# Patient Record
Sex: Female | Born: 1990 | Race: White | Hispanic: No | Marital: Married | Smoking: Never smoker
Health system: Southern US, Community
[De-identification: ages and names within clinical notes are randomized; demographics above are authoritative.]

## PROBLEM LIST (undated history)

## (undated) DIAGNOSIS — J45909 Unspecified asthma, uncomplicated: Secondary | ICD-10-CM

## (undated) DIAGNOSIS — Z9221 Personal history of antineoplastic chemotherapy: Secondary | ICD-10-CM

## (undated) DIAGNOSIS — F419 Anxiety disorder, unspecified: Secondary | ICD-10-CM

## (undated) DIAGNOSIS — I83813 Varicose veins of bilateral lower extremities with pain: Secondary | ICD-10-CM

## (undated) DIAGNOSIS — Z809 Family history of malignant neoplasm, unspecified: Secondary | ICD-10-CM

## (undated) DIAGNOSIS — C801 Malignant (primary) neoplasm, unspecified: Secondary | ICD-10-CM

## (undated) DIAGNOSIS — C50919 Malignant neoplasm of unspecified site of unspecified female breast: Secondary | ICD-10-CM

## (undated) DIAGNOSIS — F32A Depression, unspecified: Secondary | ICD-10-CM

## (undated) HISTORY — DX: Family history of malignant neoplasm, unspecified: Z80.9

## (undated) HISTORY — DX: Varicose veins of bilateral lower extremities with pain: I83.813

## (undated) HISTORY — DX: Malignant neoplasm of unspecified site of unspecified female breast: C50.919

---

## 2014-08-06 ENCOUNTER — Other Ambulatory Visit: Payer: Self-pay | Admitting: Family Medicine

## 2014-08-06 DIAGNOSIS — F411 Generalized anxiety disorder: Secondary | ICD-10-CM | POA: Insufficient documentation

## 2014-08-06 DIAGNOSIS — I8390 Asymptomatic varicose veins of unspecified lower extremity: Secondary | ICD-10-CM | POA: Insufficient documentation

## 2014-10-16 LAB — MONO SLIDE WITH REFLEX TO EBVP: Mono (Heterophile): NEGATIVE

## 2015-09-06 ENCOUNTER — Ambulatory Visit
Admission: AD | Admit: 2015-09-06 | Discharge: 2015-09-06 | Disposition: A | Discharge status: Home / Self Care | Payer: Self-pay | Attending: Family Medicine | Admitting: Family Medicine

## 2015-09-06 ENCOUNTER — Encounter: Payer: Self-pay | Admitting: Nurse Practitioner

## 2015-09-06 DIAGNOSIS — R197 Diarrhea, unspecified: Secondary | ICD-10-CM

## 2015-09-06 DIAGNOSIS — A084 Viral intestinal infection, unspecified: Secondary | ICD-10-CM

## 2015-09-06 DIAGNOSIS — R109 Unspecified abdominal pain: Secondary | ICD-10-CM

## 2015-09-06 LAB — HM HIV SCREENING OFFERED

## 2015-09-06 LAB — POCT URINE PREGNANCY: Lot #: 161321

## 2015-09-06 LAB — POCT URINALYSIS DIPSTICK
Glucose,UA POCT: NORMAL
Leuk Esterase,UA POCT: 1 — AB
Lot #: 20526703
Nitrite,UA POCT: NEGATIVE
PH,UA POCT: 5 (ref 5–8)
Specific gravity,UA POCT: 1.015 (ref 1.002–1.03)

## 2015-09-06 MED ORDER — DIPHENOXYLATE-ATROPINE 2.5-0.025 MG PO TABS *I*
1.0000 | ORAL_TABLET | Freq: Four times a day (QID) | ORAL | 0 refills | Status: DC | PRN
Start: 2015-09-06 — End: 2015-09-06

## 2015-09-06 MED ORDER — DIPHENOXYLATE-ATROPINE 2.5-0.025 MG PO TABS *I*
1.0000 | ORAL_TABLET | Freq: Four times a day (QID) | ORAL | 0 refills | Status: AC | PRN
Start: 2015-09-06 — End: 2015-09-09

## 2015-09-06 NOTE — UC Provider Note (Signed)
History     Chief Complaint   Patient presents with    Diarrhea     Pt. c/o diarrhea for the past 2 days whenever she eats/drinks anything     Patient is a 24 y.o. female presenting with GI illness.   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 Primary symptoms do not include fever, fatigue, nausea, vomiting or dysuria. The illness began 2 days ago. The onset was sudden. The problem has not changed since onset.  The abdominal pain began 2 days ago. The abdominal pain is generalized. The abdominal pain does not radiate. The abdominal pain is relieved by nothing.   The diarrhea began 2 days ago. The diarrhea is watery. The diarrhea occurs 5 to 10 times per day.   The illness does not include chills, anorexia, dysphagia, bloating, back pain or itching. Associated medical issues do not include GERD.       History reviewed. No pertinent past medical history.         History reviewed. No pertinent past surgical history.    History reviewed. No pertinent family history.      Social History    reports that she has never smoked. She does not have any smokeless tobacco history on file. Her alcohol, drug, and sexual activity histories are not on file.    Living Situation     Questions Responses    Patient lives with     Homeless     Caregiver for other family member     External Services     Employment     Domestic Violence Risk           Review of Systems   Review of Systems   Constitutional: Negative for activity change, appetite change, chills, fatigue and fever.   Gastrointestinal: Positive for abdominal pain and diarrhea. Negative for abdominal distention, anorexia, bloating, dysphagia, nausea and vomiting.   Genitourinary: Negative for difficulty urinating, dysuria, flank pain and vaginal discharge.   Musculoskeletal: Negative for back pain.   Skin:  Negative for itching.   Neurological: Negative for dizziness and headaches.   Psychiatric/Behavioral: Negative for agitation, behavioral problems and confusion.       Physical Exam     ED Triage Vitals   BP Heart Rate Heart Rate (via Pulse Ox) Resp Temp Temp src SpO2 O2 Device O2 Flow Rate   09/06/15 1030 09/06/15 1030 -- 09/06/15 1030 09/06/15 1030 09/06/15 1030 09/06/15 1030 09/06/15 1030 --   124/87 80  18 36.6 C (97.8 F) TEMPORAL 99 % None (Room air)       Weight           --                          Physical Exam   Constitutional: She appears well-developed and well-nourished.   Pulmonary/Chest: Effort normal and breath sounds normal.   Abdominal: Soft. Bowel sounds are normal. She exhibits no distension. There is generalized tenderness. There is no rigidity, no rebound, no guarding, no CVA tenderness, no tenderness at McBurney's point and negative Murphy's sign.   Psychiatric: She has a normal mood and affect. Her behavior is normal. Judgment and thought content normal.   Vitals reviewed.       Medical  Decision Making        Initial Evaluation:  ED First Provider Contact     Date/Time Event User Comments    09/06/15 1048 ED Provider First Contact [place]O, Tajae Rybicki Initial Face to Face Provider Contact          Patient seen by me on arrival date of 09/06/2015 at at time of arrival  10:19 AM.  Initial face to face evaluation time noted above may be discrepant due to patient acuity and delay in documentation.    Assessment:  24 y.o.female comes [facility] the [facility] with diarrhea    Differential Diagnosis includes   Appendicitis  Diverticulitis  Diverticulosis  AVM  Mesenteric ischemia  Gastroenteritis  GERD  Pseudomembranous colitis    Plan:   Orders Placed This Encounter    Bacterial urine culture    POCT urinalysis dipstick    POCT urine pregnancy    HM HIV SCREENING OFFERED    diphenoxylate-atropine (LOMOTIL) 2.5-0.025 MG per tablet   Pregnancy - Urine Pregnancy test ordered to guide diagnostics  and/or medication therapy in patient of child bearing age  Urine sent for culture secondary to one plus leuk.  Possible asymptomatic bacteriuria.    Final Diagnosis    ICD-10-CM ICD-9-CM   1. Abdominal cramping R10.9 789.00   2. Diarrhea, unspecified type R19.7 787.91   3. Viral gastroenteritis A08.4 008.8       Encourage fluids, encourage rest, good hand hygiene.    Use over the counter medications as discussed.    Please start the new medications as below:    Current Discharge Medication List      New Medications    Details Last Dose Given Next Dose Due Script Given?   diphenoxylate-atropine (LOMOTIL) 1 tablet Dose: 1 tablet  Take 1 tablet by mouth 4 times daily as needed for Diarrhea    Quantity 12 tablet, Refill 0  Start date: 09/06/2015, End date: 09/09/2015                   Please follow up with your physician as below:    Follow-up Information     Follow up with Jacqulyn LinerFoote, Michael M, MD.    Why:  As needed    Contact information:    [address]  [address]  [place] [id number]  [phone]          If short of breath, chest pains or any other concerns please report to the emergency room.    In the event of an Emergency dial 911.      Final Diagnosis  Final diagnoses:   [R10.9] Abdominal cramping (Primary)   [R19.7] Diarrhea, unspecified type   [A08.4] Viral gastroenteritis           Lissa MoralesMATTHEW Mane Consolo, MD       Lissa Moralesapuano, Peg Fifer, MD  09/06/15 1118

## 2015-09-07 LAB — AEROBIC CULTURE: Aerobic Culture: 0

## 2015-10-03 ENCOUNTER — Other Ambulatory Visit: Payer: Self-pay | Admitting: Emergency Medicine

## 2015-10-03 LAB — URINALYSIS WITH REFLEX TO MICROSCOPIC
Bilirubin,Ur: NEGATIVE
Blood,UA: NEGATIVE
Glucose, Ur: NEGATIVE mg/dL^mg/dL
Ketones, UA: NEGATIVE mg/dL^mg/dL
Leuk Esterase,UA: NEGATIVE
Nitrite,UA: NEGATIVE
Protein,UA: NEGATIVE mg/dL^mg/dL
Specific Gravity,UA: 1.003 — ABNORMAL LOW (ref 1.005–1.030)
Urobilinogen,UA: 0.2 mg/dL^mg/dL
pH: 6.5 (ref 5.0–8.0)

## 2015-10-03 LAB — COMPREHENSIVE METABOLIC PANEL
ALT: 29 U/L^U/L (ref 12–78)
AST: 22 U/L^U/L (ref 11–37)
Albumin: 3.9 g/dL^g/dL (ref 3.4–5.0)
Alk Phos: 56 U/L^U/L (ref 45–117)
Anion Gap: 6 meq/L^meq/L (ref 3–11)
Bilirubin,Total: 0.5 mg/dL^mg/dL (ref 0.2–1.0)
CO2: 28 meq/L^meq/L (ref 21–32)
Calcium: 8.6 mg/dL^mg/dL (ref 8.5–10.1)
Chloride: 109 meq/L^meq/L — ABNORMAL HIGH (ref 98–107)
Creatinine: 0.8 mg/dL^mg/dL (ref 0.6–1.3)
GFR,Black: 106 mL/min/{1.73_m2}
GFR,Caucasian: 87 mL/min/{1.73_m2}
Glucose: 76 mg/dL^mg/dL (ref 70–100)
Lab: 14 mg/dL^mg/dL (ref 7–18)
Potassium: 3.8 meq/L^meq/L (ref 3.5–5.1)
Sodium: 143 meq/L^meq/L (ref 136–145)
Total Protein: 7 g/dL^g/dL (ref 6.4–8.2)

## 2015-10-03 LAB — CBC AND DIFFERENTIAL
Baso # K/uL: 0.1 10*3/uL (ref 0.0–0.1)
Basophil %: 0.7 %^% (ref 0.1–1.2)
Eos # K/uL: 0.1 10*3/uL (ref 0.0–0.4)
Eosinophil %: 1.1 %^% (ref 0.7–5.8)
Hematocrit: 38.5 %^% (ref 34.1–44.9)
Hemoglobin: 13.5 g/dL^g/dL (ref 11.2–15.7)
Immature Granulocytes Absolute: 0.03 10*3/uL (ref 0.0–0.2)
Immature Granulocytes: 0.3 %^% (ref 0.0–2.0)
Lymph # K/uL: 2.6 10*3/uL (ref 1.2–3.7)
Lymphocyte %: 29.6 %^% (ref 19.3–51.7)
MCH: 31.6 pg^pg (ref 25.6–32.2)
MCHC: 35.1 g/dL^g/dL (ref 32.2–35.5)
MCV: 90.2 fL^fL (ref 79.4–94.8)
Mono # K/uL: 0.6 10*3/uL (ref 0.2–0.9)
Monocyte %: 6.3 %^% (ref 4.7–12.5)
Neut # K/uL: 5.5 10*3/uL (ref 1.6–6.0)
Nucl RBC # K/uL: 0 /100 WBC^/100 WBC (ref 0.0–0.2)
Platelets: 178 10*3/uL — ABNORMAL LOW (ref 182–369)
RBC Distribution Width-SD: 40.1 fL^fL (ref 36.4–46.3)
RBC: 4.27 10*6/uL (ref 3.93–5.22)
RDW: 12.3 %^% (ref 11.7–14.4)
Seg Neut %: 62 %^% (ref 34.0–71.1)
WBC: 8.8 10*3/uL (ref 4.0–10.0)

## 2015-10-03 LAB — INR FFT: INR: 1.04 (ref 0.86–1.16)

## 2015-10-03 LAB — LIPASE: Lipase: 111 U/L^U/L (ref 73–393)

## 2015-10-03 LAB — TYPE AND SCREEN
ABO RH Blood Type: O POS
Antibody Screen: NEGATIVE

## 2015-10-03 LAB — APTT: aPTT: 32.4 seconds^seconds (ref 26.8–34.4)

## 2015-10-03 LAB — ALCOHOL SCREEN: Ethanol: 141 mg/dL^mg/dL — ABNORMAL HIGH (ref 0–3)

## 2015-10-03 LAB — PREGNANCY TEST, SERUM: Preg,Serum: NEGATIVE

## 2016-07-07 ENCOUNTER — Other Ambulatory Visit: Payer: Self-pay | Admitting: Family Medicine

## 2016-07-07 LAB — COMPREHENSIVE METABOLIC PANEL
ALT: 15 U/L (ref 0–35)
AST: 20 U/L (ref 0–35)
Albumin: 3.9 g/dL (ref 3.5–5.2)
Alk Phos: 43 U/L (ref 35–105)
Anion Gap: 18 ^^L — ABNORMAL HIGH (ref 7–16)
Bilirubin,Total: 0.2 mg/dL (ref 0.0–1.2)
CO2: 16 mmol/L — ABNORMAL LOW (ref 20–28)
Calcium: 8.7 mg/dL — ABNORMAL LOW (ref 8.8–10.2)
Chloride: 106 mmol/L (ref 96–108)
Creatinine: 0.64 mg/dL (ref 0.51–0.95)
GFR,Black: 136 mL/min/{1.73_m2}
GFR,Caucasian: 112 mL/min/{1.73_m2}
Glucose: 121 mg/dL — ABNORMAL HIGH (ref 60–99)
Lab: 11 mg/dL (ref 6–20)
Potassium: 4.2 mmol/L (ref 3.4–4.7)
Sodium: 140 mmol/L (ref 133–145)
Total Protein: 6.7 g/dL (ref 6.3–7.7)

## 2016-07-07 LAB — CBC AND DIFFERENTIAL
Baso # K/uL: 0.1 10*3/uL (ref 0.0–0.1)
Basophil %: 0.8 % (ref 0.1–1.2)
Eos # K/uL: 0.1 10*3/uL (ref 0.0–0.4)
Eosinophil %: 1.9 % (ref 0.7–5.8)
Hematocrit: 37.7 % (ref 34.1–44.9)
Hemoglobin: 13.1 g/dL (ref 11.2–15.7)
Immature Granulocytes Absolute: 0.01 10*3/uL (ref 0.0–0.2)
Immature Granulocytes: 0.2 % (ref 0.0–2.0)
Lymph # K/uL: 1.4 10*3/uL (ref 1.2–3.7)
Lymphocyte %: 22.4 % (ref 19.3–51.7)
MCH: 31.5 pg (ref 25.6–32.2)
MCHC: 34.7 g/dL (ref 32.2–35.5)
MCV: 90.6 fL (ref 79.4–94.8)
Mono # K/uL: 0.4 10*3/uL (ref 0.2–0.9)
Monocyte %: 5.7 % (ref 4.7–12.5)
Neut # K/uL: 4.4 10*3/uL (ref 1.6–6.0)
Nucl RBC # K/uL: 0 /100 WBC (ref 0.0–0.2)
Platelets: 197 10*3/uL (ref 182–369)
RBC Distribution Width-SD: 43.8 fL (ref 36.4–46.3)
RBC: 4.16 10*6/uL (ref 3.93–5.22)
RDW: 13.1 % (ref 11.7–14.4)
Seg Neut %: 69 % (ref 34.0–71.1)
WBC: 6.3 10*3/uL (ref 4.0–10.0)

## 2016-07-07 LAB — SEDIMENTATION RATE, AUTOMATED: Sedimentation Rate: 2 mm/hr (ref 0–20)

## 2016-07-07 LAB — TSH: TSH: 1.28 u[IU]/mL (ref 0.27–4.20)

## 2016-07-07 LAB — T4, FREE: Free T4: 1.01 ng/dL (ref 0.90–1.70)

## 2016-07-09 LAB — ANTINUCLEAR ANTIBODY SCREEN: ANA Screen: NEGATIVE ^^L

## 2016-07-10 LAB — VITAMIN D
25-OH VIT D2: <4 ng/mL
25-OH VIT D3: 39 ng/mL
25-OH Vit Total: 39 ng/mL (ref 30–60)

## 2016-08-31 ENCOUNTER — Ambulatory Visit
Admission: AD | Admit: 2016-08-31 | Discharge: 2016-08-31 | Disposition: A | Discharge status: Home / Self Care | Payer: Self-pay

## 2016-08-31 DIAGNOSIS — K529 Noninfective gastroenteritis and colitis, unspecified: Secondary | ICD-10-CM

## 2016-08-31 MED ORDER — ONDANSETRON HCL 4 MG PO TABS *I*
4.0000 mg | ORAL_TABLET | Freq: Three times a day (TID) | ORAL | 0 refills | Status: DC | PRN
Start: 2016-08-31 — End: 2016-12-15

## 2016-08-31 NOTE — UC Provider Note (Signed)
History     Chief Complaint   Patient presents with    Emesis     Vomiting, sweats, nausea last night. States nosymptoms toda. States "I am just looking for a doctors note for work".     Patient is a 25 y.o. female presenting with vomiting.   History provided by:  Patient  Language interpreter used: No    Emesis   Severity:  Mild  Duration:  12 hours  Timing:  Intermittent  Quality:  Stomach contents  Able to tolerate:  Liquids  Progression:  Improving  Chronicity:  New  Recent urination:  Normal  Relieved by:  None tried  Worsened by:  Nothing  Associated symptoms: headaches    Associated symptoms: no abdominal pain, no arthralgias, no chills, no cough, no diarrhea, no fever, no myalgias, no sore throat and no URI    Risk factors: suspect food intake (patient ate a mushroom stew yesterday that she suspects made her sick)    LMP was normal.     History reviewed. No pertinent past medical history.         History reviewed. No pertinent surgical history.    History reviewed. No pertinent family history.      Social History    reports that she has never smoked. She has never used smokeless tobacco. Her alcohol, drug, and sexual activity histories are not on file.    Living Situation     Questions Responses    Patient lives with     Homeless     Caregiver for other family member     External Services     Employment Employed    Domestic Violence Risk           Review of Systems   Review of Systems   Constitutional: Negative for chills, diaphoresis, fatigue and fever.   HENT: Negative for sore throat.    Respiratory: Negative for cough.    Gastrointestinal: Positive for nausea and vomiting. Negative for abdominal pain and diarrhea.   Genitourinary: Negative for decreased urine volume, difficulty urinating and menstrual problem.   Musculoskeletal: Negative for arthralgias and myalgias.   Neurological: Positive for headaches. Negative for dizziness and light-headedness.   All other systems reviewed and are  negative.      Physical Exam     ED Triage Vitals   BP Heart Rate Heart Rate (via Pulse Ox) Resp Temp Temp src SpO2 (Retired) O2 Device O2 Flow Rate   08/31/16 1150 08/31/16 1150 -- 08/31/16 1150 08/31/16 1150 -- 08/31/16 1150 -- --   109/59 67  18 36.6 C (97.9 F)  99 %        Weight           --                               Physical Exam   Constitutional: She is oriented to person, place, and time. She appears well-developed and well-nourished. No distress.   HENT:   Head: Normocephalic and atraumatic.   Mouth/Throat: Oropharynx is clear and moist.   Eyes: Conjunctivae and EOM are normal. Pupils are equal, round, and reactive to light.   Neck: Normal range of motion. Neck supple. No JVD present.   Cardiovascular: Normal rate, regular rhythm, normal heart sounds and intact distal pulses.  Exam reveals no gallop and no friction rub.    No murmur heard.  Pulmonary/Chest: Effort normal and  breath sounds normal. No stridor. No respiratory distress.   Abdominal: Soft. Bowel sounds are normal. She exhibits no distension and no mass. There is no tenderness. There is no rebound, no guarding and no CVA tenderness.   Lymphadenopathy:     She has no cervical adenopathy.   Neurological: She is alert and oriented to person, place, and time.   Skin: Skin is warm and dry. No pallor.   Psychiatric: She has a normal mood and affect. Her behavior is normal. Judgment and thought content normal.   Nursing note and vitals reviewed.       Medical Decision Making        Initial Evaluation:  ED First Provider Contact     Date/Time Event User Comments    08/31/16 1153 ED First Provider Contact Alysia Penna M Initial Face to Face Provider Contact          Patient was seen on: 08/31/2016        Assessment:  25 y.o.female comes to the Urgent Care Center with nausea and vomiting x 12 hours after eating a mushroom stew for dinner.     Differential Diagnosis includes gastroenteritis, pregnancy, IBD/IBS, dehydration                Plan:   Orders  Placed This Encounter    ondansetron (ZOFRAN) 4 MG tablet       No results found for this or any previous visit (from the past 24 hour(s)).       Final Diagnosis    ICD-10-CM ICD-9-CM   1. Gastroenteritis K52.9 558.9       Encourage fluids, encourage rest, good hand hygiene.    Use over the counter medications as discussed.     Discharge Instructions    References/Attachments: Go to References/Attachments    COLITIS (ENGLISH) View    FOOD CHOICES TO HELP RELIEVE DIARRHEA, ADULT (ENGLISH) View    CLEAR LIQUID DIET (ENGLISH) View    Patient's Language: English   Instructions      Recommend otc Tylenol and/or Motrin as needed for pain/fever relief. Use as directed on package labeling.     Recommend OTC Pepto bismol.         Work excuse x today.    Please start the new medications as below:    Current Discharge Medication List      New Medications    Details Last Dose Given Next Dose Due Script Given?   ondansetron (ZOFRAN) 4 mg Dose: 4 mg  Take 4 mg by mouth 3 times daily as needed for Nausea    Quantity 15 tablet, Refill 0  Start date: 08/31/2016       Comments: Emergency Encounter                   Please follow up with your physician as below:    Follow-up Information     Follow up with Jacqulyn Liner, MD In 1 day.    Specialty:  Family Medicine    Why:  If symptoms worsen, If symptoms do not improve    Contact information:    [address]  [address]  [place] [address]  [phone]          If short of breath, chest pains, worsening symptoms, or any other concerns please report to the emergency room.    In the event of an Emergency dial 911.     Final Diagnosis  Final diagnoses:   [B14.9]  Gastroenteritis (Primary)           Concha PyoLeah M Delorese Sellin, [place]    Supervising physician Dorna BloomSuzanne Brendze, MD was immediately available     Concha PyoRuiz, Keegen Heffern M, [place]  08/31/16 1214

## 2016-08-31 NOTE — Discharge Instructions (Signed)
Recommend otc Tylenol and/or Motrin as needed for pain/fever relief. Use as directed on package labeling.     Recommend OTC Pepto bismol.

## 2016-09-06 ENCOUNTER — Telehealth: Payer: Self-pay

## 2016-09-06 NOTE — Telephone Encounter (Signed)
[facility]     Visit location: [facility]  Date of service: 08/31/2016    Condition of patient: N/A    Patient seeking follow-up with PCP or specialist: N/A    Prescriptions filled: n/a    Did ED/UC staff take the time to listen to patient concerns: n/a    Did ED/UC staff keep the patient informed: n/a    Other questions/concerns voiced by patient: N/A    MyChart-offer to assist patient with instructions: n/a    Rocky Morelachel M Cylie Dor on 09/06/2016 at 12:05 PM      PAST 5 DAY CALL BACK PERIOD

## 2016-10-05 HISTORY — PX: APPENDECTOMY: SHX54

## 2016-10-29 ENCOUNTER — Observation Stay
Admission: EM | Admit: 2016-10-29 | Disposition: A | Discharge status: Home / Self Care | Payer: Self-pay | Source: Ambulatory Visit | Attending: Surgery | Admitting: Surgery

## 2016-10-29 ENCOUNTER — Encounter
Admission: EM | Disposition: A | Discharge status: Home / Self Care | Payer: Self-pay | Source: Ambulatory Visit | Attending: Surgery

## 2016-10-29 ENCOUNTER — Encounter: Payer: Self-pay | Admitting: Anesthesiology

## 2016-10-29 ENCOUNTER — Other Ambulatory Visit: Payer: Self-pay | Admitting: Emergency Medicine

## 2016-10-29 ENCOUNTER — Encounter: Payer: Self-pay | Admitting: Emergency Medicine

## 2016-10-29 LAB — CBC AND DIFFERENTIAL
Baso # K/uL: 0.1 10*3/uL (ref 0.0–0.1)
Basophil %: 0.3 %
Eos # K/uL: 0.1 10*3/uL (ref 0.0–0.4)
Eosinophil %: 0.6 %
Hematocrit: 41 % (ref 34–45)
Hemoglobin: 14 g/dL (ref 11.2–15.7)
IMM Granulocytes #: 0.1 10*3/uL (ref 0.0–0.1)
IMM Granulocytes: 0.3 %
Lymph # K/uL: 1.2 10*3/uL (ref 1.2–3.7)
Lymphocyte %: 7.4 %
MCH: 32 pg/cell (ref 26–32)
MCHC: 34 g/dL (ref 32–36)
MCV: 92 fL (ref 79–95)
Mono # K/uL: 1 10*3/uL — ABNORMAL HIGH (ref 0.2–0.9)
Monocyte %: 6.3 %
Neut # K/uL: 13.2 10*3/uL — ABNORMAL HIGH (ref 1.6–6.1)
Nucl RBC # K/uL: 0 10*3/uL (ref 0.0–0.0)
Nucl RBC %: 0 /100 WBC (ref 0.0–0.2)
Platelets: 204 10*3/uL (ref 160–370)
RBC: 4.5 MIL/uL (ref 3.9–5.2)
RDW: 12.8 % (ref 11.7–14.4)
Seg Neut %: 85.1 %
WBC: 15.5 10*3/uL — ABNORMAL HIGH (ref 4.0–10.0)

## 2016-10-29 LAB — RUQ PANEL (ED ONLY)
ALT: 27 U/L (ref 0–35)
Albumin: 4.8 g/dL (ref 3.5–5.2)
Alk Phos: 44 U/L (ref 35–105)
Amylase: 49 U/L (ref 28–100)
Bilirubin,Direct: <0.2 mg/dL (ref 0.0–0.3)
Bilirubin,Total: 0.9 mg/dL (ref 0.0–1.2)
Lipase: 20 U/L (ref 13–60)
Total Protein: 7.6 g/dL (ref 6.3–7.7)

## 2016-10-29 LAB — URINALYSIS REFLEX TO CULTURE
Blood,UA: NEGATIVE
Ketones, UA: NEGATIVE
Leuk Esterase,UA: NEGATIVE
Nitrite,UA: NEGATIVE
Protein,UA: NEGATIVE mg/dL
Specific Gravity,UA: 1.013 (ref 1.002–1.030)
pH,UA: 7 (ref 5.0–8.0)

## 2016-10-29 LAB — BASIC METABOLIC PANEL
Anion Gap: 16 (ref 7–16)
CO2: 22 mmol/L (ref 20–28)
Calcium: 9.6 mg/dL (ref 8.8–10.2)
Chloride: 100 mmol/L (ref 96–108)
Creatinine: 0.58 mg/dL (ref 0.51–0.95)
GFR,Black: 146 *
GFR,Caucasian: 127 *
Glucose: 83 mg/dL (ref 60–99)
Lab: 9 mg/dL (ref 6–20)
Sodium: 138 mmol/L (ref 133–145)

## 2016-10-29 LAB — POCT URINE PREGNANCY

## 2016-10-29 LAB — POCT URINALYSIS DIPSTICK
Bilirubin,Ur: NEGATIVE
Blood,UA POCT: NEGATIVE
Glucose,UA POCT: NORMAL mg/dL
Ketones,UA POCT: NEGATIVE mg/dL
Leuk Esterase,UA POCT: NEGATIVE
Lot #: 21650803
Nitrite,UA POCT: NEGATIVE
PH,UA POCT: 8 (ref 5–8)
Protein,UA POCT: NEGATIVE mg/dL
Specific gravity,UA POCT: 1.005 (ref 1.002–1.030)
Urobilinogen,UA: NORMAL mg/dL

## 2016-10-29 LAB — HOLD GREEN WITH GEL

## 2016-10-29 LAB — POCT GLUCOSE: Glucose POCT: 86 mg/dL (ref 60–99)

## 2016-10-29 LAB — HEMOLYZED TEST

## 2016-10-29 SURGERY — APPENDECTOMY, LAPAROSCOPIC
Anesthesia: General | Site: Abdomen | Wound class: Clean Contaminated

## 2016-10-29 MED ORDER — FENTANYL CITRATE 50 MCG/ML IJ SOLN *WRAPPED*
INTRAMUSCULAR | Status: AC
Start: 2016-10-29 — End: 2016-10-29
  Filled 2016-10-29: qty 2

## 2016-10-29 MED ORDER — SODIUM CHLORIDE 0.9 % IV BOLUS *I*
500.0000 mL | Freq: Once | Status: DC
Start: 2016-10-29 — End: 2016-10-29

## 2016-10-29 MED ORDER — HYDROMORPHONE HCL 2 MG/ML IJ SOLN *WRAPPED*
0.5000 mg | INTRAMUSCULAR | Status: DC | PRN
Start: 2016-10-29 — End: 2016-10-30

## 2016-10-29 MED ORDER — SUGAMMADEX SODIUM 100 MG/1ML IV SOLN *WRAPPED*
INTRAVENOUS | Status: AC
Start: 2016-10-29 — End: 2016-10-29
  Filled 2016-10-29: qty 2

## 2016-10-29 MED ORDER — HYDROMORPHONE HCL 2 MG/ML IJ SOLN *WRAPPED*
0.5000 mg | INTRAMUSCULAR | Status: DC | PRN
Start: 2016-10-29 — End: 2016-10-30
  Administered 2016-10-29: 0.5 mg via INTRAVENOUS
  Filled 2016-10-29: qty 1

## 2016-10-29 MED ORDER — SODIUM CHLORIDE 0.9 % IV BOLUS *I*
1000.0000 mL | Freq: Once | Status: AC
Start: 2016-10-29 — End: 2016-10-29
  Administered 2016-10-29: 1000 mL via INTRAVENOUS

## 2016-10-29 MED ORDER — AZITHROMYCIN 250 MG PO TABS *I*
1000.0000 mg | ORAL_TABLET | Freq: Once | ORAL | Status: AC
Start: 2016-10-29 — End: 2016-10-29
  Administered 2016-10-29: 1000 mg via ORAL
  Filled 2016-10-29: qty 4

## 2016-10-29 MED ORDER — FENTANYL CITRATE 50 MCG/ML IJ SOLN *WRAPPED*
INTRAMUSCULAR | Status: DC | PRN
Start: 2016-10-29 — End: 2016-10-29
  Administered 2016-10-29: 23:00:00 25 ug via INTRAVENOUS
  Administered 2016-10-29: 22:00:00 100 ug via INTRAVENOUS
  Administered 2016-10-29: 50 ug via INTRAVENOUS
  Administered 2016-10-29: 25 ug via INTRAVENOUS

## 2016-10-29 MED ORDER — CEFTRIAXONE IN LIDOCAINE 1% (350MG/ML) 250 MG *I*
250.0000 mg | Freq: Once | INTRAMUSCULAR | Status: AC
Start: 2016-10-29 — End: 2016-10-29
  Administered 2016-10-29: 250 mg via INTRAMUSCULAR
  Filled 2016-10-29: qty 0.71

## 2016-10-29 MED ORDER — SODIUM CHLORIDE 0.9 % IV SOLN WRAPPED *I*
2000.0000 mg | INTRAVENOUS | Status: AC
Start: 2016-10-29 — End: 2016-10-29
  Administered 2016-10-29: 2000 mg via INTRAVENOUS
  Filled 2016-10-29: qty 2

## 2016-10-29 MED ORDER — LACTATED RINGERS IV SOLN *I*
INTRAVENOUS | Status: DC | PRN
Start: 2016-10-29 — End: 2016-10-29

## 2016-10-29 MED ORDER — ONDANSETRON HCL 2 MG/ML IV SOLN *I*
4.0000 mg | Freq: Four times a day (QID) | INTRAMUSCULAR | Status: DC | PRN
Start: 2016-10-29 — End: 2016-10-30

## 2016-10-29 MED ORDER — FENTANYL CITRATE 50 MCG/ML IJ SOLN *WRAPPED*
100.0000 ug | Freq: Once | INTRAMUSCULAR | Status: AC
Start: 2016-10-29 — End: 2016-10-29
  Administered 2016-10-29: 15:00:00 100 ug via INTRAVENOUS
  Filled 2016-10-29: qty 2

## 2016-10-29 MED ORDER — CEFOXITIN SODIUM 1 GM IV SOLR *I*
2000.0000 mg | Freq: Once | INTRAVENOUS | Status: AC
Start: 2016-10-29 — End: 2016-10-29
  Administered 2016-10-29: 2000 mg via INTRAVENOUS
  Filled 2016-10-29: qty 2

## 2016-10-29 MED ORDER — DEXAMETHASONE SODIUM PHOSPHATE 4 MG/ML INJ SOLN *WRAPPED*
INTRAMUSCULAR | Status: DC | PRN
Start: 2016-10-29 — End: 2016-10-29
  Administered 2016-10-29: 8 mg via INTRAVENOUS

## 2016-10-29 MED ORDER — KETOROLAC TROMETHAMINE 30 MG/ML IJ SOLN *I*
30.0000 mg | Freq: Once | INTRAMUSCULAR | Status: AC
Start: 2016-10-29 — End: 2016-10-29
  Administered 2016-10-29: 30 mg via INTRAVENOUS
  Filled 2016-10-29: qty 1

## 2016-10-29 MED ORDER — SUCCINYLCHOLINE CHLORIDE 20 MG/ML IV/IJ SOLN *WRAPPED*
Status: DC | PRN
Start: 2016-10-29 — End: 2016-10-29
  Administered 2016-10-29: 100 mg via INTRAVENOUS

## 2016-10-29 MED ORDER — ROCURONIUM BROMIDE 10 MG/ML IV SOLN *WRAPPED*
Status: DC | PRN
Start: 2016-10-29 — End: 2016-10-29
  Administered 2016-10-29: 50 mg via INTRAVENOUS

## 2016-10-29 MED ORDER — MIDAZOLAM HCL 1 MG/ML IJ SOLN *I* WRAPPED
INTRAMUSCULAR | Status: AC
Start: 2016-10-29 — End: 2016-10-29
  Filled 2016-10-29: qty 2

## 2016-10-29 MED ORDER — BUPIVACAINE HCL 0.5 % IJ SOLUTION *WRAPPED*
INTRAMUSCULAR | Status: DC | PRN
Start: 2016-10-29 — End: 2016-10-29
  Administered 2016-10-29: 20 mL via SUBCUTANEOUS

## 2016-10-29 MED ORDER — PLASMA-LYTE IV SOLN *WRAPPED*
Status: DC | PRN
Start: 2016-10-29 — End: 2016-10-29

## 2016-10-29 MED ORDER — SODIUM CHLORIDE 0.9 % IV SOLN WRAPPED *I*
100.0000 mL/h | Status: DC
Start: 2016-10-29 — End: 2016-10-29
  Administered 2016-10-29: 100 mL/h via INTRAVENOUS

## 2016-10-29 MED ORDER — CITALOPRAM HYDROBROMIDE 40 MG PO TABS *I*
40.0000 mg | ORAL_TABLET | Freq: Every day | ORAL | Status: DC
Start: 2016-10-29 — End: 2016-10-30
  Administered 2016-10-30: 40 mg via ORAL
  Filled 2016-10-29 (×2): qty 1

## 2016-10-29 MED ORDER — ONDANSETRON HCL 2 MG/ML IV SOLN *I*
4.0000 mg | Freq: Once | INTRAMUSCULAR | Status: AC
Start: 2016-10-29 — End: 2016-10-29
  Administered 2016-10-29: 4 mg via INTRAVENOUS
  Filled 2016-10-29: qty 2

## 2016-10-29 MED ORDER — PROPOFOL 10 MG/ML IV EMUL (INTERMITTENT DOSING) WRAPPED *I*
INTRAVENOUS | Status: DC | PRN
Start: 2016-10-29 — End: 2016-10-29
  Administered 2016-10-29: 150 mg via INTRAVENOUS

## 2016-10-29 MED ORDER — KETOROLAC TROMETHAMINE 30 MG/ML IJ SOLN *I*
30.0000 mg | Freq: Once | INTRAMUSCULAR | Status: DC
Start: 2016-10-29 — End: 2016-10-29

## 2016-10-29 MED ORDER — STERILE WATER FOR IRRIGATION IR SOLN *I*
900.0000 mL | Freq: Once | Status: AC
Start: 2016-10-29 — End: 2016-10-29
  Administered 2016-10-29: 900 mL via ORAL

## 2016-10-29 MED ORDER — INFLUENZA VAC SPLIT QUAD (FLULAVAL) 0.5 ML IM SUSY PF(>=6 MONTHS) *I*
0.5000 mL | PREFILLED_SYRINGE | Freq: Once | INTRAMUSCULAR | Status: AC
Start: 2016-10-29 — End: 2016-10-29
  Administered 2016-10-29: 0.5 mL via INTRAMUSCULAR
  Filled 2016-10-29: qty 0.5

## 2016-10-29 MED ORDER — MIDAZOLAM HCL 1 MG/ML IJ SOLN *I* WRAPPED
INTRAMUSCULAR | Status: DC | PRN
Start: 2016-10-29 — End: 2016-10-29
  Administered 2016-10-29: 2 mg via INTRAVENOUS

## 2016-10-29 MED ORDER — ONDANSETRON HCL 2 MG/ML IV SOLN *I*
4.0000 mg | Freq: Once | INTRAMUSCULAR | Status: DC
Start: 2016-10-29 — End: 2016-10-29

## 2016-10-29 MED ORDER — ONDANSETRON HCL 2 MG/ML IV SOLN *I*
INTRAMUSCULAR | Status: DC | PRN
Start: 2016-10-29 — End: 2016-10-29
  Administered 2016-10-29: 4 mg via INTRAMUSCULAR

## 2016-10-29 MED ORDER — PIPERACILLIN-TAZOBACTAM IN D5W 4.5 GM/100ML IV SOLN *I*
4.5000 g | Freq: Once | INTRAVENOUS | Status: DC
Start: 2016-10-29 — End: 2016-10-29

## 2016-10-29 MED ORDER — SODIUM CHLORIDE 0.9 % IV SOLN WRAPPED *I*
100.0000 mL/h | Status: AC
Start: 2016-10-30 — End: 2016-10-30
  Administered 2016-10-30: 100 mL/h via INTRAVENOUS

## 2016-10-29 MED ORDER — IOHEXOL 350 MG/ML (OMNIPAQUE) IV SOLN *I*
114.0000 mL | Freq: Once | INTRAVENOUS | Status: AC
Start: 2016-10-29 — End: 2016-10-29
  Administered 2016-10-29: 114 mL via INTRAVENOUS

## 2016-10-29 MED ORDER — LIDOCAINE HCL 2 % IJ SOLN *I*
INTRAMUSCULAR | Status: DC | PRN
Start: 2016-10-29 — End: 2016-10-29
  Administered 2016-10-29: 100 mg via INTRAVENOUS

## 2016-10-29 SURGICAL SUPPLY — 36 items
ADHESIVE SKIN CLOSURE 0.7ML DERMABOND ADVANCED (Dressing) ×2 IMPLANT
BAG SPEC RETRV 224ML W4XL6IN DIA10MM ENDOPCH RETRV (Supply) IMPLANT
BAG ZIPLOCK BIOHAZ W/POUCH 6X9 USE 246168 (Supply) ×2 IMPLANT
CONTAINER SPEC LEAKPRF 3 OZ (Supply) ×2 IMPLANT
COVER OR LIGHT DISP CAMERA (Supply) IMPLANT
ENDO CLOSE USSC-TROCAR SITE CL (Supply) IMPLANT
GLOVE BIOGEL PI MICRO SZ 6.5 (Glove) ×2 IMPLANT
GLOVE SURG GAMMEX DERMAPRENE ULTRA SZ6.5 PF LF (Glove) ×2 IMPLANT
GOWN PREVENTION PLUS XL (Gown) ×2 IMPLANT
GRASPER BABCOCK ENDOPATH 5MM (Supply) ×2 IMPLANT
HANDPIECE S/I 5MM X 32CM CANNULA DISP (Supply) ×2 IMPLANT
INSTR LIGASURE MARYLAND NANOCOATED 37CM (Supply) ×2 IMPLANT
KIT LAPAROSCOPIC TO OPEN (Supply) IMPLANT
NEEDLE PNEUMOPERITONEUM 120 (Needle) ×2 IMPLANT
PACK CUSTOM GENERAL LAPAROSCOPY (Pack) ×2 IMPLANT
POUCH ENDO 10MM (Supply)
RELOAD ENDO REG 45MM WHITE (Supply) ×2 IMPLANT
RELOAD STPL H1.5-3.5XL45MM REG TISS BLU 6 ROW FOR B FRM SGL STROKE FIRING DEV TECH (Supply) IMPLANT
SCISSORS ENDO DISP (Supply)
SCISSORS ES TIP L19.3MM DISP ENDOCUT (Supply) IMPLANT
SLEEVE COMP KNEE HI MED (Supply) ×2 IMPLANT
SLEEVE XCEL ENDOPATH 5 X 100MM (Other) ×2 IMPLANT
SOL SOD CHL IRRIG .9PCT 1000ML BAG (Solution) ×4 IMPLANT
SOL SOD CHL IRRIG 500ML BTL (Solution) ×2 IMPLANT
SPIKE MINI DISPENSING PIN (BBRAUN DP1000) (Supply) ×2 IMPLANT
STAPLE RELOAD 45BL (Supply)
STAPLER ARTICULATING 45MM (Supply) ×2 IMPLANT
SUTR MONCRYL 4-0 PS-2 UND 27IN (Suture) ×4 IMPLANT
SUTR VICRYL 0 TIE CT 18 IN UNDY (Suture) IMPLANT
SUTR VICRYL 0 UR-6 CT 27 IN (Suture) IMPLANT
SUTR VICRYL ANTIB 0 UR-6 27 VIOLET (Suture) ×2 IMPLANT
SYRINGE LUERLOCK 30ML INDIVIDUAL WRAP (Supply) ×2 IMPLANT
SYRINGE LUERLOCK CNTL 10CC (Supply) ×2 IMPLANT
TIP TROCAR XCEL BLUNT12MM (Other) IMPLANT
TROCAR XCEL ENDOPATH BLADELESS 12X100MM (Other) ×2 IMPLANT
TROCAR XCEL ENDOPATH BLADELESS 5X100MM (Other) ×2 IMPLANT

## 2016-10-29 NOTE — ED Provider Notes (Signed)
History     Chief Complaint   Patient presents with    Abdominal Pain     HPI Comments: Patient is a 26 year old female with right lower quadrant pain that has been going on since about noon while cleaning at home.  She states the pain came on suddenly and has been persistent maybe a little improved but currently a 7/10 crampy type pain without radiation.  She has vomited about 5 times and now has dry heaves.  She does have a history of ovarian cysts but does not recall similar pain.  No known fevers.  No diarrhea but softer stool yesterday.  She is taking birth control and does not get periods because of this but is sexually active.  No vaginal discharge.  She does follow gyn but has not seen them in about a year. She has no history of STD and has never been pregnant.  No burning with urination or pain with urination.    History reviewed. No pertinent past medical history.     History reviewed. No pertinent surgical history.  History reviewed. No pertinent family history.    Social History    reports that she has never smoked. She has never used smokeless tobacco. Her alcohol, drug, and sexual activity histories are not on file.    Living Situation     Questions Responses    Patient lives with     Homeless     Caregiver for other family member     External Services     Employment Employed    Domestic Violence Risk           Problem List   There is no problem list on file for this patient.      Review of Systems   Review of Systems   Constitutional: Negative for fever.   HENT: Negative for congestion.    Eyes: Negative for visual disturbance.   Respiratory: Negative for cough.    Cardiovascular: Negative for chest pain.   Gastrointestinal: Positive for abdominal pain, nausea and vomiting. Negative for diarrhea.   Genitourinary: Negative for difficulty urinating, dysuria and vaginal discharge.   Musculoskeletal: Negative for back pain.   Skin: Negative for rash.   Neurological: Negative for headaches.    Psychiatric/Behavioral: Negative for confusion.       Physical Exam     ED Triage Vitals   BP Heart Rate Heart Rate (via Pulse Ox) Resp Temp Temp src SpO2 (Retired) O2 Device O2 Flow Rate   10/29/16 0522 10/29/16 0522 -- 10/29/16 0522 10/29/16 0522 10/29/16 0522 10/29/16 0522 -- --   121/62 86  15 37 C (98.6 F) TEMPORAL 98 %        Weight           10/29/16 0522           77.1 kg (170 lb)                    Physical Exam   Constitutional: She is oriented to person, place, and time. She appears well-developed and well-nourished. No distress.   HENT:   Head: Normocephalic and atraumatic.   Right Ear: External ear normal.   Left Ear: External ear normal.   Eyes: Conjunctivae and EOM are normal. Pupils are equal, round, and reactive to light.   Neck: Normal range of motion. Neck supple.   Cardiovascular: Normal rate, regular rhythm, normal heart sounds and intact distal pulses.    No murmur  heard.  Pulmonary/Chest: Effort normal and breath sounds normal. No respiratory distress.   Abdominal: Soft. She exhibits no distension. There is tenderness. There is guarding.   Focal guarding in right lower quadrant.  Some discomfort in left lower quadrant with no guarding   Musculoskeletal: Normal range of motion. She exhibits no tenderness.   Neurological: She is alert and oriented to person, place, and time.   Skin: Skin is warm. She is not diaphoretic. No erythema.   Nursing note and vitals reviewed.      Medical Decision Making        Initial Evaluation:  ED First Provider Contact     Date/Time Event User Comments    10/29/16 [phone] ED First Provider Contact Margretta Sidle, Aleksandra Raben Initial Face to Face Provider Contact          Patient seen by me on arrival date of 10/29/2016 at 0723    Assessment:  25 y.o.female comes to the ED with right lower quadrant pain    Differential Diagnosis includes ovarian cyst, appendicitis, enteritis, pid, uti, less likely toa, ovarian torsion, renal stone.                      Plan:     Orders Placed  This Encounter   Procedures    US transvaginal    US abdomen limited single quad or f/u specify    CBC and differential    Basic metabolic panel    RUQ panel (ED only)    POCT urine pregnancy    POCT urinalysis dipstick     Patient sitting comfortably on the side of the bed.  Does have discomfort to examination focally with guarding.  Afebrile here.    Elevated white blood count.  Improved pain after treatment but on exam continues to have focal RLQ pain with guarding.  CT ordered.  Gyn exam reassuring.    +appendicitis on CT.  Surgery contacted and patient admitted for further care.      Desiree Hane, MD           Desiree Hane, MD  11/09/16 [phone]

## 2016-10-29 NOTE — ED Notes (Signed)
Report given to [id number]

## 2016-10-29 NOTE — Plan of Care (Signed)
Pt arrived to unit at 1630 via stretcher, ambulated to bed independently.  VSS, c/o 5/10 pain in abdomen, declines pain medication. [facility], benign.  Oriented to unit and call bell, will continue to monitor.     Pain/Comfort     Patient's pain or discomfort is manageable Progressing towards goal          Cognitive function     Cognitive function will be maintained or return to baseline Maintaining        Mobility     Patient's functional status is maintained or improved Maintaining        Psychosocial     Demonstrates ability to cope with illness Maintaining        Safety     Patient will remain free of falls Maintaining

## 2016-10-29 NOTE — OR Nursing (Signed)
PT greeted and interviewed in Pre-An. Accompanied by parents. PT confirmed name and date of birth. Verified with ID band and chart. PT confirms procedure as consented. Denies latex allergies. Medication allergies reviewed. Denies pre op implants or range of motion and skin integrity issues.  OR routine discussed with patient and family. Questions answered at time of interview.

## 2016-10-29 NOTE — Op Note (Signed)
Operative Note (Surgical Log ID: [id number])       Date of Surgery: 10/29/2016       Surgeons: Moishe Spice) and Role:     * Geryl Rankins, MD - Primary     * He, Ulyess Mort, MD     * Johny Drilling, MD       Pre-op Diagnosis: Pre-Op Diagnosis Codes:     * Appendicitis, unspecified appendicitis type [K37]       Post-op Diagnosis: Post-Op Diagnosis Codes:     * Appendicitis, unspecified appendicitis type [K37]       Procedure(s) Performed: Laparoscopic appendectomy - CPT: 44970       Additional CPT Codes:        Anesthesia Type: general       Fluid Totals:  see anesthesia record       Estimated Blood Loss: Blood Loss: 10 mL       Specimens to Pathology:    ID Type Source Tests Collected by Time Destination   A : appendix TISSUE Appendix SURGICAL PATHOLOGY Lamont Snowball, RN 10/29/2016 2231           Temporary Implants:        Packing:                 Patient Condition: good       Indications: Acute appendicitis on CT       Findings (Including unexpected complications): Inflamed appendicitis, non-perforated     Description of Procedure:   April Craig is a 26 y.o. female who presented with a history of right-sided abdominal pain. A CT scan revealed findings consistent with acute appendicitis.      The patient was seen again in the Pre-Anesthesia Care Unit. The risks, benefits, complications, treatment options, and expected outcomes were discussed with the patient and/or family. The possibilities of reaction to medication, pulmonary aspiration, perforation of viscus, bleeding, recurrent infection, finding a normal appendix, the need for additional procedures, failure to diagnose a condition, and creating a complication requiring transfusion or operation were discussed. There was concurrence with the proposed plan and informed consent was obtained. The patient was taken to the Operating Room, identified as [name] and the procedure verified as a Laparoscopic, possible Open Appendectomy. A Time Out was held and the  above information confirmed.    The patient was placed in the supine position with the right arm comfortably tucked, and general anesthesia was induced following placement of Venodyne boots.  A Foley catheter was placed at this time.  The abdomen was prepped and draped in a sterile fashion. A supraumbilical incision was made and dissection was performed using a Kelly clamp down to the abdominal fascia.      The peritoneal cavity was accessed using the Veress needle technique, and verified in position within the peritoneal cavity using a saline-filled syringe. Pneumoperitoneum was then established to steady pressure of 15 mmHg. A 5 mm trocar was placed into the peritoneal cavity using trans-trocar visualization. Additional 5 and 12 mm trocars were then placed in the left-lower and right-upper quadrants of the abdomen under direct laparoscopic vision. A careful evaluation of the entire abdomen was carried out. The patient was placed in Trendelenburg and left lateral decubitus position. The small intestines were retracted in the cephalad and left lateral direction away from the pelvis and right lower quadrant. The appendix was noted to be mildly inflamed and laying in the retrocecal position.    At this point, the appendix was retracted  superiorly and anteriorly via a laparoscopic Babcock clamp placed in the right-upper quadrant port. The retroperitoneal attachments over the body of the appendix was taken down with the ligasure device.  A window was made in the mesoappendix at the base of the appendix. The appendix was divided at its base using an endo-GIA stapler. The mesoappendix was divided with the Ligasure device. There was no evidence of bleeding, leakage, or complication after division of the appendix. The appendix was removed via the left lower quadrant port in an EndoCatch device. The staple line was examined and noted to be intact and there was no active bleeding.    The trocars were then removed under  laparoscopic vision with no active bleeding.  The abdomen was exsufflated and the left lower quadrant port site was then closed using #0-Vicryl sutures at the level of the fascia. Following placement of 0.5% Marcaine local anesthesia, the trocar site skin wounds were closed using #4-0 Monocryl in a subcuticular fashion.  The skin was dressed with DermaBond.    Instrument, sponge, and needle counts were correct at the conclusion of the case.  The patient tolerated the procedure well and there were no complications.  The patient was then brought to the PACU - hemodynamically stable.        Signed:  Geryl RankinsYANJIE Atheena Spano, MD  on 10/29/2016 at 11:08 PM

## 2016-10-29 NOTE — Anesthesia Preprocedure Evaluation (Addendum)
Anesthesia Pre-operative History and Physical for April DageSarah Odonohue    ______________________________________________________________________________________    Summary:  26 y.o. female with Appendicitis, unspecified appendicitis type (K37) presenting for Procedure(s):  APPENDECTOMY LAPARASCOPIC POSSIBLE OPEN by Surgeon(s):  Geryl RankinsQi, Yanjie, MD  Sindy MessingSabbota, Aaron L, MD  He, Ulyess MortYuncen Atticus, MD  Johny Drillingurdach, Maria, MD scheduled for 90 minutes.    By Rhetta MuraKara F Hedman, CRNA at 10:27 PM on 10/29/2016    <URMCANSURGSITE>  Anesthesia Evaluation Information Source: patient, records     ANESTHESIA  Pertinent(-):  history of anesthetic complications, Family Hx of Anesthetic Complications    GENERAL  Pertinent (-):  obesity, history of anesthetic complications, Family Hx of Anesthetic Complications    HEENT  Pertinent (-):   Corrective Eyewear, glaucoma PULMONARY    + Asthma            mild intermittent, exercise induced  Pertinent(-): smoking, COPD    CARDIOVASCULAR  Pertinent(-):  hypertension, hypotension    GI/HEPATIC/RENAL  Last PO Intake: >8hr before procedure and >2hr before procedure (clears)    + Nausea    + Vomiting            acute  Pertinent(-):  GERD, hiatal hernia NEURO/PSYCH  Pertinent(-):  headaches, dizziness/motion sickness    ENDO/OTHER  Pertinent(-):  diabetes mellitus, thyroid disease, menstruating    HEMALOGIC  Pertinent(-):  bruises/bleeds easily       Physical Exam    Airway            Mouth opening: normal            Mallampati: I            TM distance (fb): >3 FB            TM distance (cm): 5            Neck ROM: full  Dental   Normal Exam   Cardiovascular  Normal Exam           Rhythm: regular    Neurologic    Normal Exam    General Survey    Normal Exam   Pulmonary   Normal Exam    No cough, rhonchi    Mental Status   Normal Exam    + Anxious    Not confused    Operative Site    Normal Exam     ________________________________________________________________________  Plan  ASA Score  2  Anesthetic Plan  general    Induction (routine IV); General Anesthesia/Sedation Maintenance Plan (inhaled agents);  Airway Manipulation (direct laryngoscopy); Airway (cuffed ETT); Line ( use current access); Monitoring (standard ASA); Positioning (supine); PONV Plan (dexamethasone and ondansetron); Pain (per surgical team); PostOp (PACU)    Informed Consent     Risks:          Risks discussed were commensurate with the plan listed above with the following specific points: N/V, aspiration and sore throat , damage to:(eyes, nerves, teeth), awareness, unexpected serious injury, allergic Rx    Anesthetic Consent:      Anesthetic plan (and risks as noted above) were discussed with patient    Plan also discussed with team members including:  surgeon and CRNA    Attending Attestation:  As the primary attending anesthesiologist, I attest that the patient or proxy understands and accepts the risks and benefits of the anesthesia plan. I also attest that I have personally performed a pre-anesthetic examination and evaluation, and prescribed the anesthetic plan for this particular location within 48 hours  prior to the anesthetic as documented. Wendi Snipes, MD 11:30 PM

## 2016-10-29 NOTE — Anesthesia Case Conclusion (Signed)
CASE CONCLUSION  Emergence  Actions:  Suctioned, extubated and soft bite block  Criteria Used for Airway Removal:  Adequate Tv & RR, acceptable O2 saturation and following commands  Assessment:  Routine  Transport  Directly to: PACU  Airway:  Nasal cannula  Oxygen Delivery:  2 lpm  Position:  Supine  Patient Condition on Handoff  Level of Consciousness:  Alert/talking/calm  Patient Condition:  Stable  Handoff Report to:  RN

## 2016-10-29 NOTE — ED Notes (Signed)
Pt to US.

## 2016-10-29 NOTE — Addendum Note (Signed)
Addendum  created 10/29/16 2332 by Wendi Snipesabak Muhanad Torosyan, MD    Anesthesia Event edited, Anesthesia Review and Sign - Ready for Procedure, Anesthesia Review and Sign - Signed, Procedure Event Log accessed

## 2016-10-29 NOTE — INTERIM OP NOTE (Signed)
Interim Op Note (Surgical Log ID: [id number])       Date of Surgery: 10/29/2016       Surgeons: Surgeon(s) and Role:     * Geryl RankinsQi, Yanjie, MD - Primary     * Pio Eatherly, Ulyess MortYuncen Atticus, MD     * Johny Drillingurdach, Maria, MD       Pre-op Diagnosis: Pre-Op Diagnosis Codes:     * Appendicitis, unspecified appendicitis type [K37]       Post-op Diagnosis: Post-Op Diagnosis Codes:     * Appendicitis, unspecified appendicitis type [K37]       Procedure(s) Performed: Procedures:    * APPENDECTOMY LAPARASCOPIC POSSIBLE OPEN       Additional CPT Codes:        Anesthesia Type: Anesthesia type not filed in the log.        Fluid Totals: I/O this shift:  01/25 1500 - 01/25 2259  In: 1105 (14.3 mL/kg) [I.V.:1105]  Out: 110 (1.4 mL/kg) [Urine:100; Blood:10]  Net: 995  Weight: 77.1 kg        Estimated Blood Loss: Blood Loss: 10 mL       Specimens to Pathology:    ID Type Source Tests Collected by Time Destination   A : appendix TISSUE Appendix SURGICAL PATHOLOGY Lamont SnowballCarle, Erika A, RN 10/29/2016 2231           Temporary Implants: None       Packing:  None               Patient Condition: good       Findings (Including unexpected complications): Acute appendicitis, lap appendectomy     Signed:  Ulyess MortYuncen Atticus Saul Fabiano, MD  on 10/29/2016 at 10:53 PM

## 2016-10-29 NOTE — ED Triage Notes (Signed)
RLQ abd pain with N/V x 14 hours, pain worsens with certain movement, denies any significant PMHx.       Triage Note   Angela BurkeMelissa Deserae Jennings, RN

## 2016-10-29 NOTE — Anesthesia Procedure Notes (Signed)
---------------------------------------------------------------------------------------------------------------------------------------    AIRWAY   GENERAL INFORMATION AND STAFF    Patient location during procedure: OR       Date of Procedure: 10/29/2016 9:39 PM  CONDITION PRIOR TO MANIPULATION     Current Airway/Neck Condition:  Normal        For more airway physical exam details, see Anesthesia PreOp Evaluation  AIRWAY METHOD     Patient Position:  Sniffing    Preoxygenated: yes      Induction: IV  Mask Difficulty Assessment:  0 - not attempted      Technique Used for Successful ETT Placement:  Direct laryngoscopy    Blade Type:  Miller    Cormack-Lehane Classification:  Grade I - full view of glottis    Placement Verified by: capnometry, auscultation and equal breath sounds      Number of Attempts at Approach:  1  FINAL AIRWAY DETAILS    Final Airway Type:  Endotracheal airway    Final Endotracheal Airway:  ETT      Cuffed: cuffed    Insertion Site:  Oral    ETT Size (mm):  7.0    Distance inserted from Lips (cm):  22  ADDITIONAL COMMENTS   Atraumatic, dentition remains as it was. Smooth induction.     ----------------------------------------------------------------------------------------------------------------------------------------

## 2016-10-29 NOTE — Consults (Addendum)
Trauma Surgery/Acute Care Surgery  Consult H&P Note    Patient: April Craig  LOS: 0 days  Attending: Maura Crandall        CC: appendicitis    HPI: April Craig is a 26 y.o. female with h/o ovarian cysts who presents to the ED with RLQ pain since noon yesterday starting suddenly, 7/10, crampy with no radiation, worse with movement. She endorses nausea and vomiting x 5. No fevers. Pain is different then prior pain due to ovarian cysts. No GU symptoms. Found to have WBCs of 15.5. [place] did not visualize appendix and showed normal flow to ovaries. CT abdomen read as thickened midportion of appendix with periappendiceal edema consistent with appendicitis. Given ketorolac 30 mg for pain, zofran 4 mg for nausea, cefoxitin 2 g, NSB, and ceftriaxone and azithromycin empirically for STI, testing pending.    Past Medical History:   History reviewed. No pertinent past medical history.    Past Surgical History:   History reviewed. No pertinent surgical history.    Medications:  Current Facility-Administered Medications   Medication    piperacillin-tazobactam (ZOSYN) IVPB 4.5 g     Current Outpatient Prescriptions   Medication Sig    citalopram (CELEXA) 40 MG tablet Take 40 mg by mouth daily    norethindrone-ethinyl estradiol (JUNEL FE 1/20) 1-20 MG-MCG per tablet Take 1 tablet by mouth daily    clonazePAM (KLONOPIN) 0.5 MG disintegrating tablet Take 0.5 mg by mouth 2 times daily as needed for Anxiety    ondansetron (ZOFRAN) 4 MG tablet Take 1 tablet (4 mg total) by mouth 3 times daily as needed for Nausea       Allergies:   Allergies   Allergen Reactions    Amoxicillin Hives    Sulfa Antibiotics Itching       Family History:   family history is not on file.    Social History:   Social History   Substance Use Topics    Smoking status: Never Smoker    Smokeless tobacco: Never Used    Alcohol use Not on file        Review of Systems: As above, otherwise 12 point review of systems negative    Physical  Examination:  Vitals:  Temp:  [36.7 C (98.1 F)-37 C (98.6 F)] 36.7 C (98.1 F)  Heart Rate:  [65-86] 65  Resp:  [15-16] 16  BP: (119-121)/(62) 119/62  Height: 172.7 cm ('5\' 8"' ) Weight: 77.1 kg (170 lb)  General appearance: alert and no distress, uncomfortable with movement   Head: normocephalic, atraumatic, face symmetric  Lungs: nonlabored respirations, clear to auscultation bilaterally  Heart: RRR, no murmur  Abdomen: soft, nondistended, RLQ tenderness, no rebound tenderness, positive obturator sign, negative Rovsing and psoas sign.   Extremities: atraumatic, wwp, no c/c/e  Neuro: alert, oriented, no focal deficits  Vascular: palpable pulses throughout    Lab Results:  Laboratory values:   Recent Labs      10/29/16   0753   WBC  15.5*   Hemoglobin  14.0   Hematocrit  41   Platelets  204     No components found with this basename: APTT, PT Recent Labs      10/29/16   0753   Sodium  138   Potassium  CANCELED   Chloride  100   CO2  22   UN  9   Creatinine  0.58   Glucose  83   Calcium  9.6    Recent Labs  10/29/16   0753   AST  CANCELED   ALT  27   Alk Phos  44   Bilirubin,Total  0.9   Bilirubin,Direct  <0.2   Total Protein  7.6   Amylase  49   Lipase  20   Albumin  4.8      Imaging:   Ct Abdomen And Pelvis With Contrast    Result Date: 10/29/2016  IMPRESSION:  Thickening of the midportion of the appendix with periappendiceal infiltrative changes/edema, consistent with acute appendicitis.  END OF IMPRESSION    [facility] submits this DICOM format image data and final report to the [facility], an independent secure electronic health information exchange, on a reciprocally searchable basis (with patient authorization) for a minimum of 12 months after exam date.    US Abdomen Limited Single Quad Or F/u Specify    Result Date: 10/29/2016  IMPRESSION:  Appendix is not visualized in the right lower quadrant. No fluid in the right iliac fossa.  Appendicitis cannot be excluded based on this examination.  END OF  IMPRESSION.  [facility] submits this DICOM format image data and final report to the [facility], an independent secure electronic health information exchange, on a reciprocally searchable basis (with patient authorization) for a minimum of 12 months after exam date.      US Transvaginal    Result Date: 10/29/2016  IMPRESSION:  1. Arterial flow is visualized to both ovaries. No focal ovarian lesions are appreciated.  2. Trace pelvic free fluid.  END OF IMPRESSION.   I have personally reviewed the image(s) and the resident's interpretation and agree with or edited the findings.  [facility] submits this DICOM format image data and final report to the [facility], an independent secure electronic health information exchange, on a reciprocally searchable basis (with patient authorization) for a minimum of 12 months after exam date.      US Pelvic Complete    Result Date: 10/29/2016  IMPRESSION:  1. Arterial flow is visualized to both ovaries. No focal ovarian lesions are appreciated.  2. Trace pelvic free fluid.  END OF IMPRESSION.   I have personally reviewed the image(s) and the resident's interpretation and agree with or edited the findings.  [facility] submits this DICOM format image data and final report to the [facility], an independent secure electronic health information exchange, on a reciprocally searchable basis (with patient authorization) for a minimum of 12 months after exam date.         ASSESSMENT  April Craig is a 26 y.o. female with h/o ovarian cysts, who presents to the ED with RLQ pain x 1 day, nausea, vomiting, leukocytosis as well as physical exam and CT findings consistent with acute appendicitis who appears uncomfortable but not-septic.    PLAN   NPO   Pain control   Antiemetics   On antibiotics - cefoxitin    Plan for laproscopic appendectomy and admit to general surgery.   Please call [facility] resident on call with any questions or concerns.    Author: Rolla Etienne, MD as  of: 10/29/2016  at: 1:53 PM        R4 Addendum:    History as above.  Pt is a 26 y/o female with h/o ovarian cysts who presents with RLQ abdominal pain that began yesterday.  She also complains of nausea and vomiting.  Denies fever and chills.  Labs were significant for leukocytosis (WBC = 15.5) with shift.  CT abdomen/pelvis demonstrated  a dilated appendix with surrounding infiltrative changes.  On physical exam, she is afebrile and hemodynamically normal with tenderness to palpation localized to the RLQ but no signs of peritonitis.  Clinical exam and imaging are consistent with acute appendicitis.    - Admit to Acute Care Surgery (Dr. Maura Crandall)  - To OR for appendectomy  - Surgical consent in chart  - NPO, IVF  - IV antibiotics  - Pain control  - Heparin SC on-call to Brent T. Willeen Niece, MD     10/29/2016     6:11 PM  General Surgery Resident, PGY-4

## 2016-10-29 NOTE — Anesthesia Postprocedure Evaluation (Signed)
Anesthesia Post-Op Note    Patient: April Craig    Procedure(s) Performed:  Procedure Summary     Date Anesthesia Start Anesthesia Stop Room / Location    10/29/16 2127 [id number]       Procedure Diagnosis Surgeon Attending Anesthesia    APPENDECTOMY LAPARASCOPIC POSSIBLE OPEN (N/A Abdomen) Appendicitis, unspecified appendicitis type  (Appendicitis, unspecified appendicitis type [K37]) Geryl RankinsQi, Yanjie, MD Kemper Durieabak Shatavia Santor, Adrian BlackwaterZana, MD        Recovery Vitals  BP: 109/55 (10/29/2016 11:15 PM)  Heart Rate: 88 (10/29/2016 11:15 PM)  Heart Rate (via Pulse Ox): 86 (10/29/2016 11:15 PM)  Resp: 24 (10/29/2016 11:15 PM)  Temp: 36.4 C (97.5 F) (10/29/2016 10:56 PM)  SpO2: 97 % (10/29/2016 11:15 PM)   0-10 Scale: 0 (10/29/2016 11:00 PM)  Anesthesia type:  Anesthesia type not filed in the log.  Complications Noted During Procedure or in PACU:  None   Comment:    Patient Location:  PACU  Level of Consciousness:    Recovered to baseline  Patient Participation:     Able to participate  Temperature Status:    Normothermic  Oxygen Saturation:    Within patient's normal range  Cardiac Status:   Within patient's normal range  Fluid Status:    Stable  Airway Patency:     Yes  Pulmonary Status:    Baseline  Pain Management:    Adequate analgesia  Nausea and Vomiting:  None    Post Op Assessment:    Tolerated procedure well   Attending Attestation:  All indicated post anesthesia care provided     -

## 2016-10-30 DIAGNOSIS — K37 Unspecified appendicitis: Secondary | ICD-10-CM | POA: Diagnosis present

## 2016-10-30 LAB — CHLAMYDIA PLASMID DNA AMPLIFICATION: Chlamydia Plasmid DNA Amplification: 0

## 2016-10-30 LAB — VAGINITIS SCREEN: DNA PROBE: Vaginitis Screen:DNA Probe: 0

## 2016-10-30 LAB — N. GONORRHOEAE DNA AMPLIFICATION: N. gonorrhoeae DNA Amplification: 0

## 2016-10-30 MED ORDER — OXYCODONE HCL 5 MG PO TABS *I*
5.0000 mg | ORAL_TABLET | ORAL | Status: DC | PRN
Start: 2016-10-30 — End: 2016-10-30
  Administered 2016-10-30 (×2): 5 mg via ORAL
  Filled 2016-10-30 (×2): qty 1

## 2016-10-30 MED ORDER — OXYCODONE HCL 5 MG PO TABS *I*
ORAL_TABLET | ORAL | Status: AC
Start: 2016-10-30 — End: 2016-10-30
  Administered 2016-10-30: 5 mg via ORAL
  Filled 2016-10-30: qty 1

## 2016-10-30 MED ORDER — IBUPROFEN 400 MG PO TABS *I*
400.0000 mg | ORAL_TABLET | Freq: Four times a day (QID) | ORAL | Status: DC | PRN
Start: 2016-10-30 — End: 2016-10-30

## 2016-10-30 MED ORDER — OXYCODONE HCL 5 MG PO TABS *I*
5.0000 mg | ORAL_TABLET | ORAL | 0 refills | Status: AC | PRN
Start: 2016-10-30 — End: ?

## 2016-10-30 MED ORDER — ACETAMINOPHEN 500 MG PO TABS *I*
ORAL_TABLET | ORAL | Status: AC
Start: 2016-10-30 — End: 2016-10-30
  Administered 2016-10-30: 1000 mg via ORAL
  Filled 2016-10-30: qty 2

## 2016-10-30 MED ORDER — HYDROMORPHONE HCL 2 MG/ML IJ SOLN *WRAPPED*
0.5000 mg | INTRAMUSCULAR | Status: DC | PRN
Start: 2016-10-30 — End: 2016-10-30

## 2016-10-30 MED ORDER — ACETAMINOPHEN 500 MG PO TABS *I*
1000.0000 mg | ORAL_TABLET | Freq: Three times a day (TID) | ORAL | Status: DC
Start: 2016-10-30 — End: 2016-10-30
  Administered 2016-10-30: 1000 mg via ORAL
  Filled 2016-10-30: qty 2

## 2016-10-30 NOTE — Progress Notes (Signed)
Trauma & Acute Care Surgery  Progress Note     April Craig is a 26 y.o. female who is  LOS: 1 day  for acute appendicitis s/p lap appy on 1/25.     Events / Subjective     NAE. Pain controlled. Tolerating clears and crackers. OOB and ambulating. + flatus, - BM, voiding spontaneously. Denies f/c, cp, sob, n/v.     Objective     Vital Signs:  Temp:  [36 C (96.8 F)-37.5 C (99.5 F)] 37.5 C (99.5 F)  Heart Rate:  [65-101] 81  Resp:  [14-24] 18  BP: (102-130)/(48-69) 121/58    I/Os:  I/O last 3 completed shifts:  01/24 2300 - 01/25 2259  In: 2005 (26 mL/kg) [I.V.:1105 (0.6 mL/kg/hr); Other:900]  Out: 110 (1.4 mL/kg) [Urine:100 (0.1 mL/kg/hr); Blood:10]  Net: 1895  Weight: 77.1 kg     GEN: Resting comfortably in bed, NAD  HEENT: NC/AT  CV: RRR  PULM: Unlabored respirations, on RA  ABD: soft, ND, appropriately ttp, incisions c/d/i with Dermabond  EXT: WWP  NEURO: alert, motor/sensation grossly intact, no focal deficits       Labs  BMP/Electrolytes CBC   Recent Labs      10/29/16   0753   Sodium  138   Potassium  CANCELED   Chloride  100   CO2  22   UN  9   Creatinine  0.58   Glucose  83   Calcium  9.6   Albumin  4.8    Recent Labs      10/29/16   0753   WBC  15.5*   Hemoglobin  14.0   Hematocrit  41   Platelets  204   MCV  92   RDW  12.8   Seg Neut %  85.1   Lymphocyte %  7.4   Monocyte %  6.3   Eosinophil %  0.6   Basophil %  0.3      Liver Function Cardiac Studies   Recent Labs      10/29/16   0753   AST  CANCELED   ALT  27   Bilirubin,Total  0.9   Bilirubin,Direct  <0.2     No components found with this basename: ALKPHOS No results for input(s): CKTS, TROPU, TROP, MCKMB, CKMB in the last 168 hours.    No components found with this basename: RICKMBS    Coagulation Glucose   No results for input(s): PTI, INR in the last 8760 hours.    No results for input(s): PTT in the last 8760 hours.       Lab results: 10/29/16  2040 10/29/16  0753 07/07/16  1035   Glucose  --  83 121*   Glucose POCT 86  --   --        No  results found for: HA1C     Imaging  Ct Abdomen And Pelvis With Contrast    Result Date: 10/29/2016  IMPRESSION:  Thickening of the midportion of the appendix with periappendiceal infiltrative changes/edema, consistent with acute appendicitis.  END OF IMPRESSION    [facility] submits this DICOM format image data and final report to the [facility], an independent secure electronic health information exchange, on a reciprocally searchable basis (with patient authorization) for a minimum of 12 months after exam date.    [place] Abdomen Limited Single Quad Or F/u Specify    Result Date: 10/29/2016  IMPRESSION:  Appendix is not visualized in the right lower  quadrant. No fluid in the right iliac fossa.  Appendicitis cannot be excluded based on this examination.  END OF IMPRESSION.  [facility] submits this DICOM format image data and final report to the [facility], an independent secure electronic health information exchange, on a reciprocally searchable basis (with patient authorization) for a minimum of 12 months after exam date.      US Transvaginal    Result Date: 10/29/2016  IMPRESSION:  1. Arterial flow is visualized to both ovaries. No focal ovarian lesions are appreciated.  2. Trace pelvic free fluid.  END OF IMPRESSION.   I have personally reviewed the image(s) and the resident's interpretation and agree with or edited the findings.  [facility] submits this DICOM format image data and final report to the [facility], an independent secure electronic health information exchange, on a reciprocally searchable basis (with patient authorization) for a minimum of 12 months after exam date.      US Pelvic Complete    Result Date: 10/29/2016  IMPRESSION:  1. Arterial flow is visualized to both ovaries. No focal ovarian lesions are appreciated.  2. Trace pelvic free fluid.  END OF IMPRESSION.   I have personally reviewed the image(s) and the resident's interpretation and agree with or edited the findings.  [facility]  submits this DICOM format image data and final report to the [facility], an independent secure electronic health information exchange, on a reciprocally searchable basis (with patient authorization) for a minimum of 12 months after exam date.           Assessment / Plan    April Craig is a 26 y.o. female with h/o ovarian cysts presenting with acute appendicitis now s/p laparoscopic appendectomy on 1/25.      No antibiotics post op    FEN: IVF for 6 hrs post op, replete lytes PRN, Diet clear liquids- advance diet as tolerated   Analgesia prn, antiemetics prn   Activity: as tolerated   DVT ppx: IPCs   Dispo: discharge today         Author: Johny Drilling, MD  on: 10/30/2016  at: 3:31 AM

## 2016-10-30 NOTE — Plan of Care (Signed)
Pain/Comfort     Patient's pain or discomfort is manageable Progressing towards goal        Post-Operative Bowel Elimination     Elimination pattern is normal or improving Progressing towards goal          Bowel Elimination     Elimination patterns are normal or improving Maintaining        Cognitive function     Cognitive function will be maintained or return to baseline Maintaining        Fluid and Electrolyte Imbalance     Fluid and Electrolyte imbalance Maintaining        Mobility     Patient's functional status is maintained or improved Maintaining        Post-Operative Bladder Elimination     Patient is able to empty bladder or return to baseline Maintaining        Post-Operative Complications     Prevent post-operative complications Maintaining     Patient will remain free from symptoms of infection-post op Maintaining        Post-Operative Hemodynamic Stability     Maintain Hemodynamic Stability Maintaining        Psychosocial     Demonstrates ability to cope with illness Maintaining        Safety     Patient will remain free of falls Maintaining

## 2016-10-30 NOTE — Discharge Instructions (Signed)
[facility] Surgery Discharge Instructions    Date: 10/30/2016  Procedure: Laparoscopic Appendectomy  Surgeon: Geryl RankinsQi, Yanjie, MD    You have received sedative medication and/or general anesthesia which may make you drowsy for as long as 24 hours:  A) DO NOT drive or operate any machinery for 24 hours  B) DO NOT drink alcoholic beverages for 24 hours  C) DO NOT make major decisions, sign contracts, etc. for 24 hours    DIET - You may have gas pains and/or discomfort while your digestion returns to normal.  Eat light meals until bowel habits return to your baseline function.  Then return to eating normally.     PAIN MANAGEMENT - Per medication list below. Norco/Percocet contains tylenol (acetaminophen). Please do not take tylenol or tylenol containing products while on these medications to avoid tylenol overdose. Many over the counter medications contain acetaminophen. Do not exceed 4000 mg of tylenol (acetaminophen) per day.     CARE OF THE INCISION - After surgery, you will usually have 3 small abdominal incisions, each covered with a dressing or bandaid.  The dressing can be removed after 48 hours. Under the dressing you will find several thin paper tapes (steri-strips) or topical tissue adhesive covering the incision.  The steri-strips or skin adhesive should be left in place.  The stitches are beneath the skin and will dissolve. You may shower after the dressing is removed.  Do not scrub the incisions.  Just let the water run over them and then gently pat the areas dry.  Do not submerge the incisions for 1 week (no baths, hot tubs, or swimming pools.)    COMFORT MEASURES - You may feel tired and have some abdominal discomfort and bloating for a few days to a few weeks. Moving and/or walking will help relieve this.  You may have bruising, swelling and soreness.  Use an ice pack or bag of frozen peas on the incision 20 minutes every hour for the first 3 days. Take ibuprofen (Motrin, Advil) 2 tablets 3  times a day to relieve any pain that you have.  A narcotic pain medication prescribed by your doctor may be used in addition to ibuprofen as needed.    FOLLOW-UP  OFFICE VISIT - You need to be seen in the office approximately two to three weeks after your surgery.  You will be given this appointment when the date of your surgery has been set and it will be documented in the surgical paperwork mailed to you.    ACTIVITY/RETURN TO WORK - Limit your activities after surgery to what is comfortable for you.  Dont do any heavy lifting or exercise for 6 weeks.  Refrain from any activity that causes pain. Wear loose, comfortable clothing.  You may return to work when you feel ready (usually 1 -2 weeks) as long as your job does not involve heavy lifting or strenuous activity.  You may return to work before being seen in the office postoperatively.     DRIVING - You may drive when you are not using any prescription pain medication and when you are able to react normally.    WHEN TO CALL THE OFFICE - Do not hesitate to call the office if you develop a fever (temperature greater that 101), shaking chills, nausea or vomiting, diarrhea, dizziness, bleeding or drainage from your incision, redness around your incision, persistent or increased pain, or with any other problem that concerns you.    CONSTIPATION - All pain medication  has the potential to cause constipation.  Increasing your fiber (fruits, vegetables, bran, etc) and fluid intake will help to avoid this problem.  If more than 24 hours have passed without having a bowel movement, you may use Milk of Magnesia as directed.  If problem persist, please contact the office.    Call Dr. Geryl Rankins, MD  at [phone] with questions.     Author: Satira Sark, MD as of: 10/30/2016  at: 11:39 AM

## 2016-10-30 NOTE — Plan of Care (Signed)
Bowel Elimination     Elimination patterns are normal or improving Maintaining        Cognitive function     Cognitive function will be maintained or return to baseline Maintaining        Fluid and Electrolyte Imbalance     Fluid and Electrolyte imbalance Maintaining        Mobility     Patient's functional status is maintained or improved Maintaining        Pain/Comfort     Patient's pain or discomfort is manageable Maintaining        Post-Operative Bladder Elimination     Patient is able to empty bladder or return to baseline Maintaining        Post-Operative Bowel Elimination     Elimination pattern is normal or improving Maintaining        Post-Operative Complications     Prevent post-operative complications Maintaining     Patient will remain free from symptoms of infection-post op Maintaining        Post-Operative Hemodynamic Stability     Maintain Hemodynamic Stability Maintaining        Psychosocial     Demonstrates ability to cope with illness Maintaining        Safety     Patient will remain free of falls Maintaining

## 2016-10-30 NOTE — Discharge Summary (Signed)
Name: April Craig MRN: [id number] DOB: [birth year]     Admit Date: 10/29/2016   Date of Discharge: 10/30/2016    Patient was accepted for discharge to   Home or Self Care [1]           Discharge Attending Physician: Geryl RankinsQI, YANJIE      Hospitalization Summary    CONCISE NARRATIVE:   April Craig is a 26 y.o. female who underwent laparoscopic appendectomy on 10/29/2016 for acute appendicitis. Patient tolerated the surgery well. Patient's pain was controlled with IV and then oral medications. Diet was advanced and patient tolerated a regular diet without nausea or vomiting. Upon discharge, patient was tolerating diet, voiding spontaneously, having flatus, and ambulating. At time of discharge, the abdomen was soft and non distended and the incisions were clean, dry, and intact. Patient will resume home medications. Vital signs are stable. Safe for discharge and given instructions for follow-up.             OR PROCEDURE: 10/29/16: laparoscopic appendectomy  OTHER IMAGING RESULTS:   Ct Abdomen And Pelvis With Contrast    Result Date: 10/29/2016  IMPRESSION:  Thickening of the midportion of the appendix with periappendiceal infiltrative changes/edema, consistent with acute appendicitis.  END OF IMPRESSION    [facility] submits this DICOM format image data and final report to the [facility], an independent secure electronic health information exchange, on a reciprocally searchable basis (with patient authorization) for a minimum of 12 months after exam date.    [place] Abdomen Limited Single Quad Or F/u Specify    Result Date: 10/29/2016  IMPRESSION:  Appendix is not visualized in the right lower quadrant. No fluid in the right iliac fossa.  Appendicitis cannot be excluded based on this examination.  END OF IMPRESSION.  [facility] submits this DICOM format image data and final report to the [facility], an independent secure electronic health information exchange, on a reciprocally searchable basis (with patient authorization) for  a minimum of 12 months after exam date.      [place] Transvaginal    Result Date: 10/29/2016  IMPRESSION:  1. Arterial flow is visualized to both ovaries. No focal ovarian lesions are appreciated.  2. Trace pelvic free fluid.  END OF IMPRESSION.   I have personally reviewed the image(s) and the resident's interpretation and agree with or edited the findings.  [facility] submits this DICOM format image data and final report to the [facility], an independent secure electronic health information exchange, on a reciprocally searchable basis (with patient authorization) for a minimum of 12 months after exam date.      [place] Pelvic Complete    Result Date: 10/29/2016  IMPRESSION:  1. Arterial flow is visualized to both ovaries. No focal ovarian lesions are appreciated.  2. Trace pelvic free fluid.  END OF IMPRESSION.   I have personally reviewed the image(s) and the resident's interpretation and agree with or edited the findings.  [facility] submits this DICOM format image data and final report to the [facility], an independent secure electronic health information exchange, on a reciprocally searchable basis (with patient authorization) for a minimum of 12 months after exam date.          SIGNIFICANT MED CHANGES: Yes  Oxycodone 5mg  Q4prn pain      Signed: Satira SarkMartha Ellen Antonieta Slaven, MD  On: 10/30/2016  at: 11:44 AM

## 2016-10-30 NOTE — Progress Notes (Signed)
Utilization Management    Level of Care Inpatient as of the date 10/29/2016    Kery Batzel D Sagajllo, RN     Pager: [phone]

## 2016-10-30 NOTE — Progress Notes (Signed)
Pt meets discharge criteria from PACU.  Pt reports pain at a tolerable level and no complaints of nausea.  Patient signed out by  Z. Borovcanin, MD, see sign out note.  VS WDL, surgical sites C/D/I, see doc flow sheets for vs and assessments.  Report called to Bethann BerkshireShannon S, RN and pt transferred via stretcher to 536 on room air.

## 2016-11-03 ENCOUNTER — Encounter: Payer: Self-pay | Admitting: Surgery

## 2016-11-03 ENCOUNTER — Telehealth: Payer: Self-pay | Admitting: Trauma Surgery

## 2016-11-03 ENCOUNTER — Other Ambulatory Visit: Payer: Self-pay | Admitting: Student in an Organized Health Care Education/Training Program

## 2016-11-03 LAB — SURGICAL PATHOLOGY

## 2016-11-03 NOTE — Telephone Encounter (Signed)
Patient requested pain medication refill of her oxycodone 5 mg. She is s/p laparoscopic appendectomy on 10/29/16 with Dr. Algis DownsQi. Patient reports pain is located around her incisions and her "gut." When asked where the pain was located in her gut, she states it was mostly around her LLQ incision. She rates the pain as 8/10. Patient reports having tried tylenol and ibuprofen for pain relief without success. She has not taken the medication regularly and only when she experiences the pain. Denies fever, chills, nausea, vomiting, anorexia, constipation. Patient reports 1 loose BM a day. Recommend taking tylenol and ibuprofen on a scheduled basis and to use ice vs heat pack for any incisional pain as needed. Tylenol 1000 mg Q 8 hours alternated with Ibuprofen 400 mg Q 6 hours should be attempted prior to refilling oxycodone 5 days out from a laparoscopic procedure. Patient understands and will call if pain persists or intensifies. Pathology indicative of acute appendicitis and this was reviewed with the patient.    Velna HatchetJacob Adelena Desantiago, [place] 11/03/2016 3:29 PM

## 2016-11-03 NOTE — Telephone Encounter (Signed)
Patient Name: Haynes DageSarah Dunkel Birth Date: [birth year]   Address[place] [address] [place], [place] [id number] Sex: Female   Rx Written Rx Dispensed Drug Quantity Days Supply Prescriber Name Payment Method Dispenser   10/30/2016 10/30/2016 oxycodone hcl 5 mg tablet  20 4 [facility] Insurance [facility] [phone]#[id number]   09/07/2016 09/11/2016 clonazepam 0.5 mg tablet  30 7 Adrian ProwsFoote, Michael Martin MD Insurance [facility] [phone]#[id number]   07/30/2016 07/31/2016 clonazepam 0.5 mg tablet  30 8 Ronalee RedYax, Karen Ogren MS FNP Insurance [facility] [phone]#[id number]   06/18/2016 06/18/2016 clonazepam 0.5 mg tablet  30 8 Lyda Peroneockwell, Susan V MD [facility] [phone]#[phone]

## 2016-11-06 ENCOUNTER — Other Ambulatory Visit: Payer: Self-pay | Admitting: Family Medicine

## 2016-11-07 LAB — DRUG SCREEN CHEMICAL DEPENDENCY, URINE
Amphetamine,UR: NEGATIVE ^^L
Benzodiazepinen,UR: NEGATIVE ^^L
Cocaine/Metab,UR: NEGATIVE ^^L
Opiates,UR: NEGATIVE ^^L
THC Metabolite,UR: POSITIVE ^^L

## 2016-12-02 ENCOUNTER — Ambulatory Visit: Payer: Self-pay | Admitting: Surgery

## 2016-12-12 ENCOUNTER — Ambulatory Visit
Admission: AD | Admit: 2016-12-12 | Discharge: 2016-12-12 | Disposition: A | Discharge status: Home / Self Care | Payer: No Typology Code available for payment source | Source: Ambulatory Visit | Attending: Obstetrics and Gynecology | Admitting: Obstetrics and Gynecology

## 2016-12-12 DIAGNOSIS — B9789 Other viral agents as the cause of diseases classified elsewhere: Secondary | ICD-10-CM

## 2016-12-12 DIAGNOSIS — J069 Acute upper respiratory infection, unspecified: Secondary | ICD-10-CM

## 2016-12-12 HISTORY — DX: Unspecified asthma, uncomplicated: J45.909

## 2016-12-12 MED ORDER — ALBUTEROL SULFATE HFA 108 (90 BASE) MCG/ACT IN AERS *I*
1.0000 | INHALATION_SPRAY | Freq: Four times a day (QID) | RESPIRATORY_TRACT | 0 refills | Status: AC | PRN
Start: 2016-12-12 — End: 2017-01-11

## 2016-12-12 MED ORDER — ALBUTEROL SULFATE (2.5 MG/3ML) 0.083% IN NEBU *I*
2.5000 mg | INHALATION_SOLUTION | Freq: Once | RESPIRATORY_TRACT | Status: AC
Start: 2016-12-12 — End: 2016-12-12
  Administered 2016-12-12: 2.5 mg via RESPIRATORY_TRACT

## 2016-12-12 MED ORDER — IPRATROPIUM BROMIDE 0.02 % IN SOLN *I*
500.0000 ug | Freq: Once | RESPIRATORY_TRACT | Status: AC
Start: 2016-12-12 — End: 2016-12-12
  Administered 2016-12-12: 500 ug via RESPIRATORY_TRACT

## 2016-12-12 NOTE — Discharge Instructions (Signed)
Today your exam was reassuring with normal vital sings and improved lung exam following nebulizer treatment. No evidence of focal bacterial process and symptoms most likely viral. Treat symptoms with albuterol inhaler (every 4 hours while awake), Vicks' vapor rub, hot tea and honey, cool mist humidifier, and Guaifenesin. If you are having difficulty sleeping try taking Benadryl or Nyquil. If you develop any shortness of breath, increased work of breathing, fevers, or no improvement after 2-3 weeks please follow up with primary care doctor for re-evaluation.     I have given you a letter for Monday off, please rest and feel better!

## 2016-12-12 NOTE — UC Provider Note (Signed)
History     Chief Complaint   Patient presents with    Chest congestion     1 week with cold symptoms and coughing up green mucus. Tried OTC cough and cold medicines    Cough     HPI Comments: 26 year old female here for evaluation of cough and chest congestion. Symptoms started one week ago. Seems to be worse at night, making it difficult to sleep. 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 Boyfriend with similar symptoms. Has been attempting to treat with Alka selzer with only slight improvement of symptoms. Bringing up green mucous. Initially started with sore throat which has since improved.       History provided by:  Patient  Language interpreter used: No        Past Medical History:   Diagnosis Date    Asthma     childhood             Past Surgical History:   Procedure Laterality Date    APPENDECTOMY         No family history on file.      Social History    reports that she has never smoked. She has never used smokeless tobacco. She reports that she drinks alcohol. She reports that she does not use illicit drugs. Her sexual activity history is not on file.    Living Situation     Questions Responses    Patient lives with     Homeless     Caregiver for other family member     External Services     Employment Employed    Domestic Violence Risk           Review of Systems   Review of Systems   Constitutional: Negative for chills, fatigue and fever.   HENT: Positive for sore throat. Negative for congestion, ear pain, sinus pain, sinus pressure, sneezing, trouble swallowing and voice change.    Respiratory: Positive for cough and shortness of breath (with increased activity ). Negative for wheezing.    Cardiovascular: Negative for chest pain and palpitations.   Gastrointestinal: Negative for nausea and vomiting.   Musculoskeletal: Negative for myalgias.   Skin: Negative for pallor.   Neurological: Negative for dizziness, light-headedness and headaches.       Physical  Exam       ED Triage Vitals   BP Heart Rate Heart Rate (via Pulse Ox) Resp Temp Temp src SpO2 (Retired) O2 Device O2 Flow Rate   12/12/16 1921 12/12/16 1921 -- 12/12/16 1921 12/12/16 1921 -- 12/12/16 1921 -- --   127/58 71  16 36.4 C (97.5 F)  97 %        Weight           --                               Physical Exam   Constitutional: She is oriented to person, place, and time. She appears well-developed and well-nourished. No distress.   HENT:   Head: Normocephalic and atraumatic.   Right Ear: External ear and ear canal normal. Tympanic membrane is not injected and not bulging.   Left Ear: External ear and ear canal normal. Tympanic membrane is not injected and not bulging.   Nose: Right sinus exhibits no maxillary sinus tenderness and no frontal sinus tenderness. Left sinus exhibits no maxillary sinus tenderness  and no frontal sinus tenderness.   Mouth/Throat: Uvula is midline. Posterior oropharyngeal erythema (mild ) present. No posterior oropharyngeal edema. Tonsils are 2+ on the right. Tonsils are 2+ on the left. No tonsillar exudate.   Eyes: Conjunctivae are normal. Right eye exhibits no discharge. Left eye exhibits no discharge.   Neck: Normal range of motion.   Cardiovascular: Normal rate and regular rhythm.    No murmur heard.  Pulmonary/Chest: Effort normal. No respiratory distress. She has wheezes. She exhibits no tenderness.   Lymphadenopathy:     She has no cervical adenopathy.   Neurological: She is alert and oriented to person, place, and time.   Skin: Skin is warm. She is not diaphoretic. No pallor.   Nursing note and vitals reviewed.       Medical Decision Making        Initial Evaluation:  ED First Provider Contact     Date/Time Event User Comments    12/12/16 1920 ED First Provider Contact Kerri-Anne Haeberle A Initial Face to Face Provider Contact          Patient was seen on: 12/12/2016        Assessment:  25 y.o.female comes to the Urgent Care Center with Cough and chest congestion      Differential Diagnosis includes Acute viral bronchitis  Community acquired pneumonia  Acute URI NOS  Acute pneumonitis  Pleurisy                  Plan: No evidence of focal bacterial process symptoms most likely viral. Lung sounds clear after Duo Neb.  Supportive care measures reviewed as well as return precautions. Excuse for work provided.     Orders Placed This Encounter    albuterol (PROVENTIL) nebulization 2.5 mg    ipratropium (ATROVENT) 0.02 % nebulizer solution 500 mcg    albuterol HFA 108 (90 BASE) MCG/ACT inhaler       No results found for this or any previous visit (from the past 24 hour(s)).      Final Diagnosis    ICD-10-CM ICD-9-CM   1. Viral URI with cough J06.9 465.9    B97.89      Today your exam was reassuring with normal vital sings and improved lung exam following nebulizer treatment. No evidence of focal bacterial process and symptoms most likely viral. Treat symptoms with albuterol inhaler (every 4 hours while awake), Vicks' vapor rub, hot tea and honey, cool mist humidifier, and Guaifenesin. If you are having difficulty sleeping try taking Benadryl or Nyquil. If you develop any shortness of breath, increased work of breathing, fevers, or no improvement after 2-3 weeks please follow up with primary care doctor for re-evaluation.    Encourage fluids, encourage rest, good hand hygiene.    Use over the counter medications as discussed.    Please start the new medications as below:    Current Discharge Medication List          Please follow up with your physician as below:        Thank you Zennie Ayars for coming to [facility] for your health care concerns.    If your condition changes and/or worsens please follow up with her primary doctor and/or return to the urgent care center.    If short of breath, chest pains or any other concerns please report to the emergency room.    In the event of an Emergency dial 911.      Final Diagnosis  Final  diagnoses:   [J06.9, B97.89] Viral URI with  cough (Primary)           Joice Lofts, NP    Collaborating physician Dorna Bloom, MD was immediately available     Joice Lofts, NP  12/12/16 2016

## 2016-12-15 ENCOUNTER — Ambulatory Visit
Admission: AD | Admit: 2016-12-15 | Discharge: 2016-12-15 | Disposition: A | Discharge status: Home / Self Care | Payer: No Typology Code available for payment source | Source: Ambulatory Visit | Attending: Emergency Medicine | Admitting: Emergency Medicine

## 2016-12-15 DIAGNOSIS — R05 Cough: Secondary | ICD-10-CM

## 2016-12-15 DIAGNOSIS — R112 Nausea with vomiting, unspecified: Secondary | ICD-10-CM | POA: Insufficient documentation

## 2016-12-15 DIAGNOSIS — R0981 Nasal congestion: Secondary | ICD-10-CM | POA: Insufficient documentation

## 2016-12-15 MED ORDER — ONDANSETRON HCL 4 MG PO TABS *I*
4.0000 mg | ORAL_TABLET | Freq: Three times a day (TID) | ORAL | 0 refills | Status: AC | PRN
Start: 2016-12-15 — End: ?

## 2016-12-15 MED ORDER — FLUTICASONE PROPIONATE 50 MCG/ACT NA SUSP *I*
1.0000 | Freq: Two times a day (BID) | NASAL | 0 refills | Status: AC
Start: 2016-12-15 — End: ?

## 2016-12-15 NOTE — ED Triage Notes (Signed)
c/o cough, nausea, vomiting, nasal congestion, loss of appetite, malaise starting 8 days ago.        Triage Note   April DikesScott [name], RN

## 2016-12-15 NOTE — Discharge Instructions (Signed)
For nasal congestion and cough:  May use any over the counter nasal steroid spray (Flonase, Nasonex, etc). Gargle and spit after use.  Hot then cold compress over cheeks and forehead for 5 minutes each, then gargle with warm salt (1/4 teaspoon per 8 ounces) water.  If secretions are thick, please add Guaifenesin (plain Robitussin, plain Mucinex, others) around the clock until improving, to make mucus thin and move more easily.  Dextromethorphan is a cough suppressant that may cause sleepiness.  To reduce the spread of illness:  Wash hands before and after touching your face, after using the bathroom, or after vomiting.  Do not share beverages, utensils or towels.  Clean bathroom and kitchen surfaces, faucets and doorknobs, with 10% bleach solution (1 part bleach to 9 parts water) or commercial antiseptic solution or wipes such as Lysol.    Thank you Haynes DageSarah Shearn for coming to [facility] for your health care concerns.    If your condition changes and/or worsens please follow up with your primary doctor and/or return to the urgent care center.    If short of breath, chest pains or any other concerns please report to the emergency room.    In the event of an Emergency dial 911.

## 2016-12-15 NOTE — UC Provider Note (Signed)
History     Chief Complaint   Patient presents with    Cough     c/o cough, nausea, vomiting, nasal congestion, loss of appetite, malaise starting 8 days ago.      HPI Comments: Began vomiting this morning.  She's been ill with upper respiratory symptoms for about 8 days and had some nausea, but today was the first day she's vomited.  In general she has a sensitive stomach.  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  No new fever, no rash.  The right upper lateral laparoscopy site (from her appendectomy 10/29/16) was painful while she was vomiting.      History provided by:  Patient      Past Medical History:   Diagnosis Date    Asthma     childhood             Past Surgical History:   Procedure Laterality Date    APPENDECTOMY         History reviewed. No pertinent family history.      Social History    reports that she has never smoked. She has never used smokeless tobacco. She reports that she drinks alcohol. She reports that she does not use illicit drugs. Her sexual activity history is not on file.    Living Situation     Questions Responses    Patient lives with     Homeless     Caregiver for other family member     External Services     Employment Employed    Domestic Violence Risk           Review of Systems   Review of Systems   Constitutional: Positive for activity change and fatigue. Negative for fever.   HENT: Positive for congestion and sinus pressure. Negative for ear pain, sore throat, trouble swallowing and voice change.    Eyes: Negative for visual disturbance.   Respiratory: Positive for cough and wheezing (controlled by albuterol).    Cardiovascular: Negative for chest pain.   Gastrointestinal: Positive for abdominal pain, nausea (minimal by this afternoon) and vomiting. Negative for abdominal distention, blood in stool, constipation and diarrhea.   Genitourinary:  Negative for dysuria.   Musculoskeletal: Negative for gait problem.   Skin: Negative for rash.   Neurological: Negative for dizziness and speech difficulty.   Psychiatric/Behavioral: Positive for sleep disturbance (from nasal symptoms, not cough).       Physical Exam     ED Triage Vitals   BP Heart Rate Heart Rate (via Pulse Ox) Resp Temp Temp src SpO2 (Retired) O2 Device O2 Flow Rate   12/15/16 1757 12/15/16 1757 -- 12/15/16 1757 12/15/16 1757 12/15/16 1757 12/15/16 1757 -- --   127/71 72  16 37 C (98.6 F) TEMPORAL 100 %        Weight           --                               Physical Exam   Constitutional: She is oriented to person, place, and time. She appears well-developed. No distress.   HENT:   Head: Normocephalic.   Mouth/Throat: Oropharynx is clear and moist.   Eyes: Conjunctivae and EOM are normal. No scleral icterus.  Neck: Neck supple.   Cardiovascular: Normal rate, regular rhythm, normal heart sounds and intact distal pulses.    Pulmonary/Chest: Effort normal and breath sounds normal.   Abdominal: Soft. Bowel sounds are normal. She exhibits no distension. There is no tenderness.   CVA tenderness: NO. Incisions are well-adhered and healing well, with only mild pinkness at the incision lines remaining. There is no tenderness at or deep to the incisions.   Musculoskeletal: She exhibits no edema or tenderness (no calf tenderness).   Gait normal.   Lymphadenopathy:     She has no cervical adenopathy.   Neurological: She is alert and oriented to person, place, and time. No cranial nerve deficit (face symmetric, speech clear).   Skin: Skin is warm and dry. No rash noted.   Psychiatric: She has a normal mood and affect. Her behavior is normal.   Nursing note and vitals reviewed.       Medical Decision Making        Initial Evaluation:  ED First Provider Contact     Date/Time Event User Comments    12/15/16 1749 ED First Provider Contact Dorna Bloom Initial Face to Face Provider Contact          Patient  was seen on: 27/13/2018        Assessment:  26 y.o.female [facility] with vomiting a week after URI symptoms    Differential Diagnosis includes viral URI, viral gastroenteritis, wound dehiscence, gastritis, pneumonia                Plan: Likely gastroenteritis portion of viral illness that began over a week ago. Antiemetic prescribed if she needs it, with usual cautions. Nasal steroid to help with congestion that still disturbs her. Rest, hydration, advance to light diet when able. Work note renewed. If any danger signs (reviewed) she will proceed to Emergency Department.     Final Diagnosis  Final diagnoses:   [R11.2] Non-intractable vomiting with nausea, unspecified vomiting type (Primary)   [R09.81] Sinus congestion     Orders Placed This Encounter    ondansetron (ZOFRAN) 4 MG tablet    fluticasone (FLONASE) 50 MCG/ACT nasal spray       No results found for this or any previous visit (from the past 24 hour(s)).      Final Diagnosis    ICD-10-CM ICD-9-CM   1. Non-intractable vomiting with nausea, unspecified vomiting type R11.2 787.01   2. Sinus congestion R09.81 478.19       Rest, hydration, and good hand hygiene recommended.  Symptom relief, warning signs and infection control reviewed.    Please use over the counter medications as discussed.    Please start the new medications as below:  Current Discharge Medication List      New Medications    Details Last Dose Given Next Dose Due Script Given?   fluticasone (FLONASE) 1 spray Dose: 1 spray  1 spray by Nasal route every 12 hours    Quantity 16 g, Refill 0  Start date: 12/15/2016               Continued (NOT CHANGED)    Details Last Dose Given Next Dose Due Script Given?   ondansetron (ZOFRAN) 4 mg Dose: 4 mg  Take 4 mg by mouth 3 times daily as needed for Nausea    Quantity 9 tablet, Refill 0  Start date: 12/15/2016       Comments: Emergency Encounter  Please follow up with your physician as below:  Follow-up Information      Follow up with Jacqulyn Liner, MD.    Specialty:  Family Medicine    Why:  As needed    Contact information:    [address]  [address]  [place] [address]  [phone]       For nasal congestion and cough:  May use any over the counter nasal steroid spray (Flonase, Nasonex, etc). Gargle and spit after use.  Hot then cold compress over cheeks and forehead for 5 minutes each, then gargle with warm salt (1/4 teaspoon per 8 ounces) water.  If secretions are thick, please add Guaifenesin (plain Robitussin, plain Mucinex, others) around the clock until improving, to make mucus thin and move more easily.  Dextromethorphan is a cough suppressant that may cause sleepiness.  To reduce the spread of illness:  Wash hands before and after touching your face, after using the bathroom, or after vomiting.  Do not share beverages, utensils or towels.  Clean bathroom and kitchen surfaces, faucets and doorknobs, with 10% bleach solution (1 part bleach to 9 parts water) or commercial antiseptic solution or wipes such as Lysol.     Thank you Demetra Moya for coming to [facility] for your health care concerns.  If your condition changes and/or worsens please follow up with your primary doctor and/or return to the urgent care center.  If short of breath, chest pains or any other concerns please report to the emergency room.    In the event of an Emergency dial 911.        Dorna Bloom, MD       Dorna Bloom, MD  12/16/16 1259

## 2017-05-07 ENCOUNTER — Emergency Department
Admission: EM | Admit: 2017-05-07 | Discharge: 2017-05-08 | Disposition: A | Discharge status: Home / Self Care | Payer: No Typology Code available for payment source | Source: Ambulatory Visit | Attending: Emergency Medicine | Admitting: Emergency Medicine

## 2017-05-07 ENCOUNTER — Emergency Department: Payer: No Typology Code available for payment source

## 2017-05-07 DIAGNOSIS — S032XXA Dislocation of tooth, initial encounter: Secondary | ICD-10-CM | POA: Insufficient documentation

## 2017-05-07 DIAGNOSIS — Y998 Other external cause status: Secondary | ICD-10-CM | POA: Insufficient documentation

## 2017-05-07 DIAGNOSIS — S0181XA Laceration without foreign body of other part of head, initial encounter: Secondary | ICD-10-CM | POA: Insufficient documentation

## 2017-05-07 DIAGNOSIS — Y92091 Bathroom in other non-institutional residence as the place of occurrence of the external cause: Secondary | ICD-10-CM | POA: Insufficient documentation

## 2017-05-07 DIAGNOSIS — S06330A Contusion and laceration of cerebrum, unspecified, without loss of consciousness, initial encounter: Secondary | ICD-10-CM

## 2017-05-07 DIAGNOSIS — W010XXA Fall on same level from slipping, tripping and stumbling without subsequent striking against object, initial encounter: Secondary | ICD-10-CM

## 2017-05-07 DIAGNOSIS — W182XXA Fall in (into) shower or empty bathtub, initial encounter: Secondary | ICD-10-CM | POA: Insufficient documentation

## 2017-05-07 DIAGNOSIS — F10120 Alcohol abuse with intoxication, uncomplicated: Secondary | ICD-10-CM | POA: Insufficient documentation

## 2017-05-07 DIAGNOSIS — R51 Headache: Secondary | ICD-10-CM

## 2017-05-07 DIAGNOSIS — Y93E1 Activity, personal bathing and showering: Secondary | ICD-10-CM | POA: Insufficient documentation

## 2017-05-07 MED ORDER — IBUPROFEN 400 MG PO TABS *I*
600.0000 mg | ORAL_TABLET | Freq: Once | ORAL | Status: AC
Start: 2017-05-07 — End: 2017-05-08
  Administered 2017-05-08: 600 mg via ORAL
  Filled 2017-05-07 (×2): qty 1

## 2017-05-07 MED ORDER — ACETAMINOPHEN 500 MG PO TABS *I*
1000.0000 mg | ORAL_TABLET | Freq: Once | ORAL | Status: AC
Start: 2017-05-07 — End: 2017-05-07
  Administered 2017-05-07: 1000 mg via ORAL
  Filled 2017-05-07: qty 2

## 2017-05-07 MED ORDER — LIDOCAINE HCL 1 % IJ SOLN *I*
10.0000 mL | Freq: Once | INTRAMUSCULAR | Status: AC
Start: 2017-05-07 — End: 2017-05-07

## 2017-05-07 MED ORDER — LIDOCAINE HCL 1 % IJ SOLN *I*
INTRAMUSCULAR | Status: AC
Start: 2017-05-07 — End: 2017-05-07
  Administered 2017-05-07: 10 mL via SUBCUTANEOUS
  Filled 2017-05-07: qty 10

## 2017-05-07 NOTE — ED Provider Notes (Addendum)
History     Chief Complaint   Patient presents with    Fall    Facial Laceration     HPI Comments: April Craig is a 26 y/o female with PMH of depression presents today after fall. Patient was walking to the kitchen to prepare dinner around 830PM today when she slipped and fell. Patient recalls landing her chin on the counter and saw the blood and temporary blacked out. Patient denies hitting the back of her head, but cannot recall if she passed out after the injury or not. Patient denies feeling dizzy or light headed prior to the incident. Patient reports a 8/10 sharp pain over her lower jaw where she has a laceration with protruding fatty tissue, and reports 3 loose teeth: #8,9. Patient has some headache which the patient thinks is secondary to her fall, no nausea, no dizziness, no shortness of breath, no chest pain, no neck pain, and patient denies injury elsewhere.     History provided by:  Patient and significant other      Medical/Surgical/Family History     Past Medical History:   Diagnosis Date    Asthma     childhood         Patient Active Problem List   Diagnosis Code    Appendicitis s/p lap appy 10/29/16 K37            Past Surgical History:   Procedure Laterality Date    APPENDECTOMY       No family history on file.       Social History   Substance Use Topics    Smoking status: Never Smoker    Smokeless tobacco: Never Used    Alcohol use Yes     Living Situation     Questions Responses    Patient lives with     Homeless     Caregiver for other family member     External Services     Employment Employed    Domestic Violence Risk                 Review of Systems   Review of Systems   Constitutional: Negative for chills and fever.   HENT: Positive for dental problem (loose teeth #8,9,10). Negative for trouble swallowing.    Eyes: Negative for visual disturbance.   Respiratory: Negative for chest tightness and shortness of breath.    Cardiovascular: Negative for chest pain.   Gastrointestinal:  Negative for abdominal pain.   Genitourinary: Negative for difficulty urinating and dysuria.   Musculoskeletal: Negative for back pain and neck pain.   Skin: Positive for wound (lower chin wound). Negative for color change.   Neurological: Positive for headaches. Negative for dizziness, tremors, weakness and numbness.   Hematological: Does not bruise/bleed easily.   Psychiatric/Behavioral: Negative for confusion.       Physical Exam     Triage Vitals  Triage Start: Start, (05/07/17 2052)   First Recorded BP: 149/76, Resp: 17, Temp: 36.9 C (98.4 F), Temp src: TEMPORAL Oxygen Therapy SpO2: 100 %, Oximetry Source: Rt Hand, O2 Device: None (Room air), Heart Rate: 91, (05/07/17 2052)  .  First Pain Reported  0-10 Scale: 10, (05/07/17 2052)       Physical Exam   Constitutional: She is oriented to person, place, and time. She appears well-developed and well-nourished.   HENT:   Head: Normocephalic and atraumatic. Head is without raccoon's eyes and without Battle's sign.       Mouth/Throat: Uvula  is midline and oropharynx is clear and moist. No trismus in the jaw. Lacerations (superficial) present.       No hemotympanum b/l   Eyes: EOM are normal. Pupils are equal, round, and reactive to light.   Neck: Normal range of motion. Neck supple.   No cervical spine tenderness   Cardiovascular: Normal rate, regular rhythm and normal heart sounds.    Pulmonary/Chest: Effort normal and breath sounds normal.   Abdominal: Soft. Bowel sounds are normal. She exhibits no distension. There is no tenderness.   Musculoskeletal: Normal range of motion.   No c/t/l spine tenderness   Neurological: She is alert and oriented to person, place, and time. She has normal strength. She displays normal reflexes. No cranial nerve deficit or sensory deficit. Coordination and gait normal.   Skin: Skin is warm and dry.   Psychiatric:   No evidence of intoxication   Nursing note and vitals reviewed.      Medical Decision Making        Initial  Evaluation:  ED First Provider Contact     Date/Time Event User Comments    05/07/17 2244 ED First Provider Contact SHIH, ALBERT Initial Face to Face Provider Contact          Patient seen by me on arrival date of 05/07/2017.    Assessment:  26 y.o.female comes to the ED with facial laceration and loose teeth. Subluxed teeth is concerning, obtain imaging and consult dental. Concern for head injury due to questionable LOC and head impact. There is also concern for mandibular fracture due to the mechanism, obtain CT maxillofacial w/o contrast.      Differential Diagnosis includes:  Laceration to chin  Mandibular fracture  Lip laceration  Subluxation of teeth  Intracranial bleeding  Jaw fracture      Plan: Tylenol PO for pain, CT maxillofacial, consult facial trauma, consult dental, reassess.  She is UTD on tdap vaccine (<5 years).    Threasa BeardsAlbert Shih, MD      Resident Attestation:    Patient seen by me on 05/07/2017.    History:  I reviewed this patient, reviewed the resident's note and agree.    Exam:  I examined this patient, reviewed the resident's note and agree with edits above.    Decision Making:  I discussed with the resident his/her documented decision making and agree with edits above.      Author:  Bluford KaufmannJoseph Analuisa Tudor, DO       Threasa BeardsShih, Albert, MD  Resident  05/08/17 0013       Bluford KaufmannPereira, Daud Cayer, DO  05/08/17 [phone]

## 2017-05-07 NOTE — Provider Consult (Addendum)
Consultation  ________________________________________     Consult Requested by: Luiz Blare, DO  Consult Attending: Chrystie Nose, MD  Consult Question: chin laceration      Chief Complaint: fall     History of Present Illness: April Craig is a 26 y.o. female with a history of depression who sustained an intoxicated fall in the shower, striking her chin on the side of the tub.  Unclear if she loss consciousness.  She had two drinks prior the the accident.  Endorses loose right central incisor.  Denies paresthesia, nausea, vomiting, malocclusion.       Review of Systems: Negative except as noted in HPI.     Past Medical History:   Past Medical History:   Diagnosis Date    Asthma     childhood      Past Surgical History:   Past Surgical History:   Procedure Laterality Date    APPENDECTOMY        Family History: No family history on file.  Social History:   Social History   Substance Use Topics    Smoking status: Never Smoker    Smokeless tobacco: Never Used    Alcohol use Yes      Medications:    lidocaine  10 mL Subcutaneous Once    lidocaine        ibuprofen  600 mg Oral Once     Prior to Admission medications    Medication Sig Start Date End Date Taking? Authorizing Provider   ondansetron (ZOFRAN) 4 MG tablet Take 1 tablet (4 mg total) by mouth 3 times daily as needed for Nausea 12/15/16   Allayne Gitelman, MD   fluticasone [facility]) 50 MCG/ACT nasal spray 1 spray by Nasal route every 12 hours 12/15/16   Allayne Gitelman, MD   oxyCODONE (ROXICODONE) 5 MG immediate release tablet Take 1 tablet (5 mg total) by mouth every 4 hours as needed for Pain   Max daily dose: 30 mg 10/30/16   Luitje, Cherre Robins, MD   citalopram (CELEXA) 40 MG tablet Take 40 mg by mouth daily    [provider]   norethindrone-ethinyl estradiol (JUNEL FE 1/20) 1-20 MG-MCG per tablet Take 1 tablet by mouth daily    [provider]   clonazePAM (KLONOPIN) 0.5 MG disintegrating tablet Take 0.5 mg by mouth  2 times daily as needed for Anxiety    [provider]     Allergies:   Allergies   Allergen Reactions    Amoxicillin Hives    Sulfa Antibiotics Itching        Physical Examination:  Patient Vitals for the past 4 hrs:   BP Temp Temp src Pulse Resp SpO2 Height Weight   05/07/17 2052 149/76 36.9 C (98.4 F) TEMPORAL 91 17 100 % 1.753 m ('5\' 9"' ) 79.4 kg (175 lb)     Body mass index is 25.84 kg/(m^2).     General: sitting in bed, in NAD  Neuro: no focal deficits   CV: RRR  Resp: unlabored on RA   Abd: soft   Ext: WWP    HEENT/Face/CN: The patient is able to cooperate with exam.  General appearance/lacerations: 2.5 cm cresenteric full-thickness laceration of the chin.  Exposed SQ.  No active bleeding.  Loose right central incisor s/p bonding.    Cervical collar: no  Visual acuity: grossly intact  Pupils: equal, round, reactive  EOM: intact without signs of entrapment  CNV: sensation intact in V1-3 bilaterally  CNVII: muscles of facial  expression symmetric  Malocclusion: No  Bony stepoffs: No  Other: hearing intact grossly, palate elevates symmetrically, shoulder shrug/SCM symmetric, tongue midline     Lab Results:  No results for input(s): WBC, HGB, HCT, PLT, PTT, PTI, INR, ESR, CRP in the last 72 hours. No results for input(s): NA, K, CL, CO2, UN, CREAT, GLU, CA, MG, PO4 in the last 72 hours. No results for input(s): ALB, PALB in the last 72 hours.   Imaging: Relevant imaging, where available, was reviewed. Of note, no fractures.       Procedure:    Anesthesia: local anesthesia and/or nerve block, without conscious sedation  EBL: minimal  Complications: none  Description: After performing verbal consent and a time-out, the wound was anesthetized with 3 ml of 1% lidocaine with 1:100,000 epinephrine. Following inspection for foreign debris (and removal, if present), the wound was then irrigated copiously with normal saline. The area was then prepped and draped in the usual sterile fashion. The wound was  conservatively debrided of devitalized tissues.  The chin laceration was reapproximated with 5-0 monocryl deep dermal, then 5-0 fast gut suture.  Following this, the area was cleansed and antibiotic ointment was applied. The patient tolerated the procedure well.     Impression: April Craig is a 26 y.o. female s/p fall with chin laceration s/p repair.    Summary of Facial Fractures/Lacerations:   2.5 cm crescenteric chin laceration s/p repair    Loose right central incisor s/p bonding     Recommendations:   Trauma triage and evaluation per ED/ACS/primary team.   Local wound care to lacerations with gentle cleansing and antibiotic ointment TID.   Please update tetanus vaccination if indicated.   Antibiotic per Dental.     Follow up with Dr. Antonietta Breach in 10 days. Call [phone] for an appointment.    Thank you for the opportunity to participate in this patient's care. Please contact the plastic surgery consult resident with questions or concerns.     Shelby Mattocks, MD   Plastic and Reconstructive Surgery

## 2017-05-07 NOTE — ED Provider Progress Notes (Signed)
ED Provider Progress Note    Shared decision making engaged with patient, patient does not want CT head. Clinically sober, no focal neurological deficits, alert and oriented x4.     CT maxillofacial obtained, facial trauma consulted, dental consulted for teeth subluxation and complex facial wound with suspicion for mandibular fracture. Provided Toradol 30mg  IM for pain.    Patient signed out to night team.    Plan: CT Maxillofacial, contact facial trauma for repair, contact dental for follow up.     Threasa BeardsAlbert Cuinn Westerhold, MD, 05/07/2017, 11:54 PM         Threasa BeardsShih, Kenniel Bergsma, MD  Resident  05/08/17 [phone]

## 2017-05-07 NOTE — ED Notes (Signed)
Pt had fall in shower, hit chin, denies LOC. Front teeth noted to be loose and bleeding. Lac to chin with dressing on it, bleeding controlled.

## 2017-05-07 NOTE — ED Triage Notes (Signed)
Fell in shower after 2 alcoholic drinks, struck chin on side of tub.  Has lac to chin.  Denies LOC, denies headache.  Couple loose teeth.  Speaking complete sentences.  Tearful at triage.  Here with family.       Triage Note   Fredric MareEric Alicianna Litchford, RN

## 2017-05-08 ENCOUNTER — Encounter: Payer: Self-pay | Admitting: Student in an Organized Health Care Education/Training Program

## 2017-05-08 DIAGNOSIS — S0181XA Laceration without foreign body of other part of head, initial encounter: Secondary | ICD-10-CM

## 2017-05-08 DIAGNOSIS — S0083XA Contusion of other part of head, initial encounter: Secondary | ICD-10-CM

## 2017-05-08 MED ORDER — LIDOCAINE-EPINEPHRINE 1 %-1:100000 IJ SOLN *I*
INTRAMUSCULAR | Status: AC
Start: 2017-05-08 — End: 2017-05-08
  Administered 2017-05-08: 1 mL via SUBCUTANEOUS
  Filled 2017-05-08: qty 20

## 2017-05-08 MED ORDER — KETOROLAC TROMETHAMINE 30 MG/ML IJ SOLN *I*
30.0000 mg | Freq: Once | INTRAMUSCULAR | Status: DC
Start: 2017-05-08 — End: 2017-05-08

## 2017-05-08 MED ORDER — LIDOCAINE-EPINEPHRINE 1 %-1:100000 IJ SOLN *I*
1.0000 mL | Freq: Once | INTRAMUSCULAR | Status: AC
Start: 2017-05-08 — End: 2017-05-08

## 2017-05-08 MED ORDER — KETOROLAC TROMETHAMINE 30 MG/ML IJ SOLN *I*
30.0000 mg | Freq: Once | INTRAMUSCULAR | Status: AC
Start: 2017-05-08 — End: 2017-05-08
  Administered 2017-05-08: 30 mg via INTRAMUSCULAR
  Filled 2017-05-08: qty 1

## 2017-05-08 MED ORDER — CLINDAMYCIN HCL 300 MG PO CAPS *I*
300.0000 mg | ORAL_CAPSULE | Freq: Three times a day (TID) | ORAL | 0 refills | Status: AC
Start: 2017-05-08 — End: 2017-05-13

## 2017-05-08 NOTE — ED Notes (Signed)
Pt A&Ox4, independently ambulatory w steady gait, VSS, verbalized understanding of d/c, NAD

## 2017-05-08 NOTE — Code Documentation (Signed)
[facility] Resident Consult Note    Name of pt:  April Craig, April Craig  MRN#:   [id number]  DOB:    Aug 08, 1991    Referred from: Threasa Beards, MD (ext [phone]) ED    Reason for consultation:  Patient secured facial trauma. Patient fell in shower after 2 alcoholic drinks, struck chin on side of tub.  Has laceration to chin, denied LOC, denied headache. Patient has few loose teeth.        Chief Complaint: I fell in the shower after 2 alcoholic drinks, I cant recall if I fainted or not. My top tooth on the right side feels loose and my teeth didnt look like this before.       Dental History: Not assessed     Medical History:  Appendicitis s/p lap appy 10/29/16  Depression               Lab. Tests:     No recent cbc or metablolic panel in eRecord.     Allergies:   Amoxicillin  Sulfa Antibiotics  Clinical Evaluation:  Submandibular lymph nodes: non tender, WNL  Face and neck: symmetric, laceration to chin with dressing on it, bleeding controlled  Lips: hydrated  TMJ: limited opening due to facial trauma  Oropharynx - WNL  Oral mucosa: small laceration of palatal gingiva of #7, bruising in the vestibule of #24-26  Limited evaluation of site #6-11:  All teeth intact except #8 which was displaced inferiorly and lingually ~45mm, moderate mobility, tender to palpation, all other teeth were not mobile    Radiographs:   CT Scan (05/08/2017) in eRecord    No acute fracture of the maxillofacial skeleton. Soft tissue contusion/laceration involving the chin.     Diagnosis: Extrusion #8    Assessment: Patient is a 26 y/o female with PMH of depression presents today after fall. Patient was walking to the kitchen to prepare dinner around 8:30PM today when she slipped and fell. Patient recalls landing her chin on the counter and saw the blood and temporary blacked out. Patient denies hitting the back of her head, but cannot recall if she passed out after the injury or not. Patient denies feeling dizzy or light headed prior to the incident. All teeth  intact except #8 which was displaced inferiorly and lingually ~35mm, moderate mobility, tender to palpation, all other teeth were not mobile.     Treatment: Irrigated site with saline and applied topical benzocaine. Local anesthesia given with 1 carpule of 2% lidocaine with 1:100k epi max buccal and palatal infiltration. Placed a splint using flexible orthodontic wire from #6-11 while repositioning #8 back into socket. Advised ED team of pain medications and clindomycin. Advised patient for soft diet and follow up with urgent care in 2 weeks for splint removal and further evaluation of teeth #6-11.

## 2017-05-08 NOTE — Discharge Instructions (Signed)
Laceration Care Instructions     Wash your wounds daily with gentle soap (e.g. Johnson's baby soap, Cetaphil, Dove, etc). Do not vigorously rub your stitches/staples.   Apply antibiotic ointment to your lacerations 2-3 times per day to keep it moist. This will improve your wound healing.   Follow up with Dr. Silverio Decamphristiano in 10 days to have your wounds evaluated and/or stitches removed. Call [phone] for an appointment.   Avoid sun exposure until further notice. To decrease visible scarring in the future, you will need to apply sunblock daily after your wounds are fully healed.   Smoking can reduce the quality of your wound healing and increase your chances of wound infections, as well as increase your chances of developing chronic health problems or worsen conditions you already have. If you smoke, you should quit. Smoking cessation information is available for your review to help you quit. Medications to help you quit are available. Ask your doctor Marciano Sequin(Foote, Elmer SowMichael M, MD) if you would like to receive these medications.   If you are diabetic, check your blood sugar at least daily. Poor blood sugar control can lead to delayed wound healing. Follow up with Jacqulyn LinerFoote, Michael M, MD to discuss your diabetic care regimen.    Author: Majel HomerBao Huu Harveer Sadler, MD as of: 05/08/2017  at: 2:17 AM

## 2017-05-08 NOTE — ED Notes (Signed)
Pt with dental in [id number]

## 2018-02-15 ENCOUNTER — Other Ambulatory Visit: Payer: Self-pay | Admitting: Obstetrics and Gynecology

## 2019-12-28 ENCOUNTER — Ambulatory Visit: Payer: Self-pay

## 2019-12-28 ENCOUNTER — Other Ambulatory Visit: Payer: Self-pay

## 2019-12-28 ENCOUNTER — Ambulatory Visit: Payer: 59 | Attending: Internal Medicine

## 2019-12-28 DIAGNOSIS — Z23 Encounter for immunization: Secondary | ICD-10-CM

## 2019-12-28 NOTE — Progress Notes (Signed)
   Covid-19 Vaccination Clinic  Name:  Vickie Mathis    MRN: [id number] DOB: 1991-07-24  12/28/2019  Ms. Rozenberg was observed post Covid-19 immunization for 15 minutes without incident. She was provided with Vaccine Information Sheet and instruction to access the V-Safe system.   Ms. Luviano was instructed to call 911 with any severe reactions post vaccine: Marland Kitchen Difficulty breathing  . Swelling of face and throat  . A fast heartbeat  . A bad rash all over body  . Dizziness and weakness   Immunizations Administered    Name Date Dose VIS Date Route   Pfizer COVID-19 Vaccine 12/28/2019  8:40 AM 0.3 mL 09/15/2019 Intramuscular   Manufacturer: [employer], [employer]   Lot: [id number]   [place]: [id number]

## 2020-01-23 ENCOUNTER — Ambulatory Visit: Payer: 59 | Attending: Internal Medicine

## 2020-01-23 DIAGNOSIS — Z23 Encounter for immunization: Secondary | ICD-10-CM

## 2020-01-23 NOTE — Progress Notes (Signed)
   Covid-19 Vaccination Clinic  Name:  Vickie Mathis    MRN: [id number] DOB: 09-Mar-1991  01/23/2020  [name] was observed post Covid-19 immunization for 15 minutes without incident. She was provided with Vaccine Information Sheet and instruction to access the V-Safe system.   [name] was instructed to call 911 with any severe reactions post vaccine: Marland Kitchen Difficulty breathing  . Swelling of face and throat  . A fast heartbeat  . A bad rash all over body  . Dizziness and weakness   Immunizations Administered    Name Date Dose VIS Date Route   Pfizer COVID-19 Vaccine 01/23/2020  4:32 PM 0.3 mL 11/29/2018 Intramuscular   Manufacturer: [place]   Lot: [id number]   [place]: [id number]

## 2020-07-01 DIAGNOSIS — N6320 Unspecified lump in the left breast, unspecified quadrant: Secondary | ICD-10-CM | POA: Insufficient documentation

## 2020-07-01 DIAGNOSIS — F4323 Adjustment disorder with mixed anxiety and depressed mood: Secondary | ICD-10-CM | POA: Insufficient documentation

## 2020-07-04 ENCOUNTER — Other Ambulatory Visit: Payer: Self-pay | Admitting: Family Medicine

## 2020-07-04 DIAGNOSIS — N6320 Unspecified lump in the left breast, unspecified quadrant: Secondary | ICD-10-CM

## 2020-11-05 ENCOUNTER — Other Ambulatory Visit: Payer: Self-pay | Admitting: Family Medicine

## 2020-11-05 DIAGNOSIS — C50919 Malignant neoplasm of unspecified site of unspecified female breast: Secondary | ICD-10-CM

## 2020-11-05 DIAGNOSIS — N6320 Unspecified lump in the left breast, unspecified quadrant: Secondary | ICD-10-CM

## 2020-11-05 HISTORY — DX: Malignant neoplasm of unspecified site of unspecified female breast: C50.919

## 2020-11-07 ENCOUNTER — Other Ambulatory Visit: Payer: 59

## 2020-11-12 ENCOUNTER — Other Ambulatory Visit: Payer: Self-pay

## 2020-11-12 ENCOUNTER — Ambulatory Visit
Admission: RE | Admit: 2020-11-12 | Discharge: 2020-11-12 | Disposition: A | Discharge status: Home / Self Care | Payer: 59 | Source: Ambulatory Visit | Attending: Family Medicine | Admitting: Family Medicine

## 2020-11-12 DIAGNOSIS — N6320 Unspecified lump in the left breast, unspecified quadrant: Secondary | ICD-10-CM

## 2020-11-12 IMAGING — US US BREAST*L* LIMITED INC AXILLA
1 series · 13 of 15 positions shown · non-contrast
Comparison: None.

CLINICAL DATA: 29-year-old female presenting for evaluation of a
palpable lump in the left breast.

EXAM:
DIGITAL DIAGNOSTIC BILATERAL MAMMOGRAM WITH TOMOSYNTHESIS AND CAD;
ULTRASOUND LEFT BREAST LIMITED
TECHNIQUE: Bilateral digital diagnostic mammography and breast tomosynthesis
was performed. The images were evaluated with computer-aided
detection.; Targeted ultrasound examination of the left breast was
performed.

[Series 1: us breast*left* limited inc axilla · 0.06mm/px · 13 of 15 slices shown]
[im 1/15]
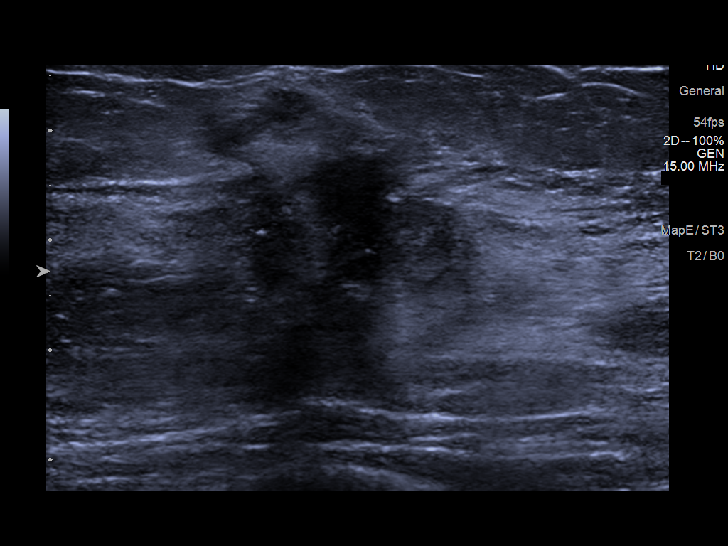
[im 2/15]
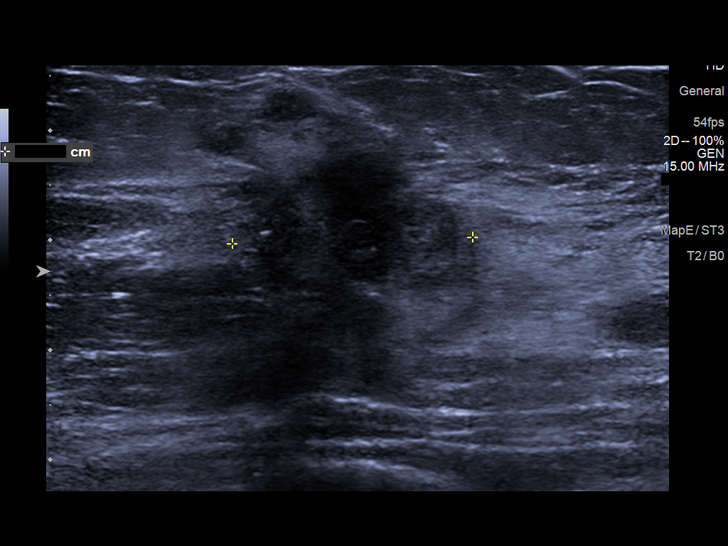
[im 3/15]
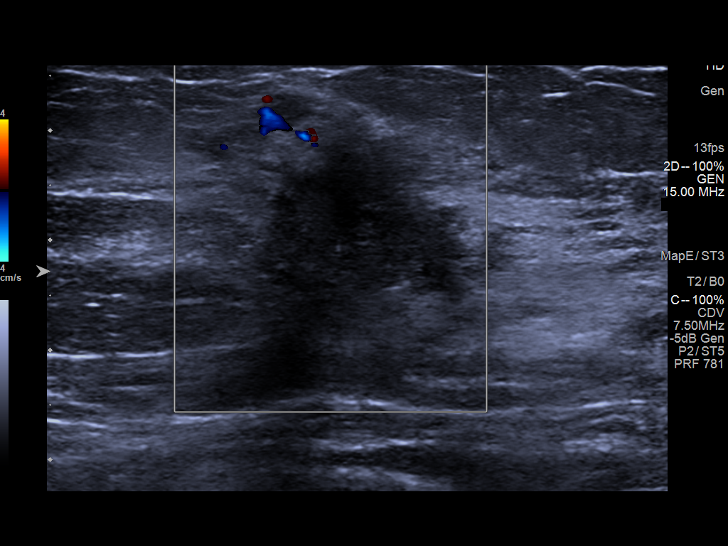
[im 5/15]
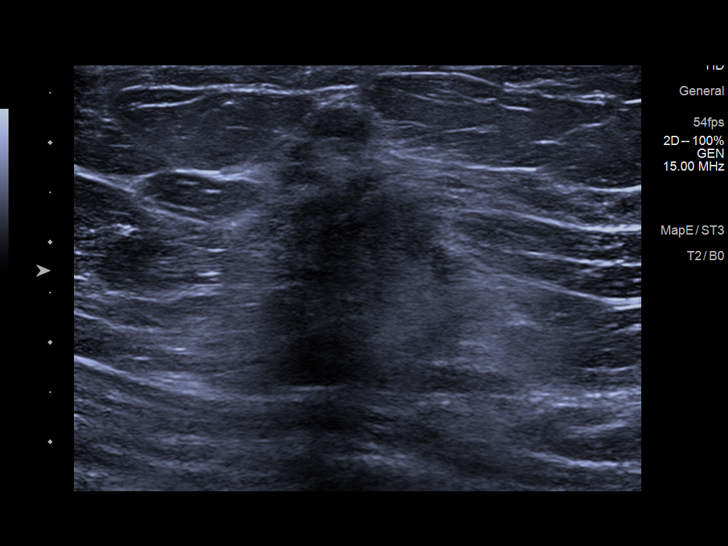
[im 6/15]
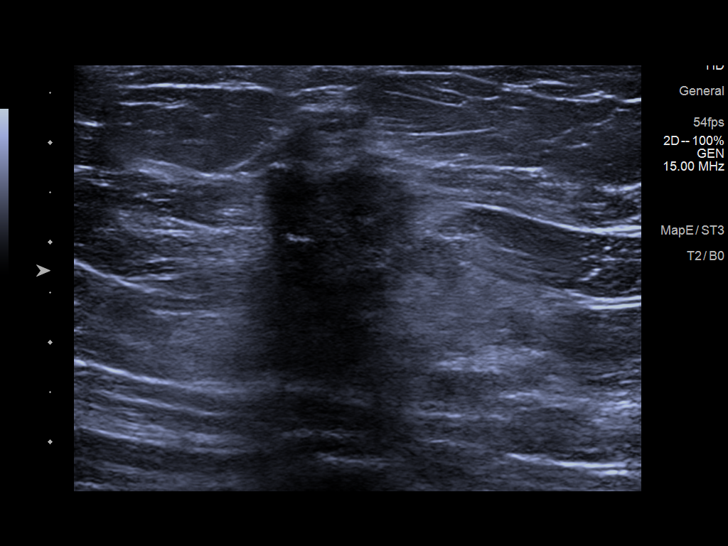
[im 7/15]
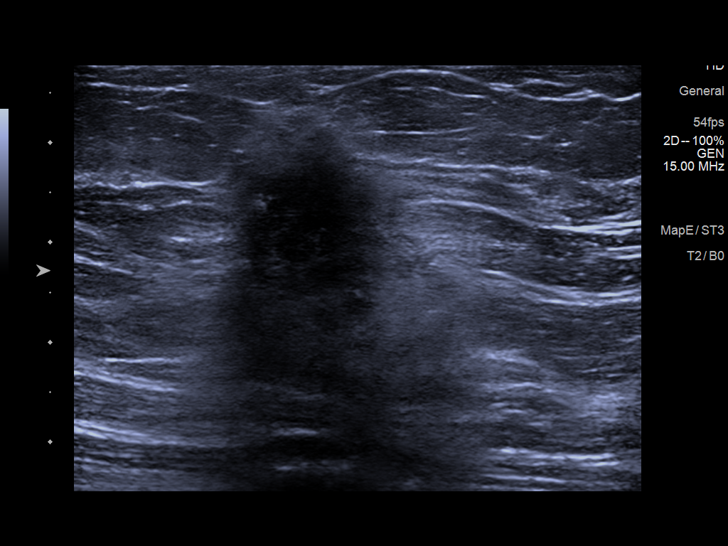
[im 8/15]
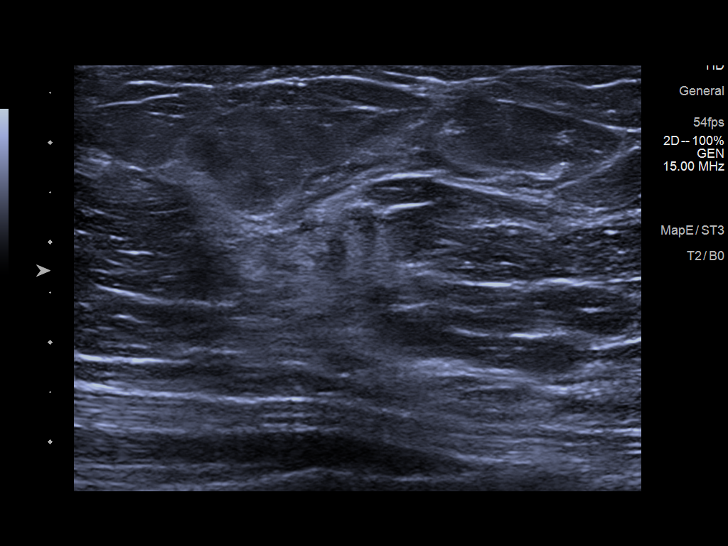
[im 9/15]
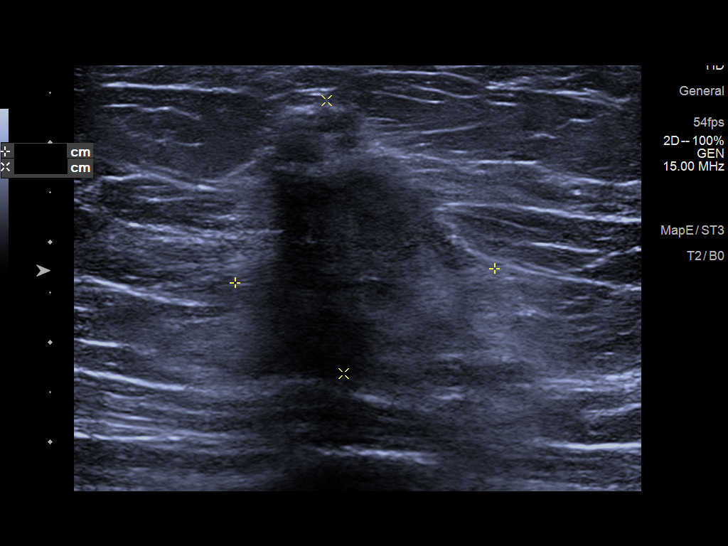
[im 10/15]
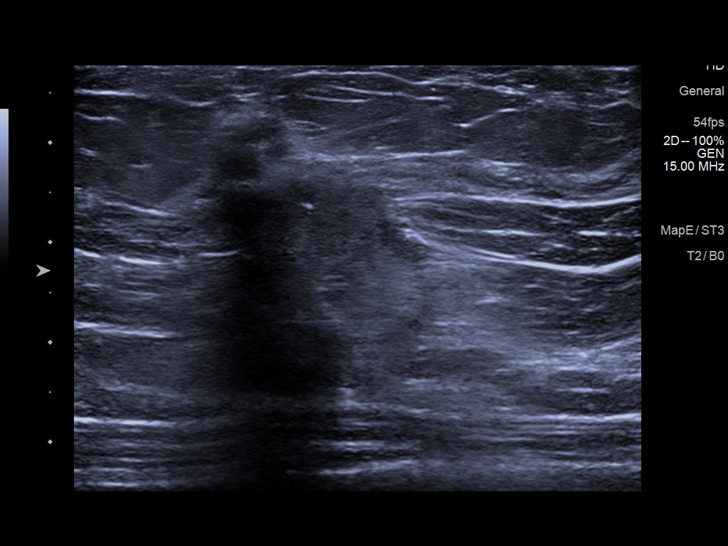
[im 11/15]
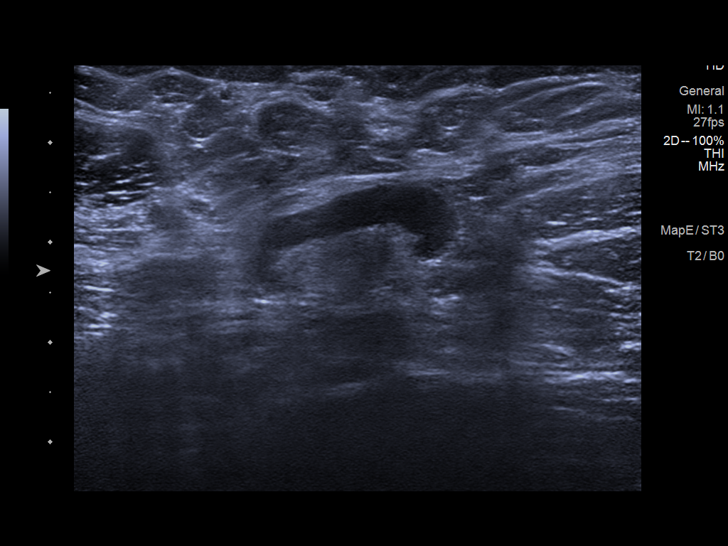
[im 13/15]
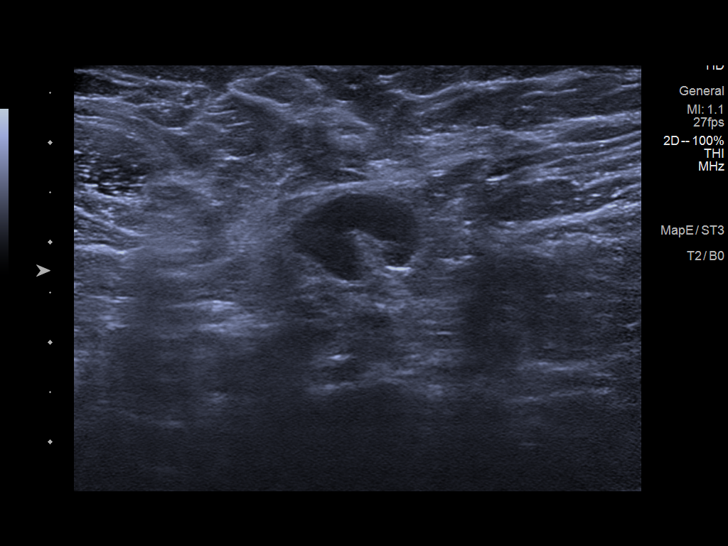
[im 14/15]
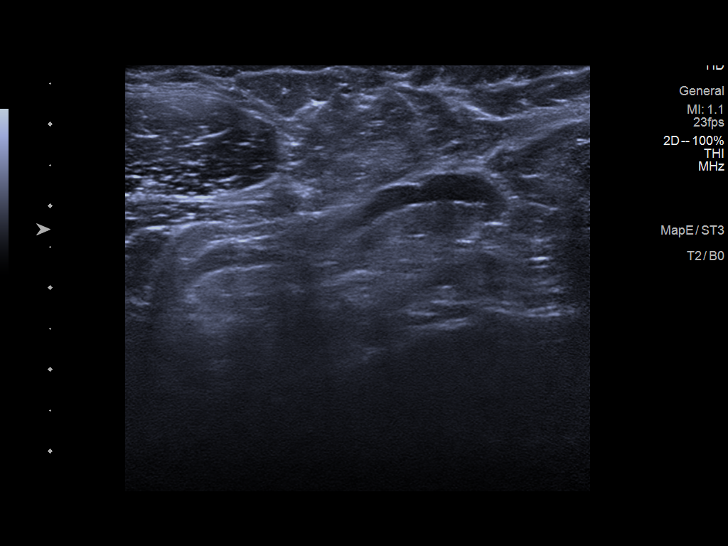
[im 15/15]
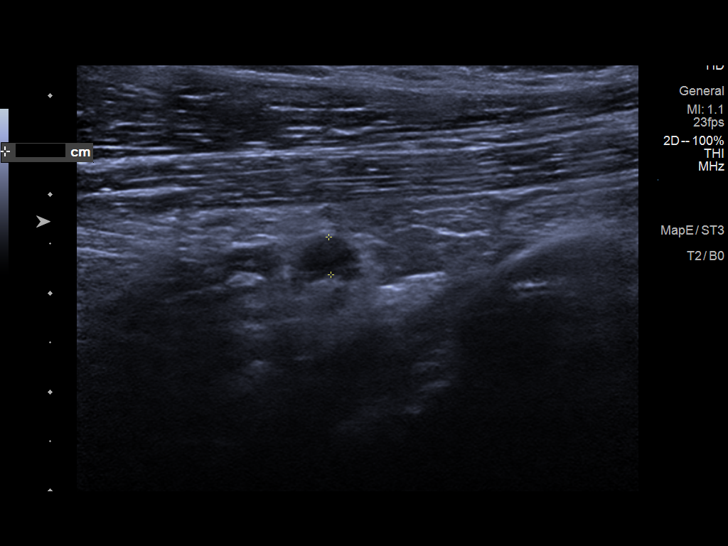

[13 of 15 positions shown; findings below may reference images not displayed]

ACR Breast Density Category c: The breast tissue is heterogeneously
dense, which may obscure small masses.
FINDINGS: Deep to the palpable marker in the superior left breast, there is an
irregular mass with spiculated margins and associated calcifications
at the 12 to 1 o'clock position. Skin thickening is noted in the
lower-inner quadrant of the left breast. A small asymmetry was seen
in the lateral aspect of the left breast on the initial CC view,
which resolves on spot compression tomosynthesis imaging. No
suspicious calcifications, masses or areas of distortion are seen in
the right breast.

Physical exam demonstrates a firm palpable lump in the superior left
breast. No rashes or erythema is noted in the lower-inner quadrant
of the left breast in the region of skin thickening.

Ultrasound targeted to the left breast at 12 o'clock, 6 cm from the
nipple demonstrates an irregular hypoechoic mass with surrounding
echogenicity measuring 2.7 x 2.6 x 2.2 cm. Ultrasound of the left
axilla demonstrates 2 lymph nodes with mildly thickened cortices of
4 mm.
IMPRESSION: 1. There is a highly suspicious 2.7 cm mass in the left breast at 12
o'clock.

2. There are 2 lymph nodes with slightly thickened cortices of 4 mm
in the left axilla.

3. There is skin thickening in the lower-inner quadrant of the left
breast on mammography.

4.  No evidence of malignancy in the right breast.

RECOMMENDATION:
1. Ultrasound-guided biopsy is recommended for the left breast mass
at 12 o'clock.

2. Ultrasound-guided biopsy is recommended for 1 of the mildly
thickened lymph nodes in the left axilla.

3. If the ultrasound-guided biopsies demonstrate malignancy,
consider skin punch biopsy of the region of thickening in the
lower-inner quadrant of the left breast, if this would alter
clinical management.

Our schedulers contact the patient to schedule these appointments at
the patient's earliest convenience.

I have discussed the findings and recommendations with the patient.
If applicable, a reminder letter will be sent to the patient
regarding the next appointment.

BI-RADS CATEGORY  5: Highly suggestive of malignancy.

## 2020-11-12 IMAGING — MG DIGITAL DIAGNOSTIC BILAT W/ TOMO W/ CAD
8 of 14 series · 8 of 40 positions shown · non-contrast
Comparison: None.

CLINICAL DATA: 29-year-old female presenting for evaluation of a
palpable lump in the left breast.

EXAM:
DIGITAL DIAGNOSTIC BILATERAL MAMMOGRAM WITH TOMOSYNTHESIS AND CAD;
ULTRASOUND LEFT BREAST LIMITED
TECHNIQUE: Bilateral digital diagnostic mammography and breast tomosynthesis
was performed. The images were evaluated with computer-aided
detection.; Targeted ultrasound examination of the left breast was
performed.

[L CC synth-2D (1 of 2)]
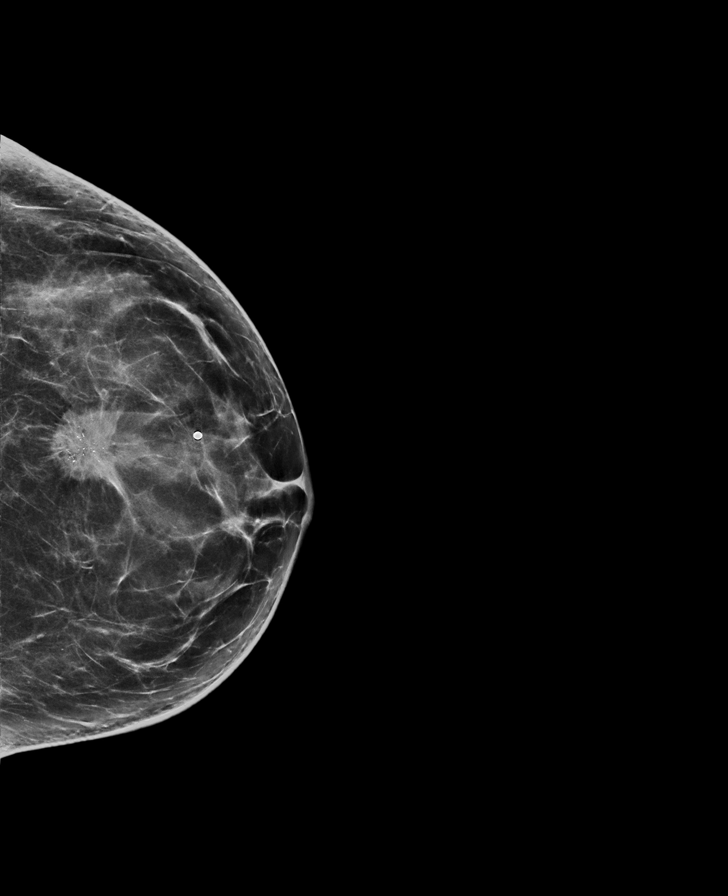

[L ML synth-2D]
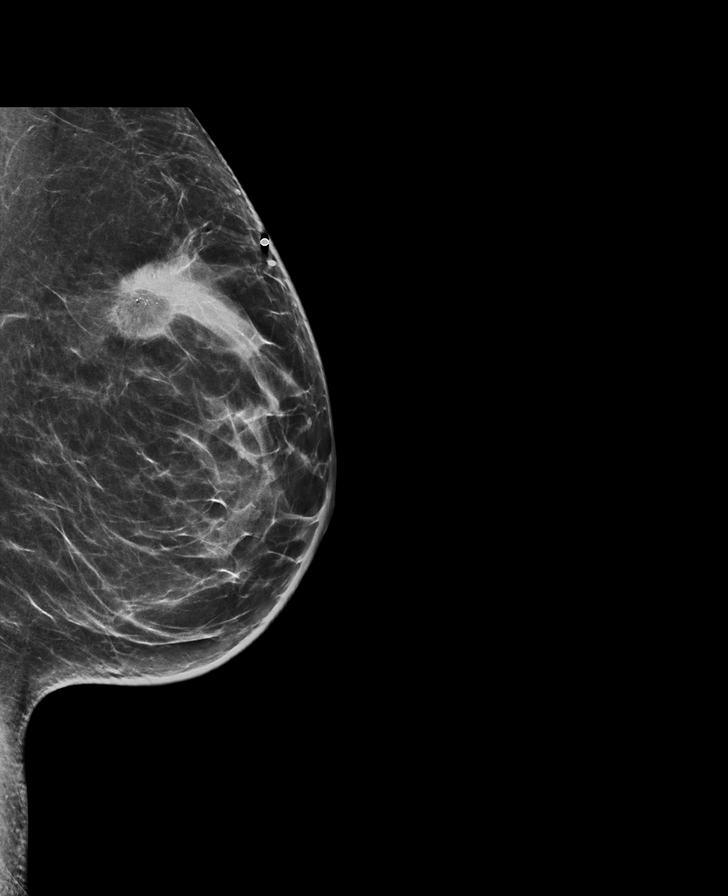

[R CC synth-2D]
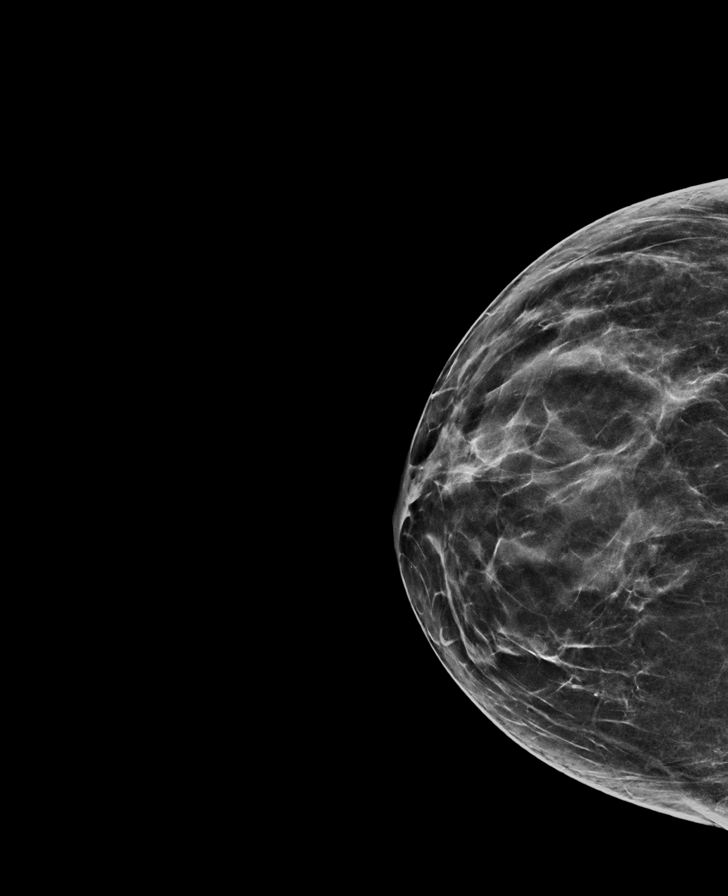

[R MLO synth-2D]
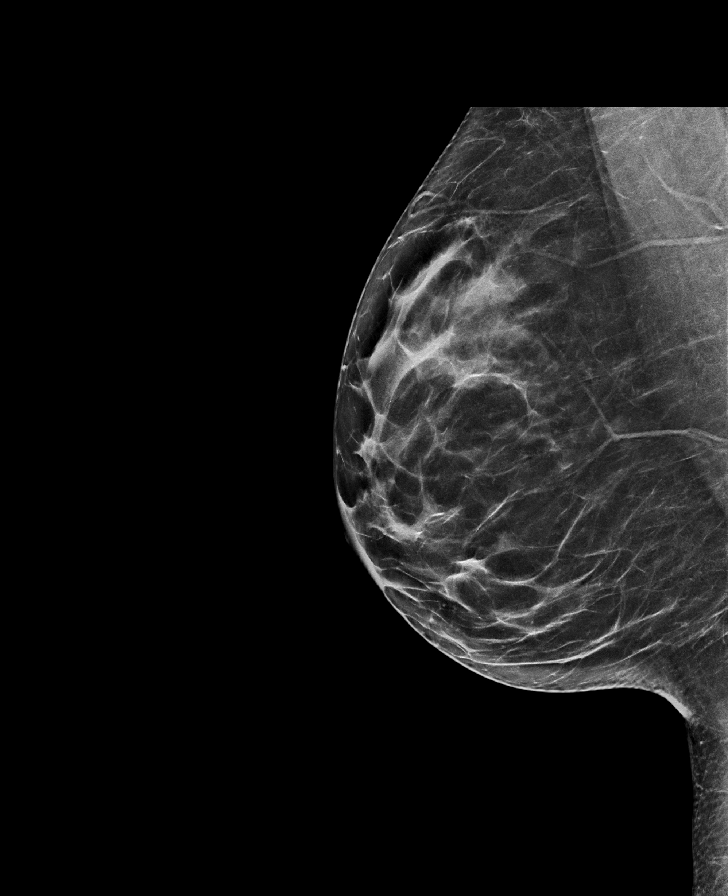

[L CC synth-2D (2 of 2)]
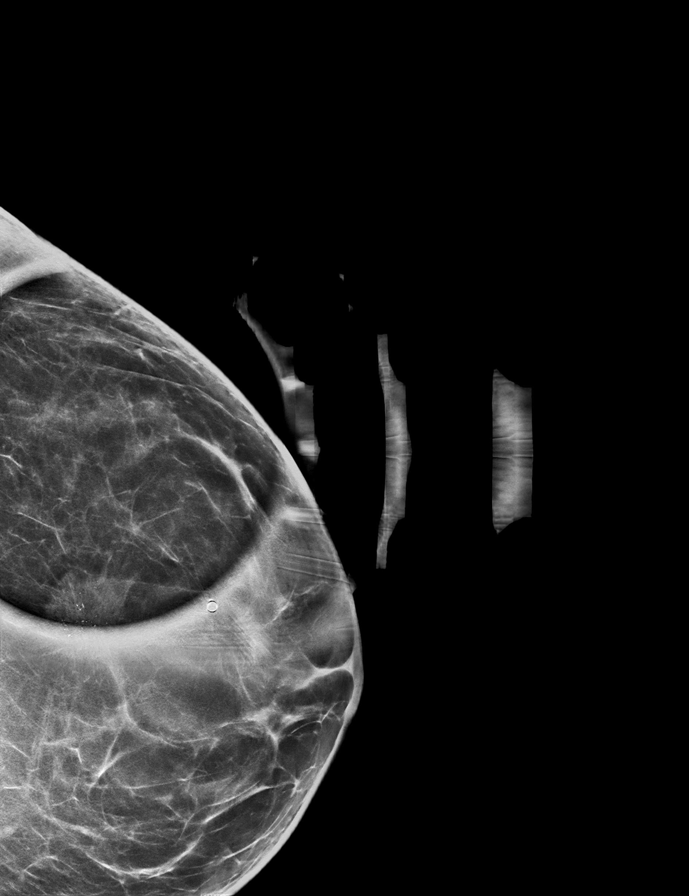

[L TAN synth-2D]
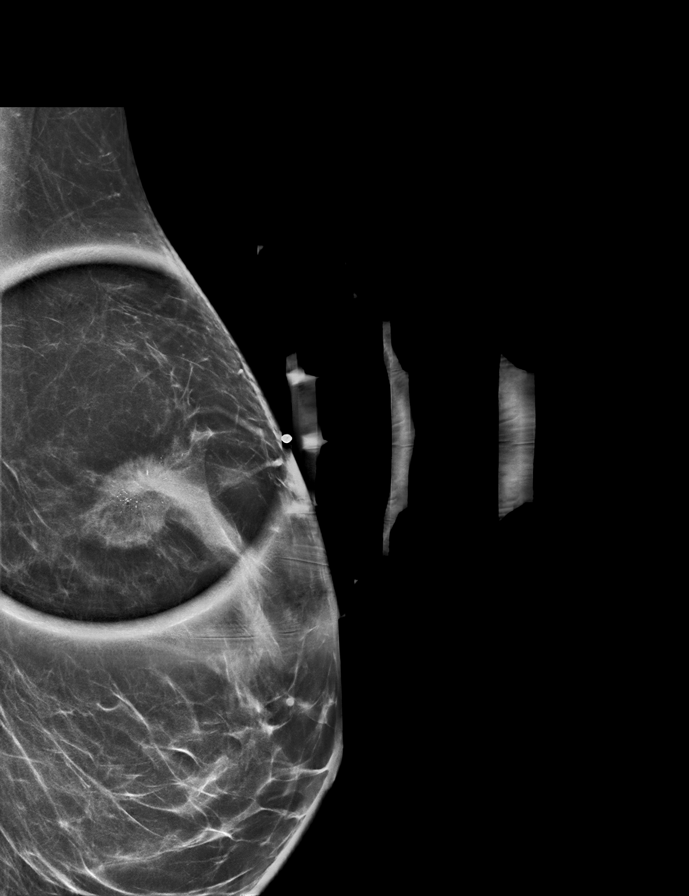

[L MLO synth-2D]
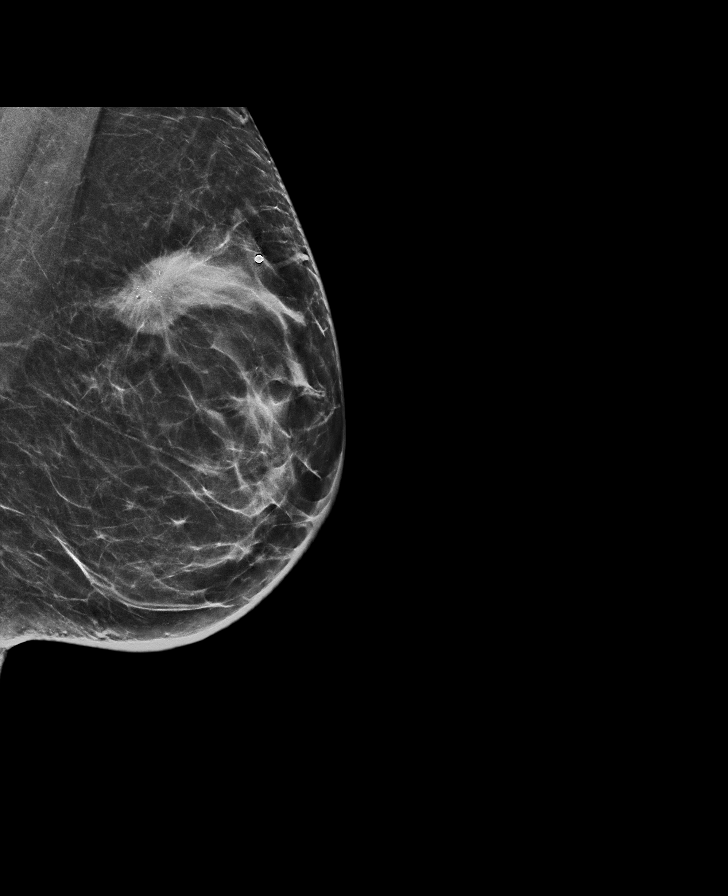

[L MLO tomo · tomo slice 37/72.0]
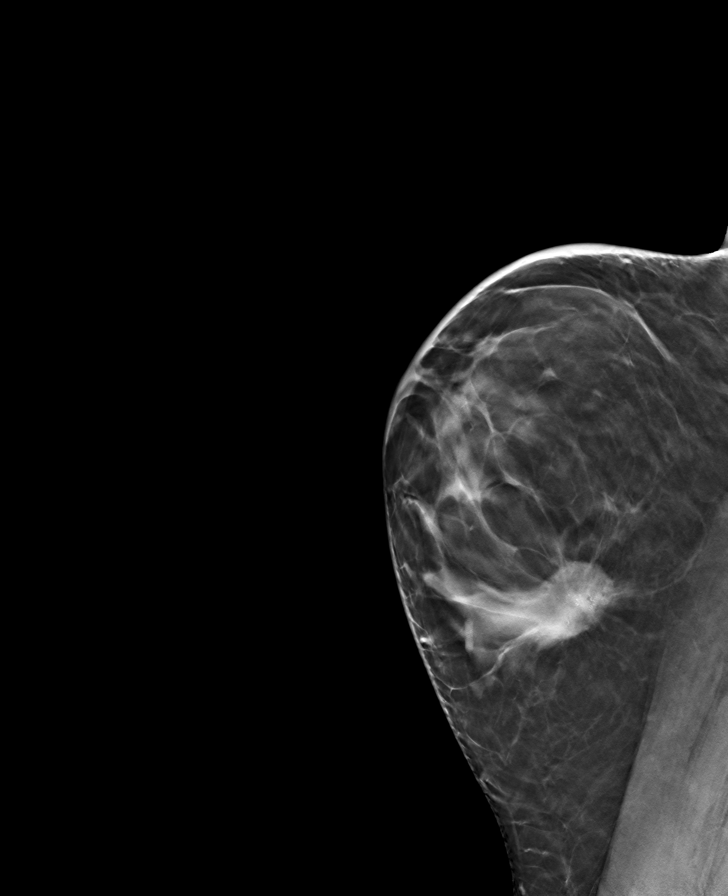

[8 of 40 positions shown; findings below may reference images not displayed]

ACR Breast Density Category c: The breast tissue is heterogeneously
dense, which may obscure small masses.
FINDINGS: Deep to the palpable marker in the superior left breast, there is an
irregular mass with spiculated margins and associated calcifications
at the 12 to 1 o'clock position. Skin thickening is noted in the
lower-inner quadrant of the left breast. A small asymmetry was seen
in the lateral aspect of the left breast on the initial CC view,
which resolves on spot compression tomosynthesis imaging. No
suspicious calcifications, masses or areas of distortion are seen in
the right breast.

Physical exam demonstrates a firm palpable lump in the superior left
breast. No rashes or erythema is noted in the lower-inner quadrant
of the left breast in the region of skin thickening.

Ultrasound targeted to the left breast at 12 o'clock, 6 cm from the
nipple demonstrates an irregular hypoechoic mass with surrounding
echogenicity measuring 2.7 x 2.6 x 2.2 cm. Ultrasound of the left
axilla demonstrates 2 lymph nodes with mildly thickened cortices of
4 mm.
IMPRESSION: 1. There is a highly suspicious 2.7 cm mass in the left breast at 12
o'clock.

2. There are 2 lymph nodes with slightly thickened cortices of 4 mm
in the left axilla.

3. There is skin thickening in the lower-inner quadrant of the left
breast on mammography.

4.  No evidence of malignancy in the right breast.

RECOMMENDATION:
1. Ultrasound-guided biopsy is recommended for the left breast mass
at 12 o'clock.

2. Ultrasound-guided biopsy is recommended for 1 of the mildly
thickened lymph nodes in the left axilla.

3. If the ultrasound-guided biopsies demonstrate malignancy,
consider skin punch biopsy of the region of thickening in the
lower-inner quadrant of the left breast, if this would alter
clinical management.

Our schedulers contact the patient to schedule these appointments at
the patient's earliest convenience.

I have discussed the findings and recommendations with the patient.
If applicable, a reminder letter will be sent to the patient
regarding the next appointment.

BI-RADS CATEGORY  5: Highly suggestive of malignancy.

## 2020-11-14 ENCOUNTER — Other Ambulatory Visit: Payer: Self-pay | Admitting: Family Medicine

## 2020-11-14 DIAGNOSIS — N6320 Unspecified lump in the left breast, unspecified quadrant: Secondary | ICD-10-CM

## 2020-11-14 DIAGNOSIS — R928 Other abnormal and inconclusive findings on diagnostic imaging of breast: Secondary | ICD-10-CM

## 2020-11-26 ENCOUNTER — Ambulatory Visit
Admission: RE | Admit: 2020-11-26 | Discharge: 2020-11-26 | Disposition: A | Discharge status: Home / Self Care | Payer: 59 | Source: Ambulatory Visit | Attending: Family Medicine | Admitting: Family Medicine

## 2020-11-26 ENCOUNTER — Other Ambulatory Visit: Payer: Self-pay

## 2020-11-26 DIAGNOSIS — R928 Other abnormal and inconclusive findings on diagnostic imaging of breast: Secondary | ICD-10-CM | POA: Diagnosis not present

## 2020-11-26 DIAGNOSIS — N6320 Unspecified lump in the left breast, unspecified quadrant: Secondary | ICD-10-CM

## 2020-11-26 HISTORY — PX: BREAST BIOPSY: SHX20

## 2020-11-26 IMAGING — MG MM BREAST LOCALIZATION CLIP
6 series · 6 of 18 positions shown · non-contrast
Comparison: Previous exam(s).

CLINICAL DATA: Post biopsy mammogram of the left breast for clip
placement.

EXAM:
DIAGNOSTIC LEFT MAMMOGRAM POST ULTRASOUND BIOPSY

[L CC synth-2D]
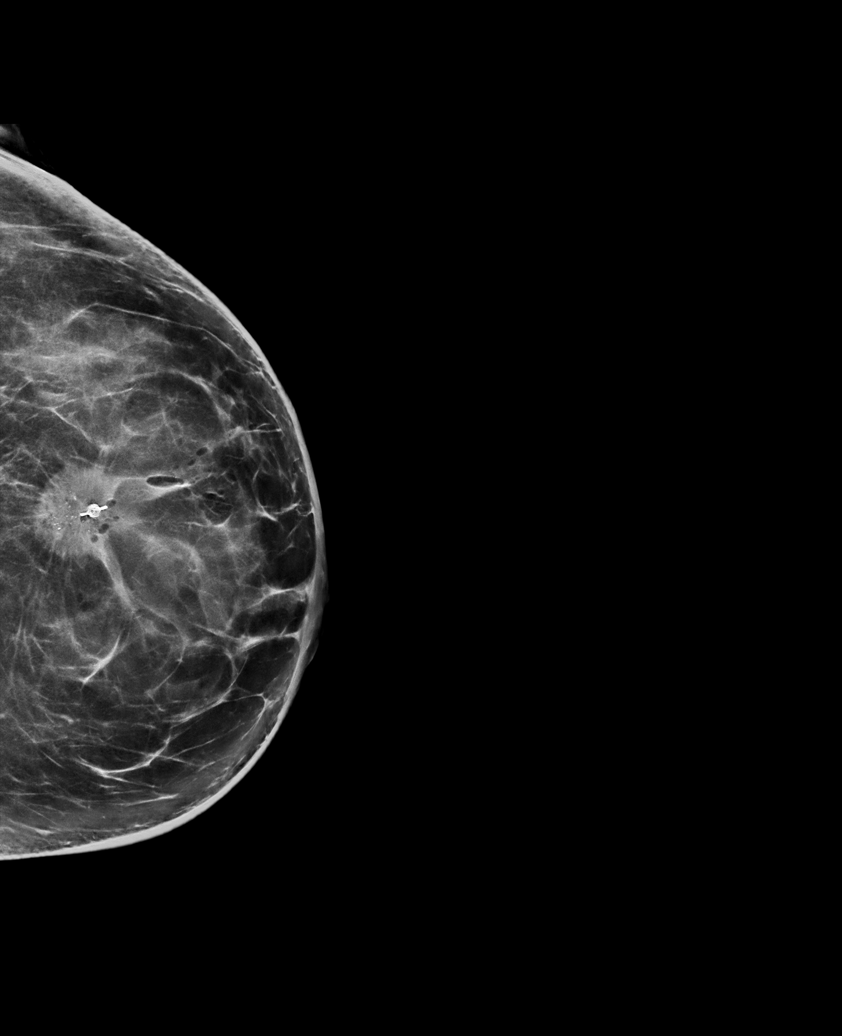

[L ML synth-2D]
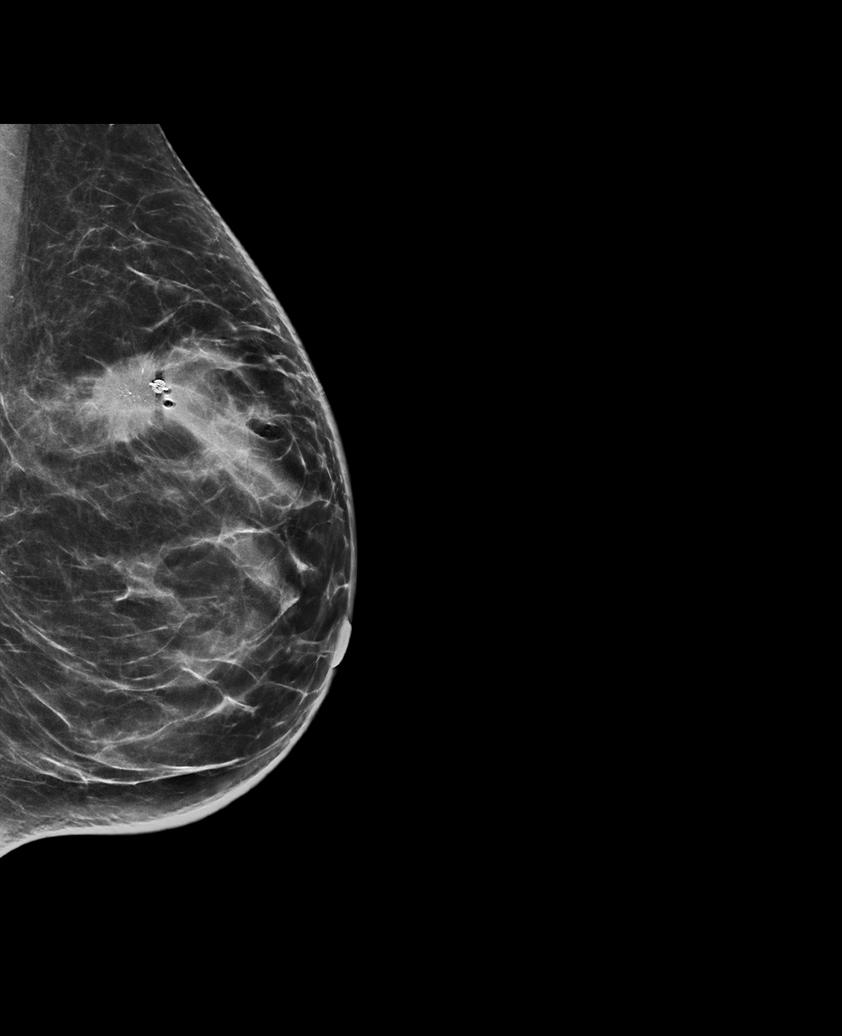

[L MLO synth-2D]
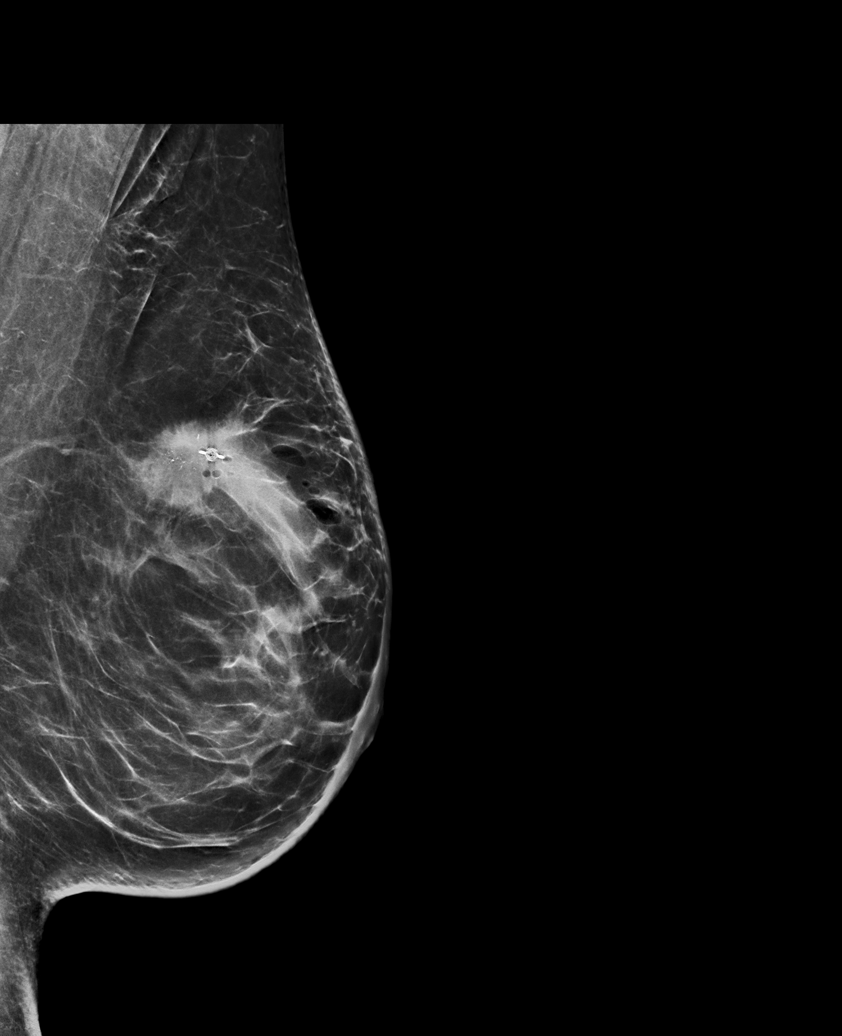

[L CC tomo · tomo slice 45/89.0]
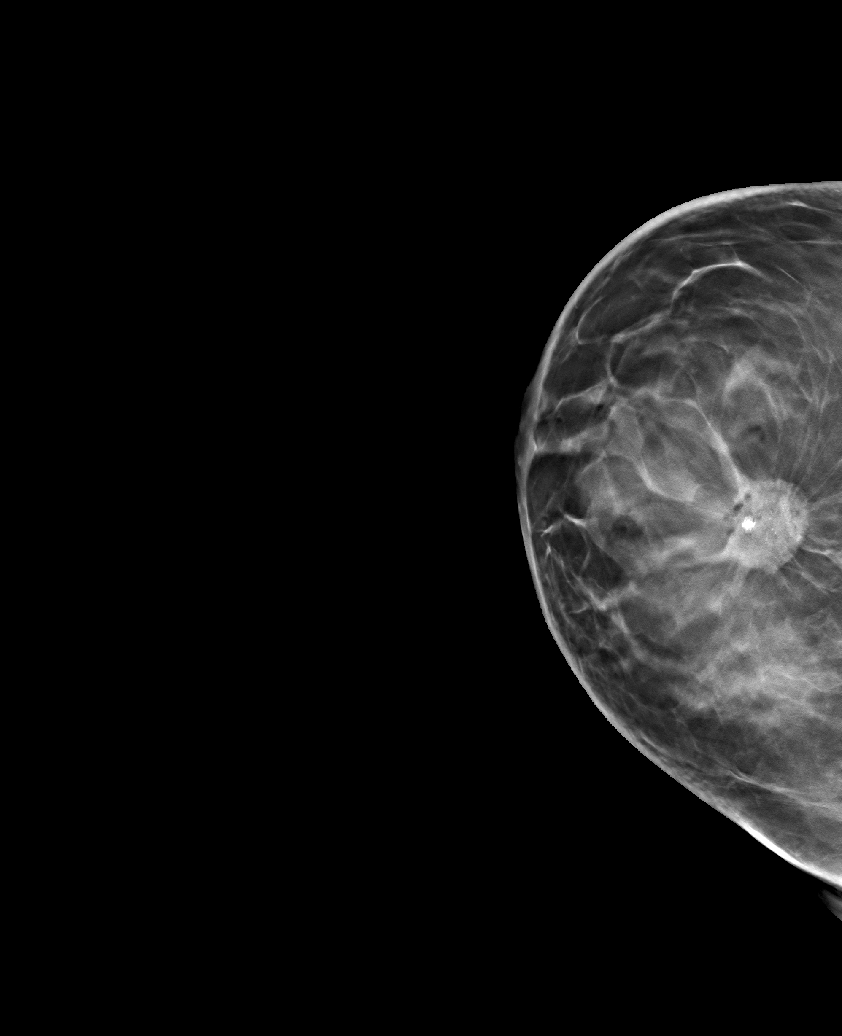

[L ML tomo · tomo slice 39/77.0]
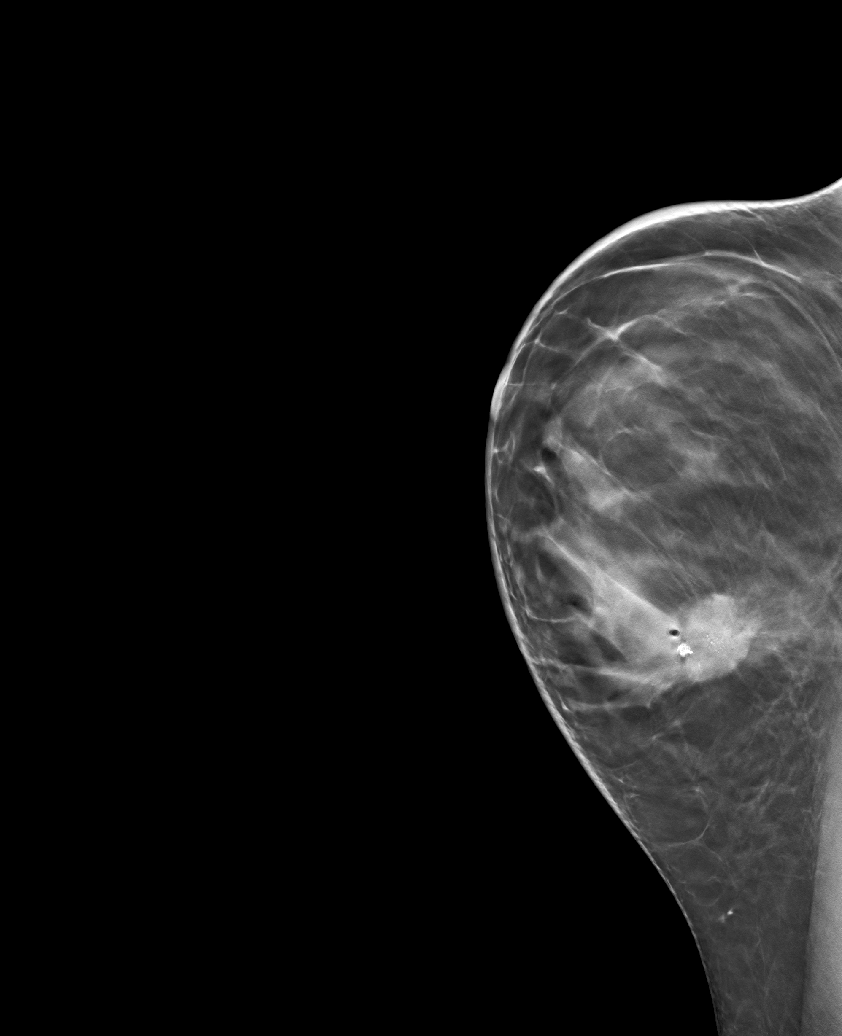

[L MLO tomo · tomo slice 42/83.0]
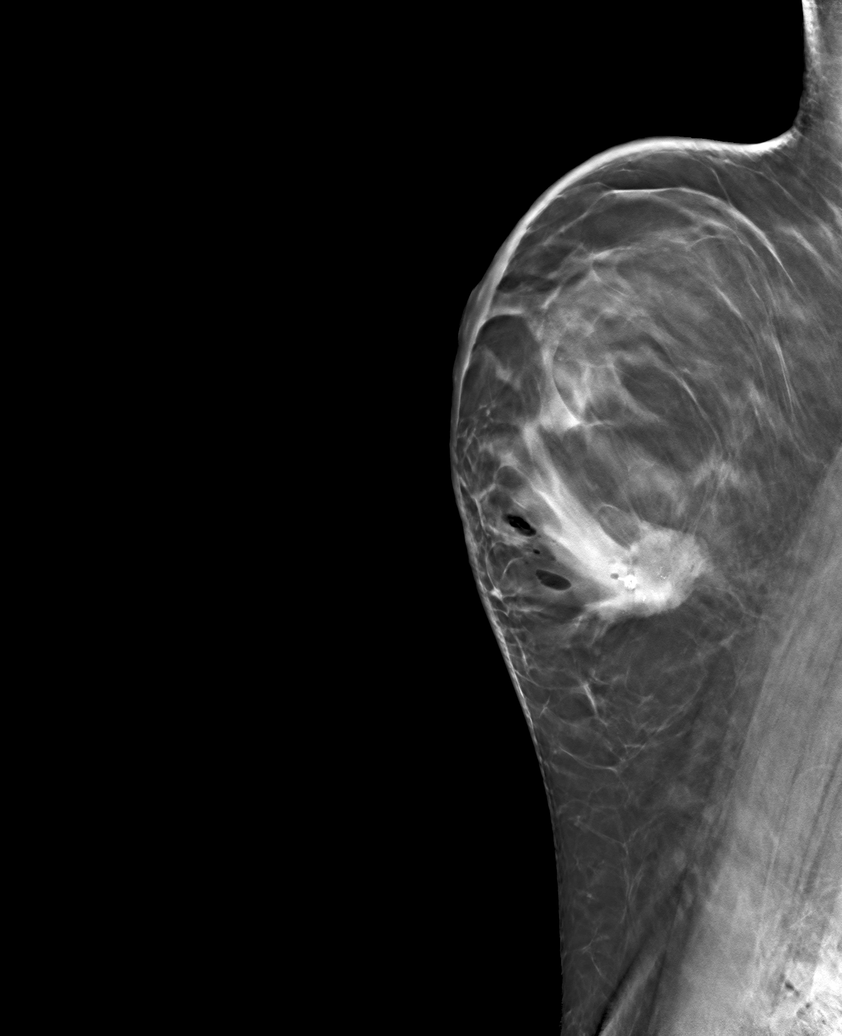

[6 of 18 positions shown; findings below may reference images not displayed]

FINDINGS: Mammographic images were obtained following ultrasound guided biopsy
of a left breast mass and a left axillary lymph node. The biopsy
marking clips are in expected position at the sites of biopsy.
IMPRESSION: 1. Appropriate positioning of the vision shaped biopsy marking clip
at the site of biopsy in the left breast mass at 12 o'clock.

2. Appropriate positioning of the HydroMARK clip at the site of the
lymph node in the left axilla.

Final Assessment: Post Procedure Mammograms for Marker Placement

## 2020-11-26 IMAGING — MG US BREAST BX W LOC DEV 1ST LESION IMG BX SPEC US GUIDE*L*
1 series · 8 of 8 positions shown · non-contrast
Comparison: Previous exam(s).
COMPARISON: Previous exam(s).

Addendum:
CLINICAL DATA: 29-year-old female presenting for ultrasound-guided
biopsy of a left breast mass and left axillary lymph node.

EXAM:
ULTRASOUND GUIDED LEFT BREAST CORE NEEDLE BIOPSY

[Series 1: MG view · 0.07mm/px · 8 of 31 slices shown]
[im 1/31]
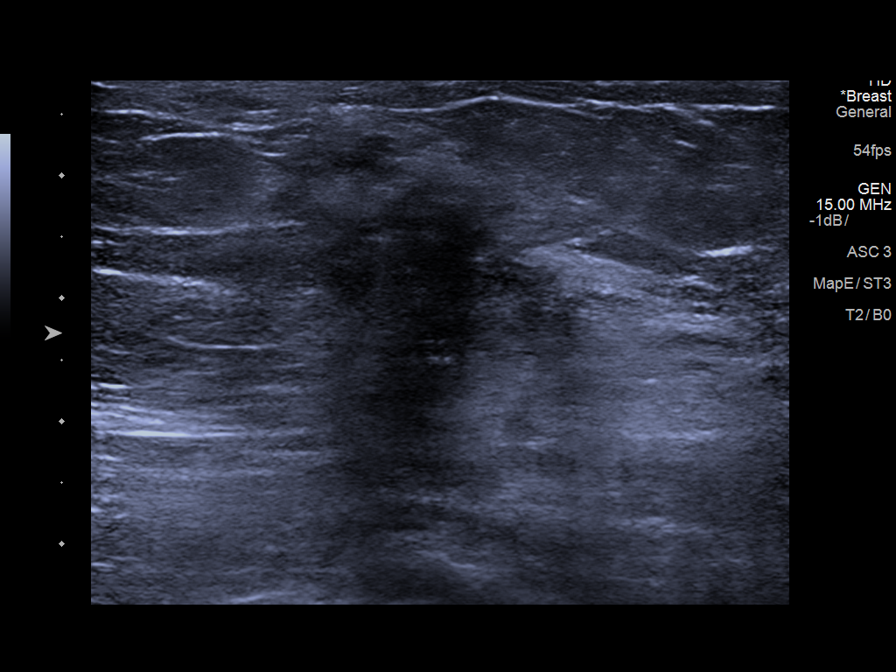
[im 5/31]
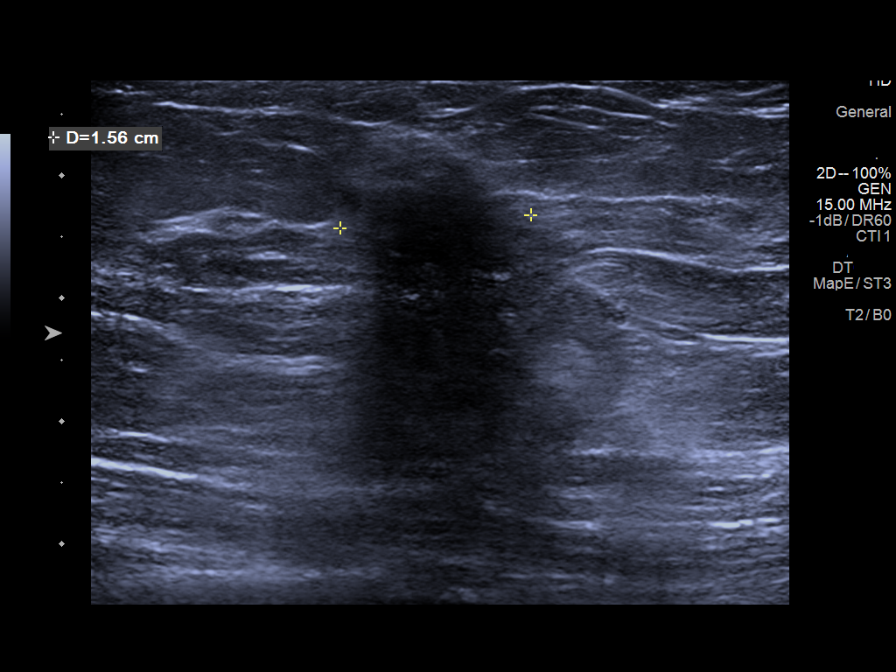
[im 9/31]
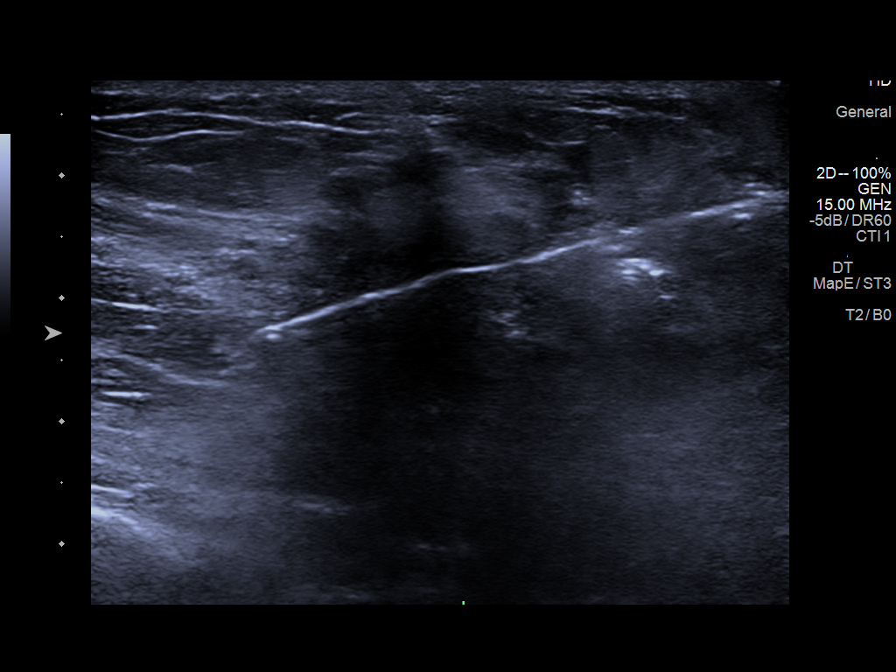
[im 13/31]
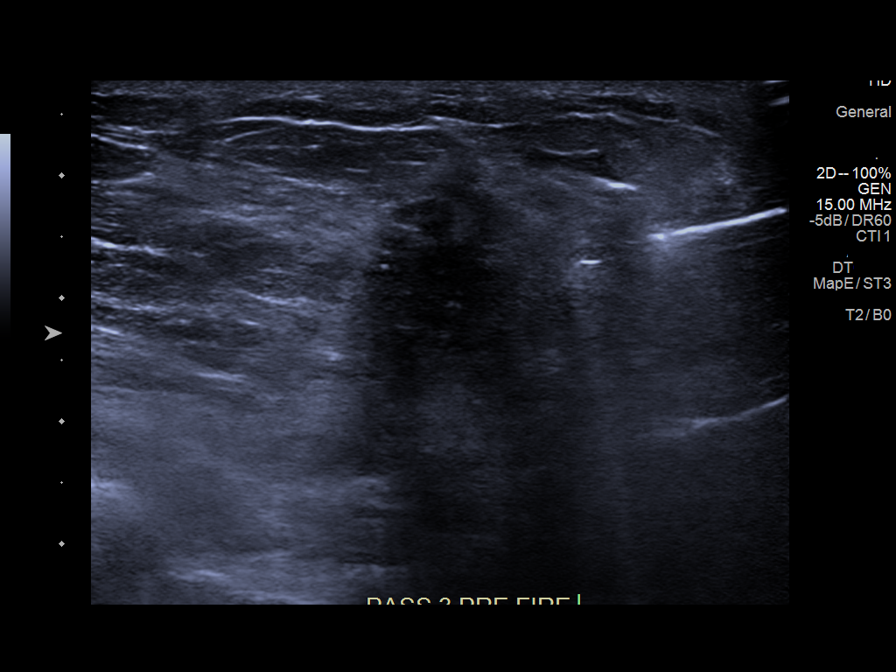
[im 18/31]
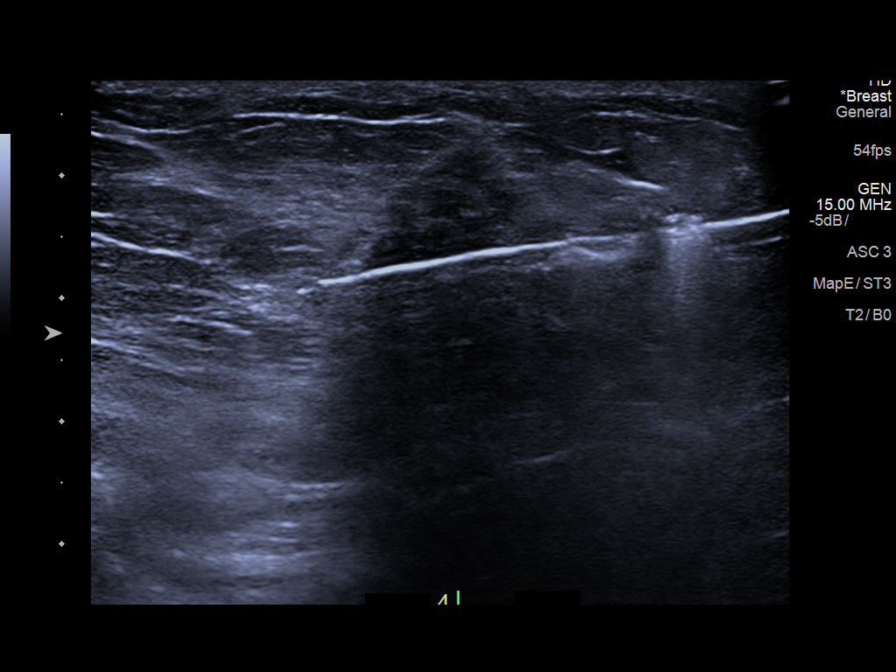
[im 22/31]
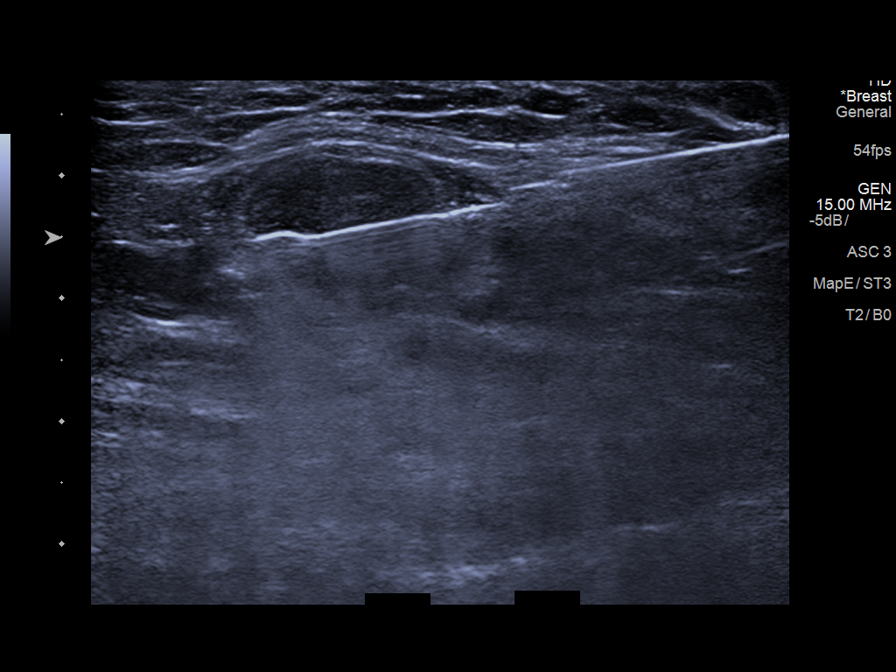
[im 26/31]
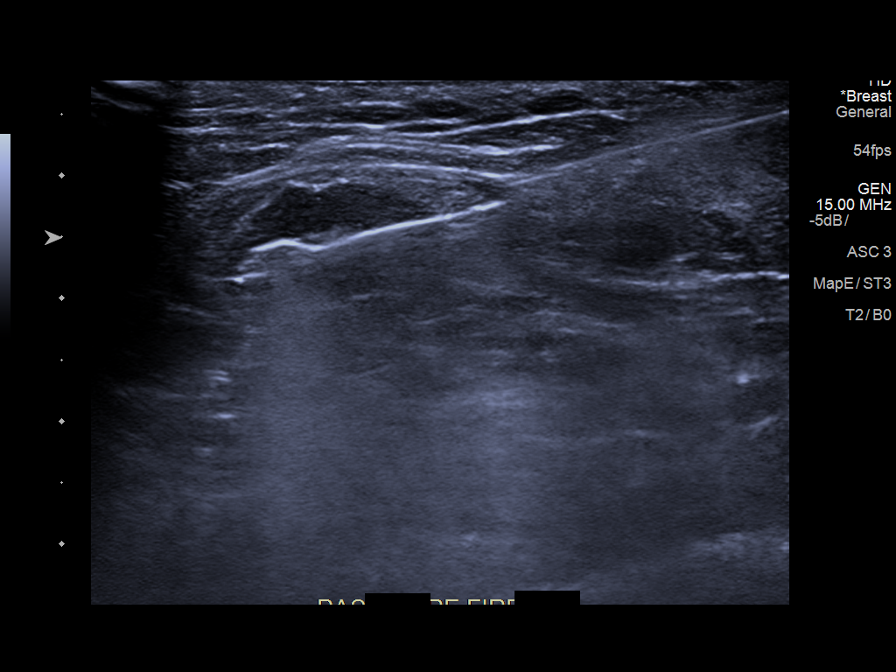
[im 31/31]
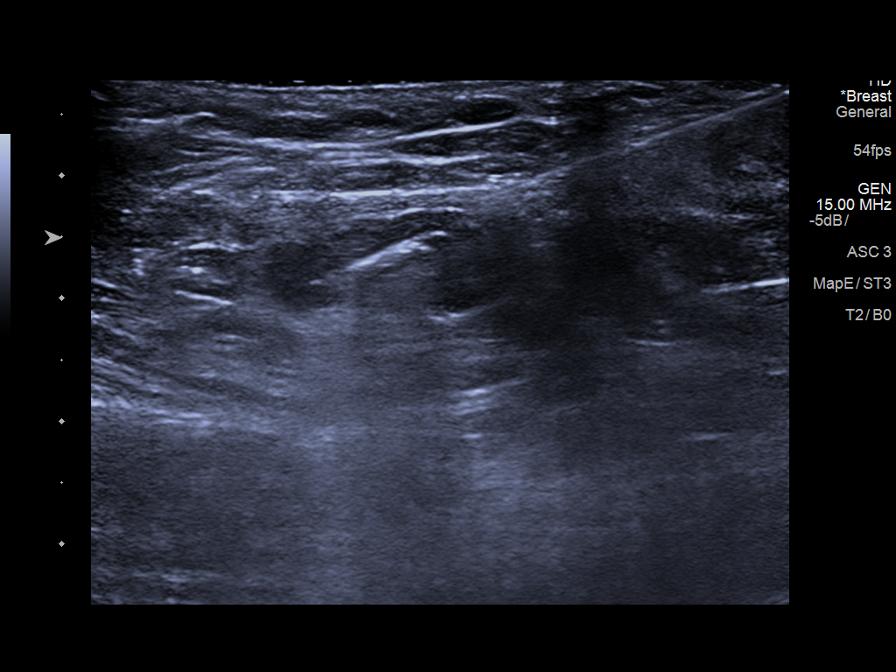

[8 of 8 positions shown; findings below may reference images not displayed]



Lesion quadrant: Upper outer quadrant

Using sterile technique and 1% Lidocaine as local anesthetic, under
direct ultrasound visualization, a 14 gauge IKEBUDU device was
used to perform biopsy of a mass in the left breast at 12 o'clock
using a lateral approach. At the conclusion of the procedure a
vision tissue marker clip was deployed into the biopsy cavity.

--------------------------------------------------

Lesion quadrant: Left axilla

Using sterile technique and 1% Lidocaine as local anesthetic, under
direct ultrasound visualization, a 14 gauge IKEBUDU device was
used to perform biopsy of a left axillary lymph node using an
inferior approach. At the conclusion of the procedure a HydroMARK
tissue marker clip was deployed into the biopsy cavity.

Follow up 2 view mammogram was performed and dictated separately.
IMPRESSION: 1. Ultrasound guided biopsy of a left breast mass at 12 o'clock. No
apparent complications.

2. Ultrasound-guided biopsy of a left axillary lymph node. No
apparent complications.

ADDENDUM:
PATHOLOGY revealed: Site A. BREAST, LEFT AT [DATE], 6 CM FROM THE
NIPPLE; ULTRASOUND-GUIDED CORE NEEDLE BIOPSY: - INVASIVE MAMMARY
CARCINOMA, NO SPECIAL TYPE. Size of invasive carcinoma: At least 13
mm in this sample. Grade 3. Ductal carcinoma in situ: Not
identified. Lymphovascular invasion: Not identified.

Pathology results are CONCORDANT with imaging findings, per Dr.
IKEBUDU.

PATHOLOGY revealed: Site B. LYMPH NODE, LEFT AXILLARY;
ULTRASOUND-GUIDED CORE NEEDLE BIOPSY: - FRAGMENTS OF LYMPH NODE
INVOLVED BY MACRO METASTATIC CARCINOMA, 10 MM IN GREATEST EXTENT,
SUSPICIOUS FOR EXTRACAPSULAR EXTENSION.

Pathology results are CONCORDANT with imaging findings, per Dr.
IKEBUDU.

Pathology results and recommendations below were discussed with
patient by telephone on [DATE]. Patient reported biopsy site
within normal limits with slight tenderness at the site. Post biopsy
care instructions were reviewed, questions were answered and my
direct phone number was provided to patient. Patient was instructed
to call [HOSPITAL] if any concerns or questions arise
related to the biopsy.

Recommendations:

1. Surgical and oncological consultation: Request for surgical and
oncological consultation relayed to IKEBUDU RN and IKEBUDU
IKEBUDU RN at [HOSPITAL] [HOSPITAL] by IKEBUDU RN on
[DATE].

2. Consider doing skin punch biopsy of region of thickening in the
lower-inner quadrant of LEFT breast, if this would alter clinical
management. IKEBUDU RN stated patient has upcoming appointment
with medical oncologist (Dr. IKEBUDU) who will address the need
for skin punch biopsy.

Pathology results reported by IKEBUDU RN on [DATE].



Lesion quadrant: Upper outer quadrant

Using sterile technique and 1% Lidocaine as local anesthetic, under
direct ultrasound visualization, a 14 gauge IKEBUDU device was
used to perform biopsy of a mass in the left breast at 12 o'clock
using a lateral approach. At the conclusion of the procedure a
vision tissue marker clip was deployed into the biopsy cavity.

--------------------------------------------------

Lesion quadrant: Left axilla

Using sterile technique and 1% Lidocaine as local anesthetic, under
direct ultrasound visualization, a 14 gauge IKEBUDU device was
used to perform biopsy of a left axillary lymph node using an
inferior approach. At the conclusion of the procedure a HydroMARK
tissue marker clip was deployed into the biopsy cavity.

Follow up 2 view mammogram was performed and dictated separately.
IMPRESSION: 1. Ultrasound guided biopsy of a left breast mass at 12 o'clock. No
apparent complications.

2. Ultrasound-guided biopsy of a left axillary lymph node. No
apparent complications.

## 2020-11-28 DIAGNOSIS — C50919 Malignant neoplasm of unspecified site of unspecified female breast: Secondary | ICD-10-CM

## 2020-11-28 NOTE — Progress Notes (Signed)
Patient ID: Vickie Mathis, female   DOB: [birth year], 30 y.o.   MRN: [id number] Navigation initiated.  Scheduled Oncology, and surgical consults.   Will accompany to initial Med/Onc appointment with Dr. Janese Banks 12/03/20.

## 2020-12-03 ENCOUNTER — Inpatient Hospital Stay: Payer: 59 | Attending: Oncology | Admitting: Oncology

## 2020-12-03 ENCOUNTER — Encounter: Payer: Self-pay | Admitting: Oncology

## 2020-12-03 ENCOUNTER — Inpatient Hospital Stay: Payer: 59

## 2020-12-03 ENCOUNTER — Other Ambulatory Visit: Payer: Self-pay | Admitting: *Deleted

## 2020-12-03 VITALS — BP 118/65 | HR 74 | Temp 98.6°F | Ht 69.0 in | Wt 192.9 lb

## 2020-12-03 DIAGNOSIS — Z5111 Encounter for antineoplastic chemotherapy: Secondary | ICD-10-CM | POA: Insufficient documentation

## 2020-12-03 DIAGNOSIS — Z7189 Other specified counseling: Secondary | ICD-10-CM

## 2020-12-03 DIAGNOSIS — C50812 Malignant neoplasm of overlapping sites of left female breast: Secondary | ICD-10-CM | POA: Diagnosis not present

## 2020-12-03 DIAGNOSIS — Z5112 Encounter for antineoplastic immunotherapy: Secondary | ICD-10-CM | POA: Diagnosis present

## 2020-12-03 DIAGNOSIS — Z79899 Other long term (current) drug therapy: Secondary | ICD-10-CM | POA: Insufficient documentation

## 2020-12-03 DIAGNOSIS — C773 Secondary and unspecified malignant neoplasm of axilla and upper limb lymph nodes: Secondary | ICD-10-CM | POA: Diagnosis not present

## 2020-12-03 DIAGNOSIS — C50919 Malignant neoplasm of unspecified site of unspecified female breast: Secondary | ICD-10-CM

## 2020-12-03 MED ORDER — CLONAZEPAM 0.5 MG PO TABS: 0.5000 mg | ORAL_TABLET | Freq: Three times a day (TID) | ORAL | 0 refills | Status: DC | PRN

## 2020-12-03 NOTE — Addendum Note (Signed)
Addended by: Sindy Guadeloupe on: 12/03/2020 05:33 PM   Modules accepted: Orders

## 2020-12-03 NOTE — Progress Notes (Addendum)
Hematology/Oncology Consult note [facility] Telephone:([phone] [phone]  Patient Care Team: Juluis Pitch, MD as PCP - General (Family Medicine)   Name of the patient: Vickie Mathis  [id number]  01-23-1991    Reason for referral-new diagnosis of breast cancer   Referring physician-Dr. Edman Circle  Date of visit: 12/03/20   History of presenting illness-patient is a 30 year old female who self palpated a left breast mass about 10 months ago and since then it has been slowly growing.  She sought medical attention recently after thinking it was a possible cyst for all this while.She underwent a diagnostic bilateral mammogram and ultrasound which showed a 2.7 cm mass at the 12 o'clock position 6 cm from the nipple.  Ultrasound of the left axilla demonstrates 2 lymph nodes with mild thickened cortices of 4 mm.  There is skin thickening on the lower inner quadrant of the left breast on mammography.  Both the breast mass and the lymph node were biopsied and was positive for invasive mammary carcinoma grade 3.  Lymph node was also suspicious for extracapsular extension.  ER/PR and HER-2 testing are formally pending but as per my conversation with Dr. Ronnald Ramp who reviewed her virtual slides this appears to be a triple negative breast cancer  Patient is married and here with her husband.  She currently uses birth control pills.  She has never had any prior mammograms or abnormal breast biopsies.  No significant family history of colon cancer, ovarian cancer breast or pancreatic cancer or melanoma's.  She is G0 P0 L0  Patient reports feeling anxious and has some soreness at the site of the left breast mass.  Patient reports pain behind her right knee.  Denies other complaints at this time.  She works for the [employer] PS- 0  Pain scale- 2   Review of systems- Review of Systems  Constitutional: Negative for chills, fever, malaise/fatigue and weight  loss.  HENT: Negative for congestion, ear discharge and nosebleeds.   Eyes: Negative for blurred vision.  Respiratory: Negative for cough, hemoptysis, sputum production, shortness of breath and wheezing.   Cardiovascular: Negative for chest pain, palpitations, orthopnea and claudication.  Gastrointestinal: Negative for abdominal pain, blood in stool, constipation, diarrhea, heartburn, melena, nausea and vomiting.  Genitourinary: Negative for dysuria, flank pain, frequency, hematuria and urgency.  Musculoskeletal: Negative for back pain, joint pain and myalgias.  Skin: Negative for rash.  Neurological: Negative for dizziness, tingling, focal weakness, seizures, weakness and headaches.  Endo/Heme/Allergies: Does not bruise/bleed easily.  Psychiatric/Behavioral: Negative for depression and suicidal ideas. The patient is nervous/anxious. The patient does not have insomnia.     Allergies  Allergen Reactions  . Amoxicillin     Other reaction(s): "too young to remember what they do to me"  . Sulfa Antibiotics     Other reaction(s): "too young to remember what they do to me"    Patient Active Problem List   Diagnosis Date Noted  . Breast mass, left 07/01/2020  . Situational mixed anxiety and depressive disorder 07/01/2020  . Anxiety state 08/06/2014  . Varicose veins of lower extremity 08/06/2014     History reviewed. No pertinent past medical history.   Past Surgical History:  Procedure Laterality Date  . BREAST BIOPSY Left 11/26/2020   vision 12:00 6cmfn path pending  . BREAST BIOPSY Left 11/26/2020   LN t3 marler path pending    Social History   Socioeconomic History  . Marital status: Married  Spouse name: Not on file  . Number of children: Not on file  . Years of education: Not on file  . Highest education level: Not on file  Occupational History  . Not on file  Tobacco Use  . Smoking status: Not on file  . Smokeless tobacco: Not on file  Substance and Sexual  Activity  . Alcohol use: Not on file  . Drug use: Not on file  . Sexual activity: Not on file  Other Topics Concern  . Not on file  Social History Narrative  . Not on file   Social Determinants of Health   Financial Resource Strain: Not on file  Food Insecurity: Not on file  Transportation Needs: Not on file  Physical Activity: Not on file  Stress: Not on file  Social Connections: Not on file  Intimate Partner Violence: Not on file     History reviewed. No pertinent family history.   Current Outpatient Medications:  .  acetaminophen (TYLENOL) 325 MG tablet, , Disp: , Rfl:  .  albuterol (VENTOLIN HFA) 108 (90 Base) MCG/ACT inhaler, Inhale into the lungs., Disp: , Rfl:  .  busPIRone (BUSPAR) 15 MG tablet, Take 15 mg by mouth 2 (two) times daily., Disp: , Rfl:  .  citalopram (CELEXA) 40 MG tablet, Take 40 mg by mouth daily., Disp: , Rfl:  .  clonazePAM (KLONOPIN) 0.5 MG tablet, Take 0.5 mg by mouth 3 (three) times daily as needed., Disp: , Rfl:  .  ibuprofen (ADVIL) 200 MG tablet, , Disp: , Rfl:  .  norethindrone-ethinyl estradiol (LOESTRIN FE) 1-20 MG-MCG tablet, Take 1 tablet by mouth daily., Disp: , Rfl:    Physical exam:  Vitals:   12/03/20 1531  BP: 118/65  Pulse: 74  Temp: 98.6 F (37 C)  TempSrc: Tympanic  SpO2: 100%  Weight: 192 lb 14.4 oz (87.5 kg)   Physical Exam Constitutional:      General: She is not in acute distress. Eyes:     Extraocular Movements: EOM normal.  Cardiovascular:     Rate and Rhythm: Normal rate and regular rhythm.     Heart sounds: Normal heart sounds.  Pulmonary:     Effort: Pulmonary effort is normal.     Breath sounds: Normal breath sounds.  Abdominal:     General: Bowel sounds are normal.     Palpations: Abdomen is soft.  Musculoskeletal:     Comments: Possible Baker's cyst in the popliteal fossa of the right knee  Skin:    General: Skin is warm and dry.  Neurological:     Mental Status: She is alert and oriented to  person, place, and time.   Breast exam: There is an ill-defined roughly 2 cm left breast mass at the 12 o'clock position 6 cm from the nipple.  1 solitary axillary lymph node is also palpable in the left axilla.  No palpable masses in the right breast.  No palpable right axillary adenopathy.   No flowsheet data found. No flowsheet data found.  No images are attached to the encounter.  US BREAST LTD UNI LEFT INC AXILLA  Result Date: 11/12/2020 CLINICAL DATA:  30 year old female presenting for evaluation of a palpable lump in the left breast. EXAM: DIGITAL DIAGNOSTIC BILATERAL MAMMOGRAM WITH TOMOSYNTHESIS AND CAD; ULTRASOUND LEFT BREAST LIMITED TECHNIQUE: Bilateral digital diagnostic mammography and breast tomosynthesis was performed. The images were evaluated with computer-aided detection.; Targeted ultrasound examination of the left breast was performed. COMPARISON:  None. ACR Breast Density Category  c: The breast tissue is heterogeneously dense, which may obscure small masses. FINDINGS: Deep to the palpable marker in the superior left breast, there is an irregular mass with spiculated margins and associated calcifications at the 12 to 1 o'clock position. Skin thickening is noted in the lower-inner quadrant of the left breast. A small asymmetry was seen in the lateral aspect of the left breast on the initial CC view, which resolves on spot compression tomosynthesis imaging. No suspicious calcifications, masses or areas of distortion are seen in the right breast. Physical exam demonstrates a firm palpable lump in the superior left breast. No rashes or erythema is noted in the lower-inner quadrant of the left breast in the region of skin thickening. Ultrasound targeted to the left breast at 12 o'clock, 6 cm from the nipple demonstrates an irregular hypoechoic mass with surrounding echogenicity measuring 2.7 x 2.6 x 2.2 cm. Ultrasound of the left axilla demonstrates 2 lymph nodes with mildly thickened  cortices of 4 mm. IMPRESSION: 1. There is a highly suspicious 2.7 cm mass in the left breast at 12 o'clock. 2. There are 2 lymph nodes with slightly thickened cortices of 4 mm in the left axilla. 3. There is skin thickening in the lower-inner quadrant of the left breast on mammography. 4.  No evidence of malignancy in the right breast. RECOMMENDATION: 1. Ultrasound-guided biopsy is recommended for the left breast mass at 12 o'clock. 2. Ultrasound-guided biopsy is recommended for 1 of the mildly thickened lymph nodes in the left axilla. 3. If the ultrasound-guided biopsies demonstrate malignancy, consider skin punch biopsy of the region of thickening in the lower-inner quadrant of the left breast, if this would alter clinical management. Our schedulers contact the patient to schedule these appointments at the patient's earliest convenience. I have discussed the findings and recommendations with the patient. If applicable, a reminder letter will be sent to the patient regarding the next appointment. BI-RADS CATEGORY  5: Highly suggestive of malignancy. Electronically Signed   By: Ammie Ferrier M.D.   On: 11/12/2020 16:44   MM DIAG BREAST TOMO BILATERAL  Result Date: 11/12/2020 CLINICAL DATA:  30 year old female presenting for evaluation of a palpable lump in the left breast. EXAM: DIGITAL DIAGNOSTIC BILATERAL MAMMOGRAM WITH TOMOSYNTHESIS AND CAD; ULTRASOUND LEFT BREAST LIMITED TECHNIQUE: Bilateral digital diagnostic mammography and breast tomosynthesis was performed. The images were evaluated with computer-aided detection.; Targeted ultrasound examination of the left breast was performed. COMPARISON:  None. ACR Breast Density Category c: The breast tissue is heterogeneously dense, which may obscure small masses. FINDINGS: Deep to the palpable marker in the superior left breast, there is an irregular mass with spiculated margins and associated calcifications at the 12 to 1 o'clock position. Skin thickening is  noted in the lower-inner quadrant of the left breast. A small asymmetry was seen in the lateral aspect of the left breast on the initial CC view, which resolves on spot compression tomosynthesis imaging. No suspicious calcifications, masses or areas of distortion are seen in the right breast. Physical exam demonstrates a firm palpable lump in the superior left breast. No rashes or erythema is noted in the lower-inner quadrant of the left breast in the region of skin thickening. Ultrasound targeted to the left breast at 12 o'clock, 6 cm from the nipple demonstrates an irregular hypoechoic mass with surrounding echogenicity measuring 2.7 x 2.6 x 2.2 cm. Ultrasound of the left axilla demonstrates 2 lymph nodes with mildly thickened cortices of 4 mm. IMPRESSION: 1. There is a highly  suspicious 2.7 cm mass in the left breast at 12 o'clock. 2. There are 2 lymph nodes with slightly thickened cortices of 4 mm in the left axilla. 3. There is skin thickening in the lower-inner quadrant of the left breast on mammography. 4.  No evidence of malignancy in the right breast. RECOMMENDATION: 1. Ultrasound-guided biopsy is recommended for the left breast mass at 12 o'clock. 2. Ultrasound-guided biopsy is recommended for 1 of the mildly thickened lymph nodes in the left axilla. 3. If the ultrasound-guided biopsies demonstrate malignancy, consider skin punch biopsy of the region of thickening in the lower-inner quadrant of the left breast, if this would alter clinical management. Our schedulers contact the patient to schedule these appointments at the patient's earliest convenience. I have discussed the findings and recommendations with the patient. If applicable, a reminder letter will be sent to the patient regarding the next appointment. BI-RADS CATEGORY  5: Highly suggestive of malignancy. Electronically Signed   By: Ammie Ferrier M.D.   On: 11/12/2020 16:44   MM CLIP PLACEMENT LEFT  Result Date: 11/26/2020 CLINICAL DATA:   Post biopsy mammogram of the left breast for clip placement. EXAM: DIAGNOSTIC LEFT MAMMOGRAM POST ULTRASOUND BIOPSY COMPARISON:  Previous exam(s). FINDINGS: Mammographic images were obtained following ultrasound guided biopsy of a left breast mass and a left axillary lymph node. The biopsy marking clips are in expected position at the sites of biopsy. IMPRESSION: 1. Appropriate positioning of the vision shaped biopsy marking clip at the site of biopsy in the left breast mass at 12 o'clock. 2. Appropriate positioning of the HydroMARK clip at the site of the lymph node in the left axilla. Final Assessment: Post Procedure Mammograms for Marker Placement Electronically Signed   By: Ammie Ferrier M.D.   On: 11/26/2020 09:19   [place] LT BREAST BX W LOC DEV 1ST LESION IMG BX SPEC US GUIDE  Addendum Date: 12/02/2020   ADDENDUM REPORT: 11/29/2020 15:14 ADDENDUM: PATHOLOGY revealed: Site A. BREAST, LEFT AT 12:00, 6 CM FROM THE NIPPLE; ULTRASOUND-GUIDED CORE NEEDLE BIOPSY: - INVASIVE MAMMARY CARCINOMA, NO SPECIAL TYPE. Size of invasive carcinoma: At least 13 mm in this sample. Grade 3. Ductal carcinoma in situ: Not identified. Lymphovascular invasion: Not identified. Pathology results are CONCORDANT with imaging findings, per Dr. Ammie Ferrier. PATHOLOGY revealed: Site B. LYMPH NODE, LEFT AXILLARY; ULTRASOUND-GUIDED CORE NEEDLE BIOPSY: - FRAGMENTS OF LYMPH NODE INVOLVED BY MACRO METASTATIC CARCINOMA, 10 MM IN GREATEST EXTENT, SUSPICIOUS FOR EXTRACAPSULAR EXTENSION. Pathology results are CONCORDANT with imaging findings, per Dr. Ammie Ferrier. Pathology results and recommendations below were discussed with patient by telephone on 11/28/2020. Patient reported biopsy site within normal limits with slight tenderness at the site. Post biopsy care instructions were reviewed, questions were answered and my direct phone number was provided to patient. Patient was instructed to call [facility] if any concerns or  questions arise related to the biopsy. Recommendations: 1. Surgical and oncological consultation: Request for surgical and oncological consultation relayed to Al Pimple RN and Tanya Nones RN at [facility] by Electa Sniff RN on 11/27/2020. 2. Consider doing skin punch biopsy of region of thickening in the lower-inner quadrant of LEFT breast, if this would alter clinical management. Al Pimple RN stated patient has upcoming appointment with medical oncologist (Dr. Randa Evens) who will address the need for skin punch biopsy. Pathology results reported by Electa Sniff RN on 11/29/2020. Electronically Signed   By: Ammie Ferrier M.D.   On: 11/29/2020 15:14  Result Date: 12/02/2020 CLINICAL DATA:  30 year old female presenting for ultrasound-guided biopsy of a left breast mass and left axillary lymph node. EXAM: ULTRASOUND GUIDED LEFT BREAST CORE NEEDLE BIOPSY COMPARISON:  Previous exam(s). PROCEDURE: I met with the patient and we discussed the procedure of ultrasound-guided biopsy, including benefits and alternatives. We discussed the high likelihood of a successful procedure. We discussed the risks of the procedure, including infection, bleeding, tissue injury, clip migration, and inadequate sampling. Informed written consent was given. The usual time-out protocol was performed immediately prior to the procedure. Lesion quadrant: Upper outer quadrant Using sterile technique and 1% Lidocaine as local anesthetic, under direct ultrasound visualization, a 14 gauge spring-loaded device was used to perform biopsy of a mass in the left breast at 12 o'clock using a lateral approach. At the conclusion of the procedure a vision tissue marker clip was deployed into the biopsy cavity. -------------------------------------------------- Lesion quadrant: Left axilla Using sterile technique and 1% Lidocaine as local anesthetic, under direct ultrasound visualization, a 14 gauge spring-loaded device was used  to perform biopsy of a left axillary lymph node using an inferior approach. At the conclusion of the procedure a HydroMARK tissue marker clip was deployed into the biopsy cavity. Follow up 2 view mammogram was performed and dictated separately. IMPRESSION: 1. Ultrasound guided biopsy of a left breast mass at 12 o'clock. No apparent complications. 2. Ultrasound-guided biopsy of a left axillary lymph node. No apparent complications. Electronically Signed: By: Ammie Ferrier M.D. On: 11/26/2020 09:13   [place] LT BREAST BX W LOC DEV EA ADD LESION IMG BX SPEC US GUIDE  Addendum Date: 12/02/2020   ADDENDUM REPORT: 11/29/2020 15:14 ADDENDUM: PATHOLOGY revealed: Site A. BREAST, LEFT AT 12:00, 6 CM FROM THE NIPPLE; ULTRASOUND-GUIDED CORE NEEDLE BIOPSY: - INVASIVE MAMMARY CARCINOMA, NO SPECIAL TYPE. Size of invasive carcinoma: At least 13 mm in this sample. Grade 3. Ductal carcinoma in situ: Not identified. Lymphovascular invasion: Not identified. Pathology results are CONCORDANT with imaging findings, per Dr. Ammie Ferrier. PATHOLOGY revealed: Site B. LYMPH NODE, LEFT AXILLARY; ULTRASOUND-GUIDED CORE NEEDLE BIOPSY: - FRAGMENTS OF LYMPH NODE INVOLVED BY MACRO METASTATIC CARCINOMA, 10 MM IN GREATEST EXTENT, SUSPICIOUS FOR EXTRACAPSULAR EXTENSION. Pathology results are CONCORDANT with imaging findings, per Dr. Ammie Ferrier. Pathology results and recommendations below were discussed with patient by telephone on 11/28/2020. Patient reported biopsy site within normal limits with slight tenderness at the site. Post biopsy care instructions were reviewed, questions were answered and my direct phone number was provided to patient. Patient was instructed to call [facility] if any concerns or questions arise related to the biopsy. Recommendations: 1. Surgical and oncological consultation: Request for surgical and oncological consultation relayed to Al Pimple RN and Tanya Nones RN at [facility] by Electa Sniff RN on 11/27/2020. 2. Consider doing skin punch biopsy of region of thickening in the lower-inner quadrant of LEFT breast, if this would alter clinical management. Al Pimple RN stated patient has upcoming appointment with medical oncologist (Dr. Randa Evens) who will address the need for skin punch biopsy. Pathology results reported by Electa Sniff RN on 11/29/2020. Electronically Signed   By: Ammie Ferrier M.D.   On: 11/29/2020 15:14   Result Date: 12/02/2020 CLINICAL DATA:  30 year old female presenting for ultrasound-guided biopsy of a left breast mass and left axillary lymph node. EXAM: ULTRASOUND GUIDED LEFT BREAST CORE NEEDLE BIOPSY COMPARISON:  Previous exam(s). PROCEDURE: I met with the patient and we discussed the procedure of ultrasound-guided biopsy, including  benefits and alternatives. We discussed the high likelihood of a successful procedure. We discussed the risks of the procedure, including infection, bleeding, tissue injury, clip migration, and inadequate sampling. Informed written consent was given. The usual time-out protocol was performed immediately prior to the procedure. Lesion quadrant: Upper outer quadrant Using sterile technique and 1% Lidocaine as local anesthetic, under direct ultrasound visualization, a 14 gauge spring-loaded device was used to perform biopsy of a mass in the left breast at 12 o'clock using a lateral approach. At the conclusion of the procedure a vision tissue marker clip was deployed into the biopsy cavity. -------------------------------------------------- Lesion quadrant: Left axilla Using sterile technique and 1% Lidocaine as local anesthetic, under direct ultrasound visualization, a 14 gauge spring-loaded device was used to perform biopsy of a left axillary lymph node using an inferior approach. At the conclusion of the procedure a HydroMARK tissue marker clip was deployed into the biopsy cavity. Follow up 2 view mammogram was performed and  dictated separately. IMPRESSION: 1. Ultrasound guided biopsy of a left breast mass at 12 o'clock. No apparent complications. 2. Ultrasound-guided biopsy of a left axillary lymph node. No apparent complications. Electronically Signed: By: Ammie Ferrier M.D. On: 11/26/2020 09:13    Assessment and plan- Patient is a 30 y.o. female with newly diagnosed invasive mammary carcinoma of the left breast grade [id number] ER/PR and HER-2 pending but based on virtual slide review appears to be triple negative.  Discussed the findings of ultrasound and mammogram with the patient in detail which showed a 2.7 cm left breast mass with 2 suspicious lymph nodes.  Both the breast mass and the lymph nodes were biopsied and was consistent with grade 3 invasive mammary carcinoma.  ER/PR and HER-2 is currently pending but based on my discussion with Dr. Ronnald Ramp from pathology, he has reviewed the virtual slides and the tumor appears to be triple negative.  We await final slides to be back for official report.  We will proceed with a PET CT scan for complete staging work-up.  If PET is denied by insurance she will need a CT chest abdomen pelvis with contrast and a bone scan  Given that patient is young with lymph node positive disease -if she has no evidence of metastatic disease on PET scan she would benefit from neoadjuvant chemotherapy regardless.  If her tumor is triple negative I would recommend neoadjuvant carbotaxol chemotherapy to be given weekly along with [facility] which will be given IV every 3 weeks followed by [facility] chemotherapy and Keytruda every 3 weeks for 4 cycles per keynote 522 trial.  Discussed risks and benefits of carbotaxol chemotherapy including all but not limited to nausea, vomiting, low blood counts, risk of infections and hospitalizations.  Risk of hair loss and peripheral neuropathy associated with Taxol.  Discussed risks and benefits of Keytruda including all but not limited to autoimmune side effects  including autoimmune colitis thyroiditis pneumonitis and need to monitor kidney and liver functions.  There is no evidence of distant metastatic disease treatment will be given with a curative intent.  Treatment plan will change if there is evidence of metastatic disease on PET scan.  Discussed the possible risk of infertility associated with chemotherapy.  Discussed the need for reproductive counseling.  Patient would like to see the cost involved before she sees [facility] reproductive counseling and we will give her information about it.  Overall she is inclined to proceed with treatment without any further delays and she does not feel that she would  proceed with oocyte harvesting.  I will plan to give her Lupron for ovarian suppression during chemotherapy.  Given the young age of diagnosis of breast cancer, despite a negative family history I would refer her to genetic counseling.  Discussed that in terms of surgery it would depend on what MRI and genetic testing shows.  If she does not have BRCA1 or 2 mutation she may be able to get a lumpectomy but we will need to look at the skin thickening noted on mammogram in more detail with the MRI.  With regards to axillary lymph node management-it would be controversial if she can get sentinel lymph node biopsy with removal of the clip nodes versus axillary lymph node dissection given that she has a palpable left axillary node.  However she will need postmastectomy radiation as well and axillary lymph node dissection plus radiation would significantly increase the risk of lymphedema.  She will need to discuss all this in more detail with Dr. Peyton Najjar whom she has a meeting in 2 days.  He will also decide about getting a port placement soon.  Patient will also benefit from postmastectomy radiation.  If she has ER/PR negative tumor there would be no role for hormone therapy.  Final staging would be determined based on PET CT scan and MRI results.  Anxiety- will  prescribe prn ativan   Total face to face encounter time for this patient visit was 60 min with >50% time spent in counseling and coordination of care    Thank you for this kind referral and the opportunity to participate in the care of this patient   Visit Diagnosis 1. Invasive carcinoma of breast ([place])     Dr. Randa Evens, MD, MPH [facility] [phone] 12/03/2020 5:25 PM

## 2020-12-04 ENCOUNTER — Telehealth: Payer: Self-pay | Admitting: *Deleted

## 2020-12-04 ENCOUNTER — Encounter: Payer: Self-pay | Admitting: *Deleted

## 2020-12-04 ENCOUNTER — Other Ambulatory Visit: Payer: Self-pay | Admitting: Obstetrics and Gynecology

## 2020-12-04 LAB — SURGICAL PATHOLOGY

## 2020-12-04 NOTE — Progress Notes (Signed)
Supported patient and husband at initial Med/Onc.  Dr. Janese Banks thoroughly reviewed plan for treatment based on known pathology.  Patient understands upcoming imaging scheduled appointments.  Given Breast Cancer Treatment Handbook/folder.  Gas voucher given for travel to PET scan in [place].

## 2020-12-04 NOTE — Telephone Encounter (Signed)
Called the patient after speaking to [facility] reproduction medicine specialist. I spoke to [place] and based on her insurance they feel she is in network with them and so it will be based on her out of pocket and copay . No insurance would cost 320.00. Evelena Peat says that it would be much lower than [phone].also they have appt opening in [place] on this Friday 10:45. I called the pt. To let her know the above and she said that she feels like treatment comes first before saving eggs. She is thankful for the call and I can send her the info through my chart.

## 2020-12-05 ENCOUNTER — Ambulatory Visit: Payer: Self-pay | Admitting: General Surgery

## 2020-12-06 ENCOUNTER — Other Ambulatory Visit: Payer: Self-pay | Admitting: Anatomic Pathology & Clinical Pathology

## 2020-12-06 ENCOUNTER — Ambulatory Visit
Admission: RE | Admit: 2020-12-06 | Discharge: 2020-12-06 | Disposition: A | Discharge status: Home / Self Care | Payer: 59 | Source: Ambulatory Visit | Attending: Oncology | Admitting: Oncology

## 2020-12-06 ENCOUNTER — Ambulatory Visit: Payer: Self-pay | Admitting: General Surgery

## 2020-12-06 ENCOUNTER — Other Ambulatory Visit: Payer: Self-pay

## 2020-12-06 DIAGNOSIS — C50919 Malignant neoplasm of unspecified site of unspecified female breast: Secondary | ICD-10-CM | POA: Diagnosis present

## 2020-12-06 IMAGING — MR MR BREAST BILAT WO/W CM
2 of 15 series · 6 of 48 positions shown · IV contrast (8ml Gadavist)
Comparison: Recent mammogram, ultrasound and biopsy examinations.

CLINICAL DATA: Newly diagnosed left breast invasive mammary
carcinoma and metastatic left axillary lymph node.

LABS:  None obtained on site today.
EXAM:
BILATERAL BREAST MRI WITH AND WITHOUT CONTRAST
TECHNIQUE: Multiplanar, multisequence MR images of both breasts were obtained
prior to and following the intravenous administration of 7.5 ml of
Gadavist

[Series 2: T1 · axial · B · 1.5mm · 1.02mm/px · z∈[-100,+90]mm · 5 of 128 slices shown]
[im 1/128]
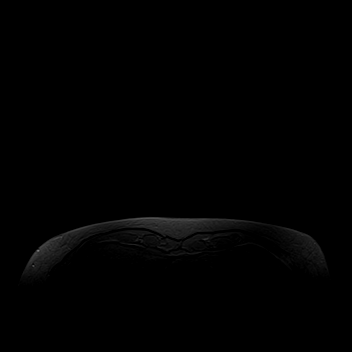
[im 32/128]
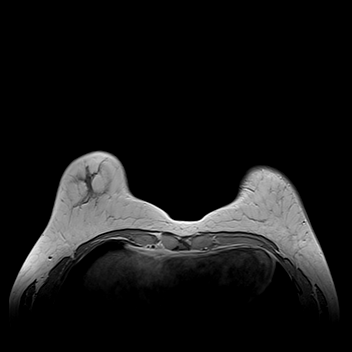
[im 64/128]
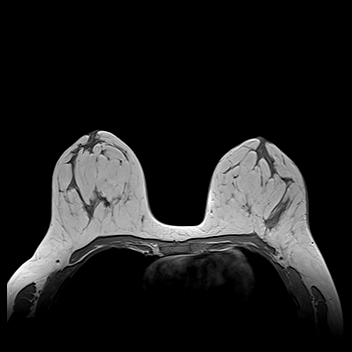
[im 96/128]
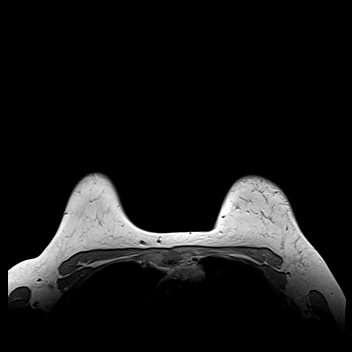
[im 128/128]
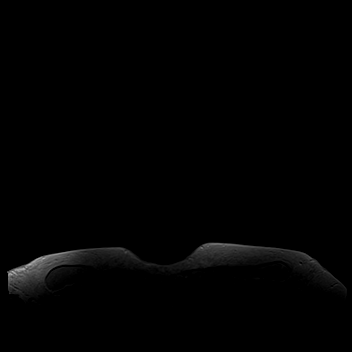

[Series 3: T2 · axial · B · 3.0mm · 1.02mm/px · 1 of 54 slices shown]
[im 1/54]
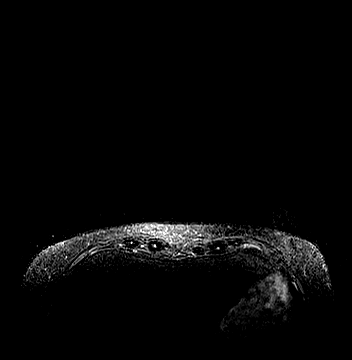

[6 of 48 positions shown; findings below may reference images not displayed]

Three-dimensional MR images were rendered by post-processing of the
original MR data on an independent workstation. The
three-dimensional MR images were interpreted, and findings are
reported in the following complete MRI report for this study. Three
dimensional images were evaluated at the independent interpreting
workstation using the DynaCAD thin client.
FINDINGS: Breast composition: c. Heterogeneous fibroglandular tissue.

Background parenchymal enhancement: Minimal.

Right breast: Clumped and linear non mass enhancement in the
posterior aspect of the upper outer quadrant of the right breast
measuring 4.1 cm in AP diameter on image number 81 series [DATE] cm
in width on image number 83 series 14 and 2.3 cm in cephalo caudal
dimension in the sagittal plane. This has low grade enhancement with
persistent kinetics.

Left breast: Irregular enhancing mass with a central nonenhancing
component in the posterior aspect of the 12 o'clock position of the
left breast, containing a biopsy marker clip artifact. This
corresponds to the recently biopsied invasive mammary carcinoma. On
image number 78 series 10, this measures 2.5 x 2.3 cm in maximum
dimensions. There is a linear component extending 2.3 cm anteriorly
from the medial aspect of this mass. The mass and the anterior
linear extension combined measure 4.3 cm. The mass measures 3.9 cm
in cephalo caudal dimension in the sagittal plane. The mass and
anterior linear extension have a mixture of enhancement kinetics,
including rapid wash-in/washout.

There is also a linear area of clumped, non mass enhancement in the
posterior aspect of the lower outer quadrant of the left breast.
This measures 2.6 x 0.9 cm on image number 49 series 10 and 0.4 cm
in cephalo caudal dimension in the sagittal plane. This also has a
mixture of enhancement kinetics, including rapid wash-in/washout.

Lymph nodes: Post biopsy changes involving a left axillary lymph
node with mild cortical thickening, corresponding to the recently
biopsied metastatic left axillary lymph node. There is a smaller,
similar appearing lymph node more posteriorly, corresponding the 2nd
suspicious lymph node seen at ultrasound.

There are 2 mildly enlarged left internal mammary lymph nodes. The
largest is on image number 35 series 3, measuring 8 x 5 mm.

There are 3 right axillary node with mild cortical thickening.

Ancillary findings:  None.
IMPRESSION: 1. 4.3 x 3.9 x 2.3 cm biopsy-proven invasive mammary carcinoma in
the 12 o'clock position of the left breast, posteriorly. This
includes a 2.3 cm linear component extending anteriorly.
2. 2.6 x 0.9 x 0.4 cm area of linear, clumped, non mass enhancement
in the posterior aspect of the lower outer quadrant of the left
breast. This is suspicious for additional malignancy.
3. Metastatic left axillary and left internal mammary adenopathy and
possible metastatic right axillary adenopathy.
4. 4.1 x 2.3 x 0.9 cm area of clumped and linear non mass
enhancement in the posterior aspect of the upper-outer quadrant of
the right breast suspicious for malignancy.

RECOMMENDATION:
1. MR guided core needle biopsy of the 2.6 cm area of linear,
clumped non mass enhancement in the posterior aspect of the lower
outer quadrant of the left breast.
2. MR guided core needle biopsy of the anterior extent of the 2.3 cm
linear component extending anteriorly from the biopsy proven
invasive mammary carcinoma in the 12 o'clock position of the left
breast.
3. MR guided core needle biopsy of the 4.1 cm area of clumped and
linear non mass enhancement in the posterior upper outer right
breast.
4. Right axillary ultrasound.

BI-RADS CATEGORY  4: Suspicious.

## 2020-12-06 MED ORDER — GADOBUTROL 1 MMOL/ML IV SOLN
7.5000 mL | Freq: Once | INTRAVENOUS | Status: AC | PRN
  Administered 2020-12-06: 7.5 mL via INTRAVENOUS

## 2020-12-06 NOTE — H&P (View-Only) (Signed)
PATIENT PROFILE: Vickie Mathis is a 30 y.o. female who presents to the Clinic for consultation at the request of Dr. Lovie Mathis for evaluation of breast cancer.  PCP:  Provider  HISTORY OF PRESENT ILLNESS: Ms. Vickie Mathis reports she felt a mass on her left breast.  She went to her primary care provider.  Her primary care provider ordered a diagnostic mammogram and ultrasound.  Diagnostic mammogram and ultrasound showed a 2.7 cm mass on the left breast.  Ultrasound also showed abnormal axillary lymph nodes in the left axilla.  Core biopsy was done on the breast mass and the abnormal lymph nodes.  Core biopsy showed invasive mammary carcinoma and lymph node showed metastatic breast cancer.  Patient denies any skin changes or nipple discharge or retraction.  Patient denies any breast pain.  Family history of breast cancer: None Family history of other cancers: Uterine and ovarian from father side Menarche: 1 years old Menopause: N/a Used OCP: No Used estrogen and progesterone therapy: No History of Radiation to the chest: No  PROBLEM LIST: Problem List  Never Reviewed         Noted   Situational mixed anxiety and depressive disorder 07/01/2020   Breast mass, left 07/01/2020      GENERAL REVIEW OF SYSTEMS:   General ROS: negative for - chills, fatigue, fever, weight gain or weight loss Allergy and Immunology ROS: negative for - hives  Hematological and Lymphatic ROS: negative for - bleeding problems or bruising, negative for palpable nodes Endocrine ROS: negative for - heat or cold intolerance, hair changes Respiratory ROS: negative for - cough, shortness of breath or wheezing Cardiovascular ROS: no chest pain or palpitations GI ROS: negative for nausea, vomiting, abdominal pain, diarrhea, constipation Musculoskeletal ROS: negative for - joint swelling or muscle pain Neurological ROS: negative for - confusion, syncope Dermatological ROS: negative for pruritus and rash Psychiatric:  Positive for anxiety, depression, difficulty sleeping and memory loss  MEDICATIONS: Current Outpatient Medications  Medication Sig Dispense Refill  . acetaminophen (TYLENOL) 325 MG tablet as needed    . albuterol 90 mcg/actuation inhaler Inhale 2 inhalations into the lungs every 6 (six) hours as needed for Wheezing 1 Inhaler 0  . busPIRone (BUSPAR) 15 MG tablet Take 1 tablet (15 mg total) by mouth 2 (two) times daily 180 tablet 1  . citalopram (CELEXA) 40 MG tablet Take 1 tablet (40 mg total) by mouth once daily for 60 days 30 tablet 1  . clonazePAM (KLONOPIN) 0.5 MG tablet TAKE 1 TABLET BY MOUTH THREE TIMES DAILY AS NEEDED FOR ANXIETY-AVOID DAILY USE- 15 tablet 0  . ibuprofen (MOTRIN) 200 MG tablet as needed    . JUNEL 1/20, 21, 1-20 mg-mcg tablet Take 1 tablet by mouth once daily    . norethindrone-ethinyl estradiol (JUNEL FE 1/20) 1 mg-20 mcg (21)/75 mg (7) tablet Take 1 tablet by mouth once daily    . buPROPion (WELLBUTRIN XL) 150 MG XL tablet Take 1 tablet (150 mg total) by mouth once daily (Patient not taking: Reported on 12/05/2020  ) 30 tablet 1  . cloNIDine HCL (CATAPRES) 0.1 MG tablet Take 0.1 mg by mouth once daily as needed (Patient not taking: Reported on 12/05/2020  )    . predniSONE (DELTASONE) 10 MG tablet Take 1 tablet (10 mg total) by mouth once daily (Patient not taking: Reported on 12/05/2020  ) 10 tablet 0   No current facility-administered medications for this visit.    ALLERGIES: Amoxil [amoxicillin] and Sulfa (sulfonamide antibiotics)  PAST MEDICAL HISTORY: Past Medical History:  Diagnosis Date  . Absence of varicose veins   . Anxiety   . Breast cancer (CMS-HCC)   . Depression     PAST SURGICAL HISTORY: History reviewed. No pertinent surgical history.   FAMILY HISTORY: Family History  Problem Relation Age of Onset  . Thyroid disease Mother   . Depression Mother   . Obesity Mother   . High blood pressure (Hypertension) Father   . Diabetes type II Father   .  Obesity Father      SOCIAL HISTORY: Social History   Socioeconomic History  . Marital status: Married    Spouse name: Not on file  . Number of children: Not on file  . Years of education: Not on file  . Highest education level: Not on file  Occupational History  . Not on file  Tobacco Use  . Smoking status: Never Smoker  . Smokeless tobacco: Never Used  Vaping Use  . Vaping Use: Never used  Substance and Sexual Activity  . Alcohol use: Yes    Comment: a little bit here and there   . Drug use: Yes    Frequency: 4.0 times per week    Types: Marijuana  . Sexual activity: Not on file  Other Topics Concern  . Not on file  Social History Narrative  . Not on file   Social Determinants of Health   Financial Resource Strain: Not on file  Food Insecurity: Not on file  Transportation Needs: Not on file    PHYSICAL EXAM: Vitals:   12/05/20 0940  BP: 132/71  Pulse: 82   Body mass index is 27.76 kg/m. Weight: 85.3 kg (188 lb)   GENERAL: Alert, active, oriented x3  HEENT: Pupils equal reactive to light. Extraocular movements are intact. Sclera clear. Palpebral conjunctiva normal red color.Pharynx clear.  NECK: Supple with no palpable mass and no adenopathy.  LUNGS: Sound clear with no rales rhonchi or wheezes.  HEART: Regular rhythm S1 and S2 without murmur.  BREAST: right breast normal without mass, skin or nipple changes or axillary nodes.  There is a palpable round mass on the left breast.  Mass is not fixed to any structure.  Mass does not seems to be invading the skin.  No skin changes.  No nipple retraction or discharge.  ABDOMEN: Soft and depressible, nontender with no palpable mass, no hepatomegaly.  EXTREMITIES: Well-developed well-nourished symmetrical with no dependent edema.  NEUROLOGICAL: Awake alert oriented, facial expression symmetrical, moving all extremities.  REVIEW OF DATA: I have reviewed the following data today: Ancillary Orders on  10/17/2020  Component Date Value  . SARS-COV-2, NAA - [facility] 10/17/2020 Not Detected   . Xpress Strep A, PCR 10/17/2020 Not Detected   . SARS-CoV-2, NAA 2 DAY TA* 10/17/2020 Performed      ASSESSMENT: Ms. Vickie Mathis is a 30 y.o. female presenting for consultation for left breast cancer.    Patient was oriented again about the pathology results. Surgical alternatives were discussed with patient including partial vs total mastectomy. Surgical technique and post operative care was discussed with patient. Risk of surgery was discussed with patient including but not limited to: wound infection, seroma, hematoma, brachial plexopathy, mondor's disease (thrombosis of small veins of breast), chronic wound pain, breast lymphedema, altered sensation to the nipple and cosmesis among others.   Due to patient cancer status of being triple negative and being already metastases to the lymph nodes she discussed with medical oncology to start neoadjuvant chemotherapy.  I discussed with the patient the surgical management of insertion of Port-A-Cath.  I discussed with the patient the benefit and the risk of the surgery.  She agreed to proceed.  The patient reports that she is cannot think about the surgical attending for breast cancer.  Since she is going to have neoadjuvant chemotherapy she can have time to ask more question and when she is ready we can discuss when time is appropriate.   Breast cancer metastasized to axillary lymph node, left (CMS-HCC) [C50.912, C77.3]  PLAN: 1. Insertion of port a cath [phone]) 2. Avoid taking aspirin 5 days before surgery 3. Contact us if you have any concern.   Patient verbalized understanding, all questions were answered, and were agreeable with the plan outlined above.    Herbert Pun, MD  Electronically signed by Herbert Pun, MD

## 2020-12-06 NOTE — H&P (Signed)
PATIENT PROFILE: Vickie Mathis is a 30 y.o. female who presents to the Clinic for consultation at the request of Dr. Lovie Mathis for evaluation of breast cancer.  PCP:  Provider  HISTORY OF PRESENT ILLNESS: Vickie Mathis reports she felt a mass on her left breast.  She went to her primary care provider.  Her primary care provider ordered a diagnostic mammogram and ultrasound.  Diagnostic mammogram and ultrasound showed a 2.7 cm mass on the left breast.  Ultrasound also showed abnormal axillary lymph nodes in the left axilla.  Core biopsy was done on the breast mass and the abnormal lymph nodes.  Core biopsy showed invasive mammary carcinoma and lymph node showed metastatic breast cancer.  Patient denies any skin changes or nipple discharge or retraction.  Patient denies any breast pain.  Family history of breast cancer: None Family history of other cancers: Uterine and ovarian from father side Menarche: 55 years old Menopause: N/a Used OCP: No Used estrogen and progesterone therapy: No History of Radiation to the chest: No  PROBLEM LIST: Problem List  Never Reviewed         Noted   Situational mixed anxiety and depressive disorder 07/01/2020   Breast mass, left 07/01/2020      GENERAL REVIEW OF SYSTEMS:   General ROS: negative for - chills, fatigue, fever, weight gain or weight loss Allergy and Immunology ROS: negative for - hives  Hematological and Lymphatic ROS: negative for - bleeding problems or bruising, negative for palpable nodes Endocrine ROS: negative for - heat or cold intolerance, hair changes Respiratory ROS: negative for - cough, shortness of breath or wheezing Cardiovascular ROS: no chest pain or palpitations GI ROS: negative for nausea, vomiting, abdominal pain, diarrhea, constipation Musculoskeletal ROS: negative for - joint swelling or muscle pain Neurological ROS: negative for - confusion, syncope Dermatological ROS: negative for pruritus and rash Psychiatric:  Positive for anxiety, depression, difficulty sleeping and memory loss  MEDICATIONS: Current Outpatient Medications  Medication Sig Dispense Refill  . acetaminophen (TYLENOL) 325 MG tablet as needed    . albuterol 90 mcg/actuation inhaler Inhale 2 inhalations into the lungs every 6 (six) hours as needed for Wheezing 1 Inhaler 0  . busPIRone (BUSPAR) 15 MG tablet Take 1 tablet (15 mg total) by mouth 2 (two) times daily 180 tablet 1  . citalopram (CELEXA) 40 MG tablet Take 1 tablet (40 mg total) by mouth once daily for 60 days 30 tablet 1  . clonazePAM (KLONOPIN) 0.5 MG tablet TAKE 1 TABLET BY MOUTH THREE TIMES DAILY AS NEEDED FOR ANXIETY-AVOID DAILY USE- 15 tablet 0  . ibuprofen (MOTRIN) 200 MG tablet as needed    . JUNEL 1/20, 21, 1-20 mg-mcg tablet Take 1 tablet by mouth once daily    . norethindrone-ethinyl estradiol (JUNEL FE 1/20) 1 mg-20 mcg (21)/75 mg (7) tablet Take 1 tablet by mouth once daily    . buPROPion (WELLBUTRIN XL) 150 MG XL tablet Take 1 tablet (150 mg total) by mouth once daily (Patient not taking: Reported on 12/05/2020  ) 30 tablet 1  . cloNIDine HCL (CATAPRES) 0.1 MG tablet Take 0.1 mg by mouth once daily as needed (Patient not taking: Reported on 12/05/2020  )    . predniSONE (DELTASONE) 10 MG tablet Take 1 tablet (10 mg total) by mouth once daily (Patient not taking: Reported on 12/05/2020  ) 10 tablet 0   No current facility-administered medications for this visit.    ALLERGIES: Amoxil [amoxicillin] and Sulfa (sulfonamide antibiotics)  PAST MEDICAL HISTORY: Past Medical History:  Diagnosis Date  . Absence of varicose veins   . Anxiety   . Breast cancer (CMS-HCC)   . Depression     PAST SURGICAL HISTORY: History reviewed. No pertinent surgical history.   FAMILY HISTORY: Family History  Problem Relation Age of Onset  . Thyroid disease Mother   . Depression Mother   . Obesity Mother   . High blood pressure (Hypertension) Father   . Diabetes type II Father   .  Obesity Father      SOCIAL HISTORY: Social History   Socioeconomic History  . Marital status: Married    Spouse name: Not on file  . Number of children: Not on file  . Years of education: Not on file  . Highest education level: Not on file  Occupational History  . Not on file  Tobacco Use  . Smoking status: Never Smoker  . Smokeless tobacco: Never Used  Vaping Use  . Vaping Use: Never used  Substance and Sexual Activity  . Alcohol use: Yes    Comment: a little bit here and there   . Drug use: Yes    Frequency: 4.0 times per week    Types: Marijuana  . Sexual activity: Not on file  Other Topics Concern  . Not on file  Social History Narrative  . Not on file   Social Determinants of Health   Financial Resource Strain: Not on file  Food Insecurity: Not on file  Transportation Needs: Not on file    PHYSICAL EXAM: Vitals:   12/05/20 0940  BP: 132/71  Pulse: 82   Body mass index is 27.76 kg/m. Weight: 85.3 kg (188 lb)   GENERAL: Alert, active, oriented x3  HEENT: Pupils equal reactive to light. Extraocular movements are intact. Sclera clear. Palpebral conjunctiva normal red color.Pharynx clear.  NECK: Supple with no palpable mass and no adenopathy.  LUNGS: Sound clear with no rales rhonchi or wheezes.  HEART: Regular rhythm S1 and S2 without murmur.  BREAST: right breast normal without mass, skin or nipple changes or axillary nodes.  There is a palpable round mass on the left breast.  Mass is not fixed to any structure.  Mass does not seems to be invading the skin.  No skin changes.  No nipple retraction or discharge.  ABDOMEN: Soft and depressible, nontender with no palpable mass, no hepatomegaly.  EXTREMITIES: Well-developed well-nourished symmetrical with no dependent edema.  NEUROLOGICAL: Awake alert oriented, facial expression symmetrical, moving all extremities.  REVIEW OF DATA: I have reviewed the following data today: Ancillary Orders on  10/17/2020  Component Date Value  . SARS-COV-2, NAA - [facility] 10/17/2020 Not Detected   . Xpress Strep A, PCR 10/17/2020 Not Detected   . SARS-CoV-2, NAA 2 DAY TA* 10/17/2020 Performed      ASSESSMENT: Vickie Mathis is a 30 y.o. female presenting for consultation for left breast cancer.    Patient was oriented again about the pathology results. Surgical alternatives were discussed with patient including partial vs total mastectomy. Surgical technique and post operative care was discussed with patient. Risk of surgery was discussed with patient including but not limited to: wound infection, seroma, hematoma, brachial plexopathy, mondor's disease (thrombosis of small veins of breast), chronic wound pain, breast lymphedema, altered sensation to the nipple and cosmesis among others.   Due to patient cancer status of being triple negative and being already metastases to the lymph nodes she discussed with medical oncology to start neoadjuvant chemotherapy.  I discussed with the patient the surgical management of insertion of Port-A-Cath.  I discussed with the patient the benefit and the risk of the surgery.  She agreed to proceed.  The patient reports that she is cannot think about the surgical attending for breast cancer.  Since she is going to have neoadjuvant chemotherapy she can have time to ask more question and when she is ready we can discuss when time is appropriate.   Breast cancer metastasized to axillary lymph node, left (CMS-HCC) [C50.912, C77.3]  PLAN: 1. Insertion of port a cath [phone]) 2. Avoid taking aspirin 5 days before surgery 3. Contact us if you have any concern.   Patient verbalized understanding, all questions were answered, and were agreeable with the plan outlined above.    Herbert Pun, MD  Electronically signed by Herbert Pun, MD

## 2020-12-09 ENCOUNTER — Other Ambulatory Visit: Payer: Self-pay | Admitting: Internal Medicine

## 2020-12-09 ENCOUNTER — Other Ambulatory Visit: Payer: Self-pay | Admitting: Nurse Practitioner

## 2020-12-09 DIAGNOSIS — C50919 Malignant neoplasm of unspecified site of unspecified female breast: Secondary | ICD-10-CM

## 2020-12-09 NOTE — Progress Notes (Signed)
START ON PATHWAY REGIMEN - Breast     Cycles 1 through 4: A cycle is every 21 days:     Pembrolizumab      Paclitaxel      Carboplatin      Filgrastim-xxxx    Cycles 5 through 8: A cycle is every 21 days:     Pembrolizumab      Doxorubicin      Cyclophosphamide      Pegfilgrastim-xxxx   **Always confirm dose/schedule in your pharmacy ordering system**  Patient Characteristics: Preoperative or Nonsurgical Candidate (Clinical Staging), Neoadjuvant Therapy followed by Surgery, Invasive Disease, Chemotherapy, HER2 Negative/Unknown/Equivocal, ER Negative/Unknown, Platinum Therapy Indicated, High-Risk Disease Present Therapeutic Status: Preoperative or Nonsurgical Candidate (Clinical Staging) AJCC M Category: cM0 AJCC Grade: G3 Breast Surgical Plan: Neoadjuvant Therapy followed by Surgery ER Status: Negative (-) AJCC 8 Stage Grouping: IIIB HER2 Status: Negative (-) AJCC T Category: cT2 AJCC N Category: cN1 PR Status: Negative (-) Type of Therapy: Platinum Therapy Indicated Intent of Therapy: Curative Intent, Discussed with Patient

## 2020-12-09 NOTE — Progress Notes (Signed)
PET denied by insurance. Orders placed for ct and bone scan. If concerning, would reconsider PET.

## 2020-12-10 ENCOUNTER — Telehealth: Payer: Self-pay | Admitting: *Deleted

## 2020-12-10 ENCOUNTER — Other Ambulatory Visit: Payer: Self-pay | Admitting: Internal Medicine

## 2020-12-10 NOTE — Progress Notes (Signed)
ON PATHWAY REGIMEN - Breast  No Change  Continue With Treatment as Ordered.  Original Decision Date/Time: 12/09/2020 20:52     Cycles 1 through 4: A cycle is every 21 days:     Pembrolizumab      Paclitaxel      Carboplatin      Filgrastim-xxxx    Cycles 5 through 8: A cycle is every 21 days:     Pembrolizumab      Doxorubicin      Cyclophosphamide      Pegfilgrastim-xxxx   **Always confirm dose/schedule in your pharmacy ordering system**  Patient Characteristics: Preoperative or Nonsurgical Candidate (Clinical Staging), Neoadjuvant Therapy followed by Surgery, Invasive Disease, Chemotherapy, HER2 Negative/Unknown/Equivocal, ER Negative/Unknown, Platinum Therapy Indicated, High-Risk Disease Present Therapeutic Status: Preoperative or Nonsurgical Candidate (Clinical Staging) AJCC M Category: cM0 AJCC Grade: G3 Breast Surgical Plan: Neoadjuvant Therapy followed by Surgery ER Status: Negative (-) AJCC 8 Stage Grouping: IIIB HER2 Status: Negative (-) AJCC T Category: cT2 AJCC N Category: cN1 PR Status: Negative (-) Type of Therapy: Platinum Therapy Indicated Intent of Therapy: Curative Intent, Discussed with Patient

## 2020-12-10 NOTE — Telephone Encounter (Signed)
I called [facility] to see if they can move up pt to another day to get scna before her chemo tarts

## 2020-12-11 ENCOUNTER — Telehealth: Payer: Self-pay

## 2020-12-11 ENCOUNTER — Ambulatory Visit (HOSPITAL_COMMUNITY): Payer: 59

## 2020-12-11 ENCOUNTER — Ambulatory Visit
Admission: RE | Admit: 2020-12-11 | Discharge: 2020-12-11 | Disposition: A | Discharge status: Home / Self Care | Payer: 59 | Source: Ambulatory Visit | Attending: Nurse Practitioner | Admitting: Nurse Practitioner

## 2020-12-11 ENCOUNTER — Encounter
Admission: RE | Admit: 2020-12-11 | Discharge: 2020-12-11 | Disposition: A | Discharge status: Home / Self Care | Payer: 59 | Source: Ambulatory Visit | Attending: Nurse Practitioner | Admitting: Nurse Practitioner

## 2020-12-11 ENCOUNTER — Other Ambulatory Visit: Payer: Self-pay

## 2020-12-11 ENCOUNTER — Other Ambulatory Visit
Admission: RE | Admit: 2020-12-11 | Discharge: 2020-12-11 | Disposition: A | Discharge status: Home / Self Care | Payer: 59 | Source: Ambulatory Visit | Attending: General Surgery | Admitting: General Surgery

## 2020-12-11 ENCOUNTER — Other Ambulatory Visit: Payer: Self-pay | Admitting: Oncology

## 2020-12-11 DIAGNOSIS — C50919 Malignant neoplasm of unspecified site of unspecified female breast: Secondary | ICD-10-CM | POA: Insufficient documentation

## 2020-12-11 DIAGNOSIS — Z01812 Encounter for preprocedural laboratory examination: Secondary | ICD-10-CM | POA: Insufficient documentation

## 2020-12-11 DIAGNOSIS — Z20822 Contact with and (suspected) exposure to covid-19: Secondary | ICD-10-CM | POA: Insufficient documentation

## 2020-12-11 DIAGNOSIS — N6310 Unspecified lump in the right breast, unspecified quadrant: Secondary | ICD-10-CM

## 2020-12-11 HISTORY — DX: Unspecified asthma, uncomplicated: J45.909

## 2020-12-11 HISTORY — DX: Malignant (primary) neoplasm, unspecified: C80.1

## 2020-12-11 HISTORY — DX: Anxiety disorder, unspecified: F41.9

## 2020-12-11 HISTORY — DX: Depression, unspecified: F32.A

## 2020-12-11 LAB — SARS CORONAVIRUS 2 (TAT 6-24 HRS): SARS Coronavirus 2: NEGATIVE

## 2020-12-11 IMAGING — NM NM BONE WHOLE BODY
2 series · 10 of 10 positions shown · non-contrast
Comparison: None.

CLINICAL DATA: Breast cancer, initial staging examination

EXAM:
NUCLEAR MEDICINE WHOLE BODY BONE SCAN
TECHNIQUE: Whole body anterior and posterior images were obtained approximately
3 hours after intravenous injection of radiopharmaceutical.
RADIOPHARMACEUTICALS:  22.737 mCi [3V] MDP IV

[Series 1000: statics · 2.40mm/px · 4 acquisitions, 8 frames shown]
[im 1/4]
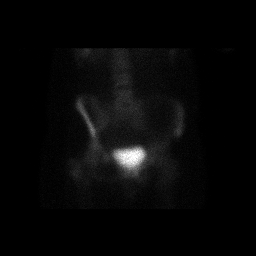
[im 1/4]
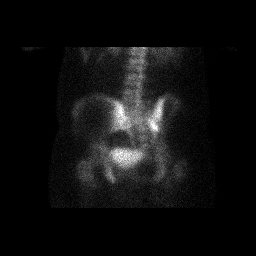
[im 2/4]
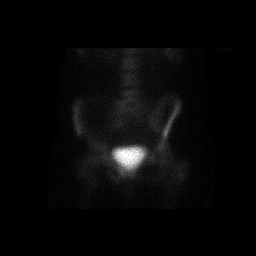
[im 2/4]
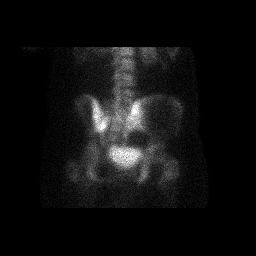
[im 3/4]
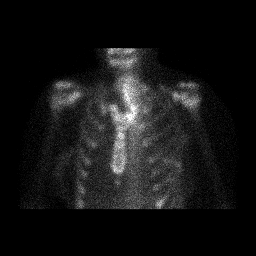
[im 3/4]
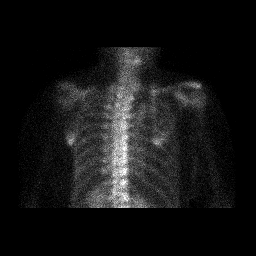
[im 4/4]
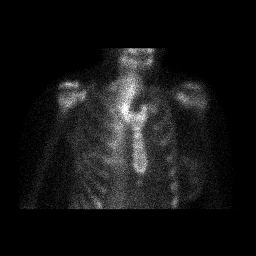
[im 4/4]
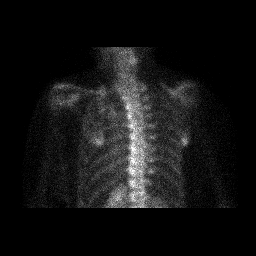

[Series 1000: 3 hr wholebody · 2.40mm/px · 2 of 2 frames shown]
[frame 1/2]
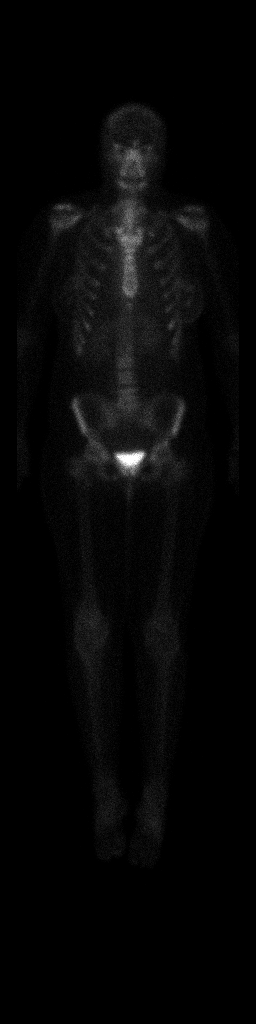
[frame 2/2]
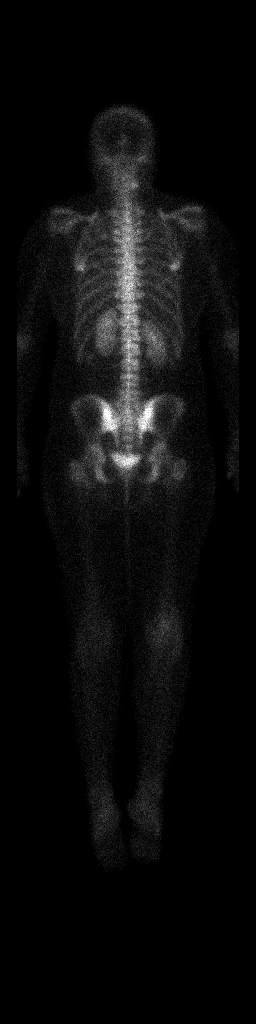

[10 of 10 positions shown; findings below may reference images not displayed]

FINDINGS: Normal distribution of radiotracer within the visualized axial and
appendicular skeleton. No evidence of osseous metastatic disease.
Mild soft tissue uptake within the breast tissue bilaterally.
Otherwise normal soft tissue distribution. Normal uptake and
excretion within the kidneys and bladder.
IMPRESSION: No evidence of osseous metastatic disease.

## 2020-12-11 MED ORDER — TECHNETIUM TC 99M MEDRONATE IV KIT
20.0000 | PACK | Freq: Once | INTRAVENOUS | Status: AC | PRN
  Administered 2020-12-11: 22.737 via INTRAVENOUS

## 2020-12-11 NOTE — Telephone Encounter (Signed)
12/11/20 Sent patient a my chart message about upcoming appointments, patient's phone is not set up to receive voicemail's.  [facility]

## 2020-12-11 NOTE — Patient Instructions (Addendum)
Your procedure is scheduled on: Friday December 13, 2020. Report to Day Surgery inside [facility] 2nd floor (stop by Admissions desk first, before getting on Elevator). To find out your arrival time please call [phone] between 1PM - 3PM on Thursday December 12, 2020.  Remember: Instructions that are not followed completely may result in serious medical risk,  up to and including death, or upon the discretion of your surgeon and anesthesiologist your  surgery may need to be rescheduled.     _X__ 1. Do not eat food or drink fluids after midnight the night before your procedure.                 No chewing gum or hard candies.   __X__2.  On the morning of surgery brush your teeth with toothpaste and water, you                may rinse your mouth with mouthwash if you wish.  Do not swallow any toothpaste of mouthwash.     _X__ 3.  No Alcohol for 24 hours before or after surgery.   _X__ 4.  Do Not Smoke or use e-cigarettes For 24 Hours Prior to Your Surgery.                 Do not use any chewable tobacco products for at least 6 hours prior to                 Surgery.  _X__  5.  Do not use any recreational drugs (marijuana, cocaine, heroin, ecstasy, MDMA or other)                For at least one week prior to your surgery.  Combination of these drugs with anesthesia                May have life threatening results.  __X__ 6.  Notify your doctor if there is any change in your medical condition      (cold, fever, infections).     Do not wear jewelry, make-up, hairpins, clips or nail polish. Do not wear lotions, powders, or perfumes. You may wear deodorant. Do not shave 48 hours prior to surgery. Men may shave face and neck. Do not bring valuables to the hospital.    [facility] is not responsible for any belongings or valuables.  Contacts, dentures or bridgework may not be worn into surgery. Leave your suitcase in the car. After surgery it may be brought to your  room. For patients admitted to the hospital, discharge time is determined by your treatment team.   Patients discharged the day of surgery will not be allowed to drive home.   Make arrangements for someone to be with you for the first 24 hours of your Same Day Discharge.  __X__ Take these medicines the morning of surgery with A SIP OF WATER:    1. busPIRone (BUSPAR) 15 MG   2. clonazePAM (KLONOPIN) 0.5 MG   3.   4.  5.  6.  ____ Fleet Enema (as directed)   __X__ Use CHG Soap (or wipes) as directed  ____ Use Benzoyl Peroxide Gel as instructed  ____ Use inhalers on the day of surgery  ____ Stop metformin 2 days prior to surgery    __X__ Stop Anti-inflammatories such as Ibuprofen, Aleve, Advil, naproxen, aspirin, and or BC powders.    __X__ Stop supplements until after surgery.    __X__ Do not start any herbal supplements  before your procedure.    If you have any questions regarding your pre-procedure instructions,  Please call Pre-admit Testing at [phone].

## 2020-12-12 ENCOUNTER — Other Ambulatory Visit: Payer: 59

## 2020-12-12 NOTE — Progress Notes (Signed)
Recent breast MRI recommendation right breast MRI guided biopsy.  Per Dr Janese Banks, biopsy ordered, and scheduled for 12/16/20 at [place].  Patient aware of appointment, and instructions NPO 4 hours prior to procedure.

## 2020-12-13 ENCOUNTER — Ambulatory Visit: Payer: 59

## 2020-12-13 ENCOUNTER — Ambulatory Visit: Payer: 59 | Admitting: Anesthesiology

## 2020-12-13 ENCOUNTER — Encounter: Payer: Self-pay | Admitting: General Surgery

## 2020-12-13 ENCOUNTER — Other Ambulatory Visit: Payer: Self-pay | Admitting: Surgery

## 2020-12-13 ENCOUNTER — Ambulatory Visit
Admission: RE | Admit: 2020-12-13 | Discharge: 2020-12-13 | Disposition: A | Discharge status: Home / Self Care | Payer: 59 | Attending: General Surgery | Admitting: General Surgery

## 2020-12-13 ENCOUNTER — Other Ambulatory Visit: Payer: Self-pay

## 2020-12-13 ENCOUNTER — Encounter
Admission: RE | Disposition: A | Discharge status: Home / Self Care | Payer: Self-pay | Source: Home / Self Care | Attending: General Surgery

## 2020-12-13 DIAGNOSIS — Z79899 Other long term (current) drug therapy: Secondary | ICD-10-CM | POA: Diagnosis not present

## 2020-12-13 DIAGNOSIS — Z880 Allergy status to penicillin: Secondary | ICD-10-CM | POA: Diagnosis not present

## 2020-12-13 DIAGNOSIS — Z793 Long term (current) use of hormonal contraceptives: Secondary | ICD-10-CM | POA: Diagnosis not present

## 2020-12-13 DIAGNOSIS — C773 Secondary and unspecified malignant neoplasm of axilla and upper limb lymph nodes: Secondary | ICD-10-CM | POA: Insufficient documentation

## 2020-12-13 DIAGNOSIS — C50912 Malignant neoplasm of unspecified site of left female breast: Secondary | ICD-10-CM | POA: Diagnosis present

## 2020-12-13 DIAGNOSIS — Z882 Allergy status to sulfonamides status: Secondary | ICD-10-CM | POA: Insufficient documentation

## 2020-12-13 DIAGNOSIS — Z95828 Presence of other vascular implants and grafts: Secondary | ICD-10-CM

## 2020-12-13 DIAGNOSIS — Z171 Estrogen receptor negative status [ER-]: Secondary | ICD-10-CM | POA: Diagnosis not present

## 2020-12-13 HISTORY — PX: PORTACATH PLACEMENT: SHX2246

## 2020-12-13 LAB — URINE DRUG SCREEN, QUALITATIVE (ARMC ONLY)
Amphetamines, Ur Screen: NOT DETECTED
Barbiturates, Ur Screen: NOT DETECTED
Benzodiazepine, Ur Scrn: NOT DETECTED
Cannabinoid 50 Ng, Ur ~~LOC~~: POSITIVE — AB
Cocaine Metabolite,Ur ~~LOC~~: NOT DETECTED
MDMA (Ecstasy)Ur Screen: NOT DETECTED
Methadone Scn, Ur: NOT DETECTED
Opiate, Ur Screen: NOT DETECTED
Phencyclidine (PCP) Ur S: NOT DETECTED
Tricyclic, Ur Screen: NOT DETECTED

## 2020-12-13 LAB — POCT PREGNANCY, URINE: Preg Test, Ur: NEGATIVE

## 2020-12-13 IMAGING — DX DG CHEST 1V
1 series · 1 of 1 positions shown · non-contrast
Comparison: None.

CLINICAL DATA: Port-A-Cath insertion

EXAM:
CHEST  1 VIEW

[chest ap]
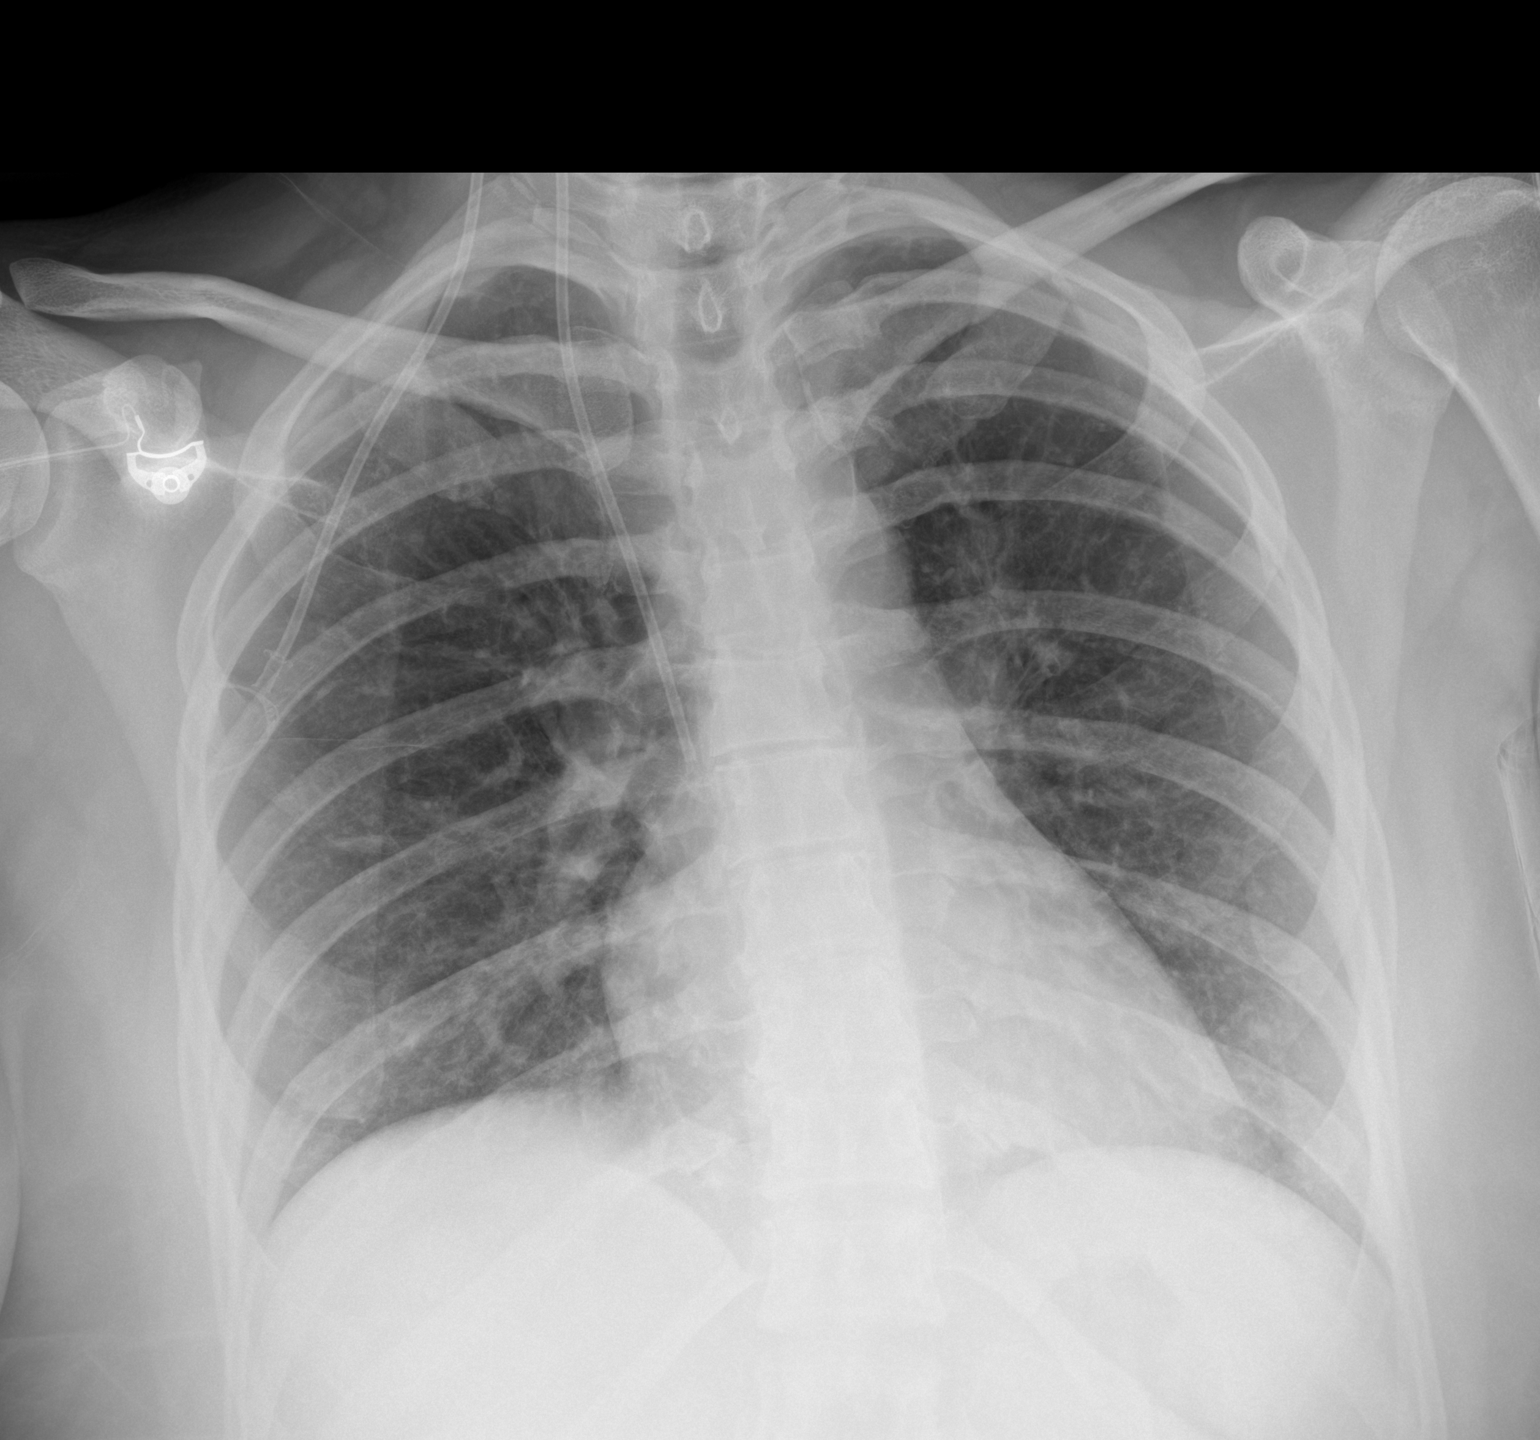

[1 of 1 positions shown; findings below may reference images not displayed]

FINDINGS: Port-A-Cath tip is in the superior vena cava. No pneumothorax. Lungs
are clear. Heart size and pulmonary vascularity are normal. No
adenopathy. No bone lesions.
IMPRESSION: Port-A-Cath tip in superior vena cava. No pneumothorax. Lungs clear.
Cardiac silhouette normal.

## 2020-12-13 SURGERY — INSERTION, TUNNELED CENTRAL VENOUS DEVICE, WITH PORT
Anesthesia: General | Site: Chest | Laterality: Right

## 2020-12-13 MED ORDER — BUPIVACAINE-EPINEPHRINE (PF) 0.25% -1:200000 IJ SOLN
INTRAMUSCULAR | Status: DC | PRN
  Administered 2020-12-13: 10 mL

## 2020-12-13 MED ORDER — CHLORHEXIDINE GLUCONATE 0.12 % MT SOLN: 15.0000 mL | Freq: Once | OROMUCOSAL | Status: AC

## 2020-12-13 MED ORDER — ACETAMINOPHEN 10 MG/ML IV SOLN
INTRAVENOUS | Status: DC | PRN
  Administered 2020-12-13: 1000 mg via INTRAVENOUS

## 2020-12-13 MED ORDER — ONDANSETRON HCL 4 MG/2ML IJ SOLN
INTRAMUSCULAR | Status: AC
  Filled 2020-12-13: qty 2

## 2020-12-13 MED ORDER — DEXMEDETOMIDINE (PRECEDEX) IN NS 20 MCG/5ML (4 MCG/ML) IV SYRINGE
PREFILLED_SYRINGE | INTRAVENOUS | Status: AC
  Filled 2020-12-13: qty 5

## 2020-12-13 MED ORDER — PROPOFOL 500 MG/50ML IV EMUL
INTRAVENOUS | Status: AC
  Filled 2020-12-13: qty 50

## 2020-12-13 MED ORDER — FAMOTIDINE 20 MG PO TABS: 20.0000 mg | ORAL_TABLET | Freq: Once | ORAL | Status: AC

## 2020-12-13 MED ORDER — CLINDAMYCIN PHOSPHATE 900 MG/50ML IV SOLN
900.0000 mg | INTRAVENOUS | Status: AC
  Administered 2020-12-13: 900 mg via INTRAVENOUS

## 2020-12-13 MED ORDER — ACETAMINOPHEN 10 MG/ML IV SOLN
INTRAVENOUS | Status: AC
  Filled 2020-12-13: qty 100

## 2020-12-13 MED ORDER — ORAL CARE MOUTH RINSE: 15.0000 mL | Freq: Once | OROMUCOSAL | Status: AC

## 2020-12-13 MED ORDER — FENTANYL CITRATE (PF) 100 MCG/2ML IJ SOLN: 25.0000 ug | INTRAMUSCULAR | Status: DC | PRN

## 2020-12-13 MED ORDER — PROPOFOL 10 MG/ML IV BOLUS
INTRAVENOUS | Status: AC
  Filled 2020-12-13: qty 20

## 2020-12-13 MED ORDER — HEPARIN SODIUM (PORCINE) 5000 UNIT/ML IJ SOLN
INTRAMUSCULAR | Status: AC
  Filled 2020-12-13: qty 1

## 2020-12-13 MED ORDER — MIDAZOLAM HCL 2 MG/2ML IJ SOLN
INTRAMUSCULAR | Status: DC | PRN
  Administered 2020-12-13: 2 mg via INTRAVENOUS

## 2020-12-13 MED ORDER — HYDROCODONE-ACETAMINOPHEN 5-325 MG PO TABS
1.0000 | ORAL_TABLET | ORAL | 0 refills | Status: AC | PRN
Start: 2020-12-13 — End: 2020-12-16

## 2020-12-13 MED ORDER — LIDOCAINE HCL (PF) 2 % IJ SOLN
INTRAMUSCULAR | Status: AC
  Filled 2020-12-13: qty 5

## 2020-12-13 MED ORDER — LACTATED RINGERS IV SOLN: INTRAVENOUS | Status: DC

## 2020-12-13 MED ORDER — FENTANYL CITRATE (PF) 100 MCG/2ML IJ SOLN
INTRAMUSCULAR | Status: AC
  Filled 2020-12-13: qty 2

## 2020-12-13 MED ORDER — HYDROCODONE-ACETAMINOPHEN 5-325 MG PO TABS: 1.0000 | ORAL_TABLET | ORAL | 0 refills | Status: DC | PRN

## 2020-12-13 MED ORDER — SODIUM CHLORIDE 0.9 % IV SOLN
INTRAVENOUS | Status: DC | PRN
  Administered 2020-12-13: 10 mL via INTRAMUSCULAR

## 2020-12-13 MED ORDER — ONDANSETRON HCL 4 MG/2ML IJ SOLN: 4.0000 mg | Freq: Once | INTRAMUSCULAR | Status: DC | PRN

## 2020-12-13 MED ORDER — FAMOTIDINE 20 MG PO TABS
ORAL_TABLET | ORAL | Status: AC
  Administered 2020-12-13: 20 mg via ORAL
  Filled 2020-12-13: qty 1

## 2020-12-13 MED ORDER — CHLORHEXIDINE GLUCONATE 0.12 % MT SOLN
OROMUCOSAL | Status: AC
  Administered 2020-12-13: 15 mL via OROMUCOSAL
  Filled 2020-12-13: qty 15

## 2020-12-13 MED ORDER — BUPIVACAINE-EPINEPHRINE (PF) 0.25% -1:200000 IJ SOLN
INTRAMUSCULAR | Status: AC
  Filled 2020-12-13: qty 30

## 2020-12-13 MED ORDER — FENTANYL CITRATE (PF) 100 MCG/2ML IJ SOLN
INTRAMUSCULAR | Status: DC | PRN
  Administered 2020-12-13: 50 ug via INTRAVENOUS
  Administered 2020-12-13 (×2): 25 ug via INTRAVENOUS

## 2020-12-13 MED ORDER — LIDOCAINE HCL (PF) 2 % IJ SOLN
INTRAMUSCULAR | Status: DC | PRN
  Administered 2020-12-13: 100 mg via INTRADERMAL

## 2020-12-13 MED ORDER — KETOROLAC TROMETHAMINE 30 MG/ML IJ SOLN
INTRAMUSCULAR | Status: DC | PRN
  Administered 2020-12-13: 30 mg via INTRAVENOUS

## 2020-12-13 MED ORDER — PROPOFOL 500 MG/50ML IV EMUL
INTRAVENOUS | Status: DC | PRN
  Administered 2020-12-13: 100 ug/kg/min via INTRAVENOUS

## 2020-12-13 MED ORDER — DEXMEDETOMIDINE (PRECEDEX) IN NS 20 MCG/5ML (4 MCG/ML) IV SYRINGE
PREFILLED_SYRINGE | INTRAVENOUS | Status: DC | PRN
  Administered 2020-12-13 (×2): 10 ug via INTRAVENOUS

## 2020-12-13 MED ORDER — ONDANSETRON HCL 4 MG/2ML IJ SOLN
INTRAMUSCULAR | Status: DC | PRN
  Administered 2020-12-13: 4 mg via INTRAVENOUS

## 2020-12-13 MED ORDER — PROPOFOL 10 MG/ML IV BOLUS
INTRAVENOUS | Status: DC | PRN
  Administered 2020-12-13: 30 mg via INTRAVENOUS
  Administered 2020-12-13: 80 mg via INTRAVENOUS
  Administered 2020-12-13: 40 mg via INTRAVENOUS

## 2020-12-13 MED ORDER — CLINDAMYCIN PHOSPHATE 900 MG/50ML IV SOLN
INTRAVENOUS | Status: AC
  Filled 2020-12-13: qty 50

## 2020-12-13 MED ORDER — KETOROLAC TROMETHAMINE 30 MG/ML IJ SOLN
INTRAMUSCULAR | Status: AC
  Filled 2020-12-13: qty 1

## 2020-12-13 MED ORDER — MIDAZOLAM HCL 2 MG/2ML IJ SOLN
INTRAMUSCULAR | Status: AC
  Filled 2020-12-13: qty 2

## 2020-12-13 MED ORDER — SODIUM CHLORIDE (PF) 0.9 % IJ SOLN
INTRAMUSCULAR | Status: AC
  Filled 2020-12-13: qty 50

## 2020-12-13 SURGICAL SUPPLY — 36 items
ADH SKN CLS APL DERMABOND .7 (GAUZE/BANDAGES/DRESSINGS) ×1
APL PRP STRL LF DISP 70% ISPRP (MISCELLANEOUS) ×1
BAG DECANTER FOR FLEXI CONT (MISCELLANEOUS) ×2 IMPLANT
BLADE SURG SZ11 CARB STEEL (BLADE) ×2 IMPLANT
BOOT SUTURE AID YELLOW STND (SUTURE) ×2 IMPLANT
CHLORAPREP W/TINT 26 (MISCELLANEOUS) ×2 IMPLANT
COVER LIGHT HANDLE STERIS (MISCELLANEOUS) ×4 IMPLANT
COVER WAND RF STERILE (DRAPES) ×2 IMPLANT
DERMABOND ADVANCED (GAUZE/BANDAGES/DRESSINGS) ×1
DERMABOND ADVANCED .7 DNX12 (GAUZE/BANDAGES/DRESSINGS) ×1 IMPLANT
DRAPE C-ARM XRAY 36X54 (DRAPES) ×2 IMPLANT
ELECT CAUTERY BLADE 6.4 (BLADE) ×4 IMPLANT
ELECT REM PT RETURN 9FT ADLT (ELECTROSURGICAL) ×2
ELECTRODE REM PT RTRN 9FT ADLT (ELECTROSURGICAL) ×1 IMPLANT
GLOVE SURG ENC MOIS LTX SZ6.5 (GLOVE) ×2 IMPLANT
GLOVE SURG UNDER POLY LF SZ6.5 (GLOVE) ×2 IMPLANT
GOWN STRL REUS W/ TWL LRG LVL3 (GOWN DISPOSABLE) ×3 IMPLANT
GOWN STRL REUS W/TWL LRG LVL3 (GOWN DISPOSABLE) ×6
IV NS 500ML (IV SOLUTION) ×2
IV NS 500ML BAXH (IV SOLUTION) ×1 IMPLANT
KIT PORT POWER 8FR ISP CVUE (Port) ×2 IMPLANT
KIT TURNOVER KIT A (KITS) ×2 IMPLANT
LABEL OR SOLS (LABEL) ×2 IMPLANT
MANIFOLD NEPTUNE II (INSTRUMENTS) ×2 IMPLANT
NEEDLE FILTER BLUNT 18X 1/2SAF (NEEDLE) ×1
NEEDLE FILTER BLUNT 18X1 1/2 (NEEDLE) ×1 IMPLANT
PACK PORT-A-CATH (MISCELLANEOUS) ×2 IMPLANT
SUT MNCRL AB 4-0 PS2 18 (SUTURE) ×2 IMPLANT
SUT PROLENE 2 0 FS (SUTURE) ×2 IMPLANT
SUT VIC AB 2-0 SH 27 (SUTURE) ×2
SUT VIC AB 2-0 SH 27XBRD (SUTURE) ×1 IMPLANT
SUT VIC AB 3-0 SH 27 (SUTURE) ×2
SUT VIC AB 3-0 SH 27X BRD (SUTURE) ×1 IMPLANT
SYR 10ML LL (SYRINGE) ×2 IMPLANT
SYR 3ML LL SCALE MARK (SYRINGE) ×2 IMPLANT
TOWEL OR 17X26 4PK STRL BLUE (TOWEL DISPOSABLE) ×2 IMPLANT

## 2020-12-13 NOTE — Progress Notes (Signed)
12/13/20  Wrote patient new prescription for Norco as her pharmacy [facility] does not have it.  After talking to patient, will send new prescription to [facility].   Olean Ree, MD

## 2020-12-13 NOTE — Interval H&P Note (Signed)
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  The various methods of treatment have been discussed with the patient and family. After consideration of risks, benefits and other options for treatment, the patient has consented to  Procedure(s): INSERTION PORT-A-CATH (N/A) as a surgical intervention.  The patient's history has been reviewed, patient examined, no change in status, stable for surgery.  I have reviewed the patient's chart and labs.  Questions were answered to the patient's satisfaction.     Herbert Pun

## 2020-12-13 NOTE — Anesthesia Postprocedure Evaluation (Signed)
Anesthesia Post Note  Patient: Vickie Mathis  Procedure(s) Performed: INSERTION PORT-A-CATH (Right Chest)  Patient location during evaluation: PACU Anesthesia Type: General Level of consciousness: awake and alert and oriented Pain management: pain level controlled Vital Signs Assessment: post-procedure vital signs reviewed and stable Respiratory status: spontaneous breathing, nonlabored ventilation and respiratory function stable Cardiovascular status: blood pressure returned to baseline and stable Postop Assessment: no signs of nausea or vomiting Anesthetic complications: no   No complications documented.   Last Vitals:  Vitals:   12/13/20 1135 12/13/20 1143  BP: (!) 93/53 (!) 107/53  Pulse: 64 (!) 59  Resp: 16 18  Temp: (!) 36.1 C 36.4 C  SpO2: 98% 100%    Last Pain:  Vitals:   12/13/20 1143  TempSrc: Temporal  PainSc: 0-No pain                 Taelor Waymire

## 2020-12-13 NOTE — Discharge Instructions (Signed)
AMBULATORY SURGERY  DISCHARGE INSTRUCTIONS   1) The drugs that you were given will stay in your system until tomorrow so for the next 24 hours you should not:  A) Drive an automobile B) Make any legal decisions C) Drink any alcoholic beverage   2) You may resume regular meals tomorrow.  Today it is better to start with liquids and gradually work up to solid foods.  You may eat anything you prefer, but it is better to start with liquids, then soup and crackers, and gradually work up to solid foods.   3) Please notify your doctor immediately if you have any unusual bleeding, trouble breathing, redness and pain at the surgery site, drainage, fever, or pain not relieved by medication. 4)   5) Your post-operative visit with Dr.                                     is: Date:                        Time:    Please call to schedule your post-operative visit.  6) Additional Instructions:       Diet: Resume home heart healthy regular diet.   Activity: Increase activity as tolerated, but light activity and walking are encouraged. Do not drive or drink alcohol if taking narcotic pain medications.  Wound care: May shower with soapy water and pat dry (do not rub incisions), but no baths or submerging incision underwater until follow-up. (no swimming)   Medications: Resume all home medications. For mild to moderate pain: acetaminophen (Tylenol) or ibuprofen (if no kidney disease). Combining Tylenol with alcohol can substantially increase your risk of causing liver disease. Narcotic pain medications, if prescribed, can be used for severe pain, though may cause nausea, constipation, and drowsiness. Do not combine Tylenol and Norco within a 6 hour period as Norco contains Tylenol. If you do not need the narcotic pain medication, you do not need to fill the prescription.  Call office [phone]) at any time if any questions, worsening pain, fevers/chills, bleeding, drainage from incision site,  or other concerns.

## 2020-12-13 NOTE — Patient Instructions (Signed)
Pembrolizumab injection What is this medicine? PEMBROLIZUMAB (pem broe liz ue mab) is a monoclonal antibody. It is used to treat certain types of cancer. This medicine may be used for other purposes; ask your health care provider or pharmacist if you have questions. COMMON BRAND NAME(S): Keytruda What should I tell my health care provider before I take this medicine? They need to know if you have any of these conditions:  autoimmune diseases like Crohn's disease, ulcerative colitis, or lupus  have had or planning to have an allogeneic stem cell transplant (uses someone else's stem cells)  history of organ transplant  history of chest radiation  nervous system problems like myasthenia gravis or Guillain-Barre syndrome  an unusual or allergic reaction to pembrolizumab, other medicines, foods, dyes, or preservatives  pregnant or trying to get pregnant  breast-feeding How should I use this medicine? This medicine is for infusion into a vein. It is given by a health care professional in a hospital or clinic setting. A special MedGuide will be given to you before each treatment. Be sure to read this information carefully each time. Talk to your pediatrician regarding the use of this medicine in children. While this drug may be prescribed for children as young as 6 months for selected conditions, precautions do apply. Overdosage: If you think you have taken too much of this medicine contact a poison control center or emergency room at once. NOTE: This medicine is only for you. Do not share this medicine with others. What if I miss a dose? It is important not to miss your dose. Call your doctor or health care professional if you are unable to keep an appointment. What may interact with this medicine? Interactions have not been studied. This list may not describe all possible interactions. Give your health care provider a list of all the medicines, herbs, non-prescription drugs, or dietary  supplements you use. Also tell them if you smoke, drink alcohol, or use illegal drugs. Some items may interact with your medicine. What should I watch for while using this medicine? Your condition will be monitored carefully while you are receiving this medicine. You may need blood work done while you are taking this medicine. Do not become pregnant while taking this medicine or for 4 months after stopping it. Women should inform their doctor if they wish to become pregnant or think they might be pregnant. There is a potential for serious side effects to an unborn child. Talk to your health care professional or pharmacist for more information. Do not breast-feed an infant while taking this medicine or for 4 months after the last dose. What side effects may I notice from receiving this medicine? Side effects that you should report to your doctor or health care professional as soon as possible:  allergic reactions like skin rash, itching or hives, swelling of the face, lips, or tongue  bloody or black, tarry  breathing problems  changes in vision  chest pain  chills  confusion  constipation  cough  diarrhea  dizziness or feeling faint or lightheaded  fast or irregular heartbeat  fever  flushing  joint pain  low blood counts - this medicine may decrease the number of white blood cells, red blood cells and platelets. You may be at increased risk for infections and bleeding.  muscle pain  muscle weakness  pain, tingling, numbness in the hands or feet  persistent headache  redness, blistering, peeling or loosening of the skin, including inside the mouth  signs and   symptoms of high blood sugar such as dizziness; dry mouth; dry skin; fruity breath; nausea; stomach pain; increased hunger or thirst; increased urination  signs and symptoms of kidney injury like trouble passing urine or change in the amount of urine  signs and symptoms of liver injury like dark urine,  light-colored stools, loss of appetite, nausea, right upper belly pain, yellowing of the eyes or skin  sweating  swollen lymph nodes  weight loss Side effects that usually do not require medical attention (report to your doctor or health care professional if they continue or are bothersome):  decreased appetite  hair loss  tiredness This list may not describe all possible side effects. Call your doctor for medical advice about side effects. You may report side effects to FDA at 1-800-FDA-1088. Where should I keep my medicine? This drug is given in a hospital or clinic and will not be stored at home. NOTE: This sheet is a summary. It may not cover all possible information. If you have questions about this medicine, talk to your doctor, pharmacist, or health care provider.  2021 Elsevier/Gold Standard (2019-08-23 21:44:53) Paclitaxel injection What is this medicine? PACLITAXEL (PAK li TAX el) is a chemotherapy drug. It targets fast dividing cells, like cancer cells, and causes these cells to die. This medicine is used to treat ovarian cancer, breast cancer, lung cancer, Kaposi's sarcoma, and other cancers. This medicine may be used for other purposes; ask your health care provider or pharmacist if you have questions. COMMON BRAND NAME(S): Onxol, Taxol What should I tell my health care provider before I take this medicine? They need to know if you have any of these conditions:  history of irregular heartbeat  liver disease  low blood counts, like low white cell, platelet, or red cell counts  lung or breathing disease, like asthma  tingling of the fingers or toes, or other nerve disorder  an unusual or allergic reaction to paclitaxel, alcohol, polyoxyethylated castor oil, other chemotherapy, other medicines, foods, dyes, or preservatives  pregnant or trying to get pregnant  breast-feeding How should I use this medicine? This drug is given as an infusion into a vein. It is  administered in a hospital or clinic by a specially trained health care professional. Talk to your pediatrician regarding the use of this medicine in children. Special care may be needed. Overdosage: If you think you have taken too much of this medicine contact a poison control center or emergency room at once. NOTE: This medicine is only for you. Do not share this medicine with others. What if I miss a dose? It is important not to miss your dose. Call your doctor or health care professional if you are unable to keep an appointment. What may interact with this medicine? Do not take this medicine with any of the following medications:  live virus vaccines This medicine may also interact with the following medications:  antiviral medicines for hepatitis, HIV or AIDS  certain antibiotics like erythromycin and clarithromycin  certain medicines for fungal infections like ketoconazole and itraconazole  certain medicines for seizures like carbamazepine, phenobarbital, phenytoin  gemfibrozil  nefazodone  rifampin  St. John's wort This list may not describe all possible interactions. Give your health care provider a list of all the medicines, herbs, non-prescription drugs, or dietary supplements you use. Also tell them if you smoke, drink alcohol, or use illegal drugs. Some items may interact with your medicine. What should I watch for while using this medicine? Your condition will   be monitored carefully while you are receiving this medicine. You will need important blood work done while you are taking this medicine. This medicine can cause serious allergic reactions. To reduce your risk you will need to take other medicine(s) before treatment with this medicine. If you experience allergic reactions like skin rash, itching or hives, swelling of the face, lips, or tongue, tell your doctor or health care professional right away. In some cases, you may be given additional medicines to help with  side effects. Follow all directions for their use. This drug may make you feel generally unwell. This is not uncommon, as chemotherapy can affect healthy cells as well as cancer cells. Report any side effects. Continue your course of treatment even though you feel ill unless your doctor tells you to stop. Call your doctor or health care professional for advice if you get a fever, chills or sore throat, or other symptoms of a cold or flu. Do not treat yourself. This drug decreases your body's ability to fight infections. Try to avoid being around people who are sick. This medicine may increase your risk to bruise or bleed. Call your doctor or health care professional if you notice any unusual bleeding. Be careful brushing and flossing your teeth or using a toothpick because you may get an infection or bleed more easily. If you have any dental work done, tell your dentist you are receiving this medicine. Avoid taking products that contain aspirin, acetaminophen, ibuprofen, naproxen, or ketoprofen unless instructed by your doctor. These medicines may hide a fever. Do not become pregnant while taking this medicine. Women should inform their doctor if they wish to become pregnant or think they might be pregnant. There is a potential for serious side effects to an unborn child. Talk to your health care professional or pharmacist for more information. Do not breast-feed an infant while taking this medicine. Men are advised not to father a child while receiving this medicine. This product may contain alcohol. Ask your pharmacist or healthcare provider if this medicine contains alcohol. Be sure to tell all healthcare providers you are taking this medicine. Certain medicines, like metronidazole and disulfiram, can cause an unpleasant reaction when taken with alcohol. The reaction includes flushing, headache, nausea, vomiting, sweating, and increased thirst. The reaction can last from 30 minutes to several hours. What  side effects may I notice from receiving this medicine? Side effects that you should report to your doctor or health care professional as soon as possible:  allergic reactions like skin rash, itching or hives, swelling of the face, lips, or tongue  breathing problems  changes in vision  fast, irregular heartbeat  high or low blood pressure  mouth sores  pain, tingling, numbness in the hands or feet  signs of decreased platelets or bleeding - bruising, pinpoint red spots on the skin, black, tarry stools, blood in the urine  signs of decreased red blood cells - unusually weak or tired, feeling faint or lightheaded, falls  signs of infection - fever or chills, cough, sore throat, pain or difficulty passing urine  signs and symptoms of liver injury like dark yellow or brown urine; general ill feeling or flu-like symptoms; light-colored stools; loss of appetite; nausea; right upper belly pain; unusually weak or tired; yellowing of the eyes or skin  swelling of the ankles, feet, hands  unusually slow heartbeat Side effects that usually do not require medical attention (report to your doctor or health care professional if they continue or are bothersome):    diarrhea  hair loss  loss of appetite  muscle or joint pain  nausea, vomiting  pain, redness, or irritation at site where injected  tiredness This list may not describe all possible side effects. Call your doctor for medical advice about side effects. You may report side effects to FDA at 1-800-FDA-1088. Where should I keep my medicine? This drug is given in a hospital or clinic and will not be stored at home. NOTE: This sheet is a summary. It may not cover all possible information. If you have questions about this medicine, talk to your doctor, pharmacist, or health care provider.  2021 Elsevier/Gold Standard (2019-08-23 13:37:23)   Carboplatin injection What is this medicine? CARBOPLATIN (KAR boe pla tin) is a  chemotherapy drug. It targets fast dividing cells, like cancer cells, and causes these cells to die. This medicine is used to treat ovarian cancer and many other cancers. This medicine may be used for other purposes; ask your health care provider or pharmacist if you have questions. COMMON BRAND NAME(S): Paraplatin What should I tell my health care provider before I take this medicine? They need to know if you have any of these conditions:  blood disorders  hearing problems  kidney disease  recent or ongoing radiation therapy  an unusual or allergic reaction to carboplatin, cisplatin, other chemotherapy, other medicines, foods, dyes, or preservatives  pregnant or trying to get pregnant  breast-feeding How should I use this medicine? This drug is usually given as an infusion into a vein. It is administered in a hospital or clinic by a specially trained health care professional. Talk to your pediatrician regarding the use of this medicine in children. Special care may be needed. Overdosage: If you think you have taken too much of this medicine contact a poison control center or emergency room at once. NOTE: This medicine is only for you. Do not share this medicine with others. What if I miss a dose? It is important not to miss a dose. Call your doctor or health care professional if you are unable to keep an appointment. What may interact with this medicine?  medicines for seizures  medicines to increase blood counts like filgrastim, pegfilgrastim, sargramostim  some antibiotics like amikacin, gentamicin, neomycin, streptomycin, tobramycin  vaccines Talk to your doctor or health care professional before taking any of these medicines:  acetaminophen  aspirin  ibuprofen  ketoprofen  naproxen This list may not describe all possible interactions. Give your health care provider a list of all the medicines, herbs, non-prescription drugs, or dietary supplements you use. Also tell  them if you smoke, drink alcohol, or use illegal drugs. Some items may interact with your medicine. What should I watch for while using this medicine? Your condition will be monitored carefully while you are receiving this medicine. You will need important blood work done while you are taking this medicine. This drug may make you feel generally unwell. This is not uncommon, as chemotherapy can affect healthy cells as well as cancer cells. Report any side effects. Continue your course of treatment even though you feel ill unless your doctor tells you to stop. In some cases, you may be given additional medicines to help with side effects. Follow all directions for their use. Call your doctor or health care professional for advice if you get a fever, chills or sore throat, or other symptoms of a cold or flu. Do not treat yourself. This drug decreases your body's ability to fight infections. Try to avoid being  around people who are sick. This medicine may increase your risk to bruise or bleed. Call your doctor or health care professional if you notice any unusual bleeding. Be careful brushing and flossing your teeth or using a toothpick because you may get an infection or bleed more easily. If you have any dental work done, tell your dentist you are receiving this medicine. Avoid taking products that contain aspirin, acetaminophen, ibuprofen, naproxen, or ketoprofen unless instructed by your doctor. These medicines may hide a fever. Do not become pregnant while taking this medicine. Women should inform their doctor if they wish to become pregnant or think they might be pregnant. There is a potential for serious side effects to an unborn child. Talk to your health care professional or pharmacist for more information. Do not breast-feed an infant while taking this medicine. What side effects may I notice from receiving this medicine? Side effects that you should report to your doctor or health care professional  as soon as possible:  allergic reactions like skin rash, itching or hives, swelling of the face, lips, or tongue  signs of infection - fever or chills, cough, sore throat, pain or difficulty passing urine  signs of decreased platelets or bleeding - bruising, pinpoint red spots on the skin, black, tarry stools, nosebleeds  signs of decreased red blood cells - unusually weak or tired, fainting spells, lightheadedness  breathing problems  changes in hearing  changes in vision  chest pain  high blood pressure  low blood counts - This drug may decrease the number of white blood cells, red blood cells and platelets. You may be at increased risk for infections and bleeding.  nausea and vomiting  pain, swelling, redness or irritation at the injection site  pain, tingling, numbness in the hands or feet  problems with balance, talking, walking  trouble passing urine or change in the amount of urine Side effects that usually do not require medical attention (report to your doctor or health care professional if they continue or are bothersome):  hair loss  loss of appetite  metallic taste in the mouth or changes in taste This list may not describe all possible side effects. Call your doctor for medical advice about side effects. You may report side effects to FDA at 1-800-FDA-1088. Where should I keep my medicine? This drug is given in a hospital or clinic and will not be stored at home. NOTE: This sheet is a summary. It may not cover all possible information. If you have questions about this medicine, talk to your doctor, pharmacist, or health care provider.  2021 Elsevier/Gold Standard (2007-12-27 14:38:05)   Doxorubicin injection What is this medicine? DOXORUBICIN (dox oh ROO bi sin) is a chemotherapy drug. It is used to treat many kinds of cancer like leukemia, lymphoma, neuroblastoma, sarcoma, and Wilms' tumor. It is also used to treat bladder cancer, breast cancer, lung  cancer, ovarian cancer, stomach cancer, and thyroid cancer. This medicine may be used for other purposes; ask your health care provider or pharmacist if you have questions. COMMON BRAND NAME(S): Adriamycin, Adriamycin PFS, Adriamycin RDF, Rubex What should I tell my health care provider before I take this medicine? They need to know if you have any of these conditions:  heart disease  history of low blood counts caused by a medicine  liver disease  recent or ongoing radiation therapy  an unusual or allergic reaction to doxorubicin, other chemotherapy agents, other medicines, foods, dyes, or preservatives  pregnant or trying to  get pregnant  breast-feeding How should I use this medicine? This drug is given as an infusion into a vein. It is administered in a hospital or clinic by a specially trained health care professional. If you have pain, swelling, burning or any unusual feeling around the site of your injection, tell your health care professional right away. Talk to your pediatrician regarding the use of this medicine in children. Special care may be needed. Overdosage: If you think you have taken too much of this medicine contact a poison control center or emergency room at once. NOTE: This medicine is only for you. Do not share this medicine with others. What if I miss a dose? It is important not to miss your dose. Call your doctor or health care professional if you are unable to keep an appointment. What may interact with this medicine? This medicine may interact with the following medications:  6-mercaptopurine  paclitaxel  phenytoin  St. John's Wort  trastuzumab  verapamil This list may not describe all possible interactions. Give your health care provider a list of all the medicines, herbs, non-prescription drugs, or dietary supplements you use. Also tell them if you smoke, drink alcohol, or use illegal drugs. Some items may interact with your medicine. What should I  watch for while using this medicine? This drug may make you feel generally unwell. This is not uncommon, as chemotherapy can affect healthy cells as well as cancer cells. Report any side effects. Continue your course of treatment even though you feel ill unless your doctor tells you to stop. There is a maximum amount of this medicine you should receive throughout your life. The amount depends on the medical condition being treated and your overall health. Your doctor will watch how much of this medicine you receive in your lifetime. Tell your doctor if you have taken this medicine before. You may need blood work done while you are taking this medicine. Your urine may turn red for a few days after your dose. This is not blood. If your urine is dark or brown, call your doctor. In some cases, you may be given additional medicines to help with side effects. Follow all directions for their use. Call your doctor or health care professional for advice if you get a fever, chills or sore throat, or other symptoms of a cold or flu. Do not treat yourself. This drug decreases your body's ability to fight infections. Try to avoid being around people who are sick. This medicine may increase your risk to bruise or bleed. Call your doctor or health care professional if you notice any unusual bleeding. Talk to your doctor about your risk of cancer. You may be more at risk for certain types of cancers if you take this medicine. Do not become pregnant while taking this medicine or for 6 months after stopping it. Women should inform their doctor if they wish to become pregnant or think they might be pregnant. Men should not father a child while taking this medicine and for 6 months after stopping it. There is a potential for serious side effects to an unborn child. Talk to your health care professional or pharmacist for more information. Do not breast-feed an infant while taking this medicine. This medicine has caused ovarian  failure in some women and reduced sperm counts in some men This medicine may interfere with the ability to have a child. Talk with your doctor or health care professional if you are concerned about your fertility. This medicine may  cause a decrease in Co-Enzyme Q-10. You should make sure that you get enough Co-Enzyme Q-10 while you are taking this medicine. Discuss the foods you eat and the vitamins you take with your health care professional. What side effects may I notice from receiving this medicine? Side effects that you should report to your doctor or health care professional as soon as possible:  allergic reactions like skin rash, itching or hives, swelling of the face, lips, or tongue  breathing problems  chest pain  fast or irregular heartbeat  low blood counts - this medicine may decrease the number of white blood cells, red blood cells and platelets. You may be at increased risk for infections and bleeding.  pain, redness, or irritation at site where injected  signs of infection - fever or chills, cough, sore throat, pain or difficulty passing urine  signs of decreased platelets or bleeding - bruising, pinpoint red spots on the skin, black, tarry stools, blood in the urine  swelling of the ankles, feet, hands  tiredness  weakness Side effects that usually do not require medical attention (report to your doctor or health care professional if they continue or are bothersome):  diarrhea  hair loss  mouth sores  nail discoloration or damage  nausea  red colored urine  vomiting This list may not describe all possible side effects. Call your doctor for medical advice about side effects. You may report side effects to FDA at 1-800-FDA-1088. Where should I keep my medicine? This drug is given in a hospital or clinic and will not be stored at home. NOTE: This sheet is a summary. It may not cover all possible information. If you have questions about this medicine, talk to  your doctor, pharmacist, or health care provider.  2021 [employer]/Gold Standard (2017-05-05 11:01:26) Cyclophosphamide Injection What is this medicine? CYCLOPHOSPHAMIDE (sye kloe FOSS fa mide) is a chemotherapy drug. It slows the growth of cancer cells. This medicine is used to treat many types of cancer like lymphoma, myeloma, leukemia, breast cancer, and ovarian cancer, to name a few. This medicine may be used for other purposes; ask your health care provider or pharmacist if you have questions. COMMON BRAND NAME(S): Cytoxan, Neosar What should I tell my health care provider before I take this medicine? They need to know if you have any of these conditions:  heart disease  history of irregular heartbeat  infection  kidney disease  liver disease  low blood counts, like white cells, platelets, or red blood cells  on hemodialysis  recent or ongoing radiation therapy  scarring or thickening of the lungs  trouble passing urine  an unusual or allergic reaction to cyclophosphamide, other medicines, foods, dyes, or preservatives  pregnant or trying to get pregnant  breast-feeding How should I use this medicine? This drug is usually given as an injection into a vein or muscle or by infusion into a vein. It is administered in a hospital or clinic by a specially trained health care professional. Talk to your pediatrician regarding the use of this medicine in children. Special care may be needed. Overdosage: If you think you have taken too much of this medicine contact a poison control center or emergency room at once. NOTE: This medicine is only for you. Do not share this medicine with others. What if I miss a dose? It is important not to miss your dose. Call your doctor or health care professional if you are unable to keep an appointment. What may interact with this  medicine?  amphotericin B  azathioprine  certain antivirals for HIV or hepatitis  certain medicines for blood  pressure, heart disease, irregular heart beat  certain medicines that treat or prevent blood clots like warfarin  certain other medicines for cancer  cyclosporine  etanercept  indomethacin  medicines that relax muscles for surgery  medicines to increase blood counts  metronidazole This list may not describe all possible interactions. Give your health care provider a list of all the medicines, herbs, non-prescription drugs, or dietary supplements you use. Also tell them if you smoke, drink alcohol, or use illegal drugs. Some items may interact with your medicine. What should I watch for while using this medicine? Your condition will be monitored carefully while you are receiving this medicine. You may need blood work done while you are taking this medicine. Drink water or other fluids as directed. Urinate often, even at night. Some products may contain alcohol. Ask your health care professional if this medicine contains alcohol. Be sure to tell all health care professionals you are taking this medicine. Certain medicines, like metronidazole and disulfiram, can cause an unpleasant reaction when taken with alcohol. The reaction includes flushing, headache, nausea, vomiting, sweating, and increased thirst. The reaction can last from 30 minutes to several hours. Do not become pregnant while taking this medicine or for 1 year after stopping it. Women should inform their health care professional if they wish to become pregnant or think they might be pregnant. Men should not father a child while taking this medicine and for 4 months after stopping it. There is potential for serious side effects to an unborn child. Talk to your health care professional for more information. Do not breast-feed an infant while taking this medicine or for 1 week after stopping it. This medicine has caused ovarian failure in some women. This medicine may make it more difficult to get pregnant. Talk to your health care  professional if you are concerned about your fertility. This medicine has caused decreased sperm counts in some men. This may make it more difficult to father a child. Talk to your health care professional if you are concerned about your fertility. Call your health care professional for advice if you get a fever, chills, or sore throat, or other symptoms of a cold or flu. Do not treat yourself. This medicine decreases your body's ability to fight infections. Try to avoid being around people who are sick. Avoid taking medicines that contain aspirin, acetaminophen, ibuprofen, naproxen, or ketoprofen unless instructed by your health care professional. These medicines may hide a fever. Talk to your health care professional about your risk of cancer. You may be more at risk for certain types of cancer if you take this medicine. If you are going to need surgery or other procedure, tell your health care professional that you are using this medicine. Be careful brushing or flossing your teeth or using a toothpick because you may get an infection or bleed more easily. If you have any dental work done, tell your dentist you are receiving this medicine. What side effects may I notice from receiving this medicine? Side effects that you should report to your doctor or health care professional as soon as possible:  allergic reactions like skin rash, itching or hives, swelling of the face, lips, or tongue  breathing problems  nausea, vomiting  signs and symptoms of bleeding such as bloody or black, tarry stools; red or dark brown urine; spitting up blood or brown material that looks  like coffee grounds; red spots on the skin; unusual bruising or bleeding from the eyes, gums, or nose  signs and symptoms of heart failure like fast, irregular heartbeat, sudden weight gain; swelling of the ankles, feet, hands  signs and symptoms of infection like fever; chills; cough; sore throat; pain or trouble passing  urine  signs and symptoms of kidney injury like trouble passing urine or change in the amount of urine  signs and symptoms of liver injury like dark yellow or brown urine; general ill feeling or flu-like symptoms; light-colored stools; loss of appetite; nausea; right upper belly pain; unusually weak or tired; yellowing of the eyes or skin Side effects that usually do not require medical attention (report to your doctor or health care professional if they continue or are bothersome):  confusion  decreased hearing  diarrhea  facial flushing  hair loss  headache  loss of appetite  missed menstrual periods  signs and symptoms of low red blood cells or anemia such as unusually weak or tired; feeling faint or lightheaded; falls  skin discoloration This list may not describe all possible side effects. Call your doctor for medical advice about side effects. You may report side effects to FDA at 1-800-FDA-1088. Where should I keep my medicine? This drug is given in a hospital or clinic and will not be stored at home. NOTE: This sheet is a summary. It may not cover all possible information. If you have questions about this medicine, talk to your doctor, pharmacist, or health care provider.  2021 [employer] Standard (2019-06-26 09:53:29)

## 2020-12-13 NOTE — Op Note (Signed)
SURGICAL PROCEDURE REPORT  DATE OF PROCEDURE: 12/13/2020   SURGEON: Dr. Windell Moment   ANESTHESIA: Local with light IV sedation   PRE-OPERATIVE DIAGNOSIS: Advanced breast cancer requiring durable central venous access for chemotherapy   POST-OPERATIVE DIAGNOSIS: Advanced breast cancer requiring durable central venous access for chemotherapy   PROCEDURE(S):  1.) Percutaneous access of Right internal jugular vein under ultrasound guidance 2.) Insertion of tunneled Right internal jugular central venous catheter with subcutaneous port  INTRAOPERATIVE FINDINGS: Patent easily compressible Right internal jugular vein with appropriate respiratory variations and well-secured tunneled central venous catheter with subcutaneous port at completion of the procedure  ESTIMATED BLOOD LOSS: Minimal (<20 mL)   SPECIMENS: None   IMPLANTS: 74F tunneled Bard PowerPort central venous catheter with subcutaneous port  DRAINS: None   COMPLICATIONS: None apparent   CONDITION AT COMPLETION: Hemodynamically stable, awake   DISPOSITION: PACU   INDICATION(S) FOR PROCEDURE:  Patient is a 30 y.o. female who presented with advanced breast cancer requiring durable central venous access for chemotherapy. All risks, benefits, and alternatives to above elective procedures were discussed with the patient, who elected to proceed, and informed consent was accordingly obtained at that time.  DETAILS OF PROCEDURE:  Patient was brought to the operative suite and appropriately identified. In Trendelenburg position, Right IJ venous access site was prepped and draped in the usual sterile fashion, and following a brief timeout, percutaneous Right IJ venous access was obtained under ultrasound guidance using Seldinger technique, by which local anesthetic was injected over the Right IJ vein, and access needle was inserted under direct ultrasound visualization into the Right IJ vein, through which soft guidewire was advanced,  over which access needle was withdrawn. Guidewire was secured, attention was directed to injection of local anesthetic along the planned tunnel site, 2-3 cm transverse Right chest incision was made and confirmed to accommodate the subcutaneous port, and flushed catheter was tunneled retrograde from the port site over the Right chest to the Right IJ access site with the attached port well-secured to the catheter and within the subcutaneous pocket. Insertion sheath was advanced over the guidewire, which was withdrawn along with the insertion sheath dilator. The catheter was introduced through the sheath and left on the [place] junction under fluoro guidance and catheter cut to desire lenght. Catheter connected to port and fixed to the pocket on two side to avoid twisting. Port was confirmed to withdraw blood and flush easily, after which concentrated heparin was instilled into the port and catheter. Dermis at the subcutaneous pocket was re-approximated using buried interrupted 3-0 Vicryl suture, and 4-0 Monocryl suture was used to re-approximate skin at the insertion and subcutaneous port sites in running subcuticular fashion for the subcutaneous port and buried interrupted fashion for the insertion site. Skin was cleaned, dried, and sterile skin glue was applied. Patient was then safely transferred to PACU for a chest x-ray. Ultrasound images are available on paper chart and Fluoroscopy guidance images are available in Epic.

## 2020-12-13 NOTE — Transfer of Care (Signed)
Immediate Anesthesia Transfer of Care Note  Patient: Vickie Mathis  Procedure(s) Performed: INSERTION PORT-A-CATH (Right Chest)  Patient Location: PACU  Anesthesia Type:General  Level of Consciousness: drowsy  Airway & Oxygen Therapy: Patient Spontanous Breathing  Post-op Assessment: Report given to RN and Post -op Vital signs reviewed and stable  Post vital signs: Reviewed and stable  Last Vitals:  Vitals Value Taken Time  BP 105/72 12/13/20 1047  Temp 36.2 C 12/13/20 1047  Pulse 49 12/13/20 1051  Resp 19 12/13/20 1051  SpO2 100 % 12/13/20 1051  Vitals shown include unvalidated device data.  Last Pain:  Vitals:   12/13/20 0716  TempSrc: Temporal  PainSc: 0-No pain         Complications: No complications documented.

## 2020-12-13 NOTE — Progress Notes (Signed)
   12/13/20 0750  Clinical Encounter Type  Visited With Patient  Visit Type Initial;Psychological support;Spiritual support;Social support  Referral From Chaplain  Consult/Referral To [facility] visited Ms. Avans while doing rounds. PT was able to express her emotions. Chaplain supported PT with spiritual and emotional support, and was a calming presence.

## 2020-12-13 NOTE — Anesthesia Preprocedure Evaluation (Addendum)
Anesthesia Evaluation   Patient awake    Reviewed: Allergy & Precautions, NPO status , Patient's Chart, lab work & pertinent test results  History of Anesthesia Complications Negative for: history of anesthetic complications  Airway Mallampati: II       Dental   Pulmonary asthma (no inhalers in months, exercise induced) , neg sleep apnea, neg COPD, Not current smoker,           Cardiovascular (-) hypertension(-) Past MI and (-) CHF (-) dysrhythmias (-) Valvular Problems/Murmurs     Neuro/Psych neg Seizures Anxiety Depression    GI/Hepatic Neg liver ROS, neg GERD  ,  Endo/Other  neg diabetes  Renal/GU negative Renal ROS     Musculoskeletal   Abdominal   Peds  Hematology   Anesthesia Other Findings   Reproductive/Obstetrics                            Anesthesia Physical Anesthesia Plan  ASA: II  Anesthesia Plan: General   Post-op Pain Management:    Induction: Intravenous  PONV Risk Score and Plan: 3 and Propofol infusion and TIVA  Airway Management Planned:   Additional Equipment:   Intra-op Plan:   Post-operative Plan:   Informed Consent: I have reviewed the patients History and Physical, chart, labs and discussed the procedure including the risks, benefits and alternatives for the proposed anesthesia with the patient or authorized representative who has indicated his/her understanding and acceptance.       Plan Discussed with:   Anesthesia Plan Comments:         Anesthesia Quick Evaluation

## 2020-12-16 ENCOUNTER — Ambulatory Visit
Admission: RE | Admit: 2020-12-16 | Discharge: 2020-12-16 | Disposition: A | Discharge status: Home / Self Care | Payer: 59 | Source: Ambulatory Visit | Attending: Oncology | Admitting: Oncology

## 2020-12-16 ENCOUNTER — Ambulatory Visit
Admission: RE | Admit: 2020-12-16 | Discharge: 2020-12-16 | Disposition: A | Discharge status: Home / Self Care | Payer: 59 | Source: Ambulatory Visit | Attending: Nurse Practitioner | Admitting: Nurse Practitioner

## 2020-12-16 ENCOUNTER — Other Ambulatory Visit: Payer: Self-pay | Admitting: *Deleted

## 2020-12-16 ENCOUNTER — Other Ambulatory Visit: Payer: Self-pay | Admitting: Oncology

## 2020-12-16 ENCOUNTER — Other Ambulatory Visit: Payer: Self-pay

## 2020-12-16 ENCOUNTER — Inpatient Hospital Stay: Payer: 59

## 2020-12-16 ENCOUNTER — Ambulatory Visit: Payer: 59

## 2020-12-16 DIAGNOSIS — N6310 Unspecified lump in the right breast, unspecified quadrant: Secondary | ICD-10-CM | POA: Insufficient documentation

## 2020-12-16 DIAGNOSIS — C50919 Malignant neoplasm of unspecified site of unspecified female breast: Secondary | ICD-10-CM | POA: Diagnosis present

## 2020-12-16 IMAGING — MR MR BREAST BX W/ LOC DEV 1ST LEASION IMAGE BX SPEC MR GUIDE*R*
9 of 13 series · 32 of 48 positions shown · IV contrast (gadavist)
Comparison: Previous exams.
COMPARISON: Previous exams.

Addendum:
CLINICAL DATA: 29-year-old female for tissue sampling of 4.1 cm
area of UPPER-OUTER RIGHT breast non masslike enhancement. Recent
diagnosis of LEFT breast cancer.

EXAM:
MRI GUIDED CORE NEEDLE BIOPSY OF THE RIGHT BREAST
TECHNIQUE: Multiplanar, multisequence MR imaging of the RIGHT breast was
performed both before and after administration of intravenous
contrast.
CONTRAST:  8mL GADAVIST GADOBUTROL 1 MMOL/ML IV SOLN

[Series 3: fiducial unilateral · sagittal · 2.0mm · 1.33mm/px · 2 of 52 slices shown]
[im 1/52]
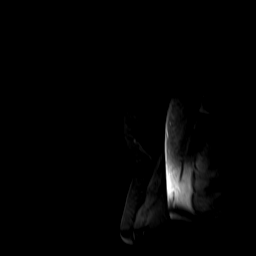
[im 52/52]
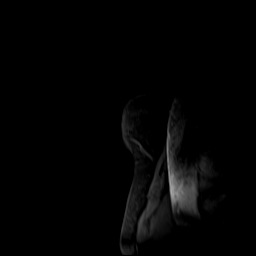

[Series 6: fiducial two · sagittal · 2.0mm · 1.33mm/px · 2 of 52 slices shown]
[im 1/52]
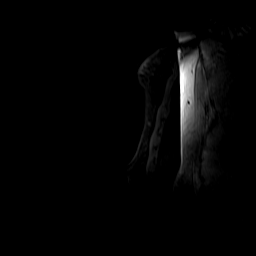
[im 52/52]
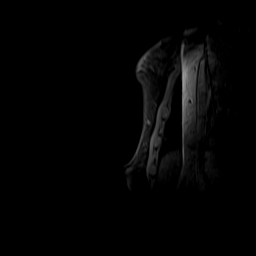

[Series 7: dynamic pre two · axial · non-contrast · 1.3mm · 0.73mm/px · z∈[-94,+134]mm · 4 of 176 slices shown]
[im 1/176]
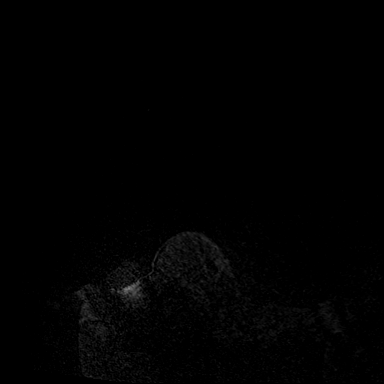
[im 59/176]
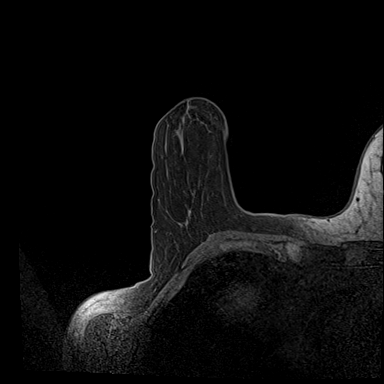
[im 117/176]
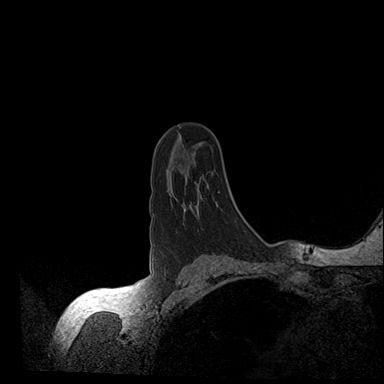
[im 176/176]
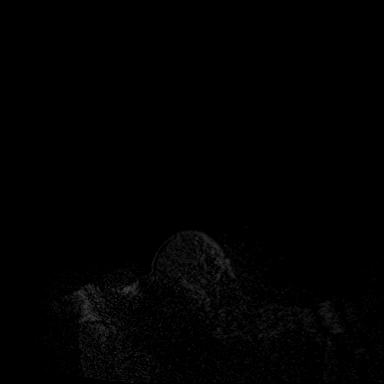

[Series 8: dynamic post 20 · axial · 1.3mm · 0.73mm/px · z∈[-94,+134]mm · 4 of 176 slices shown (1 of 2)]
[im 1/176]
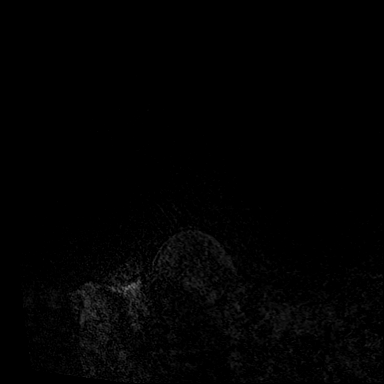
[im 59/176]
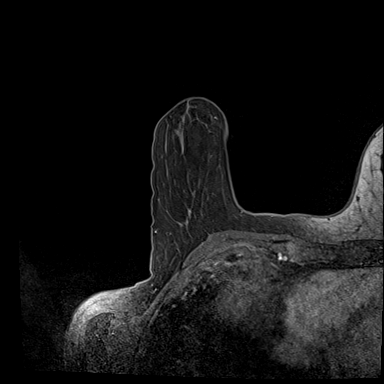
[im 117/176]
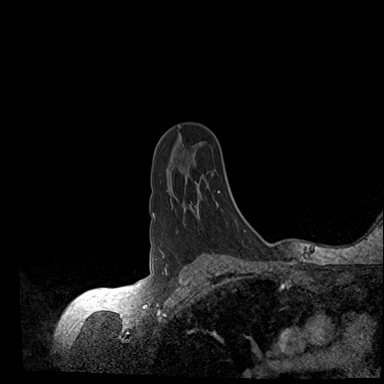
[im 176/176]
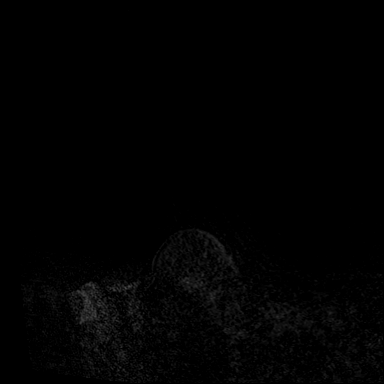

[Series 9: dynamic post 20 · axial · 1.3mm · 0.73mm/px · z∈[-94,+134]mm · 4 of 176 slices shown (2 of 2)]
[im 1/176]
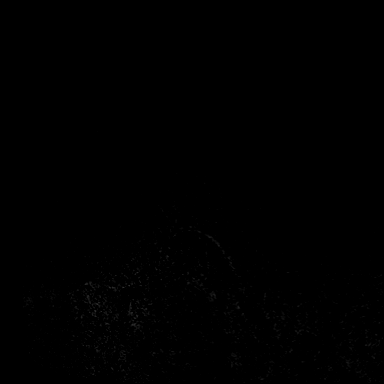
[im 59/176]
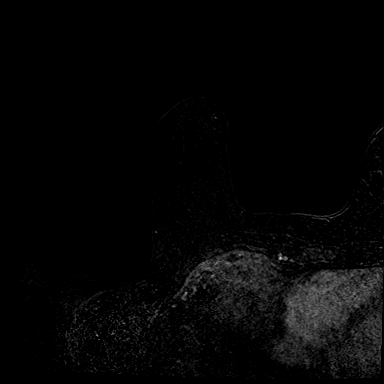
[im 117/176]
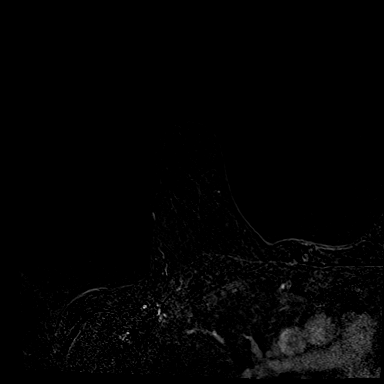
[im 176/176]
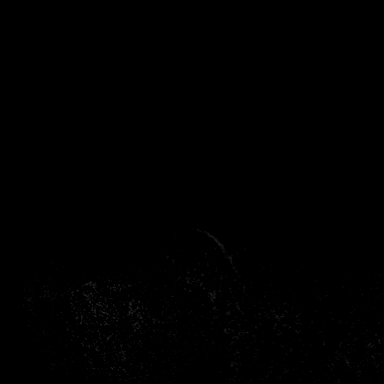

[Series 10: dynamic post 3 · axial · 1.3mm · 0.73mm/px · z∈[-94,+134]mm · 4 of 176 slices shown (1 of 2)]
[im 1/176]
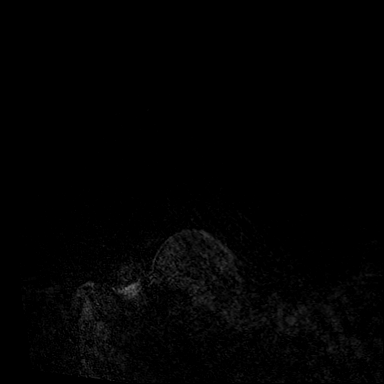
[im 59/176]
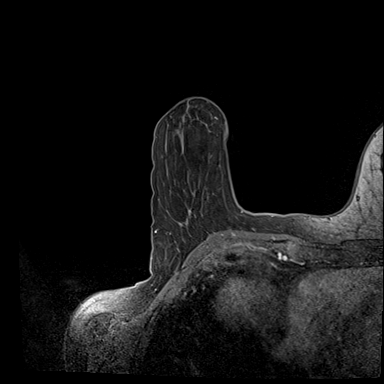
[im 117/176]
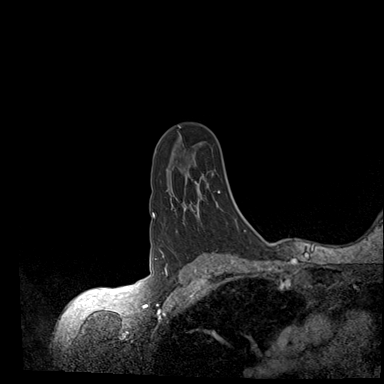
[im 176/176]
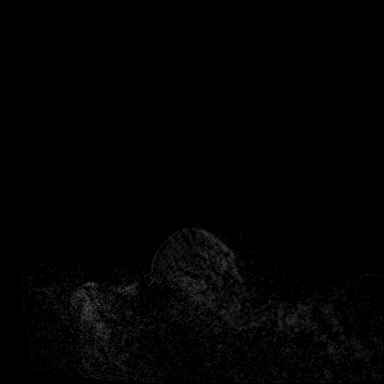

[Series 11: dynamic post 3 · axial · 1.3mm · 0.73mm/px · z∈[-94,+134]mm · 4 of 176 slices shown (2 of 2)]
[im 1/176]
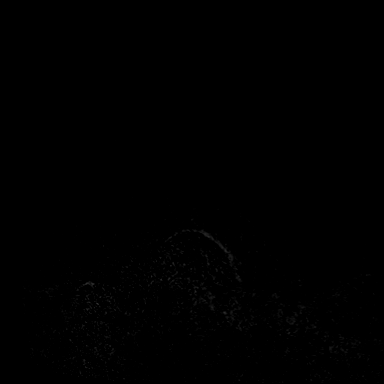
[im 59/176]
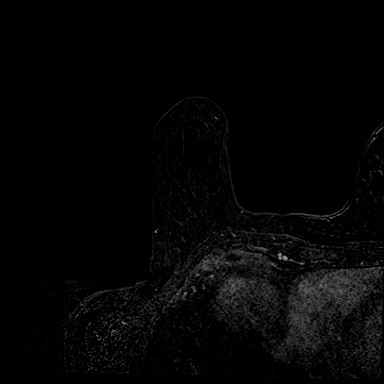
[im 117/176]
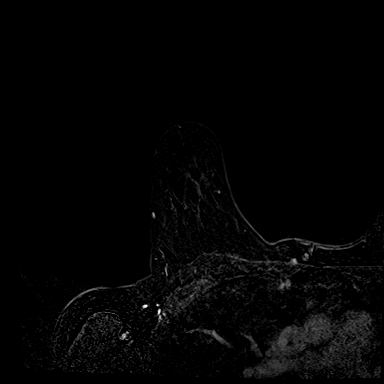
[im 176/176]
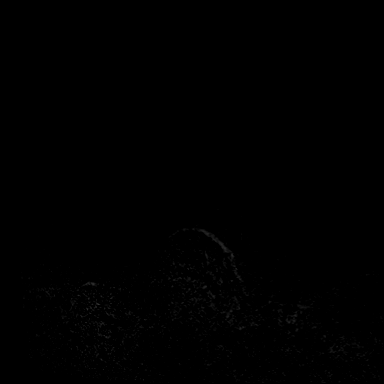

[Series 12: dynamic post 5 · axial · 1.3mm · 0.73mm/px · z∈[-94,+134]mm · 4 of 176 slices shown (1 of 2)]
[im 1/176]
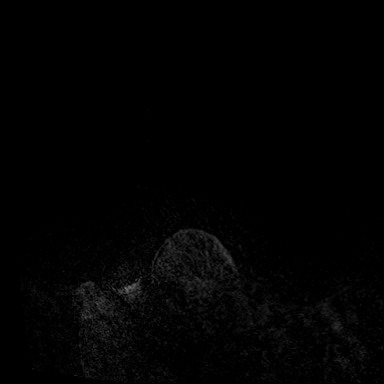
[im 59/176]
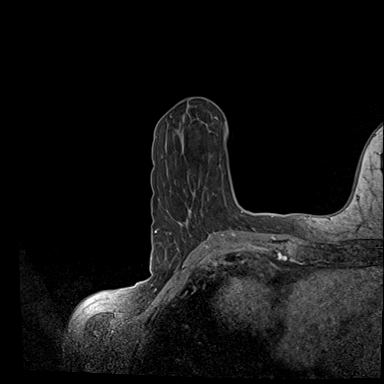
[im 117/176]
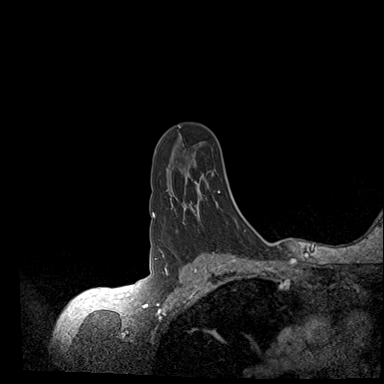
[im 176/176]
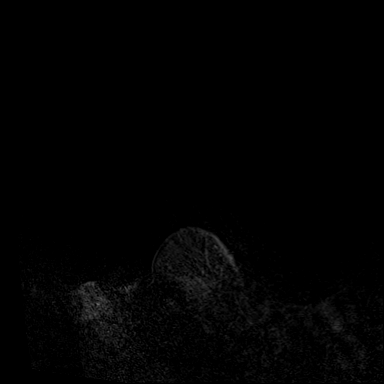

[Series 13: dynamic post 5 · axial · 1.3mm · 0.73mm/px · z∈[-94,+134]mm · 4 of 176 slices shown (2 of 2)]
[im 1/176]
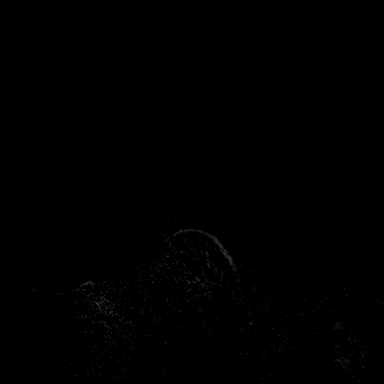
[im 59/176]
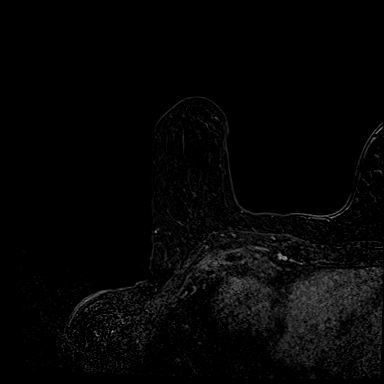
[im 117/176]
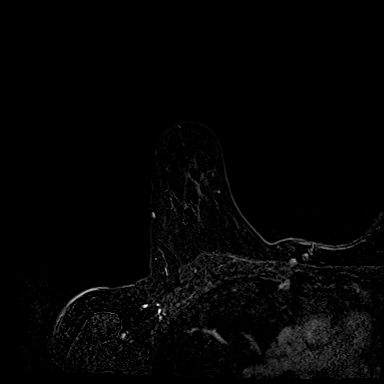
[im 176/176]
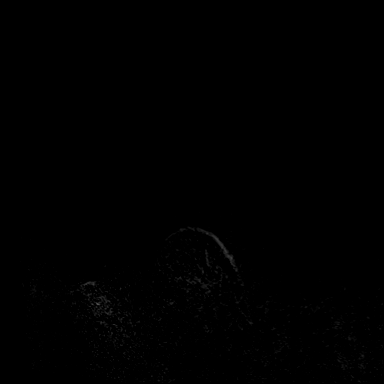

[32 of 48 positions shown; findings below may reference images not displayed]

FINDINGS: I met with the patient, and we discussed the procedure of MRI guided
biopsy, including risks, benefits, and alternatives. Specifically,
we discussed the risks of infection, bleeding, tissue injury, clip
migration, and inadequate sampling. Informed, written consent was
given. The usual time out protocol was performed immediately prior
to the procedure.

Using sterile technique, 1% Lidocaine, MRI guidance, and a 9 gauge
vacuum assisted device, biopsy was performed of the posterior
UPPER-OUTER RIGHT breast non masslike enhancement using a LATERAL
approach. At the conclusion of the procedure, a BARBELL tissue
marker clip was deployed into the biopsy cavity. Follow-up 2-view
mammogram was performed and dictated separately.
IMPRESSION: MRI guided biopsy of RIGHT breast biopsy. No apparent complications.

The patient states she is unsure if she will undergo LEFT
mastectomy, and there are 2 other LEFT breast areas on recent breast
MR that were recommended to be sampled if breast conservation is
desired. Only the RIGHT breast was sampled today as we only had an
order for the MR guided RIGHT breast biopsy. If LEFT breast
conservation will be considered, then MR guided biopsies of the 2
other areas in the LEFT breast is recommended (as on prior report).

ADDENDUM:
Pathology revealed BENIGN BREAST TISSUE of the RIGHT breast, outer.
This was found to be concordant by Dr. AMAZIGH.

Pathology results were discussed with the patient by telephone. The
patient reported doing well after the biopsy with tenderness and
bleeding at the site. Post biopsy instructions and care were
reviewed and questions were answered. The patient was encouraged to
call The [REDACTED] for any additional
concerns. My direct phone number was provided.

The patient has a recent diagnosis of LEFT breast cancer and should
follow her outlined treatment plan.

If breast conservation is being considered, recommendation for MR
guided core needle biopsy of the 2.6 cm area of linear, clumped non
mass enhancement in the posterior aspect of the lower outer quadrant
of the LEFT breast, and

MR guided core needle biopsy of the anterior extent of the 2.3 cm
linear component extending anteriorly from the biopsy proven
invasive mammary carcinoma in the 12 o'clock position of the LEFT
breast.

Dr. AMAZIGH and AMAZIGH, RN Oncology Navigator with [REDACTED] were notified of biopsy results and
recommendations via [REDACTED] message on [DATE].

Pathology results reported by AMAZIGH, RN on [DATE].

*** End of Addendum ***
FINDINGS: I met with the patient, and we discussed the procedure of MRI guided
biopsy, including risks, benefits, and alternatives. Specifically,
we discussed the risks of infection, bleeding, tissue injury, clip
migration, and inadequate sampling. Informed, written consent was
given. The usual time out protocol was performed immediately prior
to the procedure.

Using sterile technique, 1% Lidocaine, MRI guidance, and a 9 gauge
vacuum assisted device, biopsy was performed of the posterior
UPPER-OUTER RIGHT breast non masslike enhancement using a LATERAL
approach. At the conclusion of the procedure, a BARBELL tissue
marker clip was deployed into the biopsy cavity. Follow-up 2-view
mammogram was performed and dictated separately.
IMPRESSION: MRI guided biopsy of RIGHT breast biopsy. No apparent complications.

The patient states she is unsure if she will undergo LEFT
mastectomy, and there are 2 other LEFT breast areas on recent breast
MR that were recommended to be sampled if breast conservation is
desired. Only the RIGHT breast was sampled today as we only had an
order for the MR guided RIGHT breast biopsy. If LEFT breast
conservation will be considered, then MR guided biopsies of the 2
other areas in the LEFT breast is recommended (as on prior report).

## 2020-12-16 IMAGING — CT CT CHEST-ABD-PELV W/ CM
2 of 4 series · 14 of 36 positions shown, 16 images · IV contrast (omnipaque)
Comparison: MRI breast [DATE]

CLINICAL DATA: Initial staging of left breast cancer

EXAM:
CT CHEST, ABDOMEN, AND PELVIS WITH CONTRAST
TECHNIQUE: Multidetector CT imaging of the chest, abdomen and pelvis was
performed following the standard protocol during bolus
administration of intravenous contrast.
CONTRAST:  100mL OMNIPAQUE IOHEXOL 300 MG/ML  SOLN

[Series 2: cap with · axial · 0.79mm/px · z∈[-669,-69]mm · 11 of 145 slices shown, 13 images]
[im 13/145  mediastinal]
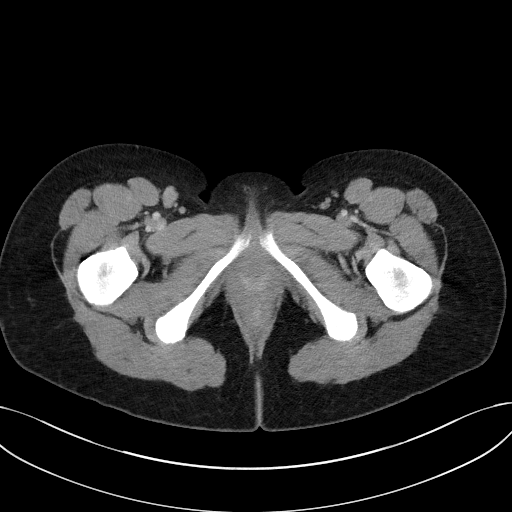
[im 13/145  bone]
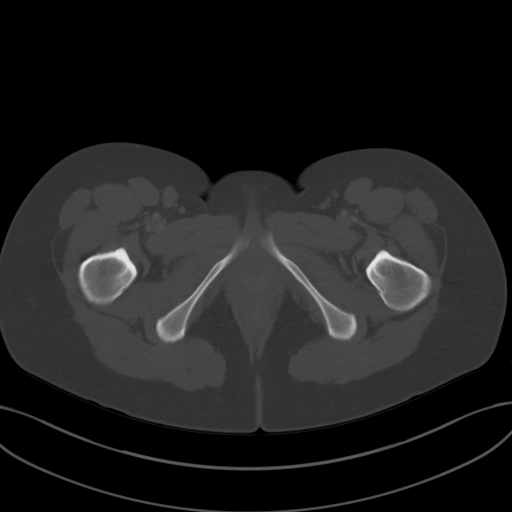
[im 25/145  mediastinal]
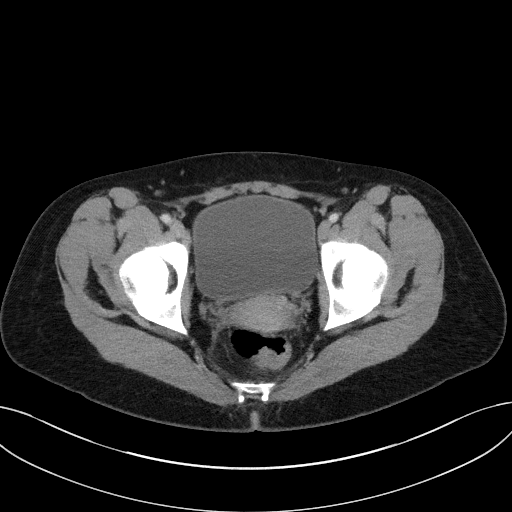
[im 37/145  mediastinal]
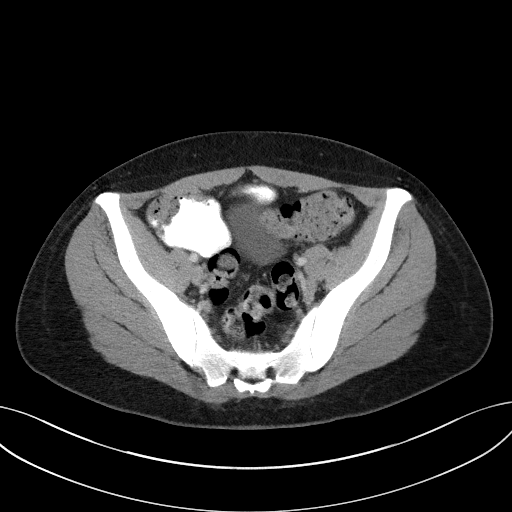
[im 49/145  mediastinal]
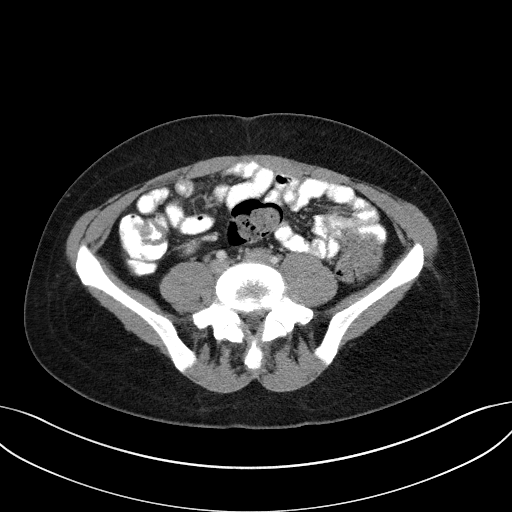
[im 61/145  mediastinal]
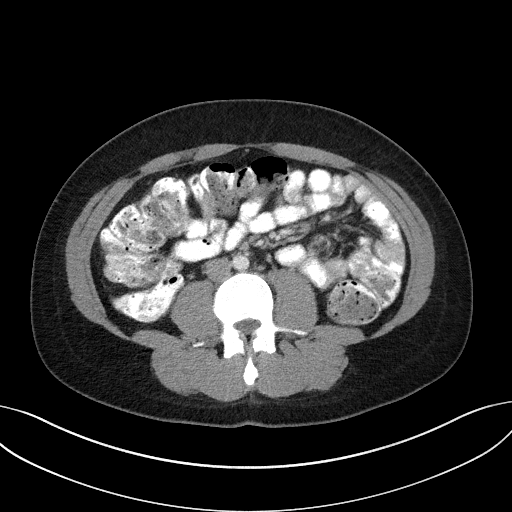
[im 73/145  mediastinal]
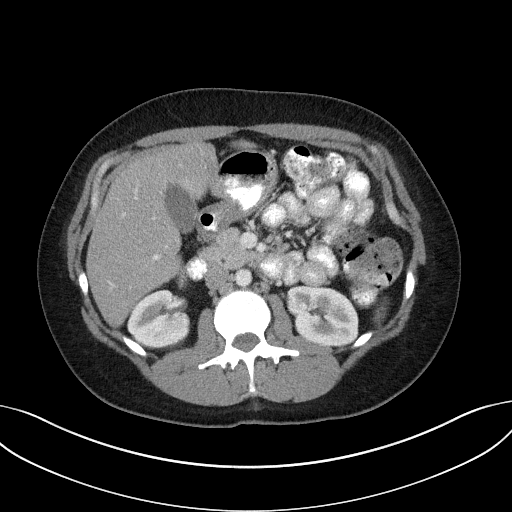
[im 85/145  mediastinal]
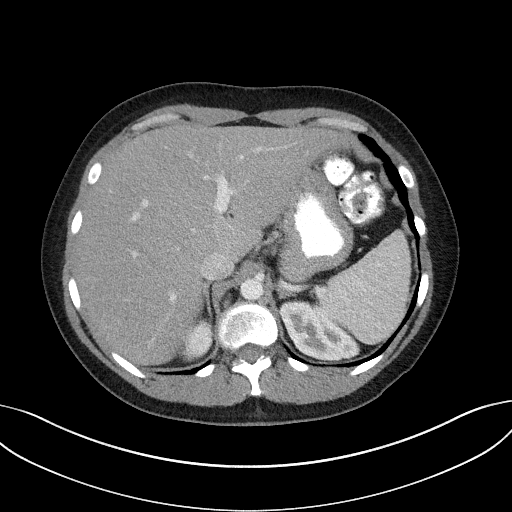
[im 97/145  mediastinal]
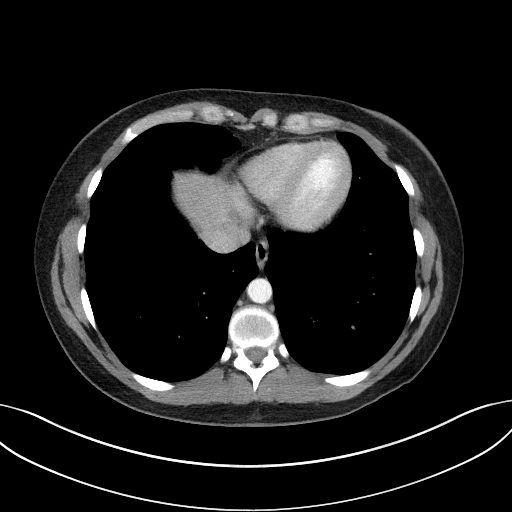
[im 109/145  mediastinal]
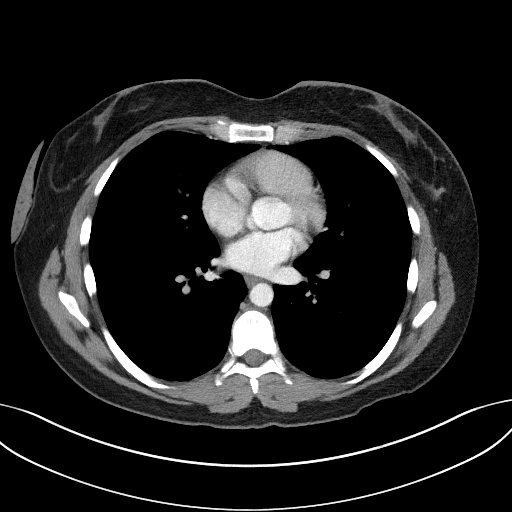
[im 109/145  bone]
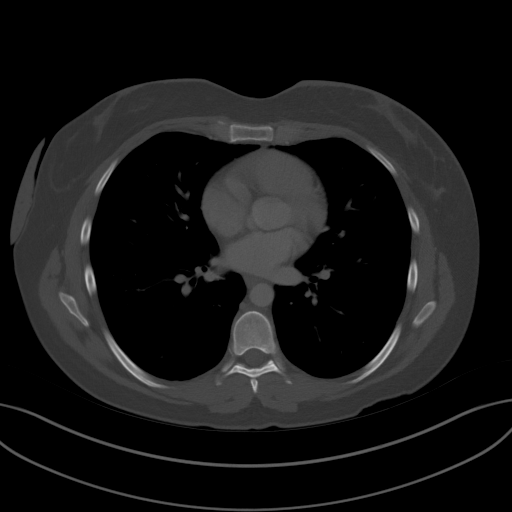
[im 121/145  mediastinal]
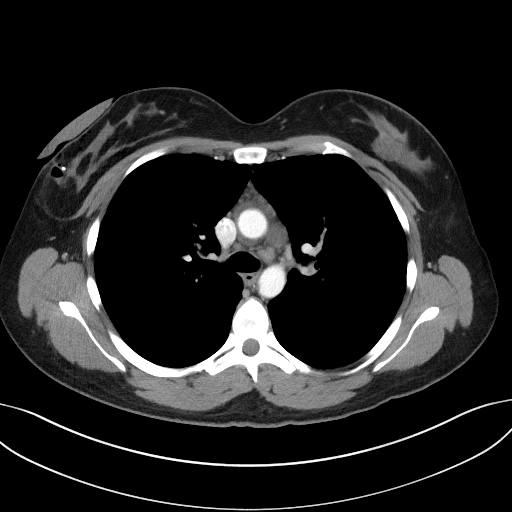
[im 133/145  mediastinal]
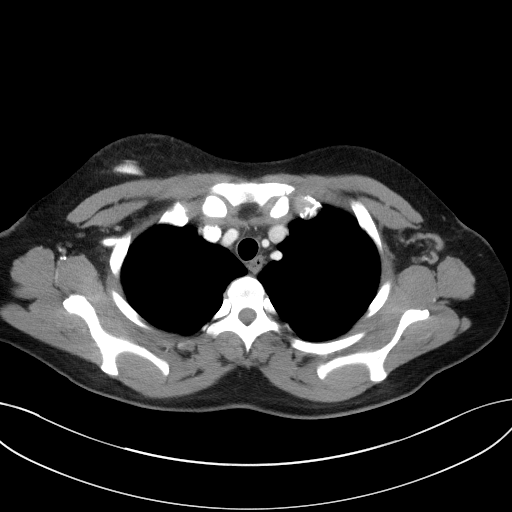

[Series 5: coronals · coronal · 0.97mm/px · 3 of 135 slices shown]
[im 27/135  mediastinal]
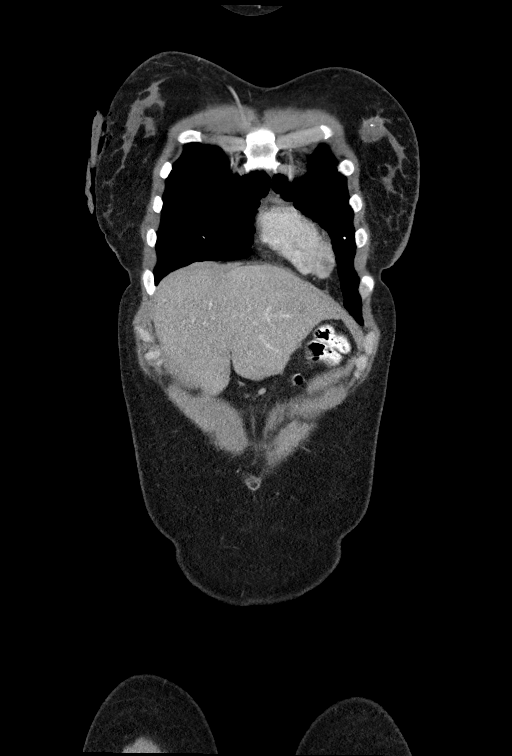
[im 54/135  mediastinal]
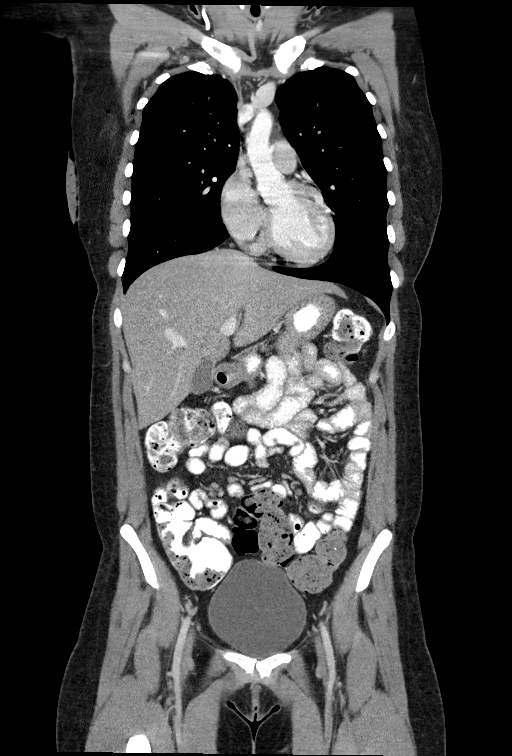
[im 81/135  mediastinal]
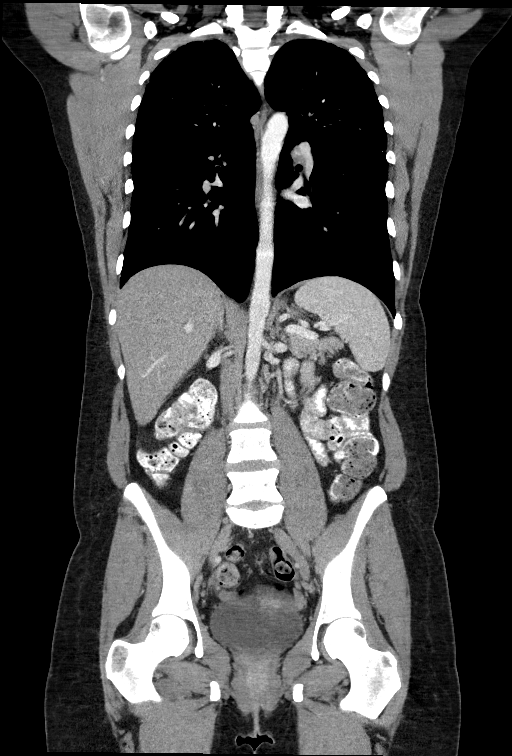

[14 of 36 positions shown; findings below may reference images not displayed]

FINDINGS: CT CHEST FINDINGS

Cardiovascular: Right Port-A-Cath tip: SVC.

Mediastinum/Nodes: Left axillary node with parenchymal thickness of
0.6 cm noted (1.1 cm if the fatty hilum is included), image 11
series 2. On image 14 of series 2, a lymph node with parenchymal
thickness of 0.6 cm is observed (1.0 cm if the fatty hilum is
included). Right axillary node with parenchymal thickness is 0.4 cm
(0.8 cm if the fatty hilum is included), image 15 series 2.

Right paratracheal lymph node 0.6 cm short axis on image 22 series
2, within normal size limits. Small distal paraesophageal and
periaortic lymph nodes are not pathologically enlarged this time.
Left lower paratracheal lymph node 0.7 cm in short axis.

Lungs/Pleura: Mild biapical pleuroparenchymal scarring.

2 mm calcified right lower lobe pulmonary nodule on image 73 series
4, likely a granuloma. Similar 0.3 cm superior segment right lower
lobe nodule on image 62 series 4, calcified and likely a chronic
benign granuloma. Small granulomas are present in the superior
segment left lower lobe (image 56 series 4) and in the left upper
lobe (image 65, series 4).

Non-calcified nodules: 0.5 by 0.2 cm left upper lobe nodule on image
40 series [DATE] cm left lower lobe basilar nodule on image 132
series 4.

Musculoskeletal: 0.6 by 0.4 cm lucent lesion medially in the left
first rib shown on image 111 series 6 and image 13 series 4. This
might be a hemangioma or similar benign lesion, but the lesion is
essentially nonspecific primarily due to small size.

Small locules of gas along the right lateral breast likely from
recent biopsy. A overlying compresses in place.

There is a clip along a left upper breast mass.

CT ABDOMEN PELVIS FINDINGS

Hepatobiliary: Unremarkable

Pancreas: Unremarkable

Spleen: Unremarkable

Adrenals/Urinary Tract: Unremarkable

Stomach/Bowel: Prominent stool throughout the colon favors
constipation.

Vascular/Lymphatic: Right inguinal node 1.1 cm in short axis on
image 134 series 2. Separate right inguinal node 1.2 cm in short
axis, image 137 series 2. These are not overtly pathologically
enlarged for this location but are somewhat asymmetric compared to
the contralateral side. Right external iliac node 0.7 cm in short
axis, image 122 series 2. Small mesenteric lymph nodes are present.

Reproductive: Unremarkable

Other: No supplemental non-categorized findings.

Musculoskeletal: Suspected mild disc bulge L5-S1.
IMPRESSION: 1. Mildly enlarged right inguinal lymph nodes, nonspecific. Left
upper breast mass, with minimal asymmetric prominence of left
axillary lymph nodes compared to the right, although not overtly
pathologically enlarged by size criteria.
2. Upper normal sized and slightly asymmetric right inguinal lymph
nodes, surveillance suggested.
3. Small locules of gas along the right lateral breast likely from
recent biopsy.
4. Several tiny pulmonary nodules are present. Most of these are
calcified compatible with benign old granulomatous disease. A couple
are noncalcified and merit surveillance.
5. 0.6 by 0.4 cm lucent lesion medially in the left first rib,
probably a hemangioma or similar benign lesion, but the lesion is
essentially nonspecific primarily due to small size and merits
surveillance.
6. Prominent stool throughout the colon favors constipation.
7. Suspected mild disc bulge L5-S1.

## 2020-12-16 IMAGING — MG MM BREAST LOCALIZATION CLIP
4 series · 4 of 12 positions shown · non-contrast
Comparison: Previous exam(s).

CLINICAL DATA: Evaluate BARBELL clip placement following MR guided
RIGHT breast biopsy.

EXAM:
DIAGNOSTIC RIGHT MAMMOGRAM POST MRI BIOPSY

[R ML synth-2D]
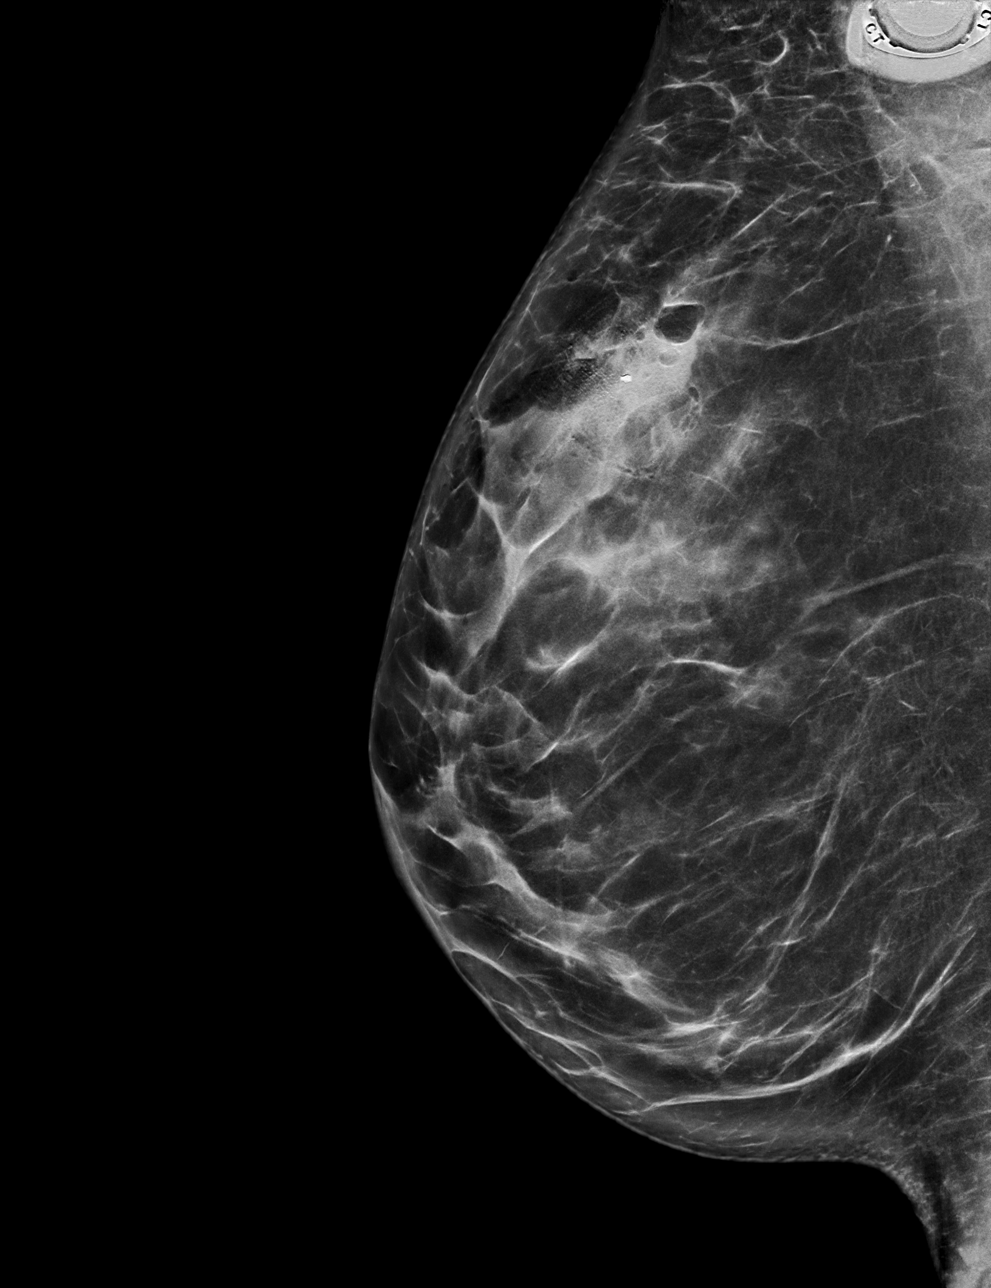

[R CC synth-2D]
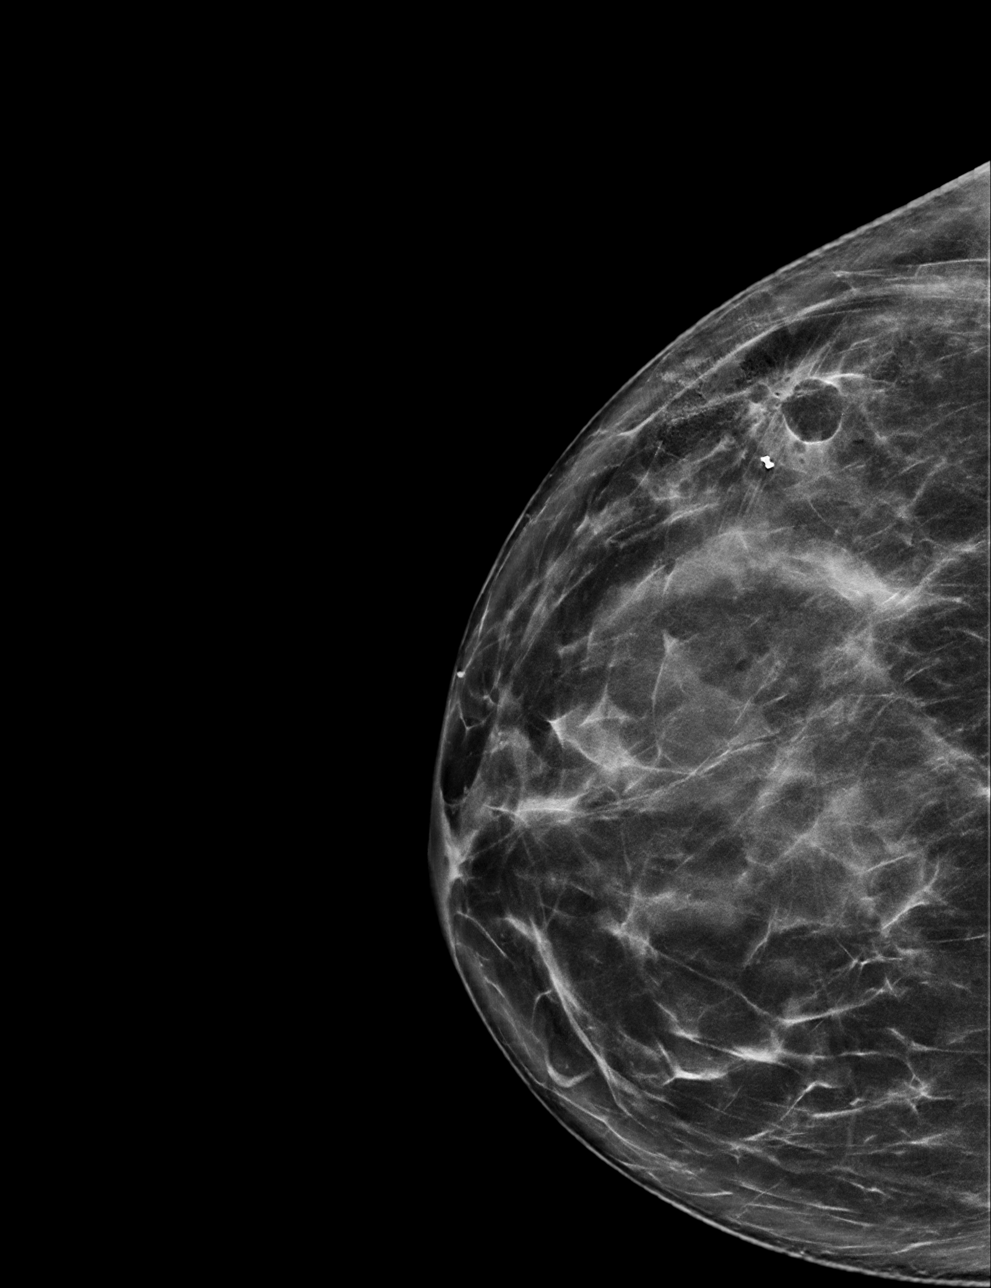

[R CC tomo · tomo slice 39/76.0]
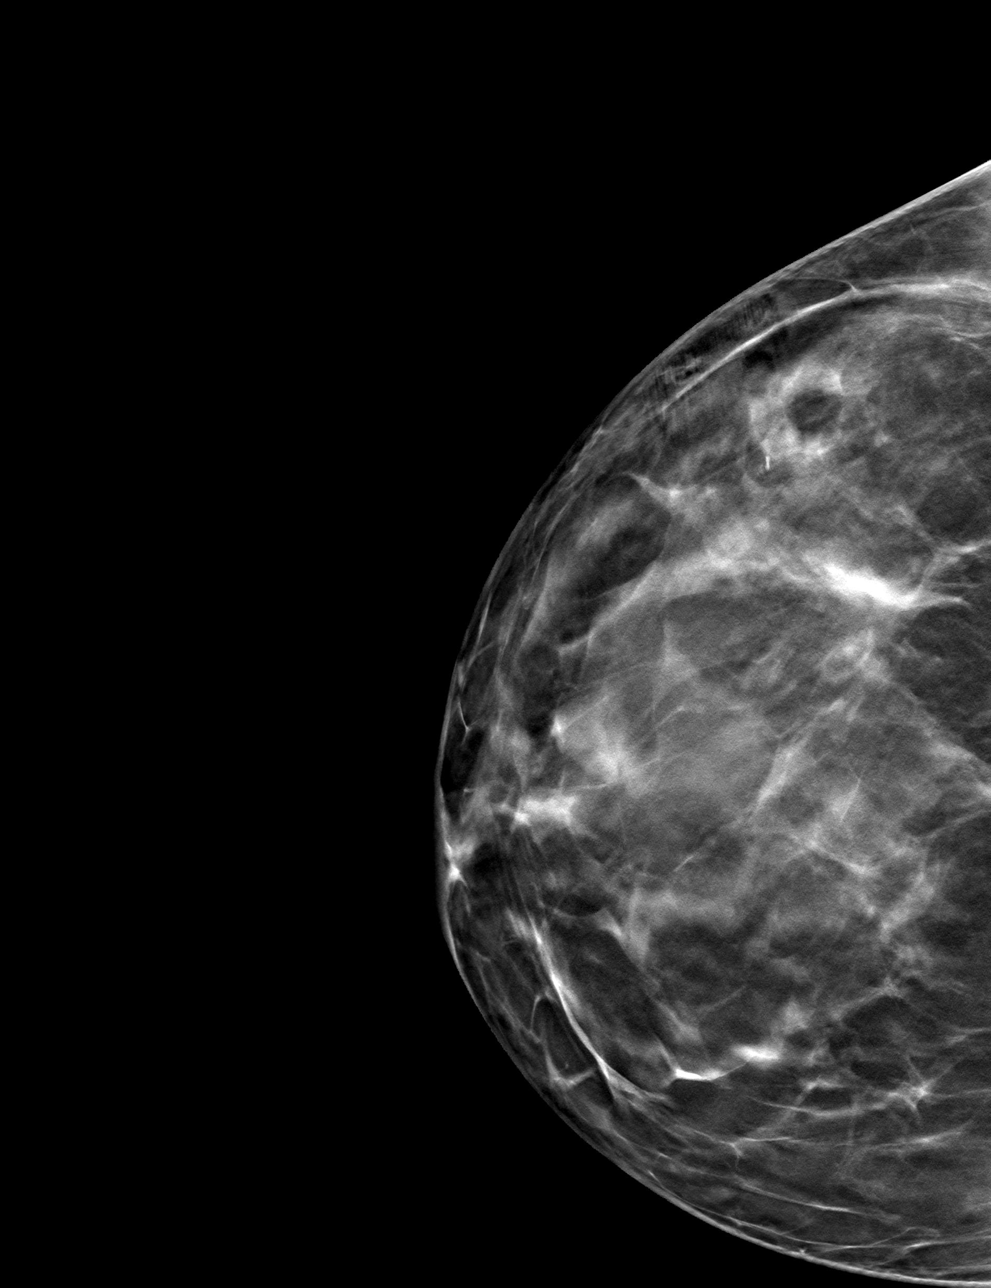

[R ML tomo · tomo slice 39/77.0]
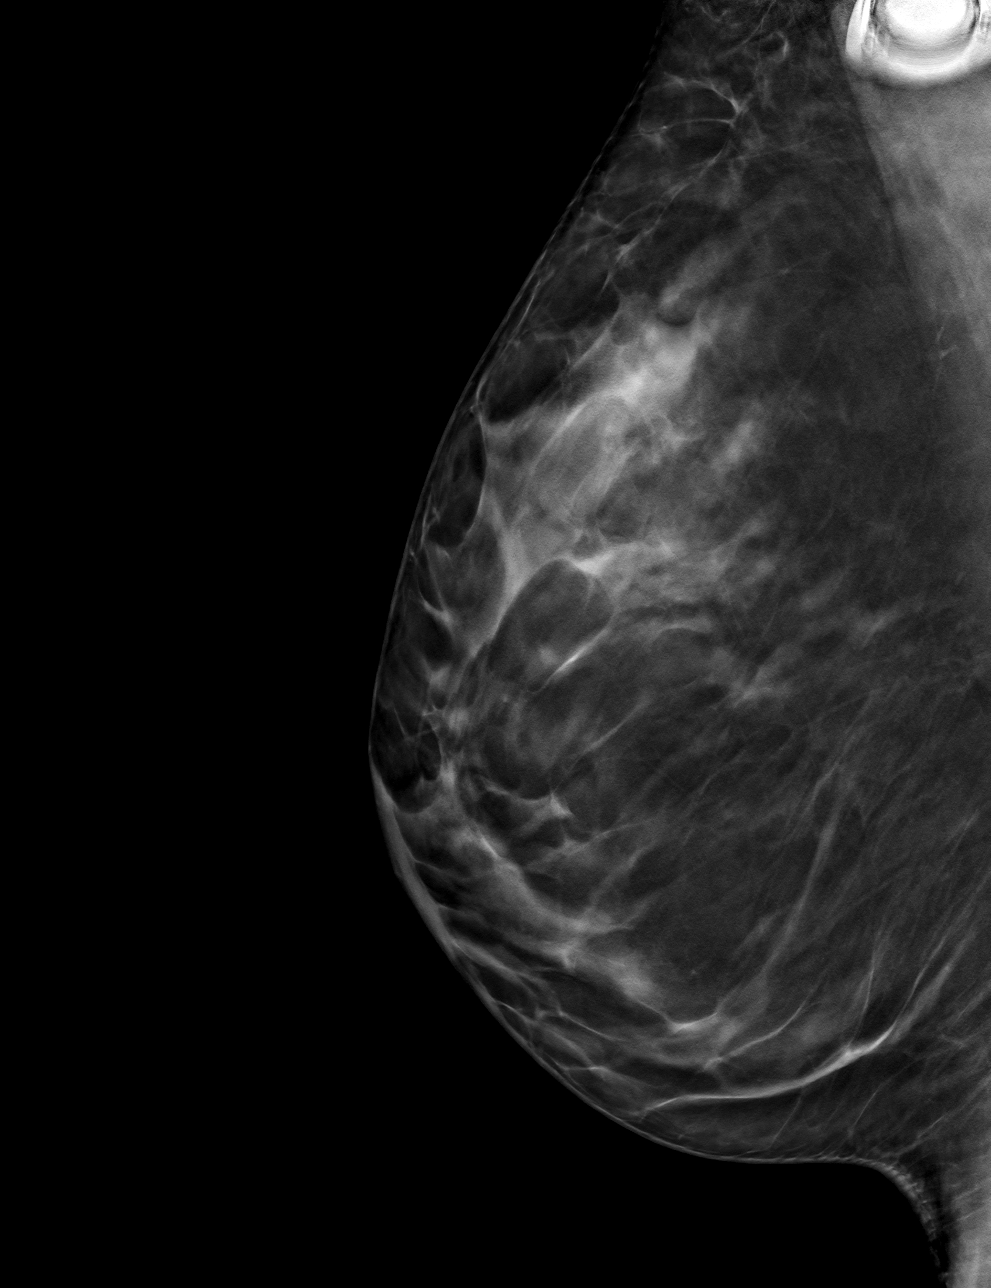

[4 of 12 positions shown; findings below may reference images not displayed]

FINDINGS: The patient presented at [HOSPITAL] for her
clip views.

Mammographic images were obtained following MR guided biopsy of
posterior UPPER-OUTER RIGHT breast non masslike enhancement. The
BARBELL biopsy marking clip is in expected position at the site of
biopsy.
IMPRESSION: Appropriate positioning of the BARBELL shaped biopsy marking clip at
the site of biopsy in the UPPER OUTER RIGHT breast.

Final Assessment: Post Procedure Mammograms for Marker Placement

## 2020-12-16 MED ORDER — IOHEXOL 300 MG/ML  SOLN
100.0000 mL | Freq: Once | INTRAMUSCULAR | Status: AC | PRN
  Administered 2020-12-16: 100 mL via INTRAVENOUS

## 2020-12-16 MED ORDER — GADOBUTROL 1 MMOL/ML IV SOLN
8.0000 mL | Freq: Once | INTRAVENOUS | Status: AC | PRN
  Administered 2020-12-16: 8 mL via INTRAVENOUS

## 2020-12-17 ENCOUNTER — Inpatient Hospital Stay (HOSPITAL_BASED_OUTPATIENT_CLINIC_OR_DEPARTMENT_OTHER): Payer: 59 | Admitting: Oncology

## 2020-12-17 ENCOUNTER — Encounter: Payer: Self-pay | Admitting: Oncology

## 2020-12-17 ENCOUNTER — Inpatient Hospital Stay: Payer: 59

## 2020-12-17 VITALS — BP 101/45 | HR 58 | Temp 98.7°F | Resp 16 | Ht 69.0 in | Wt 189.9 lb

## 2020-12-17 VITALS — BP 109/66 | HR 55 | Resp 16

## 2020-12-17 DIAGNOSIS — Z5111 Encounter for antineoplastic chemotherapy: Secondary | ICD-10-CM

## 2020-12-17 DIAGNOSIS — C50412 Malignant neoplasm of upper-outer quadrant of left female breast: Secondary | ICD-10-CM

## 2020-12-17 DIAGNOSIS — Z7189 Other specified counseling: Secondary | ICD-10-CM | POA: Diagnosis not present

## 2020-12-17 DIAGNOSIS — C50919 Malignant neoplasm of unspecified site of unspecified female breast: Secondary | ICD-10-CM

## 2020-12-17 DIAGNOSIS — Z171 Estrogen receptor negative status [ER-]: Secondary | ICD-10-CM

## 2020-12-17 DIAGNOSIS — Z5112 Encounter for antineoplastic immunotherapy: Secondary | ICD-10-CM

## 2020-12-17 LAB — CBC WITH DIFFERENTIAL/PLATELET
Abs Immature Granulocytes: 0.01 10*3/uL (ref 0.00–0.07)
Basophils Absolute: 0.1 10*3/uL (ref 0.0–0.1)
Basophils Relative: 1 %
Eosinophils Absolute: 0.6 10*3/uL — ABNORMAL HIGH (ref 0.0–0.5)
Eosinophils Relative: 8 %
HCT: 38.1 % (ref 36.0–46.0)
Hemoglobin: 12.7 g/dL (ref 12.0–15.0)
Immature Granulocytes: 0 %
Lymphocytes Relative: 19 %
Lymphs Abs: 1.3 10*3/uL (ref 0.7–4.0)
MCH: 30.3 pg (ref 26.0–34.0)
MCHC: 33.3 g/dL (ref 30.0–36.0)
MCV: 90.9 fL (ref 80.0–100.0)
Monocytes Absolute: 0.5 10*3/uL (ref 0.1–1.0)
Monocytes Relative: 7 %
Neutro Abs: 4.6 10*3/uL (ref 1.7–7.7)
Neutrophils Relative %: 65 %
Platelets: 217 10*3/uL (ref 150–400)
RBC: 4.19 MIL/uL (ref 3.87–5.11)
RDW: 12.1 % (ref 11.5–15.5)
WBC: 7.1 10*3/uL (ref 4.0–10.5)
nRBC: 0 % (ref 0.0–0.2)

## 2020-12-17 LAB — HEPATITIS B SURFACE ANTIGEN: Hepatitis B Surface Ag: NONREACTIVE

## 2020-12-17 LAB — COMPREHENSIVE METABOLIC PANEL
ALT: 39 U/L (ref 0–44)
AST: 35 U/L (ref 15–41)
Albumin: 4 g/dL (ref 3.5–5.0)
Alkaline Phosphatase: 54 U/L (ref 38–126)
Anion gap: 9 (ref 5–15)
BUN: 15 mg/dL (ref 6–20)
CO2: 25 mmol/L (ref 22–32)
Calcium: 9.1 mg/dL (ref 8.9–10.3)
Chloride: 104 mmol/L (ref 98–111)
Creatinine, Ser: 0.75 mg/dL (ref 0.44–1.00)
GFR, Estimated: >60 mL/min (ref >60)
Glucose, Bld: 93 mg/dL (ref 70–99)
Potassium: 4 mmol/L (ref 3.5–5.1)
Sodium: 138 mmol/L (ref 135–145)
Total Bilirubin: 0.6 mg/dL (ref 0.3–1.2)
Total Protein: 7.1 g/dL (ref 6.5–8.1)

## 2020-12-17 LAB — TSH: TSH: 1.229 u[IU]/mL (ref 0.350–4.500)

## 2020-12-17 LAB — HEPATITIS B CORE ANTIBODY, TOTAL: Hep B Core Total Ab: NONREACTIVE

## 2020-12-17 MED ORDER — ONDANSETRON HCL 8 MG PO TABS: 8.0000 mg | ORAL_TABLET | Freq: Two times a day (BID) | ORAL | 1 refills | Status: DC | PRN

## 2020-12-17 MED ORDER — PALONOSETRON HCL INJECTION 0.25 MG/5ML
0.2500 mg | Freq: Once | INTRAVENOUS | Status: AC
  Administered 2020-12-17: 0.25 mg via INTRAVENOUS
  Filled 2020-12-17: qty 5

## 2020-12-17 MED ORDER — SODIUM CHLORIDE 0.9 % IV SOLN
200.0000 mg | Freq: Once | INTRAVENOUS | Status: AC
  Administered 2020-12-17: 200 mg via INTRAVENOUS
  Filled 2020-12-17: qty 8

## 2020-12-17 MED ORDER — FAMOTIDINE IN NACL 20-0.9 MG/50ML-% IV SOLN
20.0000 mg | Freq: Once | INTRAVENOUS | Status: DC
  Filled 2020-12-17: qty 50

## 2020-12-17 MED ORDER — SODIUM CHLORIDE 0.9 % IV SOLN
80.0000 mg/m2 | Freq: Once | INTRAVENOUS | Status: AC
  Administered 2020-12-17: 162 mg via INTRAVENOUS
  Filled 2020-12-17: qty 27

## 2020-12-17 MED ORDER — SODIUM CHLORIDE 0.9 % IV SOLN
Freq: Once | INTRAVENOUS | Status: AC
  Filled 2020-12-17: qty 250

## 2020-12-17 MED ORDER — SODIUM CHLORIDE 0.9% FLUSH
10.0000 mL | Freq: Once | INTRAVENOUS | Status: AC
  Administered 2020-12-17: 10 mL via INTRAVENOUS
  Filled 2020-12-17: qty 10

## 2020-12-17 MED ORDER — SODIUM CHLORIDE 0.9 % IV SOLN
Freq: Once | INTRAVENOUS | Status: AC
  Filled 2020-12-17: qty 20

## 2020-12-17 MED ORDER — PROCHLORPERAZINE MALEATE 10 MG PO TABS: 10.0000 mg | ORAL_TABLET | Freq: Four times a day (QID) | ORAL | 1 refills | Status: DC | PRN

## 2020-12-17 MED ORDER — HEPARIN SOD (PORK) LOCK FLUSH 100 UNIT/ML IV SOLN
500.0000 [IU] | Freq: Once | INTRAVENOUS | Status: AC | PRN
  Administered 2020-12-17: 500 [IU]
  Filled 2020-12-17: qty 5

## 2020-12-17 MED ORDER — CARBOPLATIN CHEMO INJECTION 450 MG/45ML
225.0000 mg | Freq: Once | INTRAVENOUS | Status: AC
  Administered 2020-12-17: 230 mg via INTRAVENOUS
  Filled 2020-12-17: qty 23

## 2020-12-17 MED ORDER — DIPHENHYDRAMINE HCL 50 MG/ML IJ SOLN
50.0000 mg | Freq: Once | INTRAMUSCULAR | Status: AC
  Administered 2020-12-17: 50 mg via INTRAVENOUS
  Filled 2020-12-17: qty 1

## 2020-12-17 MED ORDER — FAMOTIDINE 20 MG IN NS 100 ML IVPB: 20.0000 mg | Freq: Two times a day (BID) | INTRAVENOUS | Status: DC

## 2020-12-17 MED ORDER — SODIUM CHLORIDE 0.9 % IV SOLN
10.0000 mg | Freq: Once | INTRAVENOUS | Status: AC
  Administered 2020-12-17: 10 mg via INTRAVENOUS
  Filled 2020-12-17: qty 10

## 2020-12-17 MED ORDER — LORAZEPAM 2 MG/ML IJ SOLN
0.5000 mg | Freq: Once | INTRAMUSCULAR | Status: AC
  Administered 2020-12-17: 0.5 mg via INTRAVENOUS
  Filled 2020-12-17: qty 1

## 2020-12-17 MED ORDER — SODIUM CHLORIDE 0.9% FLUSH
10.0000 mL | INTRAVENOUS | Status: DC | PRN
  Filled 2020-12-17: qty 10

## 2020-12-17 MED ORDER — LIDOCAINE-PRILOCAINE 2.5-2.5 % EX CREA: TOPICAL_CREAM | CUTANEOUS | 3 refills | Status: DC

## 2020-12-17 MED ORDER — DEXAMETHASONE 4 MG PO TABS: 8.0000 mg | ORAL_TABLET | Freq: Every day | ORAL | 1 refills | Status: DC

## 2020-12-17 MED ORDER — LORAZEPAM 0.5 MG PO TABS: 0.5000 mg | ORAL_TABLET | Freq: Four times a day (QID) | ORAL | 0 refills | Status: DC | PRN

## 2020-12-17 MED ORDER — HEPARIN SOD (PORK) LOCK FLUSH 100 UNIT/ML IV SOLN
INTRAVENOUS | Status: AC
  Filled 2020-12-17: qty 5

## 2020-12-17 MED ORDER — LEUPROLIDE ACETATE 3.75 MG IM KIT
3.7500 mg | PACK | Freq: Once | INTRAMUSCULAR | Status: AC
  Administered 2020-12-17: 3.75 mg via INTRAMUSCULAR
  Filled 2020-12-17: qty 3.75

## 2020-12-17 NOTE — Progress Notes (Signed)
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  She sought medical attention recently after thinking it was a possible cyst for all this while.She underwent a diagnostic bilateral mammogram and ultrasound which showed a 2.7 cm mass at the 12 o'clock position 6 cm from the nipple.  Ultrasound of the left axilla demonstrates 2 lymph nodes with mild thickened cortices of 4 mm.  There is skin thickening on the lower inner quadrant of the left breast on mammography.  Both the breast mass and the lymph node were biopsied and was positive for invasive mammary carcinoma grade 3.  Lymph node was also suspicious for extracapsular extension.    ER/PR and HER-2 negative  MRI showed irregular enhancing mass at the 12 o'clock position of the left breast measuring 2.5 x 2.3 cm and a linear component extending 2.3 cm anteriorly.  The mass in the anterior linear extension combined measure 4.3 cm.  3.9 cm in the cephalocaudal dimension.  Also an area of clumped non-mass enhancement in the posterior aspect of the lower quadrant of the left breast measuring 2.6 x 0.9 cm which looks suspicious.  2 enlarged left axillary lymph nodes and 2 mildly enlarged internal mammary lymph nodes.  4.3 cm clumped area of non-mass enhancement in the outer quadrant of the right breast.  3 right axillary lymph nodes with mild cortical  thickening.  Patient underwent biopsy of the right breast non-mass enhancement and the results are pending.  CT scan showed mildly enlarged right inguinal lymph nodes nonspecific.  Left upper breast mass with prominent left axillary lymph nodes.  Subcentimeter pulmonary nodules which are calcified and compatible with benign old granulomatous disease.  0.6 5.4 cm lucent lesion in the left first rib possibly a hemangioma or benign lesion.  Interval history-she is anxious about her MRI results and starting treatment today.  Denies any other significant complaints at this time  ECOG PS- 0 Pain scale- 0   Review of systems- Review of Systems  Constitutional: Negative for chills, fever, malaise/fatigue and weight loss.  HENT: Negative for congestion, ear discharge and nosebleeds.   Eyes: Negative for blurred vision.  Respiratory: Negative for cough, hemoptysis, sputum production, shortness of breath and wheezing.   Cardiovascular: Negative for chest pain, palpitations, orthopnea and claudication.  Gastrointestinal: Negative for abdominal pain, blood in stool, constipation, diarrhea, heartburn, melena, nausea and vomiting.  Genitourinary: Negative for dysuria, flank pain, frequency, hematuria and urgency.  Musculoskeletal: Negative for back pain, joint pain and myalgias.  Skin: Negative for rash.  Neurological: Negative for dizziness, tingling, focal weakness, seizures, weakness and headaches.  Endo/Heme/Allergies: Does not bruise/bleed easily.  Psychiatric/Behavioral: Negative for depression and suicidal ideas. The patient does not have insomnia.       Allergies  Allergen Reactions  . Amoxicillin     Other reaction(s): "too young to remember what they do to me"  . Sulfa Antibiotics     Other reaction(s): "too young to  remember what they do to me"     Past Medical History:  Diagnosis Date  . Anxiety   . Asthma   . Cancer ([place])   . Depression      Past Surgical History:   Procedure Laterality Date  . APPENDECTOMY  2018  . BREAST BIOPSY Left 11/26/2020   vision 12:00 6cmfn path pending  . BREAST BIOPSY Left 11/26/2020   LN t3 marler path pending  . PORTACATH PLACEMENT Right 12/13/2020   Procedure: INSERTION PORT-A-CATH;  Surgeon: Herbert Pun, MD;  Location: [facility] ORS;  Service: General;  Laterality: Right;    Social History   Socioeconomic History  . Marital status: Married    Spouse name: Not on file  . Number of children: Not on file  . Years of education: Not on file  . Highest education level: Not on file  Occupational History  . Not on file  Tobacco Use  . Smoking status: Never Smoker  . Smokeless tobacco: Never Used  Vaping Use  . Vaping Use: Never used  Substance and Sexual Activity  . Alcohol use: Yes    Comment: occassinally   . Drug use: Yes    Types: Marijuana  . Sexual activity: Not on file  Other Topics Concern  . Not on file  Social History Narrative  . Not on file   Social Determinants of Health   Financial Resource Strain: Not on file  Food Insecurity: Not on file  Transportation Needs: Not on file  Physical Activity: Not on file  Stress: Not on file  Social Connections: Not on file  Intimate Partner Violence: Not on file    No family history on file.   Current Outpatient Medications:  .  acetaminophen (TYLENOL) 325 MG tablet, , Disp: , Rfl:  .  albuterol (VENTOLIN HFA) 108 (90 Base) MCG/ACT inhaler, Inhale 1-2 puffs into the lungs every 6 (six) hours as needed for shortness of breath or wheezing., Disp: , Rfl:  .  Ascorbic Acid (SUPER C COMPLEX PO), Take 1 capsule by mouth daily., Disp: , Rfl:  .  busPIRone (BUSPAR) 15 MG tablet, Take 15 mg by mouth 2 (two) times daily., Disp: , Rfl:  .  citalopram (CELEXA) 40 MG tablet, Take 40 mg by mouth daily., Disp: , Rfl:  .  clonazePAM (KLONOPIN) 0.5 MG tablet, Take 1 tablet (0.5 mg total) by mouth 3 (three) times daily as needed., Disp: 60 tablet, Rfl: 0 .   dexamethasone (DECADRON) 4 MG tablet, Take 2 tablets (8 mg total) by mouth daily. Start the day after chemotherapy for 2 days., Disp: 30 tablet, Rfl: 1 .  ibuprofen (ADVIL) 200 MG tablet, , Disp: , Rfl:  .  lidocaine-prilocaine (EMLA) cream, Apply to affected area once, Disp: 30 g, Rfl: 3 .  LORazepam (ATIVAN) 0.5 MG tablet, Take 1 tablet (0.5 mg total) by mouth every 6 (six) hours as needed (Nausea or vomiting)., Disp: 30 tablet, Rfl: 0 .  norethindrone-ethinyl estradiol (LOESTRIN FE) 1-20 MG-MCG tablet, Take 1 tablet by mouth daily., Disp: , Rfl:  .  ondansetron (ZOFRAN) 8 MG tablet, Take 1 tablet (8 mg total) by mouth 2 (two) times daily as needed for refractory nausea / vomiting. Start on day 3 after chemo., Disp: 30 tablet, Rfl: 1 .  prochlorperazine (COMPAZINE) 10 MG tablet, Take 1 tablet (10 mg total) by mouth every 6 (six) hours as needed (Nausea or vomiting)., Disp: 30 tablet, Rfl: 1  Physical exam:  Vitals:   12/17/20 0911  BP: (!) 101/45  Pulse: (!) 58  Resp: 16  Temp: 98.7 F (37.1 C)  TempSrc: Oral  Weight: 189 lb 14.4 oz (86.1 kg)  Height: _0  (1.753 m)   Physical Exam Cardiovascular:     Rate and Rhythm: Normal rate and regular rhythm.  Pulmonary:     Effort: Pulmonary effort is normal.  Skin:    General: Skin is warm and dry.  Neurological:     Mental Status: She is alert and oriented to person, place, and time.      No flowsheet data found. No flowsheet data found.  No images are attached to the encounter.  DG Chest 1 View  Result Date: 12/13/2020 CLINICAL DATA:  Port-A-Cath insertion EXAM: CHEST  1 VIEW COMPARISON:  None. FINDINGS: Port-A-Cath tip is in the superior vena cava. No pneumothorax. Lungs are clear. Heart size and pulmonary vascularity are normal. No adenopathy. No bone lesions. IMPRESSION: Port-A-Cath tip in superior vena cava. No pneumothorax. Lungs clear. Cardiac silhouette normal. Electronically Signed   By: Lowella Grip III M.D.   On:  12/13/2020 13:59   NM Bone Scan Whole Body  Result Date: 12/12/2020 CLINICAL DATA:  Breast cancer, initial staging examination EXAM: NUCLEAR MEDICINE WHOLE BODY BONE SCAN TECHNIQUE: Whole body anterior and posterior images were obtained approximately 3 hours after intravenous injection of radiopharmaceutical. RADIOPHARMACEUTICALS:  22.737 mCi Technetium-71mMDP IV COMPARISON:  None. FINDINGS: Normal distribution of radiotracer within the visualized axial and appendicular skeleton. No evidence of osseous metastatic disease. Mild soft tissue uptake within the breast tissue bilaterally. Otherwise normal soft tissue distribution. Normal uptake and excretion within the kidneys and bladder. IMPRESSION: No evidence of osseous metastatic disease. Electronically Signed   By: AFidela SalisburyMD   On: 12/12/2020 23:21   MR BREAST [place]ATERAL [place]  Result Date: 12/06/2020 CLINICAL DATA:  Newly diagnosed left breast invasive mammary carcinoma and metastatic left axillary lymph node. LABS:  None obtained on site today. EXAM: BILATERAL BREAST MRI WITH AND WITHOUT CONTRAST TECHNIQUE: Multiplanar, multisequence MR images of both breasts were obtained prior to and following the intravenous administration of 7.5 ml of Gadavist Three-dimensional MR images were rendered by post-processing of the original MR data on an independent workstation. The three-dimensional MR images were interpreted, and findings are reported in the following complete MRI report for this study. Three dimensional images were evaluated at the independent interpreting workstation using the DynaCAD thin client. COMPARISON:  Recent mammogram, ultrasound and biopsy examinations. FINDINGS: Breast composition: c. Heterogeneous fibroglandular tissue. Background parenchymal enhancement: Minimal. Right breast: Clumped and linear non mass enhancement in the posterior aspect of the upper outer quadrant of the right breast measuring 4.1 cm in AP diameter  on image number 81 series 14, 0.9 cm in width on image number 83 series 14 and 2.3 cm in cephalo caudal dimension in the sagittal plane. This has low grade enhancement with persistent kinetics. Left breast: Irregular enhancing mass with a central nonenhancing component in the posterior aspect of the 12 o'clock position of the left breast, containing a biopsy marker clip artifact. This corresponds to the recently biopsied invasive mammary carcinoma. On image number 78 series 10, this measures 2.5 x 2.3 cm in maximum dimensions. There is a linear component extending 2.3 cm anteriorly from the medial aspect of this mass. The mass and the anterior linear extension combined measure 4.3 cm. The mass measures 3.9 cm in cephalo caudal dimension in the sagittal plane. The mass and anterior  linear extension have a mixture of enhancement kinetics, including rapid wash-in/washout. There is also a linear area of clumped, non mass enhancement in the posterior aspect of the lower outer quadrant of the left breast. This measures 2.6 x 0.9 cm on image number 49 series 10 and 0.4 cm in cephalo caudal dimension in the sagittal plane. This also has a mixture of enhancement kinetics, including rapid wash-in/washout. Lymph nodes: Post biopsy changes involving a left axillary lymph node with mild cortical thickening, corresponding to the recently biopsied metastatic left axillary lymph node. There is a smaller, similar appearing lymph node more posteriorly, corresponding the 2nd suspicious lymph node seen at ultrasound. There are 2 mildly enlarged left internal mammary lymph nodes. The largest is on image number 35 series 3, measuring 8 x 5 mm. There are 3 right axillary node with mild cortical thickening. Ancillary findings:  None. IMPRESSION: 1. 4.3 x 3.9 x 2.3 cm biopsy-proven invasive mammary carcinoma in the 12 o'clock position of the left breast, posteriorly. This includes a 2.3 cm linear component extending anteriorly. 2. 2.6 x 0.9  x 0.4 cm area of linear, clumped, non mass enhancement in the posterior aspect of the lower outer quadrant of the left breast. This is suspicious for additional malignancy. 3. 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 4. 4.1 x 2.3 x 0.9 cm area of clumped and linear non mass enhancement in the posterior aspect of the upper-outer quadrant of the right breast suspicious for malignancy. RECOMMENDATION: 1. MR guided core needle biopsy of the 2.6 cm area of linear, clumped non mass enhancement in the posterior aspect of the lower outer quadrant of the left breast. 2. MR guided core needle biopsy of the anterior extent of the 2.3 cm linear component extending anteriorly from the biopsy proven invasive mammary carcinoma in the 12 o'clock position of the left breast. 3. MR guided core needle biopsy of the 4.1 cm area of clumped and linear non mass enhancement in the posterior upper outer right breast. 4. Right axillary ultrasound. BI-RADS CATEGORY  4: Suspicious. Electronically Signed   By: Claudie Revering M.D.   On: 12/06/2020 16:47   DG C-Arm 1-60 Min-No Report  Result Date: 12/13/2020 Fluoroscopy was utilized by the requesting physician.  No radiographic interpretation.   MM CLIP PLACEMENT LEFT  Result Date: 11/26/2020 CLINICAL DATA:  Post biopsy mammogram of the left breast for clip placement. EXAM: DIAGNOSTIC LEFT MAMMOGRAM POST ULTRASOUND BIOPSY COMPARISON:  Previous exam(s). FINDINGS: Mammographic images were obtained following ultrasound guided biopsy of a left breast mass and a left axillary lymph node. The biopsy marking clips are in expected position at the sites of biopsy. IMPRESSION: 1. Appropriate positioning of the vision shaped biopsy marking clip at the site of biopsy in the left breast mass at 12 o'clock. 2. Appropriate positioning of the HydroMARK clip at the site of the lymph node in the left axilla. Final Assessment: Post  Procedure Mammograms for Marker Placement Electronically Signed   By: Ammie Ferrier M.D.   On: 11/26/2020 09:19   MM CLIP PLACEMENT RIGHT  Result Date: 12/16/2020 CLINICAL DATA:  Evaluate BARBELL clip placement following MR guided RIGHT breast biopsy. EXAM: DIAGNOSTIC RIGHT MAMMOGRAM POST MRI BIOPSY COMPARISON:  Previous exam(s). FINDINGS: The patient presented at [facility] her clip views. Mammographic images were obtained following MR guided biopsy of posterior UPPER-OUTER RIGHT breast non masslike enhancement. The BARBELL biopsy marking clip is in expected position  at the site of biopsy. IMPRESSION: Appropriate positioning of the BARBELL shaped biopsy marking clip at the site of biopsy in the UPPER OUTER RIGHT breast. Final Assessment: Post Procedure Mammograms for Marker Placement Electronically Signed   By: Margarette Canada M.D.   On: 12/16/2020 13:36   [place] LT BREAST BX W LOC DEV 1ST LESION IMG BX SPEC US GUIDE  Addendum Date: 12/02/2020   ADDENDUM REPORT: 11/29/2020 15:14 ADDENDUM: PATHOLOGY revealed: Site A. BREAST, LEFT AT 12:00, 6 CM FROM THE NIPPLE; ULTRASOUND-GUIDED CORE NEEDLE BIOPSY: - INVASIVE MAMMARY CARCINOMA, NO SPECIAL TYPE. Size of invasive carcinoma: At least 13 mm in this sample. Grade 3. Ductal carcinoma in situ: Not identified. Lymphovascular invasion: Not identified. Pathology results are CONCORDANT with imaging findings, per Dr. Ammie Ferrier. PATHOLOGY revealed: Site B. LYMPH NODE, LEFT AXILLARY; ULTRASOUND-GUIDED CORE NEEDLE BIOPSY: - FRAGMENTS OF LYMPH NODE INVOLVED BY MACRO METASTATIC CARCINOMA, 10 MM IN GREATEST EXTENT, SUSPICIOUS FOR EXTRACAPSULAR EXTENSION. Pathology results are CONCORDANT with imaging findings, per Dr. Ammie Ferrier. Pathology results and recommendations below were discussed with patient by telephone on 11/28/2020. Patient reported biopsy site within normal limits with slight tenderness at the site. Post biopsy care instructions  were reviewed, questions were answered and my direct phone number was provided to patient. Patient was instructed to call [facility] if any concerns or questions arise related to the biopsy. Recommendations: 1. Surgical and oncological consultation: Request for surgical and oncological consultation relayed to Al Pimple RN and Tanya Nones RN at [facility] by Electa Sniff RN on 11/27/2020. 2. Consider doing skin punch biopsy of region of thickening in the lower-inner quadrant of LEFT breast, if this would alter clinical management. Al Pimple RN stated patient has upcoming appointment with medical oncologist (Dr. Randa Evens) who will address the need for skin punch biopsy. Pathology results reported by Electa Sniff RN on 11/29/2020. Electronically Signed   By: Ammie Ferrier M.D.   On: 11/29/2020 15:14   Result Date: 12/02/2020 CLINICAL DATA:  30 year old female presenting for ultrasound-guided biopsy of a left breast mass and left axillary lymph node. EXAM: ULTRASOUND GUIDED LEFT BREAST CORE NEEDLE BIOPSY COMPARISON:  Previous exam(s). PROCEDURE: I met with the patient and we discussed the procedure of ultrasound-guided biopsy, including benefits and alternatives. We discussed the high likelihood of a successful procedure. We discussed the risks of the procedure, including infection, bleeding, tissue injury, clip migration, and inadequate sampling. Informed written consent was given. The usual time-out protocol was performed immediately prior to the procedure. Lesion quadrant: Upper outer quadrant Using sterile technique and 1% Lidocaine as local anesthetic, under direct ultrasound visualization, a 14 gauge spring-loaded device was used to perform biopsy of a mass in the left breast at 12 o'clock using a lateral approach. At the conclusion of the procedure a vision tissue marker clip was deployed into the biopsy cavity. -------------------------------------------------- Lesion  quadrant: Left axilla Using sterile technique and 1% Lidocaine as local anesthetic, under direct ultrasound visualization, a 14 gauge spring-loaded device was used to perform biopsy of a left axillary lymph node using an inferior approach. At the conclusion of the procedure a HydroMARK tissue marker clip was deployed into the biopsy cavity. Follow up 2 view mammogram was performed and dictated separately. IMPRESSION: 1. Ultrasound guided biopsy of a left breast mass at 12 o'clock. No apparent complications. 2. Ultrasound-guided biopsy of a left axillary lymph node. No apparent complications. Electronically Signed: By: Ammie Ferrier M.D. On: 11/26/2020 09:13  [place] LT BREAST BX W LOC DEV EA ADD LESION IMG BX SPEC US GUIDE  Addendum Date: 12/02/2020   ADDENDUM REPORT: 11/29/2020 15:14 ADDENDUM: PATHOLOGY revealed: Site A. BREAST, LEFT AT 12:00, 6 CM FROM THE NIPPLE; ULTRASOUND-GUIDED CORE NEEDLE BIOPSY: - INVASIVE MAMMARY CARCINOMA, NO SPECIAL TYPE. Size of invasive carcinoma: At least 13 mm in this sample. Grade 3. Ductal carcinoma in situ: Not identified. Lymphovascular invasion: Not identified. Pathology results are CONCORDANT with imaging findings, per Dr. Ammie Ferrier. PATHOLOGY revealed: Site B. LYMPH NODE, LEFT AXILLARY; ULTRASOUND-GUIDED CORE NEEDLE BIOPSY: - FRAGMENTS OF LYMPH NODE INVOLVED BY MACRO METASTATIC CARCINOMA, 10 MM IN GREATEST EXTENT, SUSPICIOUS FOR EXTRACAPSULAR EXTENSION. Pathology results are CONCORDANT with imaging findings, per Dr. Ammie Ferrier. Pathology results and recommendations below were discussed with patient by telephone on 11/28/2020. Patient reported biopsy site within normal limits with slight tenderness at the site. Post biopsy care instructions were reviewed, questions were answered and my direct phone number was provided to patient. Patient was instructed to call [facility] if any concerns or questions arise related to the biopsy. Recommendations: 1.  Surgical and oncological consultation: Request for surgical and oncological consultation relayed to Al Pimple RN and Tanya Nones RN at [facility] by Electa Sniff RN on 11/27/2020. 2. Consider doing skin punch biopsy of region of thickening in the lower-inner quadrant of LEFT breast, if this would alter clinical management. Al Pimple RN stated patient has upcoming appointment with medical oncologist (Dr. Randa Evens) who will address the need for skin punch biopsy. Pathology results reported by Electa Sniff RN on 11/29/2020. Electronically Signed   By: Ammie Ferrier M.D.   On: 11/29/2020 15:14   Result Date: 12/02/2020 CLINICAL DATA:  30 year old female presenting for ultrasound-guided biopsy of a left breast mass and left axillary lymph node. EXAM: ULTRASOUND GUIDED LEFT BREAST CORE NEEDLE BIOPSY COMPARISON:  Previous exam(s). PROCEDURE: I met with the patient and we discussed the procedure of ultrasound-guided biopsy, including benefits and alternatives. We discussed the high likelihood of a successful procedure. We discussed the risks of the procedure, including infection, bleeding, tissue injury, clip migration, and inadequate sampling. Informed written consent was given. The usual time-out protocol was performed immediately prior to the procedure. Lesion quadrant: Upper outer quadrant Using sterile technique and 1% Lidocaine as local anesthetic, under direct ultrasound visualization, a 14 gauge spring-loaded device was used to perform biopsy of a mass in the left breast at 12 o'clock using a lateral approach. At the conclusion of the procedure a vision tissue marker clip was deployed into the biopsy cavity. -------------------------------------------------- Lesion quadrant: Left axilla Using sterile technique and 1% Lidocaine as local anesthetic, under direct ultrasound visualization, a 14 gauge spring-loaded device was used to perform biopsy of a left axillary lymph node using an  inferior approach. At the conclusion of the procedure a HydroMARK tissue marker clip was deployed into the biopsy cavity. Follow up 2 view mammogram was performed and dictated separately. IMPRESSION: 1. Ultrasound guided biopsy of a left breast mass at 12 o'clock. No apparent complications. 2. Ultrasound-guided biopsy of a left axillary lymph node. No apparent complications. Electronically Signed: By: Ammie Ferrier M.D. On: 11/26/2020 09:13   MR RT BREAST BX W LOC DEV 1ST LESION IMAGE BX SPEC MR GUIDE  Result Date: 12/16/2020 CLINICAL DATA:  30 year old female for tissue sampling of 4.1 cm area of UPPER-OUTER RIGHT breast non masslike enhancement. Recent diagnosis of LEFT breast cancer. EXAM: MRI GUIDED CORE NEEDLE BIOPSY  OF THE RIGHT BREAST TECHNIQUE: Multiplanar, multisequence MR imaging of the RIGHT breast was performed both before and after administration of intravenous contrast. CONTRAST:  69m GADAVIST GADOBUTROL 1 MMOL/ML IV SOLN COMPARISON:  Previous exams. FINDINGS: I met with the patient, and we discussed the procedure of MRI guided biopsy, including risks, benefits, and alternatives. Specifically, we discussed the risks of infection, bleeding, tissue injury, clip migration, and inadequate sampling. Informed, written consent was given. The usual time out protocol was performed immediately prior to the procedure. Using sterile technique, 1% Lidocaine, MRI guidance, and a 9 gauge vacuum assisted device, biopsy was performed of the posterior UPPER-OUTER RIGHT breast non masslike enhancement using a LATERAL approach. At the conclusion of the procedure, a BARBELL tissue marker clip was deployed into the biopsy cavity. Follow-up 2-view mammogram was performed and dictated separately. IMPRESSION: MRI guided biopsy of RIGHT breast biopsy. No apparent complications. The patient states she is unsure if she will undergo LEFT mastectomy, and there are 2 other LEFT breast areas on recent breast MR that were  recommended to be sampled if breast conservation is desired. Only the RIGHT breast was sampled today as we only had an order for the MR guided RIGHT breast biopsy. If LEFT breast conservation will be considered, then MR guided biopsies of the 2 other areas in the LEFT breast is recommended (as on prior report). Electronically Signed   By: JMargarette CanadaM.D.   On: 12/16/2020 10:28     Assessment and plan- Patient is a 30y.o. female with newly diagnosed clinical prognostic stage IIIB 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 breast cancer cT2 N1 M0 here for on treatment assessment prior to cycle 1 of neoadjuvant carbotaxol and Keytruda  Discussed the results of the MRI of the bilateral breasts Which showed that her primary tumor size with anterior extension measures about 4.3 cm in size.  Thin clumped area of non-mass enhancement in the left breast measured 2.6 x 0.9 cm and was also concerning for an additional focus of malignancy.  There were 2 mildly enlarged left internal mammary lymph nodes mildly enlarged 5 mm and it is unclear if they are involved.  3 right axillary nodes with mild cortical thickening.  There was an area of 4.1 cm non-mass enhancement in the right breast as well.  Patient has already had a right breast biopsy done yesterday and the results were not back at the time of my visit but did come back negative for malignancy.  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  I would therefore recommend a left mastectomy regardless and would not subject to a second left breast biopsy at this time  Patient also had a CT chest abdomen and pelvis with contrast which did not show any evidence of distant metastatic disease.She did have right paratracheal lymph node which was 0.6 cm but was not pathologically enlarged.  Bilateral subcentimeter lung nodules which  appeared to be calcified granulomas.  I will plan to follow-up on these findings with a repeat CT scan in 6 months time.  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  I will see her back in 1 week for cycle 2 of carbotaxol chemotherapy.  She will also receive Lupron for ovarian suppression during chemotherapy.  She did not desire  fertility conservation prior to starting chemotherapy.   Total face to face encounter time for this patient visit was 45 min.      Visit Diagnosis 1. Malignant neoplasm of upper-outer quadrant of left breast in female, estrogen receptor negative ([place])   2. Encounter for antineoplastic chemotherapy   3. Encounter for antineoplastic immunotherapy   4. Goals of care, counseling/discussion      Dr. Randa Evens, MD, MPH [facility] [phone] 12/19/2020 5:17 PM

## 2020-12-17 NOTE — Progress Notes (Signed)
New pt for breast cancer. First treatment today . Had bx on lright breast yest. Has tegaderm on the bx site and has blood under the tegaderm. She says she has hydrocodone and it did not help her with pain but it made her sleepy.

## 2020-12-17 NOTE — Progress Notes (Signed)
Assisted patient with finding EMLA cream prior to first chemotherapy appointment today.  Met in lobby prior to treatment.

## 2020-12-17 NOTE — Progress Notes (Signed)
Carboplatin Dosed at goal AUC 1.5 qweek per Keynote 522 trial.

## 2020-12-18 ENCOUNTER — Ambulatory Visit: Payer: 59

## 2020-12-18 ENCOUNTER — Other Ambulatory Visit: Payer: 59

## 2020-12-18 LAB — HEPATITIS B SURFACE ANTIBODY, QUANTITATIVE: Hep B S AB Quant (Post): 31.1 m[IU]/mL (ref >9.9)

## 2020-12-19 ENCOUNTER — Telehealth: Payer: Self-pay

## 2020-12-19 NOTE — Telephone Encounter (Signed)
Telephone call to patient for follow up after receiving first infusion.   Patient states infusion went great.  States eating good and drinking plenty of fluids.   Denies any nausea or vomiting.  Encouraged patient to call for any concerns or questions. 

## 2020-12-24 ENCOUNTER — Encounter: Payer: Self-pay | Admitting: Oncology

## 2020-12-24 ENCOUNTER — Inpatient Hospital Stay: Payer: 59

## 2020-12-24 ENCOUNTER — Inpatient Hospital Stay (HOSPITAL_BASED_OUTPATIENT_CLINIC_OR_DEPARTMENT_OTHER): Payer: 59 | Admitting: Oncology

## 2020-12-24 VITALS — BP 108/72 | HR 74 | Temp 98.1°F | Resp 20 | Wt 189.4 lb

## 2020-12-24 DIAGNOSIS — C50919 Malignant neoplasm of unspecified site of unspecified female breast: Secondary | ICD-10-CM | POA: Diagnosis not present

## 2020-12-24 DIAGNOSIS — Z5111 Encounter for antineoplastic chemotherapy: Secondary | ICD-10-CM | POA: Diagnosis not present

## 2020-12-24 DIAGNOSIS — Z5112 Encounter for antineoplastic immunotherapy: Secondary | ICD-10-CM | POA: Diagnosis not present

## 2020-12-24 DIAGNOSIS — Z171 Estrogen receptor negative status [ER-]: Secondary | ICD-10-CM

## 2020-12-24 DIAGNOSIS — C50412 Malignant neoplasm of upper-outer quadrant of left female breast: Secondary | ICD-10-CM

## 2020-12-24 LAB — CBC WITH DIFFERENTIAL/PLATELET
Abs Immature Granulocytes: 0.04 10*3/uL (ref 0.00–0.07)
Basophils Absolute: 0.1 10*3/uL (ref 0.0–0.1)
Basophils Relative: 1 %
Eosinophils Absolute: 0.7 10*3/uL — ABNORMAL HIGH (ref 0.0–0.5)
Eosinophils Relative: 11 %
HCT: 39 % (ref 36.0–46.0)
Hemoglobin: 13.2 g/dL (ref 12.0–15.0)
Immature Granulocytes: 1 %
Lymphocytes Relative: 27 %
Lymphs Abs: 1.8 10*3/uL (ref 0.7–4.0)
MCH: 30.2 pg (ref 26.0–34.0)
MCHC: 33.8 g/dL (ref 30.0–36.0)
MCV: 89.2 fL (ref 80.0–100.0)
Monocytes Absolute: 0.3 10*3/uL (ref 0.1–1.0)
Monocytes Relative: 5 %
Neutro Abs: 3.6 10*3/uL (ref 1.7–7.7)
Neutrophils Relative %: 55 %
Platelets: 244 10*3/uL (ref 150–400)
RBC: 4.37 MIL/uL (ref 3.87–5.11)
RDW: 12.2 % (ref 11.5–15.5)
WBC: 6.5 10*3/uL (ref 4.0–10.5)
nRBC: 0 % (ref 0.0–0.2)

## 2020-12-24 LAB — COMPREHENSIVE METABOLIC PANEL
ALT: 37 U/L (ref 0–44)
AST: 30 U/L (ref 15–41)
Albumin: 4.2 g/dL (ref 3.5–5.0)
Alkaline Phosphatase: 59 U/L (ref 38–126)
Anion gap: 7 (ref 5–15)
BUN: 16 mg/dL (ref 6–20)
CO2: 26 mmol/L (ref 22–32)
Calcium: 8.9 mg/dL (ref 8.9–10.3)
Chloride: 102 mmol/L (ref 98–111)
Creatinine, Ser: 0.71 mg/dL (ref 0.44–1.00)
GFR, Estimated: >60 mL/min (ref >60)
Glucose, Bld: 87 mg/dL (ref 70–99)
Potassium: 3.9 mmol/L (ref 3.5–5.1)
Sodium: 135 mmol/L (ref 135–145)
Total Bilirubin: 0.7 mg/dL (ref 0.3–1.2)
Total Protein: 7.1 g/dL (ref 6.5–8.1)

## 2020-12-24 LAB — TSH: TSH: 1.78 u[IU]/mL (ref 0.350–4.500)

## 2020-12-24 MED ORDER — SODIUM CHLORIDE 0.9 % IV SOLN
10.0000 mg | Freq: Once | INTRAVENOUS | Status: AC
  Administered 2020-12-24: 10 mg via INTRAVENOUS
  Filled 2020-12-24: qty 10

## 2020-12-24 MED ORDER — HEPARIN SOD (PORK) LOCK FLUSH 100 UNIT/ML IV SOLN
500.0000 [IU] | Freq: Once | INTRAVENOUS | Status: AC | PRN
  Administered 2020-12-24: 500 [IU]
  Filled 2020-12-24: qty 5

## 2020-12-24 MED ORDER — DIPHENHYDRAMINE HCL 50 MG/ML IJ SOLN
50.0000 mg | Freq: Once | INTRAMUSCULAR | Status: AC
  Administered 2020-12-24: 50 mg via INTRAVENOUS
  Filled 2020-12-24: qty 1

## 2020-12-24 MED ORDER — SODIUM CHLORIDE 0.9 % IV SOLN
Freq: Once | INTRAVENOUS | Status: AC
  Filled 2020-12-24: qty 250

## 2020-12-24 MED ORDER — SODIUM CHLORIDE 0.9% FLUSH
10.0000 mL | Freq: Once | INTRAVENOUS | Status: AC
  Administered 2020-12-24: 10 mL via INTRAVENOUS
  Filled 2020-12-24: qty 10

## 2020-12-24 MED ORDER — FAMOTIDINE 20 MG IN NS 100 ML IVPB
20.0000 mg | Freq: Once | INTRAVENOUS | Status: DC
  Filled 2020-12-24: qty 100

## 2020-12-24 MED ORDER — SODIUM CHLORIDE 0.9% FLUSH
10.0000 mL | INTRAVENOUS | Status: DC | PRN
  Administered 2020-12-24: 10 mL
  Filled 2020-12-24: qty 10

## 2020-12-24 MED ORDER — PALONOSETRON HCL INJECTION 0.25 MG/5ML
0.2500 mg | Freq: Once | INTRAVENOUS | Status: AC
  Administered 2020-12-24: 0.25 mg via INTRAVENOUS
  Filled 2020-12-24: qty 5

## 2020-12-24 MED ORDER — FAMOTIDINE 20 MG IN NS 100 ML IVPB
20.0000 mg | Freq: Once | INTRAVENOUS | Status: AC
  Administered 2020-12-24: 20 mg via INTRAVENOUS
  Filled 2020-12-24: qty 20

## 2020-12-24 MED ORDER — SODIUM CHLORIDE 0.9 % IV SOLN
225.0000 mg | Freq: Once | INTRAVENOUS | Status: AC
  Administered 2020-12-24: 230 mg via INTRAVENOUS
  Filled 2020-12-24: qty 23

## 2020-12-24 MED ORDER — SODIUM CHLORIDE 0.9 % IV SOLN
80.0000 mg/m2 | Freq: Once | INTRAVENOUS | Status: AC
  Administered 2020-12-24: 162 mg via INTRAVENOUS
  Filled 2020-12-24: qty 27

## 2020-12-24 NOTE — Progress Notes (Signed)
Hematology/Oncology Consult note [facility]  Telephone:([phone] [phone]  Patient Care Team: Juluis Pitch, MD as PCP - General (Family Medicine)   Name of the patient: Vickie Mathis  [id number]  June 01, 1991   Date of visit: 12/24/20  Diagnosis- locally advanced 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 breast cancer at least T2 N1 MX  Chief complaint/ Reason for visit-on treatment assessment prior to cycle 2 of carbotaxol chemotherapy  Heme/Onc history: patient is a 30 year old female who self palpated a left breast mass about 10 months ago and since then it has been slowly growing. She sought medical attention recently after thinking it was a possible cyst for all this while.She underwent a diagnostic bilateral mammogram and ultrasound which showed a 2.7 cm mass at the 12 o'clock position 6 cm from the nipple. Ultrasound of the left axilla demonstrates 2 lymph nodes with mild thickened cortices of 4 mm. There is skin thickening on the lower inner quadrant of the left breast on mammography.Both the breast mass and the lymph node were biopsied and was positive for invasive mammary carcinoma grade 3. Lymph node was also suspicious for extracapsular extension.   ER/PR and HER-2 negative  MRI showed irregular enhancing mass at the 12 o'clock position of the left breast measuring 2.5 x 2.3 cm and a linear component extending 2.3 cm anteriorly.  The mass in the anterior linear extension combined measure 4.3 cm.  3.9 cm in the cephalocaudal dimension.  Also an area of clumped non-mass enhancement in the posterior aspect of the lower quadrant of the left breast measuring 2.6 x 0.9 cm which looks suspicious.  2 enlarged left axillary lymph nodes and 2 mildly enlarged internal mammary lymph nodes.  4.3 cm clumped area of non-mass enhancement in the outer quadrant of the right breast.  3 right axillary lymph nodes with mild cortical thickening.  Patient underwent biopsy  of the right breast non-mass enhancement and that was negative for malignancy  CT scan showed mildly enlarged right inguinal lymph nodes nonspecific.  Left upper breast mass with prominent left axillary lymph nodes.  Subcentimeter pulmonary nodules which are calcified and compatible with benign old granulomatous disease.  0.6 5.4 cm lucent lesion in the left first rib possibly a hemangioma or benign lesion.  Interval history-patient reports some ongoing fatigue with chemotherapy but otherwise tolerated well without any significant side effects.  Did not have any nausea or vomiting.  ECOG PS- 0 Pain scale- 0   Review of systems- Review of Systems  Constitutional: Positive for malaise/fatigue. Negative for chills, fever and weight loss.  HENT: Negative for congestion, ear discharge and nosebleeds.   Eyes: Negative for blurred vision.  Respiratory: Negative for cough, hemoptysis, sputum production, shortness of breath and wheezing.   Cardiovascular: Negative for chest pain, palpitations, orthopnea and claudication.  Gastrointestinal: Negative for abdominal pain, blood in stool, constipation, diarrhea, heartburn, melena, nausea and vomiting.  Genitourinary: Negative for dysuria, flank pain, frequency, hematuria and urgency.  Musculoskeletal: Negative for back pain, joint pain and myalgias.  Skin: Negative for rash.  Neurological: Negative for dizziness, tingling, focal weakness, seizures, weakness and headaches.  Endo/Heme/Allergies: Does not bruise/bleed easily.  Psychiatric/Behavioral: Negative for depression and suicidal ideas. The patient does not have insomnia.       Allergies  Allergen Reactions  . Amoxicillin     Other reaction(s): "too young to remember what they do to me"  . Sulfa Antibiotics     Other reaction(s): "too young to remember what  they do to me"     Past Medical History:  Diagnosis Date  . Anxiety   . Asthma   . Breast cancer ([place])   . Depression   .  Varicose veins of bilateral lower extremities with pain      Past Surgical History:  Procedure Laterality Date  . APPENDECTOMY  2018  . BREAST BIOPSY Left 11/26/2020   vision 12:00 6cmfn path pending  . BREAST BIOPSY Left 11/26/2020   LN t3 marler path pending  . PORTACATH PLACEMENT Right 12/13/2020   Procedure: INSERTION PORT-A-CATH;  Surgeon: Herbert Pun, MD;  Location: [facility] ORS;  Service: General;  Laterality: Right;    Social History   Socioeconomic History  . Marital status: Married    Spouse name: Not on file  . Number of children: Not on file  . Years of education: Not on file  . Highest education level: Not on file  Occupational History  . Not on file  Tobacco Use  . Smoking status: Never Smoker  . Smokeless tobacco: Never Used  Vaping Use  . Vaping Use: Never used  Substance and Sexual Activity  . Alcohol use: Yes    Comment: occassinally   . Drug use: Yes    Types: Marijuana  . Sexual activity: Yes  Other Topics Concern  . Not on file  Social History Narrative  . Not on file   Social Determinants of Health   Financial Resource Strain: Not on file  Food Insecurity: Not on file  Transportation Needs: Not on file  Physical Activity: Not on file  Stress: Not on file  Social Connections: Not on file  Intimate Partner Violence: Not on file    Family History  Problem Relation Age of Onset  . Diabetes Father   . Varicose Veins Father   . Diabetes Paternal Aunt   . Uterine cancer Paternal Aunt   . Diabetes Paternal Grandmother   . Uterine cancer Paternal Grandmother   . Thyroid disease Mother   . Thyroid disease Maternal Aunt   . Thyroid disease Maternal Grandmother      Current Outpatient Medications:  .  busPIRone (BUSPAR) 15 MG tablet, Take 15 mg by mouth 2 (two) times daily., Disp: , Rfl:  .  citalopram (CELEXA) 40 MG tablet, Take 40 mg by mouth daily., Disp: , Rfl:  .  clonazePAM (KLONOPIN) 0.5 MG tablet, Take 1 tablet (0.5 mg total)  by mouth 3 (three) times daily as needed., Disp: 60 tablet, Rfl: 0 .  dexamethasone (DECADRON) 4 MG tablet, Take 2 tablets (8 mg total) by mouth daily. Start the day after chemotherapy for 2 days., Disp: 30 tablet, Rfl: 1 .  lidocaine-prilocaine (EMLA) cream, Apply to affected area once, Disp: 30 g, Rfl: 3 .  acetaminophen (TYLENOL) 325 MG tablet, , Disp: , Rfl:  .  albuterol (VENTOLIN HFA) 108 (90 Base) MCG/ACT inhaler, Inhale 1-2 puffs into the lungs every 6 (six) hours as needed for shortness of breath or wheezing. (Patient not taking: Reported on 12/24/2020), Disp: , Rfl:  .  Ascorbic Acid (SUPER C COMPLEX PO), Take 1 capsule by mouth daily. (Patient not taking: No sig reported), Disp: , Rfl:  .  ibuprofen (ADVIL) 200 MG tablet, , Disp: , Rfl:  .  LORazepam (ATIVAN) 0.5 MG tablet, Take 1 tablet (0.5 mg total) by mouth every 6 (six) hours as needed (Nausea or vomiting). (Patient not taking: No sig reported), Disp: 30 tablet, Rfl: 0 .  norethindrone-ethinyl estradiol (LOESTRIN FE)  1-20 MG-MCG tablet, Take 1 tablet by mouth daily., Disp: , Rfl:  .  ondansetron (ZOFRAN) 8 MG tablet, Take 1 tablet (8 mg total) by mouth 2 (two) times daily as needed for refractory nausea / vomiting. Start on day 3 after chemo. (Patient not taking: No sig reported), Disp: 30 tablet, Rfl: 1 .  prochlorperazine (COMPAZINE) 10 MG tablet, Take 1 tablet (10 mg total) by mouth every 6 (six) hours as needed (Nausea or vomiting). (Patient not taking: No sig reported), Disp: 30 tablet, Rfl: 1 No current facility-administered medications for this visit.  Facility-Administered Medications Ordered in Other Visits:  .  CARBOplatin (PARAPLATIN) 230 mg in sodium chloride 0.9 % 250 mL chemo infusion, 230 mg, Intravenous, Once, Sindy Guadeloupe, MD, Last Rate: 546 mL/hr at 12/24/20 1339, 230 mg at 12/24/20 1339 .  heparin lock flush 100 unit/mL, 500 Units, Intracatheter, Once PRN, Sindy Guadeloupe, MD .  sodium chloride flush (NS) 0.9 %  injection 10 mL, 10 mL, Intracatheter, PRN, Sindy Guadeloupe, MD  Physical exam:  Vitals:   12/24/20 0951  BP: 108/72  Pulse: 74  Resp: 20  Temp: 98.1 F (36.7 C)  TempSrc: Tympanic  SpO2: 100%  Weight: 189 lb 6.4 oz (85.9 kg)   Physical Exam Constitutional:      General: She is not in acute distress. Cardiovascular:     Rate and Rhythm: Normal rate and regular rhythm.     Heart sounds: Normal heart sounds.  Pulmonary:     Effort: Pulmonary effort is normal.     Breath sounds: Normal breath sounds.  Abdominal:     General: Bowel sounds are normal.     Palpations: Abdomen is soft.  Skin:    General: Skin is warm and dry.  Neurological:     Mental Status: She is alert and oriented to person, place, and time.      CMP Latest Ref Rng & Units 12/24/2020  Glucose 70 - 99 mg/dL 87  BUN 6 - 20 mg/dL 16  Creatinine 0.44 - 1.00 mg/dL 0.71  Sodium 135 - 145 mmol/L 135  Potassium 3.5 - 5.1 mmol/L 3.9  Chloride 98 - 111 mmol/L 102  CO2 22 - 32 mmol/L 26  Calcium 8.9 - 10.3 mg/dL 8.9  Total Protein 6.5 - 8.1 g/dL 7.1  Total Bilirubin 0.3 - 1.2 mg/dL 0.7  Alkaline Phos 38 - 126 U/L 59  AST 15 - 41 U/L 30  ALT 0 - 44 U/L 37   CBC Latest Ref Rng & Units 12/24/2020  WBC 4.0 - 10.5 K/uL 6.5  Hemoglobin 12.0 - 15.0 g/dL 13.2  Hematocrit 36.0 - 46.0 % 39.0  Platelets 150 - 400 K/uL 244    No images are attached to the encounter.  DG Chest 1 View  Result Date: 12/13/2020 CLINICAL DATA:  Port-A-Cath insertion EXAM: CHEST  1 VIEW COMPARISON:  None. FINDINGS: Port-A-Cath tip is in the superior vena cava. No pneumothorax. Lungs are clear. Heart size and pulmonary vascularity are normal. No adenopathy. No bone lesions. IMPRESSION: Port-A-Cath tip in superior vena cava. No pneumothorax. Lungs clear. Cardiac silhouette normal. Electronically Signed   By: Lowella Grip III M.D.   On: 12/13/2020 13:59   NM Bone Scan Whole Body  Result Date: 12/12/2020 CLINICAL DATA:  Breast cancer,  initial staging examination EXAM: NUCLEAR MEDICINE WHOLE BODY BONE SCAN TECHNIQUE: Whole body anterior and posterior images were obtained approximately 3 hours after intravenous injection of radiopharmaceutical. RADIOPHARMACEUTICALS:  22.737 mCi  Technetium-14m MDP IV COMPARISON:  None. FINDINGS: Normal distribution of radiotracer within the visualized axial and appendicular skeleton. No evidence of osseous metastatic disease. Mild soft tissue uptake within the breast tissue bilaterally. Otherwise normal soft tissue distribution. Normal uptake and excretion within the kidneys and bladder. IMPRESSION: No evidence of osseous metastatic disease. Electronically Signed   By: Fidela Salisbury MD   On: 12/12/2020 23:21   CT CHEST ABDOMEN PELVIS W CONTRAST  Result Date: 12/17/2020 CLINICAL DATA:  Initial staging of left breast cancer EXAM: CT CHEST, ABDOMEN, AND PELVIS WITH CONTRAST TECHNIQUE: Multidetector CT imaging of the chest, abdomen and pelvis was performed following the standard protocol during bolus administration of intravenous contrast. CONTRAST:  156mL OMNIPAQUE IOHEXOL 300 MG/ML  SOLN COMPARISON:  MRI breast 12/16/2020 FINDINGS: CT CHEST FINDINGS Cardiovascular: Right Port-A-Cath tip: SVC. Mediastinum/Nodes: Left axillary node with parenchymal thickness of 0.6 cm noted (1.1 cm if the fatty hilum is included), image 11 series 2. On image 14 of series 2, a lymph node with parenchymal thickness of 0.6 cm is observed (1.0 cm if the fatty hilum is included). Right axillary node with parenchymal thickness is 0.4 cm (0.8 cm if the fatty hilum is included), image 15 series 2. Right paratracheal lymph node 0.6 cm short axis on image 22 series 2, within normal size limits. Small distal paraesophageal and periaortic lymph nodes are not pathologically enlarged this time. Left lower paratracheal lymph node 0.7 cm in short axis. Lungs/Pleura: Mild biapical pleuroparenchymal scarring. 2 mm calcified right lower lobe  pulmonary nodule on image 73 series 4, likely a granuloma. Similar 0.3 cm superior segment right lower lobe nodule on image 62 series 4, calcified and likely a chronic benign granuloma. Small granulomas are present in the superior segment left lower lobe (image 56 series 4) and in the left upper lobe (image 65, series 4). Non-calcified nodules: 0.5 by 0.2 cm left upper lobe nodule on image 40 series 4. 0.3 cm left lower lobe basilar nodule on image 132 series 4. Musculoskeletal: 0.6 by 0.4 cm lucent lesion medially in the left first rib shown on image 111 series 6 and image 13 series 4. This might be a hemangioma or similar benign lesion, but the lesion is essentially nonspecific primarily due to small size. Small locules of gas along the right lateral breast likely from recent biopsy. A overlying compresses in place. There is a clip along a left upper breast mass. CT ABDOMEN PELVIS FINDINGS Hepatobiliary: Unremarkable Pancreas: Unremarkable Spleen: Unremarkable Adrenals/Urinary Tract: Unremarkable Stomach/Bowel: Prominent stool throughout the colon favors constipation. Vascular/Lymphatic: Right inguinal node 1.1 cm in short axis on image 134 series 2. Separate right inguinal node 1.2 cm in short axis, image 137 series 2. These are not overtly pathologically enlarged for this location but are somewhat asymmetric compared to the contralateral side. Right external iliac node 0.7 cm in short axis, image 122 series 2. Small mesenteric lymph nodes are present. Reproductive: Unremarkable Other: No supplemental non-categorized findings. Musculoskeletal: Suspected mild disc bulge L5-S1. IMPRESSION: 1. Mildly enlarged right inguinal lymph nodes, nonspecific. Left upper breast mass, with minimal asymmetric prominence of left axillary lymph nodes compared to the right, although not overtly pathologically enlarged by size criteria. 2. Upper normal sized and slightly asymmetric right inguinal lymph nodes, surveillance suggested.  3. Small locules of gas along the right lateral breast likely from recent biopsy. 4. Several tiny pulmonary nodules are present. Most of these are calcified compatible with benign old granulomatous disease. A couple are noncalcified  and merit surveillance. 5. 0.6 by 0.4 cm lucent lesion medially in the left first rib, probably a hemangioma or similar benign lesion, but the lesion is essentially nonspecific primarily due to small size and merits surveillance. 6. Prominent stool throughout the colon favors constipation. 7. Suspected mild disc bulge L5-S1. Electronically Signed   By: Van Clines M.D.   On: 12/17/2020 09:01   MR BREAST BILATERAL W WO CONTRAST INC CAD  Result Date: 12/06/2020 CLINICAL DATA:  Newly diagnosed left breast invasive mammary carcinoma and metastatic left axillary lymph node. LABS:  None obtained on site today. EXAM: BILATERAL BREAST MRI WITH AND WITHOUT CONTRAST TECHNIQUE: Multiplanar, multisequence MR images of both breasts were obtained prior to and following the intravenous administration of 7.5 ml of Gadavist Three-dimensional MR images were rendered by post-processing of the original MR data on an independent workstation. The three-dimensional MR images were interpreted, and findings are reported in the following complete MRI report for this study. Three dimensional images were evaluated at the independent interpreting workstation using the DynaCAD thin client. COMPARISON:  Recent mammogram, ultrasound and biopsy examinations. FINDINGS: Breast composition: c. Heterogeneous fibroglandular tissue. Background parenchymal enhancement: Minimal. Right breast: Clumped and linear non mass enhancement in the posterior aspect of the upper outer quadrant of the right breast measuring 4.1 cm in AP diameter on image number 81 series 14, 0.9 cm in width on image number 83 series 14 and 2.3 cm in cephalo caudal dimension in the sagittal plane. This has low grade enhancement with persistent  kinetics. Left breast: Irregular enhancing mass with a central nonenhancing component in the posterior aspect of the 12 o'clock position of the left breast, containing a biopsy marker clip artifact. This corresponds to the recently biopsied invasive mammary carcinoma. On image number 78 series 10, this measures 2.5 x 2.3 cm in maximum dimensions. There is a linear component extending 2.3 cm anteriorly from the medial aspect of this mass. The mass and the anterior linear extension combined measure 4.3 cm. The mass measures 3.9 cm in cephalo caudal dimension in the sagittal plane. The mass and anterior linear extension have a mixture of enhancement kinetics, including rapid wash-in/washout. There is also a linear area of clumped, non mass enhancement in the posterior aspect of the lower outer quadrant of the left breast. This measures 2.6 x 0.9 cm on image number 49 series 10 and 0.4 cm in cephalo caudal dimension in the sagittal plane. This also has a mixture of enhancement kinetics, including rapid wash-in/washout. Lymph nodes: Post biopsy changes involving a left axillary lymph node with mild cortical thickening, corresponding to the recently biopsied metastatic left axillary lymph node. There is a smaller, similar appearing lymph node more posteriorly, corresponding the 2nd suspicious lymph node seen at ultrasound. There are 2 mildly enlarged left internal mammary lymph nodes. The largest is on image number 35 series 3, measuring 8 x 5 mm. There are 3 right axillary node with mild cortical thickening. Ancillary findings:  None. IMPRESSION: 1. 4.3 x 3.9 x 2.3 cm biopsy-proven invasive mammary carcinoma in the 12 o'clock position of the left breast, posteriorly. This includes a 2.3 cm linear component extending anteriorly. 2. 2.6 x 0.9 x 0.4 cm area of linear, clumped, non mass enhancement in the posterior aspect of the lower outer quadrant of the left breast. This is suspicious for additional malignancy. 3.  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 4. 4.1 x 2.3 x 0.9 cm area  of clumped and linear non mass enhancement in the posterior aspect of the upper-outer quadrant of the right breast suspicious for malignancy. RECOMMENDATION: 1. MR guided core needle biopsy of the 2.6 cm area of linear, clumped non mass enhancement in the posterior aspect of the lower outer quadrant of the left breast. 2. MR guided core needle biopsy of the anterior extent of the 2.3 cm linear component extending anteriorly from the biopsy proven invasive mammary carcinoma in the 12 o'clock position of the left breast. 3. MR guided core needle biopsy of the 4.1 cm area of clumped and linear non mass enhancement in the posterior upper outer right breast. 4. Right axillary ultrasound. BI-RADS CATEGORY  4: Suspicious. Electronically Signed   By: Claudie Revering M.D.   On: 12/06/2020 16:47   DG C-Arm 1-60 Min-No Report  Result Date: 12/13/2020 Fluoroscopy was utilized by the requesting physician.  No radiographic interpretation.   MM CLIP PLACEMENT LEFT  Result Date: 11/26/2020 CLINICAL DATA:  Post biopsy mammogram of the left breast for clip placement. EXAM: DIAGNOSTIC LEFT MAMMOGRAM POST ULTRASOUND BIOPSY COMPARISON:  Previous exam(s). FINDINGS: Mammographic images were obtained following ultrasound guided biopsy of a left breast mass and a left axillary lymph node. The biopsy marking clips are in expected position at the sites of biopsy. IMPRESSION: 1. Appropriate positioning of the vision shaped biopsy marking clip at the site of biopsy in the left breast mass at 12 o'clock. 2. Appropriate positioning of the HydroMARK clip at the site of the lymph node in the left axilla. Final Assessment: Post Procedure Mammograms for Marker Placement Electronically Signed   By: Ammie Ferrier M.D.   On: 11/26/2020 09:19   MM CLIP PLACEMENT RIGHT  Result Date: 12/16/2020 CLINICAL  DATA:  Evaluate BARBELL clip placement following MR guided RIGHT breast biopsy. EXAM: DIAGNOSTIC RIGHT MAMMOGRAM POST MRI BIOPSY COMPARISON:  Previous exam(s). FINDINGS: The patient presented [facility] clip views. Mammographic images were obtained following MR guided biopsy of posterior UPPER-OUTER RIGHT breast non masslike enhancement. The BARBELL biopsy marking clip is in expected position at the site of biopsy. IMPRESSION: Appropriate positioning of the BARBELL shaped biopsy marking clip at the site of biopsy in the UPPER OUTER RIGHT breast. Final Assessment: Post Procedure Mammograms for Marker Placement Electronically Signed   By: Margarette Canada M.D.   On: 12/16/2020 13:36   [place] LT BREAST BX W LOC DEV 1ST LESION IMG BX SPEC US GUIDE  Addendum Date: 12/02/2020   ADDENDUM REPORT: 11/29/2020 15:14 ADDENDUM: PATHOLOGY revealed: Site A. BREAST, LEFT AT 12:00, 6 CM FROM THE NIPPLE; ULTRASOUND-GUIDED CORE NEEDLE BIOPSY: - INVASIVE MAMMARY CARCINOMA, NO SPECIAL TYPE. Size of invasive carcinoma: At least 13 mm in this sample. Grade 3. Ductal carcinoma in situ: Not identified. Lymphovascular invasion: Not identified. Pathology results are CONCORDANT with imaging findings, per Dr. Ammie Ferrier. PATHOLOGY revealed: Site B. LYMPH NODE, LEFT AXILLARY; ULTRASOUND-GUIDED CORE NEEDLE BIOPSY: - FRAGMENTS OF LYMPH NODE INVOLVED BY MACRO METASTATIC CARCINOMA, 10 MM IN GREATEST EXTENT, SUSPICIOUS FOR EXTRACAPSULAR EXTENSION. Pathology results are CONCORDANT with imaging findings, per Dr. Ammie Ferrier. Pathology results and recommendations below were discussed with patient by telephone on 11/28/2020. Patient reported biopsy site within normal limits with slight tenderness at the site. Post biopsy care instructions were reviewed, questions were answered and my direct phone number was provided to patient. Patient was instructed to call [facility] if any concerns or questions arise  related to the biopsy. Recommendations:  1. Surgical and oncological consultation: Request for surgical and oncological consultation relayed to Al Pimple RN and Tanya Nones RN at [facility] by Electa Sniff RN on 11/27/2020. 2. Consider doing skin punch biopsy of region of thickening in the lower-inner quadrant of LEFT breast, if this would alter clinical management. Al Pimple RN stated patient has upcoming appointment with medical oncologist (Dr. Randa Evens) who will address the need for skin punch biopsy. Pathology results reported by Electa Sniff RN on 11/29/2020. Electronically Signed   By: Ammie Ferrier M.D.   On: 11/29/2020 15:14   Result Date: 12/02/2020 CLINICAL DATA:  30 year old female presenting for ultrasound-guided biopsy of a left breast mass and left axillary lymph node. EXAM: ULTRASOUND GUIDED LEFT BREAST CORE NEEDLE BIOPSY COMPARISON:  Previous exam(s). PROCEDURE: I met with the patient and we discussed the procedure of ultrasound-guided biopsy, including benefits and alternatives. We discussed the high likelihood of a successful procedure. We discussed the risks of the procedure, including infection, bleeding, tissue injury, clip migration, and inadequate sampling. Informed written consent was given. The usual time-out protocol was performed immediately prior to the procedure. Lesion quadrant: Upper outer quadrant Using sterile technique and 1% Lidocaine as local anesthetic, under direct ultrasound visualization, a 14 gauge spring-loaded device was used to perform biopsy of a mass in the left breast at 12 o'clock using a lateral approach. At the conclusion of the procedure a vision tissue marker clip was deployed into the biopsy cavity. -------------------------------------------------- Lesion quadrant: Left axilla Using sterile technique and 1% Lidocaine as local anesthetic, under direct ultrasound visualization, a 14 gauge spring-loaded device was used to perform biopsy  of a left axillary lymph node using an inferior approach. At the conclusion of the procedure a HydroMARK tissue marker clip was deployed into the biopsy cavity. Follow up 2 view mammogram was performed and dictated separately. IMPRESSION: 1. Ultrasound guided biopsy of a left breast mass at 12 o'clock. No apparent complications. 2. Ultrasound-guided biopsy of a left axillary lymph node. No apparent complications. Electronically Signed: By: Ammie Ferrier M.D. On: 11/26/2020 09:13   [place] LT BREAST BX W LOC DEV EA ADD LESION IMG BX SPEC US GUIDE  Addendum Date: 12/02/2020   ADDENDUM REPORT: 11/29/2020 15:14 ADDENDUM: PATHOLOGY revealed: Site A. BREAST, LEFT AT 12:00, 6 CM FROM THE NIPPLE; ULTRASOUND-GUIDED CORE NEEDLE BIOPSY: - INVASIVE MAMMARY CARCINOMA, NO SPECIAL TYPE. Size of invasive carcinoma: At least 13 mm in this sample. Grade 3. Ductal carcinoma in situ: Not identified. Lymphovascular invasion: Not identified. Pathology results are CONCORDANT with imaging findings, per Dr. Ammie Ferrier. PATHOLOGY revealed: Site B. LYMPH NODE, LEFT AXILLARY; ULTRASOUND-GUIDED CORE NEEDLE BIOPSY: - FRAGMENTS OF LYMPH NODE INVOLVED BY MACRO METASTATIC CARCINOMA, 10 MM IN GREATEST EXTENT, SUSPICIOUS FOR EXTRACAPSULAR EXTENSION. Pathology results are CONCORDANT with imaging findings, per Dr. Ammie Ferrier. Pathology results and recommendations below were discussed with patient by telephone on 11/28/2020. Patient reported biopsy site within normal limits with slight tenderness at the site. Post biopsy care instructions were reviewed, questions were answered and my direct phone number was provided to patient. Patient was instructed to call [facility] if any concerns or questions arise related to the biopsy. Recommendations: 1. Surgical and oncological consultation: Request for surgical and oncological consultation relayed to Al Pimple RN and Tanya Nones RN at [facility] by Electa Sniff  RN on 11/27/2020. 2. Consider doing skin punch biopsy of region of thickening in the lower-inner quadrant of LEFT breast, if  this would alter clinical management. Al Pimple RN stated patient has upcoming appointment with medical oncologist (Dr. Randa Evens) who will address the need for skin punch biopsy. Pathology results reported by Electa Sniff RN on 11/29/2020. Electronically Signed   By: Ammie Ferrier M.D.   On: 11/29/2020 15:14   Result Date: 12/02/2020 CLINICAL DATA:  30 year old female presenting for ultrasound-guided biopsy of a left breast mass and left axillary lymph node. EXAM: ULTRASOUND GUIDED LEFT BREAST CORE NEEDLE BIOPSY COMPARISON:  Previous exam(s). PROCEDURE: I met with the patient and we discussed the procedure of ultrasound-guided biopsy, including benefits and alternatives. We discussed the high likelihood of a successful procedure. We discussed the risks of the procedure, including infection, bleeding, tissue injury, clip migration, and inadequate sampling. Informed written consent was given. The usual time-out protocol was performed immediately prior to the procedure. Lesion quadrant: Upper outer quadrant Using sterile technique and 1% Lidocaine as local anesthetic, under direct ultrasound visualization, a 14 gauge spring-loaded device was used to perform biopsy of a mass in the left breast at 12 o'clock using a lateral approach. At the conclusion of the procedure a vision tissue marker clip was deployed into the biopsy cavity. -------------------------------------------------- Lesion quadrant: Left axilla Using sterile technique and 1% Lidocaine as local anesthetic, under direct ultrasound visualization, a 14 gauge spring-loaded device was used to perform biopsy of a left axillary lymph node using an inferior approach. At the conclusion of the procedure a HydroMARK tissue marker clip was deployed into the biopsy cavity. Follow up 2 view mammogram was performed and dictated separately.  IMPRESSION: 1. Ultrasound guided biopsy of a left breast mass at 12 o'clock. No apparent complications. 2. Ultrasound-guided biopsy of a left axillary lymph node. No apparent complications. Electronically Signed: By: Ammie Ferrier M.D. On: 11/26/2020 09:13   MR RT BREAST BX W LOC DEV 1ST LESION IMAGE BX SPEC MR GUIDE  Addendum Date: 12/23/2020   ADDENDUM REPORT: 12/18/2020 13:23 ADDENDUM: Pathology revealed BENIGN BREAST TISSUE of the RIGHT breast, outer. This was found to be concordant by Dr. Hassan Rowan. Pathology results were discussed with the patient by telephone. The patient reported doing well after the biopsy with tenderness and bleeding at the site. Post biopsy instructions and care were reviewed and questions were answered. The patient was encouraged to call [facility] for any additional concerns. My direct phone number was provided. The patient has a recent diagnosis of LEFT breast cancer and should follow her outlined treatment plan. If breast conservation is being considered, recommendation for MR guided core needle biopsy of the 2.6 cm area of linear, clumped non mass enhancement in the posterior aspect of the lower outer quadrant of the LEFT breast, and MR guided core needle biopsy of the anterior extent of the 2.3 cm linear component extending anteriorly from the biopsy proven invasive mammary carcinoma in the 12 o'clock position of the LEFT breast. Dr. Randa Evens and Al Pimple, RN Oncology Navigator with [facility] were notified of biopsy results and recommendations via EPIC message on December 18, 2020. Pathology results reported by Terie Purser, RN on 12/18/2020. Electronically Signed   By: Margarette Canada M.D.   On: 12/18/2020 13:23   Result Date: 12/23/2020 CLINICAL DATA:  30 year old female for tissue sampling of 4.1 cm area of UPPER-OUTER RIGHT breast non masslike enhancement. Recent diagnosis of LEFT breast cancer. EXAM: MRI GUIDED CORE  NEEDLE BIOPSY OF THE RIGHT BREAST TECHNIQUE: Multiplanar, multisequence MR imaging of  the RIGHT breast was performed both before and after administration of intravenous contrast. CONTRAST:  58mL GADAVIST GADOBUTROL 1 MMOL/ML IV SOLN COMPARISON:  Previous exams. FINDINGS: I met with the patient, and we discussed the procedure of MRI guided biopsy, including risks, benefits, and alternatives. Specifically, we discussed the risks of infection, bleeding, tissue injury, clip migration, and inadequate sampling. Informed, written consent was given. The usual time out protocol was performed immediately prior to the procedure. Using sterile technique, 1% Lidocaine, MRI guidance, and a 9 gauge vacuum assisted device, biopsy was performed of the posterior UPPER-OUTER RIGHT breast non masslike enhancement using a LATERAL approach. At the conclusion of the procedure, a BARBELL tissue marker clip was deployed into the biopsy cavity. Follow-up 2-view mammogram was performed and dictated separately. IMPRESSION: MRI guided biopsy of RIGHT breast biopsy. No apparent complications. The patient states she is unsure if she will undergo LEFT mastectomy, and there are 2 other LEFT breast areas on recent breast MR that were recommended to be sampled if breast conservation is desired. Only the RIGHT breast was sampled today as we only had an order for the MR guided RIGHT breast biopsy. If LEFT breast conservation will be considered, then MR guided biopsies of the 2 other areas in the LEFT breast is recommended (as on prior report). Electronically Signed: By: Margarette Canada M.D. On: 12/16/2020 10:28     Assessment and plan- Patient is a 30 y.o. 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  She will directly proceed for cycle 3 next week and I  will see her back in 2 weeks for cycle 4.  She will receive Keytruda every 3 weeks.   Discussed the results of right breast biopsy which was concordant and not diagnostic of malignancy.  Regardless patient is leaning towards a bilateral mastectomy at this time  She has an upcoming genetics appointment as well.   Visit Diagnosis 1. Encounter for antineoplastic chemotherapy   2. Invasive carcinoma of breast ([place])      Dr. Randa Evens, MD, MPH [facility] [phone] 12/24/2020 1:42 PM

## 2020-12-25 LAB — T4: T4, Total: 6.7 ug/dL (ref 4.5–12.0)

## 2020-12-26 ENCOUNTER — Inpatient Hospital Stay: Payer: 59 | Admitting: Licensed Clinical Social Worker

## 2020-12-26 ENCOUNTER — Inpatient Hospital Stay: Payer: 59

## 2020-12-26 ENCOUNTER — Encounter: Payer: Self-pay | Admitting: Licensed Clinical Social Worker

## 2020-12-26 DIAGNOSIS — Z809 Family history of malignant neoplasm, unspecified: Secondary | ICD-10-CM

## 2020-12-26 DIAGNOSIS — C50919 Malignant neoplasm of unspecified site of unspecified female breast: Secondary | ICD-10-CM

## 2020-12-26 NOTE — Progress Notes (Signed)
REFERRING PROVIDER: Sindy Guadeloupe, MD [place],  [place] [address]  PRIMARY PROVIDER:  Juluis Pitch, MD  PRIMARY REASON FOR VISIT:  1. Invasive carcinoma of breast ([place])   2. Family history of cancer      HISTORY OF PRESENT ILLNESS:   Ms. Hornbrook, a 30 y.o. female, was seen for a [place] cancer genetics consultation at the request of Dr. Janese Banks due to a personal and family history of cancer.  Ms. Tootle presents to clinic today to discuss the possibility of a hereditary predisposition to cancer, genetic testing, and to further clarify her future cancer risks, as well as potential cancer risks for family members.   In 2022, at the age of 62, Ms. Prewitt was diagnosed with invasive carcinoma of the breast, triple negative. Patient is currently on chemotherapy and is considering bilateral mastectomy.   CANCER HISTORY:  Oncology History  Invasive carcinoma of breast ([place])  12/03/2020 Initial Diagnosis   Invasive carcinoma of breast ([place])   12/17/2020 -  Chemotherapy    Patient is on Treatment Plan: BREAST PEMBROLIZUMAB + CARBOPLATIN D1,8,15+ PACLITAXEL D1,8,15 Q21D X 4 CYCLES / PEMBROLIZUMAB + AC Q21D X 4 CYCLES      12/17/2020 Cancer Staging   Staging form: Breast, AJCC 8th Edition - Clinical stage from 12/17/2020: Stage IIIB (cT2, cN1, cM0, G3, ER-, PR-, HER2-) - Signed by Sindy Guadeloupe, MD on 12/19/2020 Histologic grading system: 3 grade system      RISK FACTORS:  Menarche was at age 5.  Ovaries intact: yes.  Hysterectomy: no.  Menopausal status: premenopausal.  Patient does note exposure to lead paint.  Past Medical History:  Diagnosis Date  . Anxiety   . Asthma   . Breast cancer ([place])   . Depression   . Family history of cancer   . Varicose veins of bilateral lower extremities with pain     Past Surgical History:  Procedure Laterality Date  . APPENDECTOMY  2018  . BREAST BIOPSY Left 11/26/2020   vision 12:00 6cmfn path pending  . BREAST  BIOPSY Left 11/26/2020   LN t3 marler path pending  . PORTACATH PLACEMENT Right 12/13/2020   Procedure: INSERTION PORT-A-CATH;  Surgeon: Herbert Pun, MD;  Location: [facility] ORS;  Service: General;  Laterality: Right;    Social History   Socioeconomic History  . Marital status: Married    Spouse name: Not on file  . Number of children: Not on file  . Years of education: Not on file  . Highest education level: Not on file  Occupational History  . Not on file  Tobacco Use  . Smoking status: Never Smoker  . Smokeless tobacco: Never Used  Vaping Use  . Vaping Use: Never used  Substance and Sexual Activity  . Alcohol use: Yes    Comment: occassinally   . Drug use: Yes    Types: Marijuana  . Sexual activity: Yes  Other Topics Concern  . Not on file  Social History Narrative  . Not on file   Social Determinants of Health   Financial Resource Strain: Not on file  Food Insecurity: Not on file  Transportation Needs: Not on file  Physical Activity: Not on file  Stress: Not on file  Social Connections: Not on file     FAMILY HISTORY:  We obtained a detailed, 4-generation family history.  Significant diagnoses are listed below: Family History  Problem Relation Age of Onset  . Diabetes Father   . Varicose Veins  Father   . Diabetes Paternal Aunt   . Uterine cancer Paternal Aunt        precancerous  . Diabetes Paternal Grandmother   . Uterine cancer Paternal Grandmother        precancerous  . Thyroid disease Mother   . Thyroid disease Maternal Aunt   . Thyroid disease Maternal Grandmother   . Cancer Other        stomach vs ovarian/cervical  . Cancer Paternal Great-grandmother        unk   Ms. Konz has a brother and a sister, no cancers.   Ms. Withem mother is living at 22. Patient had 2 maternal uncles, 2 maternal aunts, no cancers. No known cancers on this side of the family.  Ms. Wyss father is 42, no cancers. Patient has 1 paternal aunt, 2  paternal uncles. Her aunt had a hysterectomy for precancerous cells in her uterus. Paternal grandfather had esophageal cancer and history of smoking. Grandmother also had hysterectomy for precancerous cells in her uterus. Grandmother's sister had cancer, either stomach or ovarian, and their mother (patient's great grandmother) also had cancer.   Ms. Galas is unaware of previous family history of genetic testing for hereditary cancer risks. Patient's maternal ancestors are of English/Irish/European descent, and paternal ancestors are of Scandinavian/Norwegian/European  descent. There is no reported Ashkenazi Jewish ancestry. There is no known consanguinity.    GENETIC COUNSELING ASSESSMENT: Ms. Keegan is a 30 y.o. female with a personal history of triple negative breast cancer which is somewhat suggestive of a hereditary cancer syndrome and predisposition to cancer. We, therefore, discussed and recommended the following at today's visit.   DISCUSSION: We discussed that approximately 5-10% of breast cancer is hereditary. Most cases of hereditary breast cancer are associated with BRCA1/BRCA2 genes, although there are other genes associated with hereditary cancer as well.  We discussed that testing is beneficial for several reasons including surgical decision-making for breast cancer, knowing about other cancer risks, identifying potential screening and risk-reduction options that may be appropriate, and to understand if other family members could be at risk for cancer and allow them to undergo genetic testing.   We reviewed the characteristics, features and inheritance patterns of hereditary cancer syndromes. We also discussed genetic testing, including the appropriate family members to test, the process of testing, insurance coverage and turn-around-time for results. We discussed the implications of a negative, positive and/or variant of uncertain significant result. We recommended Ms. Bertsch pursue  genetic testing for the [facility] CancerNext-Expanded+RNA  gene panel.   The CancerNext-Expanded + RNAinsight gene panel offered by [employer] and includes sequencing and rearrangement analysis for the following 77 genes: IP, ALK, APC*, ATM*, AXIN2, BAP1, BARD1, BLM, BMPR1A, BRCA1*, BRCA2*, BRIP1*, CDC73, CDH1*,CDK4, CDKN1B, CDKN2A, CHEK2*, CTNNA1, DICER1, FANCC, FH, FLCN, GALNT12, KIF1B, LZTR1, MAX, MEN1, MET, MLH1*, MSH2*, MSH3, MSH6*, MUTYH*, NBN, NF1*, NF2, NTHL1, PALB2*, PHOX2B, PMS2*, POT1, PRKAR1A, PTCH1, PTEN*, RAD51C*, RAD51D*,RB1, RECQL, RET, SDHA, SDHAF2, SDHB, SDHC, SDHD, SMAD4, SMARCA4, SMARCB1, SMARCE1, STK11, SUFU, TMEM127, TP53*,TSC1, TSC2, VHL and XRCC2 (sequencing and deletion/duplication); EGFR, EGLN1, HOXB13, KIT, MITF, PDGFRA, POLD1 and POLE (sequencing only); EPCAM and GREM1 (deletion/duplication only).  Based on Ms. Shader's personal and family history of cancer, she meets medical criteria for genetic testing. Despite that she meets criteria, she may still have an out of pocket cost. We discussed that if her out of pocket cost for testing is over $100, the laboratory will call and confirm whether she wants to proceed with testing.  If the out of pocket cost of testing is less than $100 she will be billed by the genetic testing laboratory.   PLAN: After considering the risks, benefits, and limitations, Ms. Micheals provided informed consent to pursue genetic testing and the blood sample was sent to [facility] for analysis of the CancerNext-Expanded+RNA panel. Results should be available within approximately 2-3 weeks' time, at which point they will be disclosed by telephone to Ms. Asbury, as will any additional recommendations warranted by these results. Ms. Nyland will receive a summary of her genetic counseling visit and a copy of her results once available. This information will also be available in Epic.   Ms. Manganelli questions were answered to her satisfaction  today. Our contact information was provided should additional questions or concerns arise. Thank you for the referral and allowing [place] to share in the care of your patient.   Faith Rogue, MS, [facility] Genetic Counselor [email or web address] Phone: [phone]  The patient was seen for a total of 40 minutes in face-to-face genetic counseling. Patient's husband [name] was also present.  Dr. Grayland Ormond was available for discussion regarding this case.   _______________________________________________________________________ For Office Staff:  Number of people involved in session: 2 Was an Intern/ student involved with case: no

## 2020-12-31 ENCOUNTER — Inpatient Hospital Stay: Payer: 59

## 2020-12-31 VITALS — BP 114/72 | HR 59 | Temp 98.6°F | Resp 16 | Wt 191.0 lb

## 2020-12-31 DIAGNOSIS — Z171 Estrogen receptor negative status [ER-]: Secondary | ICD-10-CM

## 2020-12-31 DIAGNOSIS — C50412 Malignant neoplasm of upper-outer quadrant of left female breast: Secondary | ICD-10-CM

## 2020-12-31 DIAGNOSIS — Z5112 Encounter for antineoplastic immunotherapy: Secondary | ICD-10-CM | POA: Diagnosis not present

## 2020-12-31 DIAGNOSIS — C50919 Malignant neoplasm of unspecified site of unspecified female breast: Secondary | ICD-10-CM

## 2020-12-31 LAB — CBC WITH DIFFERENTIAL/PLATELET
Abs Immature Granulocytes: 0.02 10*3/uL (ref 0.00–0.07)
Basophils Absolute: 0.1 10*3/uL (ref 0.0–0.1)
Basophils Relative: 2 %
Eosinophils Absolute: 0.4 10*3/uL (ref 0.0–0.5)
Eosinophils Relative: 8 %
HCT: 36 % (ref 36.0–46.0)
Hemoglobin: 12.1 g/dL (ref 12.0–15.0)
Immature Granulocytes: 0 %
Lymphocytes Relative: 33 %
Lymphs Abs: 1.5 10*3/uL (ref 0.7–4.0)
MCH: 30.2 pg (ref 26.0–34.0)
MCHC: 33.6 g/dL (ref 30.0–36.0)
MCV: 89.8 fL (ref 80.0–100.0)
Monocytes Absolute: 0.3 10*3/uL (ref 0.1–1.0)
Monocytes Relative: 7 %
Neutro Abs: 2.3 10*3/uL (ref 1.7–7.7)
Neutrophils Relative %: 50 %
Platelets: 234 10*3/uL (ref 150–400)
RBC: 4.01 MIL/uL (ref 3.87–5.11)
RDW: 12.7 % (ref 11.5–15.5)
WBC: 4.5 10*3/uL (ref 4.0–10.5)
nRBC: 0 % (ref 0.0–0.2)

## 2020-12-31 LAB — COMPREHENSIVE METABOLIC PANEL
ALT: 42 U/L (ref 0–44)
AST: 26 U/L (ref 15–41)
Albumin: 4.1 g/dL (ref 3.5–5.0)
Alkaline Phosphatase: 51 U/L (ref 38–126)
Anion gap: 8 (ref 5–15)
BUN: 19 mg/dL (ref 6–20)
CO2: 24 mmol/L (ref 22–32)
Calcium: 8.9 mg/dL (ref 8.9–10.3)
Chloride: 103 mmol/L (ref 98–111)
Creatinine, Ser: 0.68 mg/dL (ref 0.44–1.00)
GFR, Estimated: >60 mL/min (ref >60)
Glucose, Bld: 91 mg/dL (ref 70–99)
Potassium: 3.9 mmol/L (ref 3.5–5.1)
Sodium: 135 mmol/L (ref 135–145)
Total Bilirubin: 0.6 mg/dL (ref 0.3–1.2)
Total Protein: 6.9 g/dL (ref 6.5–8.1)

## 2020-12-31 MED ORDER — SODIUM CHLORIDE 0.9 % IV SOLN
10.0000 mg | Freq: Once | INTRAVENOUS | Status: AC
  Administered 2020-12-31: 10 mg via INTRAVENOUS
  Filled 2020-12-31: qty 10

## 2020-12-31 MED ORDER — PALONOSETRON HCL INJECTION 0.25 MG/5ML
0.2500 mg | Freq: Once | INTRAVENOUS | Status: AC
  Administered 2020-12-31: 0.25 mg via INTRAVENOUS
  Filled 2020-12-31: qty 5

## 2020-12-31 MED ORDER — DIPHENHYDRAMINE HCL 50 MG/ML IJ SOLN
50.0000 mg | Freq: Once | INTRAMUSCULAR | Status: AC
  Administered 2020-12-31: 50 mg via INTRAVENOUS
  Filled 2020-12-31: qty 1

## 2020-12-31 MED ORDER — SODIUM CHLORIDE 0.9 % IV SOLN
230.0000 mg | Freq: Once | INTRAVENOUS | Status: AC
  Administered 2020-12-31: 230 mg via INTRAVENOUS
  Filled 2020-12-31: qty 23

## 2020-12-31 MED ORDER — SODIUM CHLORIDE 0.9 % IV SOLN
80.0000 mg/m2 | Freq: Once | INTRAVENOUS | Status: AC
  Administered 2020-12-31: 162 mg via INTRAVENOUS
  Filled 2020-12-31: qty 27

## 2020-12-31 MED ORDER — HEPARIN SOD (PORK) LOCK FLUSH 100 UNIT/ML IV SOLN
500.0000 [IU] | Freq: Once | INTRAVENOUS | Status: AC | PRN
  Administered 2020-12-31: 500 [IU]
  Filled 2020-12-31: qty 5

## 2020-12-31 MED ORDER — FAMOTIDINE 20 MG IN NS 100 ML IVPB
20.0000 mg | Freq: Once | INTRAVENOUS | Status: AC
  Administered 2020-12-31: 20 mg via INTRAVENOUS
  Filled 2020-12-31: qty 100
  Filled 2020-12-31: qty 20

## 2020-12-31 MED ORDER — SODIUM CHLORIDE 0.9 % IV SOLN
Freq: Once | INTRAVENOUS | Status: AC
  Filled 2020-12-31: qty 250

## 2020-12-31 MED ORDER — HEPARIN SOD (PORK) LOCK FLUSH 100 UNIT/ML IV SOLN
INTRAVENOUS | Status: AC
  Filled 2020-12-31: qty 5

## 2021-01-02 ENCOUNTER — Other Ambulatory Visit: Payer: Self-pay | Admitting: Oncology

## 2021-01-07 ENCOUNTER — Inpatient Hospital Stay: Payer: 59 | Attending: Oncology

## 2021-01-07 ENCOUNTER — Inpatient Hospital Stay (HOSPITAL_BASED_OUTPATIENT_CLINIC_OR_DEPARTMENT_OTHER): Payer: 59 | Admitting: Oncology

## 2021-01-07 ENCOUNTER — Inpatient Hospital Stay: Payer: 59

## 2021-01-07 VITALS — BP 103/56 | HR 79 | Temp 99.0°F | Resp 20 | Wt 197.6 lb

## 2021-01-07 DIAGNOSIS — Z5111 Encounter for antineoplastic chemotherapy: Secondary | ICD-10-CM | POA: Insufficient documentation

## 2021-01-07 DIAGNOSIS — C50812 Malignant neoplasm of overlapping sites of left female breast: Secondary | ICD-10-CM | POA: Insufficient documentation

## 2021-01-07 DIAGNOSIS — Z79899 Other long term (current) drug therapy: Secondary | ICD-10-CM | POA: Diagnosis not present

## 2021-01-07 DIAGNOSIS — Z5112 Encounter for antineoplastic immunotherapy: Secondary | ICD-10-CM | POA: Insufficient documentation

## 2021-01-07 DIAGNOSIS — C50919 Malignant neoplasm of unspecified site of unspecified female breast: Secondary | ICD-10-CM | POA: Diagnosis not present

## 2021-01-07 DIAGNOSIS — C50412 Malignant neoplasm of upper-outer quadrant of left female breast: Secondary | ICD-10-CM

## 2021-01-07 DIAGNOSIS — Z5189 Encounter for other specified aftercare: Secondary | ICD-10-CM | POA: Insufficient documentation

## 2021-01-07 DIAGNOSIS — Z171 Estrogen receptor negative status [ER-]: Secondary | ICD-10-CM

## 2021-01-07 LAB — CBC WITH DIFFERENTIAL/PLATELET
Abs Immature Granulocytes: 0.05 10*3/uL (ref 0.00–0.07)
Basophils Absolute: 0.1 10*3/uL (ref 0.0–0.1)
Basophils Relative: 1 %
Eosinophils Absolute: 0.2 10*3/uL (ref 0.0–0.5)
Eosinophils Relative: 3 %
HCT: 33.6 % — ABNORMAL LOW (ref 36.0–46.0)
Hemoglobin: 11.6 g/dL — ABNORMAL LOW (ref 12.0–15.0)
Immature Granulocytes: 1 %
Lymphocytes Relative: 37 %
Lymphs Abs: 2.3 10*3/uL (ref 0.7–4.0)
MCH: 31 pg (ref 26.0–34.0)
MCHC: 34.5 g/dL (ref 30.0–36.0)
MCV: 89.8 fL (ref 80.0–100.0)
Monocytes Absolute: 0.4 10*3/uL (ref 0.1–1.0)
Monocytes Relative: 6 %
Neutro Abs: 3.2 10*3/uL (ref 1.7–7.7)
Neutrophils Relative %: 52 %
Platelets: 213 10*3/uL (ref 150–400)
RBC: 3.74 MIL/uL — ABNORMAL LOW (ref 3.87–5.11)
RDW: 13.1 % (ref 11.5–15.5)
WBC: 6.2 10*3/uL (ref 4.0–10.5)
nRBC: 0 % (ref 0.0–0.2)

## 2021-01-07 LAB — COMPREHENSIVE METABOLIC PANEL
ALT: 46 U/L — ABNORMAL HIGH (ref 0–44)
AST: 27 U/L (ref 15–41)
Albumin: 3.8 g/dL (ref 3.5–5.0)
Alkaline Phosphatase: 55 U/L (ref 38–126)
Anion gap: 8 (ref 5–15)
BUN: 20 mg/dL (ref 6–20)
CO2: 24 mmol/L (ref 22–32)
Calcium: 8.4 mg/dL — ABNORMAL LOW (ref 8.9–10.3)
Chloride: 104 mmol/L (ref 98–111)
Creatinine, Ser: 0.61 mg/dL (ref 0.44–1.00)
GFR, Estimated: >60 mL/min (ref >60)
Glucose, Bld: 88 mg/dL (ref 70–99)
Potassium: 4.1 mmol/L (ref 3.5–5.1)
Sodium: 136 mmol/L (ref 135–145)
Total Bilirubin: 0.4 mg/dL (ref 0.3–1.2)
Total Protein: 6.2 g/dL — ABNORMAL LOW (ref 6.5–8.1)

## 2021-01-07 MED ORDER — SODIUM CHLORIDE 0.9 % IV SOLN
10.0000 mg | Freq: Once | INTRAVENOUS | Status: AC
  Administered 2021-01-07: 10 mg via INTRAVENOUS
  Filled 2021-01-07: qty 10

## 2021-01-07 MED ORDER — PALONOSETRON HCL INJECTION 0.25 MG/5ML
0.2500 mg | Freq: Once | INTRAVENOUS | Status: AC
  Administered 2021-01-07: 0.25 mg via INTRAVENOUS
  Filled 2021-01-07: qty 5

## 2021-01-07 MED ORDER — SODIUM CHLORIDE 0.9% FLUSH
10.0000 mL | INTRAVENOUS | Status: DC | PRN
  Administered 2021-01-07: 10 mL via INTRAVENOUS
  Filled 2021-01-07: qty 10

## 2021-01-07 MED ORDER — SODIUM CHLORIDE 0.9 % IV SOLN
225.0000 mg | Freq: Once | INTRAVENOUS | Status: AC
  Administered 2021-01-07: 230 mg via INTRAVENOUS
  Filled 2021-01-07: qty 23

## 2021-01-07 MED ORDER — FAMOTIDINE 20 MG IN NS 100 ML IVPB
20.0000 mg | Freq: Once | INTRAVENOUS | Status: AC
  Administered 2021-01-07: 20 mg via INTRAVENOUS
  Filled 2021-01-07: qty 100
  Filled 2021-01-07: qty 20

## 2021-01-07 MED ORDER — SODIUM CHLORIDE 0.9 % IV SOLN
Freq: Once | INTRAVENOUS | Status: AC
  Filled 2021-01-07: qty 250

## 2021-01-07 MED ORDER — HEPARIN SOD (PORK) LOCK FLUSH 100 UNIT/ML IV SOLN
500.0000 [IU] | Freq: Once | INTRAVENOUS | Status: AC
  Administered 2021-01-07: 500 [IU] via INTRAVENOUS
  Filled 2021-01-07: qty 5

## 2021-01-07 MED ORDER — DIPHENHYDRAMINE HCL 50 MG/ML IJ SOLN
50.0000 mg | Freq: Once | INTRAMUSCULAR | Status: AC
  Administered 2021-01-07: 50 mg via INTRAVENOUS
  Filled 2021-01-07: qty 1

## 2021-01-07 MED ORDER — HEPARIN SOD (PORK) LOCK FLUSH 100 UNIT/ML IV SOLN
INTRAVENOUS | Status: AC
  Filled 2021-01-07: qty 5

## 2021-01-07 MED ORDER — SODIUM CHLORIDE 0.9 % IV SOLN
200.0000 mg | Freq: Once | INTRAVENOUS | Status: AC
  Administered 2021-01-07: 200 mg via INTRAVENOUS
  Filled 2021-01-07: qty 8

## 2021-01-07 MED ORDER — SODIUM CHLORIDE 0.9 % IV SOLN
80.0000 mg/m2 | Freq: Once | INTRAVENOUS | Status: AC
  Administered 2021-01-07: 162 mg via INTRAVENOUS
  Filled 2021-01-07: qty 27

## 2021-01-08 ENCOUNTER — Encounter: Payer: Self-pay | Admitting: Oncology

## 2021-01-08 NOTE — Progress Notes (Signed)
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 She sought medical attention recently after thinking it was a possible cyst for all this while.She underwent a diagnostic bilateral mammogram and ultrasound which showed a 2.7 cm mass at the 12 o'clock position 6 cm from the nipple. Ultrasound of the left axilla demonstrates 2 lymph nodes with mild thickened cortices of 4 mm. There is skin thickening on the lower inner quadrant of the left breast on mammography.Both the breast mass and the lymph node were biopsied and was positive for invasive mammary carcinoma grade 3. Lymph node was also suspicious for extracapsular extension.ER/PR and HER-2 negative  MRI showed irregular enhancing mass at the 12 o'clock position of the left breast measuring 2.5 x 2.3 cm and a linear component extending 2.3 cm anteriorly. The mass in the anterior linear extension combined measure 4.3 cm. 3.9 cm in the cephalocaudal dimension. Also an area of clumped non-mass enhancement in the posterior aspect of the lower quadrant of the left breast measuring 2.6 x 0.9 cm which looks suspicious. 2 enlarged left axillary lymph nodes and 2 mildly enlarged internal mammary lymph nodes. 4.3 cm clumped area of non-mass enhancement in the outer quadrant of the right breast. 3 right axillary lymph nodes with mild cortical thickening.  Patient underwent  biopsy of the right breast non-mass enhancement and that was negative for malignancy  CT scan showed mildly enlarged right inguinal lymph nodes nonspecific. Left upper breast mass with prominent left axillary lymph nodes. Subcentimeter pulmonary nodules which are calcified and compatible with benign old granulomatous disease. 0.6 5.4 cm lucent lesion in the left first rib possibly a hemangioma or benign lesion.   Interval history-patient had an episode of severe muscle cramping of her bilateral legs a couple of days ago which woke her up from her sleep.  It has not recurred since then.  She reports ongoing fatigue.  She has not returned back to work yet.  ECOG PS- 1 Pain scale- 0  Review of systems- Review of Systems  Constitutional: Positive for malaise/fatigue. Negative for chills, fever and weight loss.  HENT: Negative for congestion, ear discharge and nosebleeds.   Eyes: Negative for blurred vision.  Respiratory: Negative for cough, hemoptysis, sputum production, shortness of breath and wheezing.   Cardiovascular: Negative for chest pain, palpitations, orthopnea and claudication.  Gastrointestinal: Negative for abdominal pain, blood in stool, constipation, diarrhea, heartburn, melena, nausea and vomiting.  Genitourinary: Negative for dysuria, flank pain, frequency, hematuria and urgency.  Musculoskeletal: Negative for back pain, joint pain and myalgias.  Skin: Negative for rash.  Neurological: Negative for dizziness, tingling, focal weakness, seizures, weakness and headaches.  Endo/Heme/Allergies: Does not bruise/bleed easily.  Psychiatric/Behavioral: Negative for depression and suicidal ideas. The patient does not have insomnia.        Allergies  Allergen Reactions  . Amoxicillin     Other reaction(s): "too young to remember what they do to me"  . Sulfa Antibiotics  Other reaction(s): "too young to remember what they do to me"     Past Medical History:  Diagnosis  Date  . Anxiety   . Asthma   . Breast cancer ([place])   . Depression   . Family history of cancer   . Varicose veins of bilateral lower extremities with pain      Past Surgical History:  Procedure Laterality Date  . APPENDECTOMY  2018  . BREAST BIOPSY Left 11/26/2020   vision 12:00 6cmfn path pending  . BREAST BIOPSY Left 11/26/2020   LN t3 marler path pending  . PORTACATH PLACEMENT Right 12/13/2020   Procedure: INSERTION PORT-A-CATH;  Surgeon: Herbert Pun, MD;  Location: [facility] ORS;  Service: General;  Laterality: Right;    Social History   Socioeconomic History  . Marital status: Married    Spouse name: Not on file  . Number of children: Not on file  . Years of education: Not on file  . Highest education level: Not on file  Occupational History  . Not on file  Tobacco Use  . Smoking status: Never Smoker  . Smokeless tobacco: Never Used  Vaping Use  . Vaping Use: Never used  Substance and Sexual Activity  . Alcohol use: Yes    Comment: occassinally   . Drug use: Yes    Types: Marijuana  . Sexual activity: Yes  Other Topics Concern  . Not on file  Social History Narrative  . Not on file   Social Determinants of Health   Financial Resource Strain: Not on file  Food Insecurity: Not on file  Transportation Needs: Not on file  Physical Activity: Not on file  Stress: Not on file  Social Connections: Not on file  Intimate Partner Violence: Not on file    Family History  Problem Relation Age of Onset  . Diabetes Father   . Varicose Veins Father   . Diabetes Paternal Aunt   . Uterine cancer Paternal Aunt        precancerous  . Diabetes Paternal Grandmother   . Uterine cancer Paternal Grandmother        precancerous  . Thyroid disease Mother   . Thyroid disease Maternal Aunt   . Thyroid disease Maternal Grandmother   . Cancer Other        stomach vs ovarian/cervical  . Cancer Paternal Great-grandmother        unk     Current Outpatient  Medications:  .  busPIRone (BUSPAR) 15 MG tablet, Take 15 mg by mouth 2 (two) times daily., Disp: , Rfl:  .  citalopram (CELEXA) 40 MG tablet, Take 40 mg by mouth daily., Disp: , Rfl:  .  clonazePAM (KLONOPIN) 0.5 MG tablet, TAKE 1 TABLET BY MOUTH 3 TIMES DAILY AS NEEDED., Disp: 60 tablet, Rfl: 0 .  dexamethasone (DECADRON) 4 MG tablet, Take 2 tablets (8 mg total) by mouth daily. Start the day after chemotherapy for 2 days., Disp: 30 tablet, Rfl: 1 .  ibuprofen (ADVIL) 200 MG tablet, , Disp: , Rfl:  .  lidocaine-prilocaine (EMLA) cream, Apply to affected area once, Disp: 30 g, Rfl: 3 .  LORazepam (ATIVAN) 0.5 MG tablet, Take 1 tablet (0.5 mg total) by mouth every 6 (six) hours as needed (Nausea or vomiting)., Disp: 30 tablet, Rfl: 0 .  acetaminophen (TYLENOL) 325 MG tablet, , Disp: , Rfl:  .  albuterol (VENTOLIN HFA) 108 (90 Base) MCG/ACT inhaler, Inhale 1-2 puffs into the lungs every 6 (six) hours as needed for  shortness of breath or wheezing. (Patient not taking: No sig reported), Disp: , Rfl:  .  Ascorbic Acid (SUPER C COMPLEX PO), Take 1 capsule by mouth daily. (Patient not taking: No sig reported), Disp: , Rfl:  .  norethindrone-ethinyl estradiol (LOESTRIN FE) 1-20 MG-MCG tablet, Take 1 tablet by mouth daily. (Patient not taking: Reported on 01/07/2021), Disp: , Rfl:  .  ondansetron (ZOFRAN) 8 MG tablet, Take 1 tablet (8 mg total) by mouth 2 (two) times daily as needed for refractory nausea / vomiting. Start on day 3 after chemo. (Patient not taking: No sig reported), Disp: 30 tablet, Rfl: 1 .  prochlorperazine (COMPAZINE) 10 MG tablet, Take 1 tablet (10 mg total) by mouth every 6 (six) hours as needed (Nausea or vomiting). (Patient not taking: No sig reported), Disp: 30 tablet, Rfl: 1  Physical exam:  Vitals:   01/07/21 0940  BP: (!) 103/56  Pulse: 79  Resp: 20  Temp: 99 F (37.2 C)  TempSrc: Tympanic  SpO2: 99%  Weight: 197 lb 9.6 oz (89.6 kg)   Physical Exam Constitutional:       General: She is not in acute distress. Cardiovascular:     Rate and Rhythm: Normal rate and regular rhythm.     Heart sounds: Normal heart sounds.  Pulmonary:     Effort: Pulmonary effort is normal.     Breath sounds: Normal breath sounds.  Abdominal:     General: Bowel sounds are normal.     Palpations: Abdomen is soft.  Skin:    General: Skin is warm and dry.  Neurological:     Mental Status: She is alert and oriented to person, place, and time.      CMP Latest Ref Rng & Units 01/07/2021  Glucose 70 - 99 mg/dL 88  BUN 6 - 20 mg/dL 20  Creatinine 4.76 - 8.91 mg/dL 5.52  Sodium 536 - 483 mmol/L 136  Potassium 3.5 - 5.1 mmol/L 4.1  Chloride 98 - 111 mmol/L 104  CO2 22 - 32 mmol/L 24  Calcium 8.9 - 10.3 mg/dL 8.9(R)  Total Protein 6.5 - 8.1 g/dL 6.2(L)  Total Bilirubin 0.3 - 1.2 mg/dL 0.4  Alkaline Phos 38 - 126 U/L 55  AST 15 - 41 U/L 27  ALT 0 - 44 U/L 46(H)   CBC Latest Ref Rng & Units 01/07/2021  WBC 4.0 - 10.5 K/uL 6.2  Hemoglobin 12.0 - 15.0 g/dL 11.6(L)  Hematocrit 36.0 - 46.0 % 33.6(L)  Platelets 150 - 400 K/uL 213    No images are attached to the encounter.  DG Chest 1 View  Result Date: 12/13/2020 CLINICAL DATA:  Port-A-Cath insertion EXAM: CHEST  1 VIEW COMPARISON:  None. FINDINGS: Port-A-Cath tip is in the superior vena cava. No pneumothorax. Lungs are clear. Heart size and pulmonary vascularity are normal. No adenopathy. No bone lesions. IMPRESSION: Port-A-Cath tip in superior vena cava. No pneumothorax. Lungs clear. Cardiac silhouette normal. Electronically Signed   By: Bretta Bang III M.D.   On: 12/13/2020 13:59   NM Bone Scan Whole Body  Result Date: 12/12/2020 CLINICAL DATA:  Breast cancer, initial staging examination EXAM: NUCLEAR MEDICINE WHOLE BODY BONE SCAN TECHNIQUE: Whole body anterior and posterior images were obtained approximately 3 hours after intravenous injection of radiopharmaceutical. RADIOPHARMACEUTICALS:  22.737 mCi Technetium-38m MDP IV  COMPARISON:  None. FINDINGS: Normal distribution of radiotracer within the visualized axial and appendicular skeleton. No evidence of osseous metastatic disease. Mild soft tissue uptake within the breast tissue bilaterally.  Otherwise normal soft tissue distribution. Normal uptake and excretion within the kidneys and bladder. IMPRESSION: No evidence of osseous metastatic disease. Electronically Signed   By: Fidela Salisbury MD   On: 12/12/2020 23:21   CT CHEST ABDOMEN PELVIS W CONTRAST  Result Date: 12/17/2020 CLINICAL DATA:  Initial staging of left breast cancer EXAM: CT CHEST, ABDOMEN, AND PELVIS WITH CONTRAST TECHNIQUE: Multidetector CT imaging of the chest, abdomen and pelvis was performed following the standard protocol during bolus administration of intravenous contrast. CONTRAST:  132mL OMNIPAQUE IOHEXOL 300 MG/ML  SOLN COMPARISON:  MRI breast 12/16/2020 FINDINGS: CT CHEST FINDINGS Cardiovascular: Right Port-A-Cath tip: SVC. Mediastinum/Nodes: Left axillary node with parenchymal thickness of 0.6 cm noted (1.1 cm if the fatty hilum is included), image 11 series 2. On image 14 of series 2, a lymph node with parenchymal thickness of 0.6 cm is observed (1.0 cm if the fatty hilum is included). Right axillary node with parenchymal thickness is 0.4 cm (0.8 cm if the fatty hilum is included), image 15 series 2. Right paratracheal lymph node 0.6 cm short axis on image 22 series 2, within normal size limits. Small distal paraesophageal and periaortic lymph nodes are not pathologically enlarged this time. Left lower paratracheal lymph node 0.7 cm in short axis. Lungs/Pleura: Mild biapical pleuroparenchymal scarring. 2 mm calcified right lower lobe pulmonary nodule on image 73 series 4, likely a granuloma. Similar 0.3 cm superior segment right lower lobe nodule on image 62 series 4, calcified and likely a chronic benign granuloma. Small granulomas are present in the superior segment left lower lobe (image 56 series 4)  and in the left upper lobe (image 65, series 4). Non-calcified nodules: 0.5 by 0.2 cm left upper lobe nodule on image 40 series 4. 0.3 cm left lower lobe basilar nodule on image 132 series 4. Musculoskeletal: 0.6 by 0.4 cm lucent lesion medially in the left first rib shown on image 111 series 6 and image 13 series 4. This might be a hemangioma or similar benign lesion, but the lesion is essentially nonspecific primarily due to small size. Small locules of gas along the right lateral breast likely from recent biopsy. A overlying compresses in place. There is a clip along a left upper breast mass. CT ABDOMEN PELVIS FINDINGS Hepatobiliary: Unremarkable Pancreas: Unremarkable Spleen: Unremarkable Adrenals/Urinary Tract: Unremarkable Stomach/Bowel: Prominent stool throughout the colon favors constipation. Vascular/Lymphatic: Right inguinal node 1.1 cm in short axis on image 134 series 2. Separate right inguinal node 1.2 cm in short axis, image 137 series 2. These are not overtly pathologically enlarged for this location but are somewhat asymmetric compared to the contralateral side. Right external iliac node 0.7 cm in short axis, image 122 series 2. Small mesenteric lymph nodes are present. Reproductive: Unremarkable Other: No supplemental non-categorized findings. Musculoskeletal: Suspected mild disc bulge L5-S1. IMPRESSION: 1. Mildly enlarged right inguinal lymph nodes, nonspecific. Left upper breast mass, with minimal asymmetric prominence of left axillary lymph nodes compared to the right, although not overtly pathologically enlarged by size criteria. 2. Upper normal sized and slightly asymmetric right inguinal lymph nodes, surveillance suggested. 3. Small locules of gas along the right lateral breast likely from recent biopsy. 4. Several tiny pulmonary nodules are present. Most of these are calcified compatible with benign old granulomatous disease. A couple are noncalcified and merit surveillance. 5. 0.6 by 0.4 cm  lucent lesion medially in the left first rib, probably a hemangioma or similar benign lesion, but the lesion is essentially nonspecific primarily due to small  size and merits surveillance. 6. Prominent stool throughout the colon favors constipation. 7. Suspected mild disc bulge L5-S1. Electronically Signed   By: Van Clines M.D.   On: 12/17/2020 09:01   DG C-Arm 1-60 Min-No Report  Result Date: 12/13/2020 Fluoroscopy was utilized by the requesting physician.  No radiographic interpretation.   MM CLIP PLACEMENT RIGHT  Result Date: 12/16/2020 CLINICAL DATA:  Evaluate BARBELL clip placement following MR guided RIGHT breast biopsy. EXAM: DIAGNOSTIC RIGHT MAMMOGRAM POST MRI BIOPSY COMPARISON:  Previous exam(s). FINDINGS: The patient presented [facility] her clip views. Mammographic images were obtained following MR guided biopsy of posterior UPPER-OUTER RIGHT breast non masslike enhancement. The BARBELL biopsy marking clip is in expected position at the site of biopsy. IMPRESSION: Appropriate positioning of the BARBELL shaped biopsy marking clip at the site of biopsy in the UPPER OUTER RIGHT breast. Final Assessment: Post Procedure Mammograms for Marker Placement Electronically Signed   By: Margarette Canada M.D.   On: 12/16/2020 13:36   MR RT BREAST BX W LOC DEV 1ST LESION IMAGE BX SPEC MR GUIDE  Addendum Date: 12/23/2020   ADDENDUM REPORT: 12/18/2020 13:23 ADDENDUM: Pathology revealed BENIGN BREAST TISSUE of the RIGHT breast, outer. This was found to be concordant by Dr. Hassan Rowan. Pathology results were discussed with the patient by telephone. The patient reported doing well after the biopsy with tenderness and bleeding at the site. Post biopsy instructions and care were reviewed and questions were answered. The patient was encouraged to call [facility] for any additional concerns. My direct phone number was provided. The patient has a recent  diagnosis of LEFT breast cancer and should follow her outlined treatment plan. If breast conservation is being considered, recommendation for MR guided core needle biopsy of the 2.6 cm area of linear, clumped non mass enhancement in the posterior aspect of the lower outer quadrant of the LEFT breast, and MR guided core needle biopsy of the anterior extent of the 2.3 cm linear component extending anteriorly from the biopsy proven invasive mammary carcinoma in the 12 o'clock position of the LEFT breast. Dr. Randa Evens and Al Pimple, RN Oncology Navigator with [employer] were notified of biopsy results and recommendations via EPIC message on December 18, 2020. Pathology results reported by Terie Purser, RN on 12/18/2020. Electronically Signed   By: Margarette Canada M.D.   On: 12/18/2020 13:23   Result Date: 12/23/2020 CLINICAL DATA:  30 year old female for tissue sampling of 4.1 cm area of UPPER-OUTER RIGHT breast non masslike enhancement. Recent diagnosis of LEFT breast cancer. EXAM: MRI GUIDED CORE NEEDLE BIOPSY OF THE RIGHT BREAST TECHNIQUE: Multiplanar, multisequence MR imaging of the RIGHT breast was performed both before and after administration of intravenous contrast. CONTRAST:  76mL GADAVIST GADOBUTROL 1 MMOL/ML IV SOLN COMPARISON:  Previous exams. FINDINGS: I met with the patient, and we discussed the procedure of MRI guided biopsy, including risks, benefits, and alternatives. Specifically, we discussed the risks of infection, bleeding, tissue injury, clip migration, and inadequate sampling. Informed, written consent was given. The usual time out protocol was performed immediately prior to the procedure. Using sterile technique, 1% Lidocaine, MRI guidance, and a 9 gauge vacuum assisted device, biopsy was performed of the posterior UPPER-OUTER RIGHT breast non masslike enhancement using a LATERAL approach. At the conclusion of the procedure, a BARBELL tissue marker clip was deployed into the  biopsy cavity. Follow-up 2-view mammogram was performed and dictated separately. IMPRESSION: MRI  guided biopsy of RIGHT breast biopsy. No apparent complications. The patient states she is unsure if she will undergo LEFT mastectomy, and there are 2 other LEFT breast areas on recent breast MR that were recommended to be sampled if breast conservation is desired. Only the RIGHT breast was sampled today as we only had an order for the MR guided RIGHT breast biopsy. If LEFT breast conservation will be considered, then MR guided biopsies of the 2 other areas in the LEFT breast is recommended (as on prior report). Electronically Signed: By: Margarette Canada M.D. On: 12/16/2020 10:28     Assessment and plan- Patient is a 30 y.o. female with stage IIIb triple negative breast cancer of the left breast cT2 N1 M0.  She is here for on treatment assessment prior to cycle 4 of weekly carbotaxol chemotherapy along with Keytruda  Counts okay to proceed with cycle 4 of weekly carbotaxol chemotherapy and Keytruda today.  She will directly proceed for cycle 5 of carbotaxol next week and I will see her back in 2 weeks for cycle 6.  Plan is to complete 12 weekly cycles of carbotaxol chemotherapy with Keytruda followed by [facility].  So far patient is tolerating treatment well without any significant side effects.  She denies any tingling numbness in her hands and feet presently   Visit Diagnosis 1. Encounter for antineoplastic chemotherapy   2. Encounter for antineoplastic immunotherapy   3. Invasive carcinoma of breast ([place])      Dr. Randa Evens, MD, MPH [facility] at [facility] [phone] 01/08/2021 10:34 AM

## 2021-01-09 NOTE — Progress Notes (Signed)
Patient reports husband diagnosed with COVID. Patient negative on Home test.   Requests recommendation for herself related to upcoming treatment.  Dr Janese Banks notified,  Patient to isolate from husband.  Re-test Monday, preferably PCR test.  Notified patient.

## 2021-01-14 ENCOUNTER — Inpatient Hospital Stay: Payer: 59

## 2021-01-14 VITALS — BP 113/66 | HR 97 | Temp 96.8°F | Resp 20 | Wt 197.5 lb

## 2021-01-14 DIAGNOSIS — Z171 Estrogen receptor negative status [ER-]: Secondary | ICD-10-CM

## 2021-01-14 DIAGNOSIS — C50919 Malignant neoplasm of unspecified site of unspecified female breast: Secondary | ICD-10-CM

## 2021-01-14 DIAGNOSIS — C50412 Malignant neoplasm of upper-outer quadrant of left female breast: Secondary | ICD-10-CM

## 2021-01-14 DIAGNOSIS — Z5111 Encounter for antineoplastic chemotherapy: Secondary | ICD-10-CM | POA: Diagnosis not present

## 2021-01-14 LAB — CBC WITH DIFFERENTIAL/PLATELET
Abs Immature Granulocytes: 0.02 10*3/uL (ref 0.00–0.07)
Basophils Absolute: 0 10*3/uL (ref 0.0–0.1)
Basophils Relative: 1 %
Eosinophils Absolute: 0.1 10*3/uL (ref 0.0–0.5)
Eosinophils Relative: 3 %
HCT: 34 % — ABNORMAL LOW (ref 36.0–46.0)
Hemoglobin: 11.8 g/dL — ABNORMAL LOW (ref 12.0–15.0)
Immature Granulocytes: 0 %
Lymphocytes Relative: 33 %
Lymphs Abs: 1.7 10*3/uL (ref 0.7–4.0)
MCH: 31.1 pg (ref 26.0–34.0)
MCHC: 34.7 g/dL (ref 30.0–36.0)
MCV: 89.7 fL (ref 80.0–100.0)
Monocytes Absolute: 0.3 10*3/uL (ref 0.1–1.0)
Monocytes Relative: 6 %
Neutro Abs: 2.9 10*3/uL (ref 1.7–7.7)
Neutrophils Relative %: 57 %
Platelets: 185 10*3/uL (ref 150–400)
RBC: 3.79 MIL/uL — ABNORMAL LOW (ref 3.87–5.11)
RDW: 13.5 % (ref 11.5–15.5)
WBC: 5.2 10*3/uL (ref 4.0–10.5)
nRBC: 0 % (ref 0.0–0.2)

## 2021-01-14 LAB — COMPREHENSIVE METABOLIC PANEL
ALT: 55 U/L — ABNORMAL HIGH (ref 0–44)
AST: 35 U/L (ref 15–41)
Albumin: 4.2 g/dL (ref 3.5–5.0)
Alkaline Phosphatase: 57 U/L (ref 38–126)
Anion gap: 8 (ref 5–15)
BUN: 14 mg/dL (ref 6–20)
CO2: 26 mmol/L (ref 22–32)
Calcium: 9 mg/dL (ref 8.9–10.3)
Chloride: 102 mmol/L (ref 98–111)
Creatinine, Ser: 0.7 mg/dL (ref 0.44–1.00)
GFR, Estimated: >60 mL/min (ref >60)
Glucose, Bld: 96 mg/dL (ref 70–99)
Potassium: 3.7 mmol/L (ref 3.5–5.1)
Sodium: 136 mmol/L (ref 135–145)
Total Bilirubin: 0.6 mg/dL (ref 0.3–1.2)
Total Protein: 6.9 g/dL (ref 6.5–8.1)

## 2021-01-14 MED ORDER — SODIUM CHLORIDE 0.9 % IV SOLN
80.0000 mg/m2 | Freq: Once | INTRAVENOUS | Status: AC
  Administered 2021-01-14: 162 mg via INTRAVENOUS
  Filled 2021-01-14: qty 27

## 2021-01-14 MED ORDER — PALONOSETRON HCL INJECTION 0.25 MG/5ML
0.2500 mg | Freq: Once | INTRAVENOUS | Status: AC
  Administered 2021-01-14: 0.25 mg via INTRAVENOUS
  Filled 2021-01-14: qty 5

## 2021-01-14 MED ORDER — SODIUM CHLORIDE 0.9 % IV SOLN
225.0000 mg | Freq: Once | INTRAVENOUS | Status: AC
  Administered 2021-01-14: 230 mg via INTRAVENOUS
  Filled 2021-01-14: qty 23

## 2021-01-14 MED ORDER — LEUPROLIDE ACETATE 3.75 MG IM KIT
3.7500 mg | PACK | Freq: Once | INTRAMUSCULAR | Status: AC
  Administered 2021-01-14: 3.75 mg via INTRAMUSCULAR
  Filled 2021-01-14: qty 3.75

## 2021-01-14 MED ORDER — SODIUM CHLORIDE 0.9 % IV SOLN
Freq: Once | INTRAVENOUS | Status: AC
  Filled 2021-01-14: qty 250

## 2021-01-14 MED ORDER — HEPARIN SOD (PORK) LOCK FLUSH 100 UNIT/ML IV SOLN
500.0000 [IU] | Freq: Once | INTRAVENOUS | Status: AC
  Administered 2021-01-14: 500 [IU] via INTRAVENOUS
  Filled 2021-01-14: qty 5

## 2021-01-14 MED ORDER — FAMOTIDINE 20 MG IN NS 100 ML IVPB
20.0000 mg | Freq: Once | INTRAVENOUS | Status: AC
  Administered 2021-01-14: 20 mg via INTRAVENOUS
  Filled 2021-01-14: qty 20

## 2021-01-14 MED ORDER — DIPHENHYDRAMINE HCL 50 MG/ML IJ SOLN
50.0000 mg | Freq: Once | INTRAMUSCULAR | Status: AC
  Administered 2021-01-14: 50 mg via INTRAVENOUS
  Filled 2021-01-14: qty 1

## 2021-01-14 MED ORDER — SODIUM CHLORIDE 0.9% FLUSH
10.0000 mL | Freq: Once | INTRAVENOUS | Status: AC
  Administered 2021-01-14: 10 mL via INTRAVENOUS
  Filled 2021-01-14: qty 10

## 2021-01-14 MED ORDER — HEPARIN SOD (PORK) LOCK FLUSH 100 UNIT/ML IV SOLN
INTRAVENOUS | Status: AC
  Filled 2021-01-14: qty 5

## 2021-01-14 MED ORDER — SODIUM CHLORIDE 0.9 % IV SOLN
10.0000 mg | Freq: Once | INTRAVENOUS | Status: AC
  Administered 2021-01-14: 10 mg via INTRAVENOUS
  Filled 2021-01-14: qty 10

## 2021-01-15 ENCOUNTER — Telehealth: Payer: Self-pay | Admitting: Licensed Clinical Social Worker

## 2021-01-15 ENCOUNTER — Encounter: Payer: Self-pay | Admitting: Licensed Clinical Social Worker

## 2021-01-15 ENCOUNTER — Ambulatory Visit: Payer: Self-pay | Admitting: Licensed Clinical Social Worker

## 2021-01-15 DIAGNOSIS — Z1379 Encounter for other screening for genetic and chromosomal anomalies: Secondary | ICD-10-CM

## 2021-01-15 DIAGNOSIS — Z809 Family history of malignant neoplasm, unspecified: Secondary | ICD-10-CM

## 2021-01-15 DIAGNOSIS — C50919 Malignant neoplasm of unspecified site of unspecified female breast: Secondary | ICD-10-CM

## 2021-01-15 NOTE — Progress Notes (Signed)
HPI:  Vickie Mathis was previously seen in the [facility] due to a personal and family history of cancer and concerns regarding a hereditary predisposition to cancer. Please refer to our prior cancer genetics clinic note for more information regarding our discussion, assessment and recommendations, at the time. Vickie Mathis recent genetic test results were disclosed to her, as were recommendations warranted by these results. These results and recommendations are discussed in more detail below.  CANCER HISTORY:  Oncology History  Invasive carcinoma of breast ([place])  12/03/2020 Initial Diagnosis   Invasive carcinoma of breast ([place])   12/17/2020 -  Chemotherapy    Patient is on Treatment Plan: BREAST PEMBROLIZUMAB + CARBOPLATIN D1,8,15+ PACLITAXEL D1,8,15 Q21D X 4 CYCLES / PEMBROLIZUMAB + AC Q21D X 4 CYCLES      12/17/2020 Cancer Staging   Staging form: Breast, AJCC 8th Edition - Clinical stage from 12/17/2020: Stage IIIB (cT2, cN1, cM0, G3, ER-, PR-, HER2-) - Signed by Sindy Guadeloupe, MD on 12/19/2020 Histologic grading system: 3 grade system    Genetic Testing   Negative genetic testing. No pathogenic variants identified on the [facility] CancerNext-Expanded+RNA Panel. 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 The report date is 01/14/2021.  The CancerNext-Expanded + RNAinsight gene panel offered by [employer] and includes sequencing and rearrangement analysis for the following 77 genes: IP, ALK, APC*, ATM*, AXIN2, BAP1, BARD1, BLM, BMPR1A, BRCA1*, BRCA2*, BRIP1*, CDC73, CDH1*,CDK4, CDKN1B, CDKN2A, CHEK2*, CTNNA1, DICER1, FANCC, FH, FLCN, GALNT12, KIF1B, LZTR1, MAX, MEN1, MET, MLH1*, MSH2*, MSH3, MSH6*, MUTYH*, NBN, NF1*, NF2, NTHL1, PALB2*, PHOX2B, PMS2*, POT1, PRKAR1A, PTCH1, PTEN*, RAD51C*, RAD51D*,RB1, RECQL, RET, SDHA, SDHAF2, SDHB, SDHC, SDHD, SMAD4, SMARCA4, SMARCB1, SMARCE1, STK11, SUFU, TMEM127, TP53*,TSC1, TSC2, VHL and XRCC2 (sequencing and deletion/duplication); EGFR,  EGLN1, HOXB13, KIT, MITF, PDGFRA, POLD1 and POLE (sequencing only); EPCAM and GREM1 (deletion/duplication only).     FAMILY HISTORY:  We obtained a detailed, 4-generation family history.  Significant diagnoses are listed below: Family History  Problem Relation Age of Onset  . Diabetes Father   . Varicose Veins Father   . Diabetes Paternal Aunt   . Uterine cancer Paternal Aunt        precancerous  . Diabetes Paternal Grandmother   . Uterine cancer Paternal Grandmother        precancerous  . Thyroid disease Mother   . Thyroid disease Maternal Aunt   . Thyroid disease Maternal Grandmother   . Cancer Other        stomach vs ovarian/cervical  . Cancer Paternal Great-grandmother        unk   Vickie Mathis has a brother and a sister, no cancers.   Vickie Mathis mother is living at 73. Patient had 2 maternal uncles, 2 maternal aunts, no cancers. No known cancers on this side of the family.  Vickie Mathis father is 23, no cancers. Patient has 1 paternal aunt, 2 paternal uncles. Her aunt had a hysterectomy for precancerous cells in her uterus. Paternal grandfather had esophageal cancer and history of smoking. Grandmother also had hysterectomy for precancerous cells in her uterus. Grandmother's sister had cancer, either stomach or ovarian, and their mother (patient's great grandmother) also had cancer.   Vickie Mathis is unaware of previous family history of genetic testing for hereditary cancer risks. Patient's maternal ancestors are of English/Irish/European descent, and paternal ancestors are of Scandinavian/Norwegian/European  descent. There is no reported Ashkenazi Jewish ancestry. There is no known consanguinity.     GENETIC TEST RESULTS: Genetic testing reported  out on 01/14/2021 through the [facility] CancerNext-Expanded+RNA cancer panel found no pathogenic mutations.   The CancerNext-Expanded + RNAinsight gene panel offered by [employer] and includes sequencing and rearrangement  analysis for the following 77 genes: IP, ALK, APC*, ATM*, AXIN2, BAP1, BARD1, BLM, BMPR1A, BRCA1*, BRCA2*, BRIP1*, CDC73, CDH1*,CDK4, CDKN1B, CDKN2A, CHEK2*, CTNNA1, DICER1, FANCC, FH, FLCN, GALNT12, KIF1B, LZTR1, MAX, MEN1, MET, MLH1*, MSH2*, MSH3, MSH6*, MUTYH*, NBN, NF1*, NF2, NTHL1, PALB2*, PHOX2B, PMS2*, POT1, PRKAR1A, PTCH1, PTEN*, RAD51C*, RAD51D*,RB1, RECQL, RET, SDHA, SDHAF2, SDHB, SDHC, SDHD, SMAD4, SMARCA4, SMARCB1, SMARCE1, STK11, SUFU, TMEM127, TP53*,TSC1, TSC2, VHL and XRCC2 (sequencing and deletion/duplication); EGFR, EGLN1, HOXB13, KIT, MITF, PDGFRA, POLD1 and POLE (sequencing only); EPCAM and GREM1 (deletion/duplication only).   The test report has been scanned into EPIC and is located under the Molecular Pathology section of the Results Review tab.  A portion of the result report is included below for reference.     We discussed with Vickie Mathis that because current genetic testing is not perfect, it is possible there may be a gene mutation in one of these genes that current testing cannot detect, but that chance is small.  We also discussed that there could be another gene that has not yet been discovered, or that we have not yet tested, that is responsible for the cancer diagnoses in the family. It is also possible there is a hereditary cause for the cancer in the family that Vickie Mathis did not inherit and therefore was not identified in her testing.  Therefore, it is important to remain in touch with cancer genetics in the future so that we can continue to offer Vickie Mathis the most up to date genetic testing.   Genetic testing did identify a variant of uncertain significance (VUS) in the PTCH1 gene called c.3929G>A.  At this time, it is unknown if this variant is associated with increased cancer risk or if this is a normal finding, but most variants such as this get reclassified to being inconsequential. It should not be used to make medical management decisions. With time, we  suspect the lab will determine the significance of this variant, if any. If we do learn more about it, we will try to contact Vickie Mathis to discuss it further. However, it is important to stay in touch with [place] periodically and keep the address and phone number up to date.  ADDITIONAL GENETIC TESTING: We discussed with Vickie Mathis that her genetic testing was fairly extensive.  If there are genes identified to increase cancer risk that can be analyzed in the future, we would be happy to discuss and coordinate this testing at that time.    CANCER SCREENING RECOMMENDATIONS: Vickie Mathis test result is considered negative (normal).  This means that we have not identified a hereditary cause for her  personal and family history of cancer at this time. Most cancers happen by chance and this negative test suggests that her cancer may fall into this category.    While reassuring, this does not definitively rule out a hereditary predisposition to cancer. It is still possible that there could be genetic mutations that are undetectable by current technology. There could be genetic mutations in genes that have not been tested or identified to increase cancer risk. Given Vickie Mathis's personal history, this result may simply reflect our current inability to detect all mutations within these genes or there may be a different gene that has not yet been discovered or tested.  It is recommended she continue to  follow the cancer management and screening guidelines provided by her oncology and primary healthcare provider.   An individual's cancer risk and medical management are not determined by genetic test results alone. Overall cancer risk assessment incorporates additional factors, including personal medical history, family history, and any available genetic information that may result in a personalized plan for cancer prevention and surveillance.  RECOMMENDATIONS FOR FAMILY MEMBERS:  Relatives in this family might  be at some increased risk of developing cancer, over the general population risk, simply due to the family history of cancer.  We recommended female relatives in this family have a yearly mammogram beginning at age 14, or 11 years younger than the earliest onset of cancer, an annual clinical breast exam, and perform monthly breast self-exams. Female relatives in this family should also have a gynecological exam as recommended by their primary provider.  All family members should be referred for colonoscopy starting at age 60.   FOLLOW-UP: Lastly, we discussed with Vickie Mathis that cancer genetics is a rapidly advancing field and it is possible that new genetic tests will be appropriate for her and/or her family members in the future. We encouraged her to remain in contact with cancer genetics on an annual basis so we can update her personal and family histories and let her know of advances in cancer genetics that may benefit this family.   Our contact number was provided. Vickie Mathis questions were answered to her satisfaction, and she knows she is welcome to call us at anytime with additional questions or concerns.   Faith Rogue, MS, [facility] Genetic Counselor [email or web address] Phone: [phone]

## 2021-01-15 NOTE — Telephone Encounter (Signed)
Revealed negative genetic testing.  Revealed that a VUS in PTCH1 was identified.  We discussed that we do not know why she has breast cancer or why there is cancer in the family. It could be due to a different gene that we are not testing, or something our current technology cannot pick up.  It will be important for her to keep in contact with genetics to learn if additional testing may be needed in the future.

## 2021-01-21 ENCOUNTER — Encounter: Payer: Self-pay | Admitting: Oncology

## 2021-01-21 ENCOUNTER — Inpatient Hospital Stay: Payer: 59

## 2021-01-21 ENCOUNTER — Inpatient Hospital Stay (HOSPITAL_BASED_OUTPATIENT_CLINIC_OR_DEPARTMENT_OTHER): Payer: 59 | Admitting: Oncology

## 2021-01-21 ENCOUNTER — Other Ambulatory Visit: Payer: Self-pay | Admitting: *Deleted

## 2021-01-21 VITALS — BP 118/78 | HR 79 | Temp 98.4°F | Wt 195.3 lb

## 2021-01-21 DIAGNOSIS — Z5111 Encounter for antineoplastic chemotherapy: Secondary | ICD-10-CM | POA: Diagnosis not present

## 2021-01-21 DIAGNOSIS — C50919 Malignant neoplasm of unspecified site of unspecified female breast: Secondary | ICD-10-CM

## 2021-01-21 DIAGNOSIS — R232 Flushing: Secondary | ICD-10-CM

## 2021-01-21 DIAGNOSIS — C50412 Malignant neoplasm of upper-outer quadrant of left female breast: Secondary | ICD-10-CM

## 2021-01-21 DIAGNOSIS — Z171 Estrogen receptor negative status [ER-]: Secondary | ICD-10-CM

## 2021-01-21 LAB — CBC WITH DIFFERENTIAL/PLATELET
Abs Immature Granulocytes: 0.04 10*3/uL (ref 0.00–0.07)
Basophils Absolute: 0.1 10*3/uL (ref 0.0–0.1)
Basophils Relative: 1 %
Eosinophils Absolute: 0.1 10*3/uL (ref 0.0–0.5)
Eosinophils Relative: 2 %
HCT: 34.5 % — ABNORMAL LOW (ref 36.0–46.0)
Hemoglobin: 11.8 g/dL — ABNORMAL LOW (ref 12.0–15.0)
Immature Granulocytes: 1 %
Lymphocytes Relative: 42 %
Lymphs Abs: 1.9 10*3/uL (ref 0.7–4.0)
MCH: 31 pg (ref 26.0–34.0)
MCHC: 34.2 g/dL (ref 30.0–36.0)
MCV: 90.6 fL (ref 80.0–100.0)
Monocytes Absolute: 0.4 10*3/uL (ref 0.1–1.0)
Monocytes Relative: 9 %
Neutro Abs: 2.1 10*3/uL (ref 1.7–7.7)
Neutrophils Relative %: 45 %
Platelets: 189 10*3/uL (ref 150–400)
RBC: 3.81 MIL/uL — ABNORMAL LOW (ref 3.87–5.11)
RDW: 14.1 % (ref 11.5–15.5)
WBC: 4.6 10*3/uL (ref 4.0–10.5)
nRBC: 0 % (ref 0.0–0.2)

## 2021-01-21 LAB — COMPREHENSIVE METABOLIC PANEL
ALT: 47 U/L — ABNORMAL HIGH (ref 0–44)
AST: 28 U/L (ref 15–41)
Albumin: 4.1 g/dL (ref 3.5–5.0)
Alkaline Phosphatase: 66 U/L (ref 38–126)
Anion gap: 8 (ref 5–15)
BUN: 14 mg/dL (ref 6–20)
CO2: 24 mmol/L (ref 22–32)
Calcium: 9.1 mg/dL (ref 8.9–10.3)
Chloride: 105 mmol/L (ref 98–111)
Creatinine, Ser: 0.6 mg/dL (ref 0.44–1.00)
GFR, Estimated: >60 mL/min (ref >60)
Glucose, Bld: 89 mg/dL (ref 70–99)
Potassium: 4 mmol/L (ref 3.5–5.1)
Sodium: 137 mmol/L (ref 135–145)
Total Bilirubin: 0.6 mg/dL (ref 0.3–1.2)
Total Protein: 7.3 g/dL (ref 6.5–8.1)

## 2021-01-21 MED ORDER — SODIUM CHLORIDE 0.9 % IV SOLN
10.0000 mg | Freq: Once | INTRAVENOUS | Status: AC
  Administered 2021-01-21: 10 mg via INTRAVENOUS
  Filled 2021-01-21: qty 10

## 2021-01-21 MED ORDER — SODIUM CHLORIDE 0.9 % IV SOLN
225.0000 mg | Freq: Once | INTRAVENOUS | Status: AC
  Administered 2021-01-21: 230 mg via INTRAVENOUS
  Filled 2021-01-21: qty 23

## 2021-01-21 MED ORDER — DIPHENHYDRAMINE HCL 50 MG/ML IJ SOLN
50.0000 mg | Freq: Once | INTRAMUSCULAR | Status: AC
  Administered 2021-01-21: 50 mg via INTRAVENOUS
  Filled 2021-01-21: qty 1

## 2021-01-21 MED ORDER — SODIUM CHLORIDE 0.9 % IV SOLN
80.0000 mg/m2 | Freq: Once | INTRAVENOUS | Status: AC
  Administered 2021-01-21: 162 mg via INTRAVENOUS
  Filled 2021-01-21: qty 27

## 2021-01-21 MED ORDER — HEPARIN SOD (PORK) LOCK FLUSH 100 UNIT/ML IV SOLN
INTRAVENOUS | Status: AC
  Filled 2021-01-21: qty 5

## 2021-01-21 MED ORDER — HEPARIN SOD (PORK) LOCK FLUSH 100 UNIT/ML IV SOLN
500.0000 [IU] | Freq: Once | INTRAVENOUS | Status: AC | PRN
  Administered 2021-01-21: 500 [IU]
  Filled 2021-01-21: qty 5

## 2021-01-21 MED ORDER — PALONOSETRON HCL INJECTION 0.25 MG/5ML
0.2500 mg | Freq: Once | INTRAVENOUS | Status: AC
  Administered 2021-01-21: 0.25 mg via INTRAVENOUS
  Filled 2021-01-21: qty 5

## 2021-01-21 MED ORDER — SODIUM CHLORIDE 0.9 % IV SOLN
Freq: Once | INTRAVENOUS | Status: AC
  Filled 2021-01-21: qty 250

## 2021-01-21 MED ORDER — FAMOTIDINE 20 MG IN NS 100 ML IVPB
20.0000 mg | Freq: Once | INTRAVENOUS | Status: AC
  Administered 2021-01-21: 20 mg via INTRAVENOUS
  Filled 2021-01-21: qty 20

## 2021-01-22 NOTE — Progress Notes (Signed)
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 She sought medical attention recently after thinking it was a possible cyst for all this while.She underwent a diagnostic bilateral mammogram and ultrasound which showed a 2.7 cm mass at the 12 o'clock position 6 cm from the nipple. Ultrasound of the left axilla demonstrates 2 lymph nodes with mild thickened cortices of 4 mm. There is skin thickening on the lower inner quadrant of the left breast on mammography.Both the breast mass and the lymph node were biopsied and was positive for invasive mammary carcinoma grade 3. Lymph node was also suspicious for extracapsular extension.ER/PR and HER-2 negative  MRI showed irregular enhancing mass at the 12 o'clock position of the left breast measuring 2.5 x 2.3 cm and a linear component extending 2.3 cm anteriorly. The mass in the anterior linear extension combined measure 4.3 cm. 3.9 cm in the cephalocaudal dimension. Also an area of clumped non-mass enhancement in the posterior aspect of the lower quadrant of the left breast measuring 2.6 x 0.9 cm which looks suspicious. 2 enlarged left axillary lymph nodes and 2 mildly enlarged internal mammary lymph nodes. 4.3 cm clumped area of non-mass enhancement in the outer quadrant of the right breast. 3 right axillary lymph nodes with mild cortical thickening.  Patient underwent  biopsy of the right breast non-mass enhancement andthat was negative for malignancy  CT scan showed mildly enlarged right inguinal lymph nodes nonspecific. Left upper breast mass with prominent left axillary lymph nodes. Subcentimeter pulmonary nodules which are calcified and compatible with benign old granulomatous disease. 0.6 5.4 cm lucent lesion in the left first rib possibly a hemangioma or benign lesion.  Interval history-patient reports having ongoing fatigue as well as intermittent hot flashes.  Reports pain in her right knee which comes and goes.  Denies any significant nausea or vomiting.  Reports mild tingling numbness in her hands and feet.  ECOG PS- 1 Pain scale- 0   Review of systems- Review of Systems  Constitutional: Positive for malaise/fatigue. Negative for chills, fever and weight loss.  HENT: Negative for congestion, ear discharge and nosebleeds.   Eyes: Negative for blurred vision.  Respiratory: Negative for cough, hemoptysis, sputum production, shortness of breath and wheezing.   Cardiovascular: Negative for chest pain, palpitations, orthopnea and claudication.  Gastrointestinal: Negative for abdominal pain, blood in stool, constipation, diarrhea, heartburn, melena, nausea and vomiting.  Genitourinary: Negative for dysuria, flank pain, frequency, hematuria and urgency.  Musculoskeletal: Negative for back pain, joint pain and myalgias.  Skin: Negative for rash.  Neurological: Negative for dizziness, tingling, focal weakness, seizures, weakness and headaches.  Endo/Heme/Allergies: Does not bruise/bleed easily.       Hot flashes  Psychiatric/Behavioral: Negative for depression and suicidal ideas. The patient does not have insomnia.      Allergies  Allergen Reactions  . Amoxicillin     Other reaction(s): "too young to remember what they do to me"  . Sulfa Antibiotics  Other reaction(s): "too young to remember what they do to me"     Past Medical History:   Diagnosis Date  . Anxiety   . Asthma   . Breast cancer ([place])   . Depression   . Family history of cancer   . Varicose veins of bilateral lower extremities with pain      Past Surgical History:  Procedure Laterality Date  . APPENDECTOMY  2018  . BREAST BIOPSY Left 11/26/2020   vision 12:00 6cmfn path pending  . BREAST BIOPSY Left 11/26/2020   LN t3 marler path pending  . PORTACATH PLACEMENT Right 12/13/2020   Procedure: INSERTION PORT-A-CATH;  Surgeon: Herbert Pun, MD;  Location: [facility] ORS;  Service: General;  Laterality: Right;    Social History   Socioeconomic History  . Marital status: Married    Spouse name: Not on file  . Number of children: Not on file  . Years of education: Not on file  . Highest education level: Not on file  Occupational History  . Not on file  Tobacco Use  . Smoking status: Never Smoker  . Smokeless tobacco: Never Used  Vaping Use  . Vaping Use: Never used  Substance and Sexual Activity  . Alcohol use: Yes    Comment: occassinally   . Drug use: Yes    Types: Marijuana  . Sexual activity: Yes  Other Topics Concern  . Not on file  Social History Narrative  . Not on file   Social Determinants of Health   Financial Resource Strain: Not on file  Food Insecurity: Not on file  Transportation Needs: Not on file  Physical Activity: Not on file  Stress: Not on file  Social Connections: Not on file  Intimate Partner Violence: Not on file    Family History  Problem Relation Age of Onset  . Diabetes Father   . Varicose Veins Father   . Diabetes Paternal Aunt   . Uterine cancer Paternal Aunt        precancerous  . Diabetes Paternal Grandmother   . Uterine cancer Paternal Grandmother        precancerous  . Thyroid disease Mother   . Thyroid disease Maternal Aunt   . Thyroid disease Maternal Grandmother   . Cancer Other        stomach vs ovarian/cervical  . Cancer Paternal Great-grandmother        unk     Current  Outpatient Medications:  .  busPIRone (BUSPAR) 15 MG tablet, Take 15 mg by mouth 2 (two) times daily., Disp: , Rfl:  .  citalopram (CELEXA) 40 MG tablet, Take 40 mg by mouth daily., Disp: , Rfl:  .  clonazePAM (KLONOPIN) 0.5 MG tablet, TAKE 1 TABLET BY MOUTH 3 TIMES DAILY AS NEEDED., Disp: 60 tablet, Rfl: 0 .  dexamethasone (DECADRON) 4 MG tablet, Take 2 tablets (8 mg total) by mouth daily. Start the day after chemotherapy for 2 days., Disp: 30 tablet, Rfl: 1 .  ibuprofen (ADVIL) 200 MG tablet, , Disp: , Rfl:  .  lidocaine-prilocaine (EMLA) cream, Apply to affected area once, Disp: 30 g, Rfl: 3 .  LORazepam (ATIVAN) 0.5 MG tablet, Take 1 tablet (0.5 mg total) by mouth every 6 (six) hours as needed (Nausea or vomiting)., Disp: 30 tablet, Rfl: 0 .  acetaminophen (TYLENOL) 325 MG tablet, , Disp: , Rfl:  .  albuterol (VENTOLIN HFA) 108 (90 Base) MCG/ACT inhaler, Inhale 1-2 puffs into the lungs every 6 (six) hours as needed for  shortness of breath or wheezing. (Patient not taking: No sig reported), Disp: , Rfl:  .  Ascorbic Acid (SUPER C COMPLEX PO), Take 1 capsule by mouth daily. (Patient not taking: No sig reported), Disp: , Rfl:  .  norethindrone-ethinyl estradiol (LOESTRIN FE) 1-20 MG-MCG tablet, Take 1 tablet by mouth daily. (Patient not taking: No sig reported), Disp: , Rfl:  .  ondansetron (ZOFRAN) 8 MG tablet, Take 1 tablet (8 mg total) by mouth 2 (two) times daily as needed for refractory nausea / vomiting. Start on day 3 after chemo. (Patient not taking: No sig reported), Disp: 30 tablet, Rfl: 1 .  prochlorperazine (COMPAZINE) 10 MG tablet, Take 1 tablet (10 mg total) by mouth every 6 (six) hours as needed (Nausea or vomiting). (Patient not taking: No sig reported), Disp: 30 tablet, Rfl: 1  Physical exam:  Vitals:   01/21/21 0957  BP: 118/78  Pulse: 79  Temp: 98.4 F (36.9 C)  TempSrc: Tympanic  SpO2: 99%  Weight: 195 lb 4.8 oz (88.6 kg)   Physical Exam Cardiovascular:     Rate and  Rhythm: Normal rate and regular rhythm.     Heart sounds: Normal heart sounds.  Pulmonary:     Effort: Pulmonary effort is normal.     Breath sounds: Normal breath sounds.  Abdominal:     General: Bowel sounds are normal.     Palpations: Abdomen is soft.  Skin:    General: Skin is warm and dry.  Neurological:     Mental Status: She is alert and oriented to person, place, and time.   Breast exam: Left breast mass is vaguely palpable about 1.5 cm in size.  No palpable left axillary adenopathy  CMP Latest Ref Rng & Units 01/21/2021  Glucose 70 - 99 mg/dL 89  BUN 6 - 20 mg/dL 14  Creatinine 0.44 - 1.00 mg/dL 0.60  Sodium 135 - 145 mmol/L 137  Potassium 3.5 - 5.1 mmol/L 4.0  Chloride 98 - 111 mmol/L 105  CO2 22 - 32 mmol/L 24  Calcium 8.9 - 10.3 mg/dL 9.1  Total Protein 6.5 - 8.1 g/dL 7.3  Total Bilirubin 0.3 - 1.2 mg/dL 0.6  Alkaline Phos 38 - 126 U/L 66  AST 15 - 41 U/L 28  ALT 0 - 44 U/L 47(H)   CBC Latest Ref Rng & Units 01/21/2021  WBC 4.0 - 10.5 K/uL 4.6  Hemoglobin 12.0 - 15.0 g/dL 11.8(L)  Hematocrit 36.0 - 46.0 % 34.5(L)  Platelets 150 - 400 K/uL 189     Assessment and plan- Patient is a 30 y.o. female with stage IIIb triple negative breast cancer of the left breast cT2 N1 M0.   She is here for on treatment assessment prior to cycle 6 of weekly CarboTaxol chemotherapy  Counts okay to proceed with cycle 6 of weekly CarboTaxol chemotherapy today.  She will directly proceed for cycle 7 next week when she will also receive Keytruda.  I will see her back in 2 weeks for cycle 8.  Plan is to complete 12 cycles followed by interim ultrasound.  Clinically she has responded well to treatment.  Patient will receive her next dose of Lupron for ovarian suppression during chemotherapy in 3 weeks.  Hot flashes: Likely secondary to ovarian suppression.  We discussed adding gabapentin to her hot flashes which can help with neuropathy and possibly right knee pain as well.  Patient would  like to see how it goes in the next couple of weeks before deciding  further medication management  Chemo induced peripheral neuropathy: Mild.  Continue to monitor.  Will reduce dose of Taxol to 65 mg per metered square.   Visit Diagnosis 1. Encounter for antineoplastic chemotherapy   2. Invasive carcinoma of breast ([place])   3. Hot flashes      Dr. Randa Evens, MD, MPH [facility] at [facility] [phone] 01/22/2021 9:04 AM

## 2021-01-23 ENCOUNTER — Other Ambulatory Visit: Payer: Self-pay | Admitting: Oncology

## 2021-01-23 DIAGNOSIS — Z171 Estrogen receptor negative status [ER-]: Secondary | ICD-10-CM

## 2021-01-23 DIAGNOSIS — C50412 Malignant neoplasm of upper-outer quadrant of left female breast: Secondary | ICD-10-CM

## 2021-01-27 ENCOUNTER — Telehealth: Payer: Self-pay | Admitting: Adult Health

## 2021-01-27 NOTE — Telephone Encounter (Signed)
I called patient to discuss Evusheld, a long acting monoclonal antibody injection administered to patients with decreased immune systems or intolerance/allergy to the COVID 19 vaccine as COVID19 prevention.    Unable to reach patient.  LMOM to return my call.  Mireya Meditz, NP  

## 2021-01-28 ENCOUNTER — Inpatient Hospital Stay: Payer: 59

## 2021-01-28 ENCOUNTER — Other Ambulatory Visit: Payer: Self-pay | Admitting: Oncology

## 2021-01-28 VITALS — BP 105/67 | HR 90 | Temp 97.9°F | Resp 18 | Wt 198.4 lb

## 2021-01-28 DIAGNOSIS — C50412 Malignant neoplasm of upper-outer quadrant of left female breast: Secondary | ICD-10-CM

## 2021-01-28 DIAGNOSIS — Z171 Estrogen receptor negative status [ER-]: Secondary | ICD-10-CM

## 2021-01-28 DIAGNOSIS — Z5111 Encounter for antineoplastic chemotherapy: Secondary | ICD-10-CM | POA: Diagnosis not present

## 2021-01-28 DIAGNOSIS — C50919 Malignant neoplasm of unspecified site of unspecified female breast: Secondary | ICD-10-CM

## 2021-01-28 LAB — CBC WITH DIFFERENTIAL/PLATELET
Abs Immature Granulocytes: 0 10*3/uL (ref 0.00–0.07)
Band Neutrophils: 8 %
Basophils Absolute: 0.1 10*3/uL (ref 0.0–0.1)
Basophils Relative: 2 %
Eosinophils Absolute: 0.1 10*3/uL (ref 0.0–0.5)
Eosinophils Relative: 4 %
HCT: 30.5 % — ABNORMAL LOW (ref 36.0–46.0)
Hemoglobin: 10.7 g/dL — ABNORMAL LOW (ref 12.0–15.0)
Lymphocytes Relative: 45 %
Lymphs Abs: 1.5 10*3/uL (ref 0.7–4.0)
MCH: 31.7 pg (ref 26.0–34.0)
MCHC: 35.1 g/dL (ref 30.0–36.0)
MCV: 90.2 fL (ref 80.0–100.0)
Monocytes Absolute: 0.2 10*3/uL (ref 0.1–1.0)
Monocytes Relative: 5 %
Neutro Abs: 1.5 10*3/uL — ABNORMAL LOW (ref 1.7–7.7)
Neutrophils Relative %: 36 %
Platelets: 217 10*3/uL (ref 150–400)
RBC: 3.38 MIL/uL — ABNORMAL LOW (ref 3.87–5.11)
RDW: 15 % (ref 11.5–15.5)
Smear Review: NORMAL
WBC: 3.3 10*3/uL — ABNORMAL LOW (ref 4.0–10.5)
nRBC: 0 % (ref 0.0–0.2)

## 2021-01-28 LAB — COMPREHENSIVE METABOLIC PANEL
ALT: 31 U/L (ref 0–44)
AST: 24 U/L (ref 15–41)
Albumin: 3.6 g/dL (ref 3.5–5.0)
Alkaline Phosphatase: 59 U/L (ref 38–126)
Anion gap: 11 (ref 5–15)
BUN: 10 mg/dL (ref 6–20)
CO2: 22 mmol/L (ref 22–32)
Calcium: 8.5 mg/dL — ABNORMAL LOW (ref 8.9–10.3)
Chloride: 105 mmol/L (ref 98–111)
Creatinine, Ser: 0.64 mg/dL (ref 0.44–1.00)
GFR, Estimated: >60 mL/min (ref >60)
Glucose, Bld: 104 mg/dL — ABNORMAL HIGH (ref 70–99)
Potassium: 4.1 mmol/L (ref 3.5–5.1)
Sodium: 138 mmol/L (ref 135–145)
Total Bilirubin: 0.5 mg/dL (ref 0.3–1.2)
Total Protein: 6.5 g/dL (ref 6.5–8.1)

## 2021-01-28 MED ORDER — DIPHENHYDRAMINE HCL 50 MG/ML IJ SOLN
50.0000 mg | Freq: Once | INTRAMUSCULAR | Status: AC
  Administered 2021-01-28: 50 mg via INTRAVENOUS
  Filled 2021-01-28: qty 1

## 2021-01-28 MED ORDER — HEPARIN SOD (PORK) LOCK FLUSH 100 UNIT/ML IV SOLN
500.0000 [IU] | Freq: Once | INTRAVENOUS | Status: AC | PRN
  Administered 2021-01-28: 500 [IU]
  Filled 2021-01-28: qty 5

## 2021-01-28 MED ORDER — SODIUM CHLORIDE 0.9 % IV SOLN
10.0000 mg | Freq: Once | INTRAVENOUS | Status: AC
  Administered 2021-01-28: 10 mg via INTRAVENOUS
  Filled 2021-01-28: qty 10

## 2021-01-28 MED ORDER — SODIUM CHLORIDE 0.9 % IV SOLN
200.0000 mg | Freq: Once | INTRAVENOUS | Status: AC
  Administered 2021-01-28: 200 mg via INTRAVENOUS
  Filled 2021-01-28: qty 8

## 2021-01-28 MED ORDER — PALONOSETRON HCL INJECTION 0.25 MG/5ML
0.2500 mg | Freq: Once | INTRAVENOUS | Status: AC
  Administered 2021-01-28: 0.25 mg via INTRAVENOUS
  Filled 2021-01-28: qty 5

## 2021-01-28 MED ORDER — SODIUM CHLORIDE 0.9 % IV SOLN
225.0000 mg | Freq: Once | INTRAVENOUS | Status: AC
  Administered 2021-01-28: 230 mg via INTRAVENOUS
  Filled 2021-01-28: qty 23

## 2021-01-28 MED ORDER — FAMOTIDINE 20 MG IN NS 100 ML IVPB
20.0000 mg | Freq: Once | INTRAVENOUS | Status: AC
  Administered 2021-01-28: 20 mg via INTRAVENOUS
  Filled 2021-01-28: qty 20

## 2021-01-28 MED ORDER — SODIUM CHLORIDE 0.9 % IV SOLN
Freq: Once | INTRAVENOUS | Status: AC
  Filled 2021-01-28: qty 250

## 2021-01-28 MED ORDER — SODIUM CHLORIDE 0.9 % IV SOLN
65.0000 mg/m2 | Freq: Once | INTRAVENOUS | Status: AC
  Administered 2021-01-28: 132 mg via INTRAVENOUS
  Filled 2021-01-28: qty 22

## 2021-01-28 NOTE — Patient Instructions (Signed)
CANCER CENTER [place] [facility]  Discharge Instructions: Thank you for choosing [facility] to provide your oncology and hematology care.  If you have a lab appointment with the Cancer Center, please go directly to the Cancer Center and check in at the registration area.  Wear comfortable clothing and clothing appropriate for easy access to any Portacath or PICC line.   We strive to give you quality time with your provider. You may need to reschedule your appointment if you arrive late (15 or more minutes).  Arriving late affects you and other patients whose appointments are after yours.  Also, if you miss three or more appointments without notifying the office, you may be dismissed from the clinic at the provider's discretion.      For prescription refill requests, have your pharmacy contact our office and allow 72 hours for refills to be completed.    Today you received the following chemotherapy and/or immunotherapy agents Keytruda, Taxol and Carboplatin        To help prevent nausea and vomiting after your treatment, we encourage you to take your nausea medication as directed.  BELOW ARE SYMPTOMS THAT SHOULD BE REPORTED IMMEDIATELY: *FEVER GREATER THAN 100.4 F (38 C) OR HIGHER *CHILLS OR SWEATING *NAUSEA AND VOMITING THAT IS NOT CONTROLLED WITH YOUR NAUSEA MEDICATION *UNUSUAL SHORTNESS OF BREATH *UNUSUAL BRUISING OR BLEEDING *URINARY PROBLEMS (pain or burning when urinating, or frequent urination) *BOWEL PROBLEMS (unusual diarrhea, constipation, pain near the anus) TENDERNESS IN MOUTH AND THROAT WITH OR WITHOUT PRESENCE OF ULCERS (sore throat, sores in mouth, or a toothache) UNUSUAL RASH, SWELLING OR PAIN  UNUSUAL VAGINAL DISCHARGE OR ITCHING   Items with * indicate a potential emergency and should be followed up as soon as possible or go to the Emergency Department if any problems should occur.  Please show the CHEMOTHERAPY ALERT CARD or IMMUNOTHERAPY  ALERT CARD at check-in to the Emergency Department and triage nurse.  Should you have questions after your visit or need to cancel or reschedule your appointment, please contact CANCER CENTER [place] [facility]  [phone] and follow the prompts.  Office hours are 8:00 a.m. to 4:30 p.m. Monday - Friday. Please note that voicemails left after 4:00 p.m. may not be returned until the following business day.  We are closed weekends and major holidays. You have access to a nurse at all times for urgent questions. Please call the main number to the clinic [phone] and follow the prompts.  For any non-urgent questions, you may also contact your provider using MyChart. We now offer e-Visits for anyone 18 and older to request care online for non-urgent symptoms. For details visit [email or web address].   Also download the MyChart app! Go to the app store, search "MyChart", open the app, select [facility], and log in with your MyChart username and password.  Due to Covid, a mask is required upon entering the hospital/clinic. If you do not have a mask, one will be given to you upon arrival. For doctor visits, patients may have 1 support person aged 18 or older with them. For treatment visits, patients cannot have anyone with them due to current Covid guidelines and our immunocompromised population.  

## 2021-01-29 ENCOUNTER — Inpatient Hospital Stay: Payer: 59

## 2021-01-29 ENCOUNTER — Ambulatory Visit: Payer: 59

## 2021-01-29 DIAGNOSIS — Z5111 Encounter for antineoplastic chemotherapy: Secondary | ICD-10-CM | POA: Diagnosis not present

## 2021-01-29 DIAGNOSIS — C50919 Malignant neoplasm of unspecified site of unspecified female breast: Secondary | ICD-10-CM

## 2021-01-29 MED ORDER — FILGRASTIM 480 MCG/0.8ML IJ SOSY
480.0000 ug | PREFILLED_SYRINGE | Freq: Every day | INTRAMUSCULAR | Status: DC
  Administered 2021-01-29: 480 ug via SUBCUTANEOUS
  Filled 2021-01-29: qty 0.8

## 2021-01-30 ENCOUNTER — Ambulatory Visit: Payer: 59

## 2021-01-30 ENCOUNTER — Inpatient Hospital Stay: Payer: 59

## 2021-01-30 DIAGNOSIS — Z5111 Encounter for antineoplastic chemotherapy: Secondary | ICD-10-CM | POA: Diagnosis not present

## 2021-01-30 DIAGNOSIS — C50919 Malignant neoplasm of unspecified site of unspecified female breast: Secondary | ICD-10-CM

## 2021-01-30 MED ORDER — FILGRASTIM 480 MCG/0.8ML IJ SOSY
480.0000 ug | PREFILLED_SYRINGE | Freq: Every day | INTRAMUSCULAR | Status: DC
  Administered 2021-01-30: 480 ug via SUBCUTANEOUS
  Filled 2021-01-30: qty 0.8

## 2021-02-03 ENCOUNTER — Other Ambulatory Visit: Payer: Self-pay | Admitting: Adult Health

## 2021-02-03 ENCOUNTER — Telehealth: Payer: Self-pay | Admitting: Oncology

## 2021-02-03 DIAGNOSIS — C50919 Malignant neoplasm of unspecified site of unspecified female breast: Secondary | ICD-10-CM

## 2021-02-03 NOTE — Progress Notes (Signed)
I connected by phone with Vickie Mathis on 02/03/2021, 10:40 AM to discuss the potential use of a new treatment, tixagevimab/cilgavimab, for pre-exposure prophylaxis for prevention of coronavirus disease 2019 (COVID-19) caused by the SARS-CoV-2 virus.  This patient is a 30 y.o. female that meets the FDA criteria for Emergency Use Authorization of tixagevimab/cilgavimab for pre-exposure prophylaxis of COVID-19 disease. 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  o Patient meets the following definition of mod-severe immune compromised status: 7. Solid tumor malignancies on immunomodulatory chemotherapy or advanced AIDS   I have spoken and communicated the following to the patient or parent/caregiver regarding COVID monoclonal antibody treatment:  1. FDA has authorized the emergency use of tixagevimab/cilgavimab for the pre-exposure prophylaxis of COVID-19 in patients with moderate-severe immunocompromised status, who meet above EUA criteria.  2. The significant known and potential risks and benefits of COVID monoclonal antibody, and the extent to which such potential risks and benefits are unknown.  3. Information on available alternative treatments and the risks and benefits of those alternatives, including clinical trials.  4. The patient or parent/caregiver has the option to accept or refuse COVID  monoclonal antibody treatment.  After reviewing this information with the patient, agree to receive tixagevimab/cilgavimab. High priority schedule message sent to [facility].  Scot Dock, NP, 02/03/2021, 10:40 AM

## 2021-02-03 NOTE — Telephone Encounter (Signed)
Left VM with patient to notify her of EVUSHELD injection scheduled to occur with tomorrow's chemo infusion.

## 2021-02-04 ENCOUNTER — Inpatient Hospital Stay (HOSPITAL_BASED_OUTPATIENT_CLINIC_OR_DEPARTMENT_OTHER): Payer: 59 | Admitting: Oncology

## 2021-02-04 ENCOUNTER — Inpatient Hospital Stay: Payer: 59 | Attending: Oncology

## 2021-02-04 ENCOUNTER — Other Ambulatory Visit: Payer: 59

## 2021-02-04 ENCOUNTER — Inpatient Hospital Stay: Payer: 59

## 2021-02-04 ENCOUNTER — Other Ambulatory Visit: Payer: Self-pay

## 2021-02-04 ENCOUNTER — Telehealth: Payer: Self-pay | Admitting: *Deleted

## 2021-02-04 ENCOUNTER — Ambulatory Visit: Payer: 59

## 2021-02-04 ENCOUNTER — Ambulatory Visit: Payer: 59 | Admitting: Oncology

## 2021-02-04 VITALS — BP 102/58 | HR 90 | Temp 97.4°F | Resp 20 | Wt 193.3 lb

## 2021-02-04 VITALS — BP 126/57 | HR 76 | Resp 16

## 2021-02-04 DIAGNOSIS — Z5111 Encounter for antineoplastic chemotherapy: Secondary | ICD-10-CM

## 2021-02-04 DIAGNOSIS — C50812 Malignant neoplasm of overlapping sites of left female breast: Secondary | ICD-10-CM | POA: Insufficient documentation

## 2021-02-04 DIAGNOSIS — Z5112 Encounter for antineoplastic immunotherapy: Secondary | ICD-10-CM | POA: Insufficient documentation

## 2021-02-04 DIAGNOSIS — C50412 Malignant neoplasm of upper-outer quadrant of left female breast: Secondary | ICD-10-CM

## 2021-02-04 DIAGNOSIS — Z298 Encounter for other specified prophylactic measures: Secondary | ICD-10-CM | POA: Insufficient documentation

## 2021-02-04 DIAGNOSIS — C773 Secondary and unspecified malignant neoplasm of axilla and upper limb lymph nodes: Secondary | ICD-10-CM | POA: Insufficient documentation

## 2021-02-04 DIAGNOSIS — Z171 Estrogen receptor negative status [ER-]: Secondary | ICD-10-CM

## 2021-02-04 DIAGNOSIS — C50919 Malignant neoplasm of unspecified site of unspecified female breast: Secondary | ICD-10-CM

## 2021-02-04 DIAGNOSIS — R531 Weakness: Secondary | ICD-10-CM | POA: Insufficient documentation

## 2021-02-04 DIAGNOSIS — R42 Dizziness and giddiness: Secondary | ICD-10-CM | POA: Insufficient documentation

## 2021-02-04 DIAGNOSIS — G629 Polyneuropathy, unspecified: Secondary | ICD-10-CM | POA: Diagnosis not present

## 2021-02-04 DIAGNOSIS — Z79899 Other long term (current) drug therapy: Secondary | ICD-10-CM | POA: Insufficient documentation

## 2021-02-04 LAB — COMPREHENSIVE METABOLIC PANEL
ALT: 36 U/L (ref 0–44)
AST: 22 U/L (ref 15–41)
Albumin: 3.9 g/dL (ref 3.5–5.0)
Alkaline Phosphatase: 69 U/L (ref 38–126)
Anion gap: 12 (ref 5–15)
BUN: 9 mg/dL (ref 6–20)
CO2: 25 mmol/L (ref 22–32)
Calcium: 8.9 mg/dL (ref 8.9–10.3)
Chloride: 98 mmol/L (ref 98–111)
Creatinine, Ser: 0.61 mg/dL (ref 0.44–1.00)
GFR, Estimated: >60 mL/min (ref >60)
Glucose, Bld: 96 mg/dL (ref 70–99)
Potassium: 3.4 mmol/L — ABNORMAL LOW (ref 3.5–5.1)
Sodium: 135 mmol/L (ref 135–145)
Total Bilirubin: 0.5 mg/dL (ref 0.3–1.2)
Total Protein: 7.1 g/dL (ref 6.5–8.1)

## 2021-02-04 LAB — CBC WITH DIFFERENTIAL/PLATELET
Abs Immature Granulocytes: 0.17 10*3/uL — ABNORMAL HIGH (ref 0.00–0.07)
Basophils Absolute: 0.1 10*3/uL (ref 0.0–0.1)
Basophils Relative: 1 %
Eosinophils Absolute: 0 10*3/uL (ref 0.0–0.5)
Eosinophils Relative: 1 %
HCT: 31.2 % — ABNORMAL LOW (ref 36.0–46.0)
Hemoglobin: 11.2 g/dL — ABNORMAL LOW (ref 12.0–15.0)
Immature Granulocytes: 4 %
Lymphocytes Relative: 35 %
Lymphs Abs: 1.6 10*3/uL (ref 0.7–4.0)
MCH: 32.1 pg (ref 26.0–34.0)
MCHC: 35.9 g/dL (ref 30.0–36.0)
MCV: 89.4 fL (ref 80.0–100.0)
Monocytes Absolute: 0.7 10*3/uL (ref 0.1–1.0)
Monocytes Relative: 16 %
Neutro Abs: 1.9 10*3/uL (ref 1.7–7.7)
Neutrophils Relative %: 43 %
Platelets: 241 10*3/uL (ref 150–400)
RBC: 3.49 MIL/uL — ABNORMAL LOW (ref 3.87–5.11)
RDW: 15.8 % — ABNORMAL HIGH (ref 11.5–15.5)
Smear Review: NORMAL
WBC: 4.5 10*3/uL (ref 4.0–10.5)
nRBC: 0 % (ref 0.0–0.2)

## 2021-02-04 MED ORDER — FAMOTIDINE 20 MG IN NS 100 ML IVPB
20.0000 mg | Freq: Once | INTRAVENOUS | Status: AC
  Administered 2021-02-04: 20 mg via INTRAVENOUS
  Filled 2021-02-04: qty 20
  Filled 2021-02-04: qty 100

## 2021-02-04 MED ORDER — TRIAMCINOLONE ACETONIDE 0.5 % EX OINT: 1.0000 "application " | TOPICAL_OINTMENT | Freq: Two times a day (BID) | CUTANEOUS | 0 refills | Status: DC

## 2021-02-04 MED ORDER — SODIUM CHLORIDE 0.9% FLUSH
10.0000 mL | INTRAVENOUS | Status: DC | PRN
  Administered 2021-02-04: 10 mL via INTRAVENOUS
  Filled 2021-02-04: qty 10

## 2021-02-04 MED ORDER — SODIUM CHLORIDE 0.9 % IV SOLN
225.0000 mg | Freq: Once | INTRAVENOUS | Status: AC
  Administered 2021-02-04: 230 mg via INTRAVENOUS
  Filled 2021-02-04: qty 23

## 2021-02-04 MED ORDER — SODIUM CHLORIDE 0.9% FLUSH
10.0000 mL | INTRAVENOUS | Status: DC | PRN
  Filled 2021-02-04: qty 10

## 2021-02-04 MED ORDER — EPINEPHRINE 0.3 MG/0.3ML IJ SOAJ: 0.3000 mg | Freq: Once | INTRAMUSCULAR | Status: DC | PRN

## 2021-02-04 MED ORDER — HEPARIN SOD (PORK) LOCK FLUSH 100 UNIT/ML IV SOLN
500.0000 [IU] | Freq: Once | INTRAVENOUS | Status: AC
Start: 2021-02-04 — End: 2021-02-04
  Administered 2021-02-04: 500 [IU] via INTRAVENOUS
  Filled 2021-02-04: qty 5

## 2021-02-04 MED ORDER — HEPARIN SOD (PORK) LOCK FLUSH 100 UNIT/ML IV SOLN
INTRAVENOUS | Status: AC
  Filled 2021-02-04: qty 5

## 2021-02-04 MED ORDER — SODIUM CHLORIDE 0.9 % IV SOLN
10.0000 mg | Freq: Once | INTRAVENOUS | Status: AC
  Administered 2021-02-04: 10 mg via INTRAVENOUS
  Filled 2021-02-04: qty 10

## 2021-02-04 MED ORDER — SODIUM CHLORIDE 0.9 % IV SOLN
65.0000 mg/m2 | Freq: Once | INTRAVENOUS | Status: AC
  Administered 2021-02-04: 132 mg via INTRAVENOUS
  Filled 2021-02-04: qty 22

## 2021-02-04 MED ORDER — SODIUM CHLORIDE 0.9 % IV SOLN
Freq: Once | INTRAVENOUS | Status: AC
Start: 2021-02-04 — End: 2021-02-04
  Filled 2021-02-04: qty 250

## 2021-02-04 MED ORDER — PALONOSETRON HCL INJECTION 0.25 MG/5ML
0.2500 mg | Freq: Once | INTRAVENOUS | Status: AC
  Administered 2021-02-04: 0.25 mg via INTRAVENOUS
  Filled 2021-02-04: qty 5

## 2021-02-04 MED ORDER — CLONAZEPAM 0.5 MG PO TABS: 0.5000 mg | ORAL_TABLET | Freq: Three times a day (TID) | ORAL | 0 refills | Status: DC | PRN

## 2021-02-04 MED ORDER — CILGAVIMAB (PART OF EVUSHELD) INJECTION
300.0000 mg | Freq: Once | INTRAMUSCULAR | Status: AC
  Administered 2021-02-04: 300 mg via INTRAMUSCULAR
  Filled 2021-02-04: qty 3

## 2021-02-04 MED ORDER — HEPARIN SOD (PORK) LOCK FLUSH 100 UNIT/ML IV SOLN
500.0000 [IU] | Freq: Once | INTRAVENOUS | Status: DC | PRN
  Filled 2021-02-04: qty 5

## 2021-02-04 MED ORDER — DIPHENHYDRAMINE HCL 50 MG/ML IJ SOLN
50.0000 mg | Freq: Once | INTRAMUSCULAR | Status: AC
  Administered 2021-02-04: 50 mg via INTRAVENOUS
  Filled 2021-02-04: qty 1

## 2021-02-04 MED ORDER — TIXAGEVIMAB (PART OF EVUSHELD) INJECTION
300.0000 mg | Freq: Once | INTRAMUSCULAR | Status: AC
  Administered 2021-02-04: 300 mg via INTRAMUSCULAR
  Filled 2021-02-04: qty 3

## 2021-02-04 NOTE — Patient Instructions (Signed)
[facility]  Discharge Instructions: Thank you for choosing [facility] to provide your oncology and hematology care.  If you have a lab appointment with the Cancer Center, please go directly to the Cancer Center and check in at the registration area.  Wear comfortable clothing and clothing appropriate for easy access to any Portacath or PICC line.   We strive to give you quality time with your provider. You may need to reschedule your appointment if you arrive late (15 or more minutes).  Arriving late affects you and other patients whose appointments are after yours.  Also, if you miss three or more appointments without notifying the office, you may be dismissed from the clinic at the provider's discretion.      For prescription refill requests, have your pharmacy contact our office and allow 72 hours for refills to be completed.    Today you received the following chemotherapy and/or immunotherapy agents - paclitaxel, carboplatin      To help prevent nausea and vomiting after your treatment, we encourage you to take your nausea medication as directed.  BELOW ARE SYMPTOMS THAT SHOULD BE REPORTED IMMEDIATELY: . *FEVER GREATER THAN 100.4 F (38 C) OR HIGHER . *CHILLS OR SWEATING . *NAUSEA AND VOMITING THAT IS NOT CONTROLLED WITH YOUR NAUSEA MEDICATION . *UNUSUAL SHORTNESS OF BREATH . *UNUSUAL BRUISING OR BLEEDING . *URINARY PROBLEMS (pain or burning when urinating, or frequent urination) . *BOWEL PROBLEMS (unusual diarrhea, constipation, pain near the anus) . TENDERNESS IN MOUTH AND THROAT WITH OR WITHOUT PRESENCE OF ULCERS (sore throat, sores in mouth, or a toothache) . UNUSUAL RASH, SWELLING OR PAIN  . UNUSUAL VAGINAL DISCHARGE OR ITCHING   Items with * indicate a potential emergency and should be followed up as soon as possible or go to the Emergency Department if any problems should occur.  Please show the CHEMOTHERAPY ALERT CARD or  IMMUNOTHERAPY ALERT CARD at check-in to the Emergency Department and triage nurse.  Should you have questions after your visit or need to cancel or reschedule your appointment, please contact [facility]  [phone] and follow the prompts.  Office hours are 8:00 a.m. to 4:30 p.m. Monday - Friday. Please note that voicemails left after 4:00 p.m. may not be returned until the following business day.  We are closed weekends and major holidays. You have access to a nurse at all times for urgent questions. Please call the main number to the clinic [phone] and follow the prompts.  For any non-urgent questions, you may also contact your provider using MyChart. We now offer e-Visits for anyone 18 and older to request care online for non-urgent symptoms. For details visit [email or web address].   Also download the MyChart app! Go to the app store, search "MyChart", open the app, select [facility], and log in with your MyChart username and password.  Due to Covid, a mask is required upon entering the hospital/clinic. If you do not have a mask, one will be given to you upon arrival. For doctor visits, patients may have 1 support person aged 18 or older with them. For treatment visits, patients cannot have anyone with them due to current Covid guidelines and our immunocompromised population.   Paclitaxel injection What is this medicine? PACLITAXEL (PAK li TAX el) is a chemotherapy drug. It targets fast dividing cells, like cancer cells, and causes these cells to die. This medicine is used to treat ovarian cancer, breast cancer, lung cancer, Kaposi's sarcoma,   and other cancers. This medicine may be used for other purposes; ask your health care provider or pharmacist if you have questions. COMMON BRAND NAME(S): Onxol, Taxol What should I tell my health care provider before I take this medicine? They need to know if you have any of these conditions:  history of  irregular heartbeat  liver disease  low blood counts, like low white cell, platelet, or red cell counts  lung or breathing disease, like asthma  tingling of the fingers or toes, or other nerve disorder  an unusual or allergic reaction to paclitaxel, alcohol, polyoxyethylated castor oil, other chemotherapy, other medicines, foods, dyes, or preservatives  pregnant or trying to get pregnant  breast-feeding How should I use this medicine? This drug is given as an infusion into a vein. It is administered in a hospital or clinic by a specially trained health care professional. Talk to your pediatrician regarding the use of this medicine in children. Special care may be needed. Overdosage: If you think you have taken too much of this medicine contact a poison control center or emergency room at once. NOTE: This medicine is only for you. Do not share this medicine with others. What if I miss a dose? It is important not to miss your dose. Call your doctor or health care professional if you are unable to keep an appointment. What may interact with this medicine? Do not take this medicine with any of the following medications:  live virus vaccines This medicine may also interact with the following medications:  antiviral medicines for hepatitis, HIV or AIDS  certain antibiotics like erythromycin and clarithromycin  certain medicines for fungal infections like ketoconazole and itraconazole  certain medicines for seizures like carbamazepine, phenobarbital, phenytoin  gemfibrozil  nefazodone  rifampin  St. John's wort This list may not describe all possible interactions. Give your health care provider a list of all the medicines, herbs, non-prescription drugs, or dietary supplements you use. Also tell them if you smoke, drink alcohol, or use illegal drugs. Some items may interact with your medicine. What should I watch for while using this medicine? Your condition will be monitored  carefully while you are receiving this medicine. You will need important blood work done while you are taking this medicine. This medicine can cause serious allergic reactions. To reduce your risk you will need to take other medicine(s) before treatment with this medicine. If you experience allergic reactions like skin rash, itching or hives, swelling of the face, lips, or tongue, tell your doctor or health care professional right away. In some cases, you may be given additional medicines to help with side effects. Follow all directions for their use. This drug may make you feel generally unwell. This is not uncommon, as chemotherapy can affect healthy cells as well as cancer cells. Report any side effects. Continue your course of treatment even though you feel ill unless your doctor tells you to stop. Call your doctor or health care professional for advice if you get a fever, chills or sore throat, or other symptoms of a cold or flu. Do not treat yourself. This drug decreases your body's ability to fight infections. Try to avoid being around people who are sick. This medicine may increase your risk to bruise or bleed. Call your doctor or health care professional if you notice any unusual bleeding. Be careful brushing and flossing your teeth or using a toothpick because you may get an infection or bleed more easily. If you have any dental work   done, tell your dentist you are receiving this medicine. Avoid taking products that contain aspirin, acetaminophen, ibuprofen, naproxen, or ketoprofen unless instructed by your doctor. These medicines may hide a fever. Do not become pregnant while taking this medicine. Women should inform their doctor if they wish to become pregnant or think they might be pregnant. There is a potential for serious side effects to an unborn child. Talk to your health care professional or pharmacist for more information. Do not breast-feed an infant while taking this medicine. Men are  advised not to father a child while receiving this medicine. This product may contain alcohol. Ask your pharmacist or healthcare provider if this medicine contains alcohol. Be sure to tell all healthcare providers you are taking this medicine. Certain medicines, like metronidazole and disulfiram, can cause an unpleasant reaction when taken with alcohol. The reaction includes flushing, headache, nausea, vomiting, sweating, and increased thirst. The reaction can last from 30 minutes to several hours. What side effects may I notice from receiving this medicine? Side effects that you should report to your doctor or health care professional as soon as possible:  allergic reactions like skin rash, itching or hives, swelling of the face, lips, or tongue  breathing problems  changes in vision  fast, irregular heartbeat  high or low blood pressure  mouth sores  pain, tingling, numbness in the hands or feet  signs of decreased platelets or bleeding - bruising, pinpoint red spots on the skin, black, tarry stools, blood in the urine  signs of decreased red blood cells - unusually weak or tired, feeling faint or lightheaded, falls  signs of infection - fever or chills, cough, sore throat, pain or difficulty passing urine  signs and symptoms of liver injury like dark yellow or brown urine; general ill feeling or flu-like symptoms; light-colored stools; loss of appetite; nausea; right upper belly pain; unusually weak or tired; yellowing of the eyes or skin  swelling of the ankles, feet, hands  unusually slow heartbeat Side effects that usually do not require medical attention (report to your doctor or health care professional if they continue or are bothersome):  diarrhea  hair loss  loss of appetite  muscle or joint pain  nausea, vomiting  pain, redness, or irritation at site where injected  tiredness This list may not describe all possible side effects. Call your doctor for medical  advice about side effects. You may report side effects to FDA at 1-800-FDA-1088. Where should I keep my medicine? This drug is given in a hospital or clinic and will not be stored at home. NOTE: This sheet is a summary. It may not cover all possible information. If you have questions about this medicine, talk to your doctor, pharmacist, or health care provider.  2021 Elsevier/Gold Standard (2019-08-23 13:37:23)  Carboplatin injection What is this medicine? CARBOPLATIN (KAR boe pla tin) is a chemotherapy drug. It targets fast dividing cells, like cancer cells, and causes these cells to die. This medicine is used to treat ovarian cancer and many other cancers. This medicine may be used for other purposes; ask your health care provider or pharmacist if you have questions. COMMON BRAND NAME(S): Paraplatin What should I tell my health care provider before I take this medicine? They need to know if you have any of these conditions:  blood disorders  hearing problems  kidney disease  recent or ongoing radiation therapy  an unusual or allergic reaction to carboplatin, cisplatin, other chemotherapy, other medicines, foods, dyes, or preservatives    pregnant or trying to get pregnant  breast-feeding How should I use this medicine? This drug is usually given as an infusion into a vein. It is administered in a hospital or clinic by a specially trained health care professional. Talk to your pediatrician regarding the use of this medicine in children. Special care may be needed. Overdosage: If you think you have taken too much of this medicine contact a poison control center or emergency room at once. NOTE: This medicine is only for you. Do not share this medicine with others. What if I miss a dose? It is important not to miss a dose. Call your doctor or health care professional if you are unable to keep an appointment. What may interact with this medicine?  medicines for seizures  medicines  to increase blood counts like filgrastim, pegfilgrastim, sargramostim  some antibiotics like amikacin, gentamicin, neomycin, streptomycin, tobramycin  vaccines Talk to your doctor or health care professional before taking any of these medicines:  acetaminophen  aspirin  ibuprofen  ketoprofen  naproxen This list may not describe all possible interactions. Give your health care provider a list of all the medicines, herbs, non-prescription drugs, or dietary supplements you use. Also tell them if you smoke, drink alcohol, or use illegal drugs. Some items may interact with your medicine. What should I watch for while using this medicine? Your condition will be monitored carefully while you are receiving this medicine. You will need important blood work done while you are taking this medicine. This drug may make you feel generally unwell. This is not uncommon, as chemotherapy can affect healthy cells as well as cancer cells. Report any side effects. Continue your course of treatment even though you feel ill unless your doctor tells you to stop. In some cases, you may be given additional medicines to help with side effects. Follow all directions for their use. Call your doctor or health care professional for advice if you get a fever, chills or sore throat, or other symptoms of a cold or flu. Do not treat yourself. This drug decreases your body's ability to fight infections. Try to avoid being around people who are sick. This medicine may increase your risk to bruise or bleed. Call your doctor or health care professional if you notice any unusual bleeding. Be careful brushing and flossing your teeth or using a toothpick because you may get an infection or bleed more easily. If you have any dental work done, tell your dentist you are receiving this medicine. Avoid taking products that contain aspirin, acetaminophen, ibuprofen, naproxen, or ketoprofen unless instructed by your doctor. These medicines  may hide a fever. Do not become pregnant while taking this medicine. Women should inform their doctor if they wish to become pregnant or think they might be pregnant. There is a potential for serious side effects to an unborn child. Talk to your health care professional or pharmacist for more information. Do not breast-feed an infant while taking this medicine. What side effects may I notice from receiving this medicine? Side effects that you should report to your doctor or health care professional as soon as possible:  allergic reactions like skin rash, itching or hives, swelling of the face, lips, or tongue  signs of infection - fever or chills, cough, sore throat, pain or difficulty passing urine  signs of decreased platelets or bleeding - bruising, pinpoint red spots on the skin, black, tarry stools, nosebleeds  signs of decreased red blood cells - unusually weak or tired, fainting   spells, lightheadedness  breathing problems  changes in hearing  changes in vision  chest pain  high blood pressure  low blood counts - This drug may decrease the number of white blood cells, red blood cells and platelets. You may be at increased risk for infections and bleeding.  nausea and vomiting  pain, swelling, redness or irritation at the injection site  pain, tingling, numbness in the hands or feet  problems with balance, talking, walking  trouble passing urine or change in the amount of urine Side effects that usually do not require medical attention (report to your doctor or health care professional if they continue or are bothersome):  hair loss  loss of appetite  metallic taste in the mouth or changes in taste This list may not describe all possible side effects. Call your doctor for medical advice about side effects. You may report side effects to FDA at 1-800-FDA-1088. Where should I keep my medicine? This drug is given in a hospital or clinic and will not be stored at  home. NOTE: This sheet is a summary. It may not cover all possible information. If you have questions about this medicine, talk to your doctor, pharmacist, or health care provider.  2021 Elsevier/Gold Standard (2007-12-27 14:38:05)  

## 2021-02-04 NOTE — Telephone Encounter (Signed)
She can take OTC benadryl and please send triamcinolone 0.5% cream to apply to affected areas. Call if rash is worsening.

## 2021-02-04 NOTE — Telephone Encounter (Signed)
Patient called reporting that she just got home from her infusion and is breaking out in red splotchy itching areas. She states that she took a Claritin and is asking what else she needs to do and if we can order a cream for the itching. Please advise

## 2021-02-04 NOTE — Telephone Encounter (Signed)
I called and spoke with patient who will get benadryl when she goes to get her Triamcinolone cream and acknowledges that she will call back or go to ER if condition worsens

## 2021-02-05 ENCOUNTER — Telehealth: Payer: Self-pay | Admitting: *Deleted

## 2021-02-05 NOTE — Telephone Encounter (Signed)
I called patient to see how she is doing this morning and she said that the rash is gone after taking the benadryl and using a little of the cream. She will call us back if she has any problems.

## 2021-02-06 NOTE — Telephone Encounter (Signed)
FYI

## 2021-02-08 ENCOUNTER — Encounter: Payer: Self-pay | Admitting: Oncology

## 2021-02-08 NOTE — Progress Notes (Signed)
Hematology/Oncology Consult note [facility]  Telephone:([phone] [phone]  Patient Care Team: Juluis Pitch, MD as PCP - General (Family Medicine) Sindy Guadeloupe, MD as Consulting Physician (Hematology and Oncology)   Name of the patient: Vickie Mathis  [id number]  10-30-1990   Date of visit: 02/08/21  Diagnosis-  locally advanced 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 breast cancer at least T2 N1 MX0  Chief complaint/ Reason for visit-on treatment assessment prior to cycle 8 of weekly CarboTaxol chemotherapy  Heme/Onc history:  patient is a 30 year old female who self palpated a left breast mass about 10 months ago and since then it has been slowly growing. She sought medical attention recently after thinking it was a possible cyst for all this while.She underwent a diagnostic bilateral mammogram and ultrasound which showed a 2.7 cm mass at the 12 o'clock position 6 cm from the nipple. Ultrasound of the left axilla demonstrates 2 lymph nodes with mild thickened cortices of 4 mm. There is skin thickening on the lower inner quadrant of the left breast on mammography.Both the breast mass and the lymph node were biopsied and was positive for invasive mammary carcinoma grade 3. Lymph node was also suspicious for extracapsular extension.ER/PR and HER-2 negative  MRI showed irregular enhancing mass at the 12 o'clock position of the left breast measuring 2.5 x 2.3 cm and a linear component extending 2.3 cm anteriorly. The mass in the anterior linear extension combined measure 4.3 cm. 3.9 cm in the cephalocaudal dimension. Also an area of clumped non-mass enhancement in the posterior aspect of the lower quadrant of the left breast measuring 2.6 x 0.9 cm which looks suspicious. 2 enlarged left axillary lymph nodes and 2 mildly enlarged internal mammary lymph nodes. 4.3 cm clumped area of non-mass enhancement in the outer quadrant of the right breast. 3 right  axillary lymph nodes with mild cortical thickening.  Patient underwent biopsy of the right breast non-mass enhancement andthat was negative for malignancy  CT scan showed mildly enlarged right inguinal lymph nodes nonspecific. Left upper breast mass with prominent left axillary lymph nodes. Subcentimeter pulmonary nodules which are calcified and compatible with benign old granulomatous disease. 0.6 5.4 cm lucent lesion in the left first rib possibly a hemangioma or benign lesion.   Interval history-patient reports ongoing fatigue.  Tingling numbness in her hands and feet is mild and overall stable.  ECOG PS- 1 Pain scale- 0  Review of systems- Review of Systems  Constitutional: Positive for malaise/fatigue. Negative for chills, fever and weight loss.  HENT: Negative for congestion, ear discharge and nosebleeds.   Eyes: Negative for blurred vision.  Respiratory: Negative for cough, hemoptysis, sputum production, shortness of breath and wheezing.   Cardiovascular: Negative for chest pain, palpitations, orthopnea and claudication.  Gastrointestinal: Negative for abdominal pain, blood in stool, constipation, diarrhea, heartburn, melena, nausea and vomiting.  Genitourinary: Negative for dysuria, flank pain, frequency, hematuria and urgency.  Musculoskeletal: Negative for back pain, joint pain and myalgias.  Skin: Negative for rash.  Neurological: Positive for sensory change (Peripheral neuropathy). Negative for dizziness, tingling, focal weakness, seizures, weakness and headaches.  Endo/Heme/Allergies: Does not bruise/bleed easily.  Psychiatric/Behavioral: Negative for depression and suicidal ideas. The patient does not have insomnia.      Allergies  Allergen Reactions  . Amoxicillin     Other reaction(s): "too young to remember what they do to me"  . Sulfa Antibiotics     Other reaction(s): "too young to remember what they do  to me"     Past Medical History:  Diagnosis Date   . Anxiety   . Asthma   . Breast cancer ([place])   . Depression   . Family history of cancer   . Varicose veins of bilateral lower extremities with pain      Past Surgical History:  Procedure Laterality Date  . APPENDECTOMY  2018  . BREAST BIOPSY Left 11/26/2020   vision 12:00 6cmfn path pending  . BREAST BIOPSY Left 11/26/2020   LN t3 marler path pending  . PORTACATH PLACEMENT Right 12/13/2020   Procedure: INSERTION PORT-A-CATH;  Surgeon: Herbert Pun, MD;  Location: [facility] ORS;  Service: General;  Laterality: Right;    Social History   Socioeconomic History  . Marital status: Married    Spouse name: Not on file  . Number of children: Not on file  . Years of education: Not on file  . Highest education level: Not on file  Occupational History  . Not on file  Tobacco Use  . Smoking status: Never Smoker  . Smokeless tobacco: Never Used  Vaping Use  . Vaping Use: Never used  Substance and Sexual Activity  . Alcohol use: Yes    Comment: occassinally   . Drug use: Yes    Types: Marijuana  . Sexual activity: Yes  Other Topics Concern  . Not on file  Social History Narrative  . Not on file   Social Determinants of Health   Financial Resource Strain: Not on file  Food Insecurity: Not on file  Transportation Needs: Not on file  Physical Activity: Not on file  Stress: Not on file  Social Connections: Not on file  Intimate Partner Violence: Not on file    Family History  Problem Relation Age of Onset  . Diabetes Father   . Varicose Veins Father   . Diabetes Paternal Aunt   . Uterine cancer Paternal Aunt        precancerous  . Diabetes Paternal Grandmother   . Uterine cancer Paternal Grandmother        precancerous  . Thyroid disease Mother   . Thyroid disease Maternal Aunt   . Thyroid disease Maternal Grandmother   . Cancer Other        stomach vs ovarian/cervical  . Cancer Paternal Great-grandmother        unk     Current Outpatient Medications:   .  acetaminophen (TYLENOL) 325 MG tablet, , Disp: , Rfl:  .  albuterol (VENTOLIN HFA) 108 (90 Base) MCG/ACT inhaler, Inhale 1-2 puffs into the lungs every 6 (six) hours as needed for shortness of breath or wheezing., Disp: , Rfl:  .  Ascorbic Acid (SUPER C COMPLEX PO), Take 1 capsule by mouth daily., Disp: , Rfl:  .  busPIRone (BUSPAR) 15 MG tablet, Take 15 mg by mouth 2 (two) times daily., Disp: , Rfl:  .  citalopram (CELEXA) 40 MG tablet, Take 40 mg by mouth daily., Disp: , Rfl:  .  dexamethasone (DECADRON) 4 MG tablet, Take 2 tablets (8 mg total) by mouth daily. Start the day after chemotherapy for 2 days., Disp: 30 tablet, Rfl: 1 .  ibuprofen (ADVIL) 200 MG tablet, , Disp: , Rfl:  .  clonazePAM (KLONOPIN) 0.5 MG tablet, Take 1 tablet (0.5 mg total) by mouth 3 (three) times daily as needed., Disp: 60 tablet, Rfl: 0 .  lidocaine-prilocaine (EMLA) cream, Apply to affected area once, Disp: 30 g, Rfl: 3 .  LORazepam (ATIVAN) 0.5 MG tablet,  TAKE 1 TABLET (0.5 MG TOTAL) BY MOUTH EVERY 6 (SIX) HOURS AS NEEDED (NAUSEA OR VOMITING)., Disp: 30 tablet, Rfl: 0 .  norethindrone-ethinyl estradiol (LOESTRIN FE) 1-20 MG-MCG tablet, Take 1 tablet by mouth daily. (Patient not taking: No sig reported), Disp: , Rfl:  .  ondansetron (ZOFRAN) 8 MG tablet, Take 1 tablet (8 mg total) by mouth 2 (two) times daily as needed for refractory nausea / vomiting. Start on day 3 after chemo. (Patient not taking: No sig reported), Disp: 30 tablet, Rfl: 1 .  prochlorperazine (COMPAZINE) 10 MG tablet, Take 1 tablet (10 mg total) by mouth every 6 (six) hours as needed (Nausea or vomiting). (Patient not taking: No sig reported), Disp: 30 tablet, Rfl: 1 .  triamcinolone ointment (KENALOG) 0.5 %, Apply 1 application topically 2 (two) times daily., Disp: 30 g, Rfl: 0  Physical exam:  Vitals:   02/04/21 1025  BP: (!) 102/58  Pulse: 90  Resp: 20  Temp: (!) 97.4 F (36.3 C)  TempSrc: Tympanic  SpO2: 100%  Weight: 193 lb 4.8 oz  (87.7 kg)   Physical Exam Constitutional:      General: She is not in acute distress. Cardiovascular:     Rate and Rhythm: Normal rate and regular rhythm.     Heart sounds: Normal heart sounds.  Pulmonary:     Effort: Pulmonary effort is normal.     Breath sounds: Normal breath sounds.  Abdominal:     General: Bowel sounds are normal.     Palpations: Abdomen is soft.  Skin:    General: Skin is warm and dry.  Neurological:     Mental Status: She is alert and oriented to person, place, and time.   Breast exam: No left axillary adenopathy palpable.  Palpable left axillary mass about 1 cm  CMP Latest Ref Rng & Units 02/04/2021  Glucose 70 - 99 mg/dL 96  BUN 6 - 20 mg/dL 9  Creatinine 0.44 - 1.00 mg/dL 0.61  Sodium 135 - 145 mmol/L 135  Potassium 3.5 - 5.1 mmol/L 3.4(L)  Chloride 98 - 111 mmol/L 98  CO2 22 - 32 mmol/L 25  Calcium 8.9 - 10.3 mg/dL 8.9  Total Protein 6.5 - 8.1 g/dL 7.1  Total Bilirubin 0.3 - 1.2 mg/dL 0.5  Alkaline Phos 38 - 126 U/L 69  AST 15 - 41 U/L 22  ALT 0 - 44 U/L 36   CBC Latest Ref Rng & Units 02/04/2021  WBC 4.0 - 10.5 K/uL 4.5  Hemoglobin 12.0 - 15.0 g/dL 11.2(L)  Hematocrit 36.0 - 46.0 % 31.2(L)  Platelets 150 - 400 K/uL 241     Assessment and plan- Patient is a 30 y.o. female with stage IIIb triple negative breast cancer of the left breast cT2 N1 M0.   She is here for on treatment assessment prior to cycle 8 of weekly CarboTaxol chemotherapy  Counts okay to proceed with cycle 8 of weekly CarboTaxol chemotherapy today.  Her white cell count is 4.5 today with an ANC of 1.9.  She may require Neupogen with next cycle.  She will proceed with cycle 8 today and cycle 9 next week and I will see her back in 2 weeks for cycle 10.  She will receive Keytruda at that time.  Taxol is being given at a reduced dose of 65 mg per metered square due to grade 1 peripheral neuropathy.   Patient is also on ovarian suppression during chemotherapy and will receive Lupron  in 1 week.  Visit Diagnosis 1. Encounter for antineoplastic chemotherapy   2. Invasive carcinoma of breast ([place])      Dr. Randa Evens, MD, MPH [facility] at [facility] [phone] 02/08/2021 5:20 PM

## 2021-02-11 ENCOUNTER — Inpatient Hospital Stay: Payer: 59

## 2021-02-11 ENCOUNTER — Other Ambulatory Visit: Payer: Self-pay

## 2021-02-11 VITALS — BP 103/58 | HR 73 | Temp 97.0°F | Resp 17

## 2021-02-11 DIAGNOSIS — Z5111 Encounter for antineoplastic chemotherapy: Secondary | ICD-10-CM | POA: Diagnosis not present

## 2021-02-11 DIAGNOSIS — C50919 Malignant neoplasm of unspecified site of unspecified female breast: Secondary | ICD-10-CM

## 2021-02-11 DIAGNOSIS — Z171 Estrogen receptor negative status [ER-]: Secondary | ICD-10-CM

## 2021-02-11 DIAGNOSIS — C50412 Malignant neoplasm of upper-outer quadrant of left female breast: Secondary | ICD-10-CM

## 2021-02-11 LAB — CBC WITH DIFFERENTIAL/PLATELET
Abs Immature Granulocytes: 0.09 10*3/uL — ABNORMAL HIGH (ref 0.00–0.07)
Basophils Absolute: 0.1 10*3/uL (ref 0.0–0.1)
Basophils Relative: 2 %
Eosinophils Absolute: 0.1 10*3/uL (ref 0.0–0.5)
Eosinophils Relative: 2 %
HCT: 31.7 % — ABNORMAL LOW (ref 36.0–46.0)
Hemoglobin: 11 g/dL — ABNORMAL LOW (ref 12.0–15.0)
Immature Granulocytes: 2 %
Lymphocytes Relative: 40 %
Lymphs Abs: 1.9 10*3/uL (ref 0.7–4.0)
MCH: 31.9 pg (ref 26.0–34.0)
MCHC: 34.7 g/dL (ref 30.0–36.0)
MCV: 91.9 fL (ref 80.0–100.0)
Monocytes Absolute: 0.4 10*3/uL (ref 0.1–1.0)
Monocytes Relative: 8 %
Neutro Abs: 2.2 10*3/uL (ref 1.7–7.7)
Neutrophils Relative %: 46 %
Platelets: 306 10*3/uL (ref 150–400)
RBC: 3.45 MIL/uL — ABNORMAL LOW (ref 3.87–5.11)
RDW: 16.8 % — ABNORMAL HIGH (ref 11.5–15.5)
WBC: 4.7 10*3/uL (ref 4.0–10.5)
nRBC: 0 % (ref 0.0–0.2)

## 2021-02-11 LAB — COMPREHENSIVE METABOLIC PANEL
ALT: 56 U/L — ABNORMAL HIGH (ref 0–44)
AST: 36 U/L (ref 15–41)
Albumin: 4 g/dL (ref 3.5–5.0)
Alkaline Phosphatase: 74 U/L (ref 38–126)
Anion gap: 10 (ref 5–15)
BUN: 11 mg/dL (ref 6–20)
CO2: 25 mmol/L (ref 22–32)
Calcium: 9 mg/dL (ref 8.9–10.3)
Chloride: 100 mmol/L (ref 98–111)
Creatinine, Ser: 0.69 mg/dL (ref 0.44–1.00)
GFR, Estimated: >60 mL/min (ref >60)
Glucose, Bld: 91 mg/dL (ref 70–99)
Potassium: 4 mmol/L (ref 3.5–5.1)
Sodium: 135 mmol/L (ref 135–145)
Total Bilirubin: 0.4 mg/dL (ref 0.3–1.2)
Total Protein: 7 g/dL (ref 6.5–8.1)

## 2021-02-11 MED ORDER — PROCHLORPERAZINE EDISYLATE 10 MG/2ML IJ SOLN
10.0000 mg | Freq: Once | INTRAMUSCULAR | Status: AC
Start: 2021-02-11 — End: 2021-02-11
  Administered 2021-02-11: 10 mg via INTRAVENOUS
  Filled 2021-02-11: qty 2

## 2021-02-11 MED ORDER — SODIUM CHLORIDE 0.9 % IV SOLN
10.0000 mg | Freq: Once | INTRAVENOUS | Status: AC
  Administered 2021-02-11: 10 mg via INTRAVENOUS
  Filled 2021-02-11: qty 10

## 2021-02-11 MED ORDER — SODIUM CHLORIDE 0.9 % IV SOLN: 200.0000 mg | Freq: Once | INTRAVENOUS | Status: DC

## 2021-02-11 MED ORDER — PACLITAXEL CHEMO INJECTION 300 MG/50ML
65.0000 mg/m2 | Freq: Once | INTRAVENOUS | Status: AC
  Administered 2021-02-11: 132 mg via INTRAVENOUS
  Filled 2021-02-11: qty 22

## 2021-02-11 MED ORDER — PALONOSETRON HCL INJECTION 0.25 MG/5ML
0.2500 mg | Freq: Once | INTRAVENOUS | Status: AC
Start: 2021-02-11 — End: 2021-02-11
  Administered 2021-02-11: 0.25 mg via INTRAVENOUS
  Filled 2021-02-11: qty 5

## 2021-02-11 MED ORDER — SODIUM CHLORIDE 0.9% FLUSH
10.0000 mL | INTRAVENOUS | Status: DC | PRN
Start: 2021-02-11 — End: 2021-02-11
  Filled 2021-02-11: qty 10

## 2021-02-11 MED ORDER — FAMOTIDINE 20 MG IN NS 100 ML IVPB
20.0000 mg | Freq: Once | INTRAVENOUS | Status: DC | PRN
  Filled 2021-02-11: qty 100

## 2021-02-11 MED ORDER — METHYLPREDNISOLONE SODIUM SUCC 125 MG IJ SOLR
125.0000 mg | Freq: Once | INTRAMUSCULAR | Status: AC | PRN
  Administered 2021-02-11: 125 mg via INTRAVENOUS

## 2021-02-11 MED ORDER — FAMOTIDINE IN NACL 20-0.9 MG/50ML-% IV SOLN
20.0000 mg | Freq: Once | INTRAVENOUS | Status: AC
  Administered 2021-02-11: 20 mg via INTRAVENOUS
  Filled 2021-02-11: qty 50

## 2021-02-11 MED ORDER — LEUPROLIDE ACETATE 3.75 MG IM KIT
3.7500 mg | PACK | Freq: Once | INTRAMUSCULAR | Status: AC
Start: 2021-02-11 — End: 2021-02-11
  Administered 2021-02-11: 3.75 mg via INTRAMUSCULAR
  Filled 2021-02-11: qty 3.75

## 2021-02-11 MED ORDER — SODIUM CHLORIDE 0.9 % IV SOLN
Freq: Once | INTRAVENOUS | Status: AC
  Filled 2021-02-11: qty 250

## 2021-02-11 MED ORDER — HEPARIN SOD (PORK) LOCK FLUSH 100 UNIT/ML IV SOLN
500.0000 [IU] | Freq: Once | INTRAVENOUS | Status: AC
  Administered 2021-02-11: 500 [IU] via INTRAVENOUS
  Filled 2021-02-11: qty 5

## 2021-02-11 MED ORDER — HEPARIN SOD (PORK) LOCK FLUSH 100 UNIT/ML IV SOLN
INTRAVENOUS | Status: AC
  Filled 2021-02-11: qty 5

## 2021-02-11 MED ORDER — SODIUM CHLORIDE 0.9 % IV SOLN
Freq: Once | INTRAVENOUS | Status: DC | PRN
  Filled 2021-02-11: qty 250

## 2021-02-11 MED ORDER — HEPARIN SOD (PORK) LOCK FLUSH 100 UNIT/ML IV SOLN
500.0000 [IU] | Freq: Once | INTRAVENOUS | Status: DC | PRN
  Filled 2021-02-11: qty 5

## 2021-02-11 MED ORDER — FAMOTIDINE 20 MG IN NS 100 ML IVPB
20.0000 mg | Freq: Once | INTRAVENOUS | Status: AC
  Administered 2021-02-11: 20 mg via INTRAVENOUS
  Filled 2021-02-11: qty 20

## 2021-02-11 MED ORDER — SODIUM CHLORIDE 0.9 % IV SOLN
225.0000 mg | Freq: Once | INTRAVENOUS | Status: AC
  Administered 2021-02-11: 230 mg via INTRAVENOUS
  Filled 2021-02-11: qty 23

## 2021-02-11 MED ORDER — DIPHENHYDRAMINE HCL 50 MG/ML IJ SOLN
50.0000 mg | Freq: Once | INTRAMUSCULAR | Status: AC
  Administered 2021-02-11: 50 mg via INTRAVENOUS
  Filled 2021-02-11: qty 1

## 2021-02-11 MED ORDER — DIPHENHYDRAMINE HCL 50 MG/ML IJ SOLN
50.0000 mg | Freq: Once | INTRAMUSCULAR | Status: AC | PRN
  Administered 2021-02-11: 25 mg via INTRAVENOUS

## 2021-02-11 NOTE — Progress Notes (Signed)
1325: Pt just finished eating lunch and reports that "my tummy does not feel right, I need to go to the bathroom". Carboplatin paused at this time. No other s/s noted, pt denies any other complaints. Pt reports to [facility], Dr. Janese Banks aware. Per Dr. Janese Banks okay to give Compazine !0 mg IV if pt is experiencing Nausea, monitor pt 10 minutes prior to restarting.   1328: This nurse in [place] with pt, Pt reports feeling hot and SOB. Flushing noted to chest, face, neck and arms/hands. Pt having nausea and vomiting. .  NS stared and Dr. Janese Banks aware.  1332: Dr/ Janese Banks in [place] with pt and staff  1336: Compazine given per Dr. Janese Banks  1340: Pt States she is feeling better but "hot and cold"  1350: Pt back to chair with staff assist.  1400: Pt reports all symptoms have resolved. Flushing has improved and only noted to bilateral hands.  1515: All symptoms resolved and Per Janese Banks okay to discharge pt home, Pt to have someone stay with her all night and call clinic with any concerns or questions, or report to ER in the event of an emergency. Pt verbalizes understanding and states she feels at baseline with no concerns at this time.   1540: Pt stable, no s/s of distress noted. VS stable. Pt denies any concerns at this time.  ** see Flow sheet for VS (DR Janese Banks aware of VS during this time)** **see MAR for medications given**.

## 2021-02-11 NOTE — Patient Instructions (Signed)
[facility]  Discharge Instructions: Thank you for choosing [facility] to provide your oncology and hematology care.  If you have a lab appointment with the Cancer Center, please go directly to the Cancer Center and check in at the registration area.  Wear comfortable clothing and clothing appropriate for easy access to any Portacath or PICC line.   We strive to give you quality time with your provider. You may need to reschedule your appointment if you arrive late (15 or more minutes).  Arriving late affects you and other patients whose appointments are after yours.  Also, if you miss three or more appointments without notifying the office, you may be dismissed from the clinic at the provider's discretion.      For prescription refill requests, have your pharmacy contact our office and allow 72 hours for refills to be completed.    Today you received the following chemotherapy and/or immunotherapy agents Taxol and Carboplatin       To help prevent nausea and vomiting after your treatment, we encourage you to take your nausea medication as directed.  BELOW ARE SYMPTOMS THAT SHOULD BE REPORTED IMMEDIATELY: *FEVER GREATER THAN 100.4 F (38 C) OR HIGHER *CHILLS OR SWEATING *NAUSEA AND VOMITING THAT IS NOT CONTROLLED WITH YOUR NAUSEA MEDICATION *UNUSUAL SHORTNESS OF BREATH *UNUSUAL BRUISING OR BLEEDING *URINARY PROBLEMS (pain or burning when urinating, or frequent urination) *BOWEL PROBLEMS (unusual diarrhea, constipation, pain near the anus) TENDERNESS IN MOUTH AND THROAT WITH OR WITHOUT PRESENCE OF ULCERS (sore throat, sores in mouth, or a toothache) UNUSUAL RASH, SWELLING OR PAIN  UNUSUAL VAGINAL DISCHARGE OR ITCHING   Items with * indicate a potential emergency and should be followed up as soon as possible or go to the Emergency Department if any problems should occur.  Please show the CHEMOTHERAPY ALERT CARD or IMMUNOTHERAPY ALERT CARD  at check-in to the Emergency Department and triage nurse.  Should you have questions after your visit or need to cancel or reschedule your appointment, please contact [facility] [place] [facility]  [phone] and follow the prompts.  Office hours are 8:00 a.m. to 4:30 p.m. Monday - Friday. Please note that voicemails left after 4:00 p.m. may not be returned until the following business day.  We are closed weekends and major holidays. You have access to a nurse at all times for urgent questions. Please call the main number to the clinic [phone] and follow the prompts.  For any non-urgent questions, you may also contact your provider using MyChart. We now offer e-Visits for anyone 18 and older to request care online for non-urgent symptoms. For details visit [email or web address].   Also download the MyChart app! Go to the app store, search "MyChart", open the app, select [facility], and log in with your MyChart username and password.  Due to Covid, a mask is required upon entering the hospital/clinic. If you do not have a mask, one will be given to you upon arrival. For doctor visits, patients may have 1 support person aged 18 or older with them. For treatment visits, patients cannot have anyone with them due to current Covid guidelines and our immunocompromised population.  

## 2021-02-12 ENCOUNTER — Encounter: Payer: Self-pay | Admitting: *Deleted

## 2021-02-12 ENCOUNTER — Inpatient Hospital Stay: Payer: 59

## 2021-02-12 ENCOUNTER — Telehealth: Payer: Self-pay

## 2021-02-12 ENCOUNTER — Telehealth: Payer: Self-pay | Admitting: *Deleted

## 2021-02-12 DIAGNOSIS — C50919 Malignant neoplasm of unspecified site of unspecified female breast: Secondary | ICD-10-CM

## 2021-02-12 MED ORDER — FILGRASTIM 480 MCG/0.8ML IJ SOSY: 480.0000 ug | PREFILLED_SYRINGE | Freq: Every day | INTRAMUSCULAR | Status: DC

## 2021-02-12 NOTE — Telephone Encounter (Signed)
Attempted to contact pt post reaction that occurred 02/11/21. No answer, VM left.

## 2021-02-12 NOTE — Telephone Encounter (Addendum)
Called pt to let her know that she does not need injection for tomorrow. Marland Kitchen

## 2021-02-12 NOTE — Telephone Encounter (Signed)
Pt had reaction in chemo and I wanted to make sure she was feeling better and she was feeling back to normal she said

## 2021-02-13 ENCOUNTER — Inpatient Hospital Stay: Payer: 59

## 2021-02-18 ENCOUNTER — Other Ambulatory Visit: Payer: Self-pay | Admitting: *Deleted

## 2021-02-18 ENCOUNTER — Encounter: Payer: Self-pay | Admitting: Oncology

## 2021-02-18 ENCOUNTER — Inpatient Hospital Stay: Payer: 59

## 2021-02-18 ENCOUNTER — Inpatient Hospital Stay (HOSPITAL_BASED_OUTPATIENT_CLINIC_OR_DEPARTMENT_OTHER): Payer: 59 | Admitting: Oncology

## 2021-02-18 VITALS — BP 129/78 | HR 78 | Temp 99.0°F | Wt 205.1 lb

## 2021-02-18 DIAGNOSIS — C50919 Malignant neoplasm of unspecified site of unspecified female breast: Secondary | ICD-10-CM | POA: Diagnosis not present

## 2021-02-18 DIAGNOSIS — C50412 Malignant neoplasm of upper-outer quadrant of left female breast: Secondary | ICD-10-CM

## 2021-02-18 DIAGNOSIS — Z171 Estrogen receptor negative status [ER-]: Secondary | ICD-10-CM

## 2021-02-18 DIAGNOSIS — D6481 Anemia due to antineoplastic chemotherapy: Secondary | ICD-10-CM

## 2021-02-18 DIAGNOSIS — Z5111 Encounter for antineoplastic chemotherapy: Secondary | ICD-10-CM

## 2021-02-18 DIAGNOSIS — Z5112 Encounter for antineoplastic immunotherapy: Secondary | ICD-10-CM | POA: Diagnosis not present

## 2021-02-18 DIAGNOSIS — T451X5A Adverse effect of antineoplastic and immunosuppressive drugs, initial encounter: Secondary | ICD-10-CM

## 2021-02-18 DIAGNOSIS — R635 Abnormal weight gain: Secondary | ICD-10-CM

## 2021-02-18 LAB — CBC WITH DIFFERENTIAL/PLATELET
Abs Immature Granulocytes: 0.21 10*3/uL — ABNORMAL HIGH (ref 0.00–0.07)
Basophils Absolute: 0.1 10*3/uL (ref 0.0–0.1)
Basophils Relative: 1 %
Eosinophils Absolute: 0.2 10*3/uL (ref 0.0–0.5)
Eosinophils Relative: 3 %
HCT: 31.2 % — ABNORMAL LOW (ref 36.0–46.0)
Hemoglobin: 10.8 g/dL — ABNORMAL LOW (ref 12.0–15.0)
Immature Granulocytes: 4 %
Lymphocytes Relative: 37 %
Lymphs Abs: 2 10*3/uL (ref 0.7–4.0)
MCH: 32.8 pg (ref 26.0–34.0)
MCHC: 34.6 g/dL (ref 30.0–36.0)
MCV: 94.8 fL (ref 80.0–100.0)
Monocytes Absolute: 0.5 10*3/uL (ref 0.1–1.0)
Monocytes Relative: 10 %
Neutro Abs: 2.5 10*3/uL (ref 1.7–7.7)
Neutrophils Relative %: 45 %
Platelets: 253 10*3/uL (ref 150–400)
RBC: 3.29 MIL/uL — ABNORMAL LOW (ref 3.87–5.11)
RDW: 18.2 % — ABNORMAL HIGH (ref 11.5–15.5)
WBC: 5.4 10*3/uL (ref 4.0–10.5)
nRBC: 0 % (ref 0.0–0.2)

## 2021-02-18 LAB — COMPREHENSIVE METABOLIC PANEL
ALT: 32 U/L (ref 0–44)
AST: 22 U/L (ref 15–41)
Albumin: 3.7 g/dL (ref 3.5–5.0)
Alkaline Phosphatase: 65 U/L (ref 38–126)
Anion gap: 11 (ref 5–15)
BUN: 12 mg/dL (ref 6–20)
CO2: 24 mmol/L (ref 22–32)
Calcium: 8.8 mg/dL — ABNORMAL LOW (ref 8.9–10.3)
Chloride: 102 mmol/L (ref 98–111)
Creatinine, Ser: 0.69 mg/dL (ref 0.44–1.00)
GFR, Estimated: >60 mL/min (ref >60)
Glucose, Bld: 93 mg/dL (ref 70–99)
Potassium: 3.8 mmol/L (ref 3.5–5.1)
Sodium: 137 mmol/L (ref 135–145)
Total Bilirubin: 0.3 mg/dL (ref 0.3–1.2)
Total Protein: 6.3 g/dL — ABNORMAL LOW (ref 6.5–8.1)

## 2021-02-18 LAB — TSH: TSH: 2.164 u[IU]/mL (ref 0.350–4.500)

## 2021-02-18 MED ORDER — SODIUM CHLORIDE 0.9 % IV SOLN
65.0000 mg/m2 | Freq: Once | INTRAVENOUS | Status: AC
  Administered 2021-02-18: 132 mg via INTRAVENOUS
  Filled 2021-02-18: qty 22

## 2021-02-18 MED ORDER — SODIUM CHLORIDE 0.9% FLUSH
10.0000 mL | INTRAVENOUS | Status: DC | PRN
Start: 2021-02-18 — End: 2021-02-18
  Administered 2021-02-18: 10 mL
  Filled 2021-02-18: qty 10

## 2021-02-18 MED ORDER — PALONOSETRON HCL INJECTION 0.25 MG/5ML
0.2500 mg | Freq: Once | INTRAVENOUS | Status: AC
  Administered 2021-02-18: 0.25 mg via INTRAVENOUS
  Filled 2021-02-18: qty 5

## 2021-02-18 MED ORDER — SODIUM CHLORIDE 0.9 % IV SOLN
Freq: Once | INTRAVENOUS | Status: AC
  Filled 2021-02-18: qty 250

## 2021-02-18 MED ORDER — HEPARIN SOD (PORK) LOCK FLUSH 100 UNIT/ML IV SOLN
INTRAVENOUS | Status: AC
  Filled 2021-02-18: qty 5

## 2021-02-18 MED ORDER — SODIUM CHLORIDE 0.9 % IV SOLN
200.0000 mg | Freq: Once | INTRAVENOUS | Status: AC
  Administered 2021-02-18: 200 mg via INTRAVENOUS
  Filled 2021-02-18: qty 8

## 2021-02-18 MED ORDER — FUROSEMIDE 20 MG PO TABS: 20.0000 mg | ORAL_TABLET | Freq: Every day | ORAL | 0 refills | Status: DC

## 2021-02-18 MED ORDER — HEPARIN SOD (PORK) LOCK FLUSH 100 UNIT/ML IV SOLN
500.0000 [IU] | Freq: Once | INTRAVENOUS | Status: AC | PRN
  Administered 2021-02-18: 500 [IU]
  Filled 2021-02-18: qty 5

## 2021-02-18 MED ORDER — DIPHENHYDRAMINE HCL 50 MG/ML IJ SOLN
50.0000 mg | Freq: Once | INTRAMUSCULAR | Status: AC
  Administered 2021-02-18: 50 mg via INTRAVENOUS
  Filled 2021-02-18: qty 1

## 2021-02-18 MED ORDER — SODIUM CHLORIDE 0.9 % IV SOLN
10.0000 mg | Freq: Once | INTRAVENOUS | Status: AC
  Administered 2021-02-18: 10 mg via INTRAVENOUS
  Filled 2021-02-18: qty 10

## 2021-02-18 MED ORDER — FAMOTIDINE 20 MG IN NS 100 ML IVPB
20.0000 mg | Freq: Once | INTRAVENOUS | Status: AC
  Administered 2021-02-18: 20 mg via INTRAVENOUS
  Filled 2021-02-18: qty 20

## 2021-02-18 NOTE — Addendum Note (Signed)
Addended by: Kern Alberta on: 02/18/2021 02:30 PM   Modules accepted: Orders

## 2021-02-18 NOTE — Progress Notes (Signed)
Hematology/Oncology Consult note [facility]  [phone] [phone]  Patient Care Team: Dorothey Baseman, MD as PCP - General (Family Medicine) Creig Hines, MD as Consulting Physician (Hematology and Oncology)   Name of the patient: Vickie Mathis  [id number]  [birth year]   Date of visit: 02/18/21  Diagnosis- locally advanced 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 breast cancer at least T2 N1 M0  Chief complaint/ Reason for visit-on treatment assessment prior to cycle 10 of weekly Taxol chemotherapy along with Keytruda  Heme/Onc history: patient is a 30 year old female who self palpated a left breast mass about 10 months ago and since then it has been slowly growing. She sought medical attention recently after thinking it was a possible cyst for all this while.She underwent a diagnostic bilateral mammogram and ultrasound which showed a 2.7 cm mass at the 12 o'clock position 6 cm from the nipple. Ultrasound of the left axilla demonstrates 2 lymph nodes with mild thickened cortices of 4 mm. There is skin thickening on the lower inner quadrant of the left breast on mammography.Both the breast mass and the lymph node were biopsied and was positive for invasive mammary carcinoma grade 3. Lymph node was also suspicious for extracapsular extension.ER/PR and HER-2 negative  MRI showed irregular enhancing mass at the 12 o'clock position of the left breast measuring 2.5 x 2.3 cm and a linear component extending 2.3 cm anteriorly. The mass in the anterior linear extension combined measure 4.3 cm. 3.9 cm in the cephalocaudal dimension. Also an area of clumped non-mass enhancement in the posterior aspect of the lower quadrant of the left breast measuring 2.6 x 0.9 cm which looks suspicious. 2 enlarged left axillary lymph nodes and 2 mildly enlarged internal mammary lymph nodes. 4.3 cm clumped area of non-mass enhancement in the outer quadrant of the right  breast. 3 right axillary lymph nodes with mild cortical thickening.  Patient underwent biopsy of the right breast non-mass enhancement andthat was negative for malignancy  CT scan showed mildly enlarged right inguinal lymph nodes nonspecific. Left upper breast mass with prominent left axillary lymph nodes. Subcentimeter pulmonary nodules which are calcified and compatible with benign old granulomatous disease. 0.6 5.4 cm lucent lesion in the left first rib possibly a hemangioma or benign lesion.  Patient developed significant infusion reaction to carboplatin with dose 9 when her blood pressure dropped and she became tachycardic and significantly nauseous.  Interval history-patient reports having gained 12 pounds in the last 1 week.  She feels bloated and has not changed her diet habits much.  Reports ongoing fatigue.  Tingling numbness in her hands and feet is intermittent and overall stable  ECOG PS- 1 Pain scale- 0   Review of systems- Review of Systems  Constitutional: Positive for malaise/fatigue. Negative for chills, fever and weight loss.  HENT: Negative for congestion, ear discharge and nosebleeds.   Eyes: Negative for blurred vision.  Respiratory: Negative for cough, hemoptysis, sputum production, shortness of breath and wheezing.   Cardiovascular: Negative for chest pain, palpitations, orthopnea and claudication.  Gastrointestinal: Negative for abdominal pain, blood in stool, constipation, diarrhea, heartburn, melena, nausea and vomiting.  Genitourinary: Negative for dysuria, flank pain, frequency, hematuria and urgency.  Musculoskeletal: Negative for back pain, joint pain and myalgias.  Skin: Negative for rash.  Neurological: Positive for sensory change (Peripheral neuropathy). Negative for dizziness, tingling, focal weakness, seizures, weakness and headaches.  Endo/Heme/Allergies: Does not bruise/bleed easily.  Psychiatric/Behavioral: Negative for depression and suicidal  ideas. The patient  does not have insomnia.       Allergies  Allergen Reactions  . Carboplatin Shortness Of Breath, Nausea And Vomiting and Other (See Comments)    Flushing- chest , face, neck , arm including hand  . Amoxicillin     Other reaction(s): "too young to remember what they do to me"  . Sulfa Antibiotics     Other reaction(s): "too young to remember what they do to me"     Past Medical History:  Diagnosis Date  . Anxiety   . Asthma   . Breast cancer ([place])   . Depression   . Family history of cancer   . Varicose veins of bilateral lower extremities with pain      Past Surgical History:  Procedure Laterality Date  . APPENDECTOMY  2018  . BREAST BIOPSY Left 11/26/2020   vision 12:00 6cmfn path pending  . BREAST BIOPSY Left 11/26/2020   LN t3 marler path pending  . PORTACATH PLACEMENT Right 12/13/2020   Procedure: INSERTION PORT-A-CATH;  Surgeon: Herbert Pun, MD;  Location: [facility] ORS;  Service: General;  Laterality: Right;    Social History   Socioeconomic History  . Marital status: Married    Spouse name: Not on file  . Number of children: Not on file  . Years of education: Not on file  . Highest education level: Not on file  Occupational History  . Not on file  Tobacco Use  . Smoking status: Never Smoker  . Smokeless tobacco: Never Used  Vaping Use  . Vaping Use: Never used  Substance and Sexual Activity  . Alcohol use: Yes    Comment: occassinally   . Drug use: Yes    Types: Marijuana  . Sexual activity: Yes  Other Topics Concern  . Not on file  Social History Narrative  . Not on file   Social Determinants of Health   Financial Resource Strain: Not on file  Food Insecurity: Not on file  Transportation Needs: Not on file  Physical Activity: Not on file  Stress: Not on file  Social Connections: Not on file  Intimate Partner Violence: Not on file    Family History  Problem Relation Age of Onset  . Diabetes Father   . Varicose  Veins Father   . Diabetes Paternal Aunt   . Uterine cancer Paternal Aunt        precancerous  . Diabetes Paternal Grandmother   . Uterine cancer Paternal Grandmother        precancerous  . Thyroid disease Mother   . Thyroid disease Maternal Aunt   . Thyroid disease Maternal Grandmother   . Cancer Other        stomach vs ovarian/cervical  . Cancer Paternal Great-grandmother        unk     Current Outpatient Medications:  .  acetaminophen (TYLENOL) 325 MG tablet, , Disp: , Rfl:  .  albuterol (VENTOLIN HFA) 108 (90 Base) MCG/ACT inhaler, Inhale 1-2 puffs into the lungs every 6 (six) hours as needed for shortness of breath or wheezing., Disp: , Rfl:  .  Ascorbic Acid (SUPER C COMPLEX PO), Take 1 capsule by mouth daily., Disp: , Rfl:  .  busPIRone (BUSPAR) 15 MG tablet, Take 15 mg by mouth 2 (two) times daily., Disp: , Rfl:  .  citalopram (CELEXA) 40 MG tablet, Take 40 mg by mouth daily., Disp: , Rfl:  .  clonazePAM (KLONOPIN) 0.5 MG tablet, Take 1 tablet (0.5 mg total) by mouth 3 (  three) times daily as needed., Disp: 60 tablet, Rfl: 0 .  dexamethasone (DECADRON) 4 MG tablet, Take 2 tablets (8 mg total) by mouth daily. Start the day after chemotherapy for 2 days., Disp: 30 tablet, Rfl: 1 .  ibuprofen (ADVIL) 200 MG tablet, , Disp: , Rfl:  .  lidocaine-prilocaine (EMLA) cream, Apply to affected area once, Disp: 30 g, Rfl: 3 .  LORazepam (ATIVAN) 0.5 MG tablet, TAKE 1 TABLET (0.5 MG TOTAL) BY MOUTH EVERY 6 (SIX) HOURS AS NEEDED (NAUSEA OR VOMITING)., Disp: 30 tablet, Rfl: 0 .  furosemide (LASIX) 20 MG tablet, Take 1 tablet (20 mg total) by mouth daily., Disp: 5 tablet, Rfl: 0 .  norethindrone-ethinyl estradiol (LOESTRIN FE) 1-20 MG-MCG tablet, Take 1 tablet by mouth daily. (Patient not taking: No sig reported), Disp: , Rfl:  .  ondansetron (ZOFRAN) 8 MG tablet, Take 1 tablet (8 mg total) by mouth 2 (two) times daily as needed for refractory nausea / vomiting. Start on day 3 after chemo.  (Patient not taking: No sig reported), Disp: 30 tablet, Rfl: 1 .  prochlorperazine (COMPAZINE) 10 MG tablet, Take 1 tablet (10 mg total) by mouth every 6 (six) hours as needed (Nausea or vomiting). (Patient not taking: No sig reported), Disp: 30 tablet, Rfl: 1 .  triamcinolone ointment (KENALOG) 0.5 %, Apply 1 application topically 2 (two) times daily. (Patient not taking: Reported on 02/18/2021), Disp: 30 g, Rfl: 0 No current facility-administered medications for this visit.  Facility-Administered Medications Ordered in Other Visits:  .  heparin lock flush 100 unit/mL, 500 Units, Intracatheter, Once PRN, Sindy Guadeloupe, MD .  PACLitaxel (TAXOL) 132 mg in sodium chloride 0.9 % 250 mL chemo infusion (</= $RemoveBefor'80mg'drUjrJgjbigj$ /m2), 65 mg/m2 (Treatment Plan Recorded), Intravenous, Once, Sindy Guadeloupe, MD .  pembrolizumab [facility]) 200 mg in sodium chloride 0.9 % 50 mL chemo infusion, 200 mg, Intravenous, Once, Sindy Guadeloupe, MD, Last Rate: 116 mL/hr at 02/18/21 1223, 200 mg at 02/18/21 1223 .  sodium chloride flush (NS) 0.9 % injection 10 mL, 10 mL, Intracatheter, PRN, Sindy Guadeloupe, MD, 10 mL at 02/18/21 1000  Physical exam:  Vitals:   02/18/21 1026  BP: 129/78  Pulse: 78  Temp: 99 F (37.2 C)  TempSrc: Tympanic  SpO2: 100%  Weight: 205 lb 1.6 oz (93 kg)   Physical Exam Constitutional:      General: She is not in acute distress. Cardiovascular:     Rate and Rhythm: Normal rate and regular rhythm.     Heart sounds: Normal heart sounds.  Pulmonary:     Effort: Pulmonary effort is normal.     Breath sounds: Normal breath sounds.  Abdominal:     General: Bowel sounds are normal.     Palpations: Abdomen is soft.  Skin:    General: Skin is warm and dry.  Neurological:     Mental Status: She is alert and oriented to person, place, and time.      CMP Latest Ref Rng & Units 02/18/2021  Glucose 70 - 99 mg/dL 93  BUN 6 - 20 mg/dL 12  Creatinine 0.44 - 1.00 mg/dL 0.69  Sodium 135 - 145 mmol/L 137   Potassium 3.5 - 5.1 mmol/L 3.8  Chloride 98 - 111 mmol/L 102  CO2 22 - 32 mmol/L 24  Calcium 8.9 - 10.3 mg/dL 8.8(L)  Total Protein 6.5 - 8.1 g/dL 6.3(L)  Total Bilirubin 0.3 - 1.2 mg/dL 0.3  Alkaline Phos 38 - 126 U/L 65  AST 15 - 41 U/L 22  ALT 0 - 44 U/L 32   CBC Latest Ref Rng & Units 02/18/2021  WBC 4.0 - 10.5 K/uL 5.4  Hemoglobin 12.0 - 15.0 g/dL 10.8(L)  Hematocrit 36.0 - 46.0 % 31.2(L)  Platelets 150 - 400 K/uL 253     Assessment and plan- Patient is a 30 y.o. female with stage IIIb triple negative breast cancer of the left breast cT2 N1 M0.She is here for on treatment assessment prior to cycle 10 of weekly Taxol chemotherapy with Beryle Flock  Given the significant reaction to carboplatin she will not be getting any further carboplatin at this time.  Taxol and Keytruda chemotherapy today cycle 10. She will directly proceed for Taxol next week.  Return to clinic in 2 weeks.  See covering MD/NP and receives Taxol alone which will be her last weekly cycle of Taxol  Following that we will obtain interim right breast ultrasound to assess response to treatment.  She will also get a baseline echocardiogram prior to starting [facility] Keytruda chemotherapy  I will see her back on 03/18/2021 to start first cycle of [facility] Keytruda chemotherapy which she will be getting every 3 weeks for 4 cycles per keynote 522 regimen  Patient is leaning towards a bilateral mastectomy with reconstruction I will also refer her to Dr. Allena Earing for this  Weight gain: Possible fluid retention.  I will give her a trial of Lasix 20 mg for 5 days  Chemo induced anemia: Mild continue to monitor.  Check ferritin and iron studies B12 and folate with next set of labs  Patient is also on ovarian suppression while on chemotherapy and will get her next dose of Lupron on 03/18/2021    Visit Diagnosis 1. Encounter for antineoplastic chemotherapy   2. Encounter for antineoplastic immunotherapy   3. Invasive carcinoma of  breast ([place])   4. Antineoplastic chemotherapy induced anemia   5. Weight gain      Dr. Randa Evens, MD, MPH [facility] [phone] 02/18/2021 12:35 PM

## 2021-02-18 NOTE — Addendum Note (Signed)
Addended by: Kern Alberta on: 02/18/2021 03:13 PM   Modules accepted: Orders

## 2021-02-18 NOTE — Patient Instructions (Signed)
[facility]    Discharge Instructions: Thank you for choosing [facility] to provide your oncology and hematology care.  If you have a lab appointment with the [facility], please go directly to the [facility] and check in at the registration area.  Wear comfortable clothing and clothing appropriate for easy access to any Portacath or PICC line.   We strive to give you quality time with your provider. You may need to reschedule your appointment if you arrive late (15 or more minutes).  Arriving late affects you and other patients whose appointments are after yours.  Also, if you miss three or more appointments without notifying the office, you may be dismissed from the clinic at the provider's discretion.      For prescription refill requests, have your pharmacy contact our office and allow 72 hours for refills to be completed.    Today you received the following chemotherapy and/or immunotherapy agents - taxol, Beryle Flock     To help prevent nausea and vomiting after your treatment, we encourage you to take your nausea medication as directed.  BELOW ARE SYMPTOMS THAT SHOULD BE REPORTED IMMEDIATELY: . *FEVER GREATER THAN 100.4 F (38 C) OR HIGHER . *CHILLS OR SWEATING . *NAUSEA AND VOMITING THAT IS NOT CONTROLLED WITH YOUR NAUSEA MEDICATION . *UNUSUAL SHORTNESS OF BREATH . *UNUSUAL BRUISING OR BLEEDING . *URINARY PROBLEMS (pain or burning when urinating, or frequent urination) . *BOWEL PROBLEMS (unusual diarrhea, constipation, pain near the anus) . TENDERNESS IN MOUTH AND THROAT WITH OR WITHOUT PRESENCE OF ULCERS (sore throat, sores in mouth, or a toothache) . UNUSUAL RASH, SWELLING OR PAIN  . UNUSUAL VAGINAL DISCHARGE OR ITCHING   Items with * indicate a potential emergency and should be followed up as soon as possible or go to the Emergency Department if any problems should occur.  Please show the CHEMOTHERAPY ALERT CARD or  IMMUNOTHERAPY ALERT CARD at check-in to the Emergency Department and triage nurse.  Should you have questions after your visit or need to cancel or reschedule your appointment, please contact [facility]  [phone] and follow the prompts.  Office hours are 8:00 a.m. to 4:30 p.m. Monday - Friday. Please note that voicemails left after 4:00 p.m. may not be returned until the following business day.  We are closed weekends and major holidays. You have access to a nurse at all times for urgent questions. Please call the main number to the clinic [phone] and follow the prompts.  For any non-urgent questions, you may also contact your provider using MyChart. We now offer e-Visits for anyone 88 and older to request care online for non-urgent symptoms. For details visit [email or web address].   Also download the MyChart app! Go to the app store, search "MyChart", open the app, select , and log in with your MyChart username and password.  Due to Covid, a mask is required upon entering the hospital/clinic. If you do not have a mask, one will be given to you upon arrival. For doctor visits, patients may have 1 support person aged 17 or older with them. For treatment visits, patients cannot have anyone with them due to current Covid guidelines and our immunocompromised population.   Paclitaxel injection What is this medicine? PACLITAXEL (PAK li TAX el) is a chemotherapy drug. It targets fast dividing cells, like cancer cells, and causes these cells to die. This medicine is used to treat ovarian cancer, breast cancer, lung cancer, Kaposi's  sarcoma, and other cancers. This medicine may be used for other purposes; ask your health care provider or pharmacist if you have questions. COMMON BRAND NAME(S): Onxol, Taxol What should I tell my health care provider before I take this medicine? They need to know if you have any of these conditions:  history of  irregular heartbeat  liver disease  low blood counts, like low Vickie Mathis cell, platelet, or red cell counts  lung or breathing disease, like asthma  tingling of the fingers or toes, or other nerve disorder  an unusual or allergic reaction to paclitaxel, alcohol, polyoxyethylated castor oil, other chemotherapy, other medicines, foods, dyes, or preservatives  pregnant or trying to get pregnant  breast-feeding How should I use this medicine? This drug is given as an infusion into a vein. It is administered in a hospital or clinic by a specially trained health care professional. Talk to your pediatrician regarding the use of this medicine in children. Special care may be needed. Overdosage: If you think you have taken too much of this medicine contact a poison control center or emergency room at once. NOTE: This medicine is only for you. Do not share this medicine with others. What if I miss a dose? It is important not to miss your dose. Call your doctor or health care professional if you are unable to keep an appointment. What may interact with this medicine? Do not take this medicine with any of the following medications:  live virus vaccines This medicine may also interact with the following medications:  antiviral medicines for hepatitis, HIV or AIDS  certain antibiotics like erythromycin and clarithromycin  certain medicines for fungal infections like ketoconazole and itraconazole  certain medicines for seizures like carbamazepine, phenobarbital, phenytoin  gemfibrozil  nefazodone  rifampin  St. John's wort This list may not describe all possible interactions. Give your health care provider a list of all the medicines, herbs, non-prescription drugs, or dietary supplements you use. Also tell them if you smoke, drink alcohol, or use illegal drugs. Some items may interact with your medicine. What should I watch for while using this medicine? Your condition will be monitored  carefully while you are receiving this medicine. You will need important blood work done while you are taking this medicine. This medicine can cause serious allergic reactions. To reduce your risk you will need to take other medicine(s) before treatment with this medicine. If you experience allergic reactions like skin rash, itching or hives, swelling of the face, lips, or tongue, tell your doctor or health care professional right away. In some cases, you may be given additional medicines to help with side effects. Follow all directions for their use. This drug may make you feel generally unwell. This is not uncommon, as chemotherapy can affect healthy cells as well as cancer cells. Report any side effects. Continue your course of treatment even though you feel ill unless your doctor tells you to stop. Call your doctor or health care professional for advice if you get a fever, chills or sore throat, or other symptoms of a cold or flu. Do not treat yourself. This drug decreases your body's ability to fight infections. Try to avoid being around people who are sick. This medicine may increase your risk to bruise or bleed. Call your doctor or health care professional if you notice any unusual bleeding. Be careful brushing and flossing your teeth or using a toothpick because you may get an infection or bleed more easily. If you have any dental  work done, tell your dentist you are receiving this medicine. Avoid taking products that contain aspirin, acetaminophen, ibuprofen, naproxen, or ketoprofen unless instructed by your doctor. These medicines may hide a fever. Do not become pregnant while taking this medicine. Women should inform their doctor if they wish to become pregnant or think they might be pregnant. There is a potential for serious side effects to an unborn child. Talk to your health care professional or pharmacist for more information. Do not breast-feed an infant while taking this medicine. Men are  advised not to father a child while receiving this medicine. This product may contain alcohol. Ask your pharmacist or healthcare provider if this medicine contains alcohol. Be sure to tell all healthcare providers you are taking this medicine. Certain medicines, like metronidazole and disulfiram, can cause an unpleasant reaction when taken with alcohol. The reaction includes flushing, headache, nausea, vomiting, sweating, and increased thirst. The reaction can last from 30 minutes to several hours. What side effects may I notice from receiving this medicine? Side effects that you should report to your doctor or health care professional as soon as possible:  allergic reactions like skin rash, itching or hives, swelling of the face, lips, or tongue  breathing problems  changes in vision  fast, irregular heartbeat  high or low blood pressure  mouth sores  pain, tingling, numbness in the hands or feet  signs of decreased platelets or bleeding - bruising, pinpoint red spots on the skin, black, tarry stools, blood in the urine  signs of decreased red blood cells - unusually weak or tired, feeling faint or lightheaded, falls  signs of infection - fever or chills, cough, sore throat, pain or difficulty passing urine  signs and symptoms of liver injury like dark yellow or brown urine; general ill feeling or flu-like symptoms; light-colored stools; loss of appetite; nausea; right upper belly pain; unusually weak or tired; yellowing of the eyes or skin  swelling of the ankles, feet, hands  unusually slow heartbeat Side effects that usually do not require medical attention (report to your doctor or health care professional if they continue or are bothersome):  diarrhea  hair loss  loss of appetite  muscle or joint pain  nausea, vomiting  pain, redness, or irritation at site where injected  tiredness This list may not describe all possible side effects. Call your doctor for medical  advice about side effects. You may report side effects to FDA at 1-800-FDA-1088. Where should I keep my medicine? This drug is given in a hospital or clinic and will not be stored at home. NOTE: This sheet is a summary. It may not cover all possible information. If you have questions about this medicine, talk to your doctor, pharmacist, or health care provider.  2021 Elsevier/Gold Standard (2019-08-23 13:37:23)  Pembrolizumab injection What is this medicine? PEMBROLIZUMAB (pem broe liz ue mab) is a monoclonal antibody. It is used to treat certain types of cancer. This medicine may be used for other purposes; ask your health care provider or pharmacist if you have questions. COMMON BRAND NAME(S): Keytruda What should I tell my health care provider before I take this medicine? They need to know if you have any of these conditions:  autoimmune diseases like Crohn's disease, ulcerative colitis, or lupus  have had or planning to have an allogeneic stem cell transplant (uses someone else's stem cells)  history of organ transplant  history of chest radiation  nervous system problems like myasthenia gravis or Guillain-Barre syndrome  an unusual or allergic reaction to pembrolizumab, other medicines, foods, dyes, or preservatives  pregnant or trying to get pregnant  breast-feeding How should I use this medicine? This medicine is for infusion into a vein. It is given by a health care professional in a hospital or clinic setting. A special MedGuide will be given to you before each treatment. Be sure to read this information carefully each time. Talk to your pediatrician regarding the use of this medicine in children. While this drug may be prescribed for children as young as 6 months for selected conditions, precautions do apply. Overdosage: If you think you have taken too much of this medicine contact a poison control center or emergency room at once. NOTE: This medicine is only for you. Do  not share this medicine with others. What if I miss a dose? It is important not to miss your dose. Call your doctor or health care professional if you are unable to keep an appointment. What may interact with this medicine? Interactions have not been studied. This list may not describe all possible interactions. Give your health care provider a list of all the medicines, herbs, non-prescription drugs, or dietary supplements you use. Also tell them if you smoke, drink alcohol, or use illegal drugs. Some items may interact with your medicine. What should I watch for while using this medicine? Your condition will be monitored carefully while you are receiving this medicine. You may need blood work done while you are taking this medicine. Do not become pregnant while taking this medicine or for 4 months after stopping it. Women should inform their doctor if they wish to become pregnant or think they might be pregnant. There is a potential for serious side effects to an unborn child. Talk to your health care professional or pharmacist for more information. Do not breast-feed an infant while taking this medicine or for 4 months after the last dose. What side effects may I notice from receiving this medicine? Side effects that you should report to your doctor or health care professional as soon as possible:  allergic reactions like skin rash, itching or hives, swelling of the face, lips, or tongue  bloody or black, tarry  breathing problems  changes in vision  chest pain  chills  confusion  constipation  cough  diarrhea  dizziness or feeling faint or lightheaded  fast or irregular heartbeat  fever  flushing  joint pain  low blood counts - this medicine may decrease the number of Paizlee Kinder blood cells, red blood cells and platelets. You may be at increased risk for infections and bleeding.  muscle pain  muscle weakness  pain, tingling, numbness in the hands or feet  persistent  headache  redness, blistering, peeling or loosening of the skin, including inside the mouth  signs and symptoms of high blood sugar such as dizziness; dry mouth; dry skin; fruity breath; nausea; stomach pain; increased hunger or thirst; increased urination  signs and symptoms of kidney injury like trouble passing urine or change in the amount of urine  signs and symptoms of liver injury like dark urine, light-colored stools, loss of appetite, nausea, right upper belly pain, yellowing of the eyes or skin  sweating  swollen lymph nodes  weight loss Side effects that usually do not require medical attention (report to your doctor or health care professional if they continue or are bothersome):  decreased appetite  hair loss  tiredness This list may not describe all possible side effects. Call your doctor  for medical advice about side effects. You may report side effects to FDA at 1-800-FDA-1088. Where should I keep my medicine? This drug is given in a hospital or clinic and will not be stored at home. NOTE: This sheet is a summary. It may not cover all possible information. If you have questions about this medicine, talk to your doctor, pharmacist, or health care provider.  2021 Elsevier/Gold Standard (2019-08-23 21:44:53)  Palonosetron Injection What is this medicine? PALONOSETRON (pal oh NOE se tron) is used to prevent nausea and vomiting caused by chemotherapy. It also helps prevent delayed nausea and vomiting that may occur a few days after your treatment. This medicine may be used for other purposes; ask your health care provider or pharmacist if you have questions. COMMON BRAND NAME(S): Aloxi What should I tell my health care provider before I take this medicine? They need to know if you have any of these conditions:  an unusual or allergic reaction to palonosetron, dolasetron, granisetron, ondansetron, other medicines, foods, dyes, or preservatives  pregnant or trying to  get pregnant  breast-feeding How should I use this medicine? This medicine is for infusion into a vein. It is given by a health care professional in a hospital or clinic setting. Talk to your pediatrician regarding the use of this medicine in children. While this drug may be prescribed for children as young as 1 month for selected conditions, precautions do apply. Overdosage: If you think you have taken too much of this medicine contact a poison control center or emergency room at once. NOTE: This medicine is only for you. Do not share this medicine with others. What if I miss a dose? This does not apply. What may interact with this medicine?  certain medicines for depression, anxiety, or psychotic disturbances  fentanyl  linezolid  MAOIs like Carbex, Eldepryl, Marplan, Nardil, and Parnate  methylene blue (injected into a vein)  tramadol This list may not describe all possible interactions. Give your health care provider a list of all the medicines, herbs, non-prescription drugs, or dietary supplements you use. Also tell them if you smoke, drink alcohol, or use illegal drugs. Some items may interact with your medicine. What should I watch for while using this medicine? Your condition will be monitored carefully while you are receiving this medicine. What side effects may I notice from receiving this medicine? Side effects that you should report to your doctor or health care professional as soon as possible:  allergic reactions like skin rash, itching or hives, swelling of the face, lips, or tongue  breathing problems  confusion  dizziness  fast, irregular heartbeat  fever and chills  loss of balance or coordination  seizures  sweating  swelling of the hands and feet  tremors  unusually weak or tired Side effects that usually do not require medical attention (report to your doctor or health care professional if they continue or are bothersome):  constipation or  diarrhea  headache This list may not describe all possible side effects. Call your doctor for medical advice about side effects. You may report side effects to FDA at 1-800-FDA-1088. Where should I keep my medicine? This drug is given in a hospital or clinic and will not be stored at home. NOTE: This sheet is a summary. It may not cover all possible information. If you have questions about this medicine, talk to your doctor, pharmacist, or health care provider.  2021 Elsevier/Gold Standard (2013-07-28 10:38:36)  Diphenhydramine injection What is this medicine? DIPHENHYDRAMINE (dye  fen HYE [employer]) is an antihistamine. It is used to treat the symptoms of an allergic reaction and motion sickness. It is also used to treat Parkinson's disease. This medicine may be used for other purposes; ask your health care provider or pharmacist if you have questions. COMMON BRAND NAME(S): Benadryl What should I tell my health care provider before I take this medicine? They need to know if you have any of these conditions:  asthma or lung disease  glaucoma  high blood pressure or heart disease  liver disease  pain or difficulty passing urine  prostate trouble  ulcers or other stomach problems  an unusual or allergic reaction to diphenhydramine, antihistamines, other medicines foods, dyes, or preservatives  pregnant or trying to get pregnant  breast-feeding How should I use this medicine? This medicine is for injection into a vein or a muscle. It is usually given by a health care professional in a hospital or clinic setting. If you get this medicine at home, you will be taught how to prepare and give this medicine. Use exactly as directed. Take your medicine at regular intervals. Do not take your medicine more often than directed. It is important that you put your used needles and syringes in a special sharps container. Do not put them in a trash can. If you do not have a sharps container, call  your pharmacist or healthcare provider to get one. Talk to your pediatrician regarding the use of this medicine in children. While this drug may be prescribed for selected conditions, precautions do apply. This medicine is not approved for use in newborns and premature babies. Patients over 41 years old may have a stronger reaction and need a smaller dose. Overdosage: If you think you have taken too much of this medicine contact a poison control center or emergency room at once. NOTE: This medicine is only for you. Do not share this medicine with others. What if I miss a dose? If you miss a dose, take it as soon as you can. If it is almost time for your next dose, take only that dose. Do not take double or extra doses. What may interact with this medicine? Do not take this medicine with any of the following medications:  MAOIs like Carbex, Eldepryl, Marplan, Nardil, and Parnate This medicine may also interact with the following medications:  alcohol  barbiturates, like phenobarbital  medicines for bladder spasm like oxybutynin, tolterodine  medicines for blood pressure  medicines for depression, anxiety, or psychotic disturbances  medicines for movement abnormalities or Parkinson's disease  medicines for sleep  other medicines for cold, cough or allergy  some medicines for the stomach like chlordiazepoxide, dicyclomine This list may not describe all possible interactions. Give your health care provider a list of all the medicines, herbs, non-prescription drugs, or dietary supplements you use. Also tell them if you smoke, drink alcohol, or use illegal drugs. Some items may interact with your medicine. What should I watch for while using this medicine? Your condition will be monitored carefully while you are receiving this medicine. Tell your doctor or healthcare professional if your symptoms do not start to get better or if they get worse. You may get drowsy or dizzy. Do not drive,  use machinery, or do anything that needs mental alertness until you know how this medicine affects you. Do not stand or sit up quickly, especially if you are an older patient. This reduces the risk of dizzy or fainting spells. Alcohol may interfere with  the effect of this medicine. Avoid alcoholic drinks. Your mouth may get dry. Chewing sugarless gum or sucking hard candy, and drinking plenty of water may help. Contact your doctor if the problem does not go away or is severe. What side effects may I notice from receiving this medicine? Side effects that you should report to your doctor or health care professional as soon as possible:  allergic reactions like skin rash, itching or hives, swelling of the face, lips, or tongue  breathing problems  changes in vision  chills  confused, agitated, nervous  irregular or fast heartbeat  low blood pressure  seizures  tremor  trouble passing urine  unusual bleeding or bruising  unusually weak or tired Side effects that usually do not require medical attention (report to your doctor or health care professional if they continue or are bothersome):  constipation, diarrhea  drowsy  headache  loss of appetite  stomach upset, vomiting  sweating  thick mucous This list may not describe all possible side effects. Call your doctor for medical advice about side effects. You may report side effects to FDA at 1-800-FDA-1088. Where should I keep my medicine? Keep out of the reach of children. If you are using this medicine at home, you will be instructed on how to store this medicine. Throw away any unused medicine after the expiration date on the label. NOTE: This sheet is a summary. It may not cover all possible information. If you have questions about this medicine, talk to your doctor, pharmacist, or health care provider.  2021 Elsevier/Gold Standard (2008-01-10 14:28:35)  Famotidine injection What is this medicine? FAMOTIDINE (fa  MOE ti deen) is a type of antihistamine that blocks the release of stomach acid. It is used to treat stomach or intestinal ulcers. It can relieve ulcer pain and discomfort, and the heartburn from acid reflux. This medicine may be used for other purposes; ask your health care provider or pharmacist if you have questions. COMMON BRAND NAME(S): Pepcid What should I tell my health care provider before I take this medicine? They need to know if you have any of these conditions:  kidney or liver disease  an unusual or allergic reaction to famotidine, other medicines, foods, dyes, or preservatives  pregnant or trying to get pregnant  breast-feeding How should I use this medicine? This medicine is for infusion into a vein. It is given by a health care professional in a hospital or clinic setting. Talk to your pediatrician regarding the use of this medicine in children. Special care may be needed. Overdosage: If you think you have taken too much of this medicine contact a poison control center or emergency room at once. NOTE: This medicine is only for you. Do not share this medicine with others. What if I miss a dose? This does not apply. What may interact with this medicine?  delavirdine  itraconazole  ketoconazole This list may not describe all possible interactions. Give your health care provider a list of all the medicines, herbs, non-prescription drugs, or dietary supplements you use. Also tell them if you smoke, drink alcohol, or use illegal drugs. Some items may interact with your medicine. What should I watch for while using this medicine? Tell your doctor or health care professional if your condition does not start to get better or gets worse. Do not take with aspirin, ibuprofen, or other antiinflammatory medicines. These can aggravate your condition. Do not smoke cigarettes or drink alcohol. These increase irritation in your stomach and can  increase the time it will take for ulcers to  heal. Cigarettes and alcohol can also worsen acid reflux or heartburn. If you get black, tarry stools or vomit up what looks like coffee grounds, call your doctor or health care professional at once. You may have a bleeding ulcer. This medicine may cause a decrease in vitamin B12. You should make sure that you get enough vitamin B12 while you are taking this medicine. Discuss the foods you eat and the vitamins you take with your health care professional. What side effects may I notice from receiving this medicine? Side effects that you should report to your doctor or health care professional as soon as possible:  allergic reactions like skin rash, itching or hives, swelling of the face, lips, or tongue  agitation, nervousness  confusion  hallucinations Side effects that usually do not require medical attention (report to your doctor or health care professional if they continue or are bothersome):  constipation  diarrhea  dizziness  headache This list may not describe all possible side effects. Call your doctor for medical advice about side effects. You may report side effects to FDA at 1-800-FDA-1088. Where should I keep my medicine? This medicine is given in a hospital or clinic. You will not be given this medicine to store at home. NOTE: This sheet is a summary. It may not cover all possible information. If you have questions about this medicine, talk to your doctor, pharmacist, or health care provider.  2021 Elsevier/Gold Standard (2017-05-07 13:16:46)  Dexamethasone injection What is this medicine? DEXAMETHASONE (dex a METH a sone) is a corticosteroid. It is used to treat inflammation of the skin, joints, lungs, and other organs. Common conditions treated include asthma, allergies, and arthritis. It is also used for other conditions, like blood disorders and diseases of the adrenal glands. This medicine may be used for other purposes; ask your health care provider or pharmacist if  you have questions. COMMON BRAND NAME(S): Decadron, DoubleDex, ReadySharp Dexamethasone, Simplist Dexamethasone, Solurex What should I tell my health care provider before I take this medicine? They need to know if you have any of these conditions:  Cushing's syndrome  diabetes  glaucoma  heart disease  high blood pressure  infection like herpes, measles, tuberculosis, or chickenpox  kidney disease  liver disease  mental illness  myasthenia gravis  osteoporosis  previous heart attack  seizures  stomach or intestine problems  thyroid disease  an unusual or allergic reaction to dexamethasone, corticosteroids, other medicines, lactose, foods, dyes, or preservatives  pregnant or trying to get pregnant  breast-feeding How should I use this medicine? This medicine is for injection into a muscle, joint, lesion, soft tissue, or vein. It is given by a health care professional in a hospital or clinic setting. Talk to your pediatrician regarding the use of this medicine in children. Special care may be needed. Overdosage: If you think you have taken too much of this medicine contact a poison control center or emergency room at once. NOTE: This medicine is only for you. Do not share this medicine with others. What if I miss a dose? This may not apply. If you are having a series of injections over a prolonged period, try not to miss an appointment. Call your doctor or health care professional to reschedule if you are unable to keep an appointment. What may interact with this medicine? Do not take this medicine with any of the following medications:  live virus vaccines This medicine may also interact  with the following medications:  aminoglutethimide  amphotericin B  aspirin and aspirin-like medicines  certain antibiotics like erythromycin, clarithromycin, and troleandomycin  certain antivirals for HIV or hepatitis  certain medicines for seizures like carbamazepine,  phenobarbital, phenytoin  certain medicines to treat myasthenia gravis  cholestyramine  cyclosporine  digoxin  diuretics  ephedrine  female hormones, like estrogen or progestins and birth control pills  insulin or other medicines for diabetes  isoniazid  ketoconazole  medicines that relax muscles for surgery  mifepristone  NSAIDs, medicines for pain and inflammation, like ibuprofen or naproxen  rifampin  skin tests for allergies  thalidomide  vaccines  warfarin This list may not describe all possible interactions. Give your health care provider a list of all the medicines, herbs, non-prescription drugs, or dietary supplements you use. Also tell them if you smoke, drink alcohol, or use illegal drugs. Some items may interact with your medicine. What should I watch for while using this medicine? Visit your health care professional for regular checks on your progress. Tell your health care professional if your symptoms do not start to get better or if they get worse. Your condition will be monitored carefully while you are receiving this medicine. Wear a medical ID bracelet or chain. Carry a card that describes your disease and details of your medicine and dosage times. This medicine may increase your risk of getting an infection. Call your health care professional for advice if you get a fever, chills, or sore throat, or other symptoms of a cold or flu. Do not treat yourself. Try to avoid being around people who are sick. Call your health care professional if you are around anyone with measles, chickenpox, or if you develop sores or blisters that do not heal properly. If you are going to need surgery or other procedures, tell your doctor or health care professional that you have taken this medicine within the last 12 months. Ask your doctor or health care professional about your diet. You may need to lower the amount of salt you eat. This medicine may increase blood sugar.  Ask your healthcare provider if changes in diet or medicines are needed if you have diabetes. What side effects may I notice from receiving this medicine? Side effects that you should report to your doctor or health care professional as soon as possible:  allergic reactions like skin rash, itching or hives, swelling of the face, lips, or tongue  bloody or black, tarry stools  changes in emotions or moods  changes in vision  confusion, excitement, restlessness  depressed mood  eye pain  hallucinations  muscle weakness  severe or sudden stomach or belly pain  signs and symptoms of high blood sugar such as being more thirsty or hungry or having to urinate more than normal. You may also feel very tired or have blurry vision.  signs and symptoms of infection like fever; chills; cough; sore throat; pain or trouble passing urine  swelling of ankles, feet  unusual bruising or bleeding  wounds that do not heal Side effects that usually do not require medical attention (report to your doctor or health care professional if they continue or are bothersome):  increased appetite  increased growth of face or body hair  headache  nausea, vomiting  pain, redness, or irritation at site where injected  skin problems, acne, thin and shiny skin  trouble sleeping  weight gain This list may not describe all possible side effects. Call your doctor for medical advice about side  effects. You may report side effects to FDA at 1-800-FDA-1088. Where should I keep my medicine? This medicine is given in a hospital or clinic and will not be stored at home. NOTE: This sheet is a summary. It may not cover all possible information. If you have questions about this medicine, talk to your doctor, pharmacist, or health care provider.  2021 Elsevier/Gold Standard (2019-04-04 13:51:58)

## 2021-02-20 ENCOUNTER — Inpatient Hospital Stay: Payer: 59

## 2021-02-20 ENCOUNTER — Telehealth: Payer: Self-pay | Admitting: *Deleted

## 2021-02-20 ENCOUNTER — Inpatient Hospital Stay (HOSPITAL_BASED_OUTPATIENT_CLINIC_OR_DEPARTMENT_OTHER): Payer: 59 | Admitting: Hospice and Palliative Medicine

## 2021-02-20 ENCOUNTER — Other Ambulatory Visit: Payer: Self-pay

## 2021-02-20 VITALS — BP 122/76 | HR 78 | Temp 98.6°F | Resp 16 | Wt 193.7 lb

## 2021-02-20 DIAGNOSIS — Z171 Estrogen receptor negative status [ER-]: Secondary | ICD-10-CM

## 2021-02-20 DIAGNOSIS — R531 Weakness: Secondary | ICD-10-CM

## 2021-02-20 DIAGNOSIS — C50412 Malignant neoplasm of upper-outer quadrant of left female breast: Secondary | ICD-10-CM | POA: Diagnosis not present

## 2021-02-20 DIAGNOSIS — Z5111 Encounter for antineoplastic chemotherapy: Secondary | ICD-10-CM | POA: Diagnosis not present

## 2021-02-20 LAB — COMPREHENSIVE METABOLIC PANEL
ALT: 149 U/L — ABNORMAL HIGH (ref 0–44)
AST: 116 U/L — ABNORMAL HIGH (ref 15–41)
Albumin: 4.8 g/dL (ref 3.5–5.0)
Alkaline Phosphatase: 51 U/L (ref 38–126)
Anion gap: 12 (ref 5–15)
BUN: 16 mg/dL (ref 6–20)
CO2: 25 mmol/L (ref 22–32)
Calcium: 9.7 mg/dL (ref 8.9–10.3)
Chloride: 99 mmol/L (ref 98–111)
Creatinine, Ser: 0.87 mg/dL (ref 0.44–1.00)
GFR, Estimated: >60 mL/min (ref >60)
Glucose, Bld: 108 mg/dL — ABNORMAL HIGH (ref 70–99)
Potassium: 3.3 mmol/L — ABNORMAL LOW (ref 3.5–5.1)
Sodium: 136 mmol/L (ref 135–145)
Total Bilirubin: 1.1 mg/dL (ref 0.3–1.2)
Total Protein: 7.8 g/dL (ref 6.5–8.1)

## 2021-02-20 LAB — CBC WITH DIFFERENTIAL/PLATELET
Abs Immature Granulocytes: 0.08 10*3/uL — ABNORMAL HIGH (ref 0.00–0.07)
Basophils Absolute: 0 10*3/uL (ref 0.0–0.1)
Basophils Relative: 0 %
Eosinophils Absolute: 0.1 10*3/uL (ref 0.0–0.5)
Eosinophils Relative: 1 %
HCT: 33.1 % — ABNORMAL LOW (ref 36.0–46.0)
Hemoglobin: 11.8 g/dL — ABNORMAL LOW (ref 12.0–15.0)
Immature Granulocytes: 1 %
Lymphocytes Relative: 30 %
Lymphs Abs: 2.2 10*3/uL (ref 0.7–4.0)
MCH: 33.2 pg (ref 26.0–34.0)
MCHC: 35.6 g/dL (ref 30.0–36.0)
MCV: 93.2 fL (ref 80.0–100.0)
Monocytes Absolute: 0.6 10*3/uL (ref 0.1–1.0)
Monocytes Relative: 8 %
Neutro Abs: 4.5 10*3/uL (ref 1.7–7.7)
Neutrophils Relative %: 60 %
Platelets: 302 10*3/uL (ref 150–400)
RBC: 3.55 MIL/uL — ABNORMAL LOW (ref 3.87–5.11)
RDW: 18.7 % — ABNORMAL HIGH (ref 11.5–15.5)
WBC: 7.4 10*3/uL (ref 4.0–10.5)
nRBC: 0.3 % — ABNORMAL HIGH (ref 0.0–0.2)

## 2021-02-20 MED ORDER — HEPARIN SOD (PORK) LOCK FLUSH 100 UNIT/ML IV SOLN
500.0000 [IU] | Freq: Once | INTRAVENOUS | Status: AC
  Administered 2021-02-20: 500 [IU] via INTRAVENOUS
  Filled 2021-02-20: qty 5

## 2021-02-20 MED ORDER — SODIUM CHLORIDE 0.9% FLUSH
10.0000 mL | INTRAVENOUS | Status: DC | PRN
  Administered 2021-02-20: 10 mL via INTRAVENOUS
  Filled 2021-02-20: qty 10

## 2021-02-20 MED ORDER — SODIUM CHLORIDE 0.9 % IV SOLN
Freq: Once | INTRAVENOUS | Status: AC
Start: 2021-02-20 — End: 2021-02-20
  Filled 2021-02-20: qty 250

## 2021-02-20 NOTE — Progress Notes (Signed)
[facility] [place]  Telephone:([phone] [phone]  Patient Care Team: Juluis Pitch, MD as PCP - General (Family Medicine) Sindy Guadeloupe, MD as Consulting Physician (Hematology and Oncology)   Name of the patient: Vickie Mathis  [id number]  1991/03/28   Date of visit: 02/20/21  Reason for Consult: Ms. Vickie Mathis is a 30 year old female with multiple medical problems including stage IIIb 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 breast cancer on systemic treatment with Taxol and Keytruda.  Patient was last seen by Dr. Janese Mathis on 02/18/2021.  At that time, she had fairly significant weight gain with suspected fluid retention.  She was started on trial of furosemide 20 mg daily.  She presents to [facility] on 02/20/2021 with complaints of weakness and feeling dizzy when she stands.  Patient reports that she was climbing out of her car yesterday and carrying groceries and felt flushed, hot, and woozy.  She denies LOC.  She had some tinnitus during this event. She denies visual changes.  Denies recent fevers or illnesses. Denies any easy bleeding or bruising. Reports good appetite.. Denies chest pain. Denies any nausea, vomiting, constipation, or diarrhea. Denies urinary complaints. Patient offers no further specific complaints today.  PAST MEDICAL HISTORY: Past Medical History:  Diagnosis Date  . Anxiety   . Asthma   . Breast cancer ([place])   . Depression   . Family history of cancer   . Varicose veins of bilateral lower extremities with pain     PAST SURGICAL HISTORY:  Past Surgical History:  Procedure Laterality Date  . APPENDECTOMY  2018  . BREAST BIOPSY Left 11/26/2020   vision 12:00 6cmfn path pending  . BREAST BIOPSY Left 11/26/2020   LN t3 marler path pending  . PORTACATH PLACEMENT Right 12/13/2020   Procedure: INSERTION PORT-A-CATH;  Surgeon: Herbert Pun, MD;  Location: [facility] ORS;  Service: General;  Laterality: Right;     HEMATOLOGY/ONCOLOGY HISTORY:  Oncology History  Invasive carcinoma of breast ([place])  12/03/2020 Initial Diagnosis   Invasive carcinoma of breast ([place])   12/17/2020 -  Chemotherapy    Patient is on Treatment Plan: BREAST PEMBROLIZUMAB + CARBOPLATIN D1,8,15+ PACLITAXEL D1,8,15 Q21D X 4 CYCLES / PEMBROLIZUMAB + AC Q21D X 4 CYCLES      12/17/2020 Cancer Staging   Staging form: Breast, AJCC 8th Edition - Clinical stage from 12/17/2020: Stage IIIB (cT2, cN1, cM0, G3, ER-, PR-, HER2-) - Signed by Sindy Guadeloupe, MD on 12/19/2020 Histologic grading system: 3 grade system    Genetic Testing   Negative genetic testing. No pathogenic variants identified on the [employer] CancerNext-Expanded+RNA Panel. 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 The report date is 01/14/2021.  The CancerNext-Expanded + RNAinsight gene panel offered by [employer] and includes sequencing and rearrangement analysis for the following 77 genes: IP, ALK, APC*, ATM*, AXIN2, BAP1, BARD1, BLM, BMPR1A, BRCA1*, BRCA2*, BRIP1*, CDC73, CDH1*,CDK4, CDKN1B, CDKN2A, CHEK2*, CTNNA1, DICER1, FANCC, FH, FLCN, GALNT12, KIF1B, LZTR1, MAX, MEN1, MET, MLH1*, MSH2*, MSH3, MSH6*, MUTYH*, NBN, NF1*, NF2, NTHL1, PALB2*, PHOX2B, PMS2*, POT1, PRKAR1A, PTCH1, PTEN*, RAD51C*, RAD51D*,RB1, RECQL, RET, SDHA, SDHAF2, SDHB, SDHC, SDHD, SMAD4, SMARCA4, SMARCB1, SMARCE1, STK11, SUFU, TMEM127, TP53*,TSC1, TSC2, VHL and XRCC2 (sequencing and deletion/duplication); EGFR, EGLN1, HOXB13, KIT, MITF, PDGFRA, POLD1 and POLE (sequencing only); EPCAM and GREM1 (deletion/duplication only).     ALLERGIES:  is allergic to carboplatin, amoxicillin, and sulfa antibiotics.  MEDICATIONS:  Current Outpatient Medications  Medication Sig Dispense Refill  . acetaminophen (TYLENOL) 325 MG tablet     .  albuterol (VENTOLIN HFA) 108 (90 Base) MCG/ACT inhaler Inhale 1-2 puffs into the lungs every 6 (six) hours as needed for shortness of breath or wheezing.    . Ascorbic Acid (SUPER  C COMPLEX PO) Take 1 capsule by mouth daily.    . busPIRone (BUSPAR) 15 MG tablet Take 15 mg by mouth 2 (two) times daily.    . citalopram (CELEXA) 40 MG tablet Take 40 mg by mouth daily.    . clonazePAM (KLONOPIN) 0.5 MG tablet Take 1 tablet (0.5 mg total) by mouth 3 (three) times daily as needed. 60 tablet 0  . dexamethasone (DECADRON) 4 MG tablet Take 2 tablets (8 mg total) by mouth daily. Start the day after chemotherapy for 2 days. 30 tablet 1  . furosemide (LASIX) 20 MG tablet Take 1 tablet (20 mg total) by mouth daily. 5 tablet 0  . ibuprofen (ADVIL) 200 MG tablet     . lidocaine-prilocaine (EMLA) cream Apply to affected area once 30 g 3  . LORazepam (ATIVAN) 0.5 MG tablet TAKE 1 TABLET (0.5 MG TOTAL) BY MOUTH EVERY 6 (SIX) HOURS AS NEEDED (NAUSEA OR VOMITING). 30 tablet 0  . norethindrone-ethinyl estradiol (LOESTRIN FE) 1-20 MG-MCG tablet Take 1 tablet by mouth daily. (Patient not taking: No sig reported)    . ondansetron (ZOFRAN) 8 MG tablet Take 1 tablet (8 mg total) by mouth 2 (two) times daily as needed for refractory nausea / vomiting. Start on day 3 after chemo. (Patient not taking: No sig reported) 30 tablet 1  . prochlorperazine (COMPAZINE) 10 MG tablet Take 1 tablet (10 mg total) by mouth every 6 (six) hours as needed (Nausea or vomiting). (Patient not taking: No sig reported) 30 tablet 1  . triamcinolone ointment (KENALOG) 0.5 % Apply 1 application topically 2 (two) times daily. (Patient not taking: Reported on 02/18/2021) 30 g 0   No current facility-administered medications for this visit.    VITAL SIGNS: There were no vitals taken for this visit. There were no vitals filed for this visit.  Estimated body mass index is 30.29 kg/m as calculated from the following:   Height as of 12/17/20: $RemoveBef'5\' 9"'ObCSisLlIF$  (1.753 m).   Weight as of 02/18/21: 205 lb 1.6 oz (93 kg).  LABS: CBC:    Component Value Date/Time   WBC 5.4 02/18/2021 0957   HGB 10.8 (L) 02/18/2021 0957   HCT 31.2 (L)  02/18/2021 0957   PLT 253 02/18/2021 0957   MCV 94.8 02/18/2021 0957   NEUTROABS 2.5 02/18/2021 0957   LYMPHSABS 2.0 02/18/2021 0957   MONOABS 0.5 02/18/2021 0957   EOSABS 0.2 02/18/2021 0957   BASOSABS 0.1 02/18/2021 0957   Comprehensive Metabolic Panel:    Component Value Date/Time   NA 137 02/18/2021 0957   K 3.8 02/18/2021 0957   CL 102 02/18/2021 0957   CO2 24 02/18/2021 0957   BUN 12 02/18/2021 0957   CREATININE 0.69 02/18/2021 0957   GLUCOSE 93 02/18/2021 0957   CALCIUM 8.8 (L) 02/18/2021 0957   AST 22 02/18/2021 0957   ALT 32 02/18/2021 0957   ALKPHOS 65 02/18/2021 0957   BILITOT 0.3 02/18/2021 0957   PROT 6.3 (L) 02/18/2021 0957   ALBUMIN 3.7 02/18/2021 0957    RADIOGRAPHIC STUDIES: No results found.  PERFORMANCE STATUS (ECOG) : 1 - Symptomatic but completely ambulatory  Review of Systems Unless otherwise noted, a complete review of systems is negative.  Physical Exam General: NAD Cardiovascular: regular rate and rhythm Pulmonary: clear ant fields  Abdomen: soft, nontender, + bowel sounds GU: no suprapubic tenderness Extremities: no edema, no joint deformities Skin: no rashes Neurological: Weakness, CNs grossly intact, no cerebellar dysfunction, no nystagmus  Assessment and Plan- Patient is a 30 y.o. female with stage III breast cancer on chemo/immunotherapy who presents to the clinic for evaluation of weakness and positional dizziness   Dizziness -suspect that event reflects orthostasis likely exacerbated by working outside in the heat.  Today, patient feels much improved.  She does not appear clinically dehydrated by labs and is without orthostasis in the clinic today.  Her weight is back to baseline.  We will give her 1 L normal saline and hold furosemide.  Patient is pending echo.  Patient to monitor her weight.  Recommended caution with working outside (she works for [employer]).  Will monitor closely for changes.  Hypokalemia -mild and likely secondary to  furosemide.  Hold Lasix and increase foods rich in potassium.  Transaminitis -mild and likely secondary to chemo.  Will monitor.  Case and plan discussed with Dr. Janese Mathis   Patient expressed understanding and was in agreement with this plan. She also understands that She can call clinic at any time with any questions, concerns, or complaints.   Thank you for allowing me to participate in the care of this very pleasant patient.   Time Total: 30 minutes  Visit consisted of counseling and education dealing with the complex and emotionally intense issues of symptom management and palliative care in the setting of serious and potentially life-threatening illness.Greater than 50%  of this time was spent counseling and coordinating care related to the above assessment and plan.  Signed by: Altha Harm, PhD, NP-C

## 2021-02-20 NOTE — Telephone Encounter (Signed)
Patient called reporting that she is having dizziness felling as her b/p is dropping when she gets up and has fallen a few times.States she has feeling of confusion and unable to think.clearly. Please advise

## 2021-02-20 NOTE — Telephone Encounter (Signed)
She will need to be seen in [facility]. Ask her to hold lasix

## 2021-02-20 NOTE — Telephone Encounter (Signed)
Labs, smc, possible fluids today please

## 2021-02-20 NOTE — Telephone Encounter (Signed)
Pt will be here @ 1:15 for labs, [facility] and possible fluids

## 2021-02-20 NOTE — Progress Notes (Signed)
Pt reports feeling dizziness to the point she fell once yesterday. States when stands feels very flushed and hot and feels like going to pass out. Pt also reports "brain fog".

## 2021-02-23 ENCOUNTER — Other Ambulatory Visit: Payer: Self-pay | Admitting: Oncology

## 2021-02-23 DIAGNOSIS — Z171 Estrogen receptor negative status [ER-]: Secondary | ICD-10-CM

## 2021-02-23 DIAGNOSIS — C50412 Malignant neoplasm of upper-outer quadrant of left female breast: Secondary | ICD-10-CM

## 2021-02-25 ENCOUNTER — Encounter: Payer: Self-pay | Admitting: *Deleted

## 2021-02-25 ENCOUNTER — Inpatient Hospital Stay: Payer: 59

## 2021-02-25 ENCOUNTER — Other Ambulatory Visit: Payer: Self-pay | Admitting: *Deleted

## 2021-02-25 ENCOUNTER — Encounter: Payer: Self-pay | Admitting: Internal Medicine

## 2021-02-25 ENCOUNTER — Encounter: Payer: Self-pay | Admitting: Oncology

## 2021-02-25 VITALS — BP 119/75 | HR 71 | Temp 97.0°F | Resp 18 | Wt 197.8 lb

## 2021-02-25 DIAGNOSIS — Z171 Estrogen receptor negative status [ER-]: Secondary | ICD-10-CM

## 2021-02-25 DIAGNOSIS — Z5111 Encounter for antineoplastic chemotherapy: Secondary | ICD-10-CM | POA: Diagnosis not present

## 2021-02-25 DIAGNOSIS — C50919 Malignant neoplasm of unspecified site of unspecified female breast: Secondary | ICD-10-CM

## 2021-02-25 DIAGNOSIS — C50412 Malignant neoplasm of upper-outer quadrant of left female breast: Secondary | ICD-10-CM

## 2021-02-25 LAB — CBC WITH DIFFERENTIAL/PLATELET
Abs Immature Granulocytes: 0.03 10*3/uL (ref 0.00–0.07)
Basophils Absolute: 0.1 10*3/uL (ref 0.0–0.1)
Basophils Relative: 1 %
Eosinophils Absolute: 0.2 10*3/uL (ref 0.0–0.5)
Eosinophils Relative: 4 %
HCT: 28.6 % — ABNORMAL LOW (ref 36.0–46.0)
Hemoglobin: 10.1 g/dL — ABNORMAL LOW (ref 12.0–15.0)
Immature Granulocytes: 1 %
Lymphocytes Relative: 46 %
Lymphs Abs: 2.6 10*3/uL (ref 0.7–4.0)
MCH: 33.4 pg (ref 26.0–34.0)
MCHC: 35.3 g/dL (ref 30.0–36.0)
MCV: 94.7 fL (ref 80.0–100.0)
Monocytes Absolute: 0.3 10*3/uL (ref 0.1–1.0)
Monocytes Relative: 5 %
Neutro Abs: 2.4 10*3/uL (ref 1.7–7.7)
Neutrophils Relative %: 43 %
Platelets: 214 10*3/uL (ref 150–400)
RBC: 3.02 MIL/uL — ABNORMAL LOW (ref 3.87–5.11)
RDW: 18.3 % — ABNORMAL HIGH (ref 11.5–15.5)
WBC: 5.6 10*3/uL (ref 4.0–10.5)
nRBC: 0 % (ref 0.0–0.2)

## 2021-02-25 LAB — COMPREHENSIVE METABOLIC PANEL WITH GFR
ALT: 50 U/L — ABNORMAL HIGH (ref 0–44)
AST: 25 U/L (ref 15–41)
Albumin: 3.8 g/dL (ref 3.5–5.0)
Alkaline Phosphatase: 57 U/L (ref 38–126)
Anion gap: 10 (ref 5–15)
BUN: 13 mg/dL (ref 6–20)
CO2: 24 mmol/L (ref 22–32)
Calcium: 8.9 mg/dL (ref 8.9–10.3)
Chloride: 104 mmol/L (ref 98–111)
Creatinine, Ser: 0.73 mg/dL (ref 0.44–1.00)
GFR, Estimated: >60 mL/min
Glucose, Bld: 94 mg/dL (ref 70–99)
Potassium: 4 mmol/L (ref 3.5–5.1)
Sodium: 138 mmol/L (ref 135–145)
Total Bilirubin: 0.4 mg/dL (ref 0.3–1.2)
Total Protein: 6.3 g/dL — ABNORMAL LOW (ref 6.5–8.1)

## 2021-02-25 LAB — IRON AND TIBC
Iron: 73 ug/dL (ref 28–170)
Saturation Ratios: 20 % (ref 10.4–31.8)
TIBC: 372 ug/dL (ref 250–450)
UIBC: 299 ug/dL

## 2021-02-25 LAB — FOLATE: Folate: 5.4 ng/mL — ABNORMAL LOW

## 2021-02-25 LAB — FERRITIN: Ferritin: 161 ng/mL (ref 11–307)

## 2021-02-25 LAB — VITAMIN B12: Vitamin B-12: 177 pg/mL — ABNORMAL LOW (ref 180–914)

## 2021-02-25 MED ORDER — DIPHENHYDRAMINE HCL 50 MG/ML IJ SOLN
50.0000 mg | Freq: Once | INTRAMUSCULAR | Status: AC
  Administered 2021-02-25: 50 mg via INTRAVENOUS
  Filled 2021-02-25: qty 1

## 2021-02-25 MED ORDER — FOLIC ACID 1 MG PO TABS: 2.0000 mg | ORAL_TABLET | Freq: Every day | ORAL | 2 refills | Status: DC

## 2021-02-25 MED ORDER — FAMOTIDINE 20 MG IN NS 100 ML IVPB
20.0000 mg | Freq: Once | INTRAVENOUS | Status: AC
  Administered 2021-02-25: 20 mg via INTRAVENOUS
  Filled 2021-02-25: qty 20

## 2021-02-25 MED ORDER — HEPARIN SOD (PORK) LOCK FLUSH 100 UNIT/ML IV SOLN
500.0000 [IU] | Freq: Once | INTRAVENOUS | Status: AC | PRN
  Administered 2021-02-25: 500 [IU]
  Filled 2021-02-25: qty 5

## 2021-02-25 MED ORDER — SODIUM CHLORIDE 0.9 % IV SOLN
65.0000 mg/m2 | Freq: Once | INTRAVENOUS | Status: AC
  Administered 2021-02-25: 132 mg via INTRAVENOUS
  Filled 2021-02-25: qty 22

## 2021-02-25 MED ORDER — SODIUM CHLORIDE 0.9 % IV SOLN
10.0000 mg | Freq: Once | INTRAVENOUS | Status: AC
  Administered 2021-02-25: 10 mg via INTRAVENOUS
  Filled 2021-02-25: qty 10

## 2021-02-25 MED ORDER — PALONOSETRON HCL INJECTION 0.25 MG/5ML
0.2500 mg | Freq: Once | INTRAVENOUS | Status: AC
  Administered 2021-02-25: 0.25 mg via INTRAVENOUS
  Filled 2021-02-25: qty 5

## 2021-02-25 MED ORDER — LEUPROLIDE ACETATE 3.75 MG IM KIT: 3.7500 mg | PACK | Freq: Once | INTRAMUSCULAR | Status: DC

## 2021-02-25 MED ORDER — HEPARIN SOD (PORK) LOCK FLUSH 100 UNIT/ML IV SOLN
INTRAVENOUS | Status: AC
  Filled 2021-02-25: qty 5

## 2021-02-25 MED ORDER — SODIUM CHLORIDE 0.9 % IV SOLN
Freq: Once | INTRAVENOUS | Status: AC
  Filled 2021-02-25: qty 250

## 2021-02-25 NOTE — Progress Notes (Signed)
Pt received taxol infusion in clinic today. Tolerated well. 

## 2021-02-25 NOTE — Patient Instructions (Signed)
[facility]    Discharge Instructions: Thank you for choosing [facility] to provide your oncology and hematology care.  If you have a lab appointment with the [facility], please go directly to the [facility] and check in at the registration area.  Wear comfortable clothing and clothing appropriate for easy access to any Portacath or PICC line.   We strive to give you quality time with your provider. You may need to reschedule your appointment if you arrive late (15 or more minutes).  Arriving late affects you and other patients whose appointments are after yours.  Also, if you miss three or more appointments without notifying the office, you may be dismissed from the clinic at the provider's discretion.      For prescription refill requests, have your pharmacy contact our office and allow 72 hours for refills to be completed.    Today you received the following chemotherapy and/or immunotherapy agents - taxol   To help prevent nausea and vomiting after your treatment, we encourage you to take your nausea medication as directed.  BELOW ARE SYMPTOMS THAT SHOULD BE REPORTED IMMEDIATELY: . *FEVER GREATER THAN 100.4 F (38 C) OR HIGHER . *CHILLS OR SWEATING . *NAUSEA AND VOMITING THAT IS NOT CONTROLLED WITH YOUR NAUSEA MEDICATION . *UNUSUAL SHORTNESS OF BREATH . *UNUSUAL BRUISING OR BLEEDING . *URINARY PROBLEMS (pain or burning when urinating, or frequent urination) . *BOWEL PROBLEMS (unusual diarrhea, constipation, pain near the anus) . TENDERNESS IN MOUTH AND THROAT WITH OR WITHOUT PRESENCE OF ULCERS (sore throat, sores in mouth, or a toothache) . UNUSUAL RASH, SWELLING OR PAIN  . UNUSUAL VAGINAL DISCHARGE OR ITCHING   Items with * indicate a potential emergency and should be followed up as soon as possible or go to the Emergency Department if any problems should occur.  Please show the CHEMOTHERAPY ALERT CARD or IMMUNOTHERAPY ALERT  CARD at check-in to the Emergency Department and triage nurse.  Should you have questions after your visit or need to cancel or reschedule your appointment, please contact [facility]  [phone] and follow the prompts.  Office hours are 8:00 a.m. to 4:30 p.m. Monday - Friday. Please note that voicemails left after 4:00 p.m. may not be returned until the following business day.  We are closed weekends and major holidays. You have access to a nurse at all times for urgent questions. Please call the main number to the clinic [phone] and follow the prompts.  For any non-urgent questions, you may also contact your provider using MyChart. We now offer e-Visits for anyone 36 and older to request care online for non-urgent symptoms. For details visit [email or web address].   Also download the MyChart app! Go to the app store, search "MyChart", open the app, select [facility], and log in with your MyChart username and password.  Due to Covid, a mask is required upon entering the hospital/clinic. If you do not have a mask, one will be given to you upon arrival. For doctor visits, patients may have 1 support person aged 77 or older with them. For treatment visits, patients cannot have anyone with them due to current Covid guidelines and our immunocompromised population.   Diphenhydramine injection What is this medicine? DIPHENHYDRAMINE (dye fen HYE dra meen) is an antihistamine. It is used to treat the symptoms of an allergic reaction and motion sickness. It is also used to treat Parkinson's disease. This medicine may be used for other purposes;  ask your health care provider or pharmacist if you have questions. COMMON BRAND NAME(S): Benadryl What should I tell my health care provider before I take this medicine? They need to know if you have any of these conditions:  asthma or lung disease  glaucoma  high blood pressure or heart disease  liver  disease  pain or difficulty passing urine  prostate trouble  ulcers or other stomach problems  an unusual or allergic reaction to diphenhydramine, antihistamines, other medicines foods, dyes, or preservatives  pregnant or trying to get pregnant  breast-feeding How should I use this medicine? This medicine is for injection into a vein or a muscle. It is usually given by a health care professional in a hospital or clinic setting. If you get this medicine at home, you will be taught how to prepare and give this medicine. Use exactly as directed. Take your medicine at regular intervals. Do not take your medicine more often than directed. It is important that you put your used needles and syringes in a special sharps container. Do not put them in a trash can. If you do not have a sharps container, call your pharmacist or healthcare provider to get one. Talk to your pediatrician regarding the use of this medicine in children. While this drug may be prescribed for selected conditions, precautions do apply. This medicine is not approved for use in newborns and premature babies. Patients over 43 years old may have a stronger reaction and need a smaller dose. Overdosage: If you think you have taken too much of this medicine contact a poison control center or emergency room at once. NOTE: This medicine is only for you. Do not share this medicine with others. What if I miss a dose? If you miss a dose, take it as soon as you can. If it is almost time for your next dose, take only that dose. Do not take double or extra doses. What may interact with this medicine? Do not take this medicine with any of the following medications:  MAOIs like Carbex, Eldepryl, Marplan, Nardil, and Parnate This medicine may also interact with the following medications:  alcohol  barbiturates, like phenobarbital  medicines for bladder spasm like oxybutynin, tolterodine  medicines for blood pressure  medicines for  depression, anxiety, or psychotic disturbances  medicines for movement abnormalities or Parkinson's disease  medicines for sleep  other medicines for cold, cough or allergy  some medicines for the stomach like chlordiazepoxide, dicyclomine This list may not describe all possible interactions. Give your health care provider a list of all the medicines, herbs, non-prescription drugs, or dietary supplements you use. Also tell them if you smoke, drink alcohol, or use illegal drugs. Some items may interact with your medicine. What should I watch for while using this medicine? Your condition will be monitored carefully while you are receiving this medicine. Tell your doctor or healthcare professional if your symptoms do not start to get better or if they get worse. You may get drowsy or dizzy. Do not drive, use machinery, or do anything that needs mental alertness until you know how this medicine affects you. Do not stand or sit up quickly, especially if you are an older patient. This reduces the risk of dizzy or fainting spells. Alcohol may interfere with the effect of this medicine. Avoid alcoholic drinks. Your mouth may get dry. Chewing sugarless gum or sucking hard candy, and drinking plenty of water may help. Contact your doctor if the problem does not go away  or is severe. What side effects may I notice from receiving this medicine? Side effects that you should report to your doctor or health care professional as soon as possible:  allergic reactions like skin rash, itching or hives, swelling of the face, lips, or tongue  breathing problems  changes in vision  chills  confused, agitated, nervous  irregular or fast heartbeat  low blood pressure  seizures  tremor  trouble passing urine  unusual bleeding or bruising  unusually weak or tired Side effects that usually do not require medical attention (report to your doctor or health care professional if they continue or are  bothersome):  constipation, diarrhea  drowsy  headache  loss of appetite  stomach upset, vomiting  sweating  thick mucous This list may not describe all possible side effects. Call your doctor for medical advice about side effects. You may report side effects to FDA at 1-800-FDA-1088. Where should I keep my medicine? Keep out of the reach of children. If you are using this medicine at home, you will be instructed on how to store this medicine. Throw away any unused medicine after the expiration date on the label. NOTE: This sheet is a summary. It may not cover all possible information. If you have questions about this medicine, talk to your doctor, pharmacist, or health care provider.  2021 Elsevier/Gold Standard (2008-01-10 14:28:35)  Palonosetron Injection What is this medicine? PALONOSETRON (pal oh NOE se tron) is used to prevent nausea and vomiting caused by chemotherapy. It also helps prevent delayed nausea and vomiting that may occur a few days after your treatment. This medicine may be used for other purposes; ask your health care provider or pharmacist if you have questions. COMMON BRAND NAME(S): Aloxi What should I tell my health care provider before I take this medicine? They need to know if you have any of these conditions:  an unusual or allergic reaction to palonosetron, dolasetron, granisetron, ondansetron, other medicines, foods, dyes, or preservatives  pregnant or trying to get pregnant  breast-feeding How should I use this medicine? This medicine is for infusion into a vein. It is given by a health care professional in a hospital or clinic setting. Talk to your pediatrician regarding the use of this medicine in children. While this drug may be prescribed for children as young as 1 month for selected conditions, precautions do apply. Overdosage: If you think you have taken too much of this medicine contact a poison control center or emergency room at  once. NOTE: This medicine is only for you. Do not share this medicine with others. What if I miss a dose? This does not apply. What may interact with this medicine?  certain medicines for depression, anxiety, or psychotic disturbances  fentanyl  linezolid  MAOIs like Carbex, Eldepryl, Marplan, Nardil, and Parnate  methylene blue (injected into a vein)  tramadol This list may not describe all possible interactions. Give your health care provider a list of all the medicines, herbs, non-prescription drugs, or dietary supplements you use. Also tell them if you smoke, drink alcohol, or use illegal drugs. Some items may interact with your medicine. What should I watch for while using this medicine? Your condition will be monitored carefully while you are receiving this medicine. What side effects may I notice from receiving this medicine? Side effects that you should report to your doctor or health care professional as soon as possible:  allergic reactions like skin rash, itching or hives, swelling of the face, lips, or  tongue  breathing problems  confusion  dizziness  fast, irregular heartbeat  fever and chills  loss of balance or coordination  seizures  sweating  swelling of the hands and feet  tremors  unusually weak or tired Side effects that usually do not require medical attention (report to your doctor or health care professional if they continue or are bothersome):  constipation or diarrhea  headache This list may not describe all possible side effects. Call your doctor for medical advice about side effects. You may report side effects to FDA at 1-800-FDA-1088. Where should I keep my medicine? This drug is given in a hospital or clinic and will not be stored at home. NOTE: This sheet is a summary. It may not cover all possible information. If you have questions about this medicine, talk to your doctor, pharmacist, or health care provider.  2021 Elsevier/Gold  Standard (2013-07-28 10:38:36)  Dexamethasone injection What is this medicine? DEXAMETHASONE (dex a METH a sone) is a corticosteroid. It is used to treat inflammation of the skin, joints, lungs, and other organs. Common conditions treated include asthma, allergies, and arthritis. It is also used for other conditions, like blood disorders and diseases of the adrenal glands. This medicine may be used for other purposes; ask your health care provider or pharmacist if you have questions. COMMON BRAND NAME(S): Decadron, DoubleDex, ReadySharp Dexamethasone, Simplist Dexamethasone, Solurex What should I tell my health care provider before I take this medicine? They need to know if you have any of these conditions:  Cushing's syndrome  diabetes  glaucoma  heart disease  high blood pressure  infection like herpes, measles, tuberculosis, or chickenpox  kidney disease  liver disease  mental illness  myasthenia gravis  osteoporosis  previous heart attack  seizures  stomach or intestine problems  thyroid disease  an unusual or allergic reaction to dexamethasone, corticosteroids, other medicines, lactose, foods, dyes, or preservatives  pregnant or trying to get pregnant  breast-feeding How should I use this medicine? This medicine is for injection into a muscle, joint, lesion, soft tissue, or vein. It is given by a health care professional in a hospital or clinic setting. Talk to your pediatrician regarding the use of this medicine in children. Special care may be needed. Overdosage: If you think you have taken too much of this medicine contact a poison control center or emergency room at once. NOTE: This medicine is only for you. Do not share this medicine with others. What if I miss a dose? This may not apply. If you are having a series of injections over a prolonged period, try not to miss an appointment. Call your doctor or health care professional to reschedule if you are  unable to keep an appointment. What may interact with this medicine? Do not take this medicine with any of the following medications:  live virus vaccines This medicine may also interact with the following medications:  aminoglutethimide  amphotericin B  aspirin and aspirin-like medicines  certain antibiotics like erythromycin, clarithromycin, and troleandomycin  certain antivirals for HIV or hepatitis  certain medicines for seizures like carbamazepine, phenobarbital, phenytoin  certain medicines to treat myasthenia gravis  cholestyramine  cyclosporine  digoxin  diuretics  ephedrine  female hormones, like estrogen or progestins and birth control pills  insulin or other medicines for diabetes  isoniazid  ketoconazole  medicines that relax muscles for surgery  mifepristone  NSAIDs, medicines for pain and inflammation, like ibuprofen or naproxen  rifampin  skin tests for allergies  thalidomide  vaccines  warfarin This list may not describe all possible interactions. Give your health care provider a list of all the medicines, herbs, non-prescription drugs, or dietary supplements you use. Also tell them if you smoke, drink alcohol, or use illegal drugs. Some items may interact with your medicine. What should I watch for while using this medicine? Visit your health care professional for regular checks on your progress. Tell your health care professional if your symptoms do not start to get better or if they get worse. Your condition will be monitored carefully while you are receiving this medicine. Wear a medical ID bracelet or chain. Carry a card that describes your disease and details of your medicine and dosage times. This medicine may increase your risk of getting an infection. Call your health care professional for advice if you get a fever, chills, or sore throat, or other symptoms of a cold or flu. Do not treat yourself. Try to avoid being around people who  are sick. Call your health care professional if you are around anyone with measles, chickenpox, or if you develop sores or blisters that do not heal properly. If you are going to need surgery or other procedures, tell your doctor or health care professional that you have taken this medicine within the last 12 months. Ask your doctor or health care professional about your diet. You may need to lower the amount of salt you eat. This medicine may increase blood sugar. Ask your healthcare provider if changes in diet or medicines are needed if you have diabetes. What side effects may I notice from receiving this medicine? Side effects that you should report to your doctor or health care professional as soon as possible:  allergic reactions like skin rash, itching or hives, swelling of the face, lips, or tongue  bloody or black, tarry stools  changes in emotions or moods  changes in vision  confusion, excitement, restlessness  depressed mood  eye pain  hallucinations  muscle weakness  severe or sudden stomach or belly pain  signs and symptoms of high blood sugar such as being more thirsty or hungry or having to urinate more than normal. You may also feel very tired or have blurry vision.  signs and symptoms of infection like fever; chills; cough; sore throat; pain or trouble passing urine  swelling of ankles, feet  unusual bruising or bleeding  wounds that do not heal Side effects that usually do not require medical attention (report to your doctor or health care professional if they continue or are bothersome):  increased appetite  increased growth of face or body hair  headache  nausea, vomiting  pain, redness, or irritation at site where injected  skin problems, acne, thin and shiny skin  trouble sleeping  weight gain This list may not describe all possible side effects. Call your doctor for medical advice about side effects. You may report side effects to FDA at  1-800-FDA-1088. Where should I keep my medicine? This medicine is given in a hospital or clinic and will not be stored at home. NOTE: This sheet is a summary. It may not cover all possible information. If you have questions about this medicine, talk to your doctor, pharmacist, or health care provider.  2021 Elsevier/Gold Standard (2019-04-04 13:51:58)  Famotidine injection What is this medicine? FAMOTIDINE (fa MOE ti deen) is a type of antihistamine that blocks the release of stomach acid. It is used to treat stomach or intestinal ulcers. It can relieve ulcer pain  and discomfort, and the heartburn from acid reflux. This medicine may be used for other purposes; ask your health care provider or pharmacist if you have questions. COMMON BRAND NAME(S): Pepcid What should I tell my health care provider before I take this medicine? They need to know if you have any of these conditions:  kidney or liver disease  an unusual or allergic reaction to famotidine, other medicines, foods, dyes, or preservatives  pregnant or trying to get pregnant  breast-feeding How should I use this medicine? This medicine is for infusion into a vein. It is given by a health care professional in a hospital or clinic setting. Talk to your pediatrician regarding the use of this medicine in children. Special care may be needed. Overdosage: If you think you have taken too much of this medicine contact a poison control center or emergency room at once. NOTE: This medicine is only for you. Do not share this medicine with others. What if I miss a dose? This does not apply. What may interact with this medicine?  delavirdine  itraconazole  ketoconazole This list may not describe all possible interactions. Give your health care provider a list of all the medicines, herbs, non-prescription drugs, or dietary supplements you use. Also tell them if you smoke, drink alcohol, or use illegal drugs. Some items may interact with  your medicine. What should I watch for while using this medicine? Tell your doctor or health care professional if your condition does not start to get better or gets worse. Do not take with aspirin, ibuprofen, or other antiinflammatory medicines. These can aggravate your condition. Do not smoke cigarettes or drink alcohol. These increase irritation in your stomach and can increase the time it will take for ulcers to heal. Cigarettes and alcohol can also worsen acid reflux or heartburn. If you get black, tarry stools or vomit up what looks like coffee grounds, call your doctor or health care professional at once. You may have a bleeding ulcer. This medicine may cause a decrease in vitamin B12. You should make sure that you get enough vitamin B12 while you are taking this medicine. Discuss the foods you eat and the vitamins you take with your health care professional. What side effects may I notice from receiving this medicine? Side effects that you should report to your doctor or health care professional as soon as possible:  allergic reactions like skin rash, itching or hives, swelling of the face, lips, or tongue  agitation, nervousness  confusion  hallucinations Side effects that usually do not require medical attention (report to your doctor or health care professional if they continue or are bothersome):  constipation  diarrhea  dizziness  headache This list may not describe all possible side effects. Call your doctor for medical advice about side effects. You may report side effects to FDA at 1-800-FDA-1088. Where should I keep my medicine? This medicine is given in a hospital or clinic. You will not be given this medicine to store at home. NOTE: This sheet is a summary. It may not cover all possible information. If you have questions about this medicine, talk to your doctor, pharmacist, or health care provider.  2021 Elsevier/Gold Standard (2017-05-07 13:16:46)  Paclitaxel  injection What is this medicine? PACLITAXEL (PAK li TAX el) is a chemotherapy drug. It targets fast dividing cells, like cancer cells, and causes these cells to die. This medicine is used to treat ovarian cancer, breast cancer, lung cancer, Kaposi's sarcoma, and other cancers. This medicine may  be used for other purposes; ask your health care provider or pharmacist if you have questions. COMMON BRAND NAME(S): Onxol, Taxol What should I tell my health care provider before I take this medicine? They need to know if you have any of these conditions:  history of irregular heartbeat  liver disease  low blood counts, like low Kashawn Manzano cell, platelet, or red cell counts  lung or breathing disease, like asthma  tingling of the fingers or toes, or other nerve disorder  an unusual or allergic reaction to paclitaxel, alcohol, polyoxyethylated castor oil, other chemotherapy, other medicines, foods, dyes, or preservatives  pregnant or trying to get pregnant  breast-feeding How should I use this medicine? This drug is given as an infusion into a vein. It is administered in a hospital or clinic by a specially trained health care professional. Talk to your pediatrician regarding the use of this medicine in children. Special care may be needed. Overdosage: If you think you have taken too much of this medicine contact a poison control center or emergency room at once. NOTE: This medicine is only for you. Do not share this medicine with others. What if I miss a dose? It is important not to miss your dose. Call your doctor or health care professional if you are unable to keep an appointment. What may interact with this medicine? Do not take this medicine with any of the following medications:  live virus vaccines This medicine may also interact with the following medications:  antiviral medicines for hepatitis, HIV or AIDS  certain antibiotics like erythromycin and clarithromycin  certain medicines  for fungal infections like ketoconazole and itraconazole  certain medicines for seizures like carbamazepine, phenobarbital, phenytoin  gemfibrozil  nefazodone  rifampin  St. John's wort This list may not describe all possible interactions. Give your health care provider a list of all the medicines, herbs, non-prescription drugs, or dietary supplements you use. Also tell them if you smoke, drink alcohol, or use illegal drugs. Some items may interact with your medicine. What should I watch for while using this medicine? Your condition will be monitored carefully while you are receiving this medicine. You will need important blood work done while you are taking this medicine. This medicine can cause serious allergic reactions. To reduce your risk you will need to take other medicine(s) before treatment with this medicine. If you experience allergic reactions like skin rash, itching or hives, swelling of the face, lips, or tongue, tell your doctor or health care professional right away. In some cases, you may be given additional medicines to help with side effects. Follow all directions for their use. This drug may make you feel generally unwell. This is not uncommon, as chemotherapy can affect healthy cells as well as cancer cells. Report any side effects. Continue your course of treatment even though you feel ill unless your doctor tells you to stop. Call your doctor or health care professional for advice if you get a fever, chills or sore throat, or other symptoms of a cold or flu. Do not treat yourself. This drug decreases your body's ability to fight infections. Try to avoid being around people who are sick. This medicine may increase your risk to bruise or bleed. Call your doctor or health care professional if you notice any unusual bleeding. Be careful brushing and flossing your teeth or using a toothpick because you may get an infection or bleed more easily. If you have any dental work done,  tell your dentist you are  receiving this medicine. Avoid taking products that contain aspirin, acetaminophen, ibuprofen, naproxen, or ketoprofen unless instructed by your doctor. These medicines may hide a fever. Do not become pregnant while taking this medicine. Women should inform their doctor if they wish to become pregnant or think they might be pregnant. There is a potential for serious side effects to an unborn child. Talk to your health care professional or pharmacist for more information. Do not breast-feed an infant while taking this medicine. Men are advised not to father a child while receiving this medicine. This product may contain alcohol. Ask your pharmacist or healthcare provider if this medicine contains alcohol. Be sure to tell all healthcare providers you are taking this medicine. Certain medicines, like metronidazole and disulfiram, can cause an unpleasant reaction when taken with alcohol. The reaction includes flushing, headache, nausea, vomiting, sweating, and increased thirst. The reaction can last from 30 minutes to several hours. What side effects may I notice from receiving this medicine? Side effects that you should report to your doctor or health care professional as soon as possible:  allergic reactions like skin rash, itching or hives, swelling of the face, lips, or tongue  breathing problems  changes in vision  fast, irregular heartbeat  high or low blood pressure  mouth sores  pain, tingling, numbness in the hands or feet  signs of decreased platelets or bleeding - bruising, pinpoint red spots on the skin, black, tarry stools, blood in the urine  signs of decreased red blood cells - unusually weak or tired, feeling faint or lightheaded, falls  signs of infection - fever or chills, cough, sore throat, pain or difficulty passing urine  signs and symptoms of liver injury like dark yellow or brown urine; general ill feeling or flu-like symptoms; light-colored  stools; loss of appetite; nausea; right upper belly pain; unusually weak or tired; yellowing of the eyes or skin  swelling of the ankles, feet, hands  unusually slow heartbeat Side effects that usually do not require medical attention (report to your doctor or health care professional if they continue or are bothersome):  diarrhea  hair loss  loss of appetite  muscle or joint pain  nausea, vomiting  pain, redness, or irritation at site where injected  tiredness This list may not describe all possible side effects. Call your doctor for medical advice about side effects. You may report side effects to FDA at 1-800-FDA-1088. Where should I keep my medicine? This drug is given in a hospital or clinic and will not be stored at home. NOTE: This sheet is a summary. It may not cover all possible information. If you have questions about this medicine, talk to your doctor, pharmacist, or health care provider.  2021 [employer]vier/Gold Standard (2019-08-23 13:37:23)

## 2021-02-26 ENCOUNTER — Other Ambulatory Visit: Payer: Self-pay | Admitting: Oncology

## 2021-02-26 NOTE — Progress Notes (Signed)
Added B12 inj to infusion appt notes.

## 2021-03-04 ENCOUNTER — Inpatient Hospital Stay (HOSPITAL_BASED_OUTPATIENT_CLINIC_OR_DEPARTMENT_OTHER): Payer: 59 | Admitting: Oncology

## 2021-03-04 ENCOUNTER — Encounter: Payer: Self-pay | Admitting: Oncology

## 2021-03-04 ENCOUNTER — Inpatient Hospital Stay: Payer: 59

## 2021-03-04 ENCOUNTER — Other Ambulatory Visit: Payer: Self-pay

## 2021-03-04 ENCOUNTER — Other Ambulatory Visit: Payer: Self-pay | Admitting: Oncology

## 2021-03-04 VITALS — HR 97

## 2021-03-04 VITALS — BP 132/72 | HR 109 | Temp 98.3°F | Resp 16 | Wt 195.6 lb

## 2021-03-04 DIAGNOSIS — Z5111 Encounter for antineoplastic chemotherapy: Secondary | ICD-10-CM | POA: Diagnosis not present

## 2021-03-04 DIAGNOSIS — C50919 Malignant neoplasm of unspecified site of unspecified female breast: Secondary | ICD-10-CM

## 2021-03-04 DIAGNOSIS — C50412 Malignant neoplasm of upper-outer quadrant of left female breast: Secondary | ICD-10-CM

## 2021-03-04 DIAGNOSIS — Z171 Estrogen receptor negative status [ER-]: Secondary | ICD-10-CM

## 2021-03-04 LAB — CBC WITH DIFFERENTIAL/PLATELET
Abs Immature Granulocytes: 0.07 10*3/uL (ref 0.00–0.07)
Basophils Absolute: 0.1 10*3/uL (ref 0.0–0.1)
Basophils Relative: 1 %
Eosinophils Absolute: 0.3 10*3/uL (ref 0.0–0.5)
Eosinophils Relative: 4 %
HCT: 30.8 % — ABNORMAL LOW (ref 36.0–46.0)
Hemoglobin: 10.9 g/dL — ABNORMAL LOW (ref 12.0–15.0)
Immature Granulocytes: 1 %
Lymphocytes Relative: 43 %
Lymphs Abs: 3.1 10*3/uL (ref 0.7–4.0)
MCH: 33.6 pg (ref 26.0–34.0)
MCHC: 35.4 g/dL (ref 30.0–36.0)
MCV: 95.1 fL (ref 80.0–100.0)
Monocytes Absolute: 0.6 10*3/uL (ref 0.1–1.0)
Monocytes Relative: 8 %
Neutro Abs: 3.1 10*3/uL (ref 1.7–7.7)
Neutrophils Relative %: 43 %
Platelets: 200 10*3/uL (ref 150–400)
RBC: 3.24 MIL/uL — ABNORMAL LOW (ref 3.87–5.11)
RDW: 18.9 % — ABNORMAL HIGH (ref 11.5–15.5)
WBC: 7.2 10*3/uL (ref 4.0–10.5)
nRBC: 0 % (ref 0.0–0.2)

## 2021-03-04 LAB — COMPREHENSIVE METABOLIC PANEL
ALT: 48 U/L — ABNORMAL HIGH (ref 0–44)
AST: 32 U/L (ref 15–41)
Albumin: 4.5 g/dL (ref 3.5–5.0)
Alkaline Phosphatase: 55 U/L (ref 38–126)
Anion gap: 13 (ref 5–15)
BUN: 12 mg/dL (ref 6–20)
CO2: 21 mmol/L — ABNORMAL LOW (ref 22–32)
Calcium: 9.2 mg/dL (ref 8.9–10.3)
Chloride: 101 mmol/L (ref 98–111)
Creatinine, Ser: 0.96 mg/dL (ref 0.44–1.00)
GFR, Estimated: >60 mL/min (ref >60)
Glucose, Bld: 102 mg/dL — ABNORMAL HIGH (ref 70–99)
Potassium: 3.2 mmol/L — ABNORMAL LOW (ref 3.5–5.1)
Sodium: 135 mmol/L (ref 135–145)
Total Bilirubin: 0.7 mg/dL (ref 0.3–1.2)
Total Protein: 7 g/dL (ref 6.5–8.1)

## 2021-03-04 MED ORDER — SODIUM CHLORIDE 0.9 % IV SOLN
Freq: Once | INTRAVENOUS | Status: AC
  Filled 2021-03-04: qty 250

## 2021-03-04 MED ORDER — FAMOTIDINE 20 MG IN NS 100 ML IVPB
20.0000 mg | Freq: Once | INTRAVENOUS | Status: AC
  Administered 2021-03-04: 20 mg via INTRAVENOUS
  Filled 2021-03-04: qty 20

## 2021-03-04 MED ORDER — HEPARIN SOD (PORK) LOCK FLUSH 100 UNIT/ML IV SOLN
INTRAVENOUS | Status: AC
  Filled 2021-03-04: qty 5

## 2021-03-04 MED ORDER — CYANOCOBALAMIN 1000 MCG/ML IJ SOLN
1000.0000 ug | INTRAMUSCULAR | Status: DC
  Administered 2021-03-04: 1000 ug via INTRAMUSCULAR
  Filled 2021-03-04: qty 1

## 2021-03-04 MED ORDER — DIPHENHYDRAMINE HCL 50 MG/ML IJ SOLN
50.0000 mg | Freq: Once | INTRAMUSCULAR | Status: AC
  Administered 2021-03-04: 50 mg via INTRAVENOUS
  Filled 2021-03-04: qty 1

## 2021-03-04 MED ORDER — PALONOSETRON HCL INJECTION 0.25 MG/5ML
0.2500 mg | Freq: Once | INTRAVENOUS | Status: AC
  Administered 2021-03-04: 0.25 mg via INTRAVENOUS
  Filled 2021-03-04: qty 5

## 2021-03-04 MED ORDER — HEPARIN SOD (PORK) LOCK FLUSH 100 UNIT/ML IV SOLN
500.0000 [IU] | Freq: Once | INTRAVENOUS | Status: AC | PRN
  Administered 2021-03-04: 500 [IU]
  Filled 2021-03-04: qty 5

## 2021-03-04 MED ORDER — SODIUM CHLORIDE 0.9 % IV SOLN
10.0000 mg | Freq: Once | INTRAVENOUS | Status: AC
  Administered 2021-03-04: 10 mg via INTRAVENOUS
  Filled 2021-03-04: qty 10

## 2021-03-04 MED ORDER — SODIUM CHLORIDE 0.9 % IV SOLN
65.0000 mg/m2 | Freq: Once | INTRAVENOUS | Status: AC
  Administered 2021-03-04: 132 mg via INTRAVENOUS
  Filled 2021-03-04: qty 22

## 2021-03-04 MED ORDER — SODIUM CHLORIDE 0.9% FLUSH
10.0000 mL | Freq: Once | INTRAVENOUS | Status: AC
Start: 2021-03-04 — End: 2021-03-04
  Administered 2021-03-04: 10 mL via INTRAVENOUS
  Filled 2021-03-04: qty 10

## 2021-03-04 NOTE — Progress Notes (Signed)
Hematology/Oncology Consult note [facility]  [phone] [phone]  Patient Care Team: Juluis Pitch, MD as PCP - General (Family Medicine) Sindy Guadeloupe, MD as Consulting Physician (Hematology and Oncology)   Name of the patient: Vickie Mathis  [id number]  01-01-1991   Date of visit: 03/04/21  Diagnosis- locally advanced 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 breast cancer at least T2 N1 M0  Chief complaint/ Reason for visit-on treatment assessment prior to last cycle of Taxol (Cycle 12). Carbo discontinued secondary to reaction.   Heme/Onc history: patient is a 30 year old female who self palpated a left breast mass about 10 months ago and since then it has been slowly growing. She sought medical attention recently after thinking it was a possible cyst for all this while.She underwent a diagnostic bilateral mammogram and ultrasound which showed a 2.7 cm mass at the 12 o'clock position 6 cm from the nipple. Ultrasound of the left axilla demonstrates 2 lymph nodes with mild thickened cortices of 4 mm. There is skin thickening on the lower inner quadrant of the left breast on mammography.Both the breast mass and the lymph node were biopsied and was positive for invasive mammary carcinoma grade 3. Lymph node was also suspicious for extracapsular extension.ER/PR and HER-2 negative  MRI showed irregular enhancing mass at the 12 o'clock position of the left breast measuring 2.5 x 2.3 cm and a linear component extending 2.3 cm anteriorly. The mass in the anterior linear extension combined measure 4.3 cm. 3.9 cm in the cephalocaudal dimension. Also an area of clumped non-mass enhancement in the posterior aspect of the lower quadrant of the left breast measuring 2.6 x 0.9 cm which looks suspicious. 2 enlarged left axillary lymph nodes and 2 mildly enlarged internal mammary lymph nodes. 4.3 cm clumped area of non-mass enhancement in the outer quadrant of  the right breast. 3 right axillary lymph nodes with mild cortical thickening.  Patient underwent biopsy of the right breast non-mass enhancement andthat was negative for malignancy  CT scan showed mildly enlarged right inguinal lymph nodes nonspecific. Left upper breast mass with prominent left axillary lymph nodes. Subcentimeter pulmonary nodules which are calcified and compatible with benign old granulomatous disease. 0.6 5.4 cm lucent lesion in the left first rib possibly a hemangioma or benign lesion.  Patient developed significant infusion reaction to carboplatin with dose 9 when her blood pressure dropped and she became tachycardic and significantly nauseous.  Interval history-patient reports feeling tired since last treatment.  Reports some weight loss since last visit. Has discontinued Lasix.  Feels less bloated from her previous visit.  Neuropathy is stable. Denies recurrent dizziness.   ECOG PS- 1 Pain scale- 0   Review of systems- Review of Systems  Constitutional: Positive for malaise/fatigue and weight loss. Negative for chills and fever.  HENT: Negative for congestion, ear pain and tinnitus.   Eyes: Negative.  Negative for blurred vision and double vision.  Respiratory: Negative.  Negative for cough, sputum production and shortness of breath.   Cardiovascular: Negative.  Negative for chest pain, palpitations and leg swelling.  Gastrointestinal: Negative.  Negative for abdominal pain, constipation, diarrhea, nausea and vomiting.  Genitourinary: Negative for dysuria, frequency and urgency.  Musculoskeletal: Negative for back pain and falls.  Skin: Negative.  Negative for rash.  Neurological: Negative.  Negative for weakness and headaches.  Endo/Heme/Allergies: Negative.  Does not bruise/bleed easily.  Psychiatric/Behavioral: Negative.  Negative for depression. The patient is not nervous/anxious and does not have insomnia.  Allergies  Allergen Reactions  .  Carboplatin Shortness Of Breath, Nausea And Vomiting and Other (See Comments)    Flushing- chest , face, neck , arm including hand  . Amoxicillin     Other reaction(s): "too young to remember what they do to me"  . Sulfa Antibiotics     Other reaction(s): "too young to remember what they do to me"     Past Medical History:  Diagnosis Date  . Anxiety   . Asthma   . Breast cancer ([place])   . Depression   . Family history of cancer   . Varicose veins of bilateral lower extremities with pain      Past Surgical History:  Procedure Laterality Date  . APPENDECTOMY  2018  . BREAST BIOPSY Left 11/26/2020   vision 12:00 6cmfn path pending  . BREAST BIOPSY Left 11/26/2020   LN t3 marler path pending  . PORTACATH PLACEMENT Right 12/13/2020   Procedure: INSERTION PORT-A-CATH;  Surgeon: Herbert Pun, MD;  Location: [facility] ORS;  Service: General;  Laterality: Right;    Social History   Socioeconomic History  . Marital status: Married    Spouse name: Not on file  . Number of children: Not on file  . Years of education: Not on file  . Highest education level: Not on file  Occupational History  . Not on file  Tobacco Use  . Smoking status: Never Smoker  . Smokeless tobacco: Never Used  Vaping Use  . Vaping Use: Never used  Substance and Sexual Activity  . Alcohol use: Yes    Comment: occassinally   . Drug use: Yes    Types: Marijuana  . Sexual activity: Yes  Other Topics Concern  . Not on file  Social History Narrative  . Not on file   Social Determinants of Health   Financial Resource Strain: Not on file  Food Insecurity: Not on file  Transportation Needs: Not on file  Physical Activity: Not on file  Stress: Not on file  Social Connections: Not on file  Intimate Partner Violence: Not on file    Family History  Problem Relation Age of Onset  . Diabetes Father   . Varicose Veins Father   . Diabetes Paternal Aunt   . Uterine cancer Paternal Aunt         precancerous  . Diabetes Paternal Grandmother   . Uterine cancer Paternal Grandmother        precancerous  . Thyroid disease Mother   . Thyroid disease Maternal Aunt   . Thyroid disease Maternal Grandmother   . Cancer Other        stomach vs ovarian/cervical  . Cancer Paternal Great-grandmother        unk     Current Outpatient Medications:  .  acetaminophen (TYLENOL) 325 MG tablet, , Disp: , Rfl:  .  albuterol (VENTOLIN HFA) 108 (90 Base) MCG/ACT inhaler, Inhale 1-2 puffs into the lungs every 6 (six) hours as needed for shortness of breath or wheezing., Disp: , Rfl:  .  Ascorbic Acid (SUPER C COMPLEX PO), Take 1 capsule by mouth daily., Disp: , Rfl:  .  busPIRone (BUSPAR) 15 MG tablet, Take 15 mg by mouth 2 (two) times daily., Disp: , Rfl:  .  citalopram (CELEXA) 40 MG tablet, Take 40 mg by mouth daily., Disp: , Rfl:  .  clonazePAM (KLONOPIN) 0.5 MG tablet, Take 1 tablet (0.5 mg total) by mouth 3 (three) times daily as needed., Disp: 60 tablet, Rfl:  0 .  dexamethasone (DECADRON) 4 MG tablet, Take 2 tablets (8 mg total) by mouth daily. Start the day after chemotherapy for 2 days., Disp: 30 tablet, Rfl: 1 .  folic acid (FOLVITE) 1 MG tablet, Take 2 tablets (2 mg total) by mouth daily., Disp: 60 tablet, Rfl: 2 .  furosemide (LASIX) 20 MG tablet, Take 1 tablet (20 mg total) by mouth daily., Disp: 5 tablet, Rfl: 0 .  ibuprofen (ADVIL) 200 MG tablet, , Disp: , Rfl:  .  lidocaine-prilocaine (EMLA) cream, Apply to affected area once, Disp: 30 g, Rfl: 3 .  LORazepam (ATIVAN) 0.5 MG tablet, TAKE 1 TABLET (0.5 MG TOTAL) BY MOUTH EVERY 6 (SIX) HOURS AS NEEDED (NAUSEA OR VOMITING)., Disp: 30 tablet, Rfl: 0 .  norethindrone-ethinyl estradiol (LOESTRIN FE) 1-20 MG-MCG tablet, Take 1 tablet by mouth daily. (Patient not taking: No sig reported), Disp: , Rfl:  .  ondansetron (ZOFRAN) 8 MG tablet, Take 1 tablet (8 mg total) by mouth 2 (two) times daily as needed for refractory nausea / vomiting. Start on  day 3 after chemo. (Patient not taking: No sig reported), Disp: 30 tablet, Rfl: 1 .  prochlorperazine (COMPAZINE) 10 MG tablet, Take 1 tablet (10 mg total) by mouth every 6 (six) hours as needed (Nausea or vomiting). (Patient not taking: No sig reported), Disp: 30 tablet, Rfl: 1 .  triamcinolone ointment (KENALOG) 0.5 %, Apply 1 application topically 2 (two) times daily. (Patient not taking: No sig reported), Disp: 30 g, Rfl: 0 No current facility-administered medications for this visit.  Facility-Administered Medications Ordered in Other Visits:  .  cyanocobalamin ((VITAMIN B-12)) injection 1,000 mcg, 1,000 mcg, Intramuscular, Weekly, Sindy Guadeloupe, MD, 1,000 mcg at 03/04/21 1517 .  dexamethasone (DECADRON) 10 mg in sodium chloride 0.9 % 50 mL IVPB, 10 mg, Intravenous, Once, Sindy Guadeloupe, MD, Last Rate: 204 mL/hr at 03/04/21 1517, 10 mg at 03/04/21 1517 .  heparin lock flush 100 unit/mL, 500 Units, Intracatheter, Once PRN, Sindy Guadeloupe, MD .  PACLitaxel (TAXOL) 132 mg in sodium chloride 0.9 % 250 mL chemo infusion (</= $RemoveBefor'80mg'jBCeogbINQqQ$ /m2), 65 mg/m2 (Treatment Plan Recorded), Intravenous, Once, Sindy Guadeloupe, MD  Physical exam:  Vitals:   03/04/21 1330  BP: 132/72  Pulse: (!) 109  Resp: 16  Temp: 98.3 F (36.8 C)  TempSrc: Tympanic  SpO2: 100%  Weight: 195 lb 9.6 oz (88.7 kg)   Physical Exam Constitutional:      General: She is not in acute distress. Cardiovascular:     Rate and Rhythm: Normal rate and regular rhythm.     Heart sounds: Normal heart sounds.  Pulmonary:     Effort: Pulmonary effort is normal.     Breath sounds: Normal breath sounds.  Abdominal:     General: Bowel sounds are normal.     Palpations: Abdomen is soft.  Skin:    General: Skin is warm and dry.  Neurological:     Mental Status: She is alert and oriented to person, place, and time.      CMP Latest Ref Rng & Units 03/04/2021  Glucose 70 - 99 mg/dL 102(H)  BUN 6 - 20 mg/dL 12  Creatinine 0.44 - 1.00 mg/dL  0.96  Sodium 135 - 145 mmol/L 135  Potassium 3.5 - 5.1 mmol/L 3.2(L)  Chloride 98 - 111 mmol/L 101  CO2 22 - 32 mmol/L 21(L)  Calcium 8.9 - 10.3 mg/dL 9.2  Total Protein 6.5 - 8.1 g/dL 7.0  Total Bilirubin 0.3 -  1.2 mg/dL 0.7  Alkaline Phos 38 - 126 U/L 55  AST 15 - 41 U/L 32  ALT 0 - 44 U/L 48(H)   CBC Latest Ref Rng & Units 03/04/2021  WBC 4.0 - 10.5 K/uL 7.2  Hemoglobin 12.0 - 15.0 g/dL 10.9(L)  Hematocrit 36.0 - 46.0 % 30.8(L)  Platelets 150 - 400 K/uL 200     Assessment and plan- Patient is a 30 y.o. female with stage IIIb triple negative breast cancer of the left breast cT2 N1 M0.She is here for on treatment assessment prior to Cycle 12 Taxol.  Given the significant reaction to carboplatin she will not be getting any further carboplatin at this time.    Proceed with Cycle 12 Taxol.  Labs are stable. She is scheduled to return to clinic on 03/18/2021 for lab work, MD assessment and to initiate [facility] plus [place].  She will get this every 3 weeks for 4 cycles.  She has a scheduled ultrasound on 03/06/2021.  She has a baseline echocardiogram scheduled for 03/14/2021.  Patient is leaning towards a bilateral mastectomy with reconstruction.  She has been referred to Dr. Marla Roe.   She has had some weight loss since her last visit here.  She is down approximately 10 pounds since 02/18/2021.  Blood pressure stable.  Previously given Lasix for fluid retention. She is currently not taking due to dizziness.  Chemo induced anemia-hemoglobin is 10.9 today.  Previous labs did show a low folate level.  She was instructed to start folic acid 1 mg daily and B12 supplements.  Hypokalemia-recommend potassium rich food.  Patient is also on ovarian suppression while on chemotherapy and will get her next dose of Lupron on 03/18/2021  Greater than 50% was spent in counseling and coordination of care with this patient including but not limited to discussion of the relevant topics above (See A&P)  including, but not limited to diagnosis and management of acute and chronic medical conditions.   Visit Diagnosis 1. Invasive carcinoma of breast ([place])     Faythe Casa, NP 03/04/2021 3:27 PM

## 2021-03-04 NOTE — Patient Instructions (Signed)
[facility]  Discharge Instructions: Thank you for choosing [facility] to provide your oncology and hematology care.  If you have a lab appointment with the Cancer Center, please go directly to the Cancer Center and check in at the registration area.  Wear comfortable clothing and clothing appropriate for easy access to any Portacath or PICC line.   We strive to give you quality time with your provider. You may need to reschedule your appointment if you arrive late (15 or more minutes).  Arriving late affects you and other patients whose appointments are after yours.  Also, if you miss three or more appointments without notifying the office, you may be dismissed from the clinic at the provider's discretion.      For prescription refill requests, have your pharmacy contact our office and allow 72 hours for refills to be completed.    Today you received the following chemotherapy and/or immunotherapy agents TaxolPaclitaxel injection What is this medicine? PACLITAXEL (PAK li TAX el) is a chemotherapy drug. It targets fast dividing cells, like cancer cells, and causes these cells to die. This medicine is used to treat ovarian cancer, breast cancer, lung cancer, Kaposi's sarcoma, and other cancers. This medicine may be used for other purposes; ask your health care provider or pharmacist if you have questions. COMMON BRAND NAME(S): Onxol, Taxol What should I tell my health care provider before I take this medicine? They need to know if you have any of these conditions:  history of irregular heartbeat  liver disease  low blood counts, like low white cell, platelet, or red cell counts  lung or breathing disease, like asthma  tingling of the fingers or toes, or other nerve disorder  an unusual or allergic reaction to paclitaxel, alcohol, polyoxyethylated castor oil, other chemotherapy, other medicines, foods, dyes, or preservatives  pregnant or  trying to get pregnant  breast-feeding How should I use this medicine? This drug is given as an infusion into a vein. It is administered in a hospital or clinic by a specially trained health care professional. Talk to your pediatrician regarding the use of this medicine in children. Special care may be needed. Overdosage: If you think you have taken too much of this medicine contact a poison control center or emergency room at once. NOTE: This medicine is only for you. Do not share this medicine with others. What if I miss a dose? It is important not to miss your dose. Call your doctor or health care professional if you are unable to keep an appointment. What may interact with this medicine? Do not take this medicine with any of the following medications:  live virus vaccines This medicine may also interact with the following medications:  antiviral medicines for hepatitis, HIV or AIDS  certain antibiotics like erythromycin and clarithromycin  certain medicines for fungal infections like ketoconazole and itraconazole  certain medicines for seizures like carbamazepine, phenobarbital, phenytoin  gemfibrozil  nefazodone  rifampin  St. John's wort This list may not describe all possible interactions. Give your health care provider a list of all the medicines, herbs, non-prescription drugs, or dietary supplements you use. Also tell them if you smoke, drink alcohol, or use illegal drugs. Some items may interact with your medicine. What should I watch for while using this medicine? Your condition will be monitored carefully while you are receiving this medicine. You will need important blood work done while you are taking this medicine. This medicine can cause serious   allergic reactions. To reduce your risk you will need to take other medicine(s) before treatment with this medicine. If you experience allergic reactions like skin rash, itching or hives, swelling of the face, lips, or  tongue, tell your doctor or health care professional right away. In some cases, you may be given additional medicines to help with side effects. Follow all directions for their use. This drug may make you feel generally unwell. This is not uncommon, as chemotherapy can affect healthy cells as well as cancer cells. Report any side effects. Continue your course of treatment even though you feel ill unless your doctor tells you to stop. Call your doctor or health care professional for advice if you get a fever, chills or sore throat, or other symptoms of a cold or flu. Do not treat yourself. This drug decreases your body's ability to fight infections. Try to avoid being around people who are sick. This medicine may increase your risk to bruise or bleed. Call your doctor or health care professional if you notice any unusual bleeding. Be careful brushing and flossing your teeth or using a toothpick because you may get an infection or bleed more easily. If you have any dental work done, tell your dentist you are receiving this medicine. Avoid taking products that contain aspirin, acetaminophen, ibuprofen, naproxen, or ketoprofen unless instructed by your doctor. These medicines may hide a fever. Do not become pregnant while taking this medicine. Women should inform their doctor if they wish to become pregnant or think they might be pregnant. There is a potential for serious side effects to an unborn child. Talk to your health care professional or pharmacist for more information. Do not breast-feed an infant while taking this medicine. Men are advised not to father a child while receiving this medicine. This product may contain alcohol. Ask your pharmacist or healthcare provider if this medicine contains alcohol. Be sure to tell all healthcare providers you are taking this medicine. Certain medicines, like metronidazole and disulfiram, can cause an unpleasant reaction when taken with alcohol. The reaction includes  flushing, headache, nausea, vomiting, sweating, and increased thirst. The reaction can last from 30 minutes to several hours. What side effects may I notice from receiving this medicine? Side effects that you should report to your doctor or health care professional as soon as possible:  allergic reactions like skin rash, itching or hives, swelling of the face, lips, or tongue  breathing problems  changes in vision  fast, irregular heartbeat  high or low blood pressure  mouth sores  pain, tingling, numbness in the hands or feet  signs of decreased platelets or bleeding - bruising, pinpoint red spots on the skin, black, tarry stools, blood in the urine  signs of decreased red blood cells - unusually weak or tired, feeling faint or lightheaded, falls  signs of infection - fever or chills, cough, sore throat, pain or difficulty passing urine  signs and symptoms of liver injury like dark yellow or brown urine; general ill feeling or flu-like symptoms; light-colored stools; loss of appetite; nausea; right upper belly pain; unusually weak or tired; yellowing of the eyes or skin  swelling of the ankles, feet, hands  unusually slow heartbeat Side effects that usually do not require medical attention (report to your doctor or health care professional if they continue or are bothersome):  diarrhea  hair loss  loss of appetite  muscle or joint pain  nausea, vomiting  pain, redness, or irritation at site where injected    tiredness This list may not describe all possible side effects. Call your doctor for medical advice about side effects. You may report side effects to FDA at 1-800-FDA-1088. Where should I keep my medicine? This drug is given in a hospital or clinic and will not be stored at home. NOTE: This sheet is a summary. It may not cover all possible information. If you have questions about this medicine, talk to your doctor, pharmacist, or health care provider.  2021  Elsevier/Gold Standard (2019-08-23 13:37:23)       To help prevent nausea and vomiting after your treatment, we encourage you to take your nausea medication as directed.  BELOW ARE SYMPTOMS THAT SHOULD BE REPORTED IMMEDIATELY: . *FEVER GREATER THAN 100.4 F (38 C) OR HIGHER . *CHILLS OR SWEATING . *NAUSEA AND VOMITING THAT IS NOT CONTROLLED WITH YOUR NAUSEA MEDICATION . *UNUSUAL SHORTNESS OF BREATH . *UNUSUAL BRUISING OR BLEEDING . *URINARY PROBLEMS (pain or burning when urinating, or frequent urination) . *BOWEL PROBLEMS (unusual diarrhea, constipation, pain near the anus) . TENDERNESS IN MOUTH AND THROAT WITH OR WITHOUT PRESENCE OF ULCERS (sore throat, sores in mouth, or a toothache) . UNUSUAL RASH, SWELLING OR PAIN  . UNUSUAL VAGINAL DISCHARGE OR ITCHING   Items with * indicate a potential emergency and should be followed up as soon as possible or go to the Emergency Department if any problems should occur.  Please show the CHEMOTHERAPY ALERT CARD or IMMUNOTHERAPY ALERT CARD at check-in to the Emergency Department and triage nurse.  Should you have questions after your visit or need to cancel or reschedule your appointment, please contact [facility]  [phone] and follow the prompts.  Office hours are 8:00 a.m. to 4:30 p.m. Monday - Friday. Please note that voicemails left after 4:00 p.m. may not be returned until the following business day.  We are closed weekends and major holidays. You have access to a nurse at all times for urgent questions. Please call the main number to the clinic [phone] and follow the prompts.  For any non-urgent questions, you may also contact your provider using MyChart. We now offer e-Visits for anyone 18 and older to request care online for non-urgent symptoms. For details visit [email or web address].   Also download the MyChart app! Go to the app store, search "MyChart", open the app, select [facility], and log  in with your MyChart username and password.  Due to Covid, a mask is required upon entering the hospital/clinic. If you do not have a mask, one will be given to you upon arrival. For doctor visits, patients may have 1 support person aged 18 or older with them. For treatment visits, patients cannot have anyone with them due to current Covid guidelines and our immunocompromised population.  

## 2021-03-05 ENCOUNTER — Encounter: Payer: Self-pay | Admitting: Internal Medicine

## 2021-03-05 ENCOUNTER — Other Ambulatory Visit: Payer: Self-pay | Admitting: Oncology

## 2021-03-05 ENCOUNTER — Encounter: Payer: Self-pay | Admitting: Oncology

## 2021-03-05 DIAGNOSIS — C50412 Malignant neoplasm of upper-outer quadrant of left female breast: Secondary | ICD-10-CM

## 2021-03-05 DIAGNOSIS — Z171 Estrogen receptor negative status [ER-]: Secondary | ICD-10-CM

## 2021-03-05 MED ORDER — CLONAZEPAM 0.5 MG PO TABS: 0.5000 mg | ORAL_TABLET | Freq: Three times a day (TID) | ORAL | 0 refills | Status: DC | PRN

## 2021-03-05 MED ORDER — LIDOCAINE-PRILOCAINE 2.5-2.5 % EX CREA: TOPICAL_CREAM | CUTANEOUS | 3 refills | Status: DC

## 2021-03-06 ENCOUNTER — Ambulatory Visit
Admission: RE | Admit: 2021-03-06 | Discharge: 2021-03-06 | Disposition: A | Discharge status: Home / Self Care | Payer: 59 | Source: Ambulatory Visit | Attending: Oncology | Admitting: Oncology

## 2021-03-06 ENCOUNTER — Other Ambulatory Visit: Payer: Self-pay

## 2021-03-06 DIAGNOSIS — C50919 Malignant neoplasm of unspecified site of unspecified female breast: Secondary | ICD-10-CM | POA: Diagnosis present

## 2021-03-06 IMAGING — US US BREAST*L* LIMITED INC AXILLA
1 series · 11 of 11 positions shown · non-contrast
Comparison: Left breast ultrasound dated [DATE].

CLINICAL DATA: Follow-up neoadjuvant chemotherapy for left breast
triple negative invasive mammary carcinoma and metastatic left
axillary lymph node.

EXAM:
ULTRASOUND OF THE LEFT BREAST

[Series 1: us breast*left* limited inc axilla · 0.06mm/px · 11 of 11 slices shown]
[im 1/11]
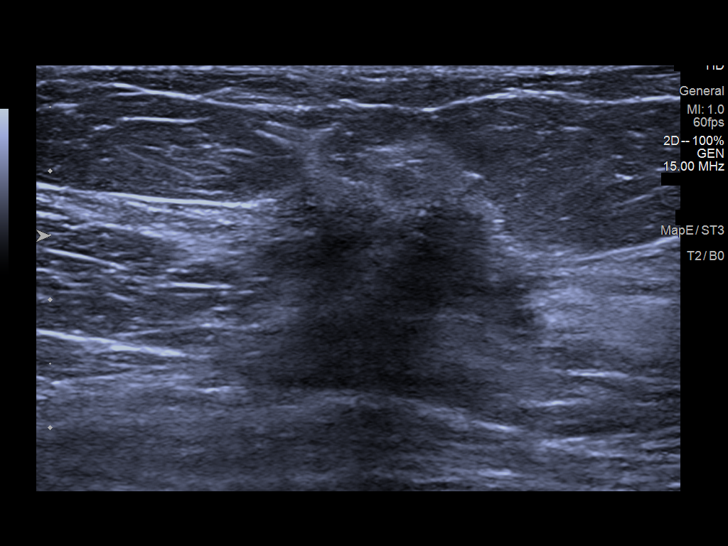
[im 2/11]
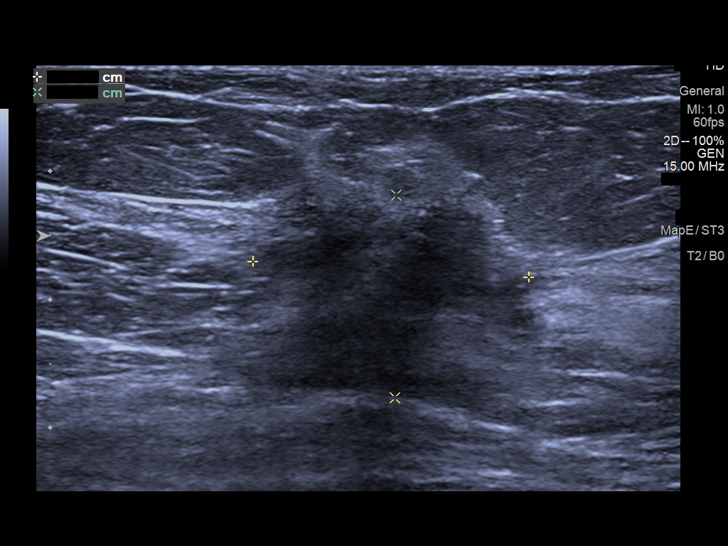
[im 3/11]
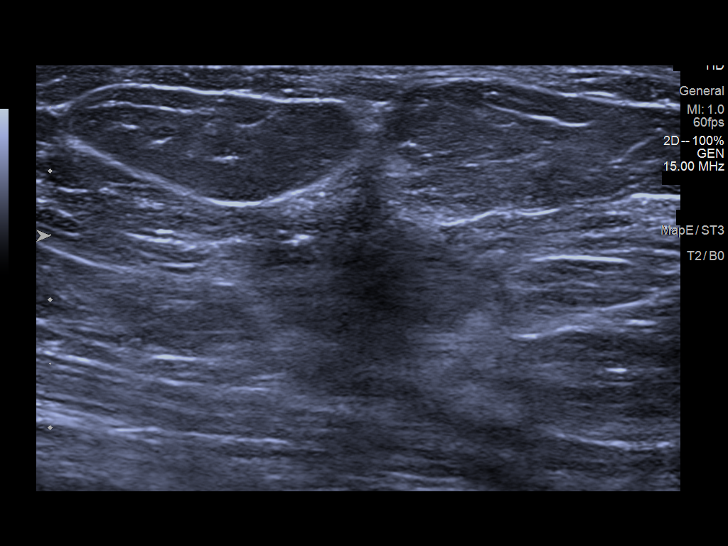
[im 4/11]
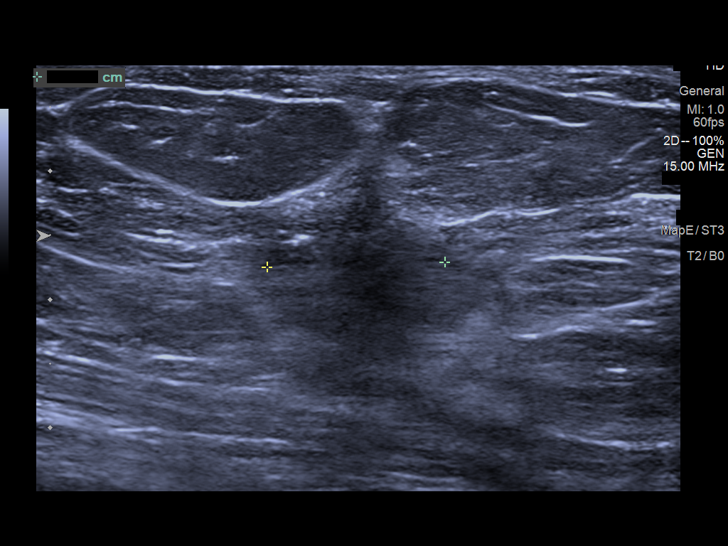
[im 5/11]
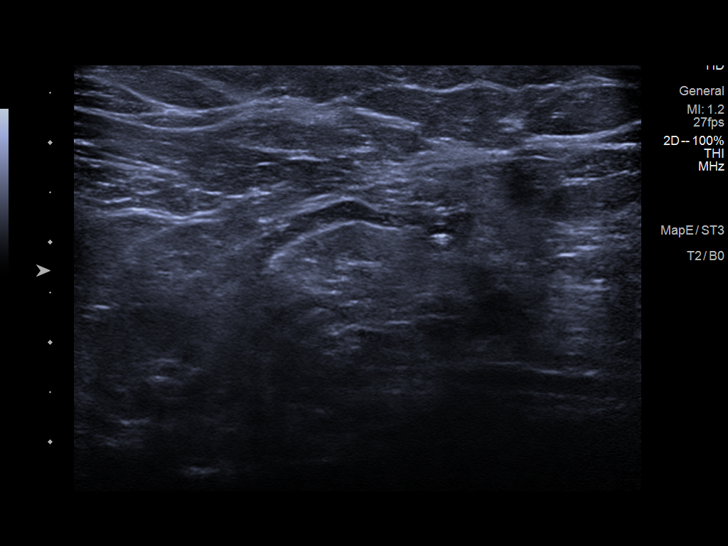
[im 6/11]
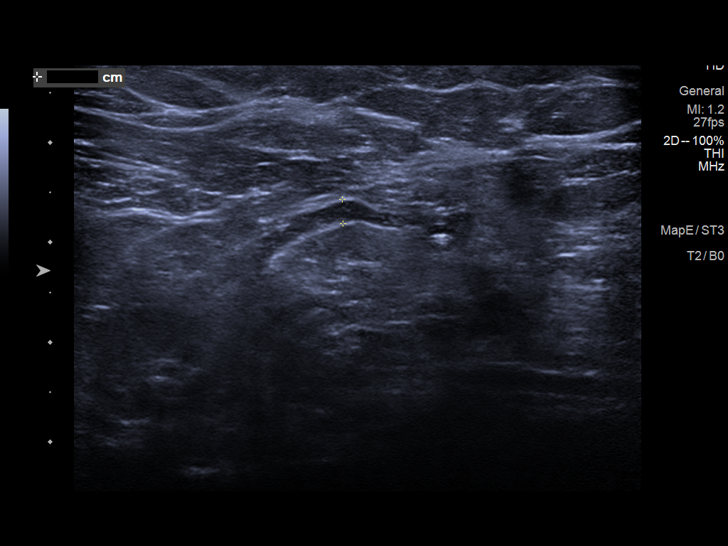
[im 7/11]
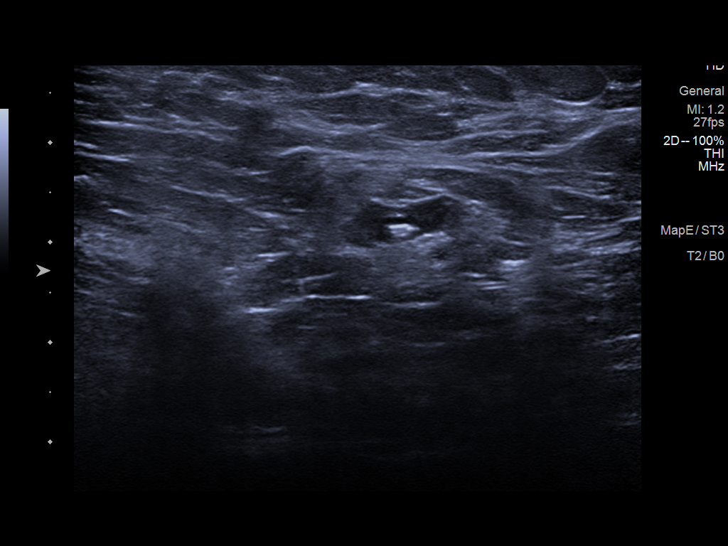
[im 8/11]
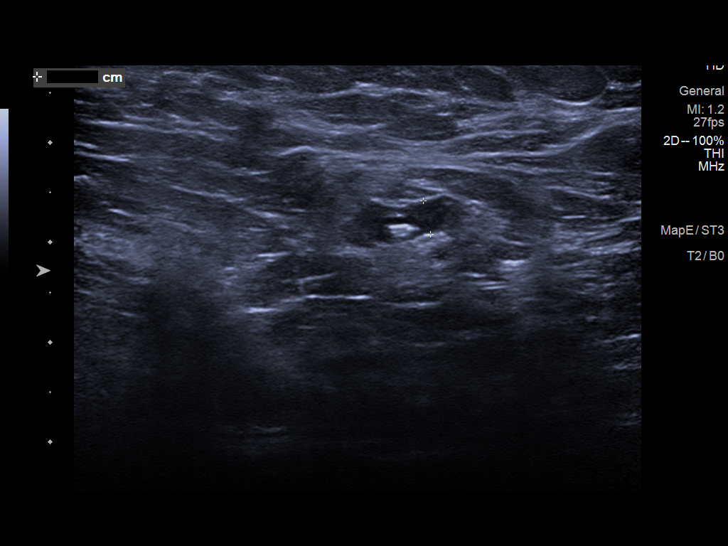
[im 9/11]
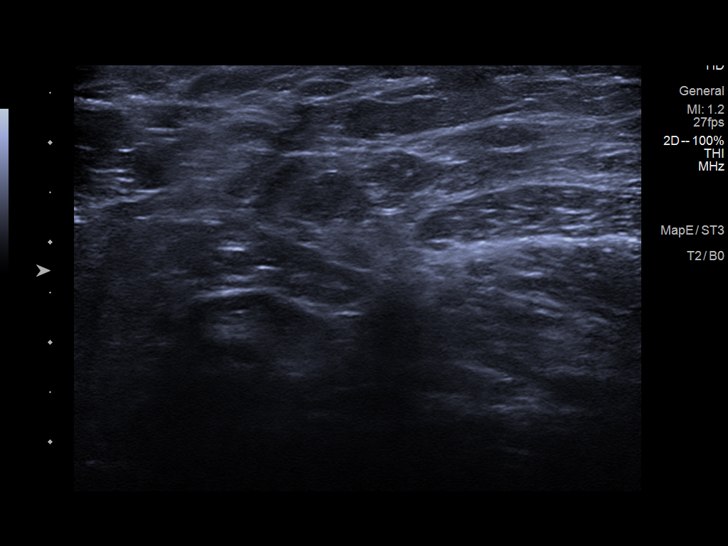
[im 10/11]
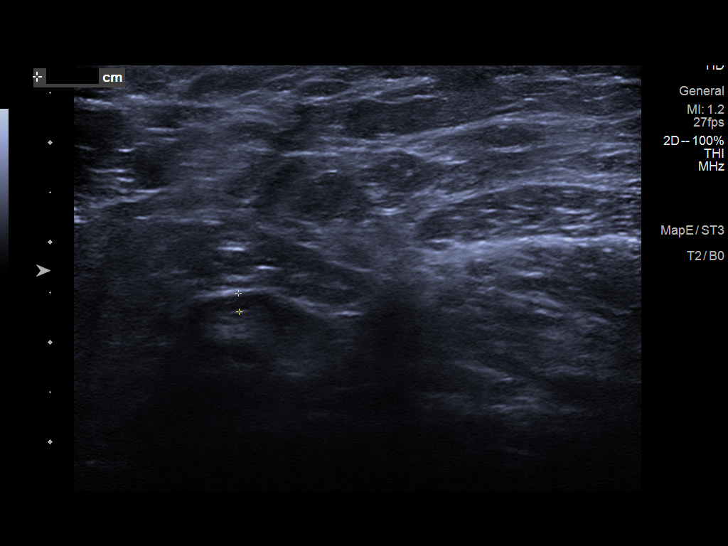
[im 11/11]
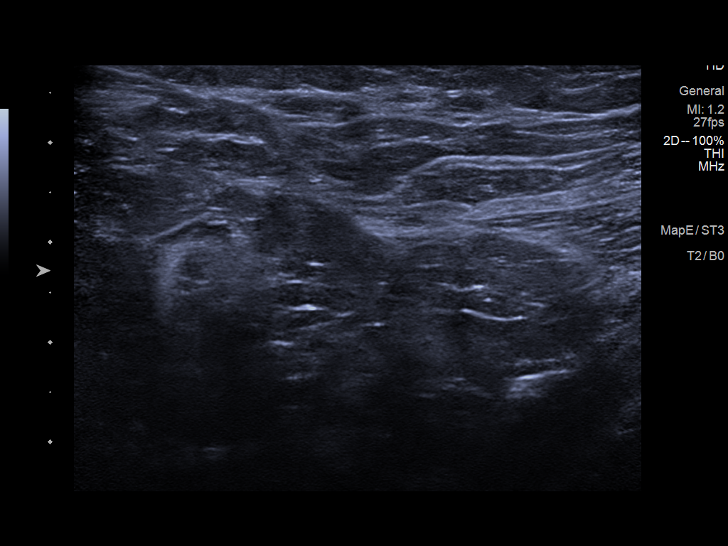

[11 of 11 positions shown; findings below may reference images not displayed]

FINDINGS: On physical exam, no mass palpable in the 12 o'clock position of the
left breast or left axilla. There is some palpable thickening in the
12 o'clock position of the breast, 6 cm from the nipple.

Targeted ultrasound is performed, showing the previously
demonstrated 2.7 x 2.6 x 2.2 cm irregular, hypoechoic mass in the 12
o'clock position of the left breast, 6 cm from the nipple, is
smaller, no longer homogeneously hypoechoic and has less decreased
through transmission of sound. The margins are not well-defined and
the mass currently measures approximately 2.2 x 1.6 x 1.4 cm.

The previously biopsied left axillary lymph node with a maximum
cortical thickness of 4.3 mm is significantly improved in
appearance. The maximum cortical thickness is 3.4 mm and is at the
location of the previously placed biopsy marker clip. The cortex in
the remainder of that lymph node is significantly thinner and normal
in appearance with a maximum thickness of 2.4 mm.

The previously demonstrated additional left axillary lymph node with
a maximum cortical thickness of 3.8 mm is also significantly
improved in appearance and currently has a normal appearance with a
maximum cortical thickness of 1.9 mm.
IMPRESSION: 1. Significant decrease in size and homogeneity of the previously
biopsied malignancy in the 12 o'clock position of the left breast,
as described above.
2. Significantly improved appearance of the previously demonstrated
2 left axillary lymph nodes with cortical thickening, including the
biopsied metastatic lymph node, containing a biopsy marker clip.

RECOMMENDATION:
Treatment plan.

I have discussed the findings and recommendations with the patient.
If applicable, a reminder letter will be sent to the patient
regarding the next appointment.

BI-RADS CATEGORY  6: Known biopsy-proven malignancy.

## 2021-03-14 ENCOUNTER — Other Ambulatory Visit: Payer: Self-pay | Admitting: Family Medicine

## 2021-03-14 ENCOUNTER — Other Ambulatory Visit: Payer: Self-pay

## 2021-03-14 ENCOUNTER — Ambulatory Visit
Admission: RE | Admit: 2021-03-14 | Discharge: 2021-03-14 | Disposition: A | Discharge status: Home / Self Care | Payer: 59 | Source: Ambulatory Visit | Attending: Oncology | Admitting: Oncology

## 2021-03-14 DIAGNOSIS — Z0189 Encounter for other specified special examinations: Secondary | ICD-10-CM | POA: Diagnosis not present

## 2021-03-14 DIAGNOSIS — F419 Anxiety disorder, unspecified: Secondary | ICD-10-CM | POA: Insufficient documentation

## 2021-03-14 DIAGNOSIS — I517 Cardiomegaly: Secondary | ICD-10-CM | POA: Insufficient documentation

## 2021-03-14 DIAGNOSIS — C50919 Malignant neoplasm of unspecified site of unspecified female breast: Secondary | ICD-10-CM | POA: Diagnosis not present

## 2021-03-14 LAB — ECHOCARDIOGRAM COMPLETE: S' Lateral: 2.82 cm

## 2021-03-14 NOTE — Progress Notes (Signed)
*  PRELIMINARY RESULTS* Echocardiogram 2D Echocardiogram has been performed.  Vickie Mathis 03/14/2021, 12:01 PM

## 2021-03-18 ENCOUNTER — Inpatient Hospital Stay: Payer: 59 | Attending: Oncology

## 2021-03-18 ENCOUNTER — Inpatient Hospital Stay: Payer: 59

## 2021-03-18 ENCOUNTER — Encounter: Payer: Self-pay | Admitting: Oncology

## 2021-03-18 ENCOUNTER — Other Ambulatory Visit: Payer: Self-pay

## 2021-03-18 ENCOUNTER — Inpatient Hospital Stay (HOSPITAL_BASED_OUTPATIENT_CLINIC_OR_DEPARTMENT_OTHER): Payer: 59 | Admitting: Oncology

## 2021-03-18 VITALS — BP 103/70 | HR 105 | Temp 98.7°F | Resp 18 | Ht 69.0 in | Wt 196.0 lb

## 2021-03-18 DIAGNOSIS — Z5111 Encounter for antineoplastic chemotherapy: Secondary | ICD-10-CM

## 2021-03-18 DIAGNOSIS — C50812 Malignant neoplasm of overlapping sites of left female breast: Secondary | ICD-10-CM | POA: Diagnosis present

## 2021-03-18 DIAGNOSIS — Z5112 Encounter for antineoplastic immunotherapy: Secondary | ICD-10-CM | POA: Insufficient documentation

## 2021-03-18 DIAGNOSIS — Z171 Estrogen receptor negative status [ER-]: Secondary | ICD-10-CM

## 2021-03-18 DIAGNOSIS — Z79899 Other long term (current) drug therapy: Secondary | ICD-10-CM | POA: Insufficient documentation

## 2021-03-18 DIAGNOSIS — C50412 Malignant neoplasm of upper-outer quadrant of left female breast: Secondary | ICD-10-CM

## 2021-03-18 DIAGNOSIS — C773 Secondary and unspecified malignant neoplasm of axilla and upper limb lymph nodes: Secondary | ICD-10-CM | POA: Insufficient documentation

## 2021-03-18 DIAGNOSIS — C50919 Malignant neoplasm of unspecified site of unspecified female breast: Secondary | ICD-10-CM

## 2021-03-18 LAB — COMPREHENSIVE METABOLIC PANEL
ALT: 34 U/L (ref 0–44)
AST: 27 U/L (ref 15–41)
Albumin: 4 g/dL (ref 3.5–5.0)
Alkaline Phosphatase: 66 U/L (ref 38–126)
Anion gap: 10 (ref 5–15)
BUN: 11 mg/dL (ref 6–20)
CO2: 25 mmol/L (ref 22–32)
Calcium: 9.3 mg/dL (ref 8.9–10.3)
Chloride: 105 mmol/L (ref 98–111)
Creatinine, Ser: 0.76 mg/dL (ref 0.44–1.00)
GFR, Estimated: >60 mL/min (ref >60)
Glucose, Bld: 103 mg/dL — ABNORMAL HIGH (ref 70–99)
Potassium: 3.8 mmol/L (ref 3.5–5.1)
Sodium: 140 mmol/L (ref 135–145)
Total Bilirubin: 0.6 mg/dL (ref 0.3–1.2)
Total Protein: 6.6 g/dL (ref 6.5–8.1)

## 2021-03-18 LAB — CBC WITH DIFFERENTIAL/PLATELET
Abs Immature Granulocytes: 0.02 10*3/uL (ref 0.00–0.07)
Basophils Absolute: 0 10*3/uL (ref 0.0–0.1)
Basophils Relative: 1 %
Eosinophils Absolute: 0.1 10*3/uL (ref 0.0–0.5)
Eosinophils Relative: 3 %
HCT: 32 % — ABNORMAL LOW (ref 36.0–46.0)
Hemoglobin: 11.4 g/dL — ABNORMAL LOW (ref 12.0–15.0)
Immature Granulocytes: 0 %
Lymphocytes Relative: 30 %
Lymphs Abs: 1.4 10*3/uL (ref 0.7–4.0)
MCH: 35.2 pg — ABNORMAL HIGH (ref 26.0–34.0)
MCHC: 35.6 g/dL (ref 30.0–36.0)
MCV: 98.8 fL (ref 80.0–100.0)
Monocytes Absolute: 0.5 10*3/uL (ref 0.1–1.0)
Monocytes Relative: 11 %
Neutro Abs: 2.6 10*3/uL (ref 1.7–7.7)
Neutrophils Relative %: 55 %
Platelets: 234 10*3/uL (ref 150–400)
RBC: 3.24 MIL/uL — ABNORMAL LOW (ref 3.87–5.11)
RDW: 17.3 % — ABNORMAL HIGH (ref 11.5–15.5)
WBC: 4.6 10*3/uL (ref 4.0–10.5)
nRBC: 0 % (ref 0.0–0.2)

## 2021-03-18 MED ORDER — SODIUM CHLORIDE 0.9 % IV SOLN
600.0000 mg/m2 | Freq: Once | INTRAVENOUS | Status: AC
  Administered 2021-03-18: 1240 mg via INTRAVENOUS
  Filled 2021-03-18: qty 50

## 2021-03-18 MED ORDER — PEGFILGRASTIM 6 MG/0.6ML ~~LOC~~ PSKT
6.0000 mg | PREFILLED_SYRINGE | Freq: Once | SUBCUTANEOUS | Status: AC
  Administered 2021-03-18: 6 mg via SUBCUTANEOUS
  Filled 2021-03-18: qty 0.6

## 2021-03-18 MED ORDER — SODIUM CHLORIDE 0.9% FLUSH
10.0000 mL | INTRAVENOUS | Status: DC | PRN
  Administered 2021-03-18: 10 mL via INTRAVENOUS
  Filled 2021-03-18: qty 10

## 2021-03-18 MED ORDER — HEPARIN SOD (PORK) LOCK FLUSH 100 UNIT/ML IV SOLN
INTRAVENOUS | Status: AC
  Filled 2021-03-18: qty 5

## 2021-03-18 MED ORDER — HEPARIN SOD (PORK) LOCK FLUSH 100 UNIT/ML IV SOLN
500.0000 [IU] | Freq: Once | INTRAVENOUS | Status: AC
Start: 2021-03-18 — End: 2021-03-18
  Administered 2021-03-18: 500 [IU] via INTRAVENOUS
  Filled 2021-03-18: qty 5

## 2021-03-18 MED ORDER — LEUPROLIDE ACETATE (3 MONTH) 22.5 MG IM KIT
11.2500 mg | PACK | Freq: Once | INTRAMUSCULAR | Status: AC
  Administered 2021-03-18: 11.25 mg via INTRAMUSCULAR
  Filled 2021-03-18: qty 22.5

## 2021-03-18 MED ORDER — ONDANSETRON HCL 8 MG PO TABS: 8.0000 mg | ORAL_TABLET | Freq: Two times a day (BID) | ORAL | 1 refills | Status: DC | PRN

## 2021-03-18 MED ORDER — PALONOSETRON HCL INJECTION 0.25 MG/5ML
0.2500 mg | Freq: Once | INTRAVENOUS | Status: AC
  Administered 2021-03-18: 0.25 mg via INTRAVENOUS
  Filled 2021-03-18: qty 5

## 2021-03-18 MED ORDER — SODIUM CHLORIDE 0.9 % IV SOLN
10.0000 mg | Freq: Once | INTRAVENOUS | Status: AC
  Administered 2021-03-18: 10 mg via INTRAVENOUS
  Filled 2021-03-18: qty 10

## 2021-03-18 MED ORDER — PEMBROLIZUMAB CHEMO INJECTION 100 MG/4ML
200.0000 mg | Freq: Once | INTRAVENOUS | Status: AC
  Administered 2021-03-18: 200 mg via INTRAVENOUS
  Filled 2021-03-18: qty 8

## 2021-03-18 MED ORDER — FOSAPREPITANT DIMEGLUMINE INJECTION 150 MG
150.0000 mg | Freq: Once | INTRAVENOUS | Status: AC
  Administered 2021-03-18: 150 mg via INTRAVENOUS
  Filled 2021-03-18: qty 150

## 2021-03-18 MED ORDER — DOXORUBICIN HCL CHEMO IV INJECTION 2 MG/ML
60.0000 mg/m2 | Freq: Once | INTRAVENOUS | Status: AC
  Administered 2021-03-18: 124 mg via INTRAVENOUS
  Filled 2021-03-18: qty 50

## 2021-03-18 MED ORDER — SODIUM CHLORIDE 0.9 % IV SOLN
Freq: Once | INTRAVENOUS | Status: AC
  Filled 2021-03-18: qty 250

## 2021-03-18 NOTE — Progress Notes (Signed)
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  She sought medical attention recently after thinking it was a possible cyst for all this while.She underwent a diagnostic bilateral mammogram and ultrasound which showed a 2.7 cm mass at the 12 o'clock position 6 cm from the nipple.  Ultrasound of the left axilla demonstrates 2 lymph nodes with mild thickened cortices of 4 mm.  There is skin thickening on the lower inner quadrant of the left breast on mammography.  Both the breast mass and the lymph node were biopsied and was positive for invasive mammary carcinoma grade 3.  Lymph node was also suspicious for extracapsular extension.    ER/PR and HER-2 negative   MRI showed irregular enhancing mass at the 12 o'clock position of the left breast measuring 2.5 x 2.3 cm and a linear component extending 2.3 cm anteriorly.  The mass in the anterior linear extension combined measure 4.3 cm.  3.9 cm in the cephalocaudal dimension.  Also an area of clumped non-mass enhancement in the posterior aspect of the lower quadrant of the left breast measuring 2.6 x 0.9 cm which looks suspicious.  2 enlarged left axillary lymph nodes and 2 mildly enlarged internal mammary lymph nodes.  4.3 cm clumped area of non-mass enhancement in the outer quadrant of the right breast.  3 right axillary  lymph nodes with mild cortical thickening.   Patient underwent biopsy of the right breast non-mass enhancement and that was negative for malignancy   CT scan showed mildly enlarged right inguinal lymph nodes nonspecific.  Left upper breast mass with prominent left axillary lymph nodes.  Subcentimeter pulmonary nodules which are calcified and compatible with benign old granulomatous disease.  0.6 5.4 cm lucent lesion in the left first rib possibly a hemangioma or benign lesion.   Patient developed significant infusion reaction to carboplatin with dose 9 when her blood pressure dropped and she became tachycardic and significantly nauseous.  Interval history-patient reports ongoing fatigue.  She does have some tingling numbness in her right hand which is not significantly worse as compared to before.  Denies any significant neuropathy in her feet.  ECOG PS- 1 Pain scale- 0   Review of systems- Review of Systems  Constitutional:  Positive for malaise/fatigue.  Neurological:  Positive for sensory change (Peripheral neuropathy).     Allergies  Allergen Reactions   Carboplatin Shortness Of Breath, Nausea And Vomiting and Other (See Comments)    Flushing- chest , face, neck , arm including hand   Amoxicillin     Other reaction(s): "too young to remember what they do to me"   Sulfa Antibiotics     Other reaction(s): "too young to remember what they do to me"     Past Medical History:  Diagnosis Date   Anxiety    Asthma    Breast cancer (HCC) 11/2020   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 breast  ca   Depression    Family history of cancer    Varicose veins of bilateral lower extremities with pain      Past Surgical History:  Procedure Laterality Date   APPENDECTOMY  2018   BREAST BIOPSY Left 11/26/2020   vision 12:00 6cmfn [facility]   BREAST BIOPSY Left 11/26/2020   LN bx, hydro marker,  fragments of macrometastatic carcinoma   PORTACATH PLACEMENT Right 12/13/2020   Procedure: INSERTION PORT-A-CATH;   Surgeon: Herbert Pun, MD;  Location: [facility] ORS;  Service: General;  Laterality: Right;    Social History   Socioeconomic History   Marital status: Married    Spouse name: Not on file   Number of children: Not on file   Years of education: Not on file   Highest education level: Not on file  Occupational History   Not on file  Tobacco Use   Smoking status: Never   Smokeless tobacco: Never  Vaping Use   Vaping Use: Never used  Substance and Sexual Activity   Alcohol use: Yes    Comment: occassinally    Drug use: Yes    Types: Marijuana   Sexual activity: Yes  Other Topics Concern   Not on file  Social History Narrative   Not on file   Social Determinants of Health   Financial Resource Strain: Not on file  Food Insecurity: Not on file  Transportation Needs: Not on file  Physical Activity: Not on file  Stress: Not on file  Social Connections: Not on file  Intimate Partner Violence: Not on file    Family History  Problem Relation Age of Onset   Diabetes Father    Varicose Veins Father    Diabetes Paternal Aunt    Uterine cancer Paternal Aunt        precancerous   Diabetes Paternal Grandmother    Uterine cancer Paternal Grandmother        precancerous   Thyroid disease Mother    Thyroid disease Maternal Aunt    Thyroid disease Maternal Grandmother    Cancer Other        stomach vs ovarian/cervical   Cancer Paternal Great-grandmother        unk     Current Outpatient Medications:    acetaminophen (TYLENOL) 325 MG tablet, , Disp: , Rfl:    albuterol (VENTOLIN HFA) 108 (90 Base) MCG/ACT inhaler, Inhale 1-2 puffs into the lungs every 6 (six) hours as needed for shortness of breath or wheezing., Disp: , Rfl:    Ascorbic Acid (SUPER C COMPLEX PO), Take 1 capsule by mouth daily., Disp: , Rfl:    busPIRone (BUSPAR) 15 MG tablet, Take 15 mg by mouth 2 (two) times daily., Disp: , Rfl:    citalopram (CELEXA) 40 MG tablet, Take 40 mg by mouth daily., Disp: ,  Rfl:    clonazePAM (KLONOPIN) 0.5 MG tablet, Take 1 tablet (0.5 mg total) by mouth 3 (three) times daily as needed., Disp: 60 tablet, Rfl: 0   dexamethasone (DECADRON) 4 MG tablet, Take 2 tablets (8 mg total) by mouth daily. Start the day after chemotherapy for 2 days., Disp: 30 tablet, Rfl: 1   folic acid (FOLVITE) 1 MG tablet, Take 2 tablets (2 mg total) by mouth daily., Disp: 60 tablet, Rfl: 2   ibuprofen (ADVIL) 200 MG tablet, , Disp: , Rfl:    lidocaine-prilocaine (EMLA) cream, Apply to affected area once, Disp: 30 g, Rfl: 3   LORazepam (ATIVAN) 0.5 MG tablet, TAKE 1  TABLET (0.5 MG TOTAL) BY MOUTH EVERY 6 (SIX) HOURS AS NEEDED (NAUSEA OR VOMITING)., Disp: 30 tablet, Rfl: 0   triamcinolone ointment (KENALOG) 0.5 %, Apply 1 application topically 2 (two) times daily., Disp: 30 g, Rfl: 0   norethindrone-ethinyl estradiol (LOESTRIN FE) 1-20 MG-MCG tablet, Take 1 tablet by mouth daily. (Patient not taking: No sig reported), Disp: , Rfl:    ondansetron (ZOFRAN) 8 MG tablet, Take 1 tablet (8 mg total) by mouth 2 (two) times daily as needed for refractory nausea / vomiting. Start on day 3 after chemo., Disp: 30 tablet, Rfl: 1   prochlorperazine (COMPAZINE) 10 MG tablet, Take 1 tablet (10 mg total) by mouth every 6 (six) hours as needed (Nausea or vomiting). (Patient not taking: No sig reported), Disp: 30 tablet, Rfl: 1 No current facility-administered medications for this visit.  Facility-Administered Medications Ordered in Other Visits:    cyclophosphamide (CYTOXAN) 1,240 mg in sodium chloride 0.9 % 250 mL chemo infusion, 600 mg/m2 (Treatment Plan Recorded), Intravenous, Once, Sindy Guadeloupe, MD, Last Rate: 624 mL/hr at 03/18/21 1135, 1,240 mg at 03/18/21 1135   heparin lock flush 100 unit/mL, 500 Units, Intravenous, Once, Sindy Guadeloupe, MD   leuprolide (LUPRON) injection 11.25 mg, 11.25 mg, Intramuscular, Once, Sindy Guadeloupe, MD   pegfilgrastim (NEULASTA ONPRO KIT) injection 6 mg, 6 mg,  Subcutaneous, Once, Sindy Guadeloupe, MD   sodium chloride flush (NS) 0.9 % injection 10 mL, 10 mL, Intravenous, PRN, Sindy Guadeloupe, MD, 10 mL at 03/18/21 0823  Physical exam:  Vitals:   03/18/21 0833  BP: 103/70  Pulse: (!) 105  Resp: 18  Temp: 98.7 F (37.1 C)  SpO2: 99%  Weight: 196 lb (88.9 kg)  Height: $Remove'5\' 9"'[id number]$  (1.753 m)   Physical Exam Constitutional:      General: She is not in acute distress. Cardiovascular:     Rate and Rhythm: Normal rate and regular rhythm.     Heart sounds: Normal heart sounds.  Pulmonary:     Effort: Pulmonary effort is normal.  Skin:    General: Skin is warm and dry.  Neurological:     Mental Status: She is alert and oriented to person, place, and time.     CMP Latest Ref Rng & Units 03/18/2021  Glucose 70 - 99 mg/dL 103(H)  BUN 6 - 20 mg/dL 11  Creatinine 0.44 - 1.00 mg/dL 0.76  Sodium 135 - 145 mmol/L 140  Potassium 3.5 - 5.1 mmol/L 3.8  Chloride 98 - 111 mmol/L 105  CO2 22 - 32 mmol/L 25  Calcium 8.9 - 10.3 mg/dL 9.3  Total Protein 6.5 - 8.1 g/dL 6.6  Total Bilirubin 0.3 - 1.2 mg/dL 0.6  Alkaline Phos 38 - 126 U/L 66  AST 15 - 41 U/L 27  ALT 0 - 44 U/L 34   CBC Latest Ref Rng & Units 03/18/2021  WBC 4.0 - 10.5 K/uL 4.6  Hemoglobin 12.0 - 15.0 g/dL 11.4(L)  Hematocrit 36.0 - 46.0 % 32.0(L)  Platelets 150 - 400 K/uL 234    No images are attached to the encounter.  US Breast Limited Uni Left Inc Axilla  Result Date: 03/06/2021 CLINICAL DATA:  Follow-up neoadjuvant chemotherapy for left breast triple negative invasive mammary carcinoma and metastatic left axillary lymph node. EXAM: ULTRASOUND OF THE LEFT BREAST COMPARISON:  Left breast ultrasound dated 11/12/2020. FINDINGS: On physical exam, no mass palpable in the 12 o'clock position of the left breast or left axilla. There is some  palpable thickening in the 12 o'clock position of the breast, 6 cm from the nipple. Targeted ultrasound is performed, showing the previously demonstrated 2.7 x  2.6 x 2.2 cm irregular, hypoechoic mass in the 12 o'clock position of the left breast, 6 cm from the nipple, is smaller, no longer homogeneously hypoechoic and has less decreased through transmission of sound. The margins are not well-defined and the mass currently measures approximately 2.2 x 1.6 x 1.4 cm. The previously biopsied left axillary lymph node with a maximum cortical thickness of 4.3 mm is significantly improved in appearance. The maximum cortical thickness is 3.4 mm and is at the location of the previously placed biopsy marker clip. The cortex in the remainder of that lymph node is significantly thinner and normal in appearance with a maximum thickness of 2.4 mm. The previously demonstrated additional left axillary lymph node with a maximum cortical thickness of 3.8 mm is also significantly improved in appearance and currently has a normal appearance with a maximum cortical thickness of 1.9 mm. IMPRESSION: 1. Significant decrease in size and homogeneity of the previously biopsied malignancy in the 12 o'clock position of the left breast, as described above. 2. Significantly improved appearance of the previously demonstrated 2 left axillary lymph nodes with cortical thickening, including the biopsied metastatic lymph node, containing a biopsy marker clip. RECOMMENDATION: Treatment plan. I have discussed the findings and recommendations with the patient. If applicable, a reminder letter will be sent to the patient regarding the next appointment. BI-RADS CATEGORY  6: Known biopsy-proven malignancy. Electronically Signed   By: Claudie Revering M.D.   On: 03/06/2021 16:31   ECHOCARDIOGRAM COMPLETE  Result Date: 03/14/2021    ECHOCARDIOGRAM REPORT   Patient Name:   SHARLEE RUFINO Date of Exam: 03/14/2021 Medical Rec #:  [id number]      Height:       69.0 in Accession #:    [id number]     Weight:       195.6 lb Date of Birth:  1991/04/14      BSA:          2.047 m Patient Age:    30 years       BP:            132/72 mmHg Patient Gender: F              HR:           97 bpm. Exam Location:  [facility] Procedure: 2D Echo, Cardiac Doppler and Color Doppler Indications:     Chemo Z09  History:         Patient has no prior history of Echocardiogram examinations.                  Anxiety, breast cancer.  Sonographer:     Sherrie Sport RDCS (AE) Referring Phys:  [id number] Weston Anna Sarena Jezek Diagnosing Phys: Harrell Gave End MD  Sonographer Comments: Technically challenging study due to limited acoustic windows, no apical window and suboptimal subcostal window. IMPRESSIONS  1. Left ventricular ejection fraction, by estimation, is 65 to 70%. The left ventricle has normal function. Left ventricular endocardial border not optimally defined to evaluate regional wall motion. There is mild left ventricular hypertrophy. Left ventricular diastolic function could not be evaluated.  2. Right ventricular systolic function is normal. The right ventricular size is normal. Tricuspid regurgitation signal is inadequate for assessing PA pressure.  3. The mitral valve is normal in structure. No evidence of mitral valve regurgitation.  4.  The aortic valve is grossly normal. Aortic valve regurgitation is not visualized. Aortic valve gradient not well assessed, though visually there does not appear to be any significant stenosis. FINDINGS  Left Ventricle: Left ventricular ejection fraction, by estimation, is 65 to 70%. The left ventricle has normal function. Left ventricular endocardial border not optimally defined to evaluate regional wall motion. The left ventricular internal cavity size was normal in size. There is mild left ventricular hypertrophy. Left ventricular diastolic function could not be evaluated. Right Ventricle: The right ventricular size is normal. No increase in right ventricular wall thickness. Right ventricular systolic function is normal. Tricuspid regurgitation signal is inadequate for assessing PA pressure. Left Atrium: Left atrial size was not  well visualized. Right Atrium: Right atrial size was not well visualized. Pericardium: There is no evidence of pericardial effusion. Mitral Valve: The mitral valve is normal in structure. No evidence of mitral valve regurgitation. Tricuspid Valve: The tricuspid valve is not well visualized. Tricuspid valve regurgitation is not demonstrated. Aortic Valve: The aortic valve is grossly normal. Aortic valve regurgitation is not visualized. Aortic valve gradient not well assessed, though visually there does not appear to be any significant stenosis. Pulmonic Valve: The pulmonic valve was not well visualized. Pulmonic valve regurgitation is not visualized. No evidence of pulmonic stenosis. Aorta: The aortic root is normal in size and structure. Pulmonary Artery: The pulmonary artery is not well seen. Venous: The inferior vena cava was not well visualized. IAS/Shunts: The interatrial septum was not well visualized.  LEFT VENTRICLE PLAX 2D LVIDd:         3.97 cm LVIDs:         2.82 cm LV PW:         0.95 cm LV IVS:        1.09 cm LVOT diam:     2.00 cm LVOT Area:     3.14 cm  LEFT ATRIUM           Index      RIGHT ATRIUM          Index LA diam:      2.70 cm 1.32 cm/m RA Area:     7.73 cm LA Vol (A4C): 19.9 ml 9.72 ml/m RA Volume:   13.70 ml 6.69 ml/m                        PULMONIC VALVE AORTA                 PV Vmax:        0.96 m/s Ao Root diam: 2.90 cm PV Peak grad:   3.7 mmHg                       RVOT Peak grad: 5 mmHg   SHUNTS Systemic Diam: 2.00 cm Nelva Bush MD Electronically signed by Nelva Bush MD Signature Date/Time: 03/14/2021/4:24:40 PM    Final      Assessment and plan- Patient is a 31 y.o. female with stage IIIb triple negative breast cancer of the left breast cT2 N1 M0.  She is s/p CarboTaxol chemotherapy with Keytruda neoadjuvant and here for on treatment assessment prior to cycle 1 of AC Keytruda chemotherapy  Counts okay to proceed with [facility] Keytruda chemotherapy today.  She will be  receiving this every 3 weeks as per keynote 522 regimen for 4 weeks.  Baseline echocardiogram was normal.  Discussed risks and benefits of chemotherapy including all but  not limited to nausea, vomiting, low blood counts, risk of infections and hospitalizations and cardiotoxicity associated with anthracycline.  Patient understands and agrees to proceed as planned.  I will see her back in 3 weeks for cycle 2.  Discussed results of interim ultrasound after 12 cycles of CarboTaxol chemotherapy which showed continued response to treatment with reduction in the size of the left breast mass as well as axillary lymph nodes.  Chemo induced peripheral neuropathy: Discussed medication management with gabapentin or switching her from Celexa to duloxetine versus acupuncture.  Patient would like to start off with acupuncture at this time and we will make the referral.   Visit Diagnosis 1. Encounter for antineoplastic chemotherapy   2. Malignant neoplasm of upper-outer quadrant of left breast in female, estrogen receptor negative ([employer])   3. Encounter for antineoplastic immunotherapy      Dr. Randa Evens, MD, MPH [facility] [phone] 03/18/2021 11:42 AM

## 2021-03-18 NOTE — Patient Instructions (Signed)
[facility]  Discharge Instructions: Thank you for choosing [facility] to provide your oncology and hematology care.  If you have a lab appointment with the [facility], please go directly to the [facility] and check in at the registration area.  Wear comfortable clothing and clothing appropriate for easy access to any Portacath or PICC line.   We strive to give you quality time with your provider. You may need to reschedule your appointment if you arrive late (15 or more minutes).  Arriving late affects you and other patients whose appointments are after yours.  Also, if you miss three or more appointments without notifying the office, you may be dismissed from the clinic at the provider's discretion.      For prescription refill requests, have your pharmacy contact our office and allow 72 hours for refills to be completed.    Today you received the following chemotherapy and/or immunotherapy agents : Keytruda / Adriamycin / Cytoxan   To help prevent nausea and vomiting after your treatment, we encourage you to take your nausea medication as directed.  BELOW ARE SYMPTOMS THAT SHOULD BE REPORTED IMMEDIATELY: *FEVER GREATER THAN 100.4 F (38 C) OR HIGHER *CHILLS OR SWEATING *NAUSEA AND VOMITING THAT IS NOT CONTROLLED WITH YOUR NAUSEA MEDICATION *UNUSUAL SHORTNESS OF BREATH *UNUSUAL BRUISING OR BLEEDING *URINARY PROBLEMS (pain or burning when urinating, or frequent urination) *BOWEL PROBLEMS (unusual diarrhea, constipation, pain near the anus) TENDERNESS IN MOUTH AND THROAT WITH OR WITHOUT PRESENCE OF ULCERS (sore throat, sores in mouth, or a toothache) UNUSUAL RASH, SWELLING OR PAIN  UNUSUAL VAGINAL DISCHARGE OR ITCHING   Items with * indicate a potential emergency and should be followed up as soon as possible or go to the Emergency Department if any problems should occur.  Please show the CHEMOTHERAPY ALERT CARD or IMMUNOTHERAPY  ALERT CARD at check-in to the Emergency Department and triage nurse.  Should you have questions after your visit or need to cancel or reschedule your appointment, please contact [facility]  [phone] and follow the prompts.  Office hours are 8:00 a.m. to 4:30 p.m. Monday - Friday. Please note that voicemails left after 4:00 p.m. may not be returned until the following business day.  We are closed weekends and major holidays. You have access to a nurse at all times for urgent questions. Please call the main number to the clinic [phone] and follow the prompts.  For any non-urgent questions, you may also contact your provider using MyChart. We now offer e-Visits for anyone 49 and older to request care online for non-urgent symptoms. For details visit [email or web address].   Also download the MyChart app! Go to the app store, search "MyChart", open the app, select [facility], and log in with your MyChart username and password.  Due to Covid, a mask is required upon entering the hospital/clinic. If you do not have a mask, one will be given to you upon arrival. For doctor visits, patients may have 1 support person aged 52 or older with them. For treatment visits, patients cannot have anyone with them due to current Covid guidelines and our immunocompromised population.

## 2021-04-04 ENCOUNTER — Other Ambulatory Visit: Payer: Self-pay | Admitting: Oncology

## 2021-04-04 MED ORDER — CITALOPRAM HYDROBROMIDE 40 MG PO TABS: 40.0000 mg | ORAL_TABLET | Freq: Every day | ORAL | 2 refills | Status: DC

## 2021-04-08 ENCOUNTER — Other Ambulatory Visit: Payer: Self-pay

## 2021-04-08 ENCOUNTER — Inpatient Hospital Stay: Payer: 59

## 2021-04-08 ENCOUNTER — Inpatient Hospital Stay (HOSPITAL_BASED_OUTPATIENT_CLINIC_OR_DEPARTMENT_OTHER): Payer: 59 | Admitting: Oncology

## 2021-04-08 ENCOUNTER — Inpatient Hospital Stay: Payer: 59 | Attending: Oncology

## 2021-04-08 VITALS — HR 81

## 2021-04-08 VITALS — BP 120/72 | HR 104 | Temp 97.8°F | Resp 18 | Wt 197.0 lb

## 2021-04-08 DIAGNOSIS — G620 Drug-induced polyneuropathy: Secondary | ICD-10-CM

## 2021-04-08 DIAGNOSIS — C50412 Malignant neoplasm of upper-outer quadrant of left female breast: Secondary | ICD-10-CM | POA: Diagnosis not present

## 2021-04-08 DIAGNOSIS — Z5111 Encounter for antineoplastic chemotherapy: Secondary | ICD-10-CM

## 2021-04-08 DIAGNOSIS — Z5112 Encounter for antineoplastic immunotherapy: Secondary | ICD-10-CM | POA: Insufficient documentation

## 2021-04-08 DIAGNOSIS — C50812 Malignant neoplasm of overlapping sites of left female breast: Secondary | ICD-10-CM | POA: Diagnosis present

## 2021-04-08 DIAGNOSIS — Z79899 Other long term (current) drug therapy: Secondary | ICD-10-CM | POA: Diagnosis not present

## 2021-04-08 DIAGNOSIS — E538 Deficiency of other specified B group vitamins: Secondary | ICD-10-CM | POA: Diagnosis not present

## 2021-04-08 DIAGNOSIS — Z171 Estrogen receptor negative status [ER-]: Secondary | ICD-10-CM

## 2021-04-08 DIAGNOSIS — C50919 Malignant neoplasm of unspecified site of unspecified female breast: Secondary | ICD-10-CM

## 2021-04-08 DIAGNOSIS — R110 Nausea: Secondary | ICD-10-CM

## 2021-04-08 DIAGNOSIS — T451X5A Adverse effect of antineoplastic and immunosuppressive drugs, initial encounter: Secondary | ICD-10-CM

## 2021-04-08 LAB — COMPREHENSIVE METABOLIC PANEL
ALT: 27 U/L (ref 0–44)
AST: 22 U/L (ref 15–41)
Albumin: 4.1 g/dL (ref 3.5–5.0)
Alkaline Phosphatase: 78 U/L (ref 38–126)
Anion gap: 8 (ref 5–15)
BUN: 11 mg/dL (ref 6–20)
CO2: 27 mmol/L (ref 22–32)
Calcium: 9.3 mg/dL (ref 8.9–10.3)
Chloride: 101 mmol/L (ref 98–111)
Creatinine, Ser: 0.57 mg/dL (ref 0.44–1.00)
GFR, Estimated: >60 mL/min (ref >60)
Glucose, Bld: 96 mg/dL (ref 70–99)
Potassium: 4.1 mmol/L (ref 3.5–5.1)
Sodium: 136 mmol/L (ref 135–145)
Total Bilirubin: 0.5 mg/dL (ref 0.3–1.2)
Total Protein: 7.1 g/dL (ref 6.5–8.1)

## 2021-04-08 LAB — CBC WITH DIFFERENTIAL/PLATELET
Abs Immature Granulocytes: 0.11 10*3/uL — ABNORMAL HIGH (ref 0.00–0.07)
Basophils Absolute: 0.2 10*3/uL — ABNORMAL HIGH (ref 0.0–0.1)
Basophils Relative: 3 %
Eosinophils Absolute: 0 10*3/uL (ref 0.0–0.5)
Eosinophils Relative: 0 %
HCT: 30.7 % — ABNORMAL LOW (ref 36.0–46.0)
Hemoglobin: 11 g/dL — ABNORMAL LOW (ref 12.0–15.0)
Immature Granulocytes: 2 %
Lymphocytes Relative: 22 %
Lymphs Abs: 1.3 10*3/uL (ref 0.7–4.0)
MCH: 36.5 pg — ABNORMAL HIGH (ref 26.0–34.0)
MCHC: 35.8 g/dL (ref 30.0–36.0)
MCV: 102 fL — ABNORMAL HIGH (ref 80.0–100.0)
Monocytes Absolute: 1 10*3/uL (ref 0.1–1.0)
Monocytes Relative: 17 %
Neutro Abs: 3.5 10*3/uL (ref 1.7–7.7)
Neutrophils Relative %: 56 %
Platelets: 350 10*3/uL (ref 150–400)
RBC: 3.01 MIL/uL — ABNORMAL LOW (ref 3.87–5.11)
RDW: 14.6 % (ref 11.5–15.5)
WBC: 6.1 10*3/uL (ref 4.0–10.5)
nRBC: 0 % (ref 0.0–0.2)

## 2021-04-08 MED ORDER — PALONOSETRON HCL INJECTION 0.25 MG/5ML
0.2500 mg | Freq: Once | INTRAVENOUS | Status: AC
Start: 2021-04-08 — End: 2021-04-08
  Administered 2021-04-08: 0.25 mg via INTRAVENOUS
  Filled 2021-04-08: qty 5

## 2021-04-08 MED ORDER — HEPARIN SOD (PORK) LOCK FLUSH 100 UNIT/ML IV SOLN
INTRAVENOUS | Status: AC
  Filled 2021-04-08: qty 5

## 2021-04-08 MED ORDER — SODIUM CHLORIDE 0.9 % IV SOLN
600.0000 mg/m2 | Freq: Once | INTRAVENOUS | Status: AC
  Administered 2021-04-08: 1240 mg via INTRAVENOUS
  Filled 2021-04-08: qty 25

## 2021-04-08 MED ORDER — DOXORUBICIN HCL CHEMO IV INJECTION 2 MG/ML
60.0000 mg/m2 | Freq: Once | INTRAVENOUS | Status: AC
  Administered 2021-04-08: 124 mg via INTRAVENOUS
  Filled 2021-04-08: qty 50

## 2021-04-08 MED ORDER — DEXAMETHASONE SODIUM PHOSPHATE 100 MG/10ML IJ SOLN
10.0000 mg | Freq: Once | INTRAMUSCULAR | Status: AC
  Administered 2021-04-08: 10 mg via INTRAVENOUS
  Filled 2021-04-08: qty 10

## 2021-04-08 MED ORDER — HEPARIN SOD (PORK) LOCK FLUSH 100 UNIT/ML IV SOLN
500.0000 [IU] | Freq: Once | INTRAVENOUS | Status: DC
  Filled 2021-04-08: qty 5

## 2021-04-08 MED ORDER — SODIUM CHLORIDE 0.9 % IV SOLN
Freq: Once | INTRAVENOUS | Status: AC
  Filled 2021-04-08: qty 250

## 2021-04-08 MED ORDER — SODIUM CHLORIDE 0.9% FLUSH
10.0000 mL | INTRAVENOUS | Status: DC | PRN
Start: 2021-04-08 — End: 2021-04-08
  Filled 2021-04-08: qty 10

## 2021-04-08 MED ORDER — HEPARIN SOD (PORK) LOCK FLUSH 100 UNIT/ML IV SOLN
500.0000 [IU] | Freq: Once | INTRAVENOUS | Status: AC | PRN
Start: 2021-04-08 — End: 2021-04-08
  Administered 2021-04-08: 500 [IU]
  Filled 2021-04-08: qty 5

## 2021-04-08 MED ORDER — CLONAZEPAM 0.5 MG PO TABS: 0.5000 mg | ORAL_TABLET | Freq: Three times a day (TID) | ORAL | 0 refills | Status: DC | PRN

## 2021-04-08 MED ORDER — PEGFILGRASTIM 6 MG/0.6ML ~~LOC~~ PSKT: 6.0000 mg | PREFILLED_SYRINGE | Freq: Once | SUBCUTANEOUS | Status: DC

## 2021-04-08 MED ORDER — SODIUM CHLORIDE 0.9 % IV SOLN
150.0000 mg | Freq: Once | INTRAVENOUS | Status: AC
  Administered 2021-04-08: 150 mg via INTRAVENOUS
  Filled 2021-04-08: qty 150

## 2021-04-08 MED ORDER — SODIUM CHLORIDE 0.9 % IV SOLN
200.0000 mg | Freq: Once | INTRAVENOUS | Status: AC
  Administered 2021-04-08: 200 mg via INTRAVENOUS
  Filled 2021-04-08: qty 8

## 2021-04-08 MED ORDER — SODIUM CHLORIDE 0.9% FLUSH
10.0000 mL | Freq: Once | INTRAVENOUS | Status: AC
  Administered 2021-04-08: 10 mL via INTRAVENOUS
  Filled 2021-04-08: qty 10

## 2021-04-08 MED ORDER — OLANZAPINE 10 MG PO TABS: 10.0000 mg | ORAL_TABLET | Freq: Every day | ORAL | 0 refills | Status: DC

## 2021-04-08 NOTE — Progress Notes (Signed)
Hematology/Oncology Consult note [facility]  Telephone:([phone] [phone]  Patient Care Team: Juluis Pitch, MD as PCP - General (Family Medicine) Sindy Guadeloupe, MD as Consulting Physician (Hematology and Oncology)   Name of the patient: Vickie Mathis  [id number]  11/04/1990   Date of visit: 04/08/21  Diagnosis- locally advanced 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 breast cancer at least T2 N1 M0  Chief complaint/ Reason for visit-on treatment assessment prior to cycle 2 of AC Keytruda chemotherapy  Heme/Onc history: patient is a 30 year old female who self palpated a left breast mass about 10 months ago and since then it has been slowly growing.  She sought medical attention recently after thinking it was a possible cyst for all this while.She underwent a diagnostic bilateral mammogram and ultrasound which showed a 2.7 cm mass at the 12 o'clock position 6 cm from the nipple.  Ultrasound of the left axilla demonstrates 2 lymph nodes with mild thickened cortices of 4 mm.  There is skin thickening on the lower inner quadrant of the left breast on mammography.  Both the breast mass and the lymph node were biopsied and was positive for invasive mammary carcinoma grade 3.  Lymph node was also suspicious for extracapsular extension.    ER/PR and HER-2 negative   MRI showed irregular enhancing mass at the 12 o'clock position of the left breast measuring 2.5 x 2.3 cm and a linear component extending 2.3 cm anteriorly.  The mass in the anterior linear extension combined measure 4.3 cm.  3.9 cm in the cephalocaudal dimension.  Also an area of clumped non-mass enhancement in the posterior aspect of the lower quadrant of the left breast measuring 2.6 x 0.9 cm which looks suspicious.  2 enlarged left axillary lymph nodes and 2 mildly enlarged internal mammary lymph nodes.  4.3 cm clumped area of non-mass enhancement in the outer quadrant of the right breast.  3 right axillary  lymph nodes with mild cortical thickening.   Patient underwent biopsy of the right breast non-mass enhancement and that was negative for malignancy   CT scan showed mildly enlarged right inguinal lymph nodes nonspecific.  Left upper breast mass with prominent left axillary lymph nodes.  Subcentimeter pulmonary nodules which are calcified and compatible with benign old granulomatous disease.  0.6 5.4 cm lucent lesion in the left first rib possibly a hemangioma or benign lesion.   Patient developed significant infusion reaction to carboplatin with dose 9 when her blood pressure dropped and she became tachycardic and significantly nauseous.    Interval history-patient reports some increased nausea with AC chemotherapy.  Has ongoing fatigue.  Neuropathy is stable.  ECOG PS- 1 Pain scale- 0   Review of systems- Review of Systems  Constitutional:  Positive for malaise/fatigue. Negative for chills, fever and weight loss.  HENT:  Negative for congestion, ear discharge and nosebleeds.   Eyes:  Negative for blurred vision.  Respiratory:  Negative for cough, hemoptysis, sputum production, shortness of breath and wheezing.   Cardiovascular:  Negative for chest pain, palpitations, orthopnea and claudication.  Gastrointestinal:  Negative for abdominal pain, blood in stool, constipation, diarrhea, heartburn, melena, nausea and vomiting.  Genitourinary:  Negative for dysuria, flank pain, frequency, hematuria and urgency.  Musculoskeletal:  Negative for back pain, joint pain and myalgias.  Skin:  Negative for rash.  Neurological:  Positive for sensory change (Peripheral neuropathy). Negative for dizziness, tingling, focal weakness, seizures, weakness and headaches.  Endo/Heme/Allergies:  Does not bruise/bleed easily.  Psychiatric/Behavioral:  Negative for depression and suicidal ideas. The patient does not have insomnia.       Allergies  Allergen Reactions   Carboplatin Shortness Of Breath, Nausea  And Vomiting and Other (See Comments)    Flushing- chest , face, neck , arm including hand   Amoxicillin     Other reaction(s): "too young to remember what they do to me"   Sulfa Antibiotics     Other reaction(s): "too young to remember what they do to me"     Past Medical History:  Diagnosis Date   Anxiety    Asthma    Breast cancer ([place]) 11/2020   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 breast ca   Depression    Family history of cancer    Varicose veins of bilateral lower extremities with pain      Past Surgical History:  Procedure Laterality Date   APPENDECTOMY  2018   BREAST BIOPSY Left 11/26/2020   vision 12:00 6cmfn [facility]   BREAST BIOPSY Left 11/26/2020   LN bx, hydro marker,  fragments of macrometastatic carcinoma   PORTACATH PLACEMENT Right 12/13/2020   Procedure: INSERTION PORT-A-CATH;  Surgeon: Herbert Pun, MD;  Location: [facility] ORS;  Service: General;  Laterality: Right;    Social History   Socioeconomic History   Marital status: Married    Spouse name: Not on file   Number of children: Not on file   Years of education: Not on file   Highest education level: Not on file  Occupational History   Not on file  Tobacco Use   Smoking status: Never   Smokeless tobacco: Never  Vaping Use   Vaping Use: Never used  Substance and Sexual Activity   Alcohol use: Yes    Comment: occassinally    Drug use: Yes    Types: Marijuana   Sexual activity: Yes  Other Topics Concern   Not on file  Social History Narrative   Not on file   Social Determinants of Health   Financial Resource Strain: Not on file  Food Insecurity: Not on file  Transportation Needs: Not on file  Physical Activity: Not on file  Stress: Not on file  Social Connections: Not on file  Intimate Partner Violence: Not on file    Family History  Problem Relation Age of Onset   Diabetes Father    Varicose Veins Father    Diabetes Paternal Aunt    Uterine cancer Paternal Aunt        precancerous    Diabetes Paternal Grandmother    Uterine cancer Paternal Grandmother        precancerous   Thyroid disease Mother    Thyroid disease Maternal Aunt    Thyroid disease Maternal Grandmother    Cancer Other        stomach vs ovarian/cervical   Cancer Paternal Great-grandmother        unk     Current Outpatient Medications:    acetaminophen (TYLENOL) 325 MG tablet, , Disp: , Rfl:    albuterol (VENTOLIN HFA) 108 (90 Base) MCG/ACT inhaler, Inhale 1-2 puffs into the lungs every 6 (six) hours as needed for shortness of breath or wheezing., Disp: , Rfl:    Ascorbic Acid (SUPER C COMPLEX PO), Take 1 capsule by mouth daily., Disp: , Rfl:    busPIRone (BUSPAR) 15 MG tablet, Take 15 mg by mouth 2 (two) times daily., Disp: , Rfl:    citalopram (CELEXA) 40 MG tablet, Take 1 tablet (40 mg total) by  mouth daily., Disp: 90 tablet, Rfl: 2   clonazePAM (KLONOPIN) 0.5 MG tablet, Take 1 tablet (0.5 mg total) by mouth 3 (three) times daily as needed., Disp: 60 tablet, Rfl: 0   dexamethasone (DECADRON) 4 MG tablet, Take 2 tablets (8 mg total) by mouth daily. Start the day after chemotherapy for 2 days., Disp: 30 tablet, Rfl: 1   folic acid (FOLVITE) 1 MG tablet, Take 2 tablets (2 mg total) by mouth daily., Disp: 60 tablet, Rfl: 2   ibuprofen (ADVIL) 200 MG tablet, , Disp: , Rfl:    lidocaine-prilocaine (EMLA) cream, Apply to affected area once, Disp: 30 g, Rfl: 3   LORazepam (ATIVAN) 0.5 MG tablet, TAKE 1 TABLET (0.5 MG TOTAL) BY MOUTH EVERY 6 (SIX) HOURS AS NEEDED (NAUSEA OR VOMITING)., Disp: 30 tablet, Rfl: 0   norethindrone-ethinyl estradiol (LOESTRIN FE) 1-20 MG-MCG tablet, Take 1 tablet by mouth daily. (Patient not taking: No sig reported), Disp: , Rfl:    ondansetron (ZOFRAN) 8 MG tablet, Take 1 tablet (8 mg total) by mouth 2 (two) times daily as needed for refractory nausea / vomiting. Start on day 3 after chemo., Disp: 30 tablet, Rfl: 1   prochlorperazine (COMPAZINE) 10 MG tablet, Take 1 tablet (10 mg  total) by mouth every 6 (six) hours as needed (Nausea or vomiting). (Patient not taking: No sig reported), Disp: 30 tablet, Rfl: 1   triamcinolone ointment (KENALOG) 0.5 %, Apply 1 application topically 2 (two) times daily., Disp: 30 g, Rfl: 0 No current facility-administered medications for this visit.  Facility-Administered Medications Ordered in Other Visits:    heparin lock flush 100 unit/mL, 500 Units, Intravenous, Once, Sindy Guadeloupe, MD  Physical exam:  Vitals:   04/08/21 0849  BP: 120/72  Pulse: (!) 104  Resp: 18  Temp: 97.8 F (36.6 C)  SpO2: 98%  Weight: 197 lb (89.4 kg)   Physical Exam Cardiovascular:     Rate and Rhythm: Regular rhythm. Tachycardia present.     Heart sounds: Normal heart sounds.  Pulmonary:     Effort: Pulmonary effort is normal.     Breath sounds: Normal breath sounds.  Abdominal:     General: Bowel sounds are normal.     Palpations: Abdomen is soft.  Skin:    General: Skin is warm and dry.  Neurological:     Mental Status: She is alert and oriented to person, place, and time.     CMP Latest Ref Rng & Units 03/18/2021  Glucose 70 - 99 mg/dL 103(H)  BUN 6 - 20 mg/dL 11  Creatinine 0.44 - 1.00 mg/dL 0.76  Sodium 135 - 145 mmol/L 140  Potassium 3.5 - 5.1 mmol/L 3.8  Chloride 98 - 111 mmol/L 105  CO2 22 - 32 mmol/L 25  Calcium 8.9 - 10.3 mg/dL 9.3  Total Protein 6.5 - 8.1 g/dL 6.6  Total Bilirubin 0.3 - 1.2 mg/dL 0.6  Alkaline Phos 38 - 126 U/L 66  AST 15 - 41 U/L 27  ALT 0 - 44 U/L 34   CBC Latest Ref Rng & Units 03/18/2021  WBC 4.0 - 10.5 K/uL 4.6  Hemoglobin 12.0 - 15.0 g/dL 11.4(L)  Hematocrit 36.0 - 46.0 % 32.0(L)  Platelets 150 - 400 K/uL 234    No images are attached to the encounter.  ECHOCARDIOGRAM COMPLETE  Result Date: 03/14/2021    ECHOCARDIOGRAM REPORT   Patient Name:   Vickie Mathis Date of Exam: 03/14/2021 Medical Rec #:  [id number]  Height:       69.0 in Accession #:    [id number]     Weight:       195.6 lb Date  of Birth:  08-30-1991      BSA:          2.047 m Patient Age:    30 years       BP:           132/72 mmHg Patient Gender: F              HR:           97 bpm. Exam Location:  [facility] Procedure: 2D Echo, Cardiac Doppler and Color Doppler Indications:     Chemo Z09  History:         Patient has no prior history of Echocardiogram examinations.                  Anxiety, breast cancer.  Sonographer:     Sherrie Sport RDCS (AE) Referring Phys:  [id number] Weston Anna Zuria Fosdick Diagnosing Phys: Harrell Gave End MD  Sonographer Comments: Technically challenging study due to limited acoustic windows, no apical window and suboptimal subcostal window. IMPRESSIONS  1. Left ventricular ejection fraction, by estimation, is 65 to 70%. The left ventricle has normal function. Left ventricular endocardial border not optimally defined to evaluate regional wall motion. There is mild left ventricular hypertrophy. Left ventricular diastolic function could not be evaluated.  2. Right ventricular systolic function is normal. The right ventricular size is normal. Tricuspid regurgitation signal is inadequate for assessing PA pressure.  3. The mitral valve is normal in structure. No evidence of mitral valve regurgitation.  4. The aortic valve is grossly normal. Aortic valve regurgitation is not visualized. Aortic valve gradient not well assessed, though visually there does not appear to be any significant stenosis. FINDINGS  Left Ventricle: Left ventricular ejection fraction, by estimation, is 65 to 70%. The left ventricle has normal function. Left ventricular endocardial border not optimally defined to evaluate regional wall motion. The left ventricular internal cavity size was normal in size. There is mild left ventricular hypertrophy. Left ventricular diastolic function could not be evaluated. Right Ventricle: The right ventricular size is normal. No increase in right ventricular wall thickness. Right ventricular systolic function is normal. Tricuspid  regurgitation signal is inadequate for assessing PA pressure. Left Atrium: Left atrial size was not well visualized. Right Atrium: Right atrial size was not well visualized. Pericardium: There is no evidence of pericardial effusion. Mitral Valve: The mitral valve is normal in structure. No evidence of mitral valve regurgitation. Tricuspid Valve: The tricuspid valve is not well visualized. Tricuspid valve regurgitation is not demonstrated. Aortic Valve: The aortic valve is grossly normal. Aortic valve regurgitation is not visualized. Aortic valve gradient not well assessed, though visually there does not appear to be any significant stenosis. Pulmonic Valve: The pulmonic valve was not well visualized. Pulmonic valve regurgitation is not visualized. No evidence of pulmonic stenosis. Aorta: The aortic root is normal in size and structure. Pulmonary Artery: The pulmonary artery is not well seen. Venous: The inferior vena cava was not well visualized. IAS/Shunts: The interatrial septum was not well visualized.  LEFT VENTRICLE PLAX 2D LVIDd:         3.97 cm LVIDs:         2.82 cm LV PW:         0.95 cm LV IVS:        1.09 cm LVOT diam:  2.00 cm LVOT Area:     3.14 cm  LEFT ATRIUM           Index      RIGHT ATRIUM          Index LA diam:      2.70 cm 1.32 cm/m RA Area:     7.73 cm LA Vol (A4C): 19.9 ml 9.72 ml/m RA Volume:   13.70 ml 6.69 ml/m                        PULMONIC VALVE AORTA                 PV Vmax:        0.96 m/s Ao Root diam: 2.90 cm PV Peak grad:   3.7 mmHg                       RVOT Peak grad: 5 mmHg   SHUNTS Systemic Diam: 2.00 cm Nelva Bush MD Electronically signed by Nelva Bush MD Signature Date/Time: 03/14/2021/4:24:40 PM    Final      Assessment and plan- Patient is a 30 y.o. female with stage IIIb triple negative breast cancer of the left breast cT2 N1 M0.  She is s/p CarboTaxol chemotherapy with Keytruda neoadjuvant.  She is here for on treatment assessment prior to cycle 2 of [facility]  Keytruda chemotherapy neoadjuvant  Counts okay to proceed with cycle 2 of AC Keytruda chemotherapy today with on pro Neulasta support.  I will see her back in 3 weeks for cycle 3.  She will complete 4 cycles in 6 weeks followed by consideration for definitive surgery.  She is leaning towards bilateral mastectomy with reconstruction.  I did inform Dr. Peyton Najjar about this and he will be coordinating her visit with plastic surgery Dr. Marla Roe as well.  Chemo induced nausea: We will add Zyprexa 10 mg nightly  Chemo induced peripheral neuropathy: Currently stable to improved.  She is undergoing acupuncture   Visit Diagnosis 1. Malignant neoplasm of upper-outer quadrant of left breast in female, estrogen receptor negative ([place])   2. Encounter for antineoplastic chemotherapy   3. Chemotherapy-induced peripheral neuropathy ([place])   4. Chemotherapy-induced nausea      Dr. Randa Evens, MD, MPH [facility] [phone] 04/08/2021 9:39 AM

## 2021-04-08 NOTE — Patient Instructions (Signed)
[facility]  Discharge Instructions: Thank you for choosing [facility] to provide your oncology and hematology care.  If you have a lab appointment with the [facility], please go directly to the [facility] and check in at the registration area.  Wear comfortable clothing and clothing appropriate for easy access to any Portacath or PICC line.   We strive to give you quality time with your provider. You may need to reschedule your appointment if you arrive late (15 or more minutes).  Arriving late affects you and other patients whose appointments are after yours.  Also, if you miss three or more appointments without notifying the office, you may be dismissed from the clinic at the provider's discretion.      For prescription refill requests, have your pharmacy contact our office and allow 72 hours for refills to be completed.    Today you received the following chemotherapy and/or immunotherapy agents KEYTRUDA, ADRIAMYCIN, CYTOXANPembrolizumab injection What is this medication? PEMBROLIZUMAB (pem broe liz ue mab) is a monoclonal antibody. It is used totreat certain types of cancer. This medicine may be used for other purposes; ask your health care provider orpharmacist if you have questions. COMMON BRAND NAME(S): Keytruda What should I tell my care team before I take this medication? They need to know if you have any of these conditions: autoimmune diseases like Crohn's disease, ulcerative colitis, or lupus have had or planning to have an allogeneic stem cell transplant (uses someone else's stem cells) history of organ transplant history of chest radiation nervous system problems like myasthenia gravis or Guillain-Barre syndrome an unusual or allergic reaction to pembrolizumab, other medicines, foods, dyes, or preservatives pregnant or trying to get pregnant breast-feeding How should I use this medication? This medicine is for infusion  into a vein. It is given by a health careprofessional in a hospital or clinic setting. A special MedGuide will be given to you before each treatment. Be sure to readthis information carefully each time. Talk to your pediatrician regarding the use of this medicine in children. While this drug may be prescribed for children as young as 6 months for selectedconditions, precautions do apply. Overdosage: If you think you have taken too much of this medicine contact apoison control center or emergency room at once. NOTE: This medicine is only for you. Do not share this medicine with others. What if I miss a dose? It is important not to miss your dose. Call your doctor or health careprofessional if you are unable to keep an appointment. What may interact with this medication? Interactions have not been studied. This list may not describe all possible interactions. Give your health care provider a list of all the medicines, herbs, non-prescription drugs, or dietary supplements you use. Also tell them if you smoke, drink alcohol, or use illegaldrugs. Some items may interact with your medicine. What should I watch for while using this medication? Your condition will be monitored carefully while you are receiving thismedicine. You may need blood work done while you are taking this medicine. Do not become pregnant while taking this medicine or for 4 months after stopping it. Women should inform their doctor if they wish to become pregnant or think they might be pregnant. There is a potential for serious side effects to an unborn child. Talk to your health care professional or pharmacist for more information. Do not breast-feed an infant while taking this medicine orfor 4 months after the last dose. What side effects  may I notice from receiving this medication? Side effects that you should report to your doctor or health care professionalas soon as possible: allergic reactions like skin rash, itching or hives,  swelling of the face, lips, or tongue bloody or black, tarry breathing problems changes in vision chest pain chills confusion constipation cough diarrhea dizziness or feeling faint or lightheaded fast or irregular heartbeat fever flushing joint pain low blood counts - this medicine may decrease the number of white blood cells, red blood cells and platelets. You may be at increased risk for infections and bleeding. muscle pain muscle weakness pain, tingling, numbness in the hands or feet persistent headache redness, blistering, peeling or loosening of the skin, including inside the mouth signs and symptoms of high blood sugar such as dizziness; dry mouth; dry skin; fruity breath; nausea; stomach pain; increased hunger or thirst; increased urination signs and symptoms of kidney injury like trouble passing urine or change in the amount of urine signs and symptoms of liver injury like dark urine, light-colored stools, loss of appetite, nausea, right upper belly pain, yellowing of the eyes or skin sweating swollen lymph nodes weight loss Side effects that usually do not require medical attention (report to yourdoctor or health care professional if they continue or are bothersome): decreased appetite hair loss tiredness This list may not describe all possible side effects. Call your doctor for medical advice about side effects. You may report side effects to FDA at1-800-FDA-1088. Where should I keep my medication? This drug is given in a hospital or clinic and will not be stored at home. NOTE: This sheet is a summary. It may not cover all possible information. If you have questions about this medicine, talk to your doctor, pharmacist, orhealth care provider.  2022 Elsevier/Gold Standard (2019-08-23 21:44:53) Cyclophosphamide Injection What is this medication? CYCLOPHOSPHAMIDE (sye kloe FOSS fa mide) is a chemotherapy drug. It slows the growth of cancer cells. This medicine is used to  treat many types of cancer like lymphoma, myeloma, leukemia, breast cancer, and ovarian cancer, to name afew. This medicine may be used for other purposes; ask your health care provider orpharmacist if you have questions. COMMON BRAND NAME(S): Cytoxan, Neosar What should I tell my care team before I take this medication? They need to know if you have any of these conditions: heart disease history of irregular heartbeat infection kidney disease liver disease low blood counts, like white cells, platelets, or red blood cells on hemodialysis recent or ongoing radiation therapy scarring or thickening of the lungs trouble passing urine an unusual or allergic reaction to cyclophosphamide, other medicines, foods, dyes, or preservatives pregnant or trying to get pregnant breast-feeding How should I use this medication? This drug is usually given as an injection into a vein or muscle or by infusion into a vein. It is administered in a hospital or clinic by a specially trainedhealth care professional. Talk to your pediatrician regarding the use of this medicine in children.Special care may be needed. Overdosage: If you think you have taken too much of this medicine contact apoison control center or emergency room at once. NOTE: This medicine is only for you. Do not share this medicine with others. What if I miss a dose? It is important not to miss your dose. Call your doctor or health careprofessional if you are unable to keep an appointment. What may interact with this medication? amphotericin B azathioprine certain antivirals for HIV or hepatitis certain medicines for blood pressure, heart disease, irregular heart beat certain  medicines that treat or prevent blood clots like warfarin certain other medicines for cancer cyclosporine etanercept indomethacin medicines that relax muscles for surgery medicines to increase blood counts metronidazole This list may not describe all possible  interactions. Give your health care provider a list of all the medicines, herbs, non-prescription drugs, or dietary supplements you use. Also tell them if you smoke, drink alcohol, or use illegaldrugs. Some items may interact with your medicine. What should I watch for while using this medication? Your condition will be monitored carefully while you are receiving thismedicine. You may need blood work done while you are taking this medicine. Drink water or other fluids as directed. Urinate often, even at night. Some products may contain alcohol. Ask your health care professional if this medicine contains alcohol. Be sure to tell all health care professionals you are taking this medicine. Certain medicines, like metronidazole and disulfiram, can cause an unpleasant reaction when taken with alcohol. The reaction includes flushing, headache, nausea, vomiting, sweating, and increased thirst. Thereaction can last from 30 minutes to several hours. Do not become pregnant while taking this medicine or for 1 year after stopping it. Women should inform their health care professional if they wish to become pregnant or think they might be pregnant. Men should not father a child while taking this medicine and for 4 months after stopping it. There is potential for serious side effects to an unborn child. Talk to your health care professionalfor more information. Do not breast-feed an infant while taking this medicine or for 1 week afterstopping it. This medicine has caused ovarian failure in some women. This medicine may make it more difficult to get pregnant. Talk to your health care professional if [place] concerned about your fertility. This medicine has caused decreased sperm counts in some men. This may make it more difficult to father a child. Talk to your health care professional if [place] concerned about your fertility. Call your health care professional for advice if you get a fever, chills, or sore throat, or  other symptoms of a cold or flu. Do not treat yourself. This medicine decreases your body's ability to fight infections. Try to avoid beingaround people who are sick. Avoid taking medicines that contain aspirin, acetaminophen, ibuprofen, naproxen, or ketoprofen unless instructed by your health care professional.These medicines may hide a fever. Talk to your health care professional about your risk of cancer. You may bemore at risk for certain types of cancer if you take this medicine. If you are going to need surgery or other procedure, tell your health careprofessional that you are using this medicine. Be careful brushing or flossing your teeth or using a toothpick because you may get an infection or bleed more easily. If you have any dental work done, Primary school teacher you are receiving this medicine. What side effects may I notice from receiving this medication? Side effects that you should report to your doctor or health care professionalas soon as possible: allergic reactions like skin rash, itching or hives, swelling of the face, lips, or tongue breathing problems nausea, vomiting signs and symptoms of bleeding such as bloody or black, tarry stools; red or dark brown urine; spitting up blood or brown material that looks like coffee grounds; red spots on the skin; unusual bruising or bleeding from the eyes, gums, or nose signs and symptoms of heart failure like fast, irregular heartbeat, sudden weight gain; swelling of the ankles, feet, hands signs and symptoms of infection like fever; chills; cough; sore throat; pain or  trouble passing urine signs and symptoms of kidney injury like trouble passing urine or change in the amount of urine signs and symptoms of liver injury like dark yellow or brown urine; general ill feeling or flu-like symptoms; light-colored stools; loss of appetite; nausea; right upper belly pain; unusually weak or tired; yellowing of the eyes or skin Side effects that usually do  not require medical attention (report to yourdoctor or health care professional if they continue or are bothersome): confusion decreased hearing diarrhea facial flushing hair loss headache loss of appetite missed menstrual periods signs and symptoms of low red blood cells or anemia such as unusually weak or tired; feeling faint or lightheaded; falls skin discoloration This list may not describe all possible side effects. Call your doctor for medical advice about side effects. You may report side effects to FDA at1-800-FDA-1088. Where should I keep my medication? This drug is given in a hospital or clinic and will not be stored at home. NOTE: This sheet is a summary. It may not cover all possible information. If you have questions about this medicine, talk to your doctor, pharmacist, orhealth care provider.  2022 Elsevier/Gold Standard (2019-06-26 09:53:29) Doxorubicin injection What is this medication? DOXORUBICIN (dox oh ROO bi sin) is a chemotherapy drug. It is used to treat many kinds of cancer like leukemia, lymphoma, neuroblastoma, sarcoma, and Wilms' tumor. It is also used to treat bladder cancer, breast cancer, lungcancer, ovarian cancer, stomach cancer, and thyroid cancer. This medicine may be used for other purposes; ask your health care provider orpharmacist if you have questions. COMMON BRAND NAME(S): Adriamycin, Adriamycin PFS, Adriamycin RDF, Rubex What should I tell my care team before I take this medication? They need to know if you have any of these conditions: heart disease history of low blood counts caused by a medicine liver disease recent or ongoing radiation therapy an unusual or allergic reaction to doxorubicin, other chemotherapy agents, other medicines, foods, dyes, or preservatives pregnant or trying to get pregnant breast-feeding How should I use this medication? This drug is given as an infusion into a vein. It is administered in a hospital or clinic by a  specially trained health care professional. If you have pain, swelling, burning or any unusual feeling around the site of your injection,tell your health care professional right away. Talk to your pediatrician regarding the use of this medicine in children.Special care may be needed. Overdosage: If you think you have taken too much of this medicine contact apoison control center or emergency room at once. NOTE: This medicine is only for you. Do not share this medicine with others. What if I miss a dose? It is important not to miss your dose. Call your doctor or health careprofessional if you are unable to keep an appointment. What may interact with this medication? This medicine may interact with the following medications: 6-mercaptopurine paclitaxel phenytoin St. John's Wort trastuzumab verapamil This list may not describe all possible interactions. Give your health care provider a list of all the medicines, herbs, non-prescription drugs, or dietary supplements you use. Also tell them if you smoke, drink alcohol, or use illegaldrugs. Some items may interact with your medicine. What should I watch for while using this medication? This drug may make you feel generally unwell. This is not uncommon, as chemotherapy can affect healthy cells as well as cancer cells. Report any side effects. Continue your course of treatment even though you feel ill unless yourdoctor tells you to stop. There is a maximum amount  of this medicine you should receive throughout your life. The amount depends on the medical condition being treated and your overall health. Your doctor will watch how much of this medicine you receive inyour lifetime. Tell your doctor if you have taken this medicine before. You may need blood work done while you are taking this medicine. Your urine may turn red for a few days after your dose. This is not blood. Ifyour urine is dark or brown, call your doctor. In some cases, you may be given  additional medicines to help with side effects.Follow all directions for their use. Call your doctor or health care professional for advice if you get a fever, chills or sore throat, or other symptoms of a cold or flu. Do not treat yourself. This drug decreases your body's ability to fight infections. Try toavoid being around people who are sick. This medicine may increase your risk to bruise or bleed. Call your doctor orhealth care professional if you notice any unusual bleeding. Talk to your doctor about your risk of cancer. You may be more at risk forcertain types of cancers if you take this medicine. Do not become pregnant while taking this medicine or for 6 months after stopping it. Women should inform their doctor if they wish to become pregnant or think they might be pregnant. Men should not father a child while taking this medicine and for 6 months after stopping it. There is a potential for serious side effects to an unborn child. Talk to your health care professional or pharmacist for more information. Do not breast-feed an infant while takingthis medicine. This medicine has caused ovarian failure in some women and reduced sperm counts in some men This medicine may interfere with the ability to have a child. Talk with your doctor or health care professional if you are concerned about yourfertility. This medicine may cause a decrease in Co-Enzyme Q-10. You should make sure that you get enough Co-Enzyme Q-10 while you are taking this medicine. Discuss thefoods you eat and the vitamins you take with your health care professional. What side effects may I notice from receiving this medication? Side effects that you should report to your doctor or health care professionalas soon as possible: allergic reactions like skin rash, itching or hives, swelling of the face, lips, or tongue breathing problems chest pain fast or irregular heartbeat low blood counts - this medicine may decrease the number of  white blood cells, red blood cells and platelets. You may be at increased risk for infections and bleeding. pain, redness, or irritation at site where injected signs of infection - fever or chills, cough, sore throat, pain or difficulty passing urine signs of decreased platelets or bleeding - bruising, pinpoint red spots on the skin, black, tarry stools, blood in the urine swelling of the ankles, feet, hands tiredness weakness Side effects that usually do not require medical attention (report to yourdoctor or health care professional if they continue or are bothersome): diarrhea hair loss mouth sores nail discoloration or damage nausea red colored urine vomiting This list may not describe all possible side effects. Call your doctor for medical advice about side effects. You may report side effects to FDA at1-800-FDA-1088. Where should I keep my medication? This drug is given in a hospital or clinic and will not be stored at home. NOTE: This sheet is a summary. It may not cover all possible information. If you have questions about this medicine, talk to your doctor, pharmacist, orhealth care provider.  2022  Elsevier/Gold Standard (2017-05-05 11:01:26)    To help prevent nausea and vomiting after your treatment, we encourage you to take your nausea medication as directed.  BELOW ARE SYMPTOMS THAT SHOULD BE REPORTED IMMEDIATELY: *FEVER GREATER THAN 100.4 F (38 C) OR HIGHER *CHILLS OR SWEATING *NAUSEA AND VOMITING THAT IS NOT CONTROLLED WITH YOUR NAUSEA MEDICATION *UNUSUAL SHORTNESS OF BREATH *UNUSUAL BRUISING OR BLEEDING *URINARY PROBLEMS (pain or burning when urinating, or frequent urination) *BOWEL PROBLEMS (unusual diarrhea, constipation, pain near the anus) TENDERNESS IN MOUTH AND THROAT WITH OR WITHOUT PRESENCE OF ULCERS (sore throat, sores in mouth, or a toothache) UNUSUAL RASH, SWELLING OR PAIN  UNUSUAL VAGINAL DISCHARGE OR ITCHING   Items with * indicate a potential  emergency and should be followed up as soon as possible or go to the Emergency Department if any problems should occur.  Please show the CHEMOTHERAPY ALERT CARD or IMMUNOTHERAPY ALERT CARD at check-in to the Emergency Department and triage nurse.  Should you have questions after your visit or need to cancel or reschedule your appointment, please contact [facility]  [phone] and follow the prompts.  Office hours are 8:00 a.m. to 4:30 p.m. Monday - Friday. Please note that voicemails left after 4:00 p.m. may not be returned until the following business day.  We are closed weekends and major holidays. You have access to a nurse at all times for urgent questions. Please call the main number to the clinic [phone] and follow the prompts.  For any non-urgent questions, you may also contact your provider using MyChart. We now offer e-Visits for anyone 62 and older to request care online for non-urgent symptoms. For details visit [email or web address].   Also download the MyChart app! Go to the app store, search "MyChart", open the app, select [facility], and log in with your MyChart username and password.  Due to Covid, a mask is required upon entering the hospital/clinic. If you do not have a mask, one will be given to you upon arrival. For doctor visits, patients may have 1 support person aged 4 or older with them. For treatment visits, patients cannot have anyone with them due to current Covid guidelines and our immunocompromised population.

## 2021-04-08 NOTE — Progress Notes (Signed)
Patient states the her appetite has improved some, has some nausea but nausea meds help. Her right hand has improved some with acupuncture. 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

## 2021-04-15 ENCOUNTER — Ambulatory Visit (INDEPENDENT_AMBULATORY_CARE_PROVIDER_SITE_OTHER): Payer: 59 | Admitting: Plastic Surgery

## 2021-04-15 ENCOUNTER — Other Ambulatory Visit: Payer: Self-pay

## 2021-04-15 ENCOUNTER — Encounter: Payer: Self-pay | Admitting: Plastic Surgery

## 2021-04-15 VITALS — BP 110/74 | HR 106 | Ht 69.0 in | Wt 197.0 lb

## 2021-04-15 DIAGNOSIS — C50919 Malignant neoplasm of unspecified site of unspecified female breast: Secondary | ICD-10-CM | POA: Diagnosis not present

## 2021-04-15 DIAGNOSIS — N6320 Unspecified lump in the left breast, unspecified quadrant: Secondary | ICD-10-CM | POA: Diagnosis not present

## 2021-04-15 NOTE — Progress Notes (Signed)
Patient ID: Vickie Mathis, female    DOB: 1991-05-01, 30 y.o.   MRN: [id number]   Chief Complaint  Patient presents with   Advice Only    The patient is a 30 yrs old wf here with her husband for a consultation for breast reconstruction.  The patient noticed a lump on her left breast about a year ago.  She thought that it was likely a cyst and went for mammogram followed by an ultrasound and biopsy.  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  It is estrogen progesterone negative and HER2 negative.  Is approximately at the 12 o'clock position of the left breast measuring 2.5 x 2.3.  There was also a suspicious looking lesion in the lower quadrant of the left breast and 2 large lymph nodes.  She has a benign mass of the right breast.  The patient is 5 feet 9 inches tall and weighs 197 pounds.  Preoperative bra size is B/C cup.  This is a little more than her usual.  She has been on chemotherapy and has gained a little bit of weight from this.  She is seeing Dr. Windell Moment.  She is not a smoker and does not have diabetes.  She does not have a known family history of breast cancer.  Radiation is likely part of the plan.  At this point she is thinking about bilateral mastectomies but she is not for sure.  She wants to talk to her oncologist to see what her risk reduction would be by doing bilateral mastectomies.  She and her husband were very attentive and asked very good questions throughout the exam.   Review of Systems  Constitutional: Negative.   HENT: Negative.    Eyes: Negative.   Respiratory: Negative.    Cardiovascular: Negative.   Gastrointestinal: Negative.   Endocrine: Negative.   Genitourinary: Negative.   Hematological: Negative.   Psychiatric/Behavioral: Negative.     Past Medical History:  Diagnosis Date   Anxiety    Asthma    Breast cancer ([place]) 11/2020   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 breast ca   Depression    Family history of cancer     Varicose veins of bilateral lower extremities with pain     Past Surgical History:  Procedure Laterality Date   APPENDECTOMY  2018   BREAST BIOPSY Left 11/26/2020   vision 12:00 6cmfn [facility]   BREAST BIOPSY Left 11/26/2020   LN bx, hydro marker,  fragments of macrometastatic carcinoma   PORTACATH PLACEMENT Right 12/13/2020   Procedure: INSERTION PORT-A-CATH;  Surgeon: Herbert Pun, MD;  Location: [facility] ORS;  Service: General;  Laterality: Right;      Current Outpatient Medications:    acetaminophen (TYLENOL) 325 MG tablet, , Disp: , Rfl:    albuterol (VENTOLIN HFA) 108 (90 Base) MCG/ACT inhaler, Inhale 1-2 puffs into the lungs every 6 (six) hours as needed for shortness of breath or wheezing., Disp: , Rfl:    Ascorbic Acid (SUPER C COMPLEX PO), Take 1 capsule by mouth daily., Disp: , Rfl:    busPIRone (BUSPAR) 15 MG tablet, Take 15 mg by mouth 2 (two) times daily., Disp: , Rfl:    citalopram (CELEXA) 40 MG tablet, Take 1 tablet (40 mg total) by mouth daily., Disp: 90 tablet, Rfl: 2   clonazePAM (KLONOPIN) 0.5 MG tablet, Take 1 tablet (0.5 mg total) by mouth 3 (three) times daily as needed., Disp: 60 tablet, Rfl: 0  dexamethasone (DECADRON) 4 MG tablet, Take 2 tablets (8 mg total) by mouth daily. Start the day after chemotherapy for 2 days., Disp: 30 tablet, Rfl: 1   folic acid (FOLVITE) 1 MG tablet, Take 2 tablets (2 mg total) by mouth daily., Disp: 60 tablet, Rfl: 2   ibuprofen (ADVIL) 200 MG tablet, , Disp: , Rfl:    Leuprolide Acetate, 3 Month, (LUPRON DEPOT, 63-MONTH, IM), Inject into the muscle. Lupron Injection-Every 3 mos, Disp: , Rfl:    lidocaine-prilocaine (EMLA) cream, Apply to affected area once, Disp: 30 g, Rfl: 3   OLANZapine (ZYPREXA) 10 MG tablet, Take 1 tablet (10 mg total) by mouth at bedtime., Disp: 30 tablet, Rfl: 0   ondansetron (ZOFRAN) 8 MG tablet, Take 1 tablet (8 mg total) by mouth 2 (two) times daily as needed for refractory nausea / vomiting. Start on day  3 after chemo., Disp: 30 tablet, Rfl: 1   prochlorperazine (COMPAZINE) 10 MG tablet, Take 1 tablet (10 mg total) by mouth every 6 (six) hours as needed (Nausea or vomiting)., Disp: 30 tablet, Rfl: 1   triamcinolone ointment (KENALOG) 0.5 %, Apply 1 application topically 2 (two) times daily., Disp: 30 g, Rfl: 0   norethindrone-ethinyl estradiol (LOESTRIN FE) 1-20 MG-MCG tablet, Take 1 tablet by mouth daily. (Patient not taking: No sig reported), Disp: , Rfl:    Objective:   Vitals:   04/15/21 1156  BP: 110/74  Pulse: (!) 106  SpO2: 98%    Physical Exam Vitals and nursing note reviewed.  Constitutional:      Appearance: Normal appearance.  HENT:     Head: Normocephalic and atraumatic.  Cardiovascular:     Rate and Rhythm: Normal rate.     Pulses: Normal pulses.  Pulmonary:     Effort: Pulmonary effort is normal.  Abdominal:     General: Abdomen is flat. There is no distension.  Skin:    Capillary Refill: Capillary refill takes less than 2 seconds.     Coloration: Skin is not jaundiced.     Findings: No bruising.  Neurological:     General: No focal deficit present.     Mental Status: She is alert.     Cranial Nerves: No cranial nerve deficit.    Assessment & Plan:  Breast mass, left  Invasive carcinoma of breast ([place])  The options for reconstruction we explained to the patient / family for breast reconstruction.  There are two general categories of reconstruction.  We can reconstruction a breast with implants or use the patient's own tissue.  These were further discussed as listed.  Breast reconstruction is an optional procedure and eligibility depends on the full spectrum of the health of the patient and any co-morbidities.  More than one surgery is often needed to complete the reconstruction process.  The process can take three to twelve months to complete.  The breasts will not be identical due to many factors such as rib differences, shoulder asymmetry and treatments  such as radiation.  The goal is to get the breasts to look normal and symmetrical in clothes.  Scars are a part of surgery and may fade some in time but will always be present under clothes.  Surgery may be an option on the non-cancer breast to achieve more symmetry.  No matter which procedure is chosen there is always the risk of complications and even failure of the body to heal.  This could result in no breast.    The options for reconstruction  include:  1. Placement of a tissue expander with Acellular dermal matrix. When the expander is the desired size surgery is performed to remove the expander and place an implant.  In some cases the implant can be placed without an expander.  2. Autologous reconstruction can include using a muscle or tissue from another area of the body to create a breast.  3. Combined procedures (ie. latissismus dorsi flap) can be done with an expander / implant placed under the muscle.   The risks, benefits, scars and recovery time were discussed for each of the above. Risks include bleeding, infection, hematoma, seroma, scarring, pain, wound healing complications, flap loss, fat necrosis, capsular contracture, need for implant removal, donor site complications, bulge, hernia, umbilical necrosis, need for urgent reoperation, and need for dressing changes.   The procedure the patient selected / that was best for the patient, was then discussed in further detail.  Total time: 45 minutes. This includes time spent with the patient during the visit as well as time spent before and after the visit reviewing the chart, documenting the encounter, making phone calls and reviewing studies.  I spoke with Dr. Windell Moment about the above information as well as the following information.  So far the plan is bilateral immediate breast reconstruction with expanders and Flex HD.  If the patient decides to just do unilateral left-sided reconstruction mastectomy she will let me know.  She is going  to meet with the oncologist and this will help her to decide.  We will also do a telemetry visit in 2 weeks after she has talked with oncology and Dr. Windell Moment.  Likely this will be in September.   [place], DO

## 2021-04-18 ENCOUNTER — Other Ambulatory Visit: Payer: Self-pay | Admitting: Oncology

## 2021-04-18 DIAGNOSIS — C50412 Malignant neoplasm of upper-outer quadrant of left female breast: Secondary | ICD-10-CM

## 2021-04-29 ENCOUNTER — Other Ambulatory Visit: Payer: Self-pay | Admitting: *Deleted

## 2021-04-29 ENCOUNTER — Encounter: Payer: Self-pay | Admitting: Oncology

## 2021-04-29 ENCOUNTER — Inpatient Hospital Stay (HOSPITAL_BASED_OUTPATIENT_CLINIC_OR_DEPARTMENT_OTHER): Payer: 59 | Admitting: Oncology

## 2021-04-29 ENCOUNTER — Inpatient Hospital Stay: Payer: 59

## 2021-04-29 ENCOUNTER — Telehealth: Payer: Self-pay | Admitting: *Deleted

## 2021-04-29 VITALS — BP 111/68 | HR 108 | Temp 98.2°F | Resp 20 | Wt 195.3 lb

## 2021-04-29 DIAGNOSIS — Z5111 Encounter for antineoplastic chemotherapy: Secondary | ICD-10-CM

## 2021-04-29 DIAGNOSIS — G620 Drug-induced polyneuropathy: Secondary | ICD-10-CM

## 2021-04-29 DIAGNOSIS — R110 Nausea: Secondary | ICD-10-CM

## 2021-04-29 DIAGNOSIS — Z171 Estrogen receptor negative status [ER-]: Secondary | ICD-10-CM

## 2021-04-29 DIAGNOSIS — C50412 Malignant neoplasm of upper-outer quadrant of left female breast: Secondary | ICD-10-CM

## 2021-04-29 DIAGNOSIS — C50919 Malignant neoplasm of unspecified site of unspecified female breast: Secondary | ICD-10-CM

## 2021-04-29 DIAGNOSIS — T451X5A Adverse effect of antineoplastic and immunosuppressive drugs, initial encounter: Secondary | ICD-10-CM

## 2021-04-29 DIAGNOSIS — D6481 Anemia due to antineoplastic chemotherapy: Secondary | ICD-10-CM

## 2021-04-29 LAB — CBC WITH DIFFERENTIAL/PLATELET
Abs Immature Granulocytes: 0.08 10*3/uL — ABNORMAL HIGH (ref 0.00–0.07)
Basophils Absolute: 0.1 10*3/uL (ref 0.0–0.1)
Basophils Relative: 2 %
Eosinophils Absolute: 0.1 10*3/uL (ref 0.0–0.5)
Eosinophils Relative: 2 %
HCT: 33.3 % — ABNORMAL LOW (ref 36.0–46.0)
Hemoglobin: 12 g/dL (ref 12.0–15.0)
Immature Granulocytes: 2 %
Lymphocytes Relative: 24 %
Lymphs Abs: 1.1 10*3/uL (ref 0.7–4.0)
MCH: 36.3 pg — ABNORMAL HIGH (ref 26.0–34.0)
MCHC: 36 g/dL (ref 30.0–36.0)
MCV: 100.6 fL — ABNORMAL HIGH (ref 80.0–100.0)
Monocytes Absolute: 0.9 10*3/uL (ref 0.1–1.0)
Monocytes Relative: 20 %
Neutro Abs: 2.3 10*3/uL (ref 1.7–7.7)
Neutrophils Relative %: 50 %
Platelets: 251 10*3/uL (ref 150–400)
RBC: 3.31 MIL/uL — ABNORMAL LOW (ref 3.87–5.11)
RDW: 13 % (ref 11.5–15.5)
WBC: 4.5 10*3/uL (ref 4.0–10.5)
nRBC: 0 % (ref 0.0–0.2)

## 2021-04-29 LAB — COMPREHENSIVE METABOLIC PANEL
ALT: 43 U/L (ref 0–44)
AST: 37 U/L (ref 15–41)
Albumin: 4.6 g/dL (ref 3.5–5.0)
Alkaline Phosphatase: 68 U/L (ref 38–126)
Anion gap: 11 (ref 5–15)
BUN: <5 mg/dL — ABNORMAL LOW (ref 6–20)
CO2: 24 mmol/L (ref 22–32)
Calcium: 9.2 mg/dL (ref 8.9–10.3)
Chloride: 103 mmol/L (ref 98–111)
Creatinine, Ser: 0.53 mg/dL (ref 0.44–1.00)
GFR, Estimated: >60 mL/min (ref >60)
Glucose, Bld: 100 mg/dL — ABNORMAL HIGH (ref 70–99)
Potassium: 3.5 mmol/L (ref 3.5–5.1)
Sodium: 138 mmol/L (ref 135–145)
Total Bilirubin: 0.4 mg/dL (ref 0.3–1.2)
Total Protein: 7 g/dL (ref 6.5–8.1)

## 2021-04-29 MED ORDER — CYANOCOBALAMIN 1000 MCG/ML IJ SOLN
1000.0000 ug | INTRAMUSCULAR | Status: DC
  Administered 2021-04-29: 1000 ug via INTRAMUSCULAR

## 2021-04-29 MED ORDER — CYCLOBENZAPRINE HCL 5 MG PO TABS
5.0000 mg | ORAL_TABLET | Freq: Three times a day (TID) | ORAL | 0 refills | Status: DC | PRN
Start: 2021-04-29 — End: 2021-08-27

## 2021-04-29 MED ORDER — HEPARIN SOD (PORK) LOCK FLUSH 100 UNIT/ML IV SOLN
INTRAVENOUS | Status: AC
  Filled 2021-04-29: qty 5

## 2021-04-29 MED ORDER — SODIUM CHLORIDE 0.9% FLUSH
10.0000 mL | Freq: Once | INTRAVENOUS | Status: AC
Start: 2021-04-29 — End: 2021-04-29
  Administered 2021-04-29: 10 mL via INTRAVENOUS
  Filled 2021-04-29: qty 10

## 2021-04-29 MED ORDER — PALONOSETRON HCL INJECTION 0.25 MG/5ML
0.2500 mg | Freq: Once | INTRAVENOUS | Status: AC
  Administered 2021-04-29: 0.25 mg via INTRAVENOUS

## 2021-04-29 MED ORDER — DOXORUBICIN HCL CHEMO IV INJECTION 2 MG/ML
60.0000 mg/m2 | Freq: Once | INTRAVENOUS | Status: AC
  Administered 2021-04-29: 124 mg via INTRAVENOUS
  Filled 2021-04-29: qty 62

## 2021-04-29 MED ORDER — SODIUM CHLORIDE 0.9 % IV SOLN
150.0000 mg | Freq: Once | INTRAVENOUS | Status: AC
  Administered 2021-04-29: 150 mg via INTRAVENOUS
  Filled 2021-04-29: qty 150

## 2021-04-29 MED ORDER — SODIUM CHLORIDE 0.9 % IV SOLN
10.0000 mg | Freq: Once | INTRAVENOUS | Status: AC
  Administered 2021-04-29: 10 mg via INTRAVENOUS
  Filled 2021-04-29: qty 10

## 2021-04-29 MED ORDER — SODIUM CHLORIDE 0.9 % IV SOLN
Freq: Once | INTRAVENOUS | Status: AC
  Filled 2021-04-29: qty 250

## 2021-04-29 MED ORDER — HEPARIN SOD (PORK) LOCK FLUSH 100 UNIT/ML IV SOLN
500.0000 [IU] | Freq: Once | INTRAVENOUS | Status: AC
  Administered 2021-04-29: 500 [IU] via INTRAVENOUS
  Filled 2021-04-29: qty 5

## 2021-04-29 MED ORDER — SODIUM CHLORIDE 0.9 % IV SOLN
200.0000 mg | Freq: Once | INTRAVENOUS | Status: AC
  Administered 2021-04-29: 200 mg via INTRAVENOUS
  Filled 2021-04-29: qty 8

## 2021-04-29 MED ORDER — METAXALONE 800 MG PO TABS: 800.0000 mg | ORAL_TABLET | Freq: Three times a day (TID) | ORAL | 0 refills | Status: DC

## 2021-04-29 MED ORDER — SODIUM CHLORIDE 0.9 % IV SOLN
600.0000 mg/m2 | Freq: Once | INTRAVENOUS | Status: AC
  Administered 2021-04-29: 1240 mg via INTRAVENOUS
  Filled 2021-04-29: qty 62

## 2021-04-29 NOTE — Addendum Note (Signed)
Addended by: Randa Evens C on: 04/29/2021 02:17 PM   Modules accepted: Orders

## 2021-04-29 NOTE — Progress Notes (Addendum)
Hematology/Oncology Consult note [facility]  Telephone:([phone] [phone]  Patient Care Team: Juluis Pitch, MD as PCP - General (Family Medicine) Sindy Guadeloupe, MD as Consulting Physician (Hematology and Oncology)   Name of the patient: Vickie Mathis  [id number]  [birth year]   Date of visit: 04/29/21  Diagnosis- locally advanced 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 breast cancer at least T2 N1 M0  Chief complaint/ Reason for visit-on treatment assessment prior to cycle 3 of neoadjuvant AC Keytruda chemotherapy  Heme/Onc history: patient is a 30 year old female who self palpated a left breast mass about 10 months ago and since then it has been slowly growing.  She sought medical attention recently after thinking it was a possible cyst for all this while.She underwent a diagnostic bilateral mammogram and ultrasound which showed a 2.7 cm mass at the 12 o'clock position 6 cm from the nipple.  Ultrasound of the left axilla demonstrates 2 lymph nodes with mild thickened cortices of 4 mm.  There is skin thickening on the lower inner quadrant of the left breast on mammography.  Both the breast mass and the lymph node were biopsied and was positive for invasive mammary carcinoma grade 3.  Lymph node was also suspicious for extracapsular extension.    ER/PR and HER-2 negative   MRI showed irregular enhancing mass at the 12 o'clock position of the left breast measuring 2.5 x 2.3 cm and a linear component extending 2.3 cm anteriorly.  The mass in the anterior linear extension combined measure 4.3 cm.  3.9 cm in the cephalocaudal dimension.  Also an area of clumped non-mass enhancement in the posterior aspect of the lower quadrant of the left breast measuring 2.6 x 0.9 cm which looks suspicious.  2 enlarged left axillary lymph nodes and 2 mildly enlarged internal mammary lymph nodes.  4.3 cm clumped area of non-mass enhancement in the outer quadrant of the right breast.  3 right  axillary lymph nodes with mild cortical thickening.   Patient underwent biopsy of the right breast non-mass enhancement and that was negative for malignancy   CT scan showed mildly enlarged right inguinal lymph nodes nonspecific.  Left upper breast mass with prominent left axillary lymph nodes.  Subcentimeter pulmonary nodules which are calcified and compatible with benign old granulomatous disease.  0.6 5.4 cm lucent lesion in the left first rib possibly a hemangioma or benign lesion.   Patient developed significant infusion reaction to carboplatin with dose 9 when her blood pressure dropped and she became tachycardic and significantly nauseous.  Interval history-reports low back pain for the last 1 week.  Feels that its a little better today but would like to take something to help her function better.  Neuropathy is improving with acupuncture  ECOG PS- 1 Pain scale- 3   Review of systems- Review of Systems  Constitutional:  Positive for malaise/fatigue. Negative for chills, fever and weight loss.  HENT:  Negative for congestion, ear discharge and nosebleeds.   Eyes:  Negative for blurred vision.  Respiratory:  Negative for cough, hemoptysis, sputum production, shortness of breath and wheezing.   Cardiovascular:  Negative for chest pain, palpitations, orthopnea and claudication.  Gastrointestinal:  Negative for abdominal pain, blood in stool, constipation, diarrhea, heartburn, melena, nausea and vomiting.  Genitourinary:  Negative for dysuria, flank pain, frequency, hematuria and urgency.  Musculoskeletal:  Positive for back pain. Negative for joint pain and myalgias.  Skin:  Negative for rash.  Neurological:  Positive for sensory change (Peripheral  neuropathy). Negative for dizziness, tingling, focal weakness, seizures, weakness and headaches.  Endo/Heme/Allergies:  Does not bruise/bleed easily.  Psychiatric/Behavioral:  Negative for depression and suicidal ideas. The patient does not  have insomnia.       Allergies  Allergen Reactions   Carboplatin Shortness Of Breath, Nausea And Vomiting and Other (See Comments)    Flushing- chest , face, neck , arm including hand   Amoxicillin     Other reaction(s): "too young to remember what they do to me"   Sulfa Antibiotics     Other reaction(s): "too young to remember what they do to me"     Past Medical History:  Diagnosis Date   Anxiety    Asthma    Breast cancer ([place]) 11/2020   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 breast ca   Depression    Family history of cancer    Varicose veins of bilateral lower extremities with pain      Past Surgical History:  Procedure Laterality Date   APPENDECTOMY  2018   BREAST BIOPSY Left 11/26/2020   vision 12:00 6cmfn [facility]   BREAST BIOPSY Left 11/26/2020   LN bx, hydro marker,  fragments of macrometastatic carcinoma   PORTACATH PLACEMENT Right 12/13/2020   Procedure: INSERTION PORT-A-CATH;  Surgeon: Herbert Pun, MD;  Location: [facility] ORS;  Service: General;  Laterality: Right;    Social History   Socioeconomic History   Marital status: Married    Spouse name: Not on file   Number of children: Not on file   Years of education: Not on file   Highest education level: Not on file  Occupational History   Not on file  Tobacco Use   Smoking status: Never   Smokeless tobacco: Never  Vaping Use   Vaping Use: Never used  Substance and Sexual Activity   Alcohol use: Yes    Comment: occassinally    Drug use: Yes    Types: Marijuana   Sexual activity: Yes  Other Topics Concern   Not on file  Social History Narrative   Not on file   Social Determinants of Health   Financial Resource Strain: Not on file  Food Insecurity: Not on file  Transportation Needs: Not on file  Physical Activity: Not on file  Stress: Not on file  Social Connections: Not on file  Intimate Partner Violence: Not on file    Family History  Problem Relation Age of Onset   Diabetes Father    Varicose  Veins Father    Diabetes Paternal Aunt    Uterine cancer Paternal Aunt        precancerous   Diabetes Paternal Grandmother    Uterine cancer Paternal Grandmother        precancerous   Thyroid disease Mother    Thyroid disease Maternal Aunt    Thyroid disease Maternal Grandmother    Cancer Other        stomach vs ovarian/cervical   Cancer Paternal Great-grandmother        unk     Current Outpatient Medications:    acetaminophen (TYLENOL) 325 MG tablet, , Disp: , Rfl:    albuterol (VENTOLIN HFA) 108 (90 Base) MCG/ACT inhaler, Inhale 1-2 puffs into the lungs every 6 (six) hours as needed for shortness of breath or wheezing., Disp: , Rfl:    Ascorbic Acid (SUPER C COMPLEX PO), Take 1 capsule by mouth daily., Disp: , Rfl:    busPIRone (BUSPAR) 15 MG tablet, Take 15 mg by mouth 2 (two) times  daily., Disp: , Rfl:    citalopram (CELEXA) 40 MG tablet, Take 1 tablet (40 mg total) by mouth daily., Disp: 90 tablet, Rfl: 2   clonazePAM (KLONOPIN) 0.5 MG tablet, Take 1 tablet (0.5 mg total) by mouth 3 (three) times daily as needed., Disp: 60 tablet, Rfl: 0   dexamethasone (DECADRON) 4 MG tablet, Take 2 tablets (8 mg total) by mouth daily. Start the day after chemotherapy for 2 days., Disp: 30 tablet, Rfl: 1   folic acid (FOLVITE) 1 MG tablet, Take 2 tablets (2 mg total) by mouth daily., Disp: 60 tablet, Rfl: 2   ibuprofen (ADVIL) 200 MG tablet, , Disp: , Rfl:    Leuprolide Acetate, 3 Month, (LUPRON DEPOT, 73-MONTH, IM), Inject into the muscle. Lupron Injection-Every 3 mos, Disp: , Rfl:    lidocaine-prilocaine (EMLA) cream, Apply to affected area once, Disp: 30 g, Rfl: 3   LORazepam (ATIVAN) 0.5 MG tablet, TAKE 1 TABLET (0.5 MG TOTAL) BY MOUTH EVERY 6 (SIX) HOURS AS NEEDED (NAUSEA OR VOMITING)., Disp: 30 tablet, Rfl: 0   norethindrone-ethinyl estradiol (LOESTRIN FE) 1-20 MG-MCG tablet, Take 1 tablet by mouth daily. (Patient not taking: No sig reported), Disp: , Rfl:    OLANZapine (ZYPREXA) 10 MG  tablet, Take 1 tablet (10 mg total) by mouth at bedtime. (Patient not taking: Reported on 04/29/2021), Disp: 30 tablet, Rfl: 0   ondansetron (ZOFRAN) 8 MG tablet, Take 1 tablet (8 mg total) by mouth 2 (two) times daily as needed for refractory nausea / vomiting. Start on day 3 after chemo. (Patient not taking: Reported on 04/29/2021), Disp: 30 tablet, Rfl: 1   prochlorperazine (COMPAZINE) 10 MG tablet, Take 1 tablet (10 mg total) by mouth every 6 (six) hours as needed (Nausea or vomiting). (Patient not taking: Reported on 04/29/2021), Disp: 30 tablet, Rfl: 1   triamcinolone ointment (KENALOG) 0.5 %, Apply 1 application topically 2 (two) times daily. (Patient not taking: Reported on 04/29/2021), Disp: 30 g, Rfl: 0 No current facility-administered medications for this visit.  Facility-Administered Medications Ordered in Other Visits:    heparin lock flush 100 unit/mL, 500 Units, Intravenous, Once, Sindy Guadeloupe, MD  Physical exam:  Vitals:   04/29/21 0907  BP: 111/68  Pulse: (!) 108  Resp: 20  Temp: 98.2 F (36.8 C)  TempSrc: Tympanic  SpO2: 100%  Weight: 195 lb 4.8 oz (88.6 kg)   Physical Exam Cardiovascular:     Rate and Rhythm: Regular rhythm. Tachycardia present.     Heart sounds: Normal heart sounds.  Pulmonary:     Effort: Pulmonary effort is normal.     Breath sounds: Normal breath sounds.  Abdominal:     General: Bowel sounds are normal.     Palpations: Abdomen is soft.  Skin:    General: Skin is warm and dry.  Neurological:     Mental Status: She is alert and oriented to person, place, and time.     CMP Latest Ref Rng & Units 04/08/2021  Glucose 70 - 99 mg/dL 96  BUN 6 - 20 mg/dL 11  Creatinine 0.44 - 1.00 mg/dL 0.57  Sodium 135 - 145 mmol/L 136  Potassium 3.5 - 5.1 mmol/L 4.1  Chloride 98 - 111 mmol/L 101  CO2 22 - 32 mmol/L 27  Calcium 8.9 - 10.3 mg/dL 9.3  Total Protein 6.5 - 8.1 g/dL 7.1  Total Bilirubin 0.3 - 1.2 mg/dL 0.5  Alkaline Phos 38 - 126 U/L 78  AST 15  - 41 U/L 22  ALT 0 - 44 U/L 27   CBC Latest Ref Rng & Units 04/08/2021  WBC 4.0 - 10.5 K/uL 6.1  Hemoglobin 12.0 - 15.0 g/dL 11.0(L)  Hematocrit 36.0 - 46.0 % 30.7(L)  Platelets 150 - 400 K/uL 350      Assessment and plan- Patient is a 30 y.o. female with stage IIIb triple negative breast cancer of the left breast cT2 N1 M0.  She is s/p CarboTaxol chemotherapy with Keytruda neoadjuvant.  She is here for on treatment assessment prior to cycle 3 of neoadjuvant AC Keytruda chemotherapy  Counts okay to proceed with cycle 3 of neoadjuvant AC Keytruda chemotherapy today but I will hold off on pro Neulasta support given that this is being given every 3 weeks and patient reports significant discomfort from on pro Neulasta.  I will see her back in 3 weeks for cycle 4 which will be her last cycle of chemotherapy.  She is already met with Dr. Marla Roe from plastic surgery as well and tentative plan for surgery is on 06/12/2021.  Patient will definitely be undergoing left mastectomy with reconstruction and will also need an axillary lymph node dissection given that she had palpable adenopathy in her left breast.  With regards to her right breast she had an MRI of her bilateral breast which did show normal mass enhancement in the right breast that was biopsy proven benign and the results were deemed concordant.  As such from a medical standpoint patient does not need a right mastectomy as it would not reduce her chances of future recurrence.  Her genetic testing was also negative.  However the patient wishes to proceed with bilateral mastectomy from a psychological and cosmetic standpoint that would be entirely reasonable  Chemo induced anemia: She was found to have low B12 and folate levels as well.  She will receive a B12 shot today and again next time.  She is on oral folate supplements.  Hemoglobin overall stable  Chemo induced nausea: Presently stable and patient has not started taking Zyprexa.  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  Patient presently not on medications  Patient received Lupron 11.25 mg in June 2022.  She will not require any further doses as it was primarily given during chemotherapy for ovarian suppression.  She has triple negative breast cancer and therefore does not require continued ovarian suppression after chemo  Low back pain- will add flexeril. If symptoms persist, will consider imaging   Visit Diagnosis 1. Encounter for antineoplastic chemotherapy   2. Chemotherapy-induced nausea   3. Chemotherapy-induced peripheral neuropathy ([place])   4. Antineoplastic chemotherapy induced anemia   5. Invasive carcinoma of breast ([place])      Dr. Randa Evens, MD, MPH [facility] at [facility] [phone] 04/29/2021 11:36 AM

## 2021-04-29 NOTE — Progress Notes (Signed)
Pt was to get skelaxin but pharmacy called md about possible interaction and it was changed to flexeril. I confirmed with pharmacy that pt only getting flexeril and the skelaxin was d/c and it was per pharmacist at [facility]

## 2021-04-29 NOTE — Telephone Encounter (Signed)
Patient called reporting that pharmacy has refused to fill the muscle relaxer prescription sent in this morning stating there are too many drug interactions for it to be given to her. They recommend Cyclobenzaprine be ordered instead per patient. Please advise

## 2021-04-29 NOTE — Patient Instructions (Signed)
[facility]  Discharge Instructions: Thank you for choosing [facility] to provide your oncology and hematology care.  If you have a lab appointment with the [facility], please go directly to the [facility] and check in at the registration area.  Wear comfortable clothing and clothing appropriate for easy access to any Portacath or PICC line.   We strive to give you quality time with your provider. You may need to reschedule your appointment if you arrive late (15 or more minutes).  Arriving late affects you and other patients whose appointments are after yours.  Also, if you miss three or more appointments without notifying the office, you may be dismissed from the clinic at the provider's discretion.      For prescription refill requests, have your pharmacy contact our office and allow 72 hours for refills to be completed.    Today you received the following chemotherapy and/or immunotherapy agents Keytruda, Adriamycin, Cytoxan      To help prevent nausea and vomiting after your treatment, we encourage you to take your nausea medication as directed.  BELOW ARE SYMPTOMS THAT SHOULD BE REPORTED IMMEDIATELY: *FEVER GREATER THAN 100.4 F (38 C) OR HIGHER *CHILLS OR SWEATING *NAUSEA AND VOMITING THAT IS NOT CONTROLLED WITH YOUR NAUSEA MEDICATION *UNUSUAL SHORTNESS OF BREATH *UNUSUAL BRUISING OR BLEEDING *URINARY PROBLEMS (pain or burning when urinating, or frequent urination) *BOWEL PROBLEMS (unusual diarrhea, constipation, pain near the anus) TENDERNESS IN MOUTH AND THROAT WITH OR WITHOUT PRESENCE OF ULCERS (sore throat, sores in mouth, or a toothache) UNUSUAL RASH, SWELLING OR PAIN  UNUSUAL VAGINAL DISCHARGE OR ITCHING   Items with * indicate a potential emergency and should be followed up as soon as possible or go to the Emergency Department if any problems should occur.  Please show the CHEMOTHERAPY ALERT CARD or IMMUNOTHERAPY  ALERT CARD at check-in to the Emergency Department and triage nurse.  Should you have questions after your visit or need to cancel or reschedule your appointment, please contact [facility]  [phone] and follow the prompts.  Office hours are 8:00 a.m. to 4:30 p.m. Monday - Friday. Please note that voicemails left after 4:00 p.m. may not be returned until the following business day.  We are closed weekends and major holidays. You have access to a nurse at all times for urgent questions. Please call the main number to the clinic [phone] and follow the prompts.  For any non-urgent questions, you may also contact your provider using MyChart. We now offer e-Visits for anyone 71 and older to request care online for non-urgent symptoms. For details visit [email or web address].   Also download the MyChart app! Go to the app store, search "MyChart", open the app, select [facility], and log in with your MyChart username and password.  Due to Covid, a mask is required upon entering the hospital/clinic. If you do not have a mask, one will be given to you upon arrival. For doctor visits, patients may have 1 support person aged 39 or older with them. For treatment visits, patients cannot have anyone with them due to current Covid guidelines and our immunocompromised population.   Pembrolizumab injection What is this medication? PEMBROLIZUMAB (pem broe liz ue mab) is a monoclonal antibody. It is used totreat certain types of cancer. This medicine may be used for other purposes; ask your health care provider orpharmacist if you have questions. COMMON BRAND NAME(S): Keytruda What should I tell my care team  before I take this medication? They need to know if you have any of these conditions: autoimmune diseases like Crohn's disease, ulcerative colitis, or lupus have had or planning to have an allogeneic stem cell transplant (uses someone else's stem cells) history of  organ transplant history of chest radiation nervous system problems like myasthenia gravis or Guillain-Barre syndrome an unusual or allergic reaction to pembrolizumab, other medicines, foods, dyes, or preservatives pregnant or trying to get pregnant breast-feeding How should I use this medication? This medicine is for infusion into a vein. It is given by a health careprofessional in a hospital or clinic setting. A special MedGuide will be given to you before each treatment. Be sure to readthis information carefully each time. Talk to your pediatrician regarding the use of this medicine in children. While this drug may be prescribed for children as young as 6 months for selectedconditions, precautions do apply. Overdosage: If you think you have taken too much of this medicine contact apoison control center or emergency room at once. NOTE: This medicine is only for you. Do not share this medicine with others. What if I miss a dose? It is important not to miss your dose. Call your doctor or health careprofessional if you are unable to keep an appointment. What may interact with this medication? Interactions have not been studied. This list may not describe all possible interactions. Give your health care provider a list of all the medicines, herbs, non-prescription drugs, or dietary supplements you use. Also tell them if you smoke, drink alcohol, or use illegaldrugs. Some items may interact with your medicine. What should I watch for while using this medication? Your condition will be monitored carefully while you are receiving thismedicine. You may need blood work done while you are taking this medicine. Do not become pregnant while taking this medicine or for 4 months after stopping it. Women should inform their doctor if they wish to become pregnant or think they might be pregnant. There is a potential for serious side effects to an unborn child. Talk to your health care professional or  pharmacist for more information. Do not breast-feed an infant while taking this medicine orfor 4 months after the last dose. What side effects may I notice from receiving this medication? Side effects that you should report to your doctor or health care professionalas soon as possible: allergic reactions like skin rash, itching or hives, swelling of the face, lips, or tongue bloody or black, tarry breathing problems changes in vision chest pain chills confusion constipation cough diarrhea dizziness or feeling faint or lightheaded fast or irregular heartbeat fever flushing joint pain low blood counts - this medicine may decrease the number of white blood cells, red blood cells and platelets. You may be at increased risk for infections and bleeding. muscle pain muscle weakness pain, tingling, numbness in the hands or feet persistent headache redness, blistering, peeling or loosening of the skin, including inside the mouth signs and symptoms of high blood sugar such as dizziness; dry mouth; dry skin; fruity breath; nausea; stomach pain; increased hunger or thirst; increased urination signs and symptoms of kidney injury like trouble passing urine or change in the amount of urine signs and symptoms of liver injury like dark urine, light-colored stools, loss of appetite, nausea, right upper belly pain, yellowing of the eyes or skin sweating swollen lymph nodes weight loss Side effects that usually do not require medical attention (report to yourdoctor or health care professional if they continue or are bothersome): decreased  appetite hair loss tiredness This list may not describe all possible side effects. Call your doctor for medical advice about side effects. You may report side effects to FDA at1-800-FDA-1088. Where should I keep my medication? This drug is given in a hospital or clinic and will not be stored at home. NOTE: This sheet is a summary. It may not cover all possible  information. If you have questions about this medicine, talk to your doctor, pharmacist, orhealth care provider.  2022 Elsevier/Gold Standard (2019-08-23 21:44:53)  Doxorubicin injection What is this medication? DOXORUBICIN (dox oh ROO bi sin) is a chemotherapy drug. It is used to treat many kinds of cancer like leukemia, lymphoma, neuroblastoma, sarcoma, and Wilms' tumor. It is also used to treat bladder cancer, breast cancer, lungcancer, ovarian cancer, stomach cancer, and thyroid cancer. This medicine may be used for other purposes; ask your health care provider orpharmacist if you have questions. COMMON BRAND NAME(S): Adriamycin, Adriamycin PFS, Adriamycin RDF, Rubex What should I tell my care team before I take this medication? They need to know if you have any of these conditions: heart disease history of low blood counts caused by a medicine liver disease recent or ongoing radiation therapy an unusual or allergic reaction to doxorubicin, other chemotherapy agents, other medicines, foods, dyes, or preservatives pregnant or trying to get pregnant breast-feeding How should I use this medication? This drug is given as an infusion into a vein. It is administered in a hospital or clinic by a specially trained health care professional. If you have pain, swelling, burning or any unusual feeling around the site of your injection,tell your health care professional right away. Talk to your pediatrician regarding the use of this medicine in children.Special care may be needed. Overdosage: If you think you have taken too much of this medicine contact apoison control center or emergency room at once. NOTE: This medicine is only for you. Do not share this medicine with others. What if I miss a dose? It is important not to miss your dose. Call your doctor or health careprofessional if you are unable to keep an appointment. What may interact with this medication? This medicine may interact with the  following medications: 6-mercaptopurine paclitaxel phenytoin St. John's Wort trastuzumab verapamil This list may not describe all possible interactions. Give your health care provider a list of all the medicines, herbs, non-prescription drugs, or dietary supplements you use. Also tell them if you smoke, drink alcohol, or use illegaldrugs. Some items may interact with your medicine. What should I watch for while using this medication? This drug may make you feel generally unwell. This is not uncommon, as chemotherapy can affect healthy cells as well as cancer cells. Report any side effects. Continue your course of treatment even though you feel ill unless yourdoctor tells you to stop. There is a maximum amount of this medicine you should receive throughout your life. The amount depends on the medical condition being treated and your overall health. Your doctor will watch how much of this medicine you receive inyour lifetime. Tell your doctor if you have taken this medicine before. You may need blood work done while you are taking this medicine. Your urine may turn red for a few days after your dose. This is not blood. Ifyour urine is dark or brown, call your doctor. In some cases, you may be given additional medicines to help with side effects.Follow all directions for their use. Call your doctor or health care professional for advice if you get  a fever, chills or sore throat, or other symptoms of a cold or flu. Do not treat yourself. This drug decreases your body's ability to fight infections. Try toavoid being around people who are sick. This medicine may increase your risk to bruise or bleed. Call your doctor orhealth care professional if you notice any unusual bleeding. Talk to your doctor about your risk of cancer. You may be more at risk forcertain types of cancers if you take this medicine. Do not become pregnant while taking this medicine or for 6 months after stopping it. Women should inform  their doctor if they wish to become pregnant or think they might be pregnant. Men should not father a child while taking this medicine and for 6 months after stopping it. There is a potential for serious side effects to an unborn child. Talk to your health care professional or pharmacist for more information. Do not breast-feed an infant while takingthis medicine. This medicine has caused ovarian failure in some women and reduced sperm counts in some men This medicine may interfere with the ability to have a child. Talk with your doctor or health care professional if you are concerned about yourfertility. This medicine may cause a decrease in Co-Enzyme Q-10. You should make sure that you get enough Co-Enzyme Q-10 while you are taking this medicine. Discuss thefoods you eat and the vitamins you take with your health care professional. What side effects may I notice from receiving this medication? Side effects that you should report to your doctor or health care professionalas soon as possible: allergic reactions like skin rash, itching or hives, swelling of the face, lips, or tongue breathing problems chest pain fast or irregular heartbeat low blood counts - this medicine may decrease the number of white blood cells, red blood cells and platelets. You may be at increased risk for infections and bleeding. pain, redness, or irritation at site where injected signs of infection - fever or chills, cough, sore throat, pain or difficulty passing urine signs of decreased platelets or bleeding - bruising, pinpoint red spots on the skin, black, tarry stools, blood in the urine swelling of the ankles, feet, hands tiredness weakness Side effects that usually do not require medical attention (report to yourdoctor or health care professional if they continue or are bothersome): diarrhea hair loss mouth sores nail discoloration or damage nausea red colored urine vomiting This list may not describe all  possible side effects. Call your doctor for medical advice about side effects. You may report side effects to FDA at1-800-FDA-1088. Where should I keep my medication? This drug is given in a hospital or clinic and will not be stored at home. NOTE: This sheet is a summary. It may not cover all possible information. If you have questions about this medicine, talk to your doctor, pharmacist, orhealth care provider.  2022 Elsevier/Gold Standard (2017-05-05 11:01:26)   Cyclophosphamide Injection What is this medication? CYCLOPHOSPHAMIDE (sye kloe FOSS fa mide) is a chemotherapy drug. It slows the growth of cancer cells. This medicine is used to treat many types of cancer like lymphoma, myeloma, leukemia, breast cancer, and ovarian cancer, to name afew. This medicine may be used for other purposes; ask your health care provider orpharmacist if you have questions. COMMON BRAND NAME(S): Cytoxan, Neosar What should I tell my care team before I take this medication? They need to know if you have any of these conditions: heart disease history of irregular heartbeat infection kidney disease liver disease low blood counts, like white  cells, platelets, or red blood cells on hemodialysis recent or ongoing radiation therapy scarring or thickening of the lungs trouble passing urine an unusual or allergic reaction to cyclophosphamide, other medicines, foods, dyes, or preservatives pregnant or trying to get pregnant breast-feeding How should I use this medication? This drug is usually given as an injection into a vein or muscle or by infusion into a vein. It is administered in a hospital or clinic by a specially trainedhealth care professional. Talk to your pediatrician regarding the use of this medicine in children.Special care may be needed. Overdosage: If you think you have taken too much of this medicine contact apoison control center or emergency room at once. NOTE: This medicine is only for you.  Do not share this medicine with others. What if I miss a dose? It is important not to miss your dose. Call your doctor or health careprofessional if you are unable to keep an appointment. What may interact with this medication? amphotericin B azathioprine certain antivirals for HIV or hepatitis certain medicines for blood pressure, heart disease, irregular heart beat certain medicines that treat or prevent blood clots like warfarin certain other medicines for cancer cyclosporine etanercept indomethacin medicines that relax muscles for surgery medicines to increase blood counts metronidazole This list may not describe all possible interactions. Give your health care provider a list of all the medicines, herbs, non-prescription drugs, or dietary supplements you use. Also tell them if you smoke, drink alcohol, or use illegaldrugs. Some items may interact with your medicine. What should I watch for while using this medication? Your condition will be monitored carefully while you are receiving thismedicine. You may need blood work done while you are taking this medicine. Drink water or other fluids as directed. Urinate often, even at night. Some products may contain alcohol. Ask your health care professional if this medicine contains alcohol. Be sure to tell all health care professionals you are taking this medicine. Certain medicines, like metronidazole and disulfiram, can cause an unpleasant reaction when taken with alcohol. The reaction includes flushing, headache, nausea, vomiting, sweating, and increased thirst. Thereaction can last from 30 minutes to several hours. Do not become pregnant while taking this medicine or for 1 year after stopping it. Women should inform their health care professional if they wish to become pregnant or think they might be pregnant. Men should not father a child while taking this medicine and for 4 months after stopping it. There is potential for serious side  effects to an unborn child. Talk to your health care professionalfor more information. Do not breast-feed an infant while taking this medicine or for 1 week afterstopping it. This medicine has caused ovarian failure in some women. This medicine may make it more difficult to get pregnant. Talk to your health care professional if [place] concerned about your fertility. This medicine has caused decreased sperm counts in some men. This may make it more difficult to father a child. Talk to your health care professional if [place] concerned about your fertility. Call your health care professional for advice if you get a fever, chills, or sore throat, or other symptoms of a cold or flu. Do not treat yourself. This medicine decreases your body's ability to fight infections. Try to avoid beingaround people who are sick. Avoid taking medicines that contain aspirin, acetaminophen, ibuprofen, naproxen, or ketoprofen unless instructed by your health care professional.These medicines may hide a fever. Talk to your health care professional about your risk of cancer. You may bemore at  risk for certain types of cancer if you take this medicine. If you are going to need surgery or other procedure, tell your health careprofessional that you are using this medicine. Be careful brushing or flossing your teeth or using a toothpick because you may get an infection or bleed more easily. If you have any dental work done, Primary school teacher you are receiving this medicine. What side effects may I notice from receiving this medication? Side effects that you should report to your doctor or health care professionalas soon as possible: allergic reactions like skin rash, itching or hives, swelling of the face, lips, or tongue breathing problems nausea, vomiting signs and symptoms of bleeding such as bloody or black, tarry stools; red or dark brown urine; spitting up blood or brown material that looks like coffee grounds; red spots on  the skin; unusual bruising or bleeding from the eyes, gums, or nose signs and symptoms of heart failure like fast, irregular heartbeat, sudden weight gain; swelling of the ankles, feet, hands signs and symptoms of infection like fever; chills; cough; sore throat; pain or trouble passing urine signs and symptoms of kidney injury like trouble passing urine or change in the amount of urine signs and symptoms of liver injury like dark yellow or brown urine; general ill feeling or flu-like symptoms; light-colored stools; loss of appetite; nausea; right upper belly pain; unusually weak or tired; yellowing of the eyes or skin Side effects that usually do not require medical attention (report to yourdoctor or health care professional if they continue or are bothersome): confusion decreased hearing diarrhea facial flushing hair loss headache loss of appetite missed menstrual periods signs and symptoms of low red blood cells or anemia such as unusually weak or tired; feeling faint or lightheaded; falls skin discoloration This list may not describe all possible side effects. Call your doctor for medical advice about side effects. You may report side effects to FDA at1-800-FDA-1088. Where should I keep my medication? This drug is given in a hospital or clinic and will not be stored at home. NOTE: This sheet is a summary. It may not cover all possible information. If you have questions about this medicine, talk to your doctor, pharmacist, orhealth care provider.  2022 Elsevier/Gold Standard (2019-06-26 09:53:29)

## 2021-05-01 ENCOUNTER — Encounter: Payer: Self-pay | Admitting: Internal Medicine

## 2021-05-01 ENCOUNTER — Encounter: Payer: Self-pay | Admitting: Oncology

## 2021-05-01 ENCOUNTER — Other Ambulatory Visit: Payer: Self-pay | Admitting: Oncology

## 2021-05-07 ENCOUNTER — Other Ambulatory Visit: Payer: Self-pay | Admitting: General Surgery

## 2021-05-07 ENCOUNTER — Ambulatory Visit: Payer: Self-pay | Admitting: General Surgery

## 2021-05-07 DIAGNOSIS — C773 Secondary and unspecified malignant neoplasm of axilla and upper limb lymph nodes: Secondary | ICD-10-CM

## 2021-05-07 DIAGNOSIS — C50912 Malignant neoplasm of unspecified site of left female breast: Secondary | ICD-10-CM

## 2021-05-09 ENCOUNTER — Telehealth (INDEPENDENT_AMBULATORY_CARE_PROVIDER_SITE_OTHER): Payer: 59 | Admitting: Plastic Surgery

## 2021-05-09 DIAGNOSIS — C50919 Malignant neoplasm of unspecified site of unspecified female breast: Secondary | ICD-10-CM

## 2021-05-12 ENCOUNTER — Ambulatory Visit: Payer: 59

## 2021-05-14 NOTE — Progress Notes (Signed)
Reschedule

## 2021-05-20 ENCOUNTER — Inpatient Hospital Stay: Payer: 59

## 2021-05-20 ENCOUNTER — Ambulatory Visit (INDEPENDENT_AMBULATORY_CARE_PROVIDER_SITE_OTHER): Payer: 59 | Admitting: Physician Assistant

## 2021-05-20 ENCOUNTER — Encounter: Payer: Self-pay | Admitting: Oncology

## 2021-05-20 ENCOUNTER — Other Ambulatory Visit: Payer: Self-pay

## 2021-05-20 ENCOUNTER — Inpatient Hospital Stay: Payer: 59 | Attending: Oncology

## 2021-05-20 ENCOUNTER — Encounter: Payer: Self-pay | Admitting: Physician Assistant

## 2021-05-20 ENCOUNTER — Inpatient Hospital Stay (HOSPITAL_BASED_OUTPATIENT_CLINIC_OR_DEPARTMENT_OTHER): Payer: 59 | Admitting: Oncology

## 2021-05-20 VITALS — BP 99/66 | HR 117 | Ht 69.0 in | Wt 201.4 lb

## 2021-05-20 VITALS — BP 103/61 | HR 108 | Temp 97.1°F | Resp 20 | Wt 200.0 lb

## 2021-05-20 DIAGNOSIS — Z5111 Encounter for antineoplastic chemotherapy: Secondary | ICD-10-CM

## 2021-05-20 DIAGNOSIS — C50812 Malignant neoplasm of overlapping sites of left female breast: Secondary | ICD-10-CM | POA: Diagnosis present

## 2021-05-20 DIAGNOSIS — C50412 Malignant neoplasm of upper-outer quadrant of left female breast: Secondary | ICD-10-CM

## 2021-05-20 DIAGNOSIS — C50919 Malignant neoplasm of unspecified site of unspecified female breast: Secondary | ICD-10-CM

## 2021-05-20 DIAGNOSIS — E538 Deficiency of other specified B group vitamins: Secondary | ICD-10-CM | POA: Insufficient documentation

## 2021-05-20 DIAGNOSIS — Z5112 Encounter for antineoplastic immunotherapy: Secondary | ICD-10-CM | POA: Insufficient documentation

## 2021-05-20 DIAGNOSIS — Z79899 Other long term (current) drug therapy: Secondary | ICD-10-CM | POA: Diagnosis not present

## 2021-05-20 DIAGNOSIS — Z171 Estrogen receptor negative status [ER-]: Secondary | ICD-10-CM

## 2021-05-20 LAB — COMPREHENSIVE METABOLIC PANEL
ALT: 47 U/L — ABNORMAL HIGH (ref 0–44)
AST: 36 U/L (ref 15–41)
Albumin: 4.3 g/dL (ref 3.5–5.0)
Alkaline Phosphatase: 82 U/L (ref 38–126)
Anion gap: 9 (ref 5–15)
BUN: 9 mg/dL (ref 6–20)
CO2: 27 mmol/L (ref 22–32)
Calcium: 9.3 mg/dL (ref 8.9–10.3)
Chloride: 101 mmol/L (ref 98–111)
Creatinine, Ser: 0.72 mg/dL (ref 0.44–1.00)
GFR, Estimated: >60 mL/min (ref >60)
Glucose, Bld: 101 mg/dL — ABNORMAL HIGH (ref 70–99)
Potassium: 3.7 mmol/L (ref 3.5–5.1)
Sodium: 137 mmol/L (ref 135–145)
Total Bilirubin: 0.5 mg/dL (ref 0.3–1.2)
Total Protein: 7.3 g/dL (ref 6.5–8.1)

## 2021-05-20 LAB — CBC WITH DIFFERENTIAL/PLATELET
Abs Immature Granulocytes: 0.59 K/uL — ABNORMAL HIGH (ref 0.00–0.07)
Basophils Absolute: 0.2 K/uL — ABNORMAL HIGH (ref 0.0–0.1)
Basophils Relative: 2 %
Eosinophils Absolute: 0.1 K/uL (ref 0.0–0.5)
Eosinophils Relative: 1 %
HCT: 33.8 % — ABNORMAL LOW (ref 36.0–46.0)
Hemoglobin: 11.8 g/dL — ABNORMAL LOW (ref 12.0–15.0)
Immature Granulocytes: 8 %
Lymphocytes Relative: 18 %
Lymphs Abs: 1.4 K/uL (ref 0.7–4.0)
MCH: 34.8 pg — ABNORMAL HIGH (ref 26.0–34.0)
MCHC: 34.9 g/dL (ref 30.0–36.0)
MCV: 99.7 fL (ref 80.0–100.0)
Monocytes Absolute: 1.3 K/uL — ABNORMAL HIGH (ref 0.1–1.0)
Monocytes Relative: 17 %
Neutro Abs: 4.2 K/uL (ref 1.7–7.7)
Neutrophils Relative %: 54 %
Platelets: 320 K/uL (ref 150–400)
RBC: 3.39 MIL/uL — ABNORMAL LOW (ref 3.87–5.11)
RDW: 12.7 % (ref 11.5–15.5)
Smear Review: ADEQUATE
WBC: 7.7 K/uL (ref 4.0–10.5)
nRBC: 0 % (ref 0.0–0.2)

## 2021-05-20 MED ORDER — SODIUM CHLORIDE 0.9 % IV SOLN
Freq: Once | INTRAVENOUS | Status: AC
  Filled 2021-05-20: qty 250

## 2021-05-20 MED ORDER — SODIUM CHLORIDE 0.9 % IV SOLN
150.0000 mg | Freq: Once | INTRAVENOUS | Status: AC
  Administered 2021-05-20: 150 mg via INTRAVENOUS
  Filled 2021-05-20: qty 150

## 2021-05-20 MED ORDER — CYANOCOBALAMIN 1000 MCG/ML IJ SOLN
1000.0000 ug | INTRAMUSCULAR | Status: DC
  Administered 2021-05-20: 1000 ug via INTRAMUSCULAR
  Filled 2021-05-20: qty 1

## 2021-05-20 MED ORDER — SODIUM CHLORIDE 0.9 % IV SOLN
600.0000 mg/m2 | Freq: Once | INTRAVENOUS | Status: AC
  Administered 2021-05-20: 1240 mg via INTRAVENOUS
  Filled 2021-05-20: qty 50

## 2021-05-20 MED ORDER — SODIUM CHLORIDE 0.9 % IV SOLN
10.0000 mg | Freq: Once | INTRAVENOUS | Status: AC
  Administered 2021-05-20: 10 mg via INTRAVENOUS
  Filled 2021-05-20: qty 10

## 2021-05-20 MED ORDER — HYDROCODONE-ACETAMINOPHEN 5-325 MG PO TABS: 1.0000 | ORAL_TABLET | Freq: Four times a day (QID) | ORAL | 0 refills | Status: AC | PRN

## 2021-05-20 MED ORDER — ONDANSETRON 4 MG PO TBDP: 4.0000 mg | ORAL_TABLET | Freq: Three times a day (TID) | ORAL | 0 refills | Status: DC | PRN

## 2021-05-20 MED ORDER — SODIUM CHLORIDE 0.9 % IV SOLN
200.0000 mg | Freq: Once | INTRAVENOUS | Status: AC
  Administered 2021-05-20: 200 mg via INTRAVENOUS
  Filled 2021-05-20: qty 8

## 2021-05-20 MED ORDER — PALONOSETRON HCL INJECTION 0.25 MG/5ML
0.2500 mg | Freq: Once | INTRAVENOUS | Status: AC
  Administered 2021-05-20: 0.25 mg via INTRAVENOUS
  Filled 2021-05-20: qty 5

## 2021-05-20 MED ORDER — DOXORUBICIN HCL CHEMO IV INJECTION 2 MG/ML
60.0000 mg/m2 | Freq: Once | INTRAVENOUS | Status: AC
  Administered 2021-05-20: 124 mg via INTRAVENOUS
  Filled 2021-05-20: qty 62

## 2021-05-20 MED ORDER — CEPHALEXIN 500 MG PO CAPS: 500.0000 mg | ORAL_CAPSULE | Freq: Four times a day (QID) | ORAL | 0 refills | Status: AC

## 2021-05-20 MED ORDER — HEPARIN SOD (PORK) LOCK FLUSH 100 UNIT/ML IV SOLN
500.0000 [IU] | Freq: Once | INTRAVENOUS | Status: AC | PRN
  Administered 2021-05-20: 500 [IU]
  Filled 2021-05-20: qty 5

## 2021-05-20 NOTE — Patient Instructions (Signed)
[facility]  Discharge Instructions: Thank you for choosing [facility] to provide your oncology and hematology care.  If you have a lab appointment with the [facility], please go directly to the [facility] and check in at the registration area.  Wear comfortable clothing and clothing appropriate for easy access to any Portacath or PICC line.   We strive to give you quality time with your provider. You may need to reschedule your appointment if you arrive late (15 or more minutes).  Arriving late affects you and other patients whose appointments are after yours.  Also, if you miss three or more appointments without notifying the office, you may be dismissed from the clinic at the provider's discretion.      For prescription refill requests, have your pharmacy contact our office and allow 72 hours for refills to be completed.    Today you received the following chemotherapy and/or immunotherapy agents Keytruda, Adriamycin, & Cytoxan      To help prevent nausea and vomiting after your treatment, we encourage you to take your nausea medication as directed.  BELOW ARE SYMPTOMS THAT SHOULD BE REPORTED IMMEDIATELY: *FEVER GREATER THAN 100.4 F (38 C) OR HIGHER *CHILLS OR SWEATING *NAUSEA AND VOMITING THAT IS NOT CONTROLLED WITH YOUR NAUSEA MEDICATION *UNUSUAL SHORTNESS OF BREATH *UNUSUAL BRUISING OR BLEEDING *URINARY PROBLEMS (pain or burning when urinating, or frequent urination) *BOWEL PROBLEMS (unusual diarrhea, constipation, pain near the anus) TENDERNESS IN MOUTH AND THROAT WITH OR WITHOUT PRESENCE OF ULCERS (sore throat, sores in mouth, or a toothache) UNUSUAL RASH, SWELLING OR PAIN  UNUSUAL VAGINAL DISCHARGE OR ITCHING   Items with * indicate a potential emergency and should be followed up as soon as possible or go to the Emergency Department if any problems should occur.  Please show the CHEMOTHERAPY ALERT CARD or IMMUNOTHERAPY  ALERT CARD at check-in to the Emergency Department and triage nurse.  Should you have questions after your visit or need to cancel or reschedule your appointment, please contact [facility]  [phone] and follow the prompts.  Office hours are 8:00 a.m. to 4:30 p.m. Monday - Friday. Please note that voicemails left after 4:00 p.m. may not be returned until the following business day.  We are closed weekends and major holidays. You have access to a nurse at all times for urgent questions. Please call the main number to the clinic [phone] and follow the prompts.  For any non-urgent questions, you may also contact your provider using MyChart. We now offer e-Visits for anyone 35 and older to request care online for non-urgent symptoms. For details visit [email or web address].   Also download the MyChart app! Go to the app store, search "MyChart", open the app, select [facility], and log in with your MyChart username and password.  Due to Covid, a mask is required upon entering the hospital/clinic. If you do not have a mask, one will be given to you upon arrival. For doctor visits, patients may have 1 support person aged 21 or older with them. For treatment visits, patients cannot have anyone with them due to current Covid guidelines and our immunocompromised population.

## 2021-05-20 NOTE — Progress Notes (Signed)
Hematology/Oncology Consult note [facility]  Telephone:([phone] [phone]  Patient Care Team: Juluis Pitch, MD as PCP - General (Family Medicine) Sindy Guadeloupe, MD as Consulting Physician (Hematology and Oncology)   Name of the patient: Vickie Mathis  [id number]  [birth year]   Date of visit: 05/20/21  Diagnosis- locally advanced 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 breast cancer at least T2 N1 M0  Chief complaint/ Reason for visit-on treatment assessment prior to cycle 4 of neoadjuvant AC Keytruda chemotherapy  Heme/Onc history: patient is a 30 year old female who self palpated a left breast mass about 10 months ago and since then it has been slowly growing.  She sought medical attention recently after thinking it was a possible cyst for all this while.She underwent a diagnostic bilateral mammogram and ultrasound which showed a 2.7 cm mass at the 12 o'clock position 6 cm from the nipple.  Ultrasound of the left axilla demonstrates 2 lymph nodes with mild thickened cortices of 4 mm.  There is skin thickening on the lower inner quadrant of the left breast on mammography.  Both the breast mass and the lymph node were biopsied and was positive for invasive mammary carcinoma grade 3.  Lymph node was also suspicious for extracapsular extension.    ER/PR and HER-2 negative   MRI showed irregular enhancing mass at the 12 o'clock position of the left breast measuring 2.5 x 2.3 cm and a linear component extending 2.3 cm anteriorly.  The mass in the anterior linear extension combined measure 4.3 cm.  3.9 cm in the cephalocaudal dimension.  Also an area of clumped non-mass enhancement in the posterior aspect of the lower quadrant of the left breast measuring 2.6 x 0.9 cm which looks suspicious.  2 enlarged left axillary lymph nodes and 2 mildly enlarged internal mammary lymph nodes.  4.3 cm clumped area of non-mass enhancement in the outer quadrant of the right breast.  3 right  axillary lymph nodes with mild cortical thickening.   Patient underwent biopsy of the right breast non-mass enhancement and that was negative for malignancy   CT scan showed mildly enlarged right inguinal lymph nodes nonspecific.  Left upper breast mass with prominent left axillary lymph nodes.  Subcentimeter pulmonary nodules which are calcified and compatible with benign old granulomatous disease.  0.6 5.4 cm lucent lesion in the left first rib possibly a hemangioma or benign lesion.   Patient developed significant infusion reaction to carboplatin with dose 9 when her blood pressure dropped and she became tachycardic and significantly nauseous.    Interval history-patient is tolerating treatment well and other than mild fatigue she denies other complaints.  Neuropathy is stable.  Nausea well controlled.  ECOG PS- 1 Pain scale- 0   Review of systems- Review of Systems  Constitutional:  Positive for malaise/fatigue. Negative for chills, fever and weight loss.  HENT:  Negative for congestion, ear discharge and nosebleeds.   Eyes:  Negative for blurred vision.  Respiratory:  Negative for cough, hemoptysis, sputum production, shortness of breath and wheezing.   Cardiovascular:  Negative for chest pain, palpitations, orthopnea and claudication.  Gastrointestinal:  Negative for abdominal pain, blood in stool, constipation, diarrhea, heartburn, melena, nausea and vomiting.  Genitourinary:  Negative for dysuria, flank pain, frequency, hematuria and urgency.  Musculoskeletal:  Negative for back pain, joint pain and myalgias.  Skin:  Negative for rash.  Neurological:  Negative for dizziness, tingling, focal weakness, seizures, weakness and headaches.  Endo/Heme/Allergies:  Does not bruise/bleed easily.  Psychiatric/Behavioral:  Negative for depression and suicidal ideas. The patient does not have insomnia.       Allergies  Allergen Reactions   Carboplatin Shortness Of Breath, Nausea And  Vomiting and Other (See Comments)    Flushing- chest , face, neck , arm including hand   Amoxicillin     Other reaction(s): "too young to remember what they do to me"   Sulfa Antibiotics     Other reaction(s): "too young to remember what they do to me"     Past Medical History:  Diagnosis Date   Anxiety    Asthma    Breast cancer ([place]) 11/2020   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 breast ca   Depression    Family history of cancer    Varicose veins of bilateral lower extremities with pain      Past Surgical History:  Procedure Laterality Date   APPENDECTOMY  2018   BREAST BIOPSY Left 11/26/2020   vision 12:00 6cmfn [facility]   BREAST BIOPSY Left 11/26/2020   LN bx, hydro marker,  fragments of macrometastatic carcinoma   PORTACATH PLACEMENT Right 12/13/2020   Procedure: INSERTION PORT-A-CATH;  Surgeon: Herbert Pun, MD;  Location: [facility] ORS;  Service: General;  Laterality: Right;    Social History   Socioeconomic History   Marital status: Married    Spouse name: Not on file   Number of children: Not on file   Years of education: Not on file   Highest education level: Not on file  Occupational History   Not on file  Tobacco Use   Smoking status: Never   Smokeless tobacco: Never  Vaping Use   Vaping Use: Never used  Substance and Sexual Activity   Alcohol use: Yes    Comment: occassinally    Drug use: Yes    Types: Marijuana   Sexual activity: Yes  Other Topics Concern   Not on file  Social History Narrative   Not on file   Social Determinants of Health   Financial Resource Strain: Not on file  Food Insecurity: Not on file  Transportation Needs: Not on file  Physical Activity: Not on file  Stress: Not on file  Social Connections: Not on file  Intimate Partner Violence: Not on file    Family History  Problem Relation Age of Onset   Diabetes Father    Varicose Veins Father    Diabetes Paternal Aunt    Uterine cancer Paternal Aunt        precancerous   Diabetes  Paternal Grandmother    Uterine cancer Paternal Grandmother        precancerous   Thyroid disease Mother    Thyroid disease Maternal Aunt    Thyroid disease Maternal Grandmother    Cancer Other        stomach vs ovarian/cervical   Cancer Paternal Great-grandmother        unk     Current Outpatient Medications:    acetaminophen (TYLENOL) 325 MG tablet, , Disp: , Rfl:    albuterol (VENTOLIN HFA) 108 (90 Base) MCG/ACT inhaler, Inhale 1-2 puffs into the lungs every 6 (six) hours as needed for shortness of breath or wheezing., Disp: , Rfl:    Ascorbic Acid (SUPER C COMPLEX PO), Take 1 capsule by mouth daily., Disp: , Rfl:    busPIRone (BUSPAR) 15 MG tablet, Take 15 mg by mouth 2 (two) times daily., Disp: , Rfl:    citalopram (CELEXA) 40 MG tablet, Take 1 tablet (40 mg total) by  mouth daily., Disp: 90 tablet, Rfl: 2   clonazePAM (KLONOPIN) 0.5 MG tablet, Take 1 tablet (0.5 mg total) by mouth 3 (three) times daily as needed., Disp: 60 tablet, Rfl: 0   cyclobenzaprine (FLEXERIL) 5 MG tablet, Take 1 tablet (5 mg total) by mouth 3 (three) times daily as needed for muscle spasms., Disp: 30 tablet, Rfl: 0   dexamethasone (DECADRON) 4 MG tablet, Take 2 tablets (8 mg total) by mouth daily. Start the day after chemotherapy for 2 days., Disp: 30 tablet, Rfl: 1   folic acid (FOLVITE) 1 MG tablet, Take 2 tablets (2 mg total) by mouth daily., Disp: 60 tablet, Rfl: 2   ibuprofen (ADVIL) 200 MG tablet, , Disp: , Rfl:    Leuprolide Acetate, 3 Month, (LUPRON DEPOT, 98-MONTH, IM), Inject into the muscle. Lupron Injection-Every 3 mos, Disp: , Rfl:    lidocaine-prilocaine (EMLA) cream, Apply to affected area once, Disp: 30 g, Rfl: 3   LORazepam (ATIVAN) 0.5 MG tablet, TAKE 1 TABLET (0.5 MG TOTAL) BY MOUTH EVERY 6 (SIX) HOURS AS NEEDED (NAUSEA OR VOMITING)., Disp: 30 tablet, Rfl: 0   LORazepam (ATIVAN) 0.5 MG tablet, Take 0.5 tablets by mouth as needed., Disp: , Rfl:    norethindrone-ethinyl estradiol (LOESTRIN FE)  1-20 MG-MCG tablet, Take 1 tablet by mouth daily. (Patient not taking: No sig reported), Disp: , Rfl:    OLANZapine (ZYPREXA) 10 MG tablet, TAKE 1 TABLET BY MOUTH EVERYDAY AT BEDTIME, Disp: 90 tablet, Rfl: 1   ondansetron (ZOFRAN) 8 MG tablet, Take 1 tablet (8 mg total) by mouth 2 (two) times daily as needed for refractory nausea / vomiting. Start on day 3 after chemo. (Patient not taking: Reported on 04/29/2021), Disp: 30 tablet, Rfl: 1   prochlorperazine (COMPAZINE) 10 MG tablet, Take 1 tablet (10 mg total) by mouth every 6 (six) hours as needed (Nausea or vomiting). (Patient not taking: Reported on 04/29/2021), Disp: 30 tablet, Rfl: 1   triamcinolone ointment (KENALOG) 0.5 %, Apply 1 application topically 2 (two) times daily. (Patient not taking: Reported on 04/29/2021), Disp: 30 g, Rfl: 0 No current facility-administered medications for this visit.  Facility-Administered Medications Ordered in Other Visits:    cyanocobalamin ((VITAMIN B-12)) injection 1,000 mcg, 1,000 mcg, Intramuscular, Weekly, Sindy Guadeloupe, MD, 1,000 mcg at 05/20/21 1018   cyclophosphamide (CYTOXAN) 1,240 mg in sodium chloride 0.9 % 250 mL chemo infusion, 600 mg/m2 (Treatment Plan Recorded), Intravenous, Once, Sindy Guadeloupe, MD   DOXOrubicin (ADRIAMYCIN) chemo injection 124 mg, 60 mg/m2 (Treatment Plan Recorded), Intravenous, Once, Sindy Guadeloupe, MD  Physical exam:  Vitals:   05/20/21 0907  BP: 103/61  Pulse: (!) 108  Resp: 20  Temp: (!) 97.1 F (36.2 C)  TempSrc: Tympanic  SpO2: 99%  Weight: 200 lb (90.7 kg)   Physical Exam Constitutional:      General: She is not in acute distress. Cardiovascular:     Rate and Rhythm: Normal rate and regular rhythm.     Heart sounds: Normal heart sounds.  Pulmonary:     Effort: Pulmonary effort is normal.     Breath sounds: Normal breath sounds.  Abdominal:     General: Bowel sounds are normal.     Palpations: Abdomen is soft.  Skin:    General: Skin is warm and dry.   Neurological:     Mental Status: She is alert and oriented to person, place, and time.     CMP Latest Ref Rng & Units 05/20/2021  Glucose 70 -  99 mg/dL 101(H)  BUN 6 - 20 mg/dL 9  Creatinine 0.44 - 1.00 mg/dL 0.72  Sodium 135 - 145 mmol/L 137  Potassium 3.5 - 5.1 mmol/L 3.7  Chloride 98 - 111 mmol/L 101  CO2 22 - 32 mmol/L 27  Calcium 8.9 - 10.3 mg/dL 9.3  Total Protein 6.5 - 8.1 g/dL 7.3  Total Bilirubin 0.3 - 1.2 mg/dL 0.5  Alkaline Phos 38 - 126 U/L 82  AST 15 - 41 U/L 36  ALT 0 - 44 U/L 47(H)   CBC Latest Ref Rng & Units 05/20/2021  WBC 4.0 - 10.5 K/uL 7.7  Hemoglobin 12.0 - 15.0 g/dL 11.8(L)  Hematocrit 36.0 - 46.0 % 33.8(L)  Platelets 150 - 400 K/uL 320     Assessment and plan- Patient is a 30 y.o. female with stage IIIb triple negative breast cancer of the left breast cT2 N1 M0.  She is s/p CarboTaxol chemotherapy with Keytruda neoadjuvant.  She is here for on treatment assessment prior to cycle 4 of neoadjuvant AC Keytruda chemotherapy  Counts okay to proceed with cycle 4 of neoadjuvant AC Keytruda chemotherapy today.  This would be her last chemotherapy.  I am not giving her on pro Neulasta with this cycle.  She is scheduled for bilateral mastectomy with reconstruction on 06/12/2021.  I will see her in 5 weeks from now after her surgery to discuss final pathology results and further management.  Following surgery per keynote 522 protocol she will continue to receive adjuvant Keytruda every 3 weeks for 9 cycles.  If there is any evidence of residual disease role of Xeloda has not been studied in the setting of keynote 522 regimen.  Patient would also benefit from adjuvant radiation treatment following surgery     Visit Diagnosis 1. Encounter for antineoplastic chemotherapy   2. Invasive carcinoma of breast ([place])      Dr. Randa Evens, MD, MPH [facility] at [facility] [phone] 05/20/2021 12:13 PM

## 2021-05-20 NOTE — Progress Notes (Signed)
HR 108. Per Dr. Janese Banks, okay to proceed with treatment. No On Pro today but patient will receive B12 injection in addition to treatment.

## 2021-05-20 NOTE — Progress Notes (Signed)
Patient ID: Vickie Mathis, female    DOB: 11/26/1990, 30 y.o.   MRN: [id number]  No chief complaint on file.   No diagnosis found.   History of Present Illness: Vickie Mathis is a 30 y.o.  female  with a history of stage IIIb triple negative breast cancer of left breast.  She presents for preoperative evaluation for upcoming procedure, immediate bilateral breast reconstruction with expander and Flex HD placement, scheduled for 06/12/2021 with Dr. Marla Roe.  The patient has not had problems with anesthesia. She states that she has had anesthesia in past without complication.  Summary of Previous Visit: Patient is followed by oncology, Dr. Janese Banks, and has received 4 cycles of neoadjuvant AC Keytruda chemotherapy.  She plans to continue with adjuvant Keytruda every 3 weeks for 9 cycles subsequent to surgery.  Her bilateral total mastectomy with left axillary lymph node dissection will be performed by her general surgeon, Dr. Windell Moment, immediately prior to surgical intervention performed by Dr. Marla Roe.    Job: Post office, physically demanding. Oncology handling FMLA paperwork.  Has not worked x 6 months.  PMH Significant for: Stage IIIb triple negative breast cancer.   No changes in interim health.  Endorses hot flashes which she attributes to chemotherapy.  Denies fevers, redness, chest pain, SOB, recent leg swelling, cough, other symptoms.  Past Medical History: Allergies: Allergies  Allergen Reactions   Carboplatin Shortness Of Breath, Nausea And Vomiting and Other (See Comments)    Flushing- chest , face, neck , arm including hand   Amoxicillin     Other reaction(s): "too young to remember what they do to me"   Sulfa Antibiotics     Other reaction(s): "too young to remember what they do to me"    Current Medications:  Current Outpatient Medications:    acetaminophen (TYLENOL) 325 MG tablet, , Disp: , Rfl:    albuterol (VENTOLIN HFA) 108 (90 Base) MCG/ACT inhaler,  Inhale 1-2 puffs into the lungs every 6 (six) hours as needed for shortness of breath or wheezing., Disp: , Rfl:    Ascorbic Acid (SUPER C COMPLEX PO), Take 1 capsule by mouth daily., Disp: , Rfl:    busPIRone (BUSPAR) 15 MG tablet, Take 15 mg by mouth 2 (two) times daily., Disp: , Rfl:    citalopram (CELEXA) 40 MG tablet, Take 1 tablet (40 mg total) by mouth daily., Disp: 90 tablet, Rfl: 2   clonazePAM (KLONOPIN) 0.5 MG tablet, Take 1 tablet (0.5 mg total) by mouth 3 (three) times daily as needed., Disp: 60 tablet, Rfl: 0   cyclobenzaprine (FLEXERIL) 5 MG tablet, Take 1 tablet (5 mg total) by mouth 3 (three) times daily as needed for muscle spasms., Disp: 30 tablet, Rfl: 0   dexamethasone (DECADRON) 4 MG tablet, Take 2 tablets (8 mg total) by mouth daily. Start the day after chemotherapy for 2 days., Disp: 30 tablet, Rfl: 1   folic acid (FOLVITE) 1 MG tablet, Take 2 tablets (2 mg total) by mouth daily., Disp: 60 tablet, Rfl: 2   ibuprofen (ADVIL) 200 MG tablet, , Disp: , Rfl:    Leuprolide Acetate, 3 Month, (LUPRON DEPOT, 53-MONTH, IM), Inject into the muscle. Lupron Injection-Every 3 mos, Disp: , Rfl:    lidocaine-prilocaine (EMLA) cream, Apply to affected area once, Disp: 30 g, Rfl: 3   LORazepam (ATIVAN) 0.5 MG tablet, TAKE 1 TABLET (0.5 MG TOTAL) BY MOUTH EVERY 6 (SIX) HOURS AS NEEDED (NAUSEA OR VOMITING)., Disp: 30 tablet, Rfl: 0  LORazepam (ATIVAN) 0.5 MG tablet, Take 0.5 tablets by mouth as needed., Disp: , Rfl:    norethindrone-ethinyl estradiol (LOESTRIN FE) 1-20 MG-MCG tablet, Take 1 tablet by mouth daily. (Patient not taking: No sig reported), Disp: , Rfl:    OLANZapine (ZYPREXA) 10 MG tablet, TAKE 1 TABLET BY MOUTH EVERYDAY AT BEDTIME, Disp: 90 tablet, Rfl: 1   ondansetron (ZOFRAN) 8 MG tablet, Take 1 tablet (8 mg total) by mouth 2 (two) times daily as needed for refractory nausea / vomiting. Start on day 3 after chemo. (Patient not taking: Reported on 04/29/2021), Disp: 30 tablet, Rfl:  1   prochlorperazine (COMPAZINE) 10 MG tablet, Take 1 tablet (10 mg total) by mouth every 6 (six) hours as needed (Nausea or vomiting). (Patient not taking: Reported on 04/29/2021), Disp: 30 tablet, Rfl: 1   triamcinolone ointment (KENALOG) 0.5 %, Apply 1 application topically 2 (two) times daily. (Patient not taking: Reported on 04/29/2021), Disp: 30 g, Rfl: 0 No current facility-administered medications for this visit.  Facility-Administered Medications Ordered in Other Visits:    cyanocobalamin ((VITAMIN B-12)) injection 1,000 mcg, 1,000 mcg, Intramuscular, Weekly, Sindy Guadeloupe, MD, 1,000 mcg at 05/20/21 1018  Past Medical Problems: Past Medical History:  Diagnosis Date   Anxiety    Asthma    Breast cancer ([place]) 11/2020   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 breast ca   Depression    Family history of cancer    Varicose veins of bilateral lower extremities with pain     Past Surgical History: Past Surgical History:  Procedure Laterality Date   APPENDECTOMY  2018   BREAST BIOPSY Left 11/26/2020   vision 12:00 6cmfn [facility]   BREAST BIOPSY Left 11/26/2020   LN bx, hydro marker,  fragments of macrometastatic carcinoma   PORTACATH PLACEMENT Right 12/13/2020   Procedure: INSERTION PORT-A-CATH;  Surgeon: Herbert Pun, MD;  Location: [facility] ORS;  Service: General;  Laterality: Right;    Social History: Social History   Socioeconomic History   Marital status: Married    Spouse name: Not on file   Number of children: Not on file   Years of education: Not on file   Highest education level: Not on file  Occupational History   Not on file  Tobacco Use   Smoking status: Never   Smokeless tobacco: Never  Vaping Use   Vaping Use: Never used  Substance and Sexual Activity   Alcohol use: Yes    Comment: occassinally    Drug use: Yes    Types: Marijuana   Sexual activity: Yes  Other Topics Concern   Not on file  Social History Narrative   Not on file   Social Determinants of Health    Financial Resource Strain: Not on file  Food Insecurity: Not on file  Transportation Needs: Not on file  Physical Activity: Not on file  Stress: Not on file  Social Connections: Not on file  Intimate Partner Violence: Not on file    Family History: Family History  Problem Relation Age of Onset   Diabetes Father    Varicose Veins Father    Diabetes Paternal Aunt    Uterine cancer Paternal Aunt        precancerous   Diabetes Paternal Grandmother    Uterine cancer Paternal Grandmother        precancerous   Thyroid disease Mother    Thyroid disease Maternal Aunt    Thyroid disease Maternal Grandmother    Cancer Other  stomach vs ovarian/cervical   Cancer Paternal Great-grandmother        unk    Review of Systems: Review of Systems  Constitutional:  Negative for fever.  Respiratory:  Negative for shortness of breath.   Cardiovascular:  Negative for chest pain.   Physical Exam: Vital Signs There were no vitals taken for this visit.  Physical Exam Constitutional:      General: Not in acute distress.    Appearance: Normal appearance. Not ill-appearing.  HENT:     Head: Normocephalic and atraumatic.  Eyes:     Pupils: Pupils are equal, round Neck:     Musculoskeletal: Normal range of motion.  Cardiovascular:     Rate and Rhythm: Normal rate    Pulses: Normal pulses.  Pulmonary:     Effort: Pulmonary effort is normal. No respiratory distress.  Musculoskeletal: Normal range of motion. No leg swelling.  No edema. Skin:    General: Skin is warm and dry. No obvious varicosities.      Findings: No erythema or rash.  Neurological:     General: No focal deficit present.     Mental Status: Alert and oriented to person, place, and time. Mental status is at baseline.     Motor: No weakness.  Psychiatric:        Mood and Affect: Mood normal.        Behavior: Behavior normal.    Assessment/Plan: The patient is scheduled for 06/12/2021 with Dr. Marla Roe.   Risks, benefits, and alternatives of procedure discussed, questions answered and consent obtained.    Smoking Status: Non-smoker.   Last Mammogram: 11/12/2020; Results: Highly suspicious 2.7 cm mass in left breast at 12 o'clock.  No radiographic evidence of malignancy in right breast. BI-RADS category 5.   Caprini Score: 7; Risk Factors include: gender, hx of malignancy, currently on chemotherapy, history of varicosities, BMI greater than 25, and length of planned surgery. Recommendation for mechanical prophylaxis. Encourage early ambulation. Patient is on Lupron, hormone suppression rather than replacement.  Pictures obtained: 04/15/2021.   Post-op Rx sent to pharmacy: Yes. Norco, Keflex, Zofran.  Patient was provided with the General Surgical Risk consent document and Pain Medication Agreement prior to their appointment.  They had adequate time to read through the risk consent documents and Pain Medication Agreement. We also discussed them in person together during this preop appointment. All of their questions were answered to their satisfaction.  Recommended calling if they have any further questions.  Risk consent form and Pain Medication Agreement to be scanned into patient's chart.  The risks that can be encountered with and after placement of a breast expander placement were discussed and include the following but not limited to these: bleeding, infection, delayed healing, anesthesia risks, skin sensation changes, injury to structures including nerves, blood vessels, and muscles which may be temporary or permanent, allergies to tape, suture materials and glues, blood products, topical preparations or injected agents, skin contour irregularities, skin discoloration and swelling, deep vein thrombosis, cardiac and pulmonary complications, pain, which may persist, fluid accumulation, wrinkling of the skin over the expander, changes in nipple or breast sensation, expander leakage or rupture, faulty  position of the expander, persistent pain, formation of tight scar tissue around the expander (capsular contracture), possible need for revisional surgery or staged procedures.     Electronically signed by: Krista Blue, PA-C 05/20/2021 2:11 PM

## 2021-05-21 ENCOUNTER — Telehealth: Payer: Self-pay

## 2021-05-21 NOTE — Telephone Encounter (Signed)
Faxed referral for mastectomy supplies to [facility] [employer]

## 2021-05-23 ENCOUNTER — Other Ambulatory Visit: Payer: Self-pay | Admitting: Oncology

## 2021-05-29 ENCOUNTER — Other Ambulatory Visit: Payer: Self-pay | Admitting: Oncology

## 2021-06-02 ENCOUNTER — Encounter
Admission: RE | Admit: 2021-06-02 | Discharge: 2021-06-02 | Disposition: A | Discharge status: Home / Self Care | Payer: 59 | Source: Ambulatory Visit | Attending: General Surgery | Admitting: General Surgery

## 2021-06-02 ENCOUNTER — Other Ambulatory Visit: Payer: Self-pay

## 2021-06-02 HISTORY — DX: Personal history of antineoplastic chemotherapy: Z92.21

## 2021-06-02 NOTE — Patient Instructions (Signed)
Your procedure is scheduled on:06-12-21 Thursday Report to the Radiology Desk (2nd desk on right) located in the [facility] @ 8 AM To find out your arrival time, please call [phone] between 1PM - 3PM on:06-11-21 Wednesday  REMEMBER: Instructions that are not followed completely may result in serious medical risk, up to and including death; or upon the discretion of your surgeon and anesthesiologist your surgery may need to be rescheduled.  Do not eat food after midnight the night before surgery.  No gum chewing, lozengers or hard candies.  You may however, drink CLEAR liquids up to 2 hours before you are scheduled to arrive for your surgery. Do not drink anything within 2 hours of your scheduled arrival time.  Clear liquids include: - water  - apple juice without pulp - gatorade - black coffee or tea (Do NOT add milk or creamers to the coffee or tea) Do NOT drink anything that is not on this list.  TAKE THESE MEDICATIONS THE MORNING OF SURGERY WITH A SIP OF WATER: -Buspar (Buspirone) -You may take Klonopin (Clonazepam) the day of surgery if needed for anxiety  One week prior to surgery: Stop Anti-inflammatories (NSAIDS) such as Advil, Aleve, Ibuprofen, Motrin, Naproxen, Naprosyn and Aspirin based products such as Excedrin, Goodys Powder, BC Powder.You may however, continue to take Tylenol if needed for pain up until the day of surgery.  Stop ANY OVER THE COUNTER supplements/vitamins 7 days prior to surgery (Vitamin B12, multivitamin, Folic Acid, Turmeric Curcumin)  No Alcohol for 24 hours before or after surgery.  No Smoking including e-cigarettes for 24 hours prior to surgery.  No chewable tobacco products for at least 6 hours prior to surgery.  No nicotine patches on the day of surgery.  Do not use any "recreational" drugs for at least a week prior to your surgery.  Please be advised that the combination of cocaine and anesthesia may have negative outcomes, up to and  including death. If you test positive for cocaine, your surgery will be cancelled.  On the morning of surgery brush your teeth with toothpaste and water, you may rinse your mouth with mouthwash if you wish. Do not swallow any toothpaste or mouthwash.  Do not wear jewelry, make-up, hairpins, clips or nail polish.  Do not wear lotions, powders, or perfumes.   Do not shave body from the neck down 48 hours prior to surgery just in case you cut yourself which could leave a site for infection.  Also, freshly shaved skin may become irritated if using the CHG soap.  Contact lenses, hearing aids and dentures may not be worn into surgery.  Do not bring valuables to the hospital. [facility] is not responsible for any missing/lost belongings or valuables.   Use CHG Soap as directed on instruction sheet.  Notify your doctor if there is any change in your medical condition (cold, fever, infection).  Wear comfortable clothing (specific to your surgery type) to the hospital.  After surgery, you can help prevent lung complications by doing breathing exercises.  Take deep breaths and cough every 1-2 hours. Your doctor may order a device called an Incentive Spirometer to help you take deep breaths. When coughing or sneezing, hold a pillow firmly against your incision with both hands. This is called "splinting." Doing this helps protect your incision. It also decreases belly discomfort.  If you are being admitted to the hospital overnight, leave your suitcase in the car. After surgery it may be brought to your room.  If you are being discharged the day of surgery, you will not be allowed to drive home. You will need a responsible adult (18 years or older) to drive you home and stay with you that night.   If you are taking public transportation, you will need to have a responsible adult (18 years or older) with you. Please confirm with your physician that it is acceptable to use public transportation.    Please call the [facility] at [phone] if you have any questions about these instructions.  Surgery Visitation Policy:  Patients undergoing a surgery or procedure may have one family member or support person with them as long as that person is not COVID-19 positive or experiencing its symptoms.  That person may remain in the waiting area during the procedure.  Inpatient Visitation:    Visiting hours are 7 a.m. to 8 p.m. Inpatients will be allowed two visitors daily. The visitors may change each day during the patient's stay. No visitors under the age of 61. Any visitor under the age of 23 must be accompanied by an adult. The visitor must pass COVID-19 screenings, use hand sanitizer when entering and exiting the patient's room and wear a mask at all times, including in the patient's room. Patients must also wear a mask when staff or their visitor are in the room. Masking is required regardless of vaccination status.

## 2021-06-03 ENCOUNTER — Encounter: Payer: Self-pay | Admitting: Oncology

## 2021-06-03 ENCOUNTER — Encounter: Payer: Self-pay | Admitting: Internal Medicine

## 2021-06-04 ENCOUNTER — Telehealth: Payer: Self-pay | Admitting: Plastic Surgery

## 2021-06-04 ENCOUNTER — Other Ambulatory Visit: Payer: Self-pay | Admitting: General Surgery

## 2021-06-04 ENCOUNTER — Ambulatory Visit
Admission: RE | Admit: 2021-06-04 | Discharge: 2021-06-04 | Disposition: A | Discharge status: Home / Self Care | Payer: 59 | Source: Ambulatory Visit | Attending: General Surgery | Admitting: General Surgery

## 2021-06-04 ENCOUNTER — Other Ambulatory Visit: Payer: Self-pay

## 2021-06-04 DIAGNOSIS — C773 Secondary and unspecified malignant neoplasm of axilla and upper limb lymph nodes: Secondary | ICD-10-CM | POA: Insufficient documentation

## 2021-06-04 DIAGNOSIS — C50912 Malignant neoplasm of unspecified site of left female breast: Secondary | ICD-10-CM

## 2021-06-04 IMAGING — MG MM DIGITAL DIAGNOSTIC UNILAT*L* W/ TOMO W/ CAD
2 series · 3 of 6 positions shown · non-contrast
Comparison: Previous exam(s).

CLINICAL DATA: Post procedure mammogram to confirm RF tag
placement.

EXAM:
3D DIAGNOSTIC LEFT MAMMOGRAM POST RF TAG PLACEMENT

[L MLO synth-2D]
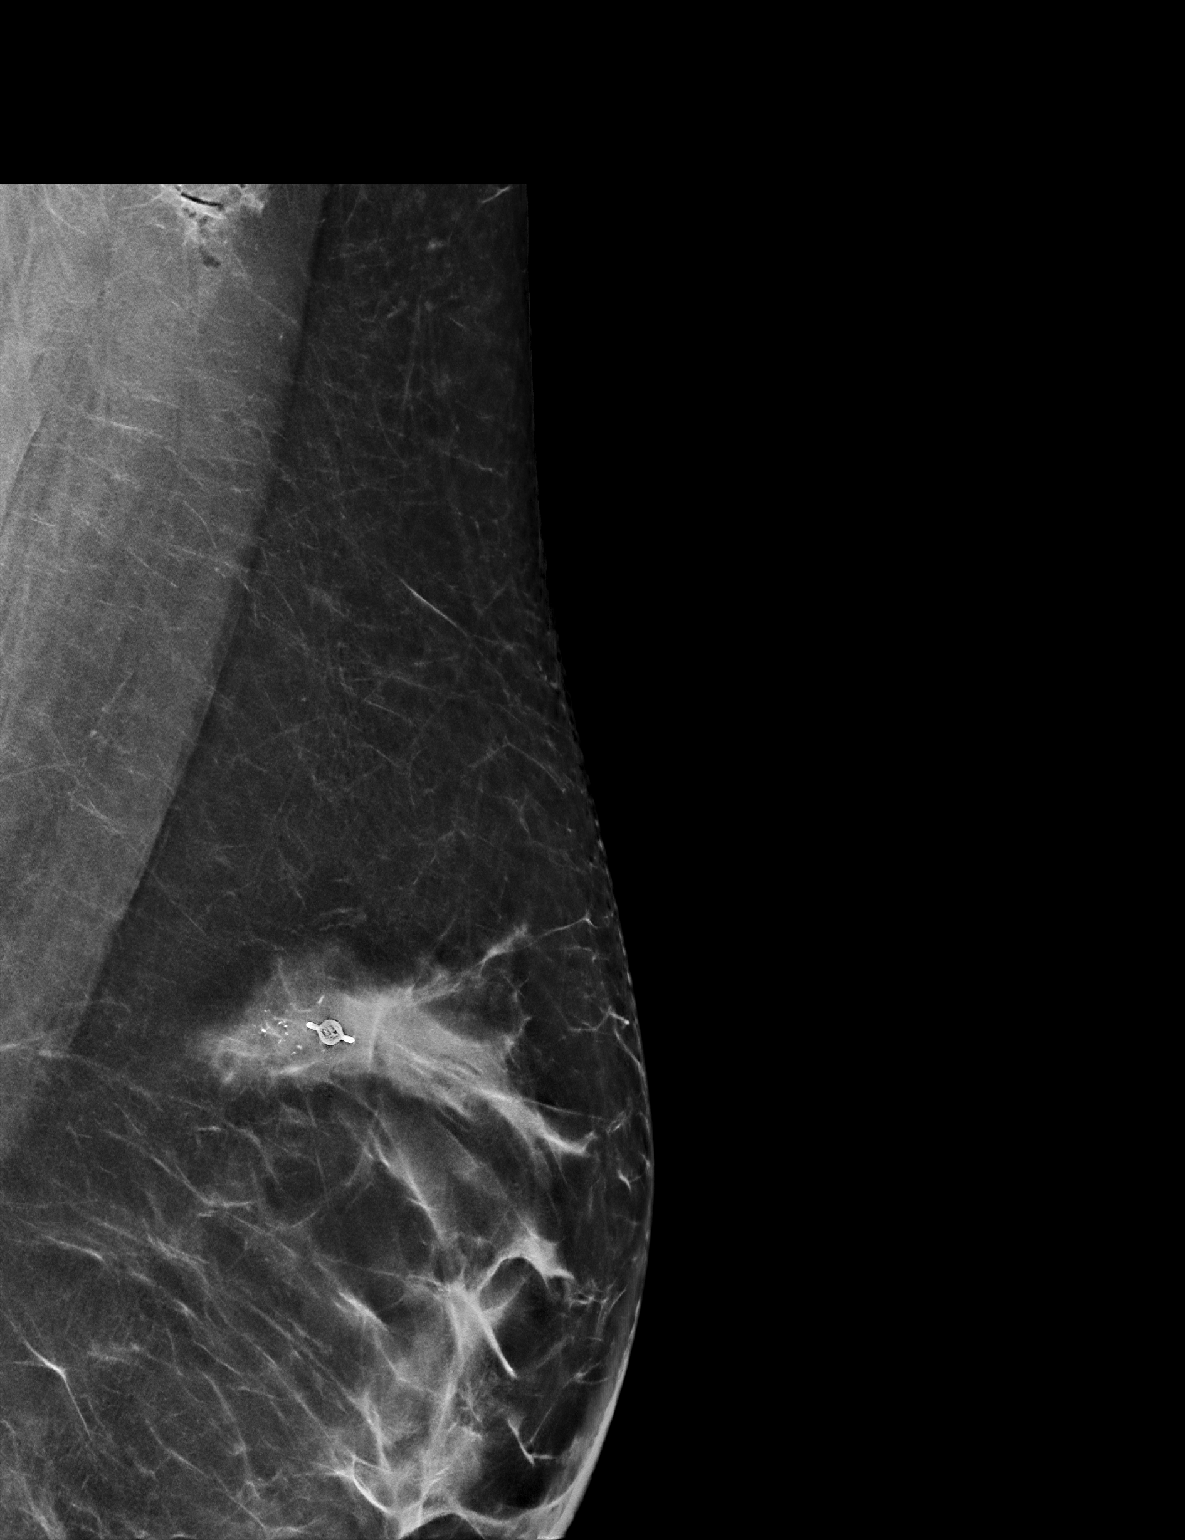

[L MLO tomo · 2 of 83 frames shown]
[frame 27/83]
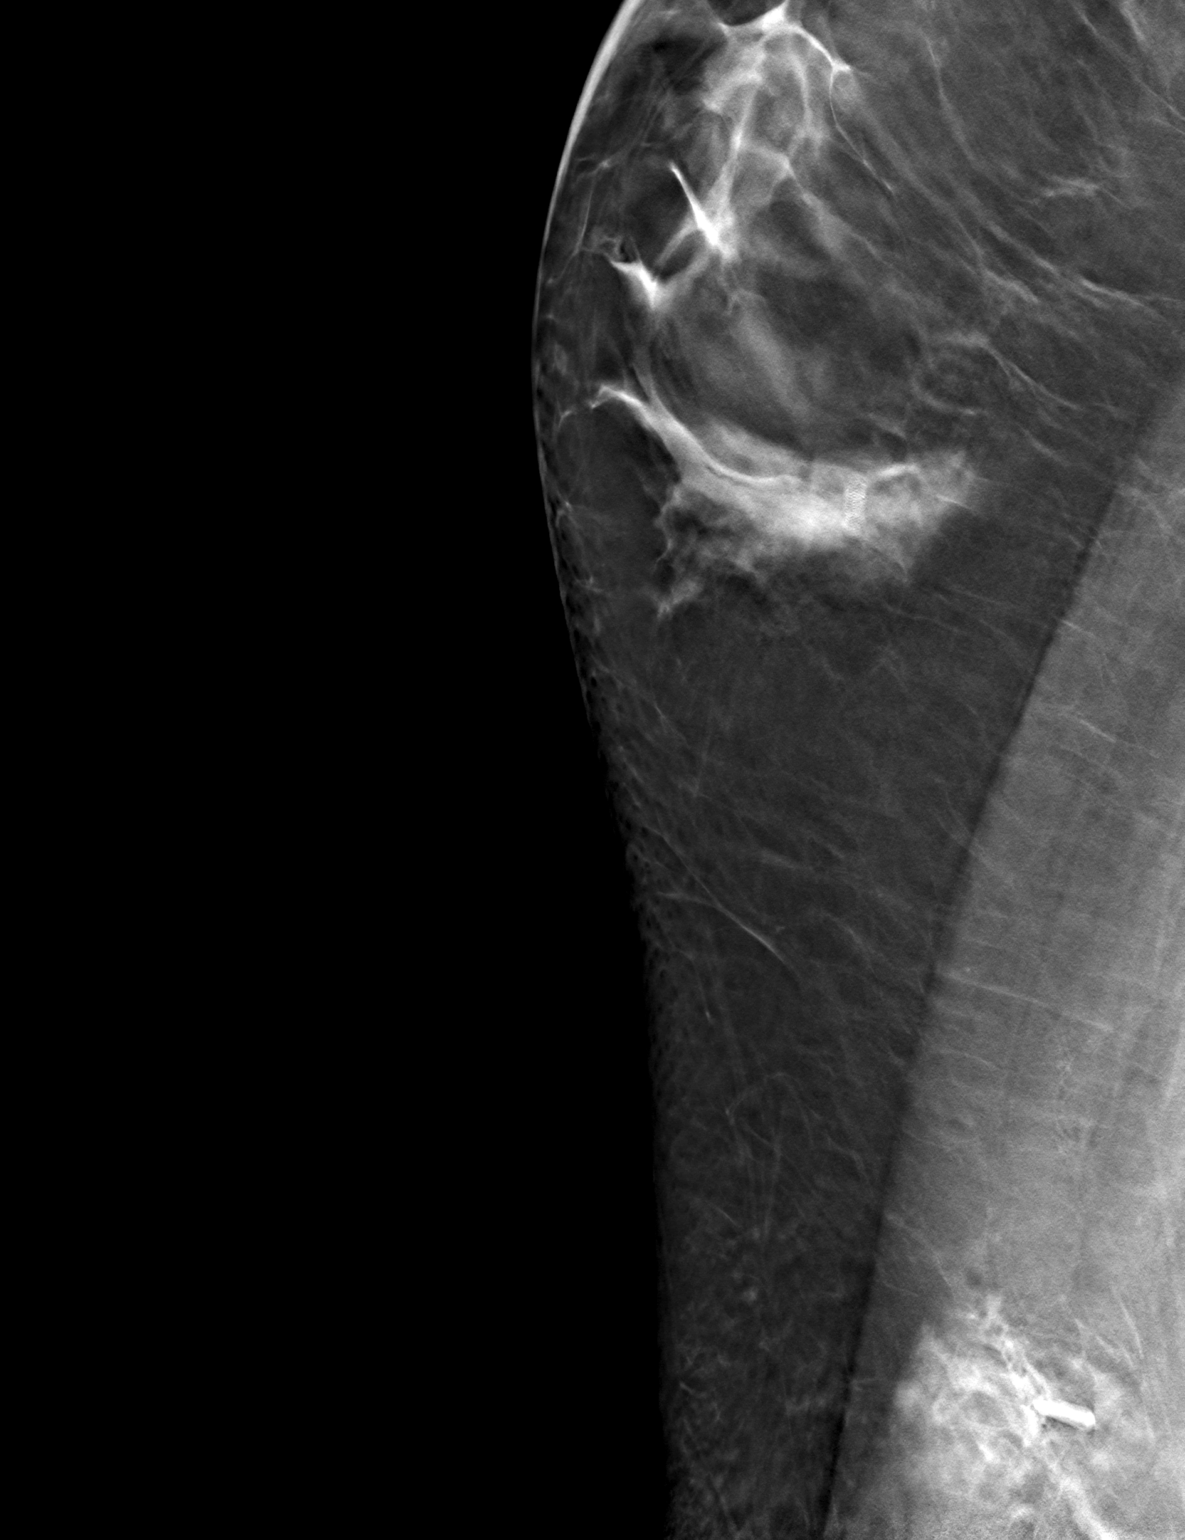
[frame 42/83]
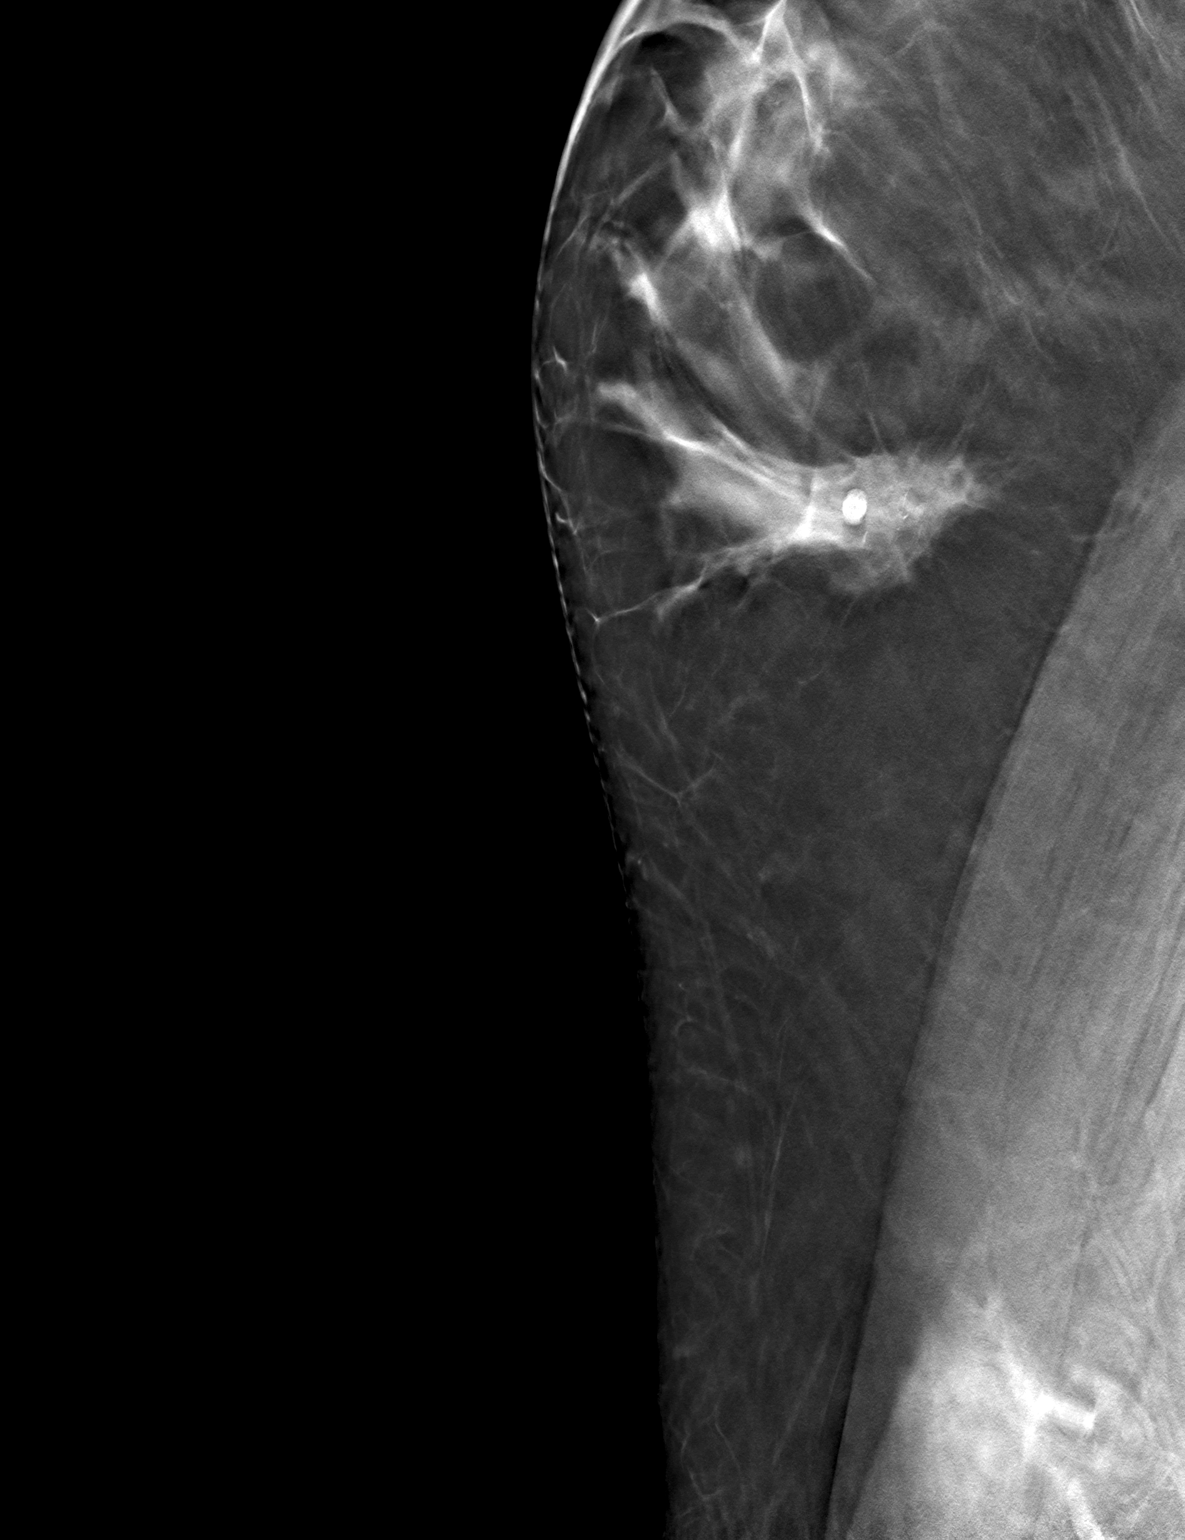

[3 of 6 positions shown; findings below may reference images not displayed]

FINDINGS: 3D Mammographic images were obtained following left axillary RF tag
placement. The radiofrequency tag is adjacent to the clipped lymph
node.
IMPRESSION: Appropriate positioning of the radiofrequency tag in the left
axilla.

## 2021-06-04 NOTE — Telephone Encounter (Signed)
Per [facility] representative- please send orders for this patient. SX 9/8. Thank you.

## 2021-06-06 ENCOUNTER — Other Ambulatory Visit: Payer: Self-pay

## 2021-06-06 ENCOUNTER — Inpatient Hospital Stay (HOSPITAL_BASED_OUTPATIENT_CLINIC_OR_DEPARTMENT_OTHER): Payer: 59 | Admitting: Hospice and Palliative Medicine

## 2021-06-06 ENCOUNTER — Inpatient Hospital Stay: Payer: 59

## 2021-06-06 ENCOUNTER — Inpatient Hospital Stay: Payer: 59 | Attending: Oncology

## 2021-06-06 VITALS — BP 103/65 | HR 86 | Temp 98.9°F | Resp 18

## 2021-06-06 DIAGNOSIS — Z9013 Acquired absence of bilateral breasts and nipples: Secondary | ICD-10-CM | POA: Diagnosis not present

## 2021-06-06 DIAGNOSIS — Z808 Family history of malignant neoplasm of other organs or systems: Secondary | ICD-10-CM | POA: Diagnosis not present

## 2021-06-06 DIAGNOSIS — F1210 Cannabis abuse, uncomplicated: Secondary | ICD-10-CM | POA: Diagnosis not present

## 2021-06-06 DIAGNOSIS — C50919 Malignant neoplasm of unspecified site of unspecified female breast: Secondary | ICD-10-CM

## 2021-06-06 DIAGNOSIS — R0602 Shortness of breath: Secondary | ICD-10-CM

## 2021-06-06 DIAGNOSIS — Z809 Family history of malignant neoplasm, unspecified: Secondary | ICD-10-CM | POA: Diagnosis not present

## 2021-06-06 DIAGNOSIS — C50812 Malignant neoplasm of overlapping sites of left female breast: Secondary | ICD-10-CM | POA: Diagnosis present

## 2021-06-06 DIAGNOSIS — C50412 Malignant neoplasm of upper-outer quadrant of left female breast: Secondary | ICD-10-CM

## 2021-06-06 DIAGNOSIS — E876 Hypokalemia: Secondary | ICD-10-CM | POA: Insufficient documentation

## 2021-06-06 DIAGNOSIS — Z171 Estrogen receptor negative status [ER-]: Secondary | ICD-10-CM

## 2021-06-06 DIAGNOSIS — R197 Diarrhea, unspecified: Secondary | ICD-10-CM | POA: Diagnosis not present

## 2021-06-06 DIAGNOSIS — R110 Nausea: Secondary | ICD-10-CM

## 2021-06-06 DIAGNOSIS — R7989 Other specified abnormal findings of blood chemistry: Secondary | ICD-10-CM | POA: Insufficient documentation

## 2021-06-06 DIAGNOSIS — Z95828 Presence of other vascular implants and grafts: Secondary | ICD-10-CM

## 2021-06-06 DIAGNOSIS — T451X5A Adverse effect of antineoplastic and immunosuppressive drugs, initial encounter: Secondary | ICD-10-CM

## 2021-06-06 DIAGNOSIS — R112 Nausea with vomiting, unspecified: Secondary | ICD-10-CM | POA: Insufficient documentation

## 2021-06-06 LAB — COMPREHENSIVE METABOLIC PANEL
ALT: 81 U/L — ABNORMAL HIGH (ref 0–44)
AST: 44 U/L — ABNORMAL HIGH (ref 15–41)
Albumin: 3.9 g/dL (ref 3.5–5.0)
Alkaline Phosphatase: 65 U/L (ref 38–126)
Anion gap: 11 (ref 5–15)
BUN: 9 mg/dL (ref 6–20)
CO2: 25 mmol/L (ref 22–32)
Calcium: 8.9 mg/dL (ref 8.9–10.3)
Chloride: 102 mmol/L (ref 98–111)
Creatinine, Ser: 0.7 mg/dL (ref 0.44–1.00)
GFR, Estimated: >60 mL/min (ref >60)
Glucose, Bld: 99 mg/dL (ref 70–99)
Potassium: 3.2 mmol/L — ABNORMAL LOW (ref 3.5–5.1)
Sodium: 138 mmol/L (ref 135–145)
Total Bilirubin: 0.3 mg/dL (ref 0.3–1.2)
Total Protein: 7 g/dL (ref 6.5–8.1)

## 2021-06-06 LAB — CBC WITH DIFFERENTIAL/PLATELET
Abs Immature Granulocytes: 0.58 10*3/uL — ABNORMAL HIGH (ref 0.00–0.07)
Basophils Absolute: 0.1 10*3/uL (ref 0.0–0.1)
Basophils Relative: 1 %
Eosinophils Absolute: 0 10*3/uL (ref 0.0–0.5)
Eosinophils Relative: 0 %
HCT: 29.3 % — ABNORMAL LOW (ref 36.0–46.0)
Hemoglobin: 10.3 g/dL — ABNORMAL LOW (ref 12.0–15.0)
Immature Granulocytes: 8 %
Lymphocytes Relative: 12 %
Lymphs Abs: 0.8 10*3/uL (ref 0.7–4.0)
MCH: 33 pg (ref 26.0–34.0)
MCHC: 35.2 g/dL (ref 30.0–36.0)
MCV: 93.9 fL (ref 80.0–100.0)
Monocytes Absolute: 1.1 10*3/uL — ABNORMAL HIGH (ref 0.1–1.0)
Monocytes Relative: 15 %
Neutro Abs: 4.4 10*3/uL (ref 1.7–7.7)
Neutrophils Relative %: 64 %
Platelets: 322 10*3/uL (ref 150–400)
RBC: 3.12 MIL/uL — ABNORMAL LOW (ref 3.87–5.11)
RDW: 12.8 % (ref 11.5–15.5)
Smear Review: NORMAL
WBC: 6.9 10*3/uL (ref 4.0–10.5)
nRBC: 0 % (ref 0.0–0.2)

## 2021-06-06 MED ORDER — POTASSIUM CHLORIDE CRYS ER 20 MEQ PO TBCR: 20.0000 meq | EXTENDED_RELEASE_TABLET | Freq: Every day | ORAL | 0 refills | Status: DC

## 2021-06-06 MED ORDER — PROCHLORPERAZINE MALEATE 10 MG PO TABS: 10.0000 mg | ORAL_TABLET | Freq: Four times a day (QID) | ORAL | 1 refills | Status: DC | PRN

## 2021-06-06 MED ORDER — POTASSIUM CHLORIDE 10 MEQ/100ML IV SOLN
10.0000 meq | Freq: Once | INTRAVENOUS | Status: DC
  Filled 2021-06-06 (×2): qty 100

## 2021-06-06 MED ORDER — SODIUM CHLORIDE 0.9 % IV SOLN: 4.0000 mg | Freq: Once | INTRAVENOUS | Status: DC

## 2021-06-06 MED ORDER — SODIUM CHLORIDE 0.9 % IV SOLN
INTRAVENOUS | Status: DC
  Filled 2021-06-06: qty 250

## 2021-06-06 MED ORDER — ONDANSETRON HCL 8 MG PO TABS: 8.0000 mg | ORAL_TABLET | Freq: Three times a day (TID) | ORAL | 1 refills | Status: DC | PRN

## 2021-06-06 MED ORDER — SODIUM CHLORIDE 0.9% FLUSH
10.0000 mL | Freq: Once | INTRAVENOUS | Status: DC
  Filled 2021-06-06: qty 10

## 2021-06-06 MED ORDER — DEXAMETHASONE SODIUM PHOSPHATE 10 MG/ML IJ SOLN
4.0000 mg | Freq: Once | INTRAMUSCULAR | Status: AC
  Administered 2021-06-06: 4 mg via INTRAVENOUS
  Filled 2021-06-06: qty 1

## 2021-06-06 MED ORDER — HEPARIN SOD (PORK) LOCK FLUSH 100 UNIT/ML IV SOLN
500.0000 [IU] | Freq: Once | INTRAVENOUS | Status: AC
  Administered 2021-06-06: 500 [IU] via INTRAVENOUS
  Filled 2021-06-06: qty 5

## 2021-06-06 MED ORDER — ONDANSETRON HCL 4 MG/2ML IJ SOLN
4.0000 mg | Freq: Once | INTRAMUSCULAR | Status: AC
  Administered 2021-06-06: 4 mg via INTRAVENOUS
  Filled 2021-06-06: qty 2

## 2021-06-06 MED ORDER — LIDOCAINE-PRILOCAINE 2.5-2.5 % EX CREA: 1.0000 "application " | TOPICAL_CREAM | Freq: Every day | CUTANEOUS | 3 refills | Status: DC | PRN

## 2021-06-06 MED ORDER — POTASSIUM CHLORIDE 10 MEQ/100ML IV SOLN
10.0000 meq | Freq: Once | INTRAVENOUS | Status: AC
  Administered 2021-06-06: 10 meq via INTRAVENOUS
  Filled 2021-06-06: qty 100

## 2021-06-06 NOTE — Progress Notes (Signed)
[facility] [place]  Telephone:([phone] [phone]  Patient Care Team: Juluis Pitch, MD as PCP - General (Family Medicine) Sindy Guadeloupe, MD as Consulting Physician (Hematology and Oncology)   Name of the patient: Vickie Mathis  [id number]  [birth year]   Date of visit: 06/06/21  Reason for Consult: Vickie Mathis is a 30 year old female with multiple medical problems including stage IIIb 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 breast cancer on systemic treatment with [facility] Keytruda chemotherapy.  Patient last saw Dr. Janese Banks on 05/20/2021 and was in her usual state of health.  She appeared to be tolerating treatment fairly well other than minor fatigue and neuropathy.  Patient received cycle 4 of neoadjuvant AC Keytruda , which was her last cycle.  She is scheduled for bilateral mastectomy with reconstruction on 06/12/2021.  Patient will then follow-up with Dr. Janese Banks in 5 weeks likely with plan for adjuvant Keytruda and adjuvant XRT.  Patient presents to the [facility] today with nausea, vomiting, diarrhea, which she says has been present since last cycle of chemotherapy.  She says symptoms are not every day but has been fairly persistent.  Nausea and vomiting are often first thing in the morning when she tries to eat.  She has been taking ondansetron but not regularly.  She says it helps when she takes it.  She was also previously prescribed olanzapine but never tried it.  She reports loose stools intermittently.  Last loose stool was yesterday but none today.  Patient denies fever or chills.  No abdominal pain.  Appetite has been reduced.  Patient also endorses occasional shortness of breath and bandlike chest pressure without identifiable triggers.  Patient does occasionally get short of breath and fatigued exertionally.  No cough.  Patient says that the symptoms have been occurring over the past several months without increase in frequency or severity.  She denies  chest pain or shortness of breath today.  Denies any neurologic complaints. Denies recent fevers or illnesses. Denies any easy bleeding or bruising. Denies urinary complaints. Patient offers no further specific complaints today.  PAST MEDICAL HISTORY: Past Medical History:  Diagnosis Date   Anxiety    Asthma    Breast cancer ([place]) 11/2020   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 breast ca   Depression    Family history of cancer    History of chemotherapy    Varicose veins of bilateral lower extremities with pain     PAST SURGICAL HISTORY:  Past Surgical History:  Procedure Laterality Date   APPENDECTOMY  2018   BREAST BIOPSY Left 11/26/2020   vision 12:00 6cmfn [facility]   BREAST BIOPSY Left 11/26/2020   LN bx, hydro marker,  fragments of macrometastatic carcinoma   PORTACATH PLACEMENT Right 12/13/2020   Procedure: INSERTION PORT-A-CATH;  Surgeon: Herbert Pun, MD;  Location: [facility] ORS;  Service: General;  Laterality: Right;    HEMATOLOGY/ONCOLOGY HISTORY:  Oncology History  Invasive carcinoma of breast ([place])  12/03/2020 Initial Diagnosis   Invasive carcinoma of breast ([place])   12/17/2020 -  Chemotherapy    Patient is on Treatment Plan: BREAST PEMBROLIZUMAB + CARBOPLATIN D1,8,15+ PACLITAXEL D1,8,15 Q21D X 4 CYCLES / PEMBROLIZUMAB + AC Q21D X 4 CYCLES       12/17/2020 Cancer Staging   Staging form: Breast, AJCC 8th Edition - Clinical stage from 12/17/2020: Stage IIIB (cT2, cN1, cM0, G3, ER-, PR-, HER2-) - Signed by Sindy Guadeloupe, MD on 12/19/2020 Histologic grading system: 3 grade system    Genetic Testing  Negative genetic testing. No pathogenic variants identified on the [employer] CancerNext-Expanded+RNA Panel. 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 The report date is 01/14/2021.  The CancerNext-Expanded + RNAinsight gene panel offered by [employer] and includes sequencing and rearrangement analysis for the following 77 genes: IP, ALK, APC*, ATM*, AXIN2, BAP1, BARD1, BLM, BMPR1A,  BRCA1*, BRCA2*, BRIP1*, CDC73, CDH1*,CDK4, CDKN1B, CDKN2A, CHEK2*, CTNNA1, DICER1, FANCC, FH, FLCN, GALNT12, KIF1B, LZTR1, MAX, MEN1, MET, MLH1*, MSH2*, MSH3, MSH6*, MUTYH*, NBN, NF1*, NF2, NTHL1, PALB2*, PHOX2B, PMS2*, POT1, PRKAR1A, PTCH1, PTEN*, RAD51C*, RAD51D*,RB1, RECQL, RET, SDHA, SDHAF2, SDHB, SDHC, SDHD, SMAD4, SMARCA4, SMARCB1, SMARCE1, STK11, SUFU, TMEM127, TP53*,TSC1, TSC2, VHL and XRCC2 (sequencing and deletion/duplication); EGFR, EGLN1, HOXB13, KIT, MITF, PDGFRA, POLD1 and POLE (sequencing only); EPCAM and GREM1 (deletion/duplication only).     ALLERGIES:  is allergic to carboplatin, amoxicillin, and sulfa antibiotics.  MEDICATIONS:  Current Outpatient Medications  Medication Sig Dispense Refill   busPIRone (BUSPAR) 15 MG tablet Take 15 mg by mouth 2 (two) times daily.     citalopram (CELEXA) 40 MG tablet Take 1 tablet (40 mg total) by mouth daily. (Patient taking differently: Take 40 mg by mouth at bedtime.) 90 tablet 2   clonazePAM (KLONOPIN) 0.5 MG tablet TAKE 1 TABLET BY MOUTH 3 TIMES DAILY AS NEEDED. 60 tablet 0   Cyanocobalamin (B-12 PO) Take 1 tablet by mouth daily.     cyclobenzaprine (FLEXERIL) 5 MG tablet Take 1 tablet (5 mg total) by mouth 3 (three) times daily as needed for muscle spasms. (Patient not taking: Reported on 06/02/2021) 30 tablet 0   dexamethasone (DECADRON) 4 MG tablet Take 2 tablets (8 mg total) by mouth daily. Start the day after chemotherapy for 2 days. (Patient not taking: No sig reported) 30 tablet 1   docusate sodium (COLACE) 100 MG capsule Take 100 mg by mouth daily as needed for mild constipation.     folic acid (FOLVITE) 1 MG tablet TAKE 2 TABLETS BY MOUTH EVERY DAY 180 tablet 0   ibuprofen (ADVIL) 200 MG tablet Take 400 mg by mouth every 6 (six) hours as needed for moderate pain.     lidocaine-prilocaine (EMLA) cream Apply to affected area once (Patient taking differently: Apply 1 application topically daily as needed (prior to port access).) 30 g 3    LORazepam (ATIVAN) 0.5 MG tablet TAKE 1 TABLET (0.5 MG TOTAL) BY MOUTH EVERY 6 (SIX) HOURS AS NEEDED (NAUSEA OR VOMITING). (Patient not taking: Reported on 06/02/2021) 30 tablet 0   Multiple Vitamin (MULTIVITAMIN WITH MINERALS) TABS tablet Take 1 tablet by mouth daily.     OLANZapine (ZYPREXA) 10 MG tablet TAKE 1 TABLET BY MOUTH EVERYDAY AT BEDTIME (Patient not taking: No sig reported) 90 tablet 1   ondansetron (ZOFRAN ODT) 4 MG disintegrating tablet Take 1 tablet (4 mg total) by mouth every 8 (eight) hours as needed for nausea or vomiting. (Patient not taking: No sig reported) 20 tablet 0   ondansetron (ZOFRAN) 8 MG tablet Take 1 tablet (8 mg total) by mouth 2 (two) times daily as needed for refractory nausea / vomiting. Start on day 3 after chemo. 30 tablet 1   prochlorperazine (COMPAZINE) 10 MG tablet Take 1 tablet (10 mg total) by mouth every 6 (six) hours as needed (Nausea or vomiting). 30 tablet 1   triamcinolone ointment (KENALOG) 0.5 % Apply 1 application topically 2 (two) times daily. (Patient taking differently: Apply 1 application topically daily as needed (rash/itching).) 30 g 0   TURMERIC CURCUMIN PO Take 2 capsules  by mouth daily.     No current facility-administered medications for this visit.   Facility-Administered Medications Ordered in Other Visits  Medication Dose Route Frequency Provider Last Rate Last Admin   0.9 %  sodium chloride infusion   Intravenous Continuous Derl Abalos, Kirt Boys, NP        VITAL SIGNS: BP 103/65   Pulse 86   Temp 98.9 F (37.2 C) (Tympanic)   Resp 18   LMP  (LMP Unknown) Comment: due to chemo  SpO2 97%  There were no vitals filed for this visit.  Estimated body mass index is 29.74 kg/m as calculated from the following:   Height as of 05/20/21: _0  (1.753 m).   Weight as of 05/20/21: 201 lb 6.4 oz (91.4 kg).  LABS: CBC:    Component Value Date/Time   WBC 7.7 05/20/2021 0826   HGB 11.8 (L) 05/20/2021 0826   HCT 33.8 (L) 05/20/2021 0826    PLT 320 05/20/2021 0826   MCV 99.7 05/20/2021 0826   NEUTROABS 4.2 05/20/2021 0826   LYMPHSABS 1.4 05/20/2021 0826   MONOABS 1.3 (H) 05/20/2021 0826   EOSABS 0.1 05/20/2021 0826   BASOSABS 0.2 (H) 05/20/2021 0826   Comprehensive Metabolic Panel:    Component Value Date/Time   NA 137 05/20/2021 0826   K 3.7 05/20/2021 0826   CL 101 05/20/2021 0826   CO2 27 05/20/2021 0826   BUN 9 05/20/2021 0826   CREATININE 0.72 05/20/2021 0826   GLUCOSE 101 (H) 05/20/2021 0826   CALCIUM 9.3 05/20/2021 0826   AST 36 05/20/2021 0826   ALT 47 (H) 05/20/2021 0826   ALKPHOS 82 05/20/2021 0826   BILITOT 0.5 05/20/2021 0826   PROT 7.3 05/20/2021 0826   ALBUMIN 4.3 05/20/2021 0826    RADIOGRAPHIC STUDIES: MM DIAG BREAST TOMO UNI LEFT  Result Date: 06/04/2021 CLINICAL DATA:  Post procedure mammogram to confirm RF tag placement. EXAM: 3D DIAGNOSTIC LEFT MAMMOGRAM POST RF TAG PLACEMENT COMPARISON:  Previous exam(s). FINDINGS: 3D Mammographic images were obtained following left axillary RF tag placement. The radiofrequency tag is adjacent to the clipped lymph node. IMPRESSION: Appropriate positioning of the radiofrequency tag in the left axilla. Electronically Signed   By: Audie Pinto M.D.   On: 06/04/2021 16:25  [place] LT RADIO FREQUENCY TAG LOC US GUIDE  Result Date: 06/04/2021 CLINICAL DATA:  30 year old female presenting for localization of a left axillary lymph node prior to surgery. EXAM: MAMMOGRAPHIC GUIDED RADIOFREQUENCY DEVICE LOCALIZATION OF THE LEFT AXILLA COMPARISON:  Previous exam(s) FINDINGS: Patient presents for radiofrequency device localization prior to left axillary dissection. I met with the patient and we discussed the procedure of radiofrequency device localization including benefits and alternatives. We discussed the high likelihood of a successful procedure. We discussed the risks of the procedure including infection, bleeding, tissue injury and further surgery. Informed, written  consent was given. The usual time-out protocol was performed immediately prior to the procedure. Using mammographic guidance, sterile technique, 1% lidocaine as local anesthesia, a radiofrequency tag was used to localize the clipped lymph node using a lateral approach. The follow-up mammogram images confirm that the RF device is in the expected location and are marked for Dr. Windell Moment. Follow-up survey of the patient confirms the presence of the RF device. The patient tolerated the procedure well and was [facility] from the [facility]. IMPRESSION: Radiofrequency device localization of the LEFT axilla. No apparent complications. Electronically Signed   By: Audie Pinto M.D.   On: 06/04/2021 16:23  PERFORMANCE STATUS (ECOG) : 1 - Symptomatic but completely ambulatory  Review of Systems Unless otherwise noted, a complete review of systems is negative.  Physical Exam General: NAD Cardiovascular: regular rate and rhythm Pulmonary: clear anterior/posterior fields Abdomen: soft, nontender, + bowel sounds GU: no suprapubic tenderness Extremities: no edema, no joint deformities Skin: no rashes Neurological: Nonfocal  Assessment and Plan- Patient is a 30 y.o. female Ms. Leilanny Fluitt is a 30 year old female with multiple medical problems including stage IIIb 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 breast cancer on systemic treatment with [facility] Keytruda chemotherapy pending bilateral mastectomies who presents to the clinic for evaluation of nausea, vomiting, and diarrhea.   Nausea, vomiting, and diarrhea -likely residual effects from chemotherapy.  Symptoms seemed to start at time of last cycle of chemotherapy.  Low suspicion for infectious etiology but will send for stool culture/C. difficile PCR to rule this out.  We will give IV fluids today along with IV Zofran/dexamethasone.  Discussed increasing her utilization of prescribed antiemetics.  We will increase frequency of ondansetron to 3 times daily.  Discussed  that she could also try olanzapine, which has been previously prescribed.  Also discussed OTC antidiarrheals.  Hypokalemia -we will give 10 M EQ KCL x1 and start on oral replacement over the next few days.   Shortness of breath-patient was not symptomatic in clinic today but says that she has been having intermittent symptoms over the past several months.  Discussed with Dr. Janese Banks and will send patient for an echocardiogram  Case and plan discussed with Dr. Janese Banks   Patient expressed understanding and was in agreement with this plan. She also understands that She can call clinic at any time with any questions, concerns, or complaints.   Thank you for allowing me to participate in the care of this very pleasant patient.   Time Total: 25 minutes  Visit consisted of counseling and education dealing with the complex and emotionally intense issues of symptom management and palliative care in the setting of serious and potentially life-threatening illness.Greater than 50%  of this time was spent counseling and coordinating care related to the above assessment and plan.  Signed by: Altha Harm, PhD, NP-C

## 2021-06-06 NOTE — Patient Instructions (Signed)

## 2021-06-06 NOTE — Progress Notes (Signed)
Pt states that she's had nausea, vomiting, and diarrhea for 3-4 days now. She has utilized her nausea meds, but has not felt much relief. Appetite has decreased. Also reports shortness of breath.

## 2021-06-10 ENCOUNTER — Other Ambulatory Visit: Payer: Self-pay

## 2021-06-10 ENCOUNTER — Other Ambulatory Visit
Admission: RE | Admit: 2021-06-10 | Discharge: 2021-06-10 | Disposition: A | Discharge status: Home / Self Care | Payer: 59 | Source: Ambulatory Visit | Attending: General Surgery | Admitting: General Surgery

## 2021-06-10 DIAGNOSIS — C50812 Malignant neoplasm of overlapping sites of left female breast: Secondary | ICD-10-CM | POA: Diagnosis not present

## 2021-06-10 DIAGNOSIS — Z01812 Encounter for preprocedural laboratory examination: Secondary | ICD-10-CM | POA: Diagnosis present

## 2021-06-10 DIAGNOSIS — Z20822 Contact with and (suspected) exposure to covid-19: Secondary | ICD-10-CM | POA: Diagnosis not present

## 2021-06-10 DIAGNOSIS — Z171 Estrogen receptor negative status [ER-]: Secondary | ICD-10-CM

## 2021-06-10 LAB — C DIFFICILE QUICK SCREEN W PCR REFLEX
C Diff antigen: POSITIVE — AB
C Diff toxin: NEGATIVE

## 2021-06-10 LAB — SARS CORONAVIRUS 2 (TAT 6-24 HRS): SARS Coronavirus 2: NEGATIVE

## 2021-06-11 ENCOUNTER — Other Ambulatory Visit: Payer: Self-pay | Admitting: Hospice and Palliative Medicine

## 2021-06-11 LAB — CLOSTRIDIUM DIFFICILE BY PCR, REFLEXED: Toxigenic C. Difficile by PCR: POSITIVE — AB

## 2021-06-11 MED ORDER — VANCOMYCIN HCL 125 MG PO CAPS: 125.0000 mg | ORAL_CAPSULE | Freq: Four times a day (QID) | ORAL | 0 refills | Status: DC

## 2021-06-11 NOTE — Progress Notes (Deleted)
Patient CBG was 66 prior to surgery.  Anesthesia aware.

## 2021-06-11 NOTE — Progress Notes (Signed)
Patient phoned 06/05/21 with symptoms of nausea/vomiting/diarrhea.  States she has appointment  and cannot come for symptom management clinic that day, so scheduled for 06/06/21.  Results of stool specimen reported today as positive for C-Diff.  Patients husband called for recommendation as patient scheduled for breast surgery 06/12/21.  Notified Dr. Janese Banks, and Dr. Windell Moment.  Plans to initiate Vancomycin, and delay surgery.

## 2021-06-11 NOTE — Progress Notes (Signed)
C. difficile PCR positive.  Spoke with patient by phone.  She reports nausea and vomiting have resolved but patient is still having occasional diarrhea.  Last diarrheal stool yesterday.  No fever or chills reported.  No other symptomatic changes.  Spoke with Dr. Janese Banks and will start patient on oral vancomycin x10 days.  Patient's surgeon is aware is also discussed with patient.

## 2021-06-12 ENCOUNTER — Ambulatory Visit
Admission: RE | Admit: 2021-06-12 | Discharge: 2021-06-12 | Disposition: A | Discharge status: Home / Self Care | Payer: 59 | Source: Ambulatory Visit | Attending: General Surgery | Admitting: General Surgery

## 2021-06-12 ENCOUNTER — Other Ambulatory Visit: Payer: Self-pay

## 2021-06-12 ENCOUNTER — Ambulatory Visit: Payer: 59

## 2021-06-12 DIAGNOSIS — C50912 Malignant neoplasm of unspecified site of left female breast: Secondary | ICD-10-CM

## 2021-06-12 DIAGNOSIS — C773 Secondary and unspecified malignant neoplasm of axilla and upper limb lymph nodes: Secondary | ICD-10-CM

## 2021-06-16 ENCOUNTER — Ambulatory Visit
Admission: RE | Admit: 2021-06-16 | Discharge: 2021-06-16 | Disposition: A | Discharge status: Home / Self Care | Payer: 59 | Source: Ambulatory Visit | Attending: Hospice and Palliative Medicine | Admitting: Hospice and Palliative Medicine

## 2021-06-16 ENCOUNTER — Other Ambulatory Visit: Payer: Self-pay

## 2021-06-16 DIAGNOSIS — C50919 Malignant neoplasm of unspecified site of unspecified female breast: Secondary | ICD-10-CM | POA: Insufficient documentation

## 2021-06-16 DIAGNOSIS — J45909 Unspecified asthma, uncomplicated: Secondary | ICD-10-CM | POA: Insufficient documentation

## 2021-06-16 DIAGNOSIS — R0602 Shortness of breath: Secondary | ICD-10-CM | POA: Insufficient documentation

## 2021-06-16 LAB — ECHOCARDIOGRAM COMPLETE
AR max vel: 2.03 cm2
AV Area VTI: 1.91 cm2
AV Area mean vel: 1.86 cm2
AV Mean grad: 4 mmHg
AV Peak grad: 7.5 mmHg
Ao pk vel: 1.37 m/s
Area-P 1/2: 3.74 cm2
MV VTI: 2.94 cm2
S' Lateral: 3.06 cm

## 2021-06-16 NOTE — Progress Notes (Signed)
*  PRELIMINARY RESULTS* Echocardiogram 2D Echocardiogram has been performed.  Vickie Mathis 06/16/2021, 11:43 AM

## 2021-06-20 ENCOUNTER — Encounter: Payer: 59 | Admitting: Plastic Surgery

## 2021-06-23 ENCOUNTER — Other Ambulatory Visit
Admission: RE | Admit: 2021-06-23 | Discharge: 2021-06-23 | Disposition: A | Discharge status: Home / Self Care | Payer: 59 | Source: Ambulatory Visit | Attending: General Surgery | Admitting: General Surgery

## 2021-06-23 ENCOUNTER — Other Ambulatory Visit: Payer: Self-pay

## 2021-06-23 ENCOUNTER — Other Ambulatory Visit: Payer: Self-pay | Admitting: Oncology

## 2021-06-23 DIAGNOSIS — Z01812 Encounter for preprocedural laboratory examination: Secondary | ICD-10-CM | POA: Diagnosis not present

## 2021-06-23 DIAGNOSIS — Z20822 Contact with and (suspected) exposure to covid-19: Secondary | ICD-10-CM | POA: Insufficient documentation

## 2021-06-23 LAB — SARS CORONAVIRUS 2 (TAT 6-24 HRS): SARS Coronavirus 2: NEGATIVE

## 2021-06-24 ENCOUNTER — Other Ambulatory Visit: Payer: Self-pay | Admitting: *Deleted

## 2021-06-24 ENCOUNTER — Encounter: Payer: Self-pay | Admitting: Oncology

## 2021-06-24 ENCOUNTER — Inpatient Hospital Stay (HOSPITAL_BASED_OUTPATIENT_CLINIC_OR_DEPARTMENT_OTHER): Payer: 59 | Admitting: Oncology

## 2021-06-24 ENCOUNTER — Inpatient Hospital Stay: Payer: 59

## 2021-06-24 VITALS — BP 109/60 | HR 84 | Temp 98.4°F | Resp 16 | Wt 198.8 lb

## 2021-06-24 DIAGNOSIS — Z171 Estrogen receptor negative status [ER-]: Secondary | ICD-10-CM

## 2021-06-24 DIAGNOSIS — C50412 Malignant neoplasm of upper-outer quadrant of left female breast: Secondary | ICD-10-CM

## 2021-06-24 DIAGNOSIS — R945 Abnormal results of liver function studies: Secondary | ICD-10-CM | POA: Diagnosis not present

## 2021-06-24 DIAGNOSIS — C50919 Malignant neoplasm of unspecified site of unspecified female breast: Secondary | ICD-10-CM

## 2021-06-24 DIAGNOSIS — R7989 Other specified abnormal findings of blood chemistry: Secondary | ICD-10-CM

## 2021-06-24 DIAGNOSIS — C50812 Malignant neoplasm of overlapping sites of left female breast: Secondary | ICD-10-CM | POA: Diagnosis not present

## 2021-06-24 LAB — COMPREHENSIVE METABOLIC PANEL
ALT: 86 U/L — ABNORMAL HIGH (ref 0–44)
AST: 57 U/L — ABNORMAL HIGH (ref 15–41)
Albumin: 3.8 g/dL (ref 3.5–5.0)
Alkaline Phosphatase: 82 U/L (ref 38–126)
Anion gap: 8 (ref 5–15)
BUN: 11 mg/dL (ref 6–20)
CO2: 25 mmol/L (ref 22–32)
Calcium: 9.2 mg/dL (ref 8.9–10.3)
Chloride: 107 mmol/L (ref 98–111)
Creatinine, Ser: 0.67 mg/dL (ref 0.44–1.00)
GFR, Estimated: >60 mL/min (ref >60)
Glucose, Bld: 117 mg/dL — ABNORMAL HIGH (ref 70–99)
Potassium: 3.7 mmol/L (ref 3.5–5.1)
Sodium: 140 mmol/L (ref 135–145)
Total Bilirubin: 0.4 mg/dL (ref 0.3–1.2)
Total Protein: 6.6 g/dL (ref 6.5–8.1)

## 2021-06-24 LAB — CBC WITH DIFFERENTIAL/PLATELET
Abs Immature Granulocytes: 0.02 10*3/uL (ref 0.00–0.07)
Basophils Absolute: 0.1 10*3/uL (ref 0.0–0.1)
Basophils Relative: 1 %
Eosinophils Absolute: 3.3 10*3/uL — ABNORMAL HIGH (ref 0.0–0.5)
Eosinophils Relative: 37 %
HCT: 33.8 % — ABNORMAL LOW (ref 36.0–46.0)
Hemoglobin: 11.6 g/dL — ABNORMAL LOW (ref 12.0–15.0)
Immature Granulocytes: 0 %
Lymphocytes Relative: 13 %
Lymphs Abs: 1.1 10*3/uL (ref 0.7–4.0)
MCH: 33.7 pg (ref 26.0–34.0)
MCHC: 34.3 g/dL (ref 30.0–36.0)
MCV: 98.3 fL (ref 80.0–100.0)
Monocytes Absolute: 0.6 10*3/uL (ref 0.1–1.0)
Monocytes Relative: 7 %
Neutro Abs: 3.6 10*3/uL (ref 1.7–7.7)
Neutrophils Relative %: 42 %
Platelets: 191 10*3/uL (ref 150–400)
RBC: 3.44 MIL/uL — ABNORMAL LOW (ref 3.87–5.11)
RDW: 14.1 % (ref 11.5–15.5)
WBC: 8.8 10*3/uL (ref 4.0–10.5)
nRBC: 0 % (ref 0.0–0.2)

## 2021-06-24 NOTE — Progress Notes (Signed)
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 difficile infection  Heme/Onc history: patient is a 30 year old female who self palpated a left breast mass about 10 months ago and since then it has been slowly growing.  She sought medical attention recently after thinking it was a possible cyst for all this while.She underwent a diagnostic bilateral mammogram and ultrasound which showed a 2.7 cm mass at the 12 o'clock position 6 cm from the nipple.  Ultrasound of the left axilla demonstrates 2 lymph nodes with mild thickened cortices of 4 mm.  There is skin thickening on the lower inner quadrant of the left breast on mammography.  Both the breast mass and the lymph node were biopsied and was positive for invasive mammary carcinoma grade 3.  Lymph node was also suspicious for extracapsular extension.    ER/PR and HER-2 negative   MRI showed irregular enhancing mass at the 12 o'clock position of the left breast measuring 2.5 x 2.3 cm and a linear component extending 2.3 cm anteriorly.  The mass in the anterior linear extension combined measure 4.3 cm.  3.9 cm in the cephalocaudal dimension.  Also an area of clumped non-mass enhancement in the posterior aspect of the lower quadrant of the left breast measuring 2.6 x 0.9 cm which looks suspicious.  2 enlarged left axillary lymph nodes and 2 mildly enlarged internal mammary lymph nodes.  4.3 cm clumped area of non-mass enhancement in the outer quadrant of the right breast.  3 right axillary  lymph nodes with mild cortical thickening.   Patient underwent biopsy of the right breast non-mass enhancement and that was negative for malignancy   CT scan showed mildly enlarged right inguinal lymph nodes nonspecific.  Left upper breast mass with prominent left axillary lymph nodes.  Subcentimeter pulmonary nodules which are calcified and compatible with benign old granulomatous disease.  0.6 5.4 cm lucent lesion in the left first rib possibly a hemangioma or benign lesion.   Patient developed significant infusion reaction to carboplatin with dose 9 when her blood pressure dropped and she became tachycardic and significantly nauseous.  Interval history-she is overall improved from a seizure standpoint and bowel movements are regular.  She will be undergoing bilateral mastectomy with reconstruction tomorrow and reports feelings of anxiety prior to surgery.  Baseline fatigue.  Denies other complaints  ECOG PS- 1 Pain scale- 0   Review of systems- Review of Systems  Constitutional:  Positive for malaise/fatigue. Negative for chills, fever and weight loss.  HENT:  Negative for congestion, ear discharge and nosebleeds.   Eyes:  Negative for blurred vision.  Respiratory:  Negative for cough, hemoptysis, sputum production, shortness of breath and wheezing.   Cardiovascular:  Negative for chest pain, palpitations, orthopnea and claudication.  Gastrointestinal:  Negative for abdominal pain, blood in stool, constipation, diarrhea, heartburn, melena, nausea and vomiting.  Genitourinary:  Negative for dysuria, flank pain, frequency, hematuria and urgency.  Musculoskeletal:  Negative for back pain, joint pain and myalgias.  Skin:  Negative for rash.  Neurological:  Negative for dizziness,  tingling, focal weakness, seizures, weakness and headaches.  Endo/Heme/Allergies:  Does not bruise/bleed easily.  Psychiatric/Behavioral:  Negative for depression and suicidal ideas. The patient is nervous/anxious.  The patient does not have insomnia.      Allergies  Allergen Reactions   Carboplatin Shortness Of Breath, Nausea And Vomiting and Other (See Comments)    Flushing- chest , face, neck , arm including hand   Amoxicillin     Other reaction(s): "too young to remember what they do to me"   Sulfa Antibiotics     Other reaction(s): "too young to remember what they do to me"     Past Medical History:  Diagnosis Date   Anxiety    Asthma    Breast cancer ([place]) 11/2020   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 breast ca   Depression    Family history of cancer    History of chemotherapy    Varicose veins of bilateral lower extremities with pain      Past Surgical History:  Procedure Laterality Date   APPENDECTOMY  2018   BREAST BIOPSY Left 11/26/2020   vision 12:00 6cmfn [facility]   BREAST BIOPSY Left 11/26/2020   LN bx, hydro marker,  fragments of macrometastatic carcinoma   PORTACATH PLACEMENT Right 12/13/2020   Procedure: INSERTION PORT-A-CATH;  Surgeon: Herbert Pun, MD;  Location: [facility] ORS;  Service: General;  Laterality: Right;    Social History   Socioeconomic History   Marital status: Married    Spouse name: Not on file   Number of children: Not on file   Years of education: Not on file   Highest education level: Not on file  Occupational History   Not on file  Tobacco Use   Smoking status: Never   Smokeless tobacco: Never  Vaping Use   Vaping Use: Never used  Substance and Sexual Activity   Alcohol use: Not Currently    Comment: occassinally    Drug use: Yes    Types: Marijuana    Comment: occ   Sexual activity: Yes  Other Topics Concern   Not on file  Social History Narrative   Not on file   Social Determinants of Health   Financial Resource Strain: Not on file  Food Insecurity: Not on file  Transportation Needs: Not on file  Physical Activity: Not on file  Stress: Not on file  Social Connections: Not on file  Intimate Partner Violence: Not on file    Family  History  Problem Relation Age of Onset   Diabetes Father    Varicose Veins Father    Diabetes Paternal Aunt    Uterine cancer Paternal Aunt        precancerous   Diabetes Paternal Grandmother    Uterine cancer Paternal Grandmother        precancerous   Thyroid disease Mother    Thyroid disease Maternal Aunt    Thyroid disease Maternal Grandmother    Cancer Other        stomach vs ovarian/cervical   Cancer Paternal Great-grandmother        unk     Current Outpatient Medications:    busPIRone (BUSPAR) 15 MG tablet, Take 15 mg by mouth 2 (two) times daily., Disp: , Rfl:    citalopram (CELEXA) 40 MG tablet, Take 1 tablet (40 mg total) by mouth daily. (Patient taking differently: Take 40 mg by mouth at bedtime.), Disp: 90 tablet, Rfl: 2   clonazePAM (KLONOPIN) 0.5 MG tablet, TAKE 1 TABLET BY MOUTH THREE TIMES A  DAY AS NEEDED, Disp: 60 tablet, Rfl: 0   ondansetron (ZOFRAN) 8 MG tablet, Take 1 tablet (8 mg total) by mouth every 8 (eight) hours as needed for refractory nausea / vomiting. Start on day 3 after chemo., Disp: 60 tablet, Rfl: 1   prochlorperazine (COMPAZINE) 10 MG tablet, Take 1 tablet (10 mg total) by mouth every 6 (six) hours as needed (Nausea or vomiting)., Disp: 30 tablet, Rfl: 1   Cyanocobalamin (B-12 PO), Take 1 tablet by mouth daily. (Patient not taking: Reported on 06/24/2021), Disp: , Rfl:    cyclobenzaprine (FLEXERIL) 5 MG tablet, Take 1 tablet (5 mg total) by mouth 3 (three) times daily as needed for muscle spasms. (Patient not taking: No sig reported), Disp: 30 tablet, Rfl: 0   docusate sodium (COLACE) 100 MG capsule, Take 100 mg by mouth daily as needed for mild constipation. (Patient not taking: Reported on 06/24/2021), Disp: , Rfl:    folic acid (FOLVITE) 1 MG tablet, TAKE 2 TABLETS BY MOUTH EVERY DAY (Patient not taking: Reported on 06/24/2021), Disp: 180 tablet, Rfl: 0   ibuprofen (ADVIL) 200 MG tablet, Take 400 mg by mouth every 6 (six) hours as needed for moderate  pain. (Patient not taking: Reported on 06/24/2021), Disp: , Rfl:    lidocaine-prilocaine (EMLA) cream, Apply 1 application topically daily as needed (prior to port access). (Patient not taking: Reported on 06/24/2021), Disp: 30 g, Rfl: 3   LORazepam (ATIVAN) 0.5 MG tablet, TAKE 1 TABLET (0.5 MG TOTAL) BY MOUTH EVERY 6 (SIX) HOURS AS NEEDED (NAUSEA OR VOMITING). (Patient not taking: No sig reported), Disp: 30 tablet, Rfl: 0   Multiple Vitamin (MULTIVITAMIN WITH MINERALS) TABS tablet, Take 1 tablet by mouth daily. (Patient not taking: Reported on 06/24/2021), Disp: , Rfl:    OLANZapine (ZYPREXA) 10 MG tablet, TAKE 1 TABLET BY MOUTH EVERYDAY AT BEDTIME (Patient not taking: No sig reported), Disp: 90 tablet, Rfl: 1   ondansetron (ZOFRAN ODT) 4 MG disintegrating tablet, Take 1 tablet (4 mg total) by mouth every 8 (eight) hours as needed for nausea or vomiting. (Patient not taking: No sig reported), Disp: 20 tablet, Rfl: 0   potassium chloride SA (KLOR-CON) 20 MEQ tablet, Take 1 tablet (20 mEq total) by mouth daily. (Patient not taking: Reported on 06/24/2021), Disp: 3 tablet, Rfl: 0   triamcinolone ointment (KENALOG) 0.5 %, Apply 1 application topically 2 (two) times daily. (Patient not taking: Reported on 06/24/2021), Disp: 30 g, Rfl: 0   TURMERIC CURCUMIN PO, Take 2 capsules by mouth daily. (Patient not taking: Reported on 06/24/2021), Disp: , Rfl:   Physical exam:  Vitals:   06/24/21 1012  BP: 109/60  Pulse: 84  Resp: 16  Temp: 98.4 F (36.9 C)  TempSrc: Tympanic  SpO2: 100%  Weight: 198 lb 12.8 oz (90.2 kg)   Physical Exam Constitutional:      General: She is not in acute distress. Cardiovascular:     Rate and Rhythm: Normal rate and regular rhythm.     Heart sounds: Normal heart sounds.  Pulmonary:     Effort: Pulmonary effort is normal.  Skin:    General: Skin is warm and dry.  Neurological:     Mental Status: She is alert and oriented to person, place, and time.     CMP Latest Ref Rng  & Units 06/24/2021  Glucose 70 - 99 mg/dL 117(H)  BUN 6 - 20 mg/dL 11  Creatinine 0.44 - 1.00 mg/dL 0.67  Sodium 135 - 145 mmol/L  140  Potassium 3.5 - 5.1 mmol/L 3.7  Chloride 98 - 111 mmol/L 107  CO2 22 - 32 mmol/L 25  Calcium 8.9 - 10.3 mg/dL 9.2  Total Protein 6.5 - 8.1 g/dL 6.6  Total Bilirubin 0.3 - 1.2 mg/dL 0.4  Alkaline Phos 38 - 126 U/L 82  AST 15 - 41 U/L 57(H)  ALT 0 - 44 U/L 86(H)   CBC Latest Ref Rng & Units 06/24/2021  WBC 4.0 - 10.5 K/uL 8.8  Hemoglobin 12.0 - 15.0 g/dL 11.6(L)  Hematocrit 36.0 - 46.0 % 33.8(L)  Platelets 150 - 400 K/uL 191      MM DIAG BREAST TOMO UNI LEFT  Result Date: 06/04/2021 CLINICAL DATA:  Post procedure mammogram to confirm RF tag placement. EXAM: 3D DIAGNOSTIC LEFT MAMMOGRAM POST RF TAG PLACEMENT COMPARISON:  Previous exam(s). FINDINGS: 3D Mammographic images were obtained following left axillary RF tag placement. The radiofrequency tag is adjacent to the clipped lymph node. IMPRESSION: Appropriate positioning of the radiofrequency tag in the left axilla. Electronically Signed   By: Audie Pinto M.D.   On: 06/04/2021 16:25  ECHOCARDIOGRAM COMPLETE  Result Date: 06/16/2021    ECHOCARDIOGRAM REPORT   Patient Name:   LINLEY MOSKAL Date of Exam: 06/16/2021 Medical Rec #:  [id number]      Height:       69.0 in Accession #:    [id number]     Weight:       201.4 lb Date of Birth:  Jun 20, 1991      BSA:          2.072 m Patient Age:    30 years       BP:           103/65 mmHg Patient Gender: F              HR:           80 bpm. Exam Location:  [facility] Procedure: 2D Echo, Color Doppler, Cardiac Doppler and Strain Analysis Indications:     R06.00 Dyspnea; Z09 Chemo  History:         Patient has prior history of Echocardiogram examinations, most                  recent 03/14/2021. Signs/Symptoms:Shortness of Breath. Asthma;                  Breast cancer.  Sonographer:     Charmayne Sheer Referring Phys:  [id number] [place] Diagnosing Phys: Kathlyn Sacramento MD   Sonographer Comments: No subcostal window. Global longitudinal strain was attempted. IMPRESSIONS  1. Left ventricular ejection fraction, by estimation, is 60 to 65%. The left ventricle has normal function. The left ventricle has no regional wall motion abnormalities. Left ventricular diastolic parameters were normal. The average left ventricular global longitudinal strain is -17.1 %. The global longitudinal strain is normal.  2. Right ventricular systolic function is normal. The right ventricular size is normal. Tricuspid regurgitation signal is inadequate for assessing PA pressure.  3. The mitral valve is normal in structure. No evidence of mitral valve regurgitation. No evidence of mitral stenosis.  4. The aortic valve is normal in structure. Aortic valve regurgitation is not visualized. No aortic stenosis is present.  5. The inferior vena cava is normal in size with greater than 50% respiratory variability, suggesting right atrial pressure of 3 mmHg. FINDINGS  Left Ventricle: Left ventricular ejection fraction, by estimation, is 60 to 65%. The left ventricle has normal function.  The left ventricle has no regional wall motion abnormalities. The average left ventricular global longitudinal strain is -17.1 %. The global longitudinal strain is normal. The left ventricular internal cavity size was normal in size. There is no left ventricular hypertrophy. Left ventricular diastolic parameters were normal. Right Ventricle: The right ventricular size is normal. No increase in right ventricular wall thickness. Right ventricular systolic function is normal. Tricuspid regurgitation signal is inadequate for assessing PA pressure. Left Atrium: Left atrial size was normal in size. Right Atrium: Right atrial size was normal in size. Pericardium: There is no evidence of pericardial effusion. Mitral Valve: The mitral valve is normal in structure. No evidence of mitral valve regurgitation. No evidence of mitral valve stenosis. MV  peak gradient, 2.4 mmHg. The mean mitral valve gradient is 1.0 mmHg. Tricuspid Valve: The tricuspid valve is normal in structure. Tricuspid valve regurgitation is trivial. No evidence of tricuspid stenosis. Aortic Valve: The aortic valve is normal in structure. Aortic valve regurgitation is not visualized. No aortic stenosis is present. Aortic valve mean gradient measures 4.0 mmHg. Aortic valve peak gradient measures 7.5 mmHg. Aortic valve area, by VTI measures 1.91 cm. Pulmonic Valve: The pulmonic valve was normal in structure. Pulmonic valve regurgitation is not visualized. No evidence of pulmonic stenosis. Aorta: The aortic root is normal in size and structure. Venous: The inferior vena cava is normal in size with greater than 50% respiratory variability, suggesting right atrial pressure of 3 mmHg. IAS/Shunts: No atrial level shunt detected by color flow Doppler.  LEFT VENTRICLE PLAX 2D LVIDd:         4.58 cm  Diastology LVIDs:         3.06 cm  LV e' medial:    10.10 cm/s LV PW:         0.78 cm  LV E/e' medial:  7.0 LV IVS:        0.70 cm  LV e' lateral:   14.60 cm/s LVOT diam:     1.90 cm  LV E/e' lateral: 4.8 LV SV:         49 LV SV Index:   24       2D Longitudinal Strain LVOT Area:     2.84 cm 2D Strain GLS Avg:     -17.1 %  RIGHT VENTRICLE RV Basal diam:  2.24 cm LEFT ATRIUM             Index       RIGHT ATRIUM           Index LA diam:        2.70 cm 1.30 cm/m  RA Area:     11.30 cm LA Vol (A2C):   29.8 ml 14.38 ml/m RA Volume:   23.70 ml  11.44 ml/m LA Vol (A4C):   31.6 ml 15.25 ml/m LA Biplane Vol: 30.8 ml 14.86 ml/m  AORTIC VALVE                   PULMONIC VALVE AV Area (Vmax):    2.03 cm    PV Vmax:       1.01 m/s AV Area (Vmean):   1.86 cm    PV Vmean:      63.200 cm/s AV Area (VTI):     1.91 cm    PV VTI:        0.152 m AV Vmax:           137.00 cm/s PV Peak grad:  4.1 mmHg AV Vmean:  99.600 cm/s PV Mean grad:  2.0 mmHg AV VTI:            0.258 m AV Peak Grad:      7.5 mmHg AV Mean  Grad:      4.0 mmHg LVOT Vmax:         97.90 cm/s LVOT Vmean:        65.300 cm/s LVOT VTI:          0.174 m LVOT/AV VTI ratio: 0.67  AORTA Ao Root diam: 2.50 cm MITRAL VALVE MV Area (PHT): 3.74 cm    SHUNTS MV Area VTI:   2.94 cm    Systemic VTI:  0.17 m MV Peak grad:  2.4 mmHg    Systemic Diam: 1.90 cm MV Mean grad:  1.0 mmHg MV Vmax:       0.78 m/s MV Vmean:      50.6 cm/s MV Decel Time: 203 msec MV E velocity: 70.70 cm/s MV A velocity: 55.30 cm/s MV E/A ratio:  1.28 Kathlyn Sacramento MD Electronically signed by Kathlyn Sacramento MD Signature Date/Time: 06/16/2021/2:52:45 PM    Final    [place] LT RADIO FREQUENCY TAG LOC US GUIDE  Result Date: 06/04/2021 CLINICAL DATA:  30 year old female presenting for localization of a left axillary lymph node prior to surgery. EXAM: MAMMOGRAPHIC GUIDED RADIOFREQUENCY DEVICE LOCALIZATION OF THE LEFT AXILLA COMPARISON:  Previous exam(s) FINDINGS: Patient presents for radiofrequency device localization prior to left axillary dissection. I met with the patient and we discussed the procedure of radiofrequency device localization including benefits and alternatives. We discussed the high likelihood of a successful procedure. We discussed the risks of the procedure including infection, bleeding, tissue injury and further surgery. Informed, written consent was given. The usual time-out protocol was performed immediately prior to the procedure. Using mammographic guidance, sterile technique, 1% lidocaine as local anesthesia, a radiofrequency tag was used to localize the clipped lymph node using a lateral approach. The follow-up mammogram images confirm that the RF device is in the expected location and are marked for Dr. Windell Moment. Follow-up survey of the patient confirms the presence of the RF device. The patient tolerated the procedure well and was [facility]. IMPRESSION: Radiofrequency device localization of the LEFT axilla. No apparent complications. Electronically  Signed   By: Audie Pinto M.D.   On: 06/04/2021 16:23    Assessment and plan- Patient is a 30 y.o. female  with stage IIIb triple negative breast cancer of the left breast cT2 N1 M0.  She is s/p CarboTaxol chemotherapy with Keytruda neoadjuvant followed by 4 cycles of AC Keytruda chemotherapy neoadjuvant.  This is a routine follow-up visit  From a seizure standpoint patient is much improved and she will be going for bilateral mastectomy with reconstruction tomorrow.  She does have mildly abnormal LFTs which are overall stable as compared to prior and we will continue to monitor with repeat labs in 3 weeks when I see her to discuss final pathology results.  From a breast cancer standpoint she will need adjuvant Keytruda for 9 cycles following surgery.  Depending on whether she has a pathological complete response or not I will consider adding Xeloda  I will also consider repeating herCT scans 1 to 2 months for now given that she had nonspecific right inguinal lymph nodes which were at the upper limit of normal and tiny pulmonary nodules which were more compatible with granulomatous disease.   Visit Diagnosis 1. Invasive carcinoma of breast ([place])   2.  Abnormal LFTs      Dr. Randa Evens, MD, MPH [facility] [phone] 06/24/2021 4:21 PM

## 2021-06-24 NOTE — Progress Notes (Signed)
Pt in for follow up, states has questions regarding surgery tomorrow.  States had c diff and has finished vancomycin.

## 2021-06-25 ENCOUNTER — Ambulatory Visit
Admission: RE | Admit: 2021-06-25 | Discharge: 2021-06-25 | Disposition: A | Discharge status: Home / Self Care | Payer: 59 | Source: Ambulatory Visit | Attending: General Surgery | Admitting: General Surgery

## 2021-06-25 ENCOUNTER — Other Ambulatory Visit: Payer: Self-pay

## 2021-06-25 ENCOUNTER — Ambulatory Visit: Payer: 59 | Admitting: Certified Registered"

## 2021-06-25 ENCOUNTER — Ambulatory Visit: Payer: 59 | Admitting: Urgent Care

## 2021-06-25 ENCOUNTER — Observation Stay
Admission: RE | Admit: 2021-06-25 | Discharge: 2021-06-26 | Disposition: A | Discharge status: Home / Self Care | Payer: 59 | Attending: General Surgery | Admitting: General Surgery

## 2021-06-25 ENCOUNTER — Encounter: Payer: Self-pay | Admitting: General Surgery

## 2021-06-25 ENCOUNTER — Encounter
Admission: RE | Disposition: A | Discharge status: Home / Self Care | Payer: Self-pay | Source: Home / Self Care | Attending: General Surgery

## 2021-06-25 DIAGNOSIS — Z79899 Other long term (current) drug therapy: Secondary | ICD-10-CM | POA: Diagnosis not present

## 2021-06-25 DIAGNOSIS — C50912 Malignant neoplasm of unspecified site of left female breast: Secondary | ICD-10-CM | POA: Diagnosis not present

## 2021-06-25 DIAGNOSIS — C50919 Malignant neoplasm of unspecified site of unspecified female breast: Secondary | ICD-10-CM | POA: Diagnosis not present

## 2021-06-25 DIAGNOSIS — C773 Secondary and unspecified malignant neoplasm of axilla and upper limb lymph nodes: Secondary | ICD-10-CM | POA: Diagnosis not present

## 2021-06-25 DIAGNOSIS — R079 Chest pain, unspecified: Secondary | ICD-10-CM

## 2021-06-25 DIAGNOSIS — J45909 Unspecified asthma, uncomplicated: Secondary | ICD-10-CM | POA: Diagnosis not present

## 2021-06-25 DIAGNOSIS — N6320 Unspecified lump in the left breast, unspecified quadrant: Secondary | ICD-10-CM | POA: Diagnosis not present

## 2021-06-25 HISTORY — PX: BREAST RECONSTRUCTION WITH PLACEMENT OF TISSUE EXPANDER AND FLEX HD (ACELLULAR HYDRATED DERMIS): SHX6295

## 2021-06-25 HISTORY — PX: BILATERAL TOTAL MASTECTOMY WITH AXILLARY LYMPH NODE DISSECTION: SHX6364

## 2021-06-25 LAB — URINE DRUG SCREEN, QUALITATIVE (ARMC ONLY)
Amphetamines, Ur Screen: NOT DETECTED
Barbiturates, Ur Screen: NOT DETECTED
Benzodiazepine, Ur Scrn: NOT DETECTED
Cannabinoid 50 Ng, Ur ~~LOC~~: POSITIVE — AB
Cocaine Metabolite,Ur ~~LOC~~: NOT DETECTED
MDMA (Ecstasy)Ur Screen: NOT DETECTED
Methadone Scn, Ur: NOT DETECTED
Opiate, Ur Screen: NOT DETECTED
Phencyclidine (PCP) Ur S: NOT DETECTED
Tricyclic, Ur Screen: NOT DETECTED

## 2021-06-25 LAB — POCT PREGNANCY, URINE: Preg Test, Ur: NEGATIVE

## 2021-06-25 SURGERY — BILATERAL TOTAL MASTECTOMY WITH AXILLARY LYMPH NODE DISSECTION
Anesthesia: General | Laterality: Bilateral

## 2021-06-25 MED ORDER — PHENYLEPHRINE HCL (PRESSORS) 10 MG/ML IV SOLN
INTRAVENOUS | Status: DC | PRN
  Administered 2021-06-25 (×2): 100 ug via INTRAVENOUS

## 2021-06-25 MED ORDER — GLYCOPYRROLATE 0.2 MG/ML IJ SOLN
INTRAMUSCULAR | Status: DC | PRN
  Administered 2021-06-25: .2 mg via INTRAVENOUS

## 2021-06-25 MED ORDER — BUSPIRONE HCL 15 MG PO TABS
15.0000 mg | ORAL_TABLET | Freq: Two times a day (BID) | ORAL | Status: DC
  Administered 2021-06-25 – 2021-06-26 (×2): 15 mg via ORAL
  Filled 2021-06-25 (×4): qty 1

## 2021-06-25 MED ORDER — LACTATED RINGERS IV SOLN: INTRAVENOUS | Status: DC

## 2021-06-25 MED ORDER — HYDROCODONE-ACETAMINOPHEN 5-325 MG PO TABS
1.0000 | ORAL_TABLET | ORAL | Status: DC | PRN
  Administered 2021-06-25 – 2021-06-26 (×4): 2 via ORAL
  Filled 2021-06-25 (×4): qty 2

## 2021-06-25 MED ORDER — SODIUM CHLORIDE (PF) 0.9 % IJ SOLN: INTRAMUSCULAR | Status: DC | PRN

## 2021-06-25 MED ORDER — CIPROFLOXACIN IN D5W 400 MG/200ML IV SOLN
INTRAVENOUS | Status: AC
  Filled 2021-06-25: qty 200

## 2021-06-25 MED ORDER — CITALOPRAM HYDROBROMIDE 20 MG PO TABS
40.0000 mg | ORAL_TABLET | Freq: Every day | ORAL | Status: DC
  Administered 2021-06-25 – 2021-06-26 (×2): 40 mg via ORAL
  Filled 2021-06-25 (×2): qty 2

## 2021-06-25 MED ORDER — FAMOTIDINE 20 MG PO TABS
ORAL_TABLET | ORAL | Status: AC
  Administered 2021-06-25: 20 mg via ORAL
  Filled 2021-06-25: qty 1

## 2021-06-25 MED ORDER — ONDANSETRON HCL 4 MG/2ML IJ SOLN
INTRAMUSCULAR | Status: AC
  Administered 2021-06-25: 4 mg via INTRAVENOUS
  Filled 2021-06-25: qty 2

## 2021-06-25 MED ORDER — MICROFIBRILLAR COLL HEMOSTAT EX POWD: CUTANEOUS | Status: DC | PRN

## 2021-06-25 MED ORDER — GABAPENTIN 300 MG PO CAPS
300.0000 mg | ORAL_CAPSULE | Freq: Two times a day (BID) | ORAL | Status: DC
  Administered 2021-06-25 – 2021-06-26 (×2): 300 mg via ORAL
  Filled 2021-06-25 (×2): qty 1

## 2021-06-25 MED ORDER — PROPOFOL 500 MG/50ML IV EMUL
INTRAVENOUS | Status: DC | PRN
  Administered 2021-06-25: 20 ug/kg/min via INTRAVENOUS

## 2021-06-25 MED ORDER — PROPOFOL 10 MG/ML IV BOLUS
INTRAVENOUS | Status: DC | PRN
  Administered 2021-06-25: 50 mg via INTRAVENOUS
  Administered 2021-06-25: 200 mg via INTRAVENOUS

## 2021-06-25 MED ORDER — OXYCODONE HCL 5 MG PO TABS
ORAL_TABLET | ORAL | Status: AC
  Filled 2021-06-25: qty 2

## 2021-06-25 MED ORDER — ROCURONIUM BROMIDE 10 MG/ML (PF) SYRINGE
PREFILLED_SYRINGE | INTRAVENOUS | Status: AC
  Filled 2021-06-25: qty 10

## 2021-06-25 MED ORDER — ONDANSETRON 4 MG PO TBDP: 4.0000 mg | ORAL_TABLET | Freq: Four times a day (QID) | ORAL | Status: DC | PRN

## 2021-06-25 MED ORDER — SODIUM CHLORIDE (PF) 0.9 % IJ SOLN
INTRAMUSCULAR | Status: AC
  Filled 2021-06-25: qty 50

## 2021-06-25 MED ORDER — SODIUM CHLORIDE 0.9 % IR SOLN
Status: DC | PRN
  Administered 2021-06-25: 400 mL

## 2021-06-25 MED ORDER — GLYCOPYRROLATE 0.2 MG/ML IJ SOLN
INTRAMUSCULAR | Status: AC
  Filled 2021-06-25: qty 1

## 2021-06-25 MED ORDER — BUPIVACAINE LIPOSOME 1.3 % IJ SUSP
INTRAMUSCULAR | Status: AC
  Filled 2021-06-25: qty 20

## 2021-06-25 MED ORDER — DIAZEPAM 2 MG PO TABS
2.0000 mg | ORAL_TABLET | Freq: Four times a day (QID) | ORAL | Status: DC | PRN
  Administered 2021-06-25 – 2021-06-26 (×2): 2 mg via ORAL
  Filled 2021-06-25: qty 1

## 2021-06-25 MED ORDER — ONDANSETRON HCL 4 MG/2ML IJ SOLN
INTRAMUSCULAR | Status: AC
  Filled 2021-06-25: qty 2

## 2021-06-25 MED ORDER — DEXMEDETOMIDINE (PRECEDEX) IN NS 20 MCG/5ML (4 MCG/ML) IV SYRINGE
PREFILLED_SYRINGE | INTRAVENOUS | Status: AC
  Filled 2021-06-25: qty 5

## 2021-06-25 MED ORDER — FENTANYL CITRATE (PF) 100 MCG/2ML IJ SOLN
INTRAMUSCULAR | Status: DC | PRN
  Administered 2021-06-25 (×4): 50 ug via INTRAVENOUS

## 2021-06-25 MED ORDER — ACETAMINOPHEN 10 MG/ML IV SOLN
INTRAVENOUS | Status: AC
  Filled 2021-06-25: qty 100

## 2021-06-25 MED ORDER — METHYLENE BLUE 0.5 % INJ SOLN
INTRAVENOUS | Status: AC
  Filled 2021-06-25: qty 10

## 2021-06-25 MED ORDER — ROCURONIUM BROMIDE 100 MG/10ML IV SOLN
INTRAVENOUS | Status: DC | PRN
Start: 2021-06-25 — End: 2021-06-25
  Administered 2021-06-25: 50 mg via INTRAVENOUS

## 2021-06-25 MED ORDER — BUPIVACAINE-EPINEPHRINE (PF) 0.5% -1:200000 IJ SOLN
INTRAMUSCULAR | Status: AC
  Filled 2021-06-25: qty 30

## 2021-06-25 MED ORDER — CHLORHEXIDINE GLUCONATE CLOTH 2 % EX PADS: 6.0000 | MEDICATED_PAD | Freq: Once | CUTANEOUS | Status: DC

## 2021-06-25 MED ORDER — CHLORHEXIDINE GLUCONATE 0.12 % MT SOLN: 15.0000 mL | Freq: Once | OROMUCOSAL | Status: AC

## 2021-06-25 MED ORDER — TECHNETIUM TC 99M TILMANOCEPT KIT
1.0000 | PACK | Freq: Once | INTRAVENOUS | Status: AC | PRN
  Administered 2021-06-25: 1.02 via INTRADERMAL

## 2021-06-25 MED ORDER — PANTOPRAZOLE SODIUM 40 MG IV SOLR
40.0000 mg | Freq: Every day | INTRAVENOUS | Status: DC
  Administered 2021-06-25: 40 mg via INTRAVENOUS
  Filled 2021-06-25: qty 40

## 2021-06-25 MED ORDER — MIDAZOLAM HCL 2 MG/2ML IJ SOLN
INTRAMUSCULAR | Status: AC
  Filled 2021-06-25: qty 4

## 2021-06-25 MED ORDER — FENTANYL CITRATE (PF) 100 MCG/2ML IJ SOLN
INTRAMUSCULAR | Status: AC
  Filled 2021-06-25: qty 2

## 2021-06-25 MED ORDER — SODIUM CHLORIDE 0.9 % IV SOLN: INTRAVENOUS | Status: DC

## 2021-06-25 MED ORDER — CHLORHEXIDINE GLUCONATE 0.12 % MT SOLN
OROMUCOSAL | Status: AC
  Administered 2021-06-25: 15 mL via OROMUCOSAL
  Filled 2021-06-25: qty 15

## 2021-06-25 MED ORDER — KETAMINE HCL 50 MG/5ML IJ SOSY
PREFILLED_SYRINGE | INTRAMUSCULAR | Status: AC
  Filled 2021-06-25: qty 5

## 2021-06-25 MED ORDER — ONDANSETRON HCL 4 MG/2ML IJ SOLN
INTRAMUSCULAR | Status: DC | PRN
  Administered 2021-06-25: 4 mg via INTRAVENOUS

## 2021-06-25 MED ORDER — KETOROLAC TROMETHAMINE 30 MG/ML IJ SOLN
INTRAMUSCULAR | Status: AC
  Filled 2021-06-25: qty 1

## 2021-06-25 MED ORDER — HEMOSTATIC AGENTS (NO CHARGE) OPTIME
TOPICAL | Status: DC | PRN
  Administered 2021-06-25: 1 via TOPICAL

## 2021-06-25 MED ORDER — PROPOFOL 10 MG/ML IV BOLUS
INTRAVENOUS | Status: AC
  Filled 2021-06-25: qty 20

## 2021-06-25 MED ORDER — APREPITANT 40 MG PO CAPS
ORAL_CAPSULE | ORAL | Status: AC
  Filled 2021-06-25: qty 1

## 2021-06-25 MED ORDER — SUGAMMADEX SODIUM 200 MG/2ML IV SOLN
INTRAVENOUS | Status: DC | PRN
  Administered 2021-06-25: 200 mg via INTRAVENOUS

## 2021-06-25 MED ORDER — DEXMEDETOMIDINE (PRECEDEX) IN NS 20 MCG/5ML (4 MCG/ML) IV SYRINGE
PREFILLED_SYRINGE | INTRAVENOUS | Status: DC | PRN
  Administered 2021-06-25 (×2): 8 ug via INTRAVENOUS
  Administered 2021-06-25: 4 ug via INTRAVENOUS
  Administered 2021-06-25 (×2): 8 ug via INTRAVENOUS

## 2021-06-25 MED ORDER — FENTANYL CITRATE (PF) 100 MCG/2ML IJ SOLN
25.0000 ug | INTRAMUSCULAR | Status: DC | PRN
  Administered 2021-06-25 (×4): 25 ug via INTRAVENOUS

## 2021-06-25 MED ORDER — CEFAZOLIN SODIUM 1 G IJ SOLR
INTRAMUSCULAR | Status: AC
  Filled 2021-06-25: qty 20

## 2021-06-25 MED ORDER — ENOXAPARIN SODIUM 40 MG/0.4ML IJ SOSY
40.0000 mg | PREFILLED_SYRINGE | INTRAMUSCULAR | Status: DC
  Administered 2021-06-26: 40 mg via SUBCUTANEOUS
  Filled 2021-06-25: qty 0.4

## 2021-06-25 MED ORDER — ONDANSETRON HCL 4 MG/2ML IJ SOLN: 4.0000 mg | Freq: Four times a day (QID) | INTRAMUSCULAR | Status: DC | PRN

## 2021-06-25 MED ORDER — FAMOTIDINE 20 MG PO TABS: 20.0000 mg | ORAL_TABLET | Freq: Once | ORAL | Status: AC

## 2021-06-25 MED ORDER — KETAMINE HCL 10 MG/ML IJ SOLN
INTRAMUSCULAR | Status: DC | PRN
  Administered 2021-06-25 (×2): 10 mg via INTRAVENOUS
  Administered 2021-06-25: 20 mg via INTRAVENOUS

## 2021-06-25 MED ORDER — DEXAMETHASONE SODIUM PHOSPHATE 10 MG/ML IJ SOLN
INTRAMUSCULAR | Status: DC | PRN
  Administered 2021-06-25: 10 mg via INTRAVENOUS

## 2021-06-25 MED ORDER — MORPHINE SULFATE (PF) 4 MG/ML IV SOLN
4.0000 mg | INTRAVENOUS | Status: DC | PRN
Start: 2021-06-25 — End: 2021-06-26
  Administered 2021-06-25 – 2021-06-26 (×5): 4 mg via INTRAVENOUS
  Filled 2021-06-25 (×4): qty 1

## 2021-06-25 MED ORDER — MIDAZOLAM HCL 2 MG/2ML IJ SOLN
INTRAMUSCULAR | Status: DC | PRN
  Administered 2021-06-25: 4 mg via INTRAVENOUS

## 2021-06-25 MED ORDER — APREPITANT 40 MG PO CAPS
40.0000 mg | ORAL_CAPSULE | Freq: Once | ORAL | Status: AC
  Administered 2021-06-25: 40 mg via ORAL

## 2021-06-25 MED ORDER — STERILE WATER FOR IRRIGATION IR SOLN
Status: DC | PRN
  Administered 2021-06-25: 800 mL

## 2021-06-25 MED ORDER — SODIUM CHLORIDE FLUSH 0.9 % IV SOLN
INTRAVENOUS | Status: AC
  Filled 2021-06-25: qty 10

## 2021-06-25 MED ORDER — BUPIVACAINE-EPINEPHRINE (PF) 0.25% -1:200000 IJ SOLN
INTRAMUSCULAR | Status: AC
  Filled 2021-06-25: qty 30

## 2021-06-25 MED ORDER — KETOROLAC TROMETHAMINE 30 MG/ML IJ SOLN
INTRAMUSCULAR | Status: DC | PRN
  Administered 2021-06-25: 30 mg via INTRAVENOUS

## 2021-06-25 MED ORDER — MORPHINE SULFATE (PF) 4 MG/ML IV SOLN
INTRAVENOUS | Status: AC
  Filled 2021-06-25: qty 1

## 2021-06-25 MED ORDER — CIPROFLOXACIN IN D5W 400 MG/200ML IV SOLN
400.0000 mg | INTRAVENOUS | Status: AC
  Administered 2021-06-25: 400 mg via INTRAVENOUS

## 2021-06-25 MED ORDER — SODIUM CHLORIDE 0.9 % IR SOLN
Status: DC | PRN
  Administered 2021-06-25: 200 mL

## 2021-06-25 MED ORDER — FENTANYL CITRATE (PF) 100 MCG/2ML IJ SOLN
INTRAMUSCULAR | Status: AC
  Administered 2021-06-25: 25 ug via INTRAVENOUS
  Filled 2021-06-25: qty 2

## 2021-06-25 MED ORDER — ACETAMINOPHEN 10 MG/ML IV SOLN
INTRAVENOUS | Status: DC | PRN
  Administered 2021-06-25: 1000 mg via INTRAVENOUS

## 2021-06-25 MED ORDER — BUPIVACAINE-EPINEPHRINE 0.25% -1:200000 IJ SOLN
INTRAMUSCULAR | Status: DC | PRN
  Administered 2021-06-25: 50 mL

## 2021-06-25 MED ORDER — CLONAZEPAM 0.5 MG PO TABS: 0.5000 mg | ORAL_TABLET | Freq: Three times a day (TID) | ORAL | Status: DC | PRN

## 2021-06-25 MED ORDER — LIDOCAINE HCL (PF) 2 % IJ SOLN
INTRAMUSCULAR | Status: AC
  Filled 2021-06-25: qty 5

## 2021-06-25 MED ORDER — PROPOFOL 10 MG/ML IV BOLUS
INTRAVENOUS | Status: AC
  Filled 2021-06-25: qty 40

## 2021-06-25 MED ORDER — ORAL CARE MOUTH RINSE: 15.0000 mL | Freq: Once | OROMUCOSAL | Status: AC

## 2021-06-25 MED ORDER — NEOMYCIN-POLYMYXIN B GU 40-200000 IR SOLN
Status: AC
  Filled 2021-06-25: qty 2

## 2021-06-25 MED ORDER — LIDOCAINE HCL (CARDIAC) PF 100 MG/5ML IV SOSY
PREFILLED_SYRINGE | INTRAVENOUS | Status: DC | PRN
  Administered 2021-06-25: 100 mg via INTRAVENOUS

## 2021-06-25 MED ORDER — DEXAMETHASONE SODIUM PHOSPHATE 10 MG/ML IJ SOLN
INTRAMUSCULAR | Status: AC
  Filled 2021-06-25: qty 1

## 2021-06-25 MED ORDER — DIAZEPAM 2 MG PO TABS
ORAL_TABLET | ORAL | Status: AC
  Filled 2021-06-25: qty 1

## 2021-06-25 MED ORDER — CEFAZOLIN SODIUM-DEXTROSE 2-4 GM/100ML-% IV SOLN
2.0000 g | INTRAVENOUS | Status: AC
  Administered 2021-06-25 (×2): 2 g via INTRAVENOUS

## 2021-06-25 MED ORDER — PROMETHAZINE HCL 25 MG/ML IJ SOLN: 6.2500 mg | INTRAMUSCULAR | Status: DC | PRN

## 2021-06-25 MED ORDER — CEFAZOLIN SODIUM-DEXTROSE 2-4 GM/100ML-% IV SOLN
INTRAVENOUS | Status: AC
  Filled 2021-06-25: qty 100

## 2021-06-25 MED ORDER — OXYCODONE HCL 5 MG PO TABS
10.0000 mg | ORAL_TABLET | ORAL | Status: AC
  Administered 2021-06-25: 10 mg via ORAL

## 2021-06-25 SURGICAL SUPPLY — 83 items
BAG DECANTER FOR FLEXI CONT (MISCELLANEOUS) ×2 IMPLANT
BINDER BREAST LRG (GAUZE/BANDAGES/DRESSINGS) ×1 IMPLANT
BIOPATCH WHT 1IN DISK W/4.0 H (GAUZE/BANDAGES/DRESSINGS) ×4 IMPLANT
BLADE BOVIE TIP EXT 4 (BLADE) ×2 IMPLANT
BLADE SURG 15 STRL LF DISP TIS (BLADE) ×2 IMPLANT
BLADE SURG 15 STRL SS (BLADE) ×2
BNDG GAUZE ELAST 4 BULKY (GAUZE/BANDAGES/DRESSINGS) ×4 IMPLANT
BULB RESERV EVAC DRAIN JP 100C (MISCELLANEOUS) ×5 IMPLANT
CHLORAPREP W/TINT 26 (MISCELLANEOUS) ×4 IMPLANT
CNTNR SPEC 2.5X3XGRAD LEK (MISCELLANEOUS) ×2
CONT SPEC 4OZ STER OR WHT (MISCELLANEOUS) ×2
CONTAINER SPEC 2.5X3XGRAD LEK (MISCELLANEOUS) ×1 IMPLANT
DERMABOND ADVANCED (GAUZE/BANDAGES/DRESSINGS) ×4
DERMABOND ADVANCED .7 DNX12 (GAUZE/BANDAGES/DRESSINGS) ×4 IMPLANT
DEVICE DUBIN SPECIMEN MAMMOGRA (MISCELLANEOUS) ×2 IMPLANT
DRAIN CHANNEL JP 19F (MISCELLANEOUS) ×4 IMPLANT
DRAPE LAPAROTOMY TRNSV 106X77 (MISCELLANEOUS) ×2 IMPLANT
DRSG GAUZE FLUFF 36X18 (GAUZE/BANDAGES/DRESSINGS) ×2 IMPLANT
DRSG OPSITE POSTOP 4X8 (GAUZE/BANDAGES/DRESSINGS) ×2 IMPLANT
ELECT BLADE 6.5 EXT (BLADE) ×2 IMPLANT
ELECT CAUTERY BLADE 6.4 (BLADE) ×3 IMPLANT
ELECT CAUTERY BLADE TIP 2.5 (TIP) ×2
ELECT REM PT RETURN 9FT ADLT (ELECTROSURGICAL) ×2
ELECTRODE CAUTERY BLDE TIP 2.5 (TIP) ×1 IMPLANT
ELECTRODE REM PT RTRN 9FT ADLT (ELECTROSURGICAL) ×1 IMPLANT
GAUZE SPONGE 4X4 12PLY STRL (GAUZE/BANDAGES/DRESSINGS) ×2 IMPLANT
GLOVE SURG ENC MOIS LTX SZ6.5 (GLOVE) ×12 IMPLANT
GLOVE SURG UNDER POLY LF SZ6.5 (GLOVE) ×2 IMPLANT
GOWN STRL REUS W/ TWL LRG LVL3 (GOWN DISPOSABLE) ×6 IMPLANT
GOWN STRL REUS W/TWL LRG LVL3 (GOWN DISPOSABLE) ×4
GRAFT FLEX HD 6X16 PLIABLE (Tissue) ×2 IMPLANT
HEMOSTAT ARISTA ABSORB 1G (HEMOSTASIS) ×1 IMPLANT
IMPL EXPANDER BREAST 535CC (Breast) IMPLANT
IMPLANT BREAST 535CC (Breast) ×2 IMPLANT
IMPLANT EXPANDER BREAST 535CC (Breast) ×2 IMPLANT
IV NS 1000ML (IV SOLUTION) ×1
IV NS 1000ML BAXH (IV SOLUTION) IMPLANT
IV NS 500ML (IV SOLUTION) ×1
IV NS 500ML BAXH (IV SOLUTION) IMPLANT
KIT FILL ASEPTIC TRANSFER (MISCELLANEOUS) ×2 IMPLANT
KIT TURNOVER KIT A (KITS) ×2 IMPLANT
LABEL OR SOLS (LABEL) ×2 IMPLANT
LOCALIZER INSTRUMENT COVER KIT ×1 IMPLANT
MANIFOLD NEPTUNE II (INSTRUMENTS) ×4 IMPLANT
NDL FILTER BLUNT 18X1 1/2 (NEEDLE) ×2 IMPLANT
NDL SAFETY ECLIPSE 18X1.5 (NEEDLE) IMPLANT
NEEDLE FILTER BLUNT 18X 1/2SAF (NEEDLE) ×1
NEEDLE FILTER BLUNT 18X1 1/2 (NEEDLE) ×1 IMPLANT
NEEDLE HYPO 18GX1.5 SHARP (NEEDLE) ×1
NEEDLE HYPO 22GX1.5 SAFETY (NEEDLE) ×2 IMPLANT
PACK BASIN MAJOR ARMC (MISCELLANEOUS) ×4 IMPLANT
PACK UNIVERSAL (MISCELLANEOUS) ×1 IMPLANT
PAD ABD DERMACEA PRESS 5X9 (GAUZE/BANDAGES/DRESSINGS) ×8 IMPLANT
PIN SAFETY STRL (MISCELLANEOUS) ×2 IMPLANT
SET LOCALIZER 20 PROBE US (MISCELLANEOUS) ×1 IMPLANT
SLEVE PROBE SENORX GAMMA FIND (MISCELLANEOUS) ×2 IMPLANT
SPONGE T-LAP 18X18 ~~LOC~~+RFID (SPONGE) ×11 IMPLANT
STRIP CLOSURE SKIN 1/2X4 (GAUZE/BANDAGES/DRESSINGS) ×1 IMPLANT
SUT ETHILON 3-0 FS-10 30 BLK (SUTURE) ×2
SUT MNCRL 3-0 UNDYED SH (SUTURE) ×2 IMPLANT
SUT MNCRL 4-0 (SUTURE) ×4
SUT MNCRL 4-0 27XMFL (SUTURE) ×4
SUT MNCRL+ 5-0 UNDYED PC-3 (SUTURE) ×2 IMPLANT
SUT MONOCRYL 3-0 UNDYED (SUTURE) ×2
SUT MONOCRYL 5-0 (SUTURE) ×2
SUT PDS AB 2-0 CT1 27 (SUTURE) ×2 IMPLANT
SUT PDS PLUS 2 (SUTURE) ×8
SUT PDS PLUS AB 2-0 CT-1 (SUTURE) ×6 IMPLANT
SUT PROLENE 3 0 FS 2 (SUTURE) ×1 IMPLANT
SUT SILK 2 0 SH (SUTURE) ×2 IMPLANT
SUT SILK 3 0 SH 30 (SUTURE) ×2 IMPLANT
SUT SILK 3-0 (SUTURE) ×2 IMPLANT
SUT SILK 4 0 SH (SUTURE) ×4 IMPLANT
SUT VIC AB 3-0 SH 8-18 (SUTURE) ×2 IMPLANT
SUT VICRYL+ 3-0 144IN (SUTURE) ×2 IMPLANT
SUTURE EHLN 3-0 FS-10 30 BLK (SUTURE) ×1 IMPLANT
SUTURE MNCRL 4-0 27XMF (SUTURE) ×4 IMPLANT
SYR 10ML LL (SYRINGE) ×4 IMPLANT
SYR BULB IRRIG 60ML STRL (SYRINGE) ×4 IMPLANT
TOWEL OR 17X26 4PK STRL BLUE (TOWEL DISPOSABLE) ×2 IMPLANT
TRAY FOLEY MTR SLVR 16FR STAT (SET/KITS/TRAYS/PACK) ×1 IMPLANT
WATER STERILE IRR 1000ML POUR (IV SOLUTION) ×2 IMPLANT
WATER STERILE IRR 500ML POUR (IV SOLUTION) ×4 IMPLANT

## 2021-06-25 NOTE — Discharge Instructions (Addendum)
INSTRUCTIONS FOR AFTER SURGERY   You will likely have some questions about what to expect following your operation.  The following information will help you and your family understand what to expect when you are discharged from the hospital.  Following these guidelines will help ensure a smooth recovery and reduce risks of complications.  Postoperative instructions include information on: diet, wound care, medications and physical activity.  AFTER SURGERY Expect to go home after the procedure.  In some cases, you may need to spend one night in the hospital for observation.  DIET This surgery does not require a specific diet.  However, I have to mention that the healthier you eat the better your body can start healing. It is important to increasing your protein intake.  This means limiting the foods with added sugar.  Focus on fruits and vegetables and some meat. It is very important to drink water after your surgery.  If your urine is bright yellow, then it is concentrated, and you need to drink more water.  As a general rule after surgery, you should have 8 ounces of water every hour while awake.  If you find you are persistently nauseated or unable to take in liquids let us know.  NO TOBACCO USE or EXPOSURE.  This will slow your healing process and increase the risk of a wound.  WOUND CARE If you have a drain: Clean with baby wipes for 3-5 days and then you can shower.  If you have a binder you may remove it to shower and then put it back on. If you have steri-strips / tape directly attached to your skin leave them in place. It is OK to get these wet.  No baths, pools or hot tubs for two weeks. We close your incision to leave the smallest and best-looking scar. No ointment or creams on your incisions until given the go ahead.  Especially not Neosporin (Too many skin reactions with this one).  A few weeks after surgery you can use Mederma and start massaging the scar. We ask you to wear your binder or  sports bra for the first 6 weeks around the clock, including while sleeping. This provides added comfort and helps reduce the fluid accumulation at the surgery site.  ACTIVITY No heavy lifting until cleared by the doctor.  It is OK to walk and climb stairs. In fact, moving your legs is very important to decrease your risk of a blood clot.  It will also help keep you from getting deconditioned.  Every 1 to 2 hours get up and walk for 5 minutes. This will help with a quicker recovery back to normal.  Let pain be your guide so you don't do too much.  NO, you cannot do the spring cleaning and don't plan on taking care of anyone else.  This is your time for TLC.   WORK Everyone returns to work at different times. As a rough guide, most people take at least 1 - 2 weeks off prior to returning to work. If you need documentation for your job, bring the forms to your postoperative follow up visit.  DRIVING Arrange for someone to bring you home from the hospital.  You may be able to drive a few days after surgery but not while taking any narcotics or valium.  BOWEL MOVEMENTS Constipation can occur after anesthesia and while taking pain medication.  It is important to stay ahead for your comfort.  We recommend taking Milk of Magnesia (2 tablespoons; twice   a day) while taking the pain pills.  SEROMA This is fluid your body tried to put in the surgical site.  This is normal but if it creates excessive pain and swelling let us know.  It usually decreases in a few weeks.  MEDICATIONS and PAIN CONTROL At your preoperative visit for you history and physical you were given the following medications: An antibiotic: Start this medication when you get home and take according to the instructions on the bottle. Zofran 4 mg:  This is to treat nausea and vomiting.  You can take this every 6 hours as needed and only if needed. Norco (hydrocodone/acetaminophen) 5/325 mg:  This is only to be used after you have taken the  motrin or the tylenol. Every 8 hours as needed. Over the counter Medication to take: Ibuprofen (Motrin) 600 mg:  Take this every 6 hours.  If you have additional pain then take 500 mg of the tylenol.  Only take the Norco after you have tried these two. Miralax or stool softener of choice: Take this according to the bottle if you take the Norco.  WHEN TO CALL Call your surgeon's office if any of the following occur:  Fever 101 degrees F or greater  Excessive bleeding or fluid from the incision site.  Pain that increases over time without aid from the medications  Redness, warmth, or pus draining from incision sites  Persistent nausea or inability to take in liquids  Severe misshapen area that underwent the operation.  [facility]  What is the benefit of having a drain?  During surgery your tissue layers are separated.  This raw surface stimulates your body to fill the space with serous fluid.  This is normal but you don't want that fluid to collect and prevent healing.  A fluid collection can also become infected.  The Westyn Keatley-Pratt (JP) drain is used to eliminate this collection of fluid and allow the tissue to heal together.    Abubakr Wieman-Pratt (JP) bulb    How to care for your drainage and suction unit at home Your drainage catheter will be connected to a collection device. The vacuum caused when the device is compressed allows drainage to collect in the device.    Wash your hands with soap and water before and after touching the system. Empty the JP drain every 12 hours once you get home from your procedure. Record the fluid amount on the record sheet included. Start with stripping the drain tube to push the clots or excess fluid to the bulb.  Do this by pinching the tube with one hand near your skin.  Then with the other hand squeeze the tubing and work it toward the bulb.  This should be done several times a day.  This may collapse the tube which will correct on its  own.   Use a safety pin to attach your collection device to your clothing so there is no tension on the insertion site.   If you have drainage at the skin insertion site, you can apply a gauze dressing and secure it with tape. If the drain falls out, apply a gauze dressing over the drain insertion site and secure with tape.   To empty the collection device:   Release the stopper on the top of the collection unit (bulb).  Pour contents into a measuring container such as a plastic medicine cup.  Record the day and amount of drainage on the attached sheet. This should be done at least   twice a day.    To compress the Yani Coventry-Pratt Bulb:  Release the stopper at the top of the bulb. Squeeze the bulb tightly in your fist, squeezing air out of the bulb.  Replace the stopper while the bulb is compressed.  Be careful not to spill the contents when squeezing the bulb. The drainage will start bright red and turn to pink and then yellow with time. IMPORTANT: If the bulb is not squeezed before adding the stopper it will not draw out the fluid.  Care for the JP drain site and your skin daily:  You may shower three days after surgery. Secure the drain to a ribbon or cloth around your waist while showering so it does not pull out while showering. Be sure your hands are cleaned with soap and water. Use a clean wet cotton swab to clean the skin around the drain site.  Use another cotton swab to place Vaseline or antibiotic ointment on the skin around the drain.     Contact your physician if any of the following occur:  The fluid in the bulb becomes cloudy. Your temperature is greater than 101.4.  The incision opens. If you have drainage at the skin insertion site, you can apply a gauze dressing and secure it with tape. If the drain falls out, apply a gauze dressing over the drain insertion site and secure with tape.  You will usually have more drainage when you are active than while you rest or are  asleep. If the drainage increases significantly or is bloody call the physician                             Bring this record with you to each office visit Date  Drainage Volume  Date   Drainage volume                                                                                                                                                                                          AMBULATORY SURGERY  DISCHARGE INSTRUCTIONS   The drugs that you were given will stay in your system until tomorrow so for the next 24 hours you should not:  Drive an automobile Make any legal decisions Drink any alcoholic beverage   You may resume regular meals tomorrow.  Today it is better to start with liquids and gradually work up to solid foods.  You may eat anything you prefer, but it is better to start with liquids, then soup and crackers, and gradually work up to solid foods.   Please notify your doctor immediately if you  have any unusual bleeding, trouble breathing, redness and pain at the surgery site, drainage, fever, or pain not relieved by medication.    Additional Instructions:   Please contact your physician with any problems or Same Day Surgery at [phone], Monday through Friday 6 am to 4 pm, or [place] at [facility] number at [phone].

## 2021-06-25 NOTE — H&P (Signed)
PATIENT PROFILE: Vickie Mathis is a 30 y.o. female who presents to the Clinic for surgical discussion of left breast cancer.  PCP: Vickie Nancy, MD  HISTORY OF PRESENT ILLNESS: Vickie Mathis withstage IIIb triple negative breast cancer of the left breast cT2 N1 M0. She is s/p CarboTaxol chemotherapy with Keytruda neoadjuvant. She is after cycle 3 of neoadjuvant AC Keytruda chemotherapy. She has been tolerating chemotherapy adequately. Patient has had mammogram and ultrasound that shows decreasing size of the left breast mass and the left axillary lymph nodes. She responded to have last chemotherapy on 05/20/2021.  Patient today denies any chest pain or shortness of breath. Patient does feel fatigued after chemotherapy but she has been able to recover between courses. Today she denies any pain. There is no pain radiation. There is no alleviating or aggravating factor.  PROBLEM LIST: Problem List Never Reviewed  Noted  Situational mixed anxiety and depressive disorder 07/01/2020  Breast mass, left 07/01/2020    GENERAL REVIEW OF SYSTEMS:   General ROS: negative for - chills, fever, weight gain or weight loss. Positive for fatigue Allergy and Immunology ROS: negative for - hives  Hematological and Lymphatic ROS: negative for - bleeding problems or bruising, negative for palpable nodes Endocrine ROS: negative for - heat or cold intolerance, hair changes Respiratory ROS: negative for - cough, shortness of breath or wheezing Cardiovascular ROS: no chest pain or palpitations GI ROS: negative for nausea, vomiting, abdominal pain, diarrhea, constipation Musculoskeletal ROS: negative for - joint swelling or muscle pain Neurological ROS: negative for - confusion, syncope Dermatological ROS: negative for pruritus and rash Psychiatric: negative for anxiety, depression, difficulty sleeping and memory loss  MEDICATIONS: Current Outpatient Medications  Medication Sig Dispense Refill    acetaminophen (TYLENOL) 325 MG tablet as needed   albuterol 90 mcg/actuation inhaler Inhale 2 inhalations into the lungs every 6 (six) hours as needed for Wheezing 1 Inhaler 0   buPROPion (WELLBUTRIN XL) 150 MG XL tablet Take 1 tablet (150 mg total) by mouth once daily 30 tablet 1   busPIRone (BUSPAR) 15 MG tablet TAKE 1 TABLET BY MOUTH 2 TIMES DAILY. 180 tablet 1   citalopram (CELEXA) 40 MG tablet Take 1 tablet (40 mg total) by mouth once daily for 60 days 30 tablet 1   clonazePAM (KLONOPIN) 0.5 MG tablet TAKE 1 TABLET BY MOUTH THREE TIMES DAILY AS NEEDED FOR ANXIETY-AVOID DAILY USE- 15 tablet 0   cloNIDine HCL (CATAPRES) 0.1 MG tablet Take 0.1 mg by mouth once daily as needed   dexAMETHasone (DECADRON) 4 MG tablet Take by mouth   folic acid (FOLVITE) 1 MG tablet Take 2,000 mcg by mouth once daily   ibuprofen (MOTRIN) 200 MG tablet as needed   JUNEL 1/20, 21, 1-20 mg-mcg tablet Take 1 tablet by mouth once daily   lidocaine-prilocaine (EMLA) cream Apply to affected area once   LORazepam (ATIVAN) 0.5 MG tablet TAKE 1 TABLET (0.5 MG TOTAL) BY MOUTH EVERY 6 (SIX) HOURS AS NEEDED (NAUSEA OR VOMITING).   norethindrone-ethinyl estradiol (JUNEL FE 1/20) 1 mg-20 mcg (21)/75 mg (7) tablet Take 1 tablet by mouth once daily   OLANZapine (ZYPREXA) 10 MG tablet Take by mouth   ondansetron (ZOFRAN) 8 MG tablet Take by mouth   predniSONE (DELTASONE) 10 MG tablet Take 1 tablet (10 mg total) by mouth once daily 10 tablet 0   prochlorperazine (COMPAZINE) 10 MG tablet Take by mouth   triamcinolone 0.5 % ointment Apply 1 Application topically 2 (two) times  daily   No current facility-administered medications for this visit.   ALLERGIES: Carboplatin, Amoxil [amoxicillin], and Sulfa (sulfonamide antibiotics)  PAST MEDICAL HISTORY: Past Medical History:  Diagnosis Date   Absence of varicose veins   Anxiety   Breast cancer (CMS-HCC)   Depression   PAST SURGICAL HISTORY: Past Surgical History:  Procedure  Laterality Date   APPENDECTOMY 2018 or 19    FAMILY HISTORY: Family History  Problem Relation Age of Onset   Thyroid disease Mother   Depression Mother   Obesity Mother   High blood pressure (Hypertension) Father   Diabetes type II Father   Obesity Father    SOCIAL HISTORY: Social History   Socioeconomic History   Marital status: Married  Tobacco Use   Smoking status: Never Smoker   Smokeless tobacco: Never Used  Scientific laboratory technician Use: Never used  Substance and Sexual Activity   Alcohol use: Yes  Comment: a little bit here and there   Drug use: Yes  Frequency: 4.0 times per week  Types: Marijuana   PHYSICAL EXAM: Vitals:  05/06/21 1404  BP: 134/70  Pulse: 106   Body mass index is 29.68 kg/m. Weight: 91.2 kg (201 lb)   GENERAL: Alert, active, oriented x3  HEENT: Pupils equal reactive to light. Extraocular movements are intact. Sclera clear. Palpebral conjunctiva normal red color.Pharynx clear.  NECK: Supple with no palpable mass and no adenopathy.  LUNGS: Sound clear with no rales rhonchi or wheezes.  HEART: Regular rhythm S1 and S2 without murmur.  BREAST: Both breasts were examined in sitting and supine position. There is no palpable mass, there is no skin changes. There is no nipple retraction or discharge. There is no axillary adenopathy clinically bilaterally.  ABDOMEN: Soft and depressible, nontender with no palpable mass, no hepatomegaly.  EXTREMITIES: Well-developed well-nourished symmetrical with no dependent edema.  NEUROLOGICAL: Awake alert oriented, facial expression symmetrical, moving all extremities.  REVIEW OF DATA: I have reviewed the following data today: No visits with results within 3 Month(s) from this visit.  Latest known visit with results is:  Ancillary Orders on 10/17/2020  Component Date Value   SARS-COV-2, NAA - [facility] 10/17/2020 Not Detected   Xpress Strep A, PCR 10/17/2020 Not Detected   SARS-CoV-2, NAA 2 DAY TA*  10/17/2020 Performed    ASSESSMENT: Vickie Mathis is a 30 y.o. female presenting for surgical evaluation for left breast cancer.   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 She completed CarboTaxol chemotherapy with Keytruda neoadjuvant on 05/20/2021. She has been tolerating chemotherapy fairly well. Last CBC shows a white blood cell count of 4 with hemoglobin of 10 and adequate platelets. I personally evaluated the images of the breast ultrasound and mammogram with significant decrease in size of the breast mass and axillary adenopathy. Surgical alternatives were discussed with patient including partial vs total mastectomy. Surgical technique and post operative care was discussed with patient. Risk of surgery was discussed with patient including but not limited to: wound infection, seroma, hematoma, brachial plexopathy, mondor's disease (thrombosis of small veins of breast), chronic wound pain, breast lymphedema, altered sensation to the nipple and cosmesis among others. Patient elected to proceed with bilateral mastectomy with left sentinel needle biopsy versus axillary node dissection and immediate reconstruction by Dr. Marla Roe.  Breast cancer metastasized to axillary lymph node, left (CMS-HCC) [C50.912, C77.3]  PLAN: 1. Left mastectomy with left sentinel lymph node biopsy vs axillary node dissection and Right total  mastectomy([id number], [id number], [id number], [id number]) 2. Immediate reconstruction by Dr. Oletha Blend    Patient and her husband verbalized understanding, all questions were answered, and were agreeable with the plan outlined above.   Herbert Pun, MD

## 2021-06-25 NOTE — Anesthesia Preprocedure Evaluation (Signed)
Anesthesia Evaluation   Patient awake    Reviewed: Allergy & Precautions, NPO status , Patient's Chart, lab work & pertinent test results  History of Anesthesia Complications Negative for: history of anesthetic complications  Airway Mallampati: I       Dental  (+) Dental Advidsory Given   Pulmonary shortness of breath and with exertion, asthma (no inhalers in months, exercise induced) , neg sleep apnea, neg COPD, neg recent URI, Not current smoker,           Cardiovascular (-) hypertension(-) angina(-) Past MI and (-) CHF (-) dysrhythmias (-) Valvular Problems/Murmurs     Neuro/Psych neg Seizures PSYCHIATRIC DISORDERS Anxiety Depression    GI/Hepatic Neg liver ROS, neg GERD  ,  Endo/Other  neg diabetes  Renal/GU negative Renal ROS     Musculoskeletal   Abdominal   Peds  Hematology   Anesthesia Other Findings Past Medical History: No date: Anxiety No date: Asthma 11/2020: Breast cancer ([place])     Comment:  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 breast ca No date: Depression No date: Family history of cancer No date: History of chemotherapy No date: Varicose veins of bilateral lower extremities with pain   Reproductive/Obstetrics                             Anesthesia Physical  Anesthesia Plan  ASA: 2  Anesthesia Plan: General   Post-op Pain Management:    Induction: Intravenous  PONV Risk Score and Plan: 3 and Ondansetron, Dexamethasone, Midazolam and Treatment may vary due to age or medical condition  Airway Management Planned: Oral ETT and LMA  Additional Equipment:   Intra-op Plan:   Post-operative Plan: Extubation in OR  Informed Consent: I have reviewed the patients History and Physical, chart, labs and discussed the procedure including the risks, benefits and alternatives for the proposed anesthesia with the patient or authorized representative who has indicated his/her understanding  and acceptance.       Plan Discussed with:   Anesthesia Plan Comments:         Anesthesia Quick Evaluation

## 2021-06-25 NOTE — H&P (Signed)
Vickie Mathis is an 30 y.o. female.   Chief Complaint: Breast Cancer HPI: The patient is a 30 year old female with left breast cancer.  She presents for treatment along with general surgery.  She has decided on bilateral mastectomies with immediate expander and Flex HD placement for her reconstruction.  She has been on neoadjuvant chemotherapy and will continue this after surgery.  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  She is 5 feet 9 inches tall and weighs 197 pounds.  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   Depression    Family history of cancer    History of chemotherapy    Varicose veins of bilateral lower extremities with pain     Past Surgical History:  Procedure Laterality Date   APPENDECTOMY  2018   BREAST BIOPSY Left 11/26/2020   vision 12:00 6cmfn [facility]   BREAST BIOPSY Left 11/26/2020   LN bx, hydro marker,  fragments of macrometastatic carcinoma   PORTACATH PLACEMENT Right 12/13/2020   Procedure: INSERTION PORT-A-CATH;  Surgeon: Herbert Pun, MD;  Location: [facility] ORS;  Service: General;  Laterality: Right;    Family History  Problem Relation Age of Onset   Diabetes Father    Varicose Veins Father    Diabetes Paternal Aunt    Uterine cancer Paternal Aunt        precancerous   Diabetes Paternal Grandmother    Uterine cancer Paternal Grandmother        precancerous   Thyroid disease Mother    Thyroid disease Maternal Aunt    Thyroid disease Maternal Grandmother    Cancer Other        stomach vs ovarian/cervical   Cancer Paternal Great-grandmother        unk   Social History:  reports that she has never smoked. She has never used smokeless tobacco. She reports that she does not currently use alcohol. She reports current drug use. Drug: Marijuana.  Allergies:  Allergies  Allergen Reactions   Carboplatin Shortness Of  Breath, Nausea And Vomiting and Other (See Comments)    Flushing- chest , face, neck , arm including hand   Amoxicillin     Other reaction(s): "too young to remember what they do to me"   Sulfa Antibiotics     Other reaction(s): "too young to remember what they do to me"    Medications Prior to Admission  Medication Sig Dispense Refill   busPIRone (BUSPAR) 15 MG tablet Take 15 mg by mouth 2 (two) times daily.     citalopram (CELEXA) 40 MG tablet Take 1 tablet (40 mg total) by mouth daily. (Patient taking differently: Take 40 mg by mouth at bedtime.) 90 tablet 2   clonazePAM (KLONOPIN) 0.5 MG tablet TAKE 1 TABLET BY MOUTH THREE TIMES A DAY AS NEEDED 60 tablet 0   Cyanocobalamin (B-12 PO) Take 1 tablet by mouth daily. (Patient not taking: Reported on 06/24/2021)     cyclobenzaprine (FLEXERIL) 5 MG tablet Take 1 tablet (5 mg total) by mouth 3 (three) times daily as needed for muscle spasms. (Patient not taking: No sig reported) 30 tablet 0   docusate sodium (COLACE) 100 MG capsule Take 100 mg by mouth daily as needed for mild constipation. (Patient not taking: Reported on 06/24/2021)     ibuprofen (ADVIL) 200 MG tablet Take 400 mg by mouth every  6 (six) hours as needed for moderate pain. (Patient not taking: Reported on 06/24/2021)     lidocaine-prilocaine (EMLA) cream Apply 1 application topically daily as needed (prior to port access). 30 g 3   LORazepam (ATIVAN) 0.5 MG tablet TAKE 1 TABLET (0.5 MG TOTAL) BY MOUTH EVERY 6 (SIX) HOURS AS NEEDED (NAUSEA OR VOMITING). (Patient not taking: No sig reported) 30 tablet 0   Multiple Vitamin (MULTIVITAMIN WITH MINERALS) TABS tablet Take 1 tablet by mouth daily. (Patient not taking: Reported on 06/24/2021)     ondansetron (ZOFRAN) 8 MG tablet Take 1 tablet (8 mg total) by mouth every 8 (eight) hours as needed for refractory nausea / vomiting. Start on day 3 after chemo. 60 tablet 1   triamcinolone ointment (KENALOG) 0.5 % Apply 1 application topically 2 (two)  times daily. (Patient not taking: Reported on 06/24/2021) 30 g 0   TURMERIC CURCUMIN PO Take 2 capsules by mouth daily. (Patient not taking: Reported on 9/40/7680)     folic acid (FOLVITE) 1 MG tablet TAKE 2 TABLETS BY MOUTH EVERY DAY (Patient not taking: Reported on 06/24/2021) 180 tablet 0   OLANZapine (ZYPREXA) 10 MG tablet TAKE 1 TABLET BY MOUTH EVERYDAY AT BEDTIME (Patient not taking: No sig reported) 90 tablet 1   ondansetron (ZOFRAN ODT) 4 MG disintegrating tablet Take 1 tablet (4 mg total) by mouth every 8 (eight) hours as needed for nausea or vomiting. (Patient not taking: No sig reported) 20 tablet 0   potassium chloride SA (KLOR-CON) 20 MEQ tablet Take 1 tablet (20 mEq total) by mouth daily. (Patient not taking: Reported on 06/24/2021) 3 tablet 0   prochlorperazine (COMPAZINE) 10 MG tablet Take 1 tablet (10 mg total) by mouth every 6 (six) hours as needed (Nausea or vomiting). 30 tablet 1    Results for orders placed or performed during the hospital encounter of 06/25/21 (from the past 48 hour(s))  Urine Drug Screen, Qualitative ([facility] only)     Status: Abnormal   Collection Time: 06/25/21  7:39 AM  Result Value Ref Range   Tricyclic, Ur Screen NONE DETECTED NONE DETECTED   Amphetamines, Ur Screen NONE DETECTED NONE DETECTED   MDMA (Ecstasy)Ur Screen NONE DETECTED NONE DETECTED   Cocaine Metabolite,Ur [facility] NONE DETECTED NONE DETECTED   Opiate, Ur Screen NONE DETECTED NONE DETECTED   Phencyclidine (PCP) Ur S NONE DETECTED NONE DETECTED   Cannabinoid 50 Ng, Ur [place] POSITIVE (A) NONE DETECTED   Barbiturates, Ur Screen NONE DETECTED NONE DETECTED   Benzodiazepine, Ur Scrn NONE DETECTED NONE DETECTED   Methadone Scn, Ur NONE DETECTED NONE DETECTED    Comment: (NOTE) Tricyclics + metabolites, urine    Cutoff 1000 ng/mL Amphetamines + metabolites, urine  Cutoff 1000 ng/mL MDMA (Ecstasy), urine              Cutoff 500 ng/mL Cocaine Metabolite, urine          Cutoff 300 ng/mL Opiate +  metabolites, urine        Cutoff 300 ng/mL Phencyclidine (PCP), urine         Cutoff 25 ng/mL Cannabinoid, urine                 Cutoff 50 ng/mL Barbiturates + metabolites, urine  Cutoff 200 ng/mL Benzodiazepine, urine              Cutoff 200 ng/mL Methadone, urine  Cutoff 300 ng/mL  The urine drug screen provides only a preliminary, unconfirmed analytical test result and should not be used for non-medical purposes. Clinical consideration and professional judgment should be applied to any positive drug screen result due to possible interfering substances. A more specific alternate chemical method must be used in order to obtain a confirmed analytical result. Gas chromatography / mass spectrometry (GC/MS) is the preferred confirm atory method. Performed at [facility], [address] [place],  [address]   Pregnancy, urine POC     Status: None   Collection Time: 06/25/21  8:03 AM  Result Value Ref Range   Preg Test, Ur NEGATIVE NEGATIVE    Comment:        THE SENSITIVITY OF THIS METHODOLOGY IS >24 mIU/mL    NM Sentinel Node Inj-No Rpt (Breast)  Result Date: 06/25/2021 Sulfur Colloid was injected by the Nuclear Medicine Technologist for sentinel lymph node localization.    Review of Systems  Constitutional: Negative.   HENT: Negative.    Eyes: Negative.   Respiratory: Negative.    Cardiovascular: Negative.   Gastrointestinal: Negative.   Endocrine: Negative.   Genitourinary: Negative.   Musculoskeletal: Negative.   Neurological: Negative.   Hematological: Negative.   Psychiatric/Behavioral: Negative.     Blood pressure (!) 105/54, pulse 84, temperature 97.7 F (36.5 C), temperature source Oral, resp. rate 17, height $RemoveBe'5\' 9"'UturxRzls$  (1.753 m), weight 89.8 kg, SpO2 98 %. Physical Exam Vitals and nursing note reviewed.  Constitutional:      Appearance: Normal appearance.  HENT:     Head: Normocephalic and atraumatic.  Cardiovascular:     Rate  and Rhythm: Normal rate.  Pulmonary:     Effort: Pulmonary effort is normal.  Abdominal:     General: Abdomen is flat. There is no distension.     Tenderness: There is no abdominal tenderness.  Musculoskeletal:        General: No swelling.  Skin:    General: Skin is warm.     Coloration: Skin is not jaundiced.     Findings: No bruising.  Neurological:     General: No focal deficit present.     Mental Status: She is alert and oriented to person, place, and time.  Psychiatric:        Mood and Affect: Mood normal.        Behavior: Behavior normal.        Thought Content: Thought content normal.     Assessment/Plan Left Breast cancer -plan for bilateral immediate breast reconstruction with expanders and Flex HD.  Consent was confirmed.   [place], DO 06/25/2021, 11:01 AM

## 2021-06-25 NOTE — Op Note (Signed)
Op report    DATE OF OPERATION:  06/25/2021  LOCATION: [facility]   SURGICAL DIVISION: Plastic Surgery  PREOPERATIVE DIAGNOSES:  1. Left Breast cancer.    POSTOPERATIVE DIAGNOSES:  1. Left Breast cancer.   PROCEDURE:  1. Bilateral immediate breast reconstruction with placement of Acellular Dermal Matrix and tissue expanders.  SURGEON: Cashtyn Pouliot Sanger Saed Hudlow, DO  ANESTHESIA:  General.   COMPLICATIONS: None.   IMPLANTS: Left - Mentor 535 cc. Ref #[id number].  200 cc of injectable saline placed in the expander. Right - Mentor 535 cc. Ref #[id number]. 200 cc of injectable saline placed in the expander. Acellular Dermal Matrix Flex HD 6 x 16 cm two  INDICATIONS FOR PROCEDURE:  The patient, Vickie Mathis, is a 30 y.o. female born on 12-17-1990, is here for  immediate first stage breast reconstruction with placement of bilateral tissue expander and Acellular dermal matrix. MRN: [id number]  CONSENT:  Informed consent was obtained directly from the patient. Risks, benefits and alternatives were fully discussed. Specific risks including but not limited to bleeding, infection, hematoma, seroma, scarring, pain, implant infection, implant extrusion, capsular contracture, asymmetry, wound healing problems, and need for further surgery were all discussed. The patient did have an ample opportunity to have her questions answered to her satisfaction.   DESCRIPTION OF PROCEDURE:  The patient was taken to the operating room by the general surgery team. SCDs were placed and IV antibiotics were given. The patient's chest was prepped and draped in a sterile fashion. A time out was performed and the implants to be used were identified.  Bilateral mastectomies were performed.  Once the general surgery team had completed their portion of the case the patient was rendered to the plastic and reconstructive surgery team.  Left:  The pectoralis major muscle was lifted from the chest wall with  release of the lateral edge and lateral inframammary fold.  The pocket was irrigated with antibiotic solution and hemostasis was achieved with electrocautery.  The ADM was then prepared according to the manufacture guidelines and slits placed to help with postoperative fluid management.  The ADM was then sutured to the inferior and lateral edge of the inframammary fold with 2-0 PDS starting with an interrupted stitch and then a running stitch.  The lateral portion was sutured to with interrupted sutures after the expander was placed.  The expander was prepared according to the manufacture guidelines, the air evacuated and then it was placed under the ADM and pectoralis major muscle.  The inferior and lateral tabs were used to secure the expander to the chest wall with 2-0 PDS.  The drain was placed at the inframammary fold over the ADM and secured to the skin with 3-0 Silk.  The deep layers were closed with 3-0 Monocryl followed by 4-0 Monocryl.  Dermabond was applied.    Right: The pectoralis major muscle was lifted from the chest wall with release of the lateral edge and lateral inframammary fold.  The pocket was irrigated with antibiotic solution and hemostasis was achieved with electrocautery.  The ADM was then prepared according to the manufacture guidelines and slits placed to help with postoperative fluid management.  The ADM was then sutured to the inferior and lateral edge of the inframammary fold with 2-0 PDS starting with an interrupted stitch and then a running stitch.  The lateral portion was sutured to with interrupted sutures after the expander was placed.  The expander was prepared according to the manufacture guidelines, the air evacuated and then it  was placed under the ADM and pectoralis major muscle.  The inferior and lateral tabs were used to secure the expander to the chest wall with 2-0 PDS.  The drain was placed at the inframammary fold over the ADM and secured to the skin with 3-0 Silk.   The deep layers were closed with 3-0 Monocryl followed by 4-0 Monocryl.  Dermabond was applied.  The ABDs and breast binder were placed.  The patient tolerated the procedure well and there were no complications.  The patient was allowed to wake from anesthesia and taken to the recovery room in satisfactory condition.

## 2021-06-25 NOTE — Transfer of Care (Signed)
Immediate Anesthesia Transfer of Care Note  Patient: Vickie Mathis  Procedure(s) Performed: BILATERAL TOTAL MASTECTOMY WITH LEFT AXILLARY LYMPH NODE BIOPSY VS. AXILLARY NODE DISSECTION (Bilateral) BREAST RECONSTRUCTION WITH PLACEMENT OF TISSUE EXPANDER AND FLEX HD (ACELLULAR HYDRATED DERMIS) (Bilateral)  Patient Location: PACU  Anesthesia Type:General  Level of Consciousness: drowsy  Airway & Oxygen Therapy: Patient Spontanous Breathing and Patient connected to face mask oxygen  Post-op Assessment: Report given to RN and Post -op Vital signs reviewed and stable  Post vital signs: Reviewed and stable  Last Vitals:  Vitals Value Taken Time  BP 111/66 06/25/21 1430  Temp    Pulse 101 06/25/21 1435  Resp 18 06/25/21 1435  SpO2 99 % 06/25/21 1435  Vitals shown include unvalidated device data.  Last Pain:  Vitals:   06/25/21 0835  TempSrc: Oral  PainSc: 0-No pain         Complications: No notable events documented.

## 2021-06-25 NOTE — Op Note (Signed)
Preoperative diagnosis: Carcinoma of the left breast.  Postoperative diagnosis: Same.   Procedure: Bilateral total mastectomy.                     Left axillary Sentinel Lymph node biopsy  Anesthesia: GETA  Surgeon: Dr. Windell Moment  Wound Classification: Clean  Indications: Patient is a 30 y.o. female who had an abnormal mammogram that on workup with core needle biopsy was found to be carcinoma of the left breast. After completion of neoadjuvant chemotherapy, discussion of alternatives, the patient elected bilateral total mastectomy and left axillary sentinel lymph nodes biopsy.  Findings: 1.  No gross metastatic disease identified. 2.  Frozen section of 2 sentinel nodes including the preoperative lymph node were negative for malignancy.  Description of procedure: The patient was brought to the operating room and general anesthesia was induced. A time-out was completed verifying correct patient, procedure, site, positioning, and implant(s) and/or special equipment prior to beginning this procedure. The breasts, chest wall, bilateral axilla, and both upper arm and neck were prepped and draped in the usual sterile fashion.  I personally injected methylene blue on the left periareolar area for intraoperative evaluation of sentinel lymph nodes of the left axilla. A skin incision was made that encompassed the nipple-areola complex and the previous biopsy scar and passed in an oblique direction across the breast. Flaps were raised in the avascular plane between subcutaneous tissue and breast tissue from the clavicle superiorly, the sternum medially, the anterior rectus sheath inferiorly, and past the lateral border of the pectoralis major muscle laterally. Hemostasis was achieved in the flaps. Next, the breast tissue and underlying pectoralis fascia were excised from the pectoralis major muscle, progressing from medially to laterally. At the lateral border of the pectoralis major muscle, the breast  tissue was swung laterally and a lateral pedicle identified where breast tissue gave way to fat of axilla. The lateral pedicle was incised and the specimen removed.   A hand-held gamma probe was used to identify the location of the hottest spot in the axilla. Dissection was carried down until subdermal facias was advanced. The probe was placed and again, the point of maximal count was found.  This was consistent with the area of the RF tag.  RF tag identified with local artery device.  Dissection continue until nodule was identified. The probe was placed in contact with the node. The node was excised in its entirety.  This node was next to the RF tag, hot and blue nodule.  Three additional hot and blue nodes were detected and the node was excised in similar fashion.  The first 2 sentinel lymph nodes were sent to pathology for frozen section and call back reporting that the were negative for malignancy. No clinically abnormal nodes were palpated.   The right breast mastectomy was done using the same technique as the left breast.  No axillary lymph nodes biopsy was done on the right axilla.  Wounds were left open for immediate reconstruction by plastic and reconstructive surgery.   Sentinel Node Biopsy Synoptic Operative Report  Operation performed with curative intent:Yes  Tracer(s) used to identify sentinel nodes in the upfront surgery (non-neoadjuvant) setting (select all that apply):Dye and Radioactive Tracer  Tracer(s) used to identify sentinel nodes in the neoadjuvant setting (select all that apply):N/A  All nodes (colored or non-colored) present at the end of a dye-filled lymphatic channel were removed:Yes   All significantly radioactive nodes were removed:Yes  All palpable suspicious nodes were  removed:Yes  Biopsy-proven positive nodes marked with clips prior to chemotherapy were identified and removed:Yes  Specimen: Right Breast                      Left Breast                     Sentinel lymph nodes #1,2,3,4.   Complications: None  Estimated Blood Loss: 50 mL

## 2021-06-25 NOTE — Anesthesia Procedure Notes (Signed)
Procedure Name: Intubation Date/Time: 06/25/2021 10:01 AM Performed by: Guadlupe Spanish, RN Pre-anesthesia Checklist: Patient identified, Emergency Drugs available, Suction available and Patient being monitored Patient Re-evaluated:Patient Re-evaluated prior to induction Oxygen Delivery Method: Circle system utilized Preoxygenation: Pre-oxygenation with 100% oxygen Induction Type: IV induction Ventilation: Mask ventilation without difficulty Laryngoscope Size: McGraph and 3 Grade View: Grade I Tube type: Oral Tube size: 7.0 mm Number of attempts: 1 Airway Equipment and Method: Stylet and Video-laryngoscopy Placement Confirmation: ETT inserted through vocal cords under direct vision, positive ETCO2 and breath sounds checked- equal and bilateral Secured at: 22 cm Tube secured with: Tape Dental Injury: Teeth and Oropharynx as per pre-operative assessment

## 2021-06-26 ENCOUNTER — Observation Stay: Payer: 59

## 2021-06-26 ENCOUNTER — Encounter: Payer: Self-pay | Admitting: General Surgery

## 2021-06-26 DIAGNOSIS — C50912 Malignant neoplasm of unspecified site of left female breast: Secondary | ICD-10-CM | POA: Diagnosis not present

## 2021-06-26 LAB — CBC
HCT: 26.8 % — ABNORMAL LOW (ref 36.0–46.0)
Hemoglobin: 9.2 g/dL — ABNORMAL LOW (ref 12.0–15.0)
MCH: 34.3 pg — ABNORMAL HIGH (ref 26.0–34.0)
MCHC: 34.3 g/dL (ref 30.0–36.0)
MCV: 100 fL (ref 80.0–100.0)
Platelets: 195 10*3/uL (ref 150–400)
RBC: 2.68 MIL/uL — ABNORMAL LOW (ref 3.87–5.11)
RDW: 14.7 % (ref 11.5–15.5)
WBC: 15.4 10*3/uL — ABNORMAL HIGH (ref 4.0–10.5)
nRBC: 0 % (ref 0.0–0.2)

## 2021-06-26 LAB — BASIC METABOLIC PANEL
Anion gap: 9 (ref 5–15)
BUN: 7 mg/dL (ref 6–20)
CO2: 24 mmol/L (ref 22–32)
Calcium: 9 mg/dL (ref 8.9–10.3)
Chloride: 104 mmol/L (ref 98–111)
Creatinine, Ser: 0.58 mg/dL (ref 0.44–1.00)
GFR, Estimated: >60 mL/min (ref >60)
Glucose, Bld: 100 mg/dL — ABNORMAL HIGH (ref 70–99)
Potassium: 3.7 mmol/L (ref 3.5–5.1)
Sodium: 137 mmol/L (ref 135–145)

## 2021-06-26 IMAGING — DX DG CHEST 1V PORT
1 series · 1 of 1 positions shown · non-contrast
Comparison: [DATE].

CLINICAL DATA: Chest pain.

EXAM:
PORTABLE CHEST 1 VIEW

[chest ap]
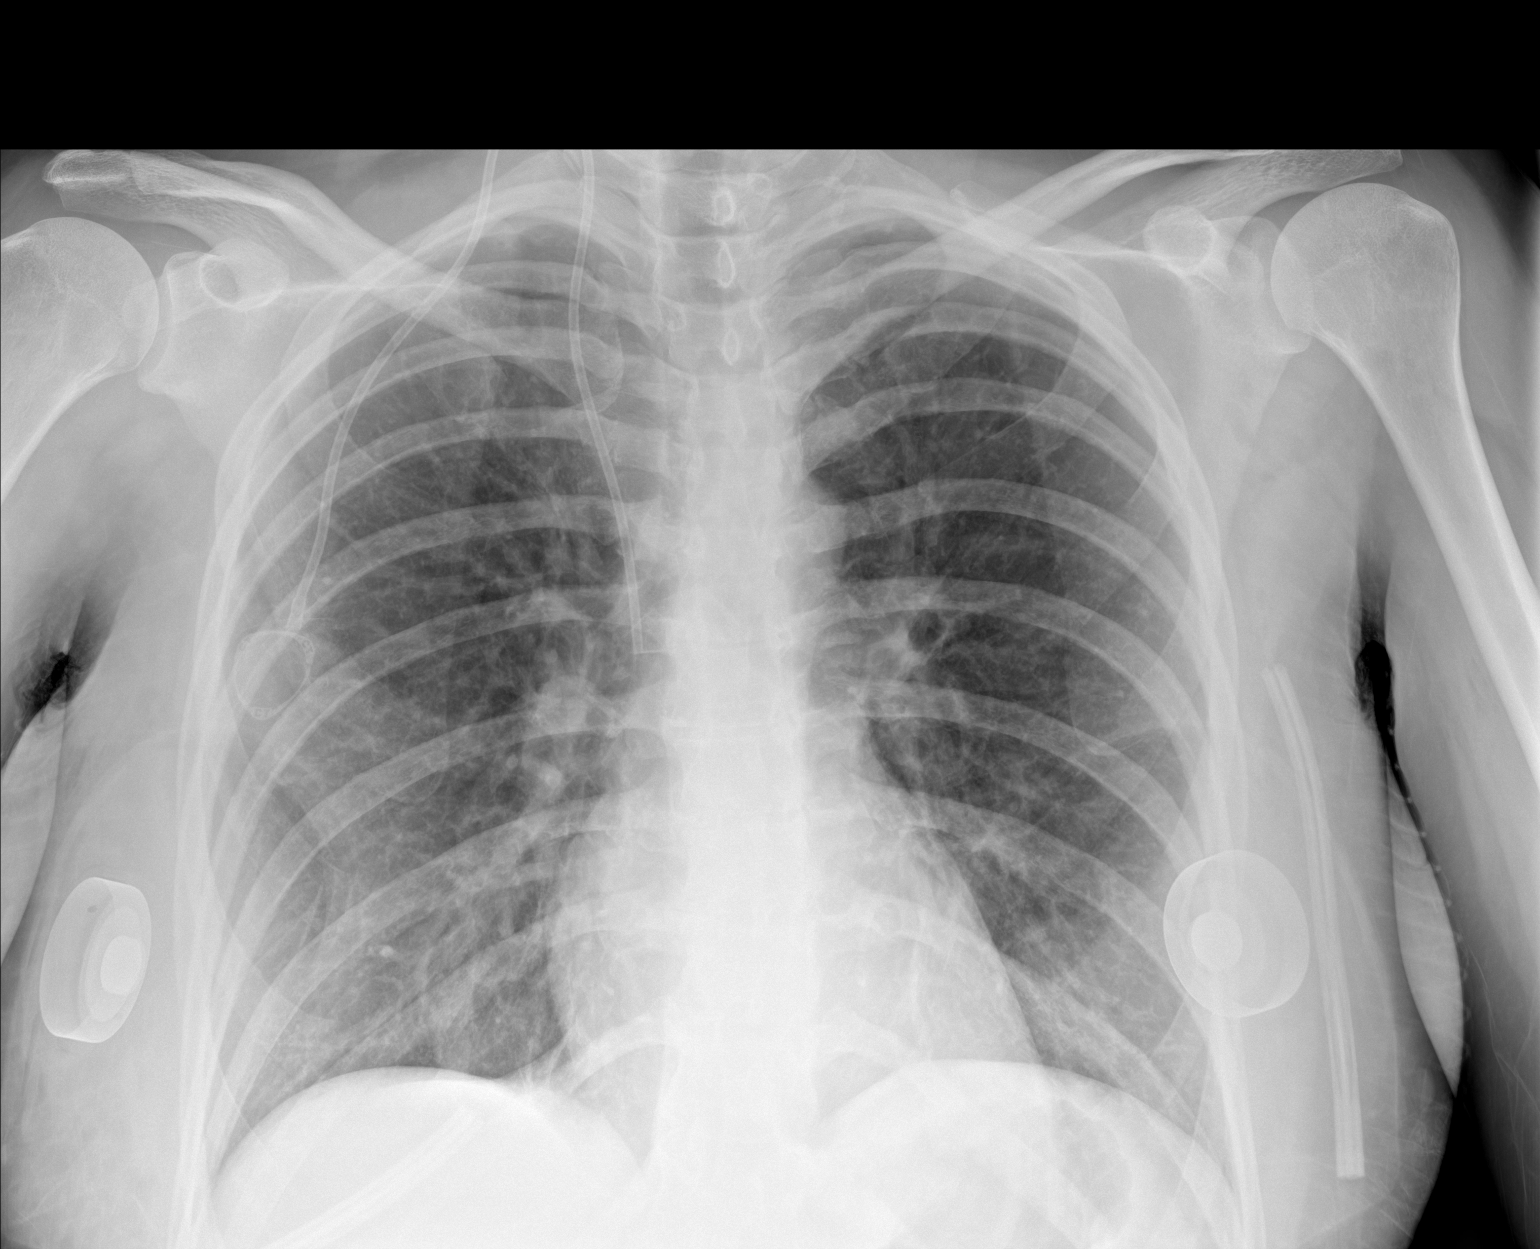

[1 of 1 positions shown; findings below may reference images not displayed]

FINDINGS: The heart size and mediastinal contours are within normal limits.
Both lungs are clear. Right internal jugular Port-A-Cath is
unchanged in position. The visualized skeletal structures are
unremarkable.
IMPRESSION: No active disease.

## 2021-06-26 MED ORDER — METHOCARBAMOL 500 MG PO TABS: 500.0000 mg | ORAL_TABLET | Freq: Three times a day (TID) | ORAL | 0 refills | Status: AC

## 2021-06-26 MED ORDER — GABAPENTIN 300 MG PO CAPS: 300.0000 mg | ORAL_CAPSULE | Freq: Three times a day (TID) | ORAL | 0 refills | Status: DC

## 2021-06-26 NOTE — Anesthesia Postprocedure Evaluation (Signed)
Anesthesia Post Note  Patient: Vickie Mathis  Procedure(s) Performed: BILATERAL TOTAL MASTECTOMY WITH LEFT AXILLARY LYMPH NODE BIOPSY VS. AXILLARY NODE DISSECTION (Bilateral) BREAST RECONSTRUCTION WITH PLACEMENT OF TISSUE EXPANDER AND FLEX HD (ACELLULAR HYDRATED DERMIS) (Bilateral)  Patient location during evaluation: PACU Anesthesia Type: General Level of consciousness: awake and alert Pain management: pain level controlled Vital Signs Assessment: post-procedure vital signs reviewed and stable Respiratory status: spontaneous breathing, nonlabored ventilation, respiratory function stable and patient connected to nasal cannula oxygen Cardiovascular status: blood pressure returned to baseline and stable Postop Assessment: no apparent nausea or vomiting Anesthetic complications: no   No notable events documented.   Last Vitals:  Vitals:   06/26/21 1125 06/26/21 1443  BP: 120/76 (!) 141/98  Pulse: 75 (!) 102  Resp:  16  Temp: 37.1 C 37.2 C  SpO2: 100% 100%    Last Pain:  Vitals:   06/26/21 1443  TempSrc: Oral  PainSc:                  Martha Clan

## 2021-06-26 NOTE — Discharge Summary (Signed)
Patient ID: Vickie Mathis MRN: [id number] DOB/AGE: Jan 16, 1991 30 y.o.  Admit date: 06/25/2021 Discharge date: 06/26/2021   Discharge Diagnoses:  Active Problems:   Breast cancer [facility])   Procedures: Bilateral total mastectomy with left axillary sentinel node biopsy and immediate reconstruction  Hospital Course: Patient admitted for surgical management of breast cancer.  She underwent bilateral total mastectomy with left axillary sentinel node biopsy and immediate reconstruction.  She has been tolerated the surgery well.  Pain mostly on the right side.  Chest x-ray shows no sign of complications.  Slowly improving pain with aggressive pain medications.  Patient able to ambulate.  Drain with adequate output.  Physical exam with chest follow-up at without any sign of ischemia.  Physical Exam HENT:     Head: Normocephalic.  Cardiovascular:     Rate and Rhythm: Normal rate and regular rhythm.  Pulmonary:     Effort: Pulmonary effort is normal.  Abdominal:     General: Abdomen is flat.  Musculoskeletal:     Cervical back: Normal range of motion.  Skin:    General: Skin is warm.  Neurological:     General: No focal deficit present.     Mental Status: She is alert and oriented to person, place, and time.     Consults: None  Disposition: Discharge disposition: 01-Home or Self Care       Discharge Instructions     Diet - low sodium heart healthy   Complete by: As directed    Increase activity slowly   Complete by: As directed       Allergies as of 06/26/2021       Reactions   Carboplatin Shortness Of Breath, Nausea And Vomiting, Other (See Comments)   Flushing- chest , face, neck , arm including hand   Amoxicillin    Other reaction(s): "too young to remember what they do to me"   Sulfa Antibiotics    Other reaction(s): "too young to remember what they do to me"        Medication List     TAKE these medications    B-12 PO Take 1 tablet by mouth daily.    busPIRone 15 MG tablet Commonly known as: BUSPAR Take 15 mg by mouth 2 (two) times daily.   citalopram 40 MG tablet Commonly known as: CELEXA Take 1 tablet (40 mg total) by mouth daily. What changed: when to take this   clonazePAM 0.5 MG tablet Commonly known as: KLONOPIN TAKE 1 TABLET BY MOUTH THREE TIMES A DAY AS NEEDED   cyclobenzaprine 5 MG tablet Commonly known as: FLEXERIL Take 1 tablet (5 mg total) by mouth 3 (three) times daily as needed for muscle spasms.   docusate sodium 100 MG capsule Commonly known as: COLACE Take 100 mg by mouth daily as needed for mild constipation.   folic acid 1 MG tablet Commonly known as: FOLVITE TAKE 2 TABLETS BY MOUTH EVERY DAY   gabapentin 300 MG capsule Commonly known as: Neurontin Take 1 capsule (300 mg total) by mouth 3 (three) times daily for 10 days.   ibuprofen 200 MG tablet Commonly known as: ADVIL Take 400 mg by mouth every 6 (six) hours as needed for moderate pain.   lidocaine-prilocaine cream Commonly known as: EMLA Apply 1 application topically daily as needed (prior to port access).   LORazepam 0.5 MG tablet Commonly known as: ATIVAN TAKE 1 TABLET (0.5 MG TOTAL) BY MOUTH EVERY 6 (SIX) HOURS AS NEEDED (NAUSEA OR VOMITING).   methocarbamol 500  MG tablet Commonly known as: Robaxin Take 1 tablet (500 mg total) by mouth 3 (three) times daily for 10 days.   multivitamin with minerals Tabs tablet Take 1 tablet by mouth daily.   OLANZapine 10 MG tablet Commonly known as: ZYPREXA TAKE 1 TABLET BY MOUTH EVERYDAY AT BEDTIME   ondansetron 4 MG disintegrating tablet Commonly known as: Zofran ODT Take 1 tablet (4 mg total) by mouth every 8 (eight) hours as needed for nausea or vomiting.   ondansetron 8 MG tablet Commonly known as: Zofran Take 1 tablet (8 mg total) by mouth every 8 (eight) hours as needed for refractory nausea / vomiting. Start on day 3 after chemo.   potassium chloride SA 20 MEQ tablet Commonly known  as: KLOR-CON Take 1 tablet (20 mEq total) by mouth daily.   prochlorperazine 10 MG tablet Commonly known as: COMPAZINE Take 1 tablet (10 mg total) by mouth every 6 (six) hours as needed (Nausea or vomiting).   triamcinolone ointment 0.5 % Commonly known as: KENALOG Apply 1 application topically 2 (two) times daily.   TURMERIC CURCUMIN PO Take 2 capsules by mouth daily.        Follow-up Information     Herbert Pun, MD Follow up in 2 week(s).   Specialty: General Surgery Contact information: [address] [place] [address] [phone]

## 2021-06-27 ENCOUNTER — Encounter: Payer: 59 | Admitting: Physician Assistant

## 2021-06-27 ENCOUNTER — Telehealth: Payer: Self-pay | Admitting: *Deleted

## 2021-06-27 NOTE — Telephone Encounter (Signed)
Received on (06/24/21) via of fax DME Standard Written Order Waynetta Sandy) from [facility] [place].  Requesting signature and return.  Given to provider to sign.    Orders signed and faxed to [facility] [place].  Confirmation received, and copy scanned into the chart.//AB/CMA

## 2021-06-30 NOTE — Progress Notes (Signed)
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  I reviewed patient's operative note and 200 cc was injected into each of the 535 cc [place] expanders.  Today, she states that she is continuing to take her narcotics, as prescribed.  She had already finished her course of narcotic analgesics prescribed by [place] and has since received additional pain control by her general surgeon, Dr. Windell Moment.  She also has been taking Robaxin to help with her muscle spasms.  She is accompanied by her husband at bedside.  She states that she sustained bruising from the chest binder and has since discontinued and transition into compressive sports bra provided by [facility].  She is very pleased with current compression bra and states that it is much more comfortable.  She denies any fevers or chills, redness, or asymmetry.  She just feels "tight".  She also endorses a new nodule in the right axillary region.  She reports that she is draining approximately 70 cc daily from each JP drain.  They state that the volume of drainage is decreasing with each passing day.  On physical exam, she does have a firm nodule appreciated in right axillary region, suspect local lymphadenopathy.  No overlying skin changes.  As for her breast, there is mild subcutaneous fluid appreciated bilaterally at lateral aspects.  No significant fluid wave however and after discussion with patient would prefer to abstain from aspiration and instead continue with compressive sports bra at this time.  There are no overlying skin changes concerning for cellulitis.  Her honeycomb dressing is well secured.  She did feel as though her JP drains are being "pulled" at times.  No Biopatch in place.  Help secure JP drains in place with  Medipore tape in addition to her nylon sutures which remain intact.  Her JP drains are still functioning well, charged here on exam.  We decided to hold off on expander fill today given that she is relatively tight on exam with mild subcutaneous fluid appreciated laterally.  Her exam is otherwise reassuring.  Plan for initial expander fill and likely JP drain removal (if continues to become increasingly scant) at next follow-up visit in 10-14 days.  She knows to call the clinic should she develop any new questions or concerns.

## 2021-07-01 LAB — SURGICAL PATHOLOGY

## 2021-07-04 ENCOUNTER — Other Ambulatory Visit: Payer: Self-pay | Admitting: Oncology

## 2021-07-04 ENCOUNTER — Ambulatory Visit (INDEPENDENT_AMBULATORY_CARE_PROVIDER_SITE_OTHER): Payer: 59 | Admitting: Physician Assistant

## 2021-07-04 ENCOUNTER — Other Ambulatory Visit: Payer: Self-pay

## 2021-07-04 DIAGNOSIS — Z9889 Other specified postprocedural states: Secondary | ICD-10-CM

## 2021-07-08 ENCOUNTER — Other Ambulatory Visit: Payer: Self-pay | Admitting: Oncology

## 2021-07-09 ENCOUNTER — Other Ambulatory Visit: Payer: Self-pay

## 2021-07-09 ENCOUNTER — Other Ambulatory Visit: Payer: 59

## 2021-07-09 DIAGNOSIS — R197 Diarrhea, unspecified: Secondary | ICD-10-CM

## 2021-07-10 ENCOUNTER — Other Ambulatory Visit: Payer: Self-pay

## 2021-07-10 ENCOUNTER — Inpatient Hospital Stay: Payer: 59

## 2021-07-10 ENCOUNTER — Encounter: Payer: Self-pay | Admitting: Hospice and Palliative Medicine

## 2021-07-10 ENCOUNTER — Telehealth: Payer: Self-pay

## 2021-07-10 ENCOUNTER — Inpatient Hospital Stay: Payer: 59 | Attending: Hospice and Palliative Medicine | Admitting: Hospice and Palliative Medicine

## 2021-07-10 VITALS — BP 114/80 | HR 106 | Temp 99.2°F | Resp 18

## 2021-07-10 DIAGNOSIS — Z95828 Presence of other vascular implants and grafts: Secondary | ICD-10-CM

## 2021-07-10 DIAGNOSIS — R197 Diarrhea, unspecified: Secondary | ICD-10-CM | POA: Diagnosis present

## 2021-07-10 DIAGNOSIS — Z9013 Acquired absence of bilateral breasts and nipples: Secondary | ICD-10-CM | POA: Diagnosis not present

## 2021-07-10 DIAGNOSIS — Z809 Family history of malignant neoplasm, unspecified: Secondary | ICD-10-CM | POA: Diagnosis not present

## 2021-07-10 DIAGNOSIS — R0789 Other chest pain: Secondary | ICD-10-CM | POA: Diagnosis not present

## 2021-07-10 DIAGNOSIS — Z171 Estrogen receptor negative status [ER-]: Secondary | ICD-10-CM | POA: Insufficient documentation

## 2021-07-10 DIAGNOSIS — B9689 Other specified bacterial agents as the cause of diseases classified elsewhere: Secondary | ICD-10-CM | POA: Insufficient documentation

## 2021-07-10 DIAGNOSIS — C50412 Malignant neoplasm of upper-outer quadrant of left female breast: Secondary | ICD-10-CM

## 2021-07-10 DIAGNOSIS — Z8049 Family history of malignant neoplasm of other genital organs: Secondary | ICD-10-CM | POA: Diagnosis not present

## 2021-07-10 DIAGNOSIS — C773 Secondary and unspecified malignant neoplasm of axilla and upper limb lymph nodes: Secondary | ICD-10-CM | POA: Diagnosis not present

## 2021-07-10 DIAGNOSIS — C50312 Malignant neoplasm of lower-inner quadrant of left female breast: Secondary | ICD-10-CM | POA: Diagnosis present

## 2021-07-10 LAB — GASTROINTESTINAL PANEL BY PCR, STOOL (REPLACES STOOL CULTURE)

## 2021-07-10 LAB — COMPREHENSIVE METABOLIC PANEL
ALT: 18 U/L (ref 0–44)
AST: 22 U/L (ref 15–41)
Albumin: 3.3 g/dL — ABNORMAL LOW (ref 3.5–5.0)
Alkaline Phosphatase: 84 U/L (ref 38–126)
Anion gap: 8 (ref 5–15)
BUN: 7 mg/dL (ref 6–20)
CO2: 23 mmol/L (ref 22–32)
Calcium: 9 mg/dL (ref 8.9–10.3)
Chloride: 107 mmol/L (ref 98–111)
Creatinine, Ser: 0.7 mg/dL (ref 0.44–1.00)
GFR, Estimated: >60 mL/min (ref >60)
Glucose, Bld: 89 mg/dL (ref 70–99)
Potassium: 3.7 mmol/L (ref 3.5–5.1)
Sodium: 138 mmol/L (ref 135–145)
Total Bilirubin: 0.4 mg/dL (ref 0.3–1.2)
Total Protein: 6.1 g/dL — ABNORMAL LOW (ref 6.5–8.1)

## 2021-07-10 LAB — CBC WITH DIFFERENTIAL/PLATELET
Abs Immature Granulocytes: 0.08 10*3/uL — ABNORMAL HIGH (ref 0.00–0.07)
Basophils Absolute: 0.1 10*3/uL (ref 0.0–0.1)
Basophils Relative: 1 %
Eosinophils Absolute: 3 10*3/uL — ABNORMAL HIGH (ref 0.0–0.5)
Eosinophils Relative: 26 %
HCT: 29.4 % — ABNORMAL LOW (ref 36.0–46.0)
Hemoglobin: 9.6 g/dL — ABNORMAL LOW (ref 12.0–15.0)
Immature Granulocytes: 1 %
Lymphocytes Relative: 12 %
Lymphs Abs: 1.3 10*3/uL (ref 0.7–4.0)
MCH: 32.3 pg (ref 26.0–34.0)
MCHC: 32.7 g/dL (ref 30.0–36.0)
MCV: 99 fL (ref 80.0–100.0)
Monocytes Absolute: 0.7 10*3/uL (ref 0.1–1.0)
Monocytes Relative: 6 %
Neutro Abs: 6.2 10*3/uL (ref 1.7–7.7)
Neutrophils Relative %: 54 %
Platelets: 350 10*3/uL (ref 150–400)
RBC: 2.97 MIL/uL — ABNORMAL LOW (ref 3.87–5.11)
RDW: 14.4 % (ref 11.5–15.5)
WBC: 11.4 10*3/uL — ABNORMAL HIGH (ref 4.0–10.5)
nRBC: 0 % (ref 0.0–0.2)

## 2021-07-10 MED ORDER — CLONAZEPAM 0.5 MG PO TABS: 0.5000 mg | ORAL_TABLET | Freq: Three times a day (TID) | ORAL | 0 refills | Status: DC | PRN

## 2021-07-10 MED ORDER — SODIUM CHLORIDE 0.9 % IV SOLN
INTRAVENOUS | Status: DC
  Filled 2021-07-10: qty 250

## 2021-07-10 MED ORDER — SODIUM CHLORIDE 0.9% FLUSH
10.0000 mL | Freq: Once | INTRAVENOUS | Status: DC
  Filled 2021-07-10: qty 10

## 2021-07-10 MED ORDER — HEPARIN SOD (PORK) LOCK FLUSH 100 UNIT/ML IV SOLN
500.0000 [IU] | Freq: Once | INTRAVENOUS | Status: AC
  Administered 2021-07-10: 500 [IU] via INTRAVENOUS
  Filled 2021-07-10: qty 5

## 2021-07-10 MED ORDER — OXYCODONE HCL 5 MG PO TABS
5.0000 mg | ORAL_TABLET | ORAL | 0 refills | Status: DC | PRN
Start: 2021-07-10 — End: 2021-07-16

## 2021-07-10 NOTE — Progress Notes (Signed)
[facility] [place]  [phone] [phone]  Patient Care Team: Vickie Pitch, MD as PCP - General (Family Medicine) Vickie Guadeloupe, MD as Consulting Physician (Hematology and Oncology)   Name of the patient: Vickie Mathis  [id number]  June 16, 1991   Date of visit: 07/10/21  Reason for Consult: Ms. Vickie Mathis is a 30 year old female with multiple medical problems including stage IIIb 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 breast cancer on systemic treatment with [facility] Keytruda chemotherapy.  Patient was diagnosed with C. difficile diarrhea on 06/06/2021 and started on 10 days of oral vancomycin, which normalized her stools.  She saw Dr. Janese Mathis 06/24/2021 at which time she appeared to be doing better.  Patient underwent bilateral mastectomies and reconstruction on 06/26/2021.  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  Patient reports recurrent diarrhea for about a week starting around the time when she came off her postop antibiotics.  She describes diarrhea as being watery and occurring a couple of times a day.  No abdominal pain.  She has occasional nausea but no vomiting.  Appetite is unchanged.  No fever or chills.  No urinary symptoms.  Denies any neurologic complaints. Denies recent fevers or illnesses. Denies any easy bleeding or bruising. Denies urinary complaints. Patient offers no further specific complaints today.  PAST MEDICAL HISTORY: Past Medical History:  Diagnosis Date   Anxiety    Asthma    Breast cancer ([place]) 11/2020   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 breast ca   Depression    Family history of cancer    History of chemotherapy    Varicose veins of bilateral lower extremities with pain     PAST SURGICAL HISTORY:  Past Surgical History:  Procedure Laterality Date   APPENDECTOMY  2018   BILATERAL TOTAL MASTECTOMY WITH AXILLARY LYMPH NODE DISSECTION Bilateral  06/25/2021   Procedure: BILATERAL TOTAL MASTECTOMY WITH LEFT AXILLARY LYMPH NODE BIOPSY VS. AXILLARY NODE DISSECTION;  Surgeon: Herbert Pun, MD;  Location: [facility] ORS;  Service: General;  Laterality: Bilateral;  [place], 1.5 hours Cintron-Diaz 2.5 hours   BREAST BIOPSY Left 11/26/2020   vision 12:00 6cmfn [facility]   BREAST BIOPSY Left 11/26/2020   LN bx, hydro marker,  fragments of macrometastatic carcinoma   BREAST RECONSTRUCTION WITH PLACEMENT OF TISSUE EXPANDER AND FLEX HD (ACELLULAR HYDRATED DERMIS) Bilateral 06/25/2021   Procedure: BREAST RECONSTRUCTION WITH PLACEMENT OF TISSUE EXPANDER AND FLEX HD (ACELLULAR HYDRATED DERMIS);  Surgeon: Wallace Going, DO;  Location: [facility] ORS;  Service: Plastics;  Laterality: Bilateral;   PORTACATH PLACEMENT Right 12/13/2020   Procedure: INSERTION PORT-A-CATH;  Surgeon: Herbert Pun, MD;  Location: [facility] ORS;  Service: General;  Laterality: Right;    HEMATOLOGY/ONCOLOGY HISTORY:  Oncology History  Invasive carcinoma of breast ([place])  12/03/2020 Initial Diagnosis   Invasive carcinoma of breast ([place])   12/17/2020 -  Chemotherapy    Patient is on Treatment Plan: BREAST PEMBROLIZUMAB + CARBOPLATIN D1,8,15+ PACLITAXEL D1,8,15 Q21D X 4 CYCLES / PEMBROLIZUMAB + AC Q21D X 4 CYCLES      12/17/2020 Cancer Staging   Staging form: Breast, AJCC 8th Edition - Clinical stage from 12/17/2020: Stage IIIB (cT2, cN1, cM0, G3, ER-, PR-, HER2-) - Signed by Vickie Guadeloupe, MD on 12/19/2020 Histologic grading system: 3 grade system    Genetic Testing   Negative genetic testing. No pathogenic variants identified on the [facility] CancerNext-Expanded+RNA Panel. 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 The report date is  01/14/2021.  The CancerNext-Expanded + RNAinsight gene panel offered by [employer] and includes sequencing and rearrangement analysis for the following 77 genes: IP, ALK, APC*, ATM*, AXIN2, BAP1, BARD1, BLM, BMPR1A, BRCA1*, BRCA2*, BRIP1*, CDC73,  CDH1*,CDK4, CDKN1B, CDKN2A, CHEK2*, CTNNA1, DICER1, FANCC, FH, FLCN, GALNT12, KIF1B, LZTR1, MAX, MEN1, MET, MLH1*, MSH2*, MSH3, MSH6*, MUTYH*, NBN, NF1*, NF2, NTHL1, PALB2*, PHOX2B, PMS2*, POT1, PRKAR1A, PTCH1, PTEN*, RAD51C*, RAD51D*,RB1, RECQL, RET, SDHA, SDHAF2, SDHB, SDHC, SDHD, SMAD4, SMARCA4, SMARCB1, SMARCE1, STK11, SUFU, TMEM127, TP53*,TSC1, TSC2, VHL and XRCC2 (sequencing and deletion/duplication); EGFR, EGLN1, HOXB13, KIT, MITF, PDGFRA, POLD1 and POLE (sequencing only); EPCAM and GREM1 (deletion/duplication only).     ALLERGIES:  is allergic to carboplatin, amoxicillin, and sulfa antibiotics.  MEDICATIONS:  Current Outpatient Medications  Medication Sig Dispense Refill   busPIRone (BUSPAR) 15 MG tablet Take 15 mg by mouth 2 (two) times daily.     citalopram (CELEXA) 40 MG tablet Take 1 tablet (40 mg total) by mouth daily. (Patient taking differently: Take 40 mg by mouth at bedtime.) 90 tablet 2   clonazePAM (KLONOPIN) 0.5 MG tablet TAKE 1 TABLET BY MOUTH THREE TIMES A DAY AS NEEDED 60 tablet 0   Cyanocobalamin (B-12 PO) Take 1 tablet by mouth daily. (Patient not taking: No sig reported)     cyclobenzaprine (FLEXERIL) 5 MG tablet Take 1 tablet (5 mg total) by mouth 3 (three) times daily as needed for muscle spasms. (Patient not taking: No sig reported) 30 tablet 0   docusate sodium (COLACE) 100 MG capsule Take 100 mg by mouth daily as needed for mild constipation. (Patient not taking: No sig reported)     folic acid (FOLVITE) 1 MG tablet TAKE 2 TABLETS BY MOUTH EVERY DAY (Patient not taking: No sig reported) 180 tablet 0   gabapentin (NEURONTIN) 300 MG capsule Take 1 capsule (300 mg total) by mouth 3 (three) times daily for 10 days. 30 capsule 0   ibuprofen (ADVIL) 200 MG tablet Take 400 mg by mouth every 6 (six) hours as needed for moderate pain. (Patient not taking: No sig reported)     lidocaine-prilocaine (EMLA) cream Apply 1 application topically daily as needed (prior to port  access). 30 g 3   Multiple Vitamin (MULTIVITAMIN WITH MINERALS) TABS tablet Take 1 tablet by mouth daily. (Patient not taking: Reported on 06/24/2021)     OLANZapine (ZYPREXA) 10 MG tablet TAKE 1 TABLET BY MOUTH EVERYDAY AT BEDTIME (Patient not taking: No sig reported) 90 tablet 1   ondansetron (ZOFRAN ODT) 4 MG disintegrating tablet Take 1 tablet (4 mg total) by mouth every 8 (eight) hours as needed for nausea or vomiting. (Patient not taking: No sig reported) 20 tablet 0   ondansetron (ZOFRAN) 8 MG tablet Take 1 tablet (8 mg total) by mouth every 8 (eight) hours as needed for refractory nausea / vomiting. Start on day 3 after chemo. 60 tablet 1   potassium chloride SA (KLOR-CON) 20 MEQ tablet Take 1 tablet (20 mEq total) by mouth daily. (Patient not taking: Reported on 06/24/2021) 3 tablet 0   prochlorperazine (COMPAZINE) 10 MG tablet Take 1 tablet (10 mg total) by mouth every 6 (six) hours as needed (Nausea or vomiting). 30 tablet 1   triamcinolone ointment (KENALOG) 0.5 % Apply 1 application topically 2 (two) times daily. (Patient not taking: Reported on 06/24/2021) 30 g 0   TURMERIC CURCUMIN PO Take 2 capsules by mouth daily. (Patient not taking: Reported on 06/24/2021)     No current facility-administered medications for  this visit.    VITAL SIGNS: BP 114/80   Pulse (!) 106   Temp 99.2 F (37.3 C) (Oral)   Resp 18   LMP  (LMP Unknown) Comment: negative urine pregnancy test  SpO2 98%  There were no vitals filed for this visit.  Estimated body mass index is 29.24 kg/m as calculated from the following:   Height as of 06/25/21: 5' 9" (1.753 m).   Weight as of 06/25/21: 198 lb (89.8 kg).  LABS: CBC:    Component Value Date/Time   WBC 15.4 (H) 06/26/2021 0507   HGB 9.2 (L) 06/26/2021 0507   HCT 26.8 (L) 06/26/2021 0507   PLT 195 06/26/2021 0507   MCV 100.0 06/26/2021 0507   NEUTROABS 3.6 06/24/2021 0943   LYMPHSABS 1.1 06/24/2021 0943   MONOABS 0.6 06/24/2021 0943   EOSABS 3.3 (H)  06/24/2021 0943   BASOSABS 0.1 06/24/2021 0943   Comprehensive Metabolic Panel:    Component Value Date/Time   NA 137 06/26/2021 0507   K 3.7 06/26/2021 0507   CL 104 06/26/2021 0507   CO2 24 06/26/2021 0507   BUN 7 06/26/2021 0507   CREATININE 0.58 06/26/2021 0507   GLUCOSE 100 (H) 06/26/2021 0507   CALCIUM 9.0 06/26/2021 0507   AST 57 (H) 06/24/2021 0943   ALT 86 (H) 06/24/2021 0943   ALKPHOS 82 06/24/2021 0943   BILITOT 0.4 06/24/2021 0943   PROT 6.6 06/24/2021 0943   ALBUMIN 3.8 06/24/2021 0943    RADIOGRAPHIC STUDIES: NM Sentinel Node Inj-No Rpt (Breast)  Result Date: 06/25/2021 Sulfur Colloid was injected by the Nuclear Medicine Technologist for sentinel lymph node localization.   DG Chest Port 1 View  Result Date: 06/26/2021 CLINICAL DATA:  Chest pain. EXAM: PORTABLE CHEST 1 VIEW COMPARISON:  December 13, 2020. FINDINGS: The heart size and mediastinal contours are within normal limits. Both lungs are clear. Right internal jugular Port-A-Cath is unchanged in position. The visualized skeletal structures are unremarkable. IMPRESSION: No active disease. Electronically Signed   By: Marijo Conception M.D.   On: 06/26/2021 11:00   ECHOCARDIOGRAM COMPLETE  Result Date: 06/16/2021    ECHOCARDIOGRAM REPORT   Patient Name:   YERALDINE FORNEY Date of Exam: 06/16/2021 Medical Rec #:  [id number]      Height:       69.0 in Accession #:    [id number]     Weight:       201.4 lb Date of Birth:  06-01-1991      BSA:          2.072 m Patient Age:    30 years       BP:           103/65 mmHg Patient Gender: F              HR:           80 bpm. Exam Location:  [facility] Procedure: 2D Echo, Color Doppler, Cardiac Doppler and Strain Analysis Indications:     R06.00 Dyspnea; Z09 Chemo  History:         Patient has prior history of Echocardiogram examinations, most                  recent 03/14/2021. Signs/Symptoms:Shortness of Breath. Asthma;                  Breast cancer.  Sonographer:     Charmayne Sheer Referring  Phys:  [id number] [place] R  Diagnosing Phys: Kathlyn Sacramento  MD  Sonographer Comments: No subcostal window. Global longitudinal strain was attempted. IMPRESSIONS  1. Left ventricular ejection fraction, by estimation, is 60 to 65%. The left ventricle has normal function. The left ventricle has no regional wall motion abnormalities. Left ventricular diastolic parameters were normal. The average left ventricular global longitudinal strain is -17.1 %. The global longitudinal strain is normal.  2. Right ventricular systolic function is normal. The right ventricular size is normal. Tricuspid regurgitation signal is inadequate for assessing PA pressure.  3. The mitral valve is normal in structure. No evidence of mitral valve regurgitation. No evidence of mitral stenosis.  4. The aortic valve is normal in structure. Aortic valve regurgitation is not visualized. No aortic stenosis is present.  5. The inferior vena cava is normal in size with greater than 50% respiratory variability, suggesting right atrial pressure of 3 mmHg. FINDINGS  Left Ventricle: Left ventricular ejection fraction, by estimation, is 60 to 65%. The left ventricle has normal function. The left ventricle has no regional wall motion abnormalities. The average left ventricular global longitudinal strain is -17.1 %. The global longitudinal strain is normal. The left ventricular internal cavity size was normal in size. There is no left ventricular hypertrophy. Left ventricular diastolic parameters were normal. Right Ventricle: The right ventricular size is normal. No increase in right ventricular wall thickness. Right ventricular systolic function is normal. Tricuspid regurgitation signal is inadequate for assessing PA pressure. Left Atrium: Left atrial size was normal in size. Right Atrium: Right atrial size was normal in size. Pericardium: There is no evidence of pericardial effusion. Mitral Valve: The mitral valve is normal in structure. No evidence of  mitral valve regurgitation. No evidence of mitral valve stenosis. MV peak gradient, 2.4 mmHg. The mean mitral valve gradient is 1.0 mmHg. Tricuspid Valve: The tricuspid valve is normal in structure. Tricuspid valve regurgitation is trivial. No evidence of tricuspid stenosis. Aortic Valve: The aortic valve is normal in structure. Aortic valve regurgitation is not visualized. No aortic stenosis is present. Aortic valve mean gradient measures 4.0 mmHg. Aortic valve peak gradient measures 7.5 mmHg. Aortic valve area, by VTI measures 1.91 cm. Pulmonic Valve: The pulmonic valve was normal in structure. Pulmonic valve regurgitation is not visualized. No evidence of pulmonic stenosis. Aorta: The aortic root is normal in size and structure. Venous: The inferior vena cava is normal in size with greater than 50% respiratory variability, suggesting right atrial pressure of 3 mmHg. IAS/Shunts: No atrial level shunt detected by color flow Doppler.  LEFT VENTRICLE PLAX 2D LVIDd:         4.58 cm  Diastology LVIDs:         3.06 cm  LV e' medial:    10.10 cm/s LV PW:         0.78 cm  LV E/e' medial:  7.0 LV IVS:        0.70 cm  LV e' lateral:   14.60 cm/s LVOT diam:     1.90 cm  LV E/e' lateral: 4.8 LV SV:         49 LV SV Index:   24       2D Longitudinal Strain LVOT Area:     2.84 cm 2D Strain GLS Avg:     -17.1 %  RIGHT VENTRICLE RV Basal diam:  2.24 cm LEFT ATRIUM             Index       RIGHT ATRIUM  Index LA diam:        2.70 cm 1.30 cm/m  RA Area:     11.30 cm LA Vol (A2C):   29.8 ml 14.38 ml/m RA Volume:   23.70 ml  11.44 ml/m LA Vol (A4C):   31.6 ml 15.25 ml/m LA Biplane Vol: 30.8 ml 14.86 ml/m  AORTIC VALVE                   PULMONIC VALVE AV Area (Vmax):    2.03 cm    PV Vmax:       1.01 m/s AV Area (Vmean):   1.86 cm    PV Vmean:      63.200 cm/s AV Area (VTI):     1.91 cm    PV VTI:        0.152 m AV Vmax:           137.00 cm/s PV Peak grad:  4.1 mmHg AV Vmean:          99.600 cm/s PV Mean grad:  2.0  mmHg AV VTI:            0.258 m AV Peak Grad:      7.5 mmHg AV Mean Grad:      4.0 mmHg LVOT Vmax:         97.90 cm/s LVOT Vmean:        65.300 cm/s LVOT VTI:          0.174 m LVOT/AV VTI ratio: 0.67  AORTA Ao Root diam: 2.50 cm MITRAL VALVE MV Area (PHT): 3.74 cm    SHUNTS MV Area VTI:   2.94 cm    Systemic VTI:  0.17 m MV Peak grad:  2.4 mmHg    Systemic Diam: 1.90 cm MV Mean grad:  1.0 mmHg MV Vmax:       0.78 m/s MV Vmean:      50.6 cm/s MV Decel Time: 203 msec MV E velocity: 70.70 cm/s MV A velocity: 55.30 cm/s MV E/A ratio:  1.28 Kathlyn Sacramento MD Electronically signed by Kathlyn Sacramento MD Signature Date/Time: 06/16/2021/2:52:45 PM    Final     PERFORMANCE STATUS (ECOG) : 1 - Symptomatic but completely ambulatory  Review of Systems Unless otherwise noted, a complete review of systems is negative.  Physical Exam General: NAD Cardiovascular: regular rate and rhythm Pulmonary: clear anterior/posterior fields Abdomen: soft, nontender, + bowel sounds GU: no suprapubic tenderness Extremities: no edema, no joint deformities Skin: no rashes Neurological: Nonfocal  Assessment and Plan- Patient is a 30 y.o. female Ms. Helayne Metsker is a 30 year old female with multiple medical problems including stage IIIb 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 breast cancer on systemic treatment with [facility] Keytruda chemotherapy pending bilateral mastectomies who presents to the clinic for evaluation of diarrhea   Diarrhea -symptoms highly suspicious for recurrent C. difficile colitis in setting of recent antibiotic usage.  Will send for C. difficile PCR and restart oral vancomycin if positive.  We will also send for GI stool studies.  Patient stools had normalized following previous course of oral vancomycin.  If recurrent or chronic C. difficile infection, could consider referral to GI for further management.  IV fluids today.  Hold antidiarrheals until stool studies.  Encouraged p.o. fluids and probiotics.  Postop pain/neoplasm  related pain -patient still has drains in place and is having some discomfort.  She is out of the [place] but reports that it was not very effective.  She is currently taking Tylenol for pain but  finds that it is not strong enough.  We will send in Rx for as needed oxycodone.  Anxiety/Depression -continue citalopram.  Refill Klonopin per patient request.  Patient in agreement for referral to counseling.  She was also given information for support groups.  RTC as needed  Patient expressed understanding and was in agreement with this plan. She also understands that She can call clinic at any time with any questions, concerns, or complaints.   Thank you for allowing me to participate in the care of this very pleasant patient.   Time Total: 25 minutes  Visit consisted of counseling and education dealing with the complex and emotionally intense issues of symptom management and palliative care in the setting of serious and potentially life-threatening illness.Greater than 50%  of this time was spent counseling and coordinating care related to the above assessment and plan.  Signed by: Altha Harm, PhD, NP-C

## 2021-07-10 NOTE — Telephone Encounter (Signed)
Sent over referral for therapy to [facility] to [phone] and received confirmation.

## 2021-07-10 NOTE — Progress Notes (Signed)
Pt reports that diarrhea has returned, and she is concerned that she has c-diff again. Denies abdominal pain/cramping. Reports occasional nausea.

## 2021-07-11 ENCOUNTER — Encounter: Payer: 59 | Admitting: Physician Assistant

## 2021-07-11 LAB — C DIFFICILE QUICK SCREEN W PCR REFLEX
C Diff antigen: NEGATIVE
C Diff interpretation: NOT DETECTED
C Diff toxin: NEGATIVE

## 2021-07-15 ENCOUNTER — Encounter: Payer: 59 | Admitting: Physician Assistant

## 2021-07-16 ENCOUNTER — Inpatient Hospital Stay: Payer: 59

## 2021-07-16 ENCOUNTER — Ambulatory Visit
Admission: RE | Admit: 2021-07-16 | Discharge: 2021-07-16 | Disposition: A | Discharge status: Home / Self Care | Payer: 59 | Source: Ambulatory Visit | Attending: Radiation Oncology | Admitting: Radiation Oncology

## 2021-07-16 ENCOUNTER — Other Ambulatory Visit: Payer: Self-pay

## 2021-07-16 ENCOUNTER — Other Ambulatory Visit: Payer: Self-pay | Admitting: *Deleted

## 2021-07-16 ENCOUNTER — Inpatient Hospital Stay (HOSPITAL_BASED_OUTPATIENT_CLINIC_OR_DEPARTMENT_OTHER): Payer: 59 | Admitting: Oncology

## 2021-07-16 ENCOUNTER — Encounter: Payer: Self-pay | Admitting: Oncology

## 2021-07-16 VITALS — BP 114/58 | HR 117 | Temp 99.4°F | Resp 20 | Wt 199.9 lb

## 2021-07-16 DIAGNOSIS — C50412 Malignant neoplasm of upper-outer quadrant of left female breast: Secondary | ICD-10-CM

## 2021-07-16 DIAGNOSIS — R110 Nausea: Secondary | ICD-10-CM

## 2021-07-16 DIAGNOSIS — T8149XA Infection following a procedure, other surgical site, initial encounter: Secondary | ICD-10-CM | POA: Diagnosis not present

## 2021-07-16 DIAGNOSIS — R197 Diarrhea, unspecified: Secondary | ICD-10-CM | POA: Diagnosis not present

## 2021-07-16 DIAGNOSIS — Z171 Estrogen receptor negative status [ER-]: Secondary | ICD-10-CM

## 2021-07-16 LAB — COMPREHENSIVE METABOLIC PANEL
ALT: 14 U/L (ref 0–44)
AST: 16 U/L (ref 15–41)
Albumin: 3.1 g/dL — ABNORMAL LOW (ref 3.5–5.0)
Alkaline Phosphatase: 102 U/L (ref 38–126)
Anion gap: 13 (ref 5–15)
BUN: 8 mg/dL (ref 6–20)
CO2: 23 mmol/L (ref 22–32)
Calcium: 8.4 mg/dL — ABNORMAL LOW (ref 8.9–10.3)
Chloride: 102 mmol/L (ref 98–111)
Creatinine, Ser: 0.65 mg/dL (ref 0.44–1.00)
GFR, Estimated: >60 mL/min (ref >60)
Glucose, Bld: 90 mg/dL (ref 70–99)
Potassium: 3.3 mmol/L — ABNORMAL LOW (ref 3.5–5.1)
Sodium: 138 mmol/L (ref 135–145)
Total Bilirubin: 0.3 mg/dL (ref 0.3–1.2)
Total Protein: 6.6 g/dL (ref 6.5–8.1)

## 2021-07-16 LAB — CBC WITH DIFFERENTIAL/PLATELET
Abs Immature Granulocytes: 0.03 10*3/uL (ref 0.00–0.07)
Basophils Absolute: 0.1 10*3/uL (ref 0.0–0.1)
Basophils Relative: 1 %
Eosinophils Absolute: 0.6 10*3/uL — ABNORMAL HIGH (ref 0.0–0.5)
Eosinophils Relative: 7 %
HCT: 27.9 % — ABNORMAL LOW (ref 36.0–46.0)
Hemoglobin: 9.3 g/dL — ABNORMAL LOW (ref 12.0–15.0)
Immature Granulocytes: 0 %
Lymphocytes Relative: 12 %
Lymphs Abs: 1 10*3/uL (ref 0.7–4.0)
MCH: 31.5 pg (ref 26.0–34.0)
MCHC: 33.3 g/dL (ref 30.0–36.0)
MCV: 94.6 fL (ref 80.0–100.0)
Monocytes Absolute: 0.9 10*3/uL (ref 0.1–1.0)
Monocytes Relative: 11 %
Neutro Abs: 6 10*3/uL (ref 1.7–7.7)
Neutrophils Relative %: 69 %
Platelets: 386 10*3/uL (ref 150–400)
RBC: 2.95 MIL/uL — ABNORMAL LOW (ref 3.87–5.11)
RDW: 14.1 % (ref 11.5–15.5)
WBC: 8.6 10*3/uL (ref 4.0–10.5)
nRBC: 0 % (ref 0.0–0.2)

## 2021-07-16 MED ORDER — PROCHLORPERAZINE MALEATE 10 MG PO TABS
10.0000 mg | ORAL_TABLET | Freq: Four times a day (QID) | ORAL | Status: AC | PRN
  Administered 2021-07-16: 10 mg via ORAL
  Filled 2021-07-16: qty 1

## 2021-07-16 MED ORDER — GABAPENTIN 300 MG PO CAPS: 300.0000 mg | ORAL_CAPSULE | Freq: Three times a day (TID) | ORAL | 0 refills | Status: DC

## 2021-07-16 MED ORDER — METHOCARBAMOL 500 MG PO TABS: 500.0000 mg | ORAL_TABLET | Freq: Three times a day (TID) | ORAL | 0 refills | Status: AC

## 2021-07-16 MED ORDER — ONDANSETRON HCL 8 MG PO TABS: 8.0000 mg | ORAL_TABLET | Freq: Three times a day (TID) | ORAL | 1 refills | Status: DC | PRN

## 2021-07-16 NOTE — Progress Notes (Signed)
After surgery about three weeks ago, drainage was looking fine; however per husband drainage is increasing with a mustard color, seems raw on both sides, pt c/o severe nausea and severe back pain radiating to the front. They are curious about an infection. Spoke to surgery yesterday and were told they can go see them hoever they decided not to since they had an appointment today. Also, about four days ago got up to shower and withifive minutes in the shower felt like her BP was dropping; her vision went black, pt got out and sat down.

## 2021-07-16 NOTE — Consult Note (Signed)
NEW PATIENT EVALUATION  Name: Vickie Mathis  MRN: [id number]  Date:   07/16/2021     DOB: 1991-04-08   This 30 y.o. 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   PREVIOUS INVESTIGATIONS:  MRI scans mammograms and ultrasound reviewed Pathology report reviewed Clinical notes reviewed Case presented at breast conference  HPI: Patient is a 30 year old female who presented with a self palpated mass in her left breast.  She eventually underwent mammogram and ultrasound showing a 2.7 mass at the 12 o'clock position 6 cm from the nipple.  Axillary ultrasound demonstrated 2 lymph nodes with mild cortical thickening.  There was some skin thickening in the lower inner quadrant left breast mammographically.  Lymph node and mass were biopsied and positive for triple negative invasive mammary carcinoma grade 3.  Lymph node biopsy is also suspicious for extracapsular extension.  MRI scan showed 2.5 x 2.3 mass at the 12 o'clock position of the left breast there was however mass in the anterior linear extension combined to measure 4.3 cm.  There is also noted abnormality in the right breast although I do not see final pathology report on the initial biopsy of that area.  At the time of bilateral mastectomy there was no evidence of disease in the right breast.  There was a complete response in the left breast.  4 lymph nodes were negative for metastatic disease.  She has had tissue expanders placed for breast reconstruction.   She apparently has a infection of the right breast at this time being seen by surgeon will be started on antibiotic therapy.  PLANNED TREATMENT REGIMEN: Left breast and peripheral lymphatic radiation  PAST MEDICAL HISTORY:  has a past medical history of Anxiety, Asthma, Breast cancer ([place]) (11/2020), Depression, Family history of cancer, History of chemotherapy, and Varicose veins of bilateral lower extremities with pain.    PAST SURGICAL HISTORY:  Past Surgical History:  Procedure Laterality Date   APPENDECTOMY  2018   BILATERAL TOTAL MASTECTOMY WITH AXILLARY LYMPH NODE DISSECTION Bilateral 06/25/2021   Procedure: BILATERAL TOTAL MASTECTOMY WITH LEFT AXILLARY LYMPH NODE BIOPSY VS. AXILLARY NODE DISSECTION;  Surgeon: Herbert Pun, MD;  Location: [facility] ORS;  Service: General;  Laterality: Bilateral;  [place], 1.5 hours Cintron-Diaz 2.5 hours   BREAST BIOPSY Left 11/26/2020   vision 12:00 6cmfn [facility]   BREAST BIOPSY Left 11/26/2020   LN bx, hydro marker,  fragments of macrometastatic carcinoma   BREAST RECONSTRUCTION WITH PLACEMENT OF TISSUE EXPANDER AND FLEX HD (ACELLULAR HYDRATED DERMIS) Bilateral 06/25/2021   Procedure: BREAST RECONSTRUCTION WITH PLACEMENT OF TISSUE EXPANDER AND FLEX HD (ACELLULAR HYDRATED DERMIS);  Surgeon: Wallace Going, DO;  Location: [facility] ORS;  Service: Plastics;  Laterality: Bilateral;   PORTACATH PLACEMENT Right 12/13/2020   Procedure: INSERTION PORT-A-CATH;  Surgeon: Herbert Pun, MD;  Location: [facility] ORS;  Service: General;  Laterality: Right;    FAMILY HISTORY: family history includes Cancer in her paternal great-grandmother and another family member; Diabetes in her father, paternal aunt, and paternal grandmother; Thyroid disease in her maternal aunt, maternal grandmother, and  mother; Uterine cancer in her paternal aunt and paternal grandmother; Varicose Veins in her father.  SOCIAL HISTORY:  reports that she has never smoked. She has never  used smokeless tobacco. She reports that she does not currently use alcohol. She reports current drug use. Drug: Marijuana.  ALLERGIES: Carboplatin, Amoxicillin, and Sulfa antibiotics  MEDICATIONS:  Current Outpatient Medications  Medication Sig Dispense Refill   busPIRone (BUSPAR) 15 MG tablet Take 15 mg by mouth 2 (two) times daily.     citalopram (CELEXA) 40 MG tablet Take 1 tablet (40 mg total) by mouth daily. (Patient taking differently: Take 40 mg by mouth at bedtime.) 90 tablet 2   clonazePAM (KLONOPIN) 0.5 MG tablet Take 1 tablet (0.5 mg total) by mouth 3 (three) times daily as needed. 60 tablet 0   Cyanocobalamin (B-12 PO) Take 1 tablet by mouth daily.     cyclobenzaprine (FLEXERIL) 5 MG tablet Take 1 tablet (5 mg total) by mouth 3 (three) times daily as needed for muscle spasms. (Patient not taking: No sig reported) 30 tablet 0   docusate sodium (COLACE) 100 MG capsule Take 100 mg by mouth daily as needed for mild constipation. (Patient not taking: No sig reported)     folic acid (FOLVITE) 1 MG tablet TAKE 2 TABLETS BY MOUTH EVERY DAY (Patient not taking: No sig reported) 180 tablet 0   gabapentin (NEURONTIN) 300 MG capsule Take 1 capsule (300 mg total) by mouth 3 (three) times daily for 14 days. 42 capsule 0   HYDROcodone-acetaminophen (NORCO/VICODIN) 5-325 MG tablet Take 1-2 tablets by mouth every 4 (four) hours as needed. (Patient not taking: Reported on 07/16/2021)     ibuprofen (ADVIL) 200 MG tablet Take 400 mg by mouth every 6 (six) hours as needed for moderate pain. (Patient not taking: No sig reported)     lidocaine-prilocaine (EMLA) cream Apply 1 application topically daily as needed (prior to port access). 30 g 3   methocarbamol (ROBAXIN) 500 MG tablet Take 1 tablet (500 mg total) by mouth 3 (three) times daily for 14 days. 42 tablet 0   Multiple Vitamin (MULTIVITAMIN WITH MINERALS) TABS tablet Take 1 tablet by mouth daily. (Patient not taking: No sig reported)     OLANZapine  (ZYPREXA) 10 MG tablet TAKE 1 TABLET BY MOUTH EVERYDAY AT BEDTIME (Patient not taking: No sig reported) 90 tablet 1   ondansetron (ZOFRAN ODT) 4 MG disintegrating tablet Take 1 tablet (4 mg total) by mouth every 8 (eight) hours as needed for nausea or vomiting. (Patient not taking: No sig reported) 20 tablet 0   ondansetron (ZOFRAN) 8 MG tablet Take 1 tablet (8 mg total) by mouth every 8 (eight) hours as needed for refractory nausea / vomiting. Start on day 3 after chemo. 60 tablet 1   oxyCODONE (OXY IR/ROXICODONE) 5 MG immediate release tablet Take 1 tablet (5 mg total) by mouth every 4 (four) hours as needed for severe pain. (Patient not taking: Reported on 07/16/2021) 30 tablet 0   potassium chloride SA (KLOR-CON) 20 MEQ tablet Take 1 tablet (20 mEq total) by mouth daily. (Patient not taking: No sig reported) 3 tablet 0   prochlorperazine (COMPAZINE) 10 MG tablet Take 1 tablet (10 mg total) by mouth every 6 (six) hours as needed (Nausea or vomiting). (Patient not taking: Reported on 07/16/2021) 30 tablet 1   triamcinolone ointment (KENALOG) 0.5 % Apply 1 application topically 2 (two) times daily. (Patient not taking: No sig reported) 30 g 0   TURMERIC  CURCUMIN PO Take 2 capsules by mouth daily. (Patient not taking: No sig reported)     No current facility-administered medications for this encounter.   Facility-Administered Medications Ordered in Other Encounters  Medication Dose Route Frequency Provider Last Rate Last Admin   prochlorperazine (COMPAZINE) tablet 10 mg  10 mg Oral Q6H PRN Sindy Guadeloupe, MD   10 mg at 07/16/21 1407    ECOG PERFORMANCE STATUS:  0 - Asymptomatic  REVIEW OF SYSTEMS: Patient denies any weight loss, fatigue, weakness, fever, chills or night sweats. Patient denies any loss of vision, blurred vision. Patient denies any ringing  of the ears or hearing loss. No irregular heartbeat. Patient denies heart murmur or history of fainting. Patient denies any chest pain or pain  radiating to her upper extremities. Patient denies any shortness of breath, difficulty breathing at night, cough or hemoptysis. Patient denies any swelling in the lower legs. Patient denies any nausea vomiting, vomiting of blood, or coffee ground material in the vomitus. Patient denies any stomach pain. Patient states has had normal bowel movements no significant constipation or diarrhea. Patient denies any dysuria, hematuria or significant nocturia. Patient denies any problems walking, swelling in the joints or loss of balance. Patient denies any skin changes, loss of hair or loss of weight. Patient denies any excessive worrying or anxiety or significant depression. Patient denies any problems with insomnia. Patient denies excessive thirst, polyuria, polydipsia. Patient denies any swollen glands, patient denies easy bruising or easy bleeding. Patient denies any recent infections, allergies or URI. Patient "s visual fields have not changed significantly in recent time.  At the time of MRI there were 2 mildly enlarged left internal mammary lymph nodes also noted.   PHYSICAL EXAM: LMP  (LMP Unknown) Comment: negative urine pregnancy test Patient is status post bilateral tissue expanders in each breast.  There is obvious purulent at drainage from the right breast and erythema consistent with infection.  She is being started on antibiotic therapy and possible to have drainage of the right breast.  No axillary or supraclavicular adenopathy is appreciated.  Well-developed well-nourished patient in NAD. HEENT reveals PERLA, EOMI, discs not visualized.  Oral cavity is clear. No oral mucosal lesions are identified. Neck is clear without evidence of cervical or supraclavicular adenopathy. Lungs are clear to A&P. Cardiac examination is essentially unremarkable with regular rate and rhythm without murmur rub or thrill. Abdomen is benign with no organomegaly or masses noted. Motor sensory and DTR levels are equal and  symmetric in the upper and lower extremities. Cranial nerves II through XII are grossly intact. Proprioception is intact. No peripheral adenopathy or edema is identified. No motor or sensory levels are noted. Crude visual fields are within normal range.  LABORATORY DATA: Pathology report reviewed    RADIOLOGY RESULTS: Mammograms ultrasound and MRI scans reviewed compatible with above-stated findings   IMPRESSION: Anatomic stage IIb triple negative invasive mammary carcinoma left breast status post neoadjuvant chemotherapy status post bilateral mastectomies in process of breast reconstruction in 30 year old female status post neoadjuvant chemotherapy  PLAN: Present time patient will see surgeon tomorrow for evaluation of possible drainage of the right breast.  I have set her up for CT simulation of the left breast and peripheral lymphatics.  Would plan on delivering 5040 cGy to both areas.  We will also include internal mammary nodes based on her original MRI findings.  Risks and benefits of treatment including skin reaction fatigue alteration of blood counts possible inclusion of superficial lung possible  contraction around her implants all were discussed in detail with the patient.  Patient will be on Keytruda under medical oncology's direction and we will proceed with radiation concurrently.  I would like to take this opportunity to thank you for allowing me to participate in the care of your patient.Noreene Filbert, MD

## 2021-07-17 ENCOUNTER — Encounter: Payer: Self-pay | Admitting: Internal Medicine

## 2021-07-17 ENCOUNTER — Encounter: Payer: Self-pay | Admitting: Oncology

## 2021-07-17 MED ORDER — OXYCODONE HCL 5 MG PO TABS: 5.0000 mg | ORAL_TABLET | Freq: Three times a day (TID) | ORAL | 0 refills | Status: DC | PRN

## 2021-07-18 ENCOUNTER — Encounter: Payer: Self-pay | Admitting: Plastic Surgery

## 2021-07-18 ENCOUNTER — Encounter: Payer: 59 | Admitting: Plastic Surgery

## 2021-07-18 ENCOUNTER — Other Ambulatory Visit: Payer: Self-pay

## 2021-07-18 ENCOUNTER — Ambulatory Visit (INDEPENDENT_AMBULATORY_CARE_PROVIDER_SITE_OTHER): Payer: 59 | Admitting: Plastic Surgery

## 2021-07-18 DIAGNOSIS — C50919 Malignant neoplasm of unspecified site of unspecified female breast: Secondary | ICD-10-CM

## 2021-07-18 NOTE — Progress Notes (Signed)
   Subjective:    Patient ID: Vickie Mathis, female    DOB: 03/19/1991, 30 y.o.   MRN: [id number]  The patient is a 30 year old female here with her husband for follow-up on her bilateral breast surgery.  She had bilateral mastectomies with expanders placed.  She presented at the general surgery office with a milky drainage in the JP drain on the right.  There is a small area of redness on the right breast at the lower pole.  The left side looks fine.  I was able to drain some out today just by massaging the breast.  It is tender to palpation.  She is otherwise doing well and they feel there has been an improvement since starting the antibiotics.     Review of Systems  Constitutional:  Positive for activity change and appetite change.  Eyes: Negative.   Respiratory:  Positive for chest tightness.   Cardiovascular: Negative.   Gastrointestinal: Negative.   Endocrine: Negative.   Genitourinary: Negative.   Hematological: Negative.   Psychiatric/Behavioral: Negative.        Objective:   Physical Exam Vitals and nursing note reviewed.  Constitutional:      Appearance: Normal appearance.  HENT:     Head: Normocephalic and atraumatic.  Cardiovascular:     Rate and Rhythm: Normal rate.     Pulses: Normal pulses.  Pulmonary:     Effort: Pulmonary effort is normal. No respiratory distress.  Skin:    Capillary Refill: Capillary refill takes less than 2 seconds.     Coloration: Skin is not jaundiced or pale.     Findings: Erythema present. No bruising or lesion.  Neurological:     Mental Status: She is alert and oriented to person, place, and time.  Psychiatric:        Mood and Affect: Mood normal.        Behavior: Behavior normal.        Thought Content: Thought content normal.      Assessment & Plan:     ICD-10-CM   1. Invasive carcinoma of breast ([place])  C50.919       We talked about the options of the following: Go to the OR to remove everything. Go to the OR washout put  a new expander and place. Wait another day or 2 and see if the antibiotic helps to clear it up.  The patient wants to wait a day or 2.  I will give them a call in the morning.  They know to stop eating and drinking if at any point they feel like they are going to need to go back to the OR.

## 2021-07-19 ENCOUNTER — Encounter: Payer: Self-pay | Admitting: Oncology

## 2021-07-19 ENCOUNTER — Encounter: Payer: Self-pay | Admitting: Internal Medicine

## 2021-07-19 NOTE — Progress Notes (Signed)
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  She sought medical attention recently after thinking it was a possible cyst for all this while.She underwent a diagnostic bilateral mammogram and ultrasound which showed a 2.7 cm mass at the 12 o'clock position 6 cm from the nipple.  Ultrasound of the left axilla demonstrates 2 lymph nodes with mild thickened cortices of 4 mm.  There is skin thickening on the lower inner quadrant of the left breast on mammography.  Both the breast mass and the lymph node were biopsied and was positive for invasive mammary carcinoma grade 3.  Lymph node was also suspicious for extracapsular extension.    ER/PR and HER-2 negative   MRI showed irregular enhancing mass at the 12 o'clock position of the left breast measuring 2.5 x 2.3 cm and a linear component extending 2.3 cm anteriorly.  The mass in the anterior linear extension combined measure 4.3 cm.  3.9 cm in the cephalocaudal dimension.  Also an area of clumped non-mass enhancement in the posterior aspect of the lower quadrant of the left breast measuring 2.6 x 0.9 cm which looks suspicious.  2 enlarged left axillary lymph nodes and 2 mildly enlarged internal mammary lymph nodes.  4.3 cm clumped area of non-mass enhancement in the outer quadrant of the right breast.  3 right axillary lymph nodes with  mild cortical thickening.   Patient underwent biopsy of the right breast non-mass enhancement and that was negative for malignancy   CT scan showed mildly enlarged right inguinal lymph nodes nonspecific.  Left upper breast mass with prominent left axillary lymph nodes.  Subcentimeter pulmonary nodules which are calcified and compatible with benign old granulomatous disease.  0.6 5.4 cm lucent lesion in the left first rib possibly a hemangioma or benign lesion.   Patient developed significant infusion reaction to carboplatin with dose 9 when her blood pressure dropped and she became tachycardic and significantly nauseous.  Interval history-patient reports significant bilateral chest wall pain since surgery.The right breast drain has been draining cloudy fluid.  Denies any fever at home she has been having occasional nausea.  Rare episodes of diarrhea.  ECOG PS- 1 Pain scale- 6 Opioid associated constipation- no  Review of systems- Review of Systems  Constitutional:  Positive for malaise/fatigue. Negative for chills, fever and weight loss.  HENT:  Negative for congestion, ear discharge and nosebleeds.   Eyes:  Negative for blurred vision.  Respiratory:  Negative for cough, hemoptysis, sputum production, shortness of breath and wheezing.   Cardiovascular:  Negative for chest pain, palpitations, orthopnea and claudication.  Gastrointestinal:  Negative for abdominal pain, blood in stool, constipation, diarrhea, heartburn, melena, nausea and vomiting.  Genitourinary:  Negative for dysuria, flank pain, frequency, hematuria and urgency.  Musculoskeletal:  Negative for back pain, joint pain and myalgias.       Bilateral chest wall pain after mastectomy  Skin:  Negative for rash.  Neurological:  Negative for dizziness, tingling, focal weakness, seizures, weakness and headaches.  Endo/Heme/Allergies:  Does not bruise/bleed easily.  Psychiatric/Behavioral:  Negative for depression and suicidal ideas.  The patient does not have insomnia.      Allergies  Allergen Reactions   Carboplatin Shortness Of Breath, Nausea And Vomiting and Other (See Comments)    Flushing- chest , face, neck , arm including hand   Amoxicillin     Other reaction(s): "too young to remember what they do to me"   Sulfa Antibiotics     Other reaction(s): "too young to remember what they do to me"     Past Medical History:  Diagnosis Date   Anxiety    Asthma    Breast cancer (HCC) 11/2020   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 breast ca   Depression    Family history of cancer    History of chemotherapy    Varicose veins of bilateral lower extremities with pain      Past Surgical History:  Procedure Laterality Date   APPENDECTOMY  2018   BILATERAL TOTAL MASTECTOMY WITH AXILLARY LYMPH NODE DISSECTION Bilateral 06/25/2021   Procedure: BILATERAL TOTAL MASTECTOMY WITH LEFT AXILLARY LYMPH NODE BIOPSY VS. AXILLARY NODE DISSECTION;  Surgeon: Carolan Shiver, MD;  Location: [facility] ORS;  Service: General;  Laterality: Bilateral;  [place], 1.5 hours Cintron-Diaz 2.5 hours   BREAST BIOPSY Left 11/26/2020   vision 12:00 6cmfn [facility]   BREAST BIOPSY Left 11/26/2020   LN bx, hydro marker,  fragments of macrometastatic carcinoma   BREAST RECONSTRUCTION WITH PLACEMENT OF TISSUE EXPANDER AND FLEX HD (ACELLULAR HYDRATED DERMIS) Bilateral 06/25/2021   Procedure: BREAST RECONSTRUCTION WITH PLACEMENT OF TISSUE EXPANDER AND FLEX HD (ACELLULAR HYDRATED DERMIS);  Surgeon: Peggye Form, DO;  Location: [facility] ORS;  Service: Plastics;  Laterality: Bilateral;   PORTACATH PLACEMENT Right 12/13/2020   Procedure: INSERTION PORT-A-CATH;  Surgeon: Carolan Shiver, MD;  Location: [facility] ORS;  Service: General;  Laterality: Right;    Social History   Socioeconomic History   Marital status: Married    Spouse name: Not on file   Number of children: Not on file   Years of education: Not on file   Highest education level: Not on file   Occupational History   Not on file  Tobacco Use   Smoking status: Never   Smokeless tobacco: Never  Vaping Use   Vaping Use: Never used  Substance and Sexual Activity   Alcohol use: Not Currently    Comment: occassinally    Drug use: Yes    Types: Marijuana    Comment: occ   Sexual activity: Yes  Other Topics Concern   Not on file  Social History Narrative   Not on file   Social Determinants of Health   Financial Resource Strain: Not on file  Food Insecurity: Not on file  Transportation Needs: Not on file  Physical Activity: Not on file  Stress: Not on file  Social Connections: Not on file  Intimate Partner Violence: Not on file    Family History  Problem Relation Age of Onset   Diabetes Father    Varicose Veins Father    Diabetes Paternal Aunt    Uterine cancer Paternal Aunt        precancerous   Diabetes Paternal Grandmother    Uterine cancer Paternal Grandmother        precancerous   Thyroid disease Mother    Thyroid disease Maternal Aunt    Thyroid disease Maternal Grandmother  Cancer Other        stomach vs ovarian/cervical   Cancer Paternal Great-grandmother        unk     Current Outpatient Medications:    busPIRone (BUSPAR) 15 MG tablet, Take 15 mg by mouth 2 (two) times daily., Disp: , Rfl:    citalopram (CELEXA) 40 MG tablet, Take 1 tablet (40 mg total) by mouth daily. (Patient taking differently: Take 40 mg by mouth at bedtime.), Disp: 90 tablet, Rfl: 2   clonazePAM (KLONOPIN) 0.5 MG tablet, Take 1 tablet (0.5 mg total) by mouth 3 (three) times daily as needed., Disp: 60 tablet, Rfl: 0   Cyanocobalamin (B-12 PO), Take 1 tablet by mouth daily., Disp: , Rfl:    lidocaine-prilocaine (EMLA) cream, Apply 1 application topically daily as needed (prior to port access)., Disp: 30 g, Rfl: 3   cyclobenzaprine (FLEXERIL) 5 MG tablet, Take 1 tablet (5 mg total) by mouth 3 (three) times daily as needed for muscle spasms., Disp: 30 tablet, Rfl: 0   docusate  sodium (COLACE) 100 MG capsule, Take 100 mg by mouth daily as needed for mild constipation., Disp: , Rfl:    folic acid (FOLVITE) 1 MG tablet, TAKE 2 TABLETS BY MOUTH EVERY DAY, Disp: 180 tablet, Rfl: 0   gabapentin (NEURONTIN) 300 MG capsule, Take 1 capsule (300 mg total) by mouth 3 (three) times daily for 14 days., Disp: 42 capsule, Rfl: 0   HYDROcodone-acetaminophen (NORCO/VICODIN) 5-325 MG tablet, Take 1-2 tablets by mouth every 4 (four) hours as needed., Disp: , Rfl:    ibuprofen (ADVIL) 200 MG tablet, Take 400 mg by mouth every 6 (six) hours as needed for moderate pain., Disp: , Rfl:    methocarbamol (ROBAXIN) 500 MG tablet, Take 1 tablet (500 mg total) by mouth 3 (three) times daily for 14 days., Disp: 42 tablet, Rfl: 0   Multiple Vitamin (MULTIVITAMIN WITH MINERALS) TABS tablet, Take 1 tablet by mouth daily., Disp: , Rfl:    OLANZapine (ZYPREXA) 10 MG tablet, TAKE 1 TABLET BY MOUTH EVERYDAY AT BEDTIME, Disp: 90 tablet, Rfl: 1   ondansetron (ZOFRAN ODT) 4 MG disintegrating tablet, Take 1 tablet (4 mg total) by mouth every 8 (eight) hours as needed for nausea or vomiting., Disp: 20 tablet, Rfl: 0   ondansetron (ZOFRAN) 8 MG tablet, Take 1 tablet (8 mg total) by mouth every 8 (eight) hours as needed for refractory nausea / vomiting. Start on day 3 after chemo., Disp: 60 tablet, Rfl: 1   oxyCODONE (OXY IR/ROXICODONE) 5 MG immediate release tablet, Take 1 tablet (5 mg total) by mouth every 8 (eight) hours as needed for severe pain., Disp: 30 tablet, Rfl: 0   potassium chloride SA (KLOR-CON) 20 MEQ tablet, Take 1 tablet (20 mEq total) by mouth daily., Disp: 3 tablet, Rfl: 0   prochlorperazine (COMPAZINE) 10 MG tablet, Take 1 tablet (10 mg total) by mouth every 6 (six) hours as needed (Nausea or vomiting)., Disp: 30 tablet, Rfl: 1   triamcinolone ointment (KENALOG) 0.5 %, Apply 1 application topically 2 (two) times daily., Disp: 30 g, Rfl: 0   TURMERIC CURCUMIN PO, Take 2 capsules by mouth daily.,  Disp: , Rfl:  No current facility-administered medications for this visit.  Facility-Administered Medications Ordered in Other Visits:    prochlorperazine (COMPAZINE) tablet 10 mg, 10 mg, Oral, Q6H PRN, Sindy Guadeloupe, MD, 10 mg at 07/16/21 1407  Physical exam:  Vitals:   07/16/21 1329  BP: (!) 114/58  Pulse: Marland Kitchen)  117  Resp: 20  Temp: 99.4 F (37.4 C)  SpO2: 98%  Weight: 199 lb 14.4 oz (90.7 kg)   Physical Exam Constitutional:      Comments: Sitting in a wheelchair.  Appears fatigued  Cardiovascular:     Rate and Rhythm: Regular rhythm. Tachycardia present.     Heart sounds: Normal heart sounds.  Pulmonary:     Effort: Pulmonary effort is normal.     Breath sounds: Normal breath sounds.  Abdominal:     General: Bowel sounds are normal.     Palpations: Abdomen is soft.  Skin:    General: Skin is warm and dry.  Neurological:     Mental Status: She is alert and oriented to person, place, and time.    Chest wall exam: Patient is s/p bilateral mastectomy with immediate reconstruction.  Bilateral breast drains in place.  The left breast drain has scant serosanguineous discharge.  There is more drainage noted in the right breast drain which appears cloudy   CMP Latest Ref Rng & Units 07/16/2021  Glucose 70 - 99 mg/dL 90  BUN 6 - 20 mg/dL 8  Creatinine 0.44 - 1.00 mg/dL 0.65  Sodium 135 - 145 mmol/L 138  Potassium 3.5 - 5.1 mmol/L 3.3(L)  Chloride 98 - 111 mmol/L 102  CO2 22 - 32 mmol/L 23  Calcium 8.9 - 10.3 mg/dL 8.4(L)  Total Protein 6.5 - 8.1 g/dL 6.6  Total Bilirubin 0.3 - 1.2 mg/dL 0.3  Alkaline Phos 38 - 126 U/L 102  AST 15 - 41 U/L 16  ALT 0 - 44 U/L 14   CBC Latest Ref Rng & Units 07/16/2021  WBC 4.0 - 10.5 K/uL 8.6  Hemoglobin 12.0 - 15.0 g/dL 9.3(L)  Hematocrit 36.0 - 46.0 % 27.9(L)  Platelets 150 - 400 K/uL 386      NM Sentinel Node Inj-No Rpt (Breast)  Result Date: 06/25/2021 Sulfur Colloid was injected by the Nuclear Medicine Technologist for  sentinel lymph node localization.   DG Chest Port 1 View  Result Date: 06/26/2021 CLINICAL DATA:  Chest pain. EXAM: PORTABLE CHEST 1 VIEW COMPARISON:  December 13, 2020. FINDINGS: The heart size and mediastinal contours are within normal limits. Both lungs are clear. Right internal jugular Port-A-Cath is unchanged in position. The visualized skeletal structures are unremarkable. IMPRESSION: No active disease. Electronically Signed   By: Marijo Conception M.D.   On: 06/26/2021 11:00     Assessment and plan- Patient is a 30 y.o. female with stage IIIb triple negative breast cancer of the left breast cT2 N1 M0.  She is s/p neoadjuvant chemotherapy as per keynote 522 protocol and here to discuss final pathology results and further management  Discussed the results of final pathology with the patient in detail which showed a pathological complete response.  There was no residual tumor noted in her left breast.  Negative margins.  2 sentinel lymph nodes negative for malignancy.  Axillary tissue with RF tag showed adipose tissue with focal fat necrosis and negative for malignancy.  No malignancy noted in the right breast as well.  Patient has therefore had a pathological complete response with keynote 522 regimen.  She will now be due for 9 cycles of adjuvant Keytruda which I will tentatively plan to start in about 3 weeks time.  She did have biopsy-proven positive axillary lymph node as well as borderline suspicious internal mammary lymph nodes.  She will therefore benefit from adjuvant radiation treatment as well.  Presently patient  has bilateral breast drains in place and the right one is draining more cloudy discharge.  Patient is tachycardic today and also has significant bilateral postmastectomy pain.  I did get in touch with Dr. Peyton Najjar who also came and saw her and there is a concern for ongoing wound infection.  She has been started on ciprofloxacin and until her acute issues resolve she cannot start  [place] or radiation treatment.  I will tentatively see her back in 3 weeks time  Patient did test for C. difficile positive prior to surgery but her repeat testing was negative when she complained of some diarrhea.  Her diarrhea is significantly better today and we will continue to monitor.  Patient does report significant pain from her mastectomy and I have given her 30 tablets of oxycodone today   Visit Diagnosis 1. Diarrhea, unspecified type   2. Malignant neoplasm of upper-outer quadrant of left breast in female, estrogen receptor negative ([place])      Dr. Randa Evens, MD, MPH [facility] at [facility] [phone] 07/19/2021 7:39 AM

## 2021-07-22 ENCOUNTER — Encounter: Payer: Self-pay | Admitting: Internal Medicine

## 2021-07-22 ENCOUNTER — Encounter: Payer: Self-pay | Admitting: Oncology

## 2021-07-22 NOTE — Progress Notes (Signed)
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  Patient was last seen here in clinic on 07/18/2021.  At that time, she was being treated with antibiotics for infection after she was noted to have milky drainage from JP drain at her appointment with radiation oncology.  Additional drainage occurred with this office of the affected breast.  She was tender on exam, but felt as though she was improving since onset of antibiotics.  Erythema was noted.  Options were discussed which included watchful waiting as well as going to the OR for either total removal or washout and expander replacement.  Patient decided that she would prefer to wait and see how she responded to continued antibiotics.  Today, patient is doing well.  She feels largely improved since initiating antibiotics.  She endorses continued drainage of 40 to 60 cc/day milky discharge from right sided JP drain.  Her left-sided JP drain however is only draining 5 cc/day, clearish serosanguineous drainage.  She reports that from a pain standpoint she has improved considerably.  Denies any left-sided symptoms whatsoever.  Denies any fevers, redness, streaking, or other symptoms.  She is accompanied by her husband at bedside who has been assisting with her care.  Physical exam largely reassuring.  Right breast without any redness.  Not particularly tender to palpation.  Tape at JP insertion site is removed revealing continued purulence at insertion site.  Not particularly malodorous.  No significant surrounding erythema or induration.  No obvious subcutaneous fluid collections.  Left breast is unremarkable.  Nontender.  No redness.  Tape at JP drain insertion site is removed and no concerning findings.  Left-sided JP drain is removed  without complication.  Right-sided JP drain will remain until at least the next encounter.  We will have patient follow-up in 1 week.  Asked that they return to clinic should she develop any new or worsening symptoms.

## 2021-07-23 ENCOUNTER — Ambulatory Visit: Payer: 59

## 2021-07-24 ENCOUNTER — Telehealth: Payer: Self-pay

## 2021-07-24 NOTE — Telephone Encounter (Signed)
Patient called to let us know that she finished taking her antibiotics yesterday but the infection doesn't seem to be gone.  Patient said it has improved but there is still some milky discharge in her tubes.  She said she isn't sure whether or not she needs more antibiotics or what the plan will be going forward.  Patient said she does have an appointment with [place] tomorrow morning.  Please call.  *Patient's preferred pharmacy is [facility] [address] in [place].

## 2021-07-25 ENCOUNTER — Ambulatory Visit (INDEPENDENT_AMBULATORY_CARE_PROVIDER_SITE_OTHER): Payer: 59 | Admitting: Physician Assistant

## 2021-07-25 ENCOUNTER — Other Ambulatory Visit: Payer: Self-pay

## 2021-07-25 DIAGNOSIS — Z9889 Other specified postprocedural states: Secondary | ICD-10-CM

## 2021-07-25 NOTE — Telephone Encounter (Signed)
Pt was seen today by Donna Christen, [place].

## 2021-07-29 ENCOUNTER — Other Ambulatory Visit: Payer: Self-pay

## 2021-07-29 ENCOUNTER — Ambulatory Visit (INDEPENDENT_AMBULATORY_CARE_PROVIDER_SITE_OTHER): Payer: 59 | Admitting: Physician Assistant

## 2021-07-29 ENCOUNTER — Telehealth: Payer: Self-pay | Admitting: Plastic Surgery

## 2021-07-29 ENCOUNTER — Encounter: Payer: Self-pay | Admitting: Plastic Surgery

## 2021-07-29 DIAGNOSIS — Z9889 Other specified postprocedural states: Secondary | ICD-10-CM

## 2021-07-29 MED ORDER — DIAZEPAM 2 MG PO TABS: 2.0000 mg | ORAL_TABLET | Freq: Two times a day (BID) | ORAL | 0 refills | Status: DC | PRN

## 2021-07-29 NOTE — Progress Notes (Addendum)
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   Patient was last seen here in clinic on 07/25/2021.  At that time, she felt largely improved since initiating antibiotics.  She continued to have 40 to 60 cc/day chocolate colored milky drainage from right-sided JP drain.  Exam was largely reassuring.  Right breast was without any tenderness or redness.  Drainage was not malodorous.  There was purulence at the insertion site.  Drain was secured with tape as it appeared to be working its way out.  Left-sided JP drain was removed without complication.  She then called the office complaining of bloody colored drainage rather than the previous chocolate milky purulent drainage.  She presents for evaluation.  Patient continues to have complications from her right-sided drain.  She complained of significant discomfort at the insertion site, but denies any significant breast swelling or redness.  She states that this morning it started bleeding considerably, likely irritation from the tube of the drain.  She denies any fevers or systemic symptoms, but is concerned given the ongoing drainage particular considering pressure she is receiving with regard to radiation treatment.  She meets with them tomorrow.  They are becoming increasingly concerned that given the ongoing purulence and infection that she would need expander washout +/- replacement.  Physical exam shows dark pinkish milky discharge in JP drain.  Continued 40 to 60 cc/day drain output.  Breast is nontender, nonerythematous.  Irritation at insertion site.  JP drain continues to try to pull away.  Dr. Marla Roe also evaluated patient and provide counsel.  Plan is for her to go to her RT appointment tomorrow to obtain further  clarification about when she needs to initiate treatment.  We also asked that she decide whether or not she would like to proceed with expander replacement or closure.  Given discomfort and anxiety, will provide Valium refill.  Plan for phone call with patient tomorrow afternoon.  Stable for continued outpatient management in interim.  Will prescribe a 10-day course of doxycyline.

## 2021-07-29 NOTE — Telephone Encounter (Signed)
PLEASE FOLLOW UP  Patients spouse called wondering if Dr. Marla Roe or Dr. Donna Christen could give the patient a call (or someone from clinical) about her infection. They have a couple questions.  He said "that it has changed from being a brownish color back to a blood color. JP drains have been filling with more of a thicker blood"  If someone could just call her and answer a few questions for them.

## 2021-07-29 NOTE — Telephone Encounter (Signed)
Spoke with patient, she will come in today for evaluation.

## 2021-07-29 NOTE — Addendum Note (Signed)
Addended by: Krista Blue on: 07/29/2021 04:32 PM   Modules accepted: Orders

## 2021-07-30 ENCOUNTER — Ambulatory Visit
Admission: RE | Admit: 2021-07-30 | Discharge: 2021-07-30 | Disposition: A | Discharge status: Home / Self Care | Payer: 59 | Source: Ambulatory Visit | Attending: Radiation Oncology | Admitting: Radiation Oncology

## 2021-07-30 DIAGNOSIS — C50412 Malignant neoplasm of upper-outer quadrant of left female breast: Secondary | ICD-10-CM | POA: Insufficient documentation

## 2021-07-30 MED ORDER — DOXYCYCLINE HYCLATE 100 MG PO TABS: 100.0000 mg | ORAL_TABLET | Freq: Two times a day (BID) | ORAL | 0 refills | Status: DC

## 2021-07-30 NOTE — Addendum Note (Signed)
Addended by: Krista Blue on: 07/30/2021 01:44 PM   Modules accepted: Orders

## 2021-07-31 ENCOUNTER — Other Ambulatory Visit: Payer: Self-pay | Admitting: Surgical

## 2021-07-31 ENCOUNTER — Encounter: Payer: Self-pay | Admitting: Physician Assistant

## 2021-07-31 MED ORDER — HYDROCODONE-ACETAMINOPHEN 5-325 MG PO TABS: 1.0000 | ORAL_TABLET | Freq: Four times a day (QID) | ORAL | 0 refills | Status: AC | PRN

## 2021-07-31 NOTE — Progress Notes (Signed)
Post op pain meds, discussed with Krista Blue PA-C

## 2021-08-04 ENCOUNTER — Other Ambulatory Visit: Payer: Self-pay

## 2021-08-04 ENCOUNTER — Ambulatory Visit (INDEPENDENT_AMBULATORY_CARE_PROVIDER_SITE_OTHER): Payer: 59 | Admitting: Plastic Surgery

## 2021-08-04 ENCOUNTER — Encounter: Payer: Self-pay | Admitting: Plastic Surgery

## 2021-08-04 ENCOUNTER — Other Ambulatory Visit: Payer: Self-pay | Admitting: Physician Assistant

## 2021-08-04 DIAGNOSIS — C50919 Malignant neoplasm of unspecified site of unspecified female breast: Secondary | ICD-10-CM

## 2021-08-04 NOTE — Progress Notes (Signed)
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  Patient's left-sided drain was removed a couple weeks ago and her right-sided drain was left in place due to infection.  It came out spontaneously over the weekend and she was seen yesterday in clinic.  At that time, exam was largely reassuring.  70 cc was added to left expander, 0 added to the right expander given recent infection.  Plan was for return for her scheduled appointment today for possible repeat fill depending on how patient felt.   Right: 0 cc for a total of 200 / 535 cc Left: 70 cc for a total of 270 / 535 cc  Today, she is accompanied by her husband at bedside.  She is doing well.  She states that she does not have any significant discomfort from her left breast expansion yesterday.  She states that the plan to her understanding is to get the left side expanded as much as possible before she begins targeted left breast radiation therapy 08/18/2021.  Given recent right breast infection and ongoing doxycycline antibiotics, will continue to hold off on right side expansion and effort to mitigate risk of recurrent infection.    Physical exam is entirely reassuring.  Right breast mildly swollen, but no redness or significant subcutaneous fluid collections appreciated.  Gauze is removed from JP drain insertion site which is without any purulent drainage or surrounding erythema.  Left breast does have what appears to be unstageable scab over her left breast.  Honeycomb had been removed yesterday.  Breast is not on tender to palpation, and optically swollen.  No overlying erythema.  We placed injectable saline in the Expander using a sterile technique: Right: 0 cc for a total of 200 / 535 cc Left: 50 cc for a total of 320 /  535 cc  Plan is for close follow-up in 1 week for further evaluation and likely repeat expander fill.

## 2021-08-04 NOTE — Progress Notes (Signed)
   Subjective:    Patient ID: Vickie Mathis, female    DOB: 26-Jun-1991, 30 y.o.   MRN: [id number]  The patient is a 30 year old female here with her husband for follow-up.  She called because her drain came out.  Today there is no redness and no sign of seroma.  She is feeling okay.  A little bit sore on the right but in proportion to her surgery.     Review of Systems  Constitutional: Negative.   Eyes: Negative.   Respiratory: Negative.    Cardiovascular: Negative.   Gastrointestinal: Negative.   Endocrine: Negative.   Genitourinary: Negative.       Objective:   Physical Exam Nursing note reviewed.  Constitutional:      Appearance: Normal appearance.  Cardiovascular:     Rate and Rhythm: Normal rate.     Pulses: Normal pulses.  Neurological:     Mental Status: She is alert and oriented to person, place, and time.  Psychiatric:        Mood and Affect: Mood normal.        Behavior: Behavior normal.        Thought Content: Thought content normal.       Assessment & Plan:     ICD-10-CM   1. Invasive carcinoma of breast ([place])  C50.919       See how she feels.  If she is up for more fill tomorrow, then otherwise switch and come in on Friday.  Patient agrees with plan. We placed injectable saline in the Expander using a sterile technique: Right: 0 cc for a total of 200 / 535 cc Left: 70 cc for a total of 270 / 535 cc

## 2021-08-05 ENCOUNTER — Ambulatory Visit (INDEPENDENT_AMBULATORY_CARE_PROVIDER_SITE_OTHER): Payer: 59 | Admitting: Physician Assistant

## 2021-08-05 DIAGNOSIS — Z9889 Other specified postprocedural states: Secondary | ICD-10-CM

## 2021-08-06 ENCOUNTER — Inpatient Hospital Stay: Payer: 59

## 2021-08-06 ENCOUNTER — Other Ambulatory Visit: Payer: Self-pay

## 2021-08-06 ENCOUNTER — Inpatient Hospital Stay: Payer: 59 | Attending: Oncology

## 2021-08-06 ENCOUNTER — Inpatient Hospital Stay (HOSPITAL_BASED_OUTPATIENT_CLINIC_OR_DEPARTMENT_OTHER): Payer: 59 | Admitting: Oncology

## 2021-08-06 ENCOUNTER — Encounter: Payer: Self-pay | Admitting: Oncology

## 2021-08-06 VITALS — BP 101/59 | HR 82 | Temp 98.8°F | Resp 16 | Wt 195.1 lb

## 2021-08-06 DIAGNOSIS — C50412 Malignant neoplasm of upper-outer quadrant of left female breast: Secondary | ICD-10-CM | POA: Insufficient documentation

## 2021-08-06 DIAGNOSIS — C50919 Malignant neoplasm of unspecified site of unspecified female breast: Secondary | ICD-10-CM

## 2021-08-06 DIAGNOSIS — Z95828 Presence of other vascular implants and grafts: Secondary | ICD-10-CM

## 2021-08-06 DIAGNOSIS — Z5111 Encounter for antineoplastic chemotherapy: Secondary | ICD-10-CM | POA: Insufficient documentation

## 2021-08-06 DIAGNOSIS — Z5112 Encounter for antineoplastic immunotherapy: Secondary | ICD-10-CM

## 2021-08-06 DIAGNOSIS — Z171 Estrogen receptor negative status [ER-]: Secondary | ICD-10-CM | POA: Diagnosis not present

## 2021-08-06 LAB — CBC WITH DIFFERENTIAL/PLATELET
Abs Immature Granulocytes: 0.08 10*3/uL — ABNORMAL HIGH (ref 0.00–0.07)
Basophils Absolute: 0.1 10*3/uL (ref 0.0–0.1)
Basophils Relative: 1 %
Eosinophils Absolute: 0 10*3/uL (ref 0.0–0.5)
Eosinophils Relative: 1 %
HCT: 30.9 % — ABNORMAL LOW (ref 36.0–46.0)
Hemoglobin: 9.8 g/dL — ABNORMAL LOW (ref 12.0–15.0)
Immature Granulocytes: 1 %
Lymphocytes Relative: 13 %
Lymphs Abs: 1.2 10*3/uL (ref 0.7–4.0)
MCH: 29.2 pg (ref 26.0–34.0)
MCHC: 31.7 g/dL (ref 30.0–36.0)
MCV: 92 fL (ref 80.0–100.0)
Monocytes Absolute: 0.9 10*3/uL (ref 0.1–1.0)
Monocytes Relative: 10 %
Neutro Abs: 6.5 10*3/uL (ref 1.7–7.7)
Neutrophils Relative %: 74 %
Platelets: 396 10*3/uL (ref 150–400)
RBC: 3.36 MIL/uL — ABNORMAL LOW (ref 3.87–5.11)
RDW: 14.3 % (ref 11.5–15.5)
WBC: 8.7 10*3/uL (ref 4.0–10.5)
nRBC: 0 % (ref 0.0–0.2)

## 2021-08-06 LAB — COMPREHENSIVE METABOLIC PANEL
ALT: 18 U/L (ref 0–44)
AST: 18 U/L (ref 15–41)
Albumin: 3.8 g/dL (ref 3.5–5.0)
Alkaline Phosphatase: 97 U/L (ref 38–126)
Anion gap: 9 (ref 5–15)
BUN: 12 mg/dL (ref 6–20)
CO2: 26 mmol/L (ref 22–32)
Calcium: 9.3 mg/dL (ref 8.9–10.3)
Chloride: 102 mmol/L (ref 98–111)
Creatinine, Ser: 0.68 mg/dL (ref 0.44–1.00)
GFR, Estimated: >60 mL/min (ref >60)
Glucose, Bld: 105 mg/dL — ABNORMAL HIGH (ref 70–99)
Potassium: 3.8 mmol/L (ref 3.5–5.1)
Sodium: 137 mmol/L (ref 135–145)
Total Bilirubin: 0.3 mg/dL (ref 0.3–1.2)
Total Protein: 7.3 g/dL (ref 6.5–8.1)

## 2021-08-06 LAB — TSH: TSH: 0.846 u[IU]/mL (ref 0.350–4.500)

## 2021-08-06 MED ORDER — HEPARIN SOD (PORK) LOCK FLUSH 100 UNIT/ML IV SOLN
500.0000 [IU] | Freq: Once | INTRAVENOUS | Status: AC
  Administered 2021-08-06: 500 [IU] via INTRAVENOUS
  Filled 2021-08-06: qty 5

## 2021-08-06 MED ORDER — PROCHLORPERAZINE EDISYLATE 10 MG/2ML IJ SOLN
10.0000 mg | Freq: Once | INTRAMUSCULAR | Status: AC
  Administered 2021-08-06: 10 mg via INTRAVENOUS
  Filled 2021-08-06: qty 2

## 2021-08-06 MED ORDER — SODIUM CHLORIDE 0.9 % IV SOLN
INTRAVENOUS | Status: DC
  Filled 2021-08-06: qty 250

## 2021-08-06 MED ORDER — SODIUM CHLORIDE 0.9% FLUSH
10.0000 mL | Freq: Once | INTRAVENOUS | Status: AC
Start: 2021-08-06 — End: 2021-08-06
  Administered 2021-08-06: 10 mL via INTRAVENOUS
  Filled 2021-08-06: qty 10

## 2021-08-06 MED ORDER — SODIUM CHLORIDE 0.9 % IV SOLN
200.0000 mg | Freq: Once | INTRAVENOUS | Status: AC
  Administered 2021-08-06: 200 mg via INTRAVENOUS
  Filled 2021-08-06: qty 8

## 2021-08-06 NOTE — Progress Notes (Signed)
Hematology/Oncology Consult note [facility]  [phone] [phone]  Patient Care Team: Juluis Pitch, MD as PCP - General (Family Medicine) Sindy Guadeloupe, MD as Consulting Physician (Hematology and Oncology)   Name of the patient: Vickie Mathis  [id number]  June 05, 1991   Date of visit: 08/06/21  Diagnosis-  locally advanced 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 breast cancer at least T2 N1 M0    Chief complaint/ Reason for visit-on treatment assessment prior to cycle 1 of adjuvant Keytruda  Heme/Onc history: patient is a 30 year old female who self palpated a left breast mass about 10 months ago and since then it has been slowly growing.  She sought medical attention recently after thinking it was a possible cyst for all this while.She underwent a diagnostic bilateral mammogram and ultrasound which showed a 2.7 cm mass at the 12 o'clock position 6 cm from the nipple.  Ultrasound of the left axilla demonstrates 2 lymph nodes with mild thickened cortices of 4 mm.  There is skin thickening on the lower inner quadrant of the left breast on mammography.  Both the breast mass and the lymph node were biopsied and was positive for invasive mammary carcinoma grade 3.  Lymph node was also suspicious for extracapsular extension.    ER/PR and HER-2 negative   MRI showed irregular enhancing mass at the 12 o'clock position of the left breast measuring 2.5 x 2.3 cm and a linear component extending 2.3 cm anteriorly.  The mass in the anterior linear extension combined measure 4.3 cm.  3.9 cm in the cephalocaudal dimension.  Also an area of clumped non-mass enhancement in the posterior aspect of the lower quadrant of the left breast measuring 2.6 x 0.9 cm which looks suspicious.  2 enlarged left axillary lymph nodes and 2 mildly enlarged internal mammary lymph nodes.  4.3 cm clumped area of non-mass enhancement in the outer quadrant of the right breast.  3 right axillary lymph  nodes with mild cortical thickening.   Patient underwent biopsy of the right breast non-mass enhancement and that was negative for malignancy   CT scan showed mildly enlarged right inguinal lymph nodes nonspecific.  Left upper breast mass with prominent left axillary lymph nodes.  Subcentimeter pulmonary nodules which are calcified and compatible with benign old granulomatous disease.  0.6 5.4 cm lucent lesion in the left first rib possibly a hemangioma or benign lesion.   Patient developed significant infusion reaction to carboplatin with dose 9 when her blood pressure dropped and she became tachycardic and significantly nauseous.  She went on to complete [facility] Keytruda chemotherapy as per keynote 522 regimen.  Patient underwent bilateral mastectomy with reconstruction in September 2022.  Final pathology showed complete pathological response2 sentinel lymph nodes negative for malignancy.  No malignancy noted in the right breast.  Interval history-right breast drain fell off over the weekend and patient has been in touch with plastic surgery office.  She does have on and off drainage from the drain site.  Denies any fever.  She has 2 more days of doxycycline left.  She is not using much of her oxycodone.  ECOG PS- 1 Pain scale- 0 Opioid associated constipation- no  Review of systems- Review of Systems  Constitutional:  Positive for malaise/fatigue. Negative for chills, fever and weight loss.  HENT:  Negative for congestion, ear discharge and nosebleeds.   Eyes:  Negative for blurred vision.  Respiratory:  Negative for cough, hemoptysis, sputum production, shortness of breath and wheezing.  Cardiovascular:  Negative for chest pain, palpitations, orthopnea and claudication.  Gastrointestinal:  Negative for abdominal pain, blood in stool, constipation, diarrhea, heartburn, melena, nausea and vomiting.  Genitourinary:  Negative for dysuria, flank pain, frequency, hematuria and urgency.   Musculoskeletal:  Negative for back pain, joint pain and myalgias.  Skin:  Negative for rash.  Neurological:  Negative for dizziness, tingling, focal weakness, seizures, weakness and headaches.  Endo/Heme/Allergies:  Does not bruise/bleed easily.  Psychiatric/Behavioral:  Negative for depression and suicidal ideas. The patient does not have insomnia.       Allergies  Allergen Reactions   Carboplatin Shortness Of Breath, Nausea And Vomiting and Other (See Comments)    Flushing- chest , face, neck , arm including hand   Amoxicillin     Other reaction(s): "too young to remember what they do to me"   Sulfa Antibiotics     Other reaction(s): "too young to remember what they do to me"     Past Medical History:  Diagnosis Date   Anxiety    Asthma    Breast cancer ([place]) 11/2020   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 breast ca   Depression    Family history of cancer    History of chemotherapy    Varicose veins of bilateral lower extremities with pain      Past Surgical History:  Procedure Laterality Date   APPENDECTOMY  2018   BILATERAL TOTAL MASTECTOMY WITH AXILLARY LYMPH NODE DISSECTION Bilateral 06/25/2021   Procedure: BILATERAL TOTAL MASTECTOMY WITH LEFT AXILLARY LYMPH NODE BIOPSY VS. AXILLARY NODE DISSECTION;  Surgeon: Herbert Pun, MD;  Location: [facility] ORS;  Service: General;  Laterality: Bilateral;  [place], 1.5 hours Cintron-Diaz 2.5 hours   BREAST BIOPSY Left 11/26/2020   vision 12:00 6cmfn [facility]   BREAST BIOPSY Left 11/26/2020   LN bx, hydro marker,  fragments of macrometastatic carcinoma   BREAST RECONSTRUCTION WITH PLACEMENT OF TISSUE EXPANDER AND FLEX HD (ACELLULAR HYDRATED DERMIS) Bilateral 06/25/2021   Procedure: BREAST RECONSTRUCTION WITH PLACEMENT OF TISSUE EXPANDER AND FLEX HD (ACELLULAR HYDRATED DERMIS);  Surgeon: Wallace Going, DO;  Location: [facility] ORS;  Service: Plastics;  Laterality: Bilateral;   PORTACATH PLACEMENT Right 12/13/2020   Procedure: INSERTION  PORT-A-CATH;  Surgeon: Herbert Pun, MD;  Location: [facility] ORS;  Service: General;  Laterality: Right;    Social History   Socioeconomic History   Marital status: Married    Spouse name: Not on file   Number of children: Not on file   Years of education: Not on file   Highest education level: Not on file  Occupational History   Not on file  Tobacco Use   Smoking status: Never   Smokeless tobacco: Never  Vaping Use   Vaping Use: Never used  Substance and Sexual Activity   Alcohol use: Not Currently    Comment: occassinally    Drug use: Yes    Types: Marijuana    Comment: occ   Sexual activity: Yes  Other Topics Concern   Not on file  Social History Narrative   Not on file   Social Determinants of Health   Financial Resource Strain: Not on file  Food Insecurity: Not on file  Transportation Needs: Not on file  Physical Activity: Not on file  Stress: Not on file  Social Connections: Not on file  Intimate Partner Violence: Not on file    Family History  Problem Relation Age of Onset   Diabetes Father    Varicose Veins Father    Diabetes  Paternal Aunt    Uterine cancer Paternal Aunt        precancerous   Diabetes Paternal Grandmother    Uterine cancer Paternal Grandmother        precancerous   Thyroid disease Mother    Thyroid disease Maternal Aunt    Thyroid disease Maternal Grandmother    Cancer Other        stomach vs ovarian/cervical   Cancer Paternal Great-grandmother        unk     Current Outpatient Medications:    busPIRone (BUSPAR) 15 MG tablet, Take 15 mg by mouth 2 (two) times daily., Disp: , Rfl:    citalopram (CELEXA) 40 MG tablet, Take 1 tablet (40 mg total) by mouth daily. (Patient taking differently: Take 40 mg by mouth at bedtime.), Disp: 90 tablet, Rfl: 2   clonazePAM (KLONOPIN) 0.5 MG tablet, Take 1 tablet (0.5 mg total) by mouth 3 (three) times daily as needed., Disp: 60 tablet, Rfl: 0   Cyanocobalamin (B-12 PO), Take 1 tablet by  mouth daily., Disp: , Rfl:    diazepam (VALIUM) 2 MG tablet, Take 1 tablet (2 mg total) by mouth every 12 (twelve) hours as needed for muscle spasms., Disp: 20 tablet, Rfl: 0   docusate sodium (COLACE) 100 MG capsule, Take 100 mg by mouth daily as needed for mild constipation., Disp: , Rfl:    doxycycline (VIBRA-TABS) 100 MG tablet, Take 1 tablet (100 mg total) by mouth 2 (two) times daily., Disp: 20 tablet, Rfl: 0   folic acid (FOLVITE) 1 MG tablet, TAKE 2 TABLETS BY MOUTH EVERY DAY, Disp: 180 tablet, Rfl: 0   gabapentin (NEURONTIN) 300 MG capsule, Take 1 capsule (300 mg total) by mouth 3 (three) times daily for 14 days., Disp: 42 capsule, Rfl: 0   ibuprofen (ADVIL) 200 MG tablet, Take 400 mg by mouth every 6 (six) hours as needed for moderate pain., Disp: , Rfl:    lidocaine-prilocaine (EMLA) cream, Apply 1 application topically daily as needed (prior to port access)., Disp: 30 g, Rfl: 3   methocarbamol (ROBAXIN) 500 MG tablet, Take 500 mg by mouth 3 (three) times daily as needed for muscle spasms., Disp: , Rfl:    Multiple Vitamin (MULTIVITAMIN WITH MINERALS) TABS tablet, Take 1 tablet by mouth daily., Disp: , Rfl:    ondansetron (ZOFRAN) 8 MG tablet, Take 1 tablet (8 mg total) by mouth every 8 (eight) hours as needed for refractory nausea / vomiting. Start on day 3 after chemo., Disp: 60 tablet, Rfl: 1   prochlorperazine (COMPAZINE) 10 MG tablet, Take 1 tablet (10 mg total) by mouth every 6 (six) hours as needed (Nausea or vomiting)., Disp: 30 tablet, Rfl: 1   triamcinolone ointment (KENALOG) 0.5 %, Apply 1 application topically 2 (two) times daily., Disp: 30 g, Rfl: 0   cyclobenzaprine (FLEXERIL) 5 MG tablet, Take 1 tablet (5 mg total) by mouth 3 (three) times daily as needed for muscle spasms. (Patient not taking: Reported on 08/06/2021), Disp: 30 tablet, Rfl: 0   OLANZapine (ZYPREXA) 10 MG tablet, TAKE 1 TABLET BY MOUTH EVERYDAY AT BEDTIME (Patient not taking: Reported on 08/06/2021), Disp: 90  tablet, Rfl: 1   ondansetron (ZOFRAN ODT) 4 MG disintegrating tablet, Take 1 tablet (4 mg total) by mouth every 8 (eight) hours as needed for nausea or vomiting. (Patient not taking: Reported on 08/06/2021), Disp: 20 tablet, Rfl: 0   potassium chloride SA (KLOR-CON) 20 MEQ tablet, Take 1 tablet (20 mEq total) by  mouth daily. (Patient not taking: Reported on 08/06/2021), Disp: 3 tablet, Rfl: 0   TURMERIC CURCUMIN PO, Take 2 capsules by mouth daily. (Patient not taking: Reported on 08/06/2021), Disp: , Rfl:  No current facility-administered medications for this visit.  Facility-Administered Medications Ordered in Other Visits:    0.9 %  sodium chloride infusion, , Intravenous, Continuous, Sindy Guadeloupe, MD, Stopped at 08/06/21 1207   prochlorperazine (COMPAZINE) tablet 10 mg, 10 mg, Oral, Q6H PRN, Sindy Guadeloupe, MD, 10 mg at 07/16/21 1407  Physical exam:  Vitals:   08/06/21 0926  BP: (!) 101/59  Pulse: 82  Resp: 16  Temp: 98.8 F (37.1 C)  Weight: 195 lb 1.6 oz (88.5 kg)   Physical Exam Constitutional:      General: She is not in acute distress. Cardiovascular:     Rate and Rhythm: Normal rate and regular rhythm.     Heart sounds: Normal heart sounds.  Pulmonary:     Effort: Pulmonary effort is normal.     Breath sounds: Normal breath sounds.  Abdominal:     General: Bowel sounds are normal.     Palpations: Abdomen is soft.  Skin:    General: Skin is warm and dry.  Neurological:     Mental Status: She is alert and oriented to person, place, and time.     CMP Latest Ref Rng & Units 08/06/2021  Glucose 70 - 99 mg/dL 105(H)  BUN 6 - 20 mg/dL 12  Creatinine 0.44 - 1.00 mg/dL 0.68  Sodium 135 - 145 mmol/L 137  Potassium 3.5 - 5.1 mmol/L 3.8  Chloride 98 - 111 mmol/L 102  CO2 22 - 32 mmol/L 26  Calcium 8.9 - 10.3 mg/dL 9.3  Total Protein 6.5 - 8.1 g/dL 7.3  Total Bilirubin 0.3 - 1.2 mg/dL 0.3  Alkaline Phos 38 - 126 U/L 97  AST 15 - 41 U/L 18  ALT 0 - 44 U/L 18   CBC Latest  Ref Rng & Units 08/06/2021  WBC 4.0 - 10.5 K/uL 8.7  Hemoglobin 12.0 - 15.0 g/dL 9.8(L)  Hematocrit 36.0 - 46.0 % 30.9(L)  Platelets 150 - 400 K/uL 396      Assessment and plan- Patient is a 30 y.o. female with stage IIIb triple negative breast cancer of the left breast cT2 N1 M0.  She is s/p neoadjuvant chemotherapy as per keynote 522 protocol followed by complete pathological response.  She is here for on treatment assessment prior to cycle 1 of adjuvant Keytruda  Counts okay to proceed with cycle 1 of adjuvant Keytruda today.  I will see her back in 3 weeks for cycle 2.  Plan is to complete 9 adjuvant treatments.  Patient will also be starting adjuvant radiation treatment in 1 week's time.  Right breast drain has fallen off and there was some intermittent serous discharge noted from the drain site.  Overall the area does not appear infected and patient will be completing her doxycycline course in the next 2 days.  She is also been in touch with plastic surgery office.   Visit Diagnosis 1. Malignant neoplasm of upper-outer quadrant of left breast in female, estrogen receptor negative ([place])   2. Encounter for antineoplastic immunotherapy      Dr. Randa Evens, MD, MPH [facility] [phone] 08/06/2021 12:34 PM

## 2021-08-06 NOTE — Progress Notes (Signed)
Nutrition Assessment   Reason for Assessment:   Referral from MD   ASSESSMENT:  30 year old female with 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 breast cancer.  Patient receiving [place]. S/p bilateral mastectomy and reconstruction.   Met with patient during infusion.  Patient reports that appetite is not that good.  Denies really issues with nausea just no appetite.  Says that she has been drinking a protein shake sometimes in the morning (thinks it is boost).  Last evening ate chicken and rice. Thinks she drank a shake yesterday.  Stated to RD that she feels "hot, just not right"   Medications: reviewed   Labs: reviewed   Anthropometrics:   Height: 69 inches Weight: 195 lb 205 lb on 5/17 BMI: 28  5% weight loss in the last 6 months, not significant   NUTRITION DIAGNOSIS: Inadequate oral intake related to cancer related treatment side effects as evidenced by 5% weight loss in 6 months and no appetite   INTERVENTION:  RD notified RN regarding patient's symptoms.   RD provided patient with contact information.   Will touch base at another visit when patient is feeling better.    MONITORING, EVALUATION, GOAL: weight trends, intake   Next Visit: to be determined  Vickie Mathis B. Zenia Resides, [place], [place] Registered Dietitian [phone] (mobile)

## 2021-08-06 NOTE — Progress Notes (Signed)
Patient currently on antibiotic prescribed by surgeon for infection.  Decrease in appetite with 4 lb wt loss and is scheduled to see nutritionist today.

## 2021-08-06 NOTE — Patient Instructions (Signed)
C[facility] CENTER [place] [facility]  Discharge Instructions: Thank you for choosing [facility] to provide your oncology and hematology care.  If you have a lab appointment with the Cancer Center, please go directly to the Cancer Center and check in at the registration area.  Wear comfortable clothing and clothing appropriate for easy access to any Portacath or PICC line.   We strive to give you quality time with your provider. You may need to reschedule your appointment if you arrive late (15 or more minutes).  Arriving late affects you and other patients whose appointments are after yours.  Also, if you miss three or more appointments without notifying the office, you may be dismissed from the clinic at the provider's discretion.      For prescription refill requests, have your pharmacy contact our office and allow 72 hours for refills to be completed.    Today you received the following chemotherapy and/or immunotherapy agents KEYTRUDA      To help prevent nausea and vomiting after your treatment, we encourage you to take your nausea medication as directed.  BELOW ARE SYMPTOMS THAT SHOULD BE REPORTED IMMEDIATELY: *FEVER GREATER THAN 100.4 F (38 C) OR HIGHER *CHILLS OR SWEATING *NAUSEA AND VOMITING THAT IS NOT CONTROLLED WITH YOUR NAUSEA MEDICATION *UNUSUAL SHORTNESS OF BREATH *UNUSUAL BRUISING OR BLEEDING *URINARY PROBLEMS (pain or burning when urinating, or frequent urination) *BOWEL PROBLEMS (unusual diarrhea, constipation, pain near the anus) TENDERNESS IN MOUTH AND THROAT WITH OR WITHOUT PRESENCE OF ULCERS (sore throat, sores in mouth, or a toothache) UNUSUAL RASH, SWELLING OR PAIN  UNUSUAL VAGINAL DISCHARGE OR ITCHING   Items with * indicate a potential emergency and should be followed up as soon as possible or go to the Emergency Department if any problems should occur.  Please show the CHEMOTHERAPY ALERT CARD or IMMUNOTHERAPY ALERT CARD at check-in to  the Emergency Department and triage nurse.  Should you have questions after your visit or need to cancel or reschedule your appointment, please contact [facility]  [phone] and follow the prompts.  Office hours are 8:00 a.m. to 4:30 p.m. Monday - Friday. Please note that voicemails left after 4:00 p.m. may not be returned until the following business day.  We are closed weekends and major holidays. You have access to a nurse at all times for urgent questions. Please call the main number to the clinic [phone] and follow the prompts.  For any non-urgent questions, you may also contact your provider using MyChart. We now offer e-Visits for anyone 18 and older to request care online for non-urgent symptoms. For details visit [email or web address].   Also download the MyChart app! Go to the app store, search "MyChart", open the app, select , and log in with your MyChart username and password.  Due to Covid, a mask is required upon entering the hospital/clinic. If you do not have a mask, one will be given to you upon arrival. For doctor visits, patients may have 1 support person aged 18 or older with them. For treatment visits, patients cannot have anyone with them due to current Covid guidelines and our immunocompromised population.   Pembrolizumab injection What is this medication? PEMBROLIZUMAB (pem broe liz ue mab) is a monoclonal antibody. It is used to treat certain types of cancer. This medicine may be used for other purposes; ask your health care provider or pharmacist if you have questions. COMMON BRAND NAME(S): Keytruda What should I tell my care team   before I take this medication? They need to know if you have any of these conditions: autoimmune diseases like Crohn's disease, ulcerative colitis, or lupus have had or planning to have an allogeneic stem cell transplant (uses someone else's stem cells) history of organ  transplant history of chest radiation nervous system problems like myasthenia gravis or Guillain-Barre syndrome an unusual or allergic reaction to pembrolizumab, other medicines, foods, dyes, or preservatives pregnant or trying to get pregnant breast-feeding How should I use this medication? This medicine is for infusion into a vein. It is given by a health care professional in a hospital or clinic setting. A special MedGuide will be given to you before each treatment. Be sure to read this information carefully each time. Talk to your pediatrician regarding the use of this medicine in children. While this drug may be prescribed for children as young as 6 months for selected conditions, precautions do apply. Overdosage: If you think you have taken too much of this medicine contact a poison control center or emergency room at once. NOTE: This medicine is only for you. Do not share this medicine with others. What if I miss a dose? It is important not to miss your dose. Call your doctor or health care professional if you are unable to keep an appointment. What may interact with this medication? Interactions have not been studied. This list may not describe all possible interactions. Give your health care provider a list of all the medicines, herbs, non-prescription drugs, or dietary supplements you use. Also tell them if you smoke, drink alcohol, or use illegal drugs. Some items may interact with your medicine. What should I watch for while using this medication? Your condition will be monitored carefully while you are receiving this medicine. You may need blood work done while you are taking this medicine. Do not become pregnant while taking this medicine or for 4 months after stopping it. Women should inform their doctor if they wish to become pregnant or think they might be pregnant. There is a potential for serious side effects to an unborn child. Talk to your health care professional or  pharmacist for more information. Do not breast-feed an infant while taking this medicine or for 4 months after the last dose. What side effects may I notice from receiving this medication? Side effects that you should report to your doctor or health care professional as soon as possible: allergic reactions like skin rash, itching or hives, swelling of the face, lips, or tongue bloody or black, tarry breathing problems changes in vision chest pain chills confusion constipation cough diarrhea dizziness or feeling faint or lightheaded fast or irregular heartbeat fever flushing joint pain low blood counts - this medicine may decrease the number of white blood cells, red blood cells and platelets. You may be at increased risk for infections and bleeding. muscle pain muscle weakness pain, tingling, numbness in the hands or feet persistent headache redness, blistering, peeling or loosening of the skin, including inside the mouth signs and symptoms of high blood sugar such as dizziness; dry mouth; dry skin; fruity breath; nausea; stomach pain; increased hunger or thirst; increased urination signs and symptoms of kidney injury like trouble passing urine or change in the amount of urine signs and symptoms of liver injury like dark urine, light-colored stools, loss of appetite, nausea, right upper belly pain, yellowing of the eyes or skin sweating swollen lymph nodes weight loss Side effects that usually do not require medical attention (report to your doctor or   health care professional if they continue or are bothersome): decreased appetite hair loss tiredness This list may not describe all possible side effects. Call your doctor for medical advice about side effects. You may report side effects to FDA at 1-800-FDA-1088. Where should I keep my medication? This drug is given in a hospital or clinic and will not be stored at home. NOTE: This sheet is a summary. It may not cover all possible  information. If you have questions about this medicine, talk to your doctor, pharmacist, or health care provider.  2022 Elsevier/Gold Standard (2019-08-23 21:44:53)  

## 2021-08-06 NOTE — Progress Notes (Signed)
Pt got 10 mg IV Compazine at 1947. Pushed slow by this RN.  At 1059 pt c/o feeling hot, "Off" and "just not right"."   BP 104/64 HR 76 (BP was 101/59 HR 82 prior).  DR Rogue Bussing made aware. No changes in this treatment plan.  Wait until pt feels better and then start Kaytruda. Will continue to monitor pt.  1119-pt feels better and up to bathroom without issues

## 2021-08-08 ENCOUNTER — Other Ambulatory Visit: Payer: Self-pay | Admitting: Physician Assistant

## 2021-08-11 ENCOUNTER — Other Ambulatory Visit: Payer: Self-pay | Admitting: Oncology

## 2021-08-11 ENCOUNTER — Encounter: Payer: Self-pay | Admitting: Oncology

## 2021-08-11 MED ORDER — CLONAZEPAM 0.5 MG PO TABS: 0.5000 mg | ORAL_TABLET | Freq: Three times a day (TID) | ORAL | 0 refills | Status: DC | PRN

## 2021-08-11 NOTE — Progress Notes (Signed)
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  Patient was last seen here in clinic 08/05/2021.  At that time, her exam was reassuring.  She reported that plan was for left-sided breast expansion is much as possible before she begins targeted left breast radiation therapy 08/18/2021.  Decided to hold off on right breast expansion on that day given recent infection and ongoing doxycycline antibiotics, but hoped to begin expansion at subsequent visit.   At conclusion of encounter: Right: 0 cc for a total of 200 / 535 cc Left: 50 cc for a total of 320 / 535 cc  Today, patient is doing well.  She has an area of eschar over the left breast, but has been reassured by her surgeon that it will simply slough off shortly.  She states that her right breast drainage is now a clearish yellow as opposed to the previous thick and milky character.  She is hoping that she can have expansion on each side today given that she is due for targeted radiation therapy beginning 08/18/2021 at 1 PM.  Denies any recent fevers, chills, redness, streaking, or worsening pain symptoms.  Physical exam is reassuring.  She does have the 2 x 3 cm area of eschar over left breast.  No surrounding skin changes.  No wound dehiscence involving incision sites bilaterally.  Right JP drain insertion site with continued mild drainage, clearish yellow.  No areas of subcutaneous fluid collection.    Discussed with surgeon and will proceed with bilateral expander fill today given her improvement.  We placed injectable saline in the Expander using a sterile technique: Right: 70 cc for a total of 270/535 cc Left: 50 cc for a total of 320/53 cc  She also tells me that she has been having difficulty sleeping due to combination  of stress and having to sleep on her back.  Inquired about possible trazodone prescription.  Will prescribe her a 14-day course of hydroxyzine 25 mg to take at night to help with anxiety and sleep to see if that helps.  Picture(s) obtained of the patient and placed in the chart were with the patient's or guardian's permission.

## 2021-08-12 ENCOUNTER — Other Ambulatory Visit: Payer: Self-pay

## 2021-08-12 ENCOUNTER — Ambulatory Visit (INDEPENDENT_AMBULATORY_CARE_PROVIDER_SITE_OTHER): Payer: 59 | Admitting: Physician Assistant

## 2021-08-12 ENCOUNTER — Encounter: Payer: Self-pay | Admitting: Oncology

## 2021-08-12 DIAGNOSIS — Z9889 Other specified postprocedural states: Secondary | ICD-10-CM

## 2021-08-12 MED ORDER — HYDROXYZINE PAMOATE 25 MG PO CAPS: 25.0000 mg | ORAL_CAPSULE | Freq: Every evening | ORAL | 0 refills | Status: AC | PRN

## 2021-08-13 DIAGNOSIS — C50412 Malignant neoplasm of upper-outer quadrant of left female breast: Secondary | ICD-10-CM | POA: Insufficient documentation

## 2021-08-18 ENCOUNTER — Ambulatory Visit: Admission: RE | Admit: 2021-08-18 | Payer: 59 | Source: Ambulatory Visit

## 2021-08-18 ENCOUNTER — Ambulatory Visit (INDEPENDENT_AMBULATORY_CARE_PROVIDER_SITE_OTHER): Payer: 59 | Admitting: Physician Assistant

## 2021-08-18 ENCOUNTER — Inpatient Hospital Stay: Payer: 59

## 2021-08-18 ENCOUNTER — Other Ambulatory Visit: Payer: Self-pay

## 2021-08-18 DIAGNOSIS — Z9889 Other specified postprocedural states: Secondary | ICD-10-CM

## 2021-08-18 NOTE — Progress Notes (Signed)
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   Patient was last seen in the clinic on 08/12/2021.  At that time, she was noted to have a 2 x 3 cm area of eschar over left breast.  No surrounding skin changes.  Exam was otherwise unremarkable.  Injectable saline was placed in expanders bilaterally. Right: 70 cc for a total of 270/535 cc Left: 50 cc for a total of 320/535 cc She expressed that she would ideally have another expansion before initiation of targeted radiation therapy beginning 08/18/2021 at 1 PM.  Today, patient presented accompanied by spouse.  She states that she is largely been doing well.  No changes with the overlying eschar left breast.  She states that she has had mild change in character of drainage from right breast tube insertion site, but does report that it is becoming less frequent.  Denies any redness, pain, fevers, chills, streaking, or other symptoms.  We placed injectable saline in the Expander using a sterile technique: Right: 50 cc for a total of 320 / 535 cc Left: 0 cc for a total of 320 / 535 cc  Area of eschar was debrided at bedside using sterile technique.  ACell powder and she was placed followed by Adaptic, K-Y jelly, gauze, and secured with bordered Mepilex dressing.  Instructions provided to patient for wound care.  K-Y jelly and fresh gauze 1-2 times daily.  Change Adaptic dressing in 2 to 3 days.  Return to clinic for reevaluation and possible additional ACell powder later this week.

## 2021-08-19 ENCOUNTER — Inpatient Hospital Stay: Payer: 59

## 2021-08-19 ENCOUNTER — Ambulatory Visit: Payer: 59

## 2021-08-20 ENCOUNTER — Inpatient Hospital Stay: Payer: 59

## 2021-08-20 ENCOUNTER — Ambulatory Visit: Payer: 59

## 2021-08-21 ENCOUNTER — Ambulatory Visit: Payer: 59 | Admitting: Surgical

## 2021-08-21 ENCOUNTER — Inpatient Hospital Stay: Payer: 59

## 2021-08-21 ENCOUNTER — Ambulatory Visit: Payer: 59

## 2021-08-22 ENCOUNTER — Ambulatory Visit: Payer: 59

## 2021-08-22 ENCOUNTER — Inpatient Hospital Stay: Payer: 59

## 2021-08-22 ENCOUNTER — Other Ambulatory Visit: Payer: Self-pay | Admitting: *Deleted

## 2021-08-22 NOTE — Progress Notes (Signed)
Called pt and asked her if she has had covid vaccine recently and the last time she had one was 2021. I spoke to Janese Banks and she states that pt will need scan in dec. I will order it and let her know when it will be and she can have flu shot when she wants it. She can have covid vaccine only after having scan. In the past covid vaccine was altering results from scans and we do not need possible problems. Pt agreeable and she needs a node for work because she has exhausted her FMLA. I will work on it and let her know.

## 2021-08-23 NOTE — Telephone Encounter (Signed)
I called the patient on Friday evening and told her that Dr. Janese Banks wants her to have a ct scan in month of dec. . Because of this scan she does not want her to have covid vaccine until after the tests because there has been false results when having the covid vaccine on scans if pt's has had the vaccine in 4-6 weeks time line of scans. She can get flu shot now but hold off on covid vaccine til after scan. Scan is being scheduled and we will let her know when it has been scheduled. She is agreeable with the plan

## 2021-08-25 ENCOUNTER — Inpatient Hospital Stay: Payer: 59

## 2021-08-25 ENCOUNTER — Other Ambulatory Visit: Payer: Self-pay

## 2021-08-25 ENCOUNTER — Ambulatory Visit (INDEPENDENT_AMBULATORY_CARE_PROVIDER_SITE_OTHER): Payer: 59 | Admitting: Physician Assistant

## 2021-08-25 ENCOUNTER — Telehealth: Payer: Self-pay

## 2021-08-25 ENCOUNTER — Ambulatory Visit: Payer: 59

## 2021-08-25 DIAGNOSIS — Z9889 Other specified postprocedural states: Secondary | ICD-10-CM

## 2021-08-25 NOTE — Progress Notes (Signed)
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  Patient has approximately 2 x 3 cm area of eschar over left breast that was debrided here in clinic at last encounter 08/18/2021.  ACell powder was placed followed by Adaptic, K-Y jelly, gauze, and secured with bordered Mepilex dressing.  Plan was for continued wound care.  50 cc was placed in right expander.  Expanders have 320/535 cc each side.  Plan was to proceed with additional expander injections cautiously given presence of wound.  Today, patient reports that she has been dressing her wound, has directed.  She states that she feels as though it appears slightly bigger since her debridement.  She denies any significant drainage, redness, or pain at wound site.  She does endorse moderate amount of drainage from tube insertion site at right breast.  No changes in character from prior visit, remains yellowish drainage.  Denies any associated redness.  Physical exam right breast is reassuring.  The insertion site from which she is drainage does not appear to be particularly irritated.  Recommending Vaseline gauze.  Right breast without any significant erythema or obvious subcutaneous fluid collection.  Left breast wound is approximately 4 x 4.5 x 0.5 cm.  Good granular base noted.  No surrounding cellulitic changes.  Used donated ACell powder followed by Adaptic, K-Y jelly, 4 x 4 gauze, and secured with bordered Mepilex dressing.  Will complete [employer] form so that she can have ongoing wound care supplies at home.  Recommending changing the Adaptic every other day.  K-Y jelly and fresh gauze twice daily.  She can return to clinic in 7 to 10 days for ongoing evaluation and management.  Radiation therapy is holding off on treatment until  her wound improves.  Will abstain from expander fill today given patient's exam.  Picture(s) obtained of the patient and placed in the chart were with the patient's or guardian's permission.

## 2021-08-25 NOTE — Telephone Encounter (Signed)
Faxed order for supplies to [employer]: 6x6 mepilex-change twice a week. Adaptic 3x3 every other day. Surgilube and 4x4 gauze twice day.

## 2021-08-26 ENCOUNTER — Ambulatory Visit: Payer: 59

## 2021-08-26 ENCOUNTER — Inpatient Hospital Stay: Payer: 59

## 2021-08-27 ENCOUNTER — Inpatient Hospital Stay (HOSPITAL_BASED_OUTPATIENT_CLINIC_OR_DEPARTMENT_OTHER): Payer: 59 | Admitting: Oncology

## 2021-08-27 ENCOUNTER — Inpatient Hospital Stay: Payer: 59

## 2021-08-27 ENCOUNTER — Other Ambulatory Visit: Payer: Self-pay

## 2021-08-27 ENCOUNTER — Ambulatory Visit: Payer: 59

## 2021-08-27 ENCOUNTER — Encounter: Payer: Self-pay | Admitting: Oncology

## 2021-08-27 ENCOUNTER — Other Ambulatory Visit: Payer: Self-pay | Admitting: *Deleted

## 2021-08-27 VITALS — BP 102/62 | HR 88 | Temp 98.0°F | Resp 18 | Wt 198.6 lb

## 2021-08-27 DIAGNOSIS — C50919 Malignant neoplasm of unspecified site of unspecified female breast: Secondary | ICD-10-CM

## 2021-08-27 DIAGNOSIS — Z5111 Encounter for antineoplastic chemotherapy: Secondary | ICD-10-CM | POA: Diagnosis not present

## 2021-08-27 DIAGNOSIS — Z171 Estrogen receptor negative status [ER-]: Secondary | ICD-10-CM

## 2021-08-27 DIAGNOSIS — C50412 Malignant neoplasm of upper-outer quadrant of left female breast: Secondary | ICD-10-CM

## 2021-08-27 DIAGNOSIS — Z5112 Encounter for antineoplastic immunotherapy: Secondary | ICD-10-CM

## 2021-08-27 DIAGNOSIS — Z95828 Presence of other vascular implants and grafts: Secondary | ICD-10-CM

## 2021-08-27 LAB — CBC WITH DIFFERENTIAL/PLATELET
Abs Immature Granulocytes: 0.02 10*3/uL (ref 0.00–0.07)
Basophils Absolute: 0 10*3/uL (ref 0.0–0.1)
Basophils Relative: 1 %
Eosinophils Absolute: 0.6 10*3/uL — ABNORMAL HIGH (ref 0.0–0.5)
Eosinophils Relative: 9 %
HCT: 30.3 % — ABNORMAL LOW (ref 36.0–46.0)
Hemoglobin: 10.1 g/dL — ABNORMAL LOW (ref 12.0–15.0)
Immature Granulocytes: 0 %
Lymphocytes Relative: 17 %
Lymphs Abs: 1.1 10*3/uL (ref 0.7–4.0)
MCH: 29.6 pg (ref 26.0–34.0)
MCHC: 33.3 g/dL (ref 30.0–36.0)
MCV: 88.9 fL (ref 80.0–100.0)
Monocytes Absolute: 0.8 10*3/uL (ref 0.1–1.0)
Monocytes Relative: 12 %
Neutro Abs: 4 10*3/uL (ref 1.7–7.7)
Neutrophils Relative %: 61 %
Platelets: 289 10*3/uL (ref 150–400)
RBC: 3.41 MIL/uL — ABNORMAL LOW (ref 3.87–5.11)
RDW: 14.8 % (ref 11.5–15.5)
WBC: 6.5 10*3/uL (ref 4.0–10.5)
nRBC: 0 % (ref 0.0–0.2)

## 2021-08-27 LAB — COMPREHENSIVE METABOLIC PANEL
ALT: 22 U/L (ref 0–44)
AST: 21 U/L (ref 15–41)
Albumin: 3.9 g/dL (ref 3.5–5.0)
Alkaline Phosphatase: 102 U/L (ref 38–126)
Anion gap: 8 (ref 5–15)
BUN: 14 mg/dL (ref 6–20)
CO2: 26 mmol/L (ref 22–32)
Calcium: 8.8 mg/dL — ABNORMAL LOW (ref 8.9–10.3)
Chloride: 100 mmol/L (ref 98–111)
Creatinine, Ser: 0.85 mg/dL (ref 0.44–1.00)
GFR, Estimated: >60 mL/min (ref >60)
Glucose, Bld: 97 mg/dL (ref 70–99)
Potassium: 4.2 mmol/L (ref 3.5–5.1)
Sodium: 134 mmol/L — ABNORMAL LOW (ref 135–145)
Total Bilirubin: 0.5 mg/dL (ref 0.3–1.2)
Total Protein: 6.9 g/dL (ref 6.5–8.1)

## 2021-08-27 MED ORDER — SODIUM CHLORIDE 0.9 % IV SOLN
200.0000 mg | Freq: Once | INTRAVENOUS | Status: AC
  Administered 2021-08-27: 200 mg via INTRAVENOUS
  Filled 2021-08-27: qty 8

## 2021-08-27 MED ORDER — HEPARIN SOD (PORK) LOCK FLUSH 100 UNIT/ML IV SOLN
500.0000 [IU] | Freq: Once | INTRAVENOUS | Status: AC
  Administered 2021-08-27: 500 [IU] via INTRAVENOUS
  Filled 2021-08-27: qty 5

## 2021-08-27 MED ORDER — SODIUM CHLORIDE 0.9 % IV SOLN
INTRAVENOUS | Status: DC | PRN
  Filled 2021-08-27: qty 250

## 2021-08-27 MED ORDER — PROCHLORPERAZINE EDISYLATE 10 MG/2ML IJ SOLN: 10.0000 mg | Freq: Once | INTRAMUSCULAR | Status: DC

## 2021-08-27 NOTE — Progress Notes (Signed)
Hematology/Oncology Consult note [facility]  Telephone:([phone] [phone]  Patient Care Team: Juluis Pitch, MD as PCP - General (Family Medicine) Sindy Guadeloupe, MD as Consulting Physician (Hematology and Oncology)   Name of the patient: Vickie Mathis  [id number]  Nov 08, 1990   Date of visit: 08/27/21  Diagnosis- locally advanced 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 breast cancer at least T2 N1 M0  Chief complaint/ Reason for visit-on treatment assessment prior to cycle 2 of adjuvant Keytruda  Heme/Onc history: patient is a 30 year old female who self palpated a left breast mass about 10 months ago and since then it has been slowly growing.  She sought medical attention recently after thinking it was a possible cyst for all this while.She underwent a diagnostic bilateral mammogram and ultrasound which showed a 2.7 cm mass at the 12 o'clock position 6 cm from the nipple.  Ultrasound of the left axilla demonstrates 2 lymph nodes with mild thickened cortices of 4 mm.  There is skin thickening on the lower inner quadrant of the left breast on mammography.  Both the breast mass and the lymph node were biopsied and was positive for invasive mammary carcinoma grade 3.  Lymph node was also suspicious for extracapsular extension.    ER/PR and HER-2 negative   MRI showed irregular enhancing mass at the 12 o'clock position of the left breast measuring 2.5 x 2.3 cm and a linear component extending 2.3 cm anteriorly.  The mass in the anterior linear extension combined measure 4.3 cm.  3.9 cm in the cephalocaudal dimension.  Also an area of clumped non-mass enhancement in the posterior aspect of the lower quadrant of the left breast measuring 2.6 x 0.9 cm which looks suspicious.  2 enlarged left axillary lymph nodes and 2 mildly enlarged internal mammary lymph nodes.  4.3 cm clumped area of non-mass enhancement in the outer quadrant of the right breast.  3 right axillary lymph  nodes with mild cortical thickening.   Patient underwent biopsy of the right breast non-mass enhancement and that was negative for malignancy   CT scan showed mildly enlarged right inguinal lymph nodes nonspecific.  Left upper breast mass with prominent left axillary lymph nodes.  Subcentimeter pulmonary nodules which are calcified and compatible with benign old granulomatous disease.  0.6 5.4 cm lucent lesion in the left first rib possibly a hemangioma or benign lesion.   Patient developed significant infusion reaction to carboplatin with dose 9 when her blood pressure dropped and she became tachycardic and significantly nauseous.  She went on to complete [facility] Keytruda chemotherapy as per keynote 522 regimen.  Patient underwent bilateral mastectomy with reconstruction in September 2022.  Final pathology showed complete pathological response2 sentinel lymph nodes negative for malignancy.  No malignancy noted in the right breast.    Interval history-she still has ongoing fatigue but feels a little better with each passing day.  She has an open 3 cm oval ulcer on her left chest wall which is healing with secondary intention.  She still has intermittent draining from her prior breast drains which are no longer in place  ECOG PS- 1 Pain scale- 3 Opioid associated constipation- no  Review of systems- Review of Systems  Constitutional:  Positive for malaise/fatigue. Negative for chills, fever and weight loss.  HENT:  Negative for congestion, ear discharge and nosebleeds.   Eyes:  Negative for blurred vision.  Respiratory:  Negative for cough, hemoptysis, sputum production, shortness of breath and wheezing.   Cardiovascular:  Negative for chest pain, palpitations, orthopnea and claudication.  Gastrointestinal:  Negative for abdominal pain, blood in stool, constipation, diarrhea, heartburn, melena, nausea and vomiting.  Genitourinary:  Negative for dysuria, flank pain, frequency, hematuria and urgency.   Musculoskeletal:  Negative for back pain, joint pain and myalgias.  Skin:  Negative for rash.  Neurological:  Negative for dizziness, tingling, focal weakness, seizures, weakness and headaches.  Endo/Heme/Allergies:  Does not bruise/bleed easily.  Psychiatric/Behavioral:  Negative for depression and suicidal ideas. The patient does not have insomnia.       Allergies  Allergen Reactions   Carboplatin Shortness Of Breath, Nausea And Vomiting and Other (See Comments)    Flushing- chest , face, neck , arm including hand   Amoxicillin     Other reaction(s): "too young to remember what they do to me"   Sulfa Antibiotics     Other reaction(s): "too young to remember what they do to me"     Past Medical History:  Diagnosis Date   Anxiety    Asthma    Breast cancer ([place]) 11/2020   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 breast ca   Depression    Family history of cancer    History of chemotherapy    Varicose veins of bilateral lower extremities with pain      Past Surgical History:  Procedure Laterality Date   APPENDECTOMY  2018   BILATERAL TOTAL MASTECTOMY WITH AXILLARY LYMPH NODE DISSECTION Bilateral 06/25/2021   Procedure: BILATERAL TOTAL MASTECTOMY WITH LEFT AXILLARY LYMPH NODE BIOPSY VS. AXILLARY NODE DISSECTION;  Surgeon: Herbert Pun, MD;  Location: [facility] ORS;  Service: General;  Laterality: Bilateral;  [place], 1.5 hours Cintron-Diaz 2.5 hours   BREAST BIOPSY Left 11/26/2020   vision 12:00 6cmfn [facility]   BREAST BIOPSY Left 11/26/2020   LN bx, hydro marker,  fragments of macrometastatic carcinoma   BREAST RECONSTRUCTION WITH PLACEMENT OF TISSUE EXPANDER AND FLEX HD (ACELLULAR HYDRATED DERMIS) Bilateral 06/25/2021   Procedure: BREAST RECONSTRUCTION WITH PLACEMENT OF TISSUE EXPANDER AND FLEX HD (ACELLULAR HYDRATED DERMIS);  Surgeon: Wallace Going, DO;  Location: [facility] ORS;  Service: Plastics;  Laterality: Bilateral;   PORTACATH PLACEMENT Right 12/13/2020   Procedure: INSERTION  PORT-A-CATH;  Surgeon: Herbert Pun, MD;  Location: [facility] ORS;  Service: General;  Laterality: Right;    Social History   Socioeconomic History   Marital status: Married    Spouse name: Not on file   Number of children: Not on file   Years of education: Not on file   Highest education level: Not on file  Occupational History   Not on file  Tobacco Use   Smoking status: Never   Smokeless tobacco: Never  Vaping Use   Vaping Use: Never used  Substance and Sexual Activity   Alcohol use: Not Currently    Comment: occassinally    Drug use: Yes    Types: Marijuana    Comment: occ   Sexual activity: Yes  Other Topics Concern   Not on file  Social History Narrative   Not on file   Social Determinants of Health   Financial Resource Strain: Not on file  Food Insecurity: Not on file  Transportation Needs: Not on file  Physical Activity: Not on file  Stress: Not on file  Social Connections: Not on file  Intimate Partner Violence: Not on file    Family History  Problem Relation Age of Onset   Diabetes Father    Varicose Veins Father    Diabetes Paternal Architectural technologist  Uterine cancer Paternal Aunt        precancerous   Diabetes Paternal Grandmother    Uterine cancer Paternal Grandmother        precancerous   Thyroid disease Mother    Thyroid disease Maternal Aunt    Thyroid disease Maternal Grandmother    Cancer Other        stomach vs ovarian/cervical   Cancer Paternal Great-grandmother        unk     Current Outpatient Medications:    busPIRone (BUSPAR) 15 MG tablet, Take 15 mg by mouth 2 (two) times daily., Disp: , Rfl:    citalopram (CELEXA) 40 MG tablet, Take 1 tablet (40 mg total) by mouth daily. (Patient taking differently: Take 40 mg by mouth at bedtime.), Disp: 90 tablet, Rfl: 2   clonazePAM (KLONOPIN) 0.5 MG tablet, Take 1 tablet (0.5 mg total) by mouth 3 (three) times daily as needed., Disp: 60 tablet, Rfl: 0   Cyanocobalamin (B-12 PO), Take 1 tablet by  mouth daily., Disp: , Rfl:    docusate sodium (COLACE) 100 MG capsule, Take 100 mg by mouth daily as needed for mild constipation., Disp: , Rfl:    folic acid (FOLVITE) 1 MG tablet, TAKE 2 TABLETS BY MOUTH EVERY DAY, Disp: 180 tablet, Rfl: 0   hydrOXYzine (VISTARIL) 25 MG capsule, Take 1 capsule (25 mg total) by mouth at bedtime as needed for up to 21 days for anxiety (sleep)., Disp: 21 capsule, Rfl: 0   ibuprofen (ADVIL) 200 MG tablet, Take 400 mg by mouth every 6 (six) hours as needed for moderate pain., Disp: , Rfl:    lidocaine-prilocaine (EMLA) cream, Apply 1 application topically daily as needed (prior to port access)., Disp: 30 g, Rfl: 3   methocarbamol (ROBAXIN) 500 MG tablet, TAKE 1 TABLET BY MOUTH 3 TIMES DAILY FOR 14 DAYS., Disp: 42 tablet, Rfl: 0   Multiple Vitamin (MULTIVITAMIN WITH MINERALS) TABS tablet, Take 1 tablet by mouth daily., Disp: , Rfl:    ondansetron (ZOFRAN ODT) 4 MG disintegrating tablet, Take 1 tablet (4 mg total) by mouth every 8 (eight) hours as needed for nausea or vomiting., Disp: 20 tablet, Rfl: 0   ondansetron (ZOFRAN) 8 MG tablet, Take 1 tablet (8 mg total) by mouth every 8 (eight) hours as needed for refractory nausea / vomiting. Start on day 3 after chemo., Disp: 60 tablet, Rfl: 1   prochlorperazine (COMPAZINE) 10 MG tablet, Take 1 tablet (10 mg total) by mouth every 6 (six) hours as needed (Nausea or vomiting)., Disp: 30 tablet, Rfl: 1   triamcinolone ointment (KENALOG) 0.5 %, Apply 1 application topically 2 (two) times daily., Disp: 30 g, Rfl: 0   TURMERIC CURCUMIN PO, Take 2 capsules by mouth daily., Disp: , Rfl:    diazepam (VALIUM) 2 MG tablet, Take 1 tablet (2 mg total) by mouth every 12 (twelve) hours as needed for muscle spasms. (Patient not taking: Reported on 08/27/2021), Disp: 20 tablet, Rfl: 0   OLANZapine (ZYPREXA) 10 MG tablet, TAKE 1 TABLET BY MOUTH EVERYDAY AT BEDTIME (Patient not taking: Reported on 08/27/2021), Disp: 90 tablet, Rfl: 1   potassium  chloride SA (KLOR-CON) 20 MEQ tablet, Take 1 tablet (20 mEq total) by mouth daily. (Patient not taking: Reported on 08/27/2021), Disp: 3 tablet, Rfl: 0 No current facility-administered medications for this visit.  Facility-Administered Medications Ordered in Other Visits:    0.9 %  sodium chloride infusion, , Intravenous, PRN, Sindy Guadeloupe, MD, Stopped at 08/27/21  1140   prochlorperazine (COMPAZINE) tablet 10 mg, 10 mg, Oral, Q6H PRN, Sindy Guadeloupe, MD, 10 mg at 07/16/21 1407  Physical exam:  Vitals:   08/27/21 1001  BP: 102/62  Pulse: 88  Resp: 18  Temp: 98 F (36.7 C)  SpO2: 98%  Weight: 198 lb 9.6 oz (90.1 kg)   Physical Exam Constitutional:      General: She is not in acute distress.    Comments: Appears fatigued  Cardiovascular:     Rate and Rhythm: Normal rate and regular rhythm.     Heart sounds: Normal heart sounds.  Pulmonary:     Effort: Pulmonary effort is normal.     Breath sounds: Normal breath sounds.  Skin:    General: Skin is warm and dry.  Neurological:     Mental Status: She is alert and oriented to person, place, and time.     CMP Latest Ref Rng & Units 08/27/2021  Glucose 70 - 99 mg/dL 97  BUN 6 - 20 mg/dL 14  Creatinine 0.44 - 1.00 mg/dL 0.85  Sodium 135 - 145 mmol/L 134(L)  Potassium 3.5 - 5.1 mmol/L 4.2  Chloride 98 - 111 mmol/L 100  CO2 22 - 32 mmol/L 26  Calcium 8.9 - 10.3 mg/dL 8.8(L)  Total Protein 6.5 - 8.1 g/dL 6.9  Total Bilirubin 0.3 - 1.2 mg/dL 0.5  Alkaline Phos 38 - 126 U/L 102  AST 15 - 41 U/L 21  ALT 0 - 44 U/L 22   CBC Latest Ref Rng & Units 08/27/2021  WBC 4.0 - 10.5 K/uL 6.5  Hemoglobin 12.0 - 15.0 g/dL 10.1(L)  Hematocrit 36.0 - 46.0 % 30.3(L)  Platelets 150 - 400 K/uL 289    Assessment and plan- Patient is a 30 y.o. female with stage IIIb triple negative breast cancer of the left breast cT2 N1 M0.  She is s/p neoadjuvant chemotherapy as per keynote 522 protocol followed by complete pathological response.  She is here  for on treatment assessment prior to cycle 2 of adjuvant Keytruda  Counts okay to proceed with cycle 2 of adjuvant Keytruda today.  She will be seen by covering NP in 3 weeks for cycle 3 and I will see her in 6 weeks for cycle 4.  Plan is to complete 9 adjuvant cycles.  Patient still has ongoing drainage from the right breast drain site which was removed over 2 weeks ago.  She also has an oval ulceration over her left chest wall which is healing with secondary intention.  I doubt that she would be able to start adjuvant radiation treatment with this ongoing ulcer.  She will be seeing Dr. Donella Stade next week to see as to when she can start radiation treatment.  Chemo induced anemia: Gradually improving.  Continue to monitor  Anxiety and depression: Currently on Klonopin   Visit Diagnosis 1. Malignant neoplasm of upper-outer quadrant of left breast in female, estrogen receptor negative ([place])   2. Encounter for antineoplastic immunotherapy      Dr. Randa Evens, MD, MPH [facility] [phone] 08/27/2021 2:57 PM

## 2021-08-27 NOTE — Telephone Encounter (Signed)
[place], The radiation left early today and I did not get chance to get in touch with [facility]. The Nurse for Dr. Baruch Gouty. One Monday I can send her a message asking to see if you can make picture and attached to patient advice request  and see what they say. I know Chrystal wants to see it but we know we are not getting radiation. I will let you know but on Monday go ahead and take a picture and I attach the pic so we can show him. Thanks.  I called post office and they do not have fax but Marshell Garfinkel was in charge and she said to send her the email and she will get it to the person in charge of this issue.  Thanks sherry

## 2021-08-27 NOTE — Progress Notes (Signed)
Pt wants to keep her plastic surgeon in the loop more; Dr. Audelia Hives. Still feeling sore from surgery; she will like to check with Dr. Donella Stade if tx can be postponed. Will like to discuss worker comp case.

## 2021-08-27 NOTE — Patient Instructions (Addendum)
[facility]  Discharge Instructions: Thank you for choosing [facility] to provide your oncology and hematology care.  If you have a lab appointment with the Cancer Center, please go directly to the Cancer Center and check in at the registration area.  Wear comfortable clothing and clothing appropriate for easy access to any Portacath or PICC line.   We strive to give you quality time with your provider. You may need to reschedule your appointment if you arrive late (15 or more minutes).  Arriving late affects you and other patients whose appointments are after yours.  Also, if you miss three or more appointments without notifying the office, you may be dismissed from the clinic at the provider's discretion.      For prescription refill requests, have your pharmacy contact our office and allow 72 hours for refills to be completed.    Today you received the following chemotherapy and/or immunotherapy agents KEYTRUDA      To help prevent nausea and vomiting after your treatment, we encourage you to take your nausea medication as directed.  BELOW ARE SYMPTOMS THAT SHOULD BE REPORTED IMMEDIATELY: *FEVER GREATER THAN 100.4 F (38 C) OR HIGHER *CHILLS OR SWEATING *NAUSEA AND VOMITING THAT IS NOT CONTROLLED WITH YOUR NAUSEA MEDICATION *UNUSUAL SHORTNESS OF BREATH *UNUSUAL BRUISING OR BLEEDING *URINARY PROBLEMS (pain or burning when urinating, or frequent urination) *BOWEL PROBLEMS (unusual diarrhea, constipation, pain near the anus) TENDERNESS IN MOUTH AND THROAT WITH OR WITHOUT PRESENCE OF ULCERS (sore throat, sores in mouth, or a toothache) UNUSUAL RASH, SWELLING OR PAIN  UNUSUAL VAGINAL DISCHARGE OR ITCHING   Items with * indicate a potential emergency and should be followed up as soon as possible or go to the Emergency Department if any problems should occur.  Please show the CHEMOTHERAPY ALERT CARD or IMMUNOTHERAPY ALERT CARD at check-in to  the Emergency Department and triage nurse.  Should you have questions after your visit or need to cancel or reschedule your appointment, please contact [facility]  [phone] and follow the prompts.  Office hours are 8:00 a.m. to 4:30 p.m. Monday - Friday. Please note that voicemails left after 4:00 p.m. may not be returned until the following business day.  We are closed weekends and major holidays. You have access to a nurse at all times for urgent questions. Please call the main number to the clinic [phone] and follow the prompts.  For any non-urgent questions, you may also contact your provider using MyChart. We now offer e-Visits for anyone 18 and older to request care online for non-urgent symptoms. For details visit [email or web address].   Also download the MyChart app! Go to the app store, search "MyChart", open the app, select [facility], and log in with your MyChart username and password.  Due to Covid, a mask is required upon entering the hospital/clinic. If you do not have a mask, one will be given to you upon arrival. For doctor visits, patients may have 1 support person aged 18 or older with them. For treatment visits, patients cannot have anyone with them due to current Covid guidelines and our immunocompromised population.   Pembrolizumab injection What is this medication? PEMBROLIZUMAB (pem broe liz ue mab) is a monoclonal antibody. It is used to treat certain types of cancer. This medicine may be used for other purposes; ask your health care provider or pharmacist if you have questions. COMMON BRAND NAME(S): Keytruda What should I tell my care team   before I take this medication? They need to know if you have any of these conditions: autoimmune diseases like Crohn's disease, ulcerative colitis, or lupus have had or planning to have an allogeneic stem cell transplant (uses someone else's stem cells) history of organ  transplant history of chest radiation nervous system problems like myasthenia gravis or Guillain-Barre syndrome an unusual or allergic reaction to pembrolizumab, other medicines, foods, dyes, or preservatives pregnant or trying to get pregnant breast-feeding How should I use this medication? This medicine is for infusion into a vein. It is given by a health care professional in a hospital or clinic setting. A special MedGuide will be given to you before each treatment. Be sure to read this information carefully each time. Talk to your pediatrician regarding the use of this medicine in children. While this drug may be prescribed for children as young as 6 months for selected conditions, precautions do apply. Overdosage: If you think you have taken too much of this medicine contact a poison control center or emergency room at once. NOTE: This medicine is only for you. Do not share this medicine with others. What if I miss a dose? It is important not to miss your dose. Call your doctor or health care professional if you are unable to keep an appointment. What may interact with this medication? Interactions have not been studied. This list may not describe all possible interactions. Give your health care provider a list of all the medicines, herbs, non-prescription drugs, or dietary supplements you use. Also tell them if you smoke, drink alcohol, or use illegal drugs. Some items may interact with your medicine. What should I watch for while using this medication? Your condition will be monitored carefully while you are receiving this medicine. You may need blood work done while you are taking this medicine. Do not become pregnant while taking this medicine or for 4 months after stopping it. Women should inform their doctor if they wish to become pregnant or think they might be pregnant. There is a potential for serious side effects to an unborn child. Talk to your health care professional or  pharmacist for more information. Do not breast-feed an infant while taking this medicine or for 4 months after the last dose. What side effects may I notice from receiving this medication? Side effects that you should report to your doctor or health care professional as soon as possible: allergic reactions like skin rash, itching or hives, swelling of the face, lips, or tongue bloody or black, tarry breathing problems changes in vision chest pain chills confusion constipation cough diarrhea dizziness or feeling faint or lightheaded fast or irregular heartbeat fever flushing joint pain low blood counts - this medicine may decrease the number of white blood cells, red blood cells and platelets. You may be at increased risk for infections and bleeding. muscle pain muscle weakness pain, tingling, numbness in the hands or feet persistent headache redness, blistering, peeling or loosening of the skin, including inside the mouth signs and symptoms of high blood sugar such as dizziness; dry mouth; dry skin; fruity breath; nausea; stomach pain; increased hunger or thirst; increased urination signs and symptoms of kidney injury like trouble passing urine or change in the amount of urine signs and symptoms of liver injury like dark urine, light-colored stools, loss of appetite, nausea, right upper belly pain, yellowing of the eyes or skin sweating swollen lymph nodes weight loss Side effects that usually do not require medical attention (report to your doctor or   health care professional if they continue or are bothersome): decreased appetite hair loss tiredness This list may not describe all possible side effects. Call your doctor for medical advice about side effects. You may report side effects to FDA at 1-800-FDA-1088. Where should I keep my medication? This drug is given in a hospital or clinic and will not be stored at home. NOTE: This sheet is a summary. It may not cover all possible  information. If you have questions about this medicine, talk to your doctor, pharmacist, or health care provider.  2022 Elsevier/Gold Standard (2021-06-10 00:00:00)  

## 2021-08-30 ENCOUNTER — Other Ambulatory Visit: Payer: Self-pay | Admitting: Oncology

## 2021-09-01 ENCOUNTER — Ambulatory Visit: Payer: 59 | Admitting: Radiation Oncology

## 2021-09-01 ENCOUNTER — Inpatient Hospital Stay: Payer: 59

## 2021-09-01 ENCOUNTER — Ambulatory Visit: Payer: 59

## 2021-09-02 ENCOUNTER — Other Ambulatory Visit: Payer: Self-pay | Admitting: Physician Assistant

## 2021-09-02 ENCOUNTER — Other Ambulatory Visit: Payer: Self-pay | Admitting: Oncology

## 2021-09-02 ENCOUNTER — Ambulatory Visit: Payer: 59

## 2021-09-02 ENCOUNTER — Inpatient Hospital Stay: Payer: 59

## 2021-09-03 ENCOUNTER — Inpatient Hospital Stay: Payer: 59

## 2021-09-03 ENCOUNTER — Ambulatory Visit: Payer: 59

## 2021-09-03 ENCOUNTER — Ambulatory Visit (INDEPENDENT_AMBULATORY_CARE_PROVIDER_SITE_OTHER): Payer: 59 | Admitting: Physician Assistant

## 2021-09-03 ENCOUNTER — Other Ambulatory Visit: Payer: Self-pay

## 2021-09-03 ENCOUNTER — Telehealth: Payer: Self-pay

## 2021-09-03 DIAGNOSIS — Z9889 Other specified postprocedural states: Secondary | ICD-10-CM

## 2021-09-03 NOTE — Telephone Encounter (Signed)
Faxed [facility]: Collagen dressing, surgilube, 4x4, and medipore tape-change every other day.

## 2021-09-03 NOTE — Progress Notes (Signed)
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  Patient was last seen here in the office on 08/25/2021.  At that time, she complained of continued drainage from tube insertion site right breast.  Left breast has a wound that was approximately 4 x 4.5 cm, no cellulitic changes noted.  Donated ACell powder was used followed by Adaptic, K-Y jelly, 4 x 4 gauze, and secured with bordered Mepilex dressing.  [facility] order sheet was completed and plan was for continued Adaptic dressing changes every other day and K-Y jelly with fresh gauze twice daily.  Plans for RT are currently suspended until wound improves.  Today, patient told that she feels as though her right side is starting to feel like it is "building up" with pressure and that is no longer draining as much.  Patient reports that the pain that she experiences is over the most lateral aspect of her expander/breast.  She asks about possibility of expander migration laterally.  She is frustrated because she has had ongoing issues with her right breast involving waxing and waning swelling, discomfort, and drainage.  While she is concerned about the possibility of infection, she denies any fevers, redness, streaking, or systemic symptoms.  With regard to her left breast, she and her husband have been doing an excellent job adhering to the dressing changes, as directed.  Patient would like a refill for hydroxyzine given that it has helped considerably with her sleeping at night.  Physical exam largely reassuring.  Wound is approximately 3 x 3.25 cm.  Good base of granular tissue.  Appears to be healing well compared to previous encounter.  At this time, feel as though it is not unreasonable to transition to collagen dressing changes every  other day.  We will write new orders to [facility].  The collagen was placed followed by small amount of K-Y jelly, 4 x 4 gauze, and secured with Medipore tape.  There is no evidence of cellulitis around the wound and it appears to have made progress from previous encounter.  However, decision for collagen is because her radiation therapy will be delayed until she is better healed, so want to be as expeditious as possible.  I can appreciate her concern with the lateral bulging of right-sided expander.  However, no obvious subcutaneous fluid collections noted in the breast is nonerythematous.  While there could be a small, lingering infected seroma contributing to her ongoing issues with drainage and discomfort, nothing that I suspect large enough to be amenable to aspiration.  We will continue to abstain from expander fill until she is feeling a bit more improved from a pain standpoint.  Her tube insertion site is mildly raw appearing, but not concerning for cellulitis.    Patient to return to clinic next Friday for follow-up.

## 2021-09-04 ENCOUNTER — Ambulatory Visit: Payer: 59

## 2021-09-04 ENCOUNTER — Inpatient Hospital Stay: Payer: 59

## 2021-09-05 ENCOUNTER — Ambulatory Visit: Payer: 59

## 2021-09-05 ENCOUNTER — Ambulatory Visit: Payer: 59 | Admitting: Physician Assistant

## 2021-09-05 ENCOUNTER — Inpatient Hospital Stay: Payer: 59

## 2021-09-08 ENCOUNTER — Ambulatory Visit: Payer: 59

## 2021-09-08 ENCOUNTER — Inpatient Hospital Stay: Payer: 59

## 2021-09-08 NOTE — Progress Notes (Signed)
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  She was last seen here in clinic on 09/03/2021.  At that time, 3 x 3.25 cm left wound appeared to be healing well, transition to collagen dressing changes every other day followed by K-Y jelly, 4 x 4 gauze, and secured with Medipore tape.  Orders placed to [facility].  RT is delayed until she sees improved wound healing.  She complained of right-sided "fullness" discomfort and endorses waxing and waning swelling and drainage.  Decided to abstain from expander fill until her symptoms improved.    Patient was then seen yesterday after noticing an opening of her right breast incision with visible expander.  She denied any systemic symptoms.  On exam, a 0.5 x 0.5 cm opening was visualized.  Scant nonpurulent appearing drainage.  There was no obvious subcutaneous fluid collection appreciated.  Discussed expander removal and replacement versus primary closure here in the office.  55 cc was aspirated from her tissue expander to relieve the tension on the wound.  After visit, she now has 265/535 cc in the right breast expander, 320/535 cc in left breast expander.  Today, she returns for ongoing discussion of her surgical options given new wound right breast.  Her left breast wound appears to be healing well with the collagen dressing changes.  However, she has a 1 x 1 cm opening over lateral aspect right breast.  No drainage noted, but expanders visualized.  Discussed removal expanders.  Aspirated 150 cc saline from right breast expander here today.  She will at the very least have removal of her right side expander in the next few days, also encouraged her to consider removal of left-sided expander.  She will think about it over the weekend with her  husband.  At this time, she understands that the best course of action is for her to delay reconstruction and focus on healing so that she can proceed with radiation therapy.  She will require RT 5 days/week x 6 weeks.  Left breast wound was treated with donated ACell here in clinic followed by Adaptic, K-Y jelly, gauze, and secured with Medipore tape followed by ABD pad and compression bra.  She will continue with Adaptic and K-Y jelly until after her surgery and then transition back to her collagen dressing changes that she procured through [facility].  We will prescribe her postoperative medications including Norco, Zofran, and Keflex.  Allergies listed to amoxicillin that she had when she was a child, cannot recall specifically what type of reaction.  She was able to tolerate the Keflex without difficulty during initial phase of reconstruction.

## 2021-09-09 ENCOUNTER — Ambulatory Visit: Payer: 59

## 2021-09-09 ENCOUNTER — Inpatient Hospital Stay: Payer: 59

## 2021-09-10 ENCOUNTER — Other Ambulatory Visit: Payer: Self-pay | Admitting: Oncology

## 2021-09-10 ENCOUNTER — Inpatient Hospital Stay: Payer: 59

## 2021-09-10 ENCOUNTER — Ambulatory Visit: Payer: 59

## 2021-09-11 ENCOUNTER — Telehealth: Payer: Self-pay

## 2021-09-11 ENCOUNTER — Telehealth: Payer: Self-pay | Admitting: Plastic Surgery

## 2021-09-11 ENCOUNTER — Ambulatory Visit (INDEPENDENT_AMBULATORY_CARE_PROVIDER_SITE_OTHER): Payer: 59 | Admitting: Surgical

## 2021-09-11 ENCOUNTER — Ambulatory Visit: Payer: 59

## 2021-09-11 ENCOUNTER — Other Ambulatory Visit: Payer: Self-pay

## 2021-09-11 ENCOUNTER — Inpatient Hospital Stay: Payer: 59

## 2021-09-11 DIAGNOSIS — Z9889 Other specified postprocedural states: Secondary | ICD-10-CM

## 2021-09-11 DIAGNOSIS — C50919 Malignant neoplasm of unspecified site of unspecified female breast: Secondary | ICD-10-CM

## 2021-09-11 MED ORDER — TRAMADOL HCL 50 MG PO TABS: 50.0000 mg | ORAL_TABLET | Freq: Three times a day (TID) | ORAL | 0 refills | Status: DC | PRN

## 2021-09-11 MED ORDER — HYDROXYZINE PAMOATE 25 MG PO CAPS: 25.0000 mg | ORAL_CAPSULE | Freq: Three times a day (TID) | ORAL | 0 refills | Status: DC | PRN

## 2021-09-11 NOTE — Progress Notes (Addendum)
Patient is a 30 year old female here for follow-up on her bilateral breast reconstruction.  She underwent bilateral mastectomies and bilateral immediate breast reconstruction and 06/25/2021.  She currently has a left breast wound that she has been treating with collagen dressing changes every other day.  She reports that over the last day or so she has noticed an opening of the right breast and noticed that the expander was visible through the opening.  She is having a little bit of drainage from this area.  She is not having any infectious symptoms.  She is here with her spouse.  Patient reports that she is currently undergoing adjuvant treatment with Keytruda.  She does have a history of chemotherapy as well.  She is discussing with radiation oncology plans for when to start radiation.  She will need radiation over the left breast.  Chaperone present on exam On exam she has a left breast wound just superior to the mastectomy incision.  The wound is approximately 3 x 3.25 cm with new epithelialization noted along the superior edge.  There is no surrounding erythema or cellulitic changes noted.    On exam of the right breast she has an opening that is approximately 0.5 x 0.5 cm.  The expander is exposed and visible.  I was able to express a scant amount of fluid from the opening.  It did not appear purulent.  I do not appreciate any subcutaneous fluid with palpation.  There is no cellulitic changes.  She does have some erythema surrounding the wound opening, this appears more irritated than infected at this point.  Had a long discussion with patient and her spouse in regards to the expander being exposed.  We discussed that now that the expander is exposed, there are a few options moving forward.  The expander can be removed and replaced with a new expander, the wound can be excised in the office and closed through primary intention and the expander can be left in place, or the expander can be removed and  not replaced.  We discussed the pros and cons of each option and they were encouraged to think this over and talk this over over the next day.  They are scheduled for an appointment tomorrow to be evaluated as well by another provider in our office.  Dr. Marla Roe will be present tomorrow and will be able to evaluate patient as well.  All of their questions were answered to their content.  I recommend continuing with collagen dressing changes to the left breast wound.  I recommend Xeroform over the right breast wound.    55 cc was removed from the right breast tissue expander to relieve tension on the right breast wound.  Sterile technique was used.  Patient tolerated this well. Patient currently has 320/535 cc in the left breast tissue expander.  After removal of 55 cc from the right breast tissue expander she now has 265/535 in the right breast tissue expander.  Pictures were taken and placed in the patient's chart with patient's permission.

## 2021-09-11 NOTE — Telephone Encounter (Signed)
Patient called to say that her medications have not been called in today.  One was for sleep, which Krista Blue, PA-C, gave her last time and another medication was prescribed today.  Please call.  *Patient's preferred pharmacy is [facility] [address] in [place].

## 2021-09-11 NOTE — Addendum Note (Signed)
Addended byRoetta Sessions on: 09/11/2021 04:52 PM   Modules accepted: Orders

## 2021-09-11 NOTE — Telephone Encounter (Signed)
Patient called yesterday and stated that her "expander has punctured her skin and has begin leaking". Notified Dr. Nyoka Cowden and we made arrangements for her to come in 12/8 to see Dr. Aris Everts.  Patient complained of discomfort and pain and wanted to see if there was something that could possibly be prescribed for her. Brought to Dr.Green's attention but he stated that they couldn't do that until she is actually seen. Patient called back wondering what Dr. Rolly Salter plan was and if he was going to prescribe something for the discomfort and pain. So, I informed patient that they will not prescribe anything until she is seen in person. Patient seemed disappointed but stated that she would see [place] in the morning.

## 2021-09-12 ENCOUNTER — Encounter (HOSPITAL_BASED_OUTPATIENT_CLINIC_OR_DEPARTMENT_OTHER): Payer: Self-pay | Admitting: Plastic Surgery

## 2021-09-12 ENCOUNTER — Ambulatory Visit: Payer: 59

## 2021-09-12 ENCOUNTER — Inpatient Hospital Stay: Payer: 59

## 2021-09-12 ENCOUNTER — Other Ambulatory Visit: Payer: Self-pay

## 2021-09-12 ENCOUNTER — Ambulatory Visit (INDEPENDENT_AMBULATORY_CARE_PROVIDER_SITE_OTHER): Payer: 59 | Admitting: Physician Assistant

## 2021-09-12 DIAGNOSIS — Z9889 Other specified postprocedural states: Secondary | ICD-10-CM

## 2021-09-12 MED ORDER — CEPHALEXIN 500 MG PO CAPS: 500.0000 mg | ORAL_CAPSULE | Freq: Four times a day (QID) | ORAL | 0 refills | Status: AC

## 2021-09-12 MED ORDER — HYDROCODONE-ACETAMINOPHEN 5-325 MG PO TABS: 1.0000 | ORAL_TABLET | Freq: Four times a day (QID) | ORAL | 0 refills | Status: DC | PRN

## 2021-09-12 MED ORDER — ONDANSETRON 4 MG PO TBDP: 4.0000 mg | ORAL_TABLET | Freq: Three times a day (TID) | ORAL | 0 refills | Status: DC | PRN

## 2021-09-12 NOTE — Telephone Encounter (Signed)
I calle dpharmacy and she picked up med on 12/8

## 2021-09-15 ENCOUNTER — Ambulatory Visit (HOSPITAL_BASED_OUTPATIENT_CLINIC_OR_DEPARTMENT_OTHER): Payer: 59 | Admitting: Anesthesiology

## 2021-09-15 ENCOUNTER — Inpatient Hospital Stay: Payer: 59

## 2021-09-15 ENCOUNTER — Ambulatory Visit (HOSPITAL_BASED_OUTPATIENT_CLINIC_OR_DEPARTMENT_OTHER)
Admission: RE | Admit: 2021-09-15 | Discharge: 2021-09-15 | Disposition: A | Discharge status: Home / Self Care | Payer: 59 | Attending: Plastic Surgery | Admitting: Plastic Surgery

## 2021-09-15 ENCOUNTER — Encounter (HOSPITAL_BASED_OUTPATIENT_CLINIC_OR_DEPARTMENT_OTHER)
Admission: RE | Disposition: A | Discharge status: Home / Self Care | Payer: Self-pay | Source: Home / Self Care | Attending: Plastic Surgery

## 2021-09-15 ENCOUNTER — Encounter (HOSPITAL_BASED_OUTPATIENT_CLINIC_OR_DEPARTMENT_OTHER): Payer: Self-pay | Admitting: Plastic Surgery

## 2021-09-15 ENCOUNTER — Ambulatory Visit: Payer: 59

## 2021-09-15 ENCOUNTER — Other Ambulatory Visit: Payer: Self-pay

## 2021-09-15 DIAGNOSIS — Z45812 Encounter for adjustment or removal of left breast implant: Secondary | ICD-10-CM | POA: Diagnosis present

## 2021-09-15 DIAGNOSIS — Z9013 Acquired absence of bilateral breasts and nipples: Secondary | ICD-10-CM | POA: Diagnosis not present

## 2021-09-15 DIAGNOSIS — Z853 Personal history of malignant neoplasm of breast: Secondary | ICD-10-CM | POA: Insufficient documentation

## 2021-09-15 DIAGNOSIS — F32A Depression, unspecified: Secondary | ICD-10-CM | POA: Insufficient documentation

## 2021-09-15 DIAGNOSIS — Z9221 Personal history of antineoplastic chemotherapy: Secondary | ICD-10-CM | POA: Insufficient documentation

## 2021-09-15 DIAGNOSIS — Z9889 Other specified postprocedural states: Secondary | ICD-10-CM | POA: Diagnosis not present

## 2021-09-15 DIAGNOSIS — J45990 Exercise induced bronchospasm: Secondary | ICD-10-CM | POA: Insufficient documentation

## 2021-09-15 DIAGNOSIS — F419 Anxiety disorder, unspecified: Secondary | ICD-10-CM | POA: Diagnosis not present

## 2021-09-15 DIAGNOSIS — Z45811 Encounter for adjustment or removal of right breast implant: Secondary | ICD-10-CM | POA: Insufficient documentation

## 2021-09-15 DIAGNOSIS — C50919 Malignant neoplasm of unspecified site of unspecified female breast: Secondary | ICD-10-CM

## 2021-09-15 HISTORY — PX: REMOVAL OF BILATERAL TISSUE EXPANDERS WITH PLACEMENT OF BILATERAL BREAST IMPLANTS: SHX6431

## 2021-09-15 LAB — POCT PREGNANCY, URINE: Preg Test, Ur: NEGATIVE

## 2021-09-15 SURGERY — REMOVAL, TISSUE EXPANDER, BREAST, BILATERAL, WITH BILATERAL IMPLANT IMPLANT INSERTION
Anesthesia: General | Site: Breast | Laterality: Bilateral

## 2021-09-15 MED ORDER — OXYCODONE HCL 5 MG PO TABS
5.0000 mg | ORAL_TABLET | Freq: Once | ORAL | Status: AC | PRN
  Administered 2021-09-15: 5 mg via ORAL

## 2021-09-15 MED ORDER — THROMBIN 5000 UNITS EX SOLR
CUTANEOUS | Status: AC
  Filled 2021-09-15: qty 5000

## 2021-09-15 MED ORDER — CIPROFLOXACIN IN D5W 400 MG/200ML IV SOLN
INTRAVENOUS | Status: AC
  Filled 2021-09-15: qty 200

## 2021-09-15 MED ORDER — FENTANYL CITRATE (PF) 100 MCG/2ML IJ SOLN
INTRAMUSCULAR | Status: AC
  Filled 2021-09-15: qty 2

## 2021-09-15 MED ORDER — ACETAMINOPHEN 325 MG PO TABS: 650.0000 mg | ORAL_TABLET | ORAL | Status: DC | PRN

## 2021-09-15 MED ORDER — ACETAMINOPHEN 325 MG RE SUPP: 650.0000 mg | RECTAL | Status: DC | PRN

## 2021-09-15 MED ORDER — SODIUM CHLORIDE 0.9 % IV SOLN
INTRAVENOUS | Status: AC
  Filled 2021-09-15: qty 10

## 2021-09-15 MED ORDER — SODIUM CHLORIDE 0.9% FLUSH: 3.0000 mL | INTRAVENOUS | Status: DC | PRN

## 2021-09-15 MED ORDER — MEPERIDINE HCL 25 MG/ML IJ SOLN: 6.2500 mg | INTRAMUSCULAR | Status: DC | PRN

## 2021-09-15 MED ORDER — HYDROMORPHONE HCL 1 MG/ML IJ SOLN
INTRAMUSCULAR | Status: AC
  Filled 2021-09-15: qty 0.5

## 2021-09-15 MED ORDER — OXYCODONE HCL 5 MG PO TABS
ORAL_TABLET | ORAL | Status: AC
  Filled 2021-09-15: qty 1

## 2021-09-15 MED ORDER — ONDANSETRON HCL 4 MG/2ML IJ SOLN
INTRAMUSCULAR | Status: AC
  Filled 2021-09-15: qty 2

## 2021-09-15 MED ORDER — SODIUM CHLORIDE 0.9 % IV SOLN: INTRAVENOUS | Status: DC | PRN

## 2021-09-15 MED ORDER — THROMBIN 5000 UNITS EX SOLR
INTRAVENOUS | Status: DC | PRN
  Administered 2021-09-15: 5 mL

## 2021-09-15 MED ORDER — MIDAZOLAM HCL 2 MG/2ML IJ SOLN
INTRAMUSCULAR | Status: AC
  Filled 2021-09-15: qty 2

## 2021-09-15 MED ORDER — LIDOCAINE 2% (20 MG/ML) 5 ML SYRINGE
INTRAMUSCULAR | Status: AC
  Filled 2021-09-15: qty 5

## 2021-09-15 MED ORDER — ONDANSETRON HCL 4 MG/2ML IJ SOLN
INTRAMUSCULAR | Status: DC | PRN
  Administered 2021-09-15: 4 mg via INTRAVENOUS

## 2021-09-15 MED ORDER — SODIUM CHLORIDE 0.9% FLUSH: 3.0000 mL | Freq: Two times a day (BID) | INTRAVENOUS | Status: DC

## 2021-09-15 MED ORDER — PROPOFOL 10 MG/ML IV BOLUS
INTRAVENOUS | Status: AC
  Filled 2021-09-15: qty 20

## 2021-09-15 MED ORDER — MIDAZOLAM HCL 5 MG/5ML IJ SOLN
INTRAMUSCULAR | Status: DC | PRN
  Administered 2021-09-15: 2 mg via INTRAVENOUS

## 2021-09-15 MED ORDER — ACETAMINOPHEN 500 MG PO TABS
1000.0000 mg | ORAL_TABLET | Freq: Once | ORAL | Status: AC
  Administered 2021-09-15: 1000 mg via ORAL

## 2021-09-15 MED ORDER — 0.9 % SODIUM CHLORIDE (POUR BTL) OPTIME
TOPICAL | Status: DC | PRN
  Administered 2021-09-15: 1000 mL

## 2021-09-15 MED ORDER — HYDROMORPHONE HCL 1 MG/ML IJ SOLN
0.2500 mg | INTRAMUSCULAR | Status: DC | PRN
  Administered 2021-09-15 (×4): 0.5 mg via INTRAVENOUS

## 2021-09-15 MED ORDER — PROPOFOL 10 MG/ML IV BOLUS
INTRAVENOUS | Status: DC | PRN
  Administered 2021-09-15: 200 mg via INTRAVENOUS

## 2021-09-15 MED ORDER — AMISULPRIDE (ANTIEMETIC) 5 MG/2ML IV SOLN: 10.0000 mg | Freq: Once | INTRAVENOUS | Status: DC | PRN

## 2021-09-15 MED ORDER — DEXAMETHASONE SODIUM PHOSPHATE 4 MG/ML IJ SOLN
INTRAMUSCULAR | Status: DC | PRN
  Administered 2021-09-15: 10 mg via INTRAVENOUS

## 2021-09-15 MED ORDER — DEXAMETHASONE SODIUM PHOSPHATE 10 MG/ML IJ SOLN
INTRAMUSCULAR | Status: AC
  Filled 2021-09-15: qty 1

## 2021-09-15 MED ORDER — ACETAMINOPHEN 500 MG PO TABS
ORAL_TABLET | ORAL | Status: AC
  Filled 2021-09-15: qty 2

## 2021-09-15 MED ORDER — PROMETHAZINE HCL 25 MG/ML IJ SOLN: 6.2500 mg | INTRAMUSCULAR | Status: DC | PRN

## 2021-09-15 MED ORDER — LACTATED RINGERS IV SOLN: INTRAVENOUS | Status: DC

## 2021-09-15 MED ORDER — STERILE WATER FOR IRRIGATION IR SOLN
Status: DC | PRN
  Administered 2021-09-15: 1000 mL

## 2021-09-15 MED ORDER — SODIUM CHLORIDE 0.9 % IV SOLN: 250.0000 mL | INTRAVENOUS | Status: DC | PRN

## 2021-09-15 MED ORDER — CHLORHEXIDINE GLUCONATE CLOTH 2 % EX PADS: 6.0000 | MEDICATED_PAD | Freq: Once | CUTANEOUS | Status: DC

## 2021-09-15 MED ORDER — LIDOCAINE HCL (CARDIAC) PF 100 MG/5ML IV SOSY
PREFILLED_SYRINGE | INTRAVENOUS | Status: DC | PRN
  Administered 2021-09-15: 60 mg via INTRAVENOUS

## 2021-09-15 MED ORDER — LIDOCAINE-EPINEPHRINE 1 %-1:100000 IJ SOLN
INTRAMUSCULAR | Status: DC | PRN
  Administered 2021-09-15: 50 mL

## 2021-09-15 MED ORDER — OXYCODONE HCL 5 MG/5ML PO SOLN: 5.0000 mg | Freq: Once | ORAL | Status: AC | PRN

## 2021-09-15 MED ORDER — FENTANYL CITRATE (PF) 100 MCG/2ML IJ SOLN
INTRAMUSCULAR | Status: DC | PRN
  Administered 2021-09-15: 100 ug via INTRAVENOUS
  Administered 2021-09-15 (×2): 50 ug via INTRAVENOUS

## 2021-09-15 MED ORDER — OXYCODONE HCL 5 MG PO TABS: 5.0000 mg | ORAL_TABLET | ORAL | Status: DC | PRN

## 2021-09-15 MED ORDER — CIPROFLOXACIN IN D5W 400 MG/200ML IV SOLN
400.0000 mg | INTRAVENOUS | Status: AC
  Administered 2021-09-15: 400 mg via INTRAVENOUS

## 2021-09-15 SURGICAL SUPPLY — 75 items
ADH SKN CLS APL DERMABOND .7 (GAUZE/BANDAGES/DRESSINGS) ×2
BAG DECANTER FOR FLEXI CONT (MISCELLANEOUS) ×1 IMPLANT
BINDER BREAST LRG (GAUZE/BANDAGES/DRESSINGS) IMPLANT
BINDER BREAST MEDIUM (GAUZE/BANDAGES/DRESSINGS) IMPLANT
BINDER BREAST XLRG (GAUZE/BANDAGES/DRESSINGS) ×1 IMPLANT
BINDER BREAST XXLRG (GAUZE/BANDAGES/DRESSINGS) IMPLANT
BIOPATCH RED 1 DISK 7.0 (GAUZE/BANDAGES/DRESSINGS) ×2 IMPLANT
BLADE HEX COATED 2.75 (ELECTRODE) ×1 IMPLANT
BLADE SURG 15 STRL LF DISP TIS (BLADE) ×2 IMPLANT
BLADE SURG 15 STRL SS (BLADE) ×4
CANISTER SUCT 1200ML W/VALVE (MISCELLANEOUS) ×3 IMPLANT
COVER BACK TABLE 60X90IN (DRAPES) ×2 IMPLANT
COVER MAYO STAND STRL (DRAPES) ×2 IMPLANT
COVER SURGICAL LIGHT HANDLE (MISCELLANEOUS) IMPLANT
DECANTER SPIKE VIAL GLASS SM (MISCELLANEOUS) IMPLANT
DERMABOND ADVANCED (GAUZE/BANDAGES/DRESSINGS) ×2
DERMABOND ADVANCED .7 DNX12 (GAUZE/BANDAGES/DRESSINGS) IMPLANT
DRAIN CHANNEL 19F RND (DRAIN) ×2 IMPLANT
DRAPE LAPAROSCOPIC ABDOMINAL (DRAPES) ×2 IMPLANT
DRSG OPSITE POSTOP 4X6 (GAUZE/BANDAGES/DRESSINGS) ×4 IMPLANT
DRSG PAD ABDOMINAL 8X10 ST (GAUZE/BANDAGES/DRESSINGS) ×4 IMPLANT
DRSG TEGADERM 2-3/8X2-3/4 SM (GAUZE/BANDAGES/DRESSINGS) ×2 IMPLANT
ELECT BLADE 4.0 EZ CLEAN MEGAD (MISCELLANEOUS) ×2
ELECT REM PT RETURN 9FT ADLT (ELECTROSURGICAL) ×2
ELECTRODE BLDE 4.0 EZ CLN MEGD (MISCELLANEOUS) ×1 IMPLANT
ELECTRODE REM PT RTRN 9FT ADLT (ELECTROSURGICAL) ×1 IMPLANT
EVACUATOR SILICONE 100CC (DRAIN) ×2 IMPLANT
FUNNEL KELLER 2 DISP (MISCELLANEOUS) IMPLANT
GAUZE SPONGE 4X4 12PLY STRL (GAUZE/BANDAGES/DRESSINGS) ×1 IMPLANT
GAUZE SPONGE 4X4 12PLY STRL LF (GAUZE/BANDAGES/DRESSINGS) IMPLANT
GAUZE XEROFORM 1X8 LF (GAUZE/BANDAGES/DRESSINGS) ×1 IMPLANT
GLOVE SURG ENC MOIS LTX SZ6.5 (GLOVE) ×5 IMPLANT
GLOVE SURG ENC MOIS LTX SZ7 (GLOVE) ×1 IMPLANT
GLOVE SURG ENC MOIS LTX SZ7.5 (GLOVE) ×1 IMPLANT
GOWN STRL REUS W/ TWL LRG LVL3 (GOWN DISPOSABLE) ×2 IMPLANT
GOWN STRL REUS W/ TWL XL LVL3 (GOWN DISPOSABLE) IMPLANT
GOWN STRL REUS W/TWL LRG LVL3 (GOWN DISPOSABLE) ×4
GOWN STRL REUS W/TWL XL LVL3 (GOWN DISPOSABLE) ×2
IV NS 1000ML (IV SOLUTION)
IV NS 1000ML BAXH (IV SOLUTION) IMPLANT
IV NS 500ML (IV SOLUTION)
IV NS 500ML BAXH (IV SOLUTION) IMPLANT
KIT FILL SYSTEM UNIVERSAL (SET/KITS/TRAYS/PACK) IMPLANT
NDL HYPO 25X1 1.5 SAFETY (NEEDLE) ×1 IMPLANT
NDL SAFETY ECLIPSE 18X1.5 (NEEDLE) ×1 IMPLANT
NEEDLE HYPO 18GX1.5 SHARP (NEEDLE) ×2
NEEDLE HYPO 25X1 1.5 SAFETY (NEEDLE) ×4 IMPLANT
PACK BASIN DAY SURGERY FS (CUSTOM PROCEDURE TRAY) ×2 IMPLANT
PENCIL SMOKE EVACUATOR (MISCELLANEOUS) ×2 IMPLANT
PIN SAFETY STERILE (MISCELLANEOUS) ×1 IMPLANT
RETRACTOR ONETRAX LX 90X20 (MISCELLANEOUS) ×1 IMPLANT
SLEEVE SCD COMPRESS KNEE MED (STOCKING) ×2 IMPLANT
SPONGE T-LAP 18X18 ~~LOC~~+RFID (SPONGE) ×4 IMPLANT
STRIP SUTURE WOUND CLOSURE 1/2 (MISCELLANEOUS) IMPLANT
SUT MNCRL AB 3-0 PS2 18 (SUTURE) ×2 IMPLANT
SUT MNCRL AB 4-0 PS2 18 (SUTURE) ×4 IMPLANT
SUT MON AB 3-0 SH 27 (SUTURE)
SUT MON AB 3-0 SH27 (SUTURE) ×4 IMPLANT
SUT MON AB 5-0 PS2 18 (SUTURE) ×1 IMPLANT
SUT PDS 3-0 CT2 (SUTURE)
SUT PDS AB 2-0 CT2 27 (SUTURE) IMPLANT
SUT PDS II 3-0 CT2 27 ABS (SUTURE) ×2 IMPLANT
SUT PROLENE 4 0 PS 2 18 (SUTURE) ×1 IMPLANT
SUT SILK 3 0 SH 30 (SUTURE) ×2 IMPLANT
SUT VIC AB 3-0 SH 27 (SUTURE)
SUT VIC AB 3-0 SH 27X BRD (SUTURE) IMPLANT
SUT VICRYL 4-0 PS2 18IN ABS (SUTURE) IMPLANT
SYR 5ML LL (SYRINGE) ×1 IMPLANT
SYR BULB IRRIG 60ML STRL (SYRINGE) ×2 IMPLANT
SYR CONTROL 10ML LL (SYRINGE) ×2 IMPLANT
TOWEL GREEN STERILE FF (TOWEL DISPOSABLE) ×4 IMPLANT
TRAY DSU PREP LF (CUSTOM PROCEDURE TRAY) ×2 IMPLANT
TUBE CONNECTING 20X1/4 (TUBING) ×2 IMPLANT
UNDERPAD 30X36 HEAVY ABSORB (UNDERPADS AND DIAPERS) ×4 IMPLANT
YANKAUER SUCT BULB TIP NO VENT (SUCTIONS) ×2 IMPLANT

## 2021-09-15 NOTE — Op Note (Signed)
DATE OF OPERATION: 09/15/2021  LOCATION: [facility]  PREOPERATIVE DIAGNOSIS: Acquired absence of bilateral breast, history of breast cancer  POSTOPERATIVE DIAGNOSIS: Same  PROCEDURE: Removal of bilateral breast expanders  SURGEON: Ahijah Devery Sanger Shaunta Oncale, DO  ASSISTANT: Dr. Lennice Sites, PA and Krista Blue, PA  EBL: 10 cc  CONDITION: Stable  COMPLICATIONS: None  INDICATION: The patient, Vickie Mathis, is a 30 y.o. female born on 11-12-1990, is here for treatment of acquired absence of bilateral breasts.  The patient underwent bilateral mastectomies.  She had a protracted seroma on the right breast.  She had a small area of skin loss on the left breast from the dye. She got over that and then had exposure of the right breast expander.  This is likely related to the chemotherapeutics.  Patient opted for removal of bilateral expanders.     PROCEDURE DETAILS:  The patient was seen prior to surgery and marked.  The IV antibiotics were given. The patient was taken to the operating room and given a general anesthetic. A standard time out was performed and all information was confirmed by those in the room. SCDs were placed.   The chest was prepped and draped.    Left:  The #10 blade was used to make an incision at the previous incision site.  The expander was removed and the ADM.  The pocket was irrigated with antibiotic solution and saline.  A drain was placed and secured to the skin with the 3-0 Silk.  The deep layer was closed with the 3-0 Monocryl followed by the 4-0 Monocryl.  Derma bond and steri strips were applied.    Right:  The #10 blade was used to make an incision at the previous incision site.  The expander was removed.  The pocket was irrigated with antibiotic solution and saline. The ADM was not visible and likely was dissolved by the seroma. Cultures were obtained prior to the irrigation.  A drain was placed and secured to the skin with the 3-0 Silk.   The  deep layer was closed with the 3-0 Monocryl followed by the 4-0 Monocryl.  Derma bond and steri strips were applied.  The patient was allowed to wake up and taken to recovery room in stable condition at the end of the case. The family was notified at the end of the case.   The advanced practice practitioner (APP) assisted throughout the case.  The APP was essential in retraction and counter traction when needed to make the case progress smoothly.  This retraction and assistance made it possible to see the tissue plans for the procedure.  The assistance was needed for blood control, tissue re-approximation and assisted with closure of the incision site.

## 2021-09-15 NOTE — H&P (Signed)
Vickie Mathis is an 30 y.o. female.   Chief Complaint: History of breast cancer status post bilateral mastectomies HPI: The patient is a 30 year old female here for surgery.  She underwent bilateral mastectomies.  She has been on chemotherapy.  She had some challenges with the right breast access seroma.  This finally resolved and then she had some skin breakdown from the dye on her left breast.  Then over the weekend she noticed thinning of her right breast skin and exposure of the expander.  We had a long conversation about options.  She has opted to remove both expanders so she can move ahead with radiation to the left breast.  This is her safest option and likely her quickest option to radiation.  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 AXILLARY NODE DISSECTION;  Surgeon: Herbert Pun, MD;  Location: [facility] ORS;  Service: General;  Laterality: Bilateral;  Wilfredo Canterbury, 1.5 hours Cintron-Diaz 2.5 hours   BREAST BIOPSY Left 11/26/2020   vision 12:00 6cmfn [facility]   BREAST BIOPSY Left 11/26/2020   LN bx, hydro marker,  fragments of macrometastatic carcinoma   BREAST RECONSTRUCTION WITH PLACEMENT OF TISSUE EXPANDER AND FLEX HD (ACELLULAR HYDRATED DERMIS) Bilateral 06/25/2021   Procedure: BREAST RECONSTRUCTION WITH PLACEMENT OF TISSUE EXPANDER AND FLEX HD (ACELLULAR HYDRATED DERMIS);  Surgeon: Wallace Going, DO;  Location: [facility] ORS;  Service: Plastics;  Laterality: Bilateral;   PORTACATH PLACEMENT Right 12/13/2020   Procedure: INSERTION  PORT-A-CATH;  Surgeon: Herbert Pun, MD;  Location: [facility] ORS;  Service: General;  Laterality: Right;    Family History  Problem Relation Age of Onset   Diabetes Father    Varicose Veins Father    Diabetes Paternal Aunt    Uterine cancer Paternal Aunt        precancerous   Diabetes Paternal Grandmother    Uterine cancer Paternal Grandmother        precancerous   Thyroid disease Mother    Thyroid disease Maternal Aunt    Thyroid disease Maternal Grandmother    Cancer Other        stomach vs ovarian/cervical   Cancer Paternal Great-grandmother        unk   Social History:  reports that she has never smoked. She has never used smokeless tobacco. She reports that she does not currently use alcohol. She reports current drug use. Drug: Marijuana.  Allergies:  Allergies  Allergen Reactions   Carboplatin Shortness Of Breath, Nausea And Vomiting and Other (See Comments)    Flushing- chest , face, neck , arm including hand   Amoxicillin     Other reaction(s): "too young to remember what they do to me"   Sulfa Antibiotics     Other reaction(s): "too young to remember what they do to me"    Medications Prior to Admission  Medication Sig Dispense Refill   busPIRone (BUSPAR) 15 MG tablet Take 15 mg by mouth 2 (two) times daily.     clonazePAM (KLONOPIN) 0.5 MG  tablet TAKE 1 TABLET BY MOUTH 3 TIMES DAILY AS NEEDED. 60 tablet 0   gabapentin (NEURONTIN) 300 MG capsule TAKE 1 CAPSULE (300 MG TOTAL) BY MOUTH 3 (THREE) TIMES DAILY FOR 14 DAYS. 42 capsule 0   methocarbamol (ROBAXIN) 500 MG tablet TAKE 1 TABLET BY MOUTH 3 TIMES DAILY FOR 14 DAYS. 42 tablet 0   OLANZapine (ZYPREXA) 10 MG tablet TAKE 1 TABLET BY MOUTH EVERYDAY AT BEDTIME 90 tablet 1   ondansetron (ZOFRAN-ODT) 4 MG disintegrating tablet Take 1 tablet (4 mg total) by mouth every 8 (eight) hours as needed for nausea or vomiting. 20 tablet 0   cephALEXin (KEFLEX) 500 MG capsule Take 1 capsule (500 mg total) by mouth 4 (four)  times daily for 3 days. 12 capsule 0   citalopram (CELEXA) 40 MG tablet Take 1 tablet (40 mg total) by mouth daily. (Patient taking differently: Take 40 mg by mouth at bedtime.) 90 tablet 2   Cyanocobalamin (B-12 PO) Take 1 tablet by mouth daily.     diazepam (VALIUM) 2 MG tablet Take 1 tablet (2 mg total) by mouth every 12 (twelve) hours as needed for muscle spasms. 20 tablet 0   docusate sodium (COLACE) 100 MG capsule Take 100 mg by mouth daily as needed for mild constipation.     folic acid (FOLVITE) 1 MG tablet TAKE 2 TABLETS BY MOUTH EVERY DAY 180 tablet 0   HYDROcodone-acetaminophen (NORCO) 5-325 MG tablet Take 1 tablet by mouth every 6 (six) hours as needed for up to 5 days for severe pain. 20 tablet 0   hydrOXYzine (VISTARIL) 25 MG capsule Take 1 capsule (25 mg total) by mouth every 8 (eight) hours as needed for up to 10 doses. 10 capsule 0   ibuprofen (ADVIL) 200 MG tablet Take 400 mg by mouth every 6 (six) hours as needed for moderate pain.     lidocaine-prilocaine (EMLA) cream Apply 1 application topically daily as needed (prior to port access). 30 g 3   Multiple Vitamin (MULTIVITAMIN WITH MINERALS) TABS tablet Take 1 tablet by mouth daily.     ondansetron (ZOFRAN ODT) 4 MG disintegrating tablet Take 1 tablet (4 mg total) by mouth every 8 (eight) hours as needed for nausea or vomiting. 20 tablet 0   ondansetron (ZOFRAN) 8 MG tablet Take 1 tablet (8 mg total) by mouth every 8 (eight) hours as needed for refractory nausea / vomiting. Start on day 3 after chemo. 60 tablet 1   potassium chloride SA (KLOR-CON) 20 MEQ tablet Take 1 tablet (20 mEq total) by mouth daily. 3 tablet 0   prochlorperazine (COMPAZINE) 10 MG tablet Take 1 tablet (10 mg total) by mouth every 6 (six) hours as needed (Nausea or vomiting). 30 tablet 1   traMADol (ULTRAM) 50 MG tablet Take 1 tablet (50 mg total) by mouth every 8 (eight) hours as needed for up to 5 doses. 5 tablet 0   triamcinolone ointment (KENALOG) 0.5 %  Apply 1 application topically 2 (two) times daily. 30 g 0   TURMERIC CURCUMIN PO Take 2 capsules by mouth daily.      Results for orders placed or performed during the hospital encounter of 09/15/21 (from the past 48 hour(s))  Pregnancy, urine POC     Status: None   Collection Time: 09/15/21 11:40 AM  Result Value Ref Range   Preg Test, Ur NEGATIVE NEGATIVE    Comment:        THE SENSITIVITY OF THIS METHODOLOGY IS >24 mIU/mL  No results found.  Review of Systems  Constitutional: Negative.   Eyes: Negative.   Respiratory: Negative.    Cardiovascular: Negative.   Gastrointestinal: Negative.   Genitourinary: Negative.   Musculoskeletal: Negative.   Skin:  Positive for wound.  Hematological: Negative.   Psychiatric/Behavioral: Negative.    All other systems reviewed and are negative.  Blood pressure 114/68, pulse 89, temperature 98.2 F (36.8 C), temperature source Oral, height 5\' 9"  (1.753 m), weight 90.6 kg, SpO2 100 %. Physical Exam Vitals and nursing note reviewed.  Constitutional:      Appearance: Normal appearance.  HENT:     Head: Normocephalic and atraumatic.  Cardiovascular:     Rate and Rhythm: Normal rate.     Pulses: Normal pulses.  Pulmonary:     Effort: Pulmonary effort is normal. No respiratory distress.  Skin:    Capillary Refill: Capillary refill takes less than 2 seconds.     Findings: Lesion present.  Neurological:     Mental Status: She is alert and oriented to person, place, and time.     Assessment/Plan Breast cancer.  Removal of bilateral breast expanders.  The risks that can be encountered were discussed and include the following but not limited to these: bleeding, infection, delayed healing, anesthesia risks, skin sensation changes, injury to structures including nerves, blood vessels, and muscles which may be temporary or permanent, allergies to tape, suture materials and glues, blood products, topical preparations or injected agents, skin  contour irregularities, skin discoloration and swelling, deep vein thrombosis, cardiac and pulmonary complications, pain, which may persist, fluid accumulation, changes in breast sensation, persistent pain, formation of tight scar tissue (capsular contracture), possible need for revisional surgery or staged procedures.    [place], DO 09/15/2021, 2:01 PM

## 2021-09-15 NOTE — Discharge Instructions (Addendum)
INSTRUCTIONS FOR AFTER BREAST SURGERY   You will likely have some questions about what to expect following your operation.  The following information will help you and your family understand what to expect when you are discharged from the hospital.  Following these guidelines will help ensure a smooth recovery and reduce risks of complications.  Postoperative instructions include information on: diet, wound care, medications and physical activity.  AFTER SURGERY Expect to go home after the procedure.  In some cases, you may need to spend one night in the hospital for observation.  DIET Breast surgery does not require a specific diet.  However, the healthier you eat the better your body can start healing. It is important to increasing your protein intake.  This means limiting the foods with sugar and carbohydrates.  Focus on vegetables and some meat.  If you have any liposuction during your procedure be sure to drink water.  If your urine is bright yellow, then it is concentrated, and you need to drink more water.  As a general rule after surgery, you should have 8 ounces of water every hour while awake.  If you find you are persistently nauseated or unable to take in liquids let us know.  NO TOBACCO USE or EXPOSURE.  This will slow your healing process and increase the risk of a wound.  WOUND CARE Leave the ACE wrap or binder on for 3 days . Use fragrance free soap.   After 3 days you can remove the ACE wrap or binder to shower. Once dry apply ACE wrap, binder or sports bra.  Use a mild soap like Dial, Dove and Ivory. You may have Topifoam or Lipofoam on.  It is soft and spongy and helps keep you from getting creases if you have liposuction.  This can be removed before the shower and then replaced.  If you need more it is available on Amazon (Lipofoam). If you have steri-strips / tape directly attached to your skin leave them in place. It is OK to get these wet.   No baths, pools or hot tubs for four  weeks. We close your incision to leave the smallest and best-looking scar. No ointment or creams on your incisions until given the go ahead.  Especially not Neosporin (Too many skin reactions with this one).  A few weeks after surgery you can use Mederma and start massaging the scar. We ask you to wear your binder or sports bra for the first 6 weeks around the clock, including while sleeping. This provides added comfort and helps reduce the fluid accumulation at the surgery site.  ACTIVITY No heavy lifting until cleared by the doctor.  This usually means no more than a half-gallon of milk.  It is OK to walk and climb stairs. In fact, moving your legs is very important to decrease your risk of a blood clot.  It will also help keep you from getting deconditioned.  Every 1 to 2 hours get up and walk for 5 minutes. This will help with a quicker recovery back to normal.  Let pain be your guide so you don't do too much.  This is not the time for spring cleaning and don't plan on taking care of anyone else.  This time is for you to recover,  You will be more comfortable if you sleep and rest with your head elevated either with a few pillows under you or in a recliner.  No stomach sleeping for a three months.  WORK Everyone   returns to work at different times. As a rough guide, most people take at least 1 - 2 weeks off prior to returning to work. If you need documentation for your job, bring the forms to your postoperative follow up visit.  DRIVING Arrange for someone to bring you home from the hospital.  You may be able to drive a few days after surgery but not while taking any narcotics or valium.  BOWEL MOVEMENTS Constipation can occur after anesthesia and while taking pain medication.  It is important to stay ahead for your comfort.  We recommend taking Milk of Magnesia (2 tablespoons; twice a day) while taking the pain pills.  MEDICATIONS You may be prescribed should start after surgery At your  preoperative visit for you history and physical you may have been given the following medications: An antibiotic: Start this medication when you get home and take according to the instructions on the bottle. Zofran 4 mg:  This is to treat nausea and vomiting.  You can take this every 6 hours as needed and only if needed. Valium 2 mg: This is for muscle tightness if you have an implant or expander. This will help relax your muscle which also helps with pain control.  This can be taken every 12 hours as needed. Don't drive after taking this medication. Norco (hydrocodone/acetaminophen) 5/325 mg:  This is only to be used after you have taken the motrin or the tylenol. Every 8 hours as needed.   Over the counter Medication to take: Ibuprofen (Motrin) 600 mg:  Take this every 6 hours.  If you have additional pain then take 500 mg of the tylenol every 8 hours.  Only take the Norco after you have tried these two. Miralax or stool softener of choice: Take this according to the bottle if you take the [place] Call your surgeon's office if any of the following occur: Fever 101 degrees F or greater Excessive bleeding or fluid from the incision site. Pain that increases over time without aid from the medications Redness, warmth, or pus draining from incision sites Persistent nausea or inability to take in liquids Severe misshapen area that underwent the operation.  Here are some resources:  Plastic surgery website: https://www.plasticsurgery.org/for-medical-professionals/education-and-resources/publications/breast-reconstruction-magazine Breast Reconstruction Awareness Campaign:  [employer] Plastic surgery Implant information:  https://www.plasticsurgery.org/patient-safety/breast-implant-safety  May take Tylenol after 8pm, if needed.   Post Anesthesia Home Care Instructions  Activity: Get plenty of rest for the remainder of the day. A responsible individual must  stay with you for 24 hours following the procedure.  For the next 24 hours, DO NOT: -Drive a car -Paediatric nurse -Drink alcoholic beverages -Take any medication unless instructed by your physician -Make any legal decisions or sign important papers.  Meals: Start with liquid foods such as gelatin or soup. Progress to regular foods as tolerated. Avoid greasy, spicy, heavy foods. If nausea and/or vomiting occur, drink only clear liquids until the nausea and/or vomiting subsides. Call your physician if vomiting continues.  Special Instructions/Symptoms: Your throat may feel dry or sore from the anesthesia or the breathing tube placed in your throat during surgery. If this causes discomfort, gargle with warm salt water. The discomfort should disappear within 24 hours.  If you had a scopolamine patch placed behind your ear for the management of post- operative nausea and/or vomiting:  1. The medication in the patch is effective for 72 hours, after which it should be removed.  Wrap patch in a tissue and discard in the  trash. Wash hands thoroughly with soap and water. 2. You may remove the patch earlier than 72 hours if you experience unpleasant side effects which may include dry mouth, dizziness or visual disturbances. 3. Avoid touching the patch. Wash your hands with soap and water after contact with the patch.    About my Jackson-Pratt Bulb Drain  What is a Jackson-Pratt bulb? A Jackson-Pratt is a soft, round device used to collect drainage. It is connected to a long, thin drainage catheter, which is held in place by one or two small stiches near your surgical incision site. When the bulb is squeezed, it forms a vacuum, forcing the drainage to empty into the bulb.  Emptying the Jackson-Pratt bulb- To empty the bulb: 1. Release the plug on the top of the bulb. 2. Pour the bulb's contents into a measuring container which your nurse will provide. 3. Record the time emptied and amount of  drainage. Empty the drain(s) as often as your     doctor or nurse recommends.  Date                  Time                    Amount (Drain 1)                 Amount (Drain 2)  _____________________________________________________________________  _____________________________________________________________________  _____________________________________________________________________  _____________________________________________________________________  _____________________________________________________________________  _____________________________________________________________________  _____________________________________________________________________  _____________________________________________________________________  Squeezing the Jackson-Pratt Bulb- To squeeze the bulb: 1. Make sure the plug at the top of the bulb is open. 2. Squeeze the bulb tightly in your fist. You will hear air squeezing from the bulb. 3. Replace the plug while the bulb is squeezed. 4. Use a safety pin to attach the bulb to your clothing. This will keep the catheter from     pulling at the bulb insertion site.  When to call your doctor- Call your doctor if: Drain site becomes red, swollen or hot. You have a fever greater than 101 degrees F. There is oozing at the drain site. Drain falls out (apply a guaze bandage over the drain hole and secure it with tape). Drainage increases daily not related to activity patterns. (You will usually have more drainage when you are active than when you are resting.) Drainage has a bad odor.

## 2021-09-15 NOTE — Transfer of Care (Signed)
Immediate Anesthesia Transfer of Care Note  Patient: Vickie Mathis  Procedure(s) Performed: REMOVAL OF BILATERAL TISSUE EXPANDERS (Bilateral: Breast)  Patient Location: PACU  Anesthesia Type:General  Level of Consciousness: awake, alert , oriented and patient cooperative  Airway & Oxygen Therapy: Patient Spontanous Breathing and Patient connected to face mask oxygen  Post-op Assessment: Report given to RN and Post -op Vital signs reviewed and stable  Post vital signs: Reviewed and stable  Last Vitals:  Vitals Value Taken Time  BP 122/60 09/15/21 1554  Temp    Pulse 78 09/15/21 1555  Resp 8 09/15/21 1556  SpO2 99 % 09/15/21 1555  Vitals shown include unvalidated device data.  Last Pain:  Vitals:   09/15/21 1147  TempSrc: Oral  PainSc: 7       Patients Stated Pain Goal: 3 (10/08/02 5913)  Complications: No notable events documented.

## 2021-09-15 NOTE — Anesthesia Postprocedure Evaluation (Signed)
Anesthesia Post Note  Patient: Vickie Mathis  Procedure(s) Performed: REMOVAL OF BILATERAL TISSUE EXPANDERS (Bilateral: Breast)     Patient location during evaluation: PACU Anesthesia Type: General Level of consciousness: awake and alert, oriented and patient cooperative Pain management: pain level controlled Vital Signs Assessment: post-procedure vital signs reviewed and stable Respiratory status: spontaneous breathing, nonlabored ventilation and respiratory function stable Cardiovascular status: blood pressure returned to baseline and stable Postop Assessment: no apparent nausea or vomiting Anesthetic complications: no   No notable events documented.  Last Vitals:  Vitals:   09/15/21 1600 09/15/21 1615  BP: (!) 117/56 (!) 123/54  Pulse: 91 89  Resp: (!) 21 17  Temp: 36.8 C   SpO2: 98% 99%    Last Pain:  Vitals:   09/15/21 1613  TempSrc:   PainSc: [place]

## 2021-09-15 NOTE — Anesthesia Procedure Notes (Signed)
Procedure Name: LMA Insertion Date/Time: 09/15/2021 2:58 PM Performed by: Tawni Millers, CRNA Pre-anesthesia Checklist: Patient identified, Emergency Drugs available, Suction available and Patient being monitored Patient Re-evaluated:Patient Re-evaluated prior to induction Oxygen Delivery Method: Circle system utilized Preoxygenation: Pre-oxygenation with 100% oxygen Induction Type: IV induction Ventilation: Mask ventilation without difficulty LMA: LMA inserted LMA Size: 4.0 Number of attempts: 1 Airway Equipment and Method: Bite block Placement Confirmation: positive ETCO2 Tube secured with: Tape Dental Injury: Teeth and Oropharynx as per pre-operative assessment

## 2021-09-15 NOTE — Anesthesia Preprocedure Evaluation (Signed)
Anesthesia Evaluation   Patient awake    Reviewed: Allergy & Precautions, NPO status , Patient's Chart, lab work & pertinent test results  History of Anesthesia Complications Negative for: history of anesthetic complications  Airway Mallampati: I  TM Distance: >3 FB Neck ROM: Full    Dental no notable dental hx. (+) Dental Advidsory Given   Pulmonary shortness of breath and with exertion, asthma (no inhalers in months, exercise induced) , neg sleep apnea, neg COPD, neg recent URI, Not current smoker,    Pulmonary exam normal breath sounds clear to auscultation       Cardiovascular (-) hypertension(-) angina(-) Past MI and (-) CHF Normal cardiovascular exam(-) dysrhythmias (-) Valvular Problems/Murmurs Rhythm:Regular Rate:Normal     Neuro/Psych neg Seizures PSYCHIATRIC DISORDERS Anxiety Depression    GI/Hepatic Neg liver ROS, neg GERD  ,  Endo/Other  neg diabetes  Renal/GU negative Renal ROS     Musculoskeletal   Abdominal   Peds  Hematology   Anesthesia Other Findings Past Medical History: No date: Anxiety No date: Asthma 11/2020: Breast cancer ([place])     Comment:  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 breast ca No date: Depression No date: Family history of cancer No date: History of chemotherapy No date: Varicose veins of bilateral lower extremities with pain   Reproductive/Obstetrics                             Anesthesia Physical  Anesthesia Plan  ASA: 2  Anesthesia Plan: General   Post-op Pain Management:    Induction: Intravenous  PONV Risk Score and Plan: 3 and Ondansetron, Dexamethasone, Midazolam and Treatment may vary due to age or medical condition  Airway Management Planned: LMA  Additional Equipment:   Intra-op Plan:   Post-operative Plan: Extubation in OR  Informed Consent: I have reviewed the patients History and Physical, chart, labs and discussed the procedure including  the risks, benefits and alternatives for the proposed anesthesia with the patient or authorized representative who has indicated his/her understanding and acceptance.       Plan Discussed with:   Anesthesia Plan Comments:         Anesthesia Quick Evaluation

## 2021-09-16 ENCOUNTER — Inpatient Hospital Stay: Payer: 59

## 2021-09-16 ENCOUNTER — Ambulatory Visit: Payer: 59

## 2021-09-16 NOTE — Progress Notes (Signed)
Left message stating courtesy call and if any questions or concerns please call the doctors office.  

## 2021-09-17 ENCOUNTER — Inpatient Hospital Stay: Payer: 59

## 2021-09-17 ENCOUNTER — Inpatient Hospital Stay: Payer: 59 | Attending: Oncology

## 2021-09-17 ENCOUNTER — Inpatient Hospital Stay (HOSPITAL_BASED_OUTPATIENT_CLINIC_OR_DEPARTMENT_OTHER): Payer: 59 | Admitting: Oncology

## 2021-09-17 ENCOUNTER — Other Ambulatory Visit: Payer: Self-pay

## 2021-09-17 ENCOUNTER — Encounter: Payer: Self-pay | Admitting: Oncology

## 2021-09-17 ENCOUNTER — Ambulatory Visit: Payer: 59

## 2021-09-17 VITALS — BP 117/58 | HR 70 | Resp 17

## 2021-09-17 DIAGNOSIS — Z5112 Encounter for antineoplastic immunotherapy: Secondary | ICD-10-CM | POA: Diagnosis present

## 2021-09-17 DIAGNOSIS — Z79899 Other long term (current) drug therapy: Secondary | ICD-10-CM | POA: Diagnosis not present

## 2021-09-17 DIAGNOSIS — C50412 Malignant neoplasm of upper-outer quadrant of left female breast: Secondary | ICD-10-CM | POA: Insufficient documentation

## 2021-09-17 DIAGNOSIS — C773 Secondary and unspecified malignant neoplasm of axilla and upper limb lymph nodes: Secondary | ICD-10-CM | POA: Diagnosis present

## 2021-09-17 DIAGNOSIS — Z95828 Presence of other vascular implants and grafts: Secondary | ICD-10-CM

## 2021-09-17 DIAGNOSIS — Z171 Estrogen receptor negative status [ER-]: Secondary | ICD-10-CM

## 2021-09-17 DIAGNOSIS — C50919 Malignant neoplasm of unspecified site of unspecified female breast: Secondary | ICD-10-CM

## 2021-09-17 LAB — COMPREHENSIVE METABOLIC PANEL
ALT: 32 U/L (ref 0–44)
AST: 23 U/L (ref 15–41)
Albumin: 3.6 g/dL (ref 3.5–5.0)
Alkaline Phosphatase: 82 U/L (ref 38–126)
Anion gap: 9 (ref 5–15)
BUN: 14 mg/dL (ref 6–20)
CO2: 24 mmol/L (ref 22–32)
Calcium: 8.5 mg/dL — ABNORMAL LOW (ref 8.9–10.3)
Chloride: 104 mmol/L (ref 98–111)
Creatinine, Ser: 0.81 mg/dL (ref 0.44–1.00)
GFR, Estimated: >60 mL/min (ref >60)
Glucose, Bld: 107 mg/dL — ABNORMAL HIGH (ref 70–99)
Potassium: 3.3 mmol/L — ABNORMAL LOW (ref 3.5–5.1)
Sodium: 137 mmol/L (ref 135–145)
Total Bilirubin: <0.1 mg/dL — ABNORMAL LOW (ref 0.3–1.2)
Total Protein: 6.2 g/dL — ABNORMAL LOW (ref 6.5–8.1)

## 2021-09-17 LAB — CBC WITH DIFFERENTIAL/PLATELET
Abs Immature Granulocytes: 0.04 10*3/uL (ref 0.00–0.07)
Basophils Absolute: 0.1 10*3/uL (ref 0.0–0.1)
Basophils Relative: 1 %
Eosinophils Absolute: 0.4 10*3/uL (ref 0.0–0.5)
Eosinophils Relative: 7 %
HCT: 29.2 % — ABNORMAL LOW (ref 36.0–46.0)
Hemoglobin: 9.4 g/dL — ABNORMAL LOW (ref 12.0–15.0)
Immature Granulocytes: 1 %
Lymphocytes Relative: 20 %
Lymphs Abs: 1.1 10*3/uL (ref 0.7–4.0)
MCH: 27.8 pg (ref 26.0–34.0)
MCHC: 32.2 g/dL (ref 30.0–36.0)
MCV: 86.4 fL (ref 80.0–100.0)
Monocytes Absolute: 0.6 10*3/uL (ref 0.1–1.0)
Monocytes Relative: 10 %
Neutro Abs: 3.4 10*3/uL (ref 1.7–7.7)
Neutrophils Relative %: 61 %
Platelets: 260 10*3/uL (ref 150–400)
RBC: 3.38 MIL/uL — ABNORMAL LOW (ref 3.87–5.11)
RDW: 14.9 % (ref 11.5–15.5)
WBC: 5.6 10*3/uL (ref 4.0–10.5)
nRBC: 0 % (ref 0.0–0.2)

## 2021-09-17 LAB — TSH: TSH: 5.374 u[IU]/mL — ABNORMAL HIGH (ref 0.350–4.500)

## 2021-09-17 MED ORDER — SODIUM CHLORIDE 0.9 % IV SOLN
200.0000 mg | Freq: Once | INTRAVENOUS | Status: AC
  Administered 2021-09-17: 15:00:00 200 mg via INTRAVENOUS
  Filled 2021-09-17: qty 8

## 2021-09-17 MED ORDER — HEPARIN SOD (PORK) LOCK FLUSH 100 UNIT/ML IV SOLN
INTRAVENOUS | Status: AC
  Filled 2021-09-17: qty 5

## 2021-09-17 MED ORDER — SODIUM CHLORIDE 0.9% FLUSH
10.0000 mL | Freq: Once | INTRAVENOUS | Status: AC
  Administered 2021-09-17: 14:00:00 10 mL via INTRAVENOUS
  Filled 2021-09-17: qty 10

## 2021-09-17 MED ORDER — SODIUM CHLORIDE 0.9 % IV SOLN
Freq: Once | INTRAVENOUS | Status: AC
  Filled 2021-09-17: qty 250

## 2021-09-17 MED ORDER — HEPARIN SOD (PORK) LOCK FLUSH 100 UNIT/ML IV SOLN
500.0000 [IU] | Freq: Once | INTRAVENOUS | Status: DC
  Administered 2021-09-17: 16:00:00 500 [IU] via INTRAVENOUS
  Filled 2021-09-17: qty 5

## 2021-09-17 MED ORDER — POTASSIUM CHLORIDE CRYS ER 20 MEQ PO TBCR: 20.0000 meq | EXTENDED_RELEASE_TABLET | Freq: Every day | ORAL | 0 refills | Status: DC

## 2021-09-17 MED ORDER — OXYCODONE HCL 5 MG PO TABS: 5.0000 mg | ORAL_TABLET | ORAL | 0 refills | Status: DC | PRN

## 2021-09-17 NOTE — Progress Notes (Addendum)
K also 3.3 today NP notified, Rx sent to pharmacy , patient agrees and verbalizes understanding. No questions. Patient tolerated Keytruda infusion well. Patient discharged. Stable.

## 2021-09-17 NOTE — Progress Notes (Signed)
Per NP ok to proceed with [place] with BP 101/53 and current labs

## 2021-09-17 NOTE — Progress Notes (Signed)
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  She sought medical attention recently after thinking it was a possible cyst for all this while.She underwent a diagnostic bilateral mammogram and ultrasound which showed a 2.7 cm mass at the 12 o'clock position 6 cm from the nipple.  Ultrasound of the left axilla demonstrates 2 lymph nodes with mild thickened cortices of 4 mm.  There is skin thickening on the lower inner quadrant of the left breast on mammography.  Both the breast mass and the lymph node were biopsied and was positive for invasive mammary carcinoma grade 3.  Lymph node was also suspicious for extracapsular extension.    ER/PR and HER-2 negative   MRI showed irregular enhancing mass at the 12 o'clock position of the left breast measuring 2.5 x 2.3 cm and a linear component extending 2.3 cm anteriorly.  The mass in the anterior linear extension combined measure 4.3 cm.  3.9 cm in the cephalocaudal dimension.  Also an area of clumped non-mass enhancement in the posterior aspect of the lower quadrant of the left breast measuring 2.6 x 0.9 cm which looks suspicious.  2 enlarged left axillary lymph nodes and 2 mildly enlarged internal mammary lymph nodes.  4.3 cm clumped area of non-mass enhancement in the outer quadrant of the right breast.  3 right axillary lymph  nodes with mild cortical thickening.   Patient underwent biopsy of the right breast non-mass enhancement and that was negative for malignancy   CT scan showed mildly enlarged right inguinal lymph nodes nonspecific.  Left upper breast mass with prominent left axillary lymph nodes.  Subcentimeter pulmonary nodules which are calcified and compatible with benign old granulomatous disease.  0.6 5.4 cm lucent lesion in the left first rib possibly a hemangioma or benign lesion.   Patient developed significant infusion reaction to carboplatin with dose 9 when her blood pressure dropped and she became tachycardic and significantly nauseous.  She went on to complete [facility] Keytruda chemotherapy as per keynote 522 regimen.  Patient underwent bilateral mastectomy with reconstruction in September 2022.  Final pathology showed complete pathological response2 sentinel lymph nodes negative for malignancy.  No malignancy noted in the right breast.   Interval history-reports feeling fair. She had bilateral breast expanders removed on Monday.  She had protracted seroma on right breast and had a small area of skin loss on left breast from the dye that continues to improve.  She recovered from that and then had exposure of right breast expander thought to be secondary to chemotherapy.  She currently has JP drains from where her expanders once were.  She has follow-up on Friday with plastic surgery.  She was scheduled to have a consultation with Dr. Aggie Cosier for radiation tomorrow which we will push out due to recent procedure.  She complains of pain and that the Norco is not covering it.  Is requesting oxycodone  for coverage.   ECOG PS- 1 Pain scale- 3 Opioid associated constipation- no  Review of systems- Review of Systems  Constitutional:  Positive for malaise/fatigue.  Skin:        Expanders removed- JB drains in place  Psychiatric/Behavioral:  The patient is nervous/anxious and has insomnia.       Allergies   Allergen Reactions   Carboplatin Shortness Of Breath, Nausea And Vomiting and Other (See Comments)    Flushing- chest , face, neck , arm including hand   Amoxicillin     Other reaction(s): "too young to remember what they do to me"   Sulfa Antibiotics     Other reaction(s): "too young to remember what they do to me"     Past Medical History:  Diagnosis Date   Anxiety    Asthma    Breast cancer (HCC) 11/2020   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 breast ca   Depression    Family history of cancer    History of chemotherapy    Varicose veins of bilateral lower extremities with pain      Past Surgical History:  Procedure Laterality Date   APPENDECTOMY  2018   BILATERAL TOTAL MASTECTOMY WITH AXILLARY LYMPH NODE DISSECTION Bilateral 06/25/2021   Procedure: BILATERAL TOTAL MASTECTOMY WITH LEFT AXILLARY LYMPH NODE BIOPSY VS. AXILLARY NODE DISSECTION;  Surgeon: Carolan Shiver, MD;  Location: [facility] ORS;  Service: General;  Laterality: Bilateral;  [place], 1.5 hours [place]Diaz 2.5 hours   BREAST BIOPSY Left 11/26/2020   vision 12:00 6cmfn [facility]   BREAST BIOPSY Left 11/26/2020   LN bx, hydro marker,  fragments of macrometastatic carcinoma   BREAST RECONSTRUCTION WITH PLACEMENT OF TISSUE EXPANDER AND FLEX HD (ACELLULAR HYDRATED DERMIS) Bilateral 06/25/2021   Procedure: BREAST RECONSTRUCTION WITH PLACEMENT OF TISSUE EXPANDER AND FLEX HD (ACELLULAR HYDRATED DERMIS);  Surgeon: Peggye Form, DO;  Location: [facility] ORS;  Service: Plastics;  Laterality: Bilateral;   PORTACATH PLACEMENT Right 12/13/2020   Procedure: INSERTION PORT-A-CATH;  Surgeon: Carolan Shiver, MD;  Location: [facility] ORS;  Service: General;  Laterality: Right;    Social History   Socioeconomic History   Marital status: Married    Spouse name: Not on file   Number of children: Not on file   Years of education: Not on file   Highest education level: Not on file  Occupational History   Not on file  Tobacco Use    Smoking status: Never   Smokeless tobacco: Never  Vaping Use   Vaping Use: Never used  Substance and Sexual Activity   Alcohol use: Not Currently    Comment: occassinally    Drug use: Yes    Types: Marijuana    Comment: occ   Sexual activity: Yes  Other Topics Concern   Not on file  Social History Narrative   Not on file   Social Determinants of Health   Financial Resource Strain: Not on file  Food Insecurity: Not on file  Transportation Needs: Not on file  Physical Activity: Not on file  Stress: Not on file  Social Connections: Not on file  Intimate Partner Violence: Not on file    Family History  Problem Relation Age of Onset   Diabetes Father    Varicose Veins Father    Diabetes Paternal Aunt    Uterine cancer Paternal Aunt        precancerous   Diabetes Paternal Grandmother    Uterine cancer Paternal Grandmother        precancerous  Thyroid disease Mother    Thyroid disease Maternal Aunt    Thyroid disease Maternal Grandmother    Cancer Other        stomach vs ovarian/cervical   Cancer Paternal Great-grandmother        unk     Current Outpatient Medications:    busPIRone (BUSPAR) 15 MG tablet, Take 15 mg by mouth 2 (two) times daily., Disp: , Rfl:    citalopram (CELEXA) 40 MG tablet, Take 1 tablet (40 mg total) by mouth daily. (Patient taking differently: Take 40 mg by mouth at bedtime.), Disp: 90 tablet, Rfl: 2   clonazePAM (KLONOPIN) 0.5 MG tablet, TAKE 1 TABLET BY MOUTH 3 TIMES DAILY AS NEEDED., Disp: 60 tablet, Rfl: 0   Cyanocobalamin (B-12 PO), Take 1 tablet by mouth daily., Disp: , Rfl:    diazepam (VALIUM) 2 MG tablet, Take 1 tablet (2 mg total) by mouth every 12 (twelve) hours as needed for muscle spasms., Disp: 20 tablet, Rfl: 0   docusate sodium (COLACE) 100 MG capsule, Take 100 mg by mouth daily as needed for mild constipation., Disp: , Rfl:    folic acid (FOLVITE) 1 MG tablet, TAKE 2 TABLETS BY MOUTH EVERY DAY, Disp: 180 tablet, Rfl: 0    hydrOXYzine (VISTARIL) 25 MG capsule, Take 1 capsule (25 mg total) by mouth every 8 (eight) hours as needed for up to 10 doses., Disp: 10 capsule, Rfl: 0   ibuprofen (ADVIL) 200 MG tablet, Take 400 mg by mouth every 6 (six) hours as needed for moderate pain., Disp: , Rfl:    lidocaine-prilocaine (EMLA) cream, Apply 1 application topically daily as needed (prior to port access)., Disp: 30 g, Rfl: 3   methocarbamol (ROBAXIN) 500 MG tablet, TAKE 1 TABLET BY MOUTH 3 TIMES DAILY FOR 14 DAYS., Disp: 42 tablet, Rfl: 0   Multiple Vitamin (MULTIVITAMIN WITH MINERALS) TABS tablet, Take 1 tablet by mouth daily., Disp: , Rfl:    OLANZapine (ZYPREXA) 10 MG tablet, TAKE 1 TABLET BY MOUTH EVERYDAY AT BEDTIME, Disp: 90 tablet, Rfl: 1   ondansetron (ZOFRAN ODT) 4 MG disintegrating tablet, Take 1 tablet (4 mg total) by mouth every 8 (eight) hours as needed for nausea or vomiting., Disp: 20 tablet, Rfl: 0   ondansetron (ZOFRAN) 8 MG tablet, Take 1 tablet (8 mg total) by mouth every 8 (eight) hours as needed for refractory nausea / vomiting. Start on day 3 after chemo., Disp: 60 tablet, Rfl: 1   ondansetron (ZOFRAN-ODT) 4 MG disintegrating tablet, Take 1 tablet (4 mg total) by mouth every 8 (eight) hours as needed for nausea or vomiting., Disp: 20 tablet, Rfl: 0   prochlorperazine (COMPAZINE) 10 MG tablet, Take 1 tablet (10 mg total) by mouth every 6 (six) hours as needed (Nausea or vomiting)., Disp: 30 tablet, Rfl: 1   traMADol (ULTRAM) 50 MG tablet, Take 1 tablet (50 mg total) by mouth every 8 (eight) hours as needed for up to 5 doses., Disp: 5 tablet, Rfl: 0   triamcinolone ointment (KENALOG) 0.5 %, Apply 1 application topically 2 (two) times daily., Disp: 30 g, Rfl: 0   TURMERIC CURCUMIN PO, Take 2 capsules by mouth daily., Disp: , Rfl:    gabapentin (NEURONTIN) 300 MG capsule, TAKE 1 CAPSULE (300 MG TOTAL) BY MOUTH 3 (THREE) TIMES DAILY FOR 14 DAYS., Disp: 42 capsule, Rfl: 0   oxyCODONE (OXY IR/ROXICODONE) 5 MG  immediate release tablet, Take 1 tablet (5 mg total) by mouth every 4 (four) hours as needed  for severe pain., Disp: 30 tablet, Rfl: 0   potassium chloride SA (KLOR-CON M) 20 MEQ tablet, Take 1 tablet (20 mEq total) by mouth daily., Disp: 7 tablet, Rfl: 0 No current facility-administered medications for this visit.  Facility-Administered Medications Ordered in Other Visits:    heparin lock flush 100 UNIT/ML injection, , , ,    prochlorperazine (COMPAZINE) tablet 10 mg, 10 mg, Oral, Q6H PRN, Sindy Guadeloupe, MD, 10 mg at 07/16/21 1407  Physical exam:  Vitals:   09/17/21 1338  BP: (!) 101/53  Pulse: 80  Resp: 16  Temp: 99.1 F (37.3 C)  TempSrc: Tympanic  SpO2: 98%  Weight: 202 lb (91.6 kg)  Height: $Remove'5\' 9"'ZUQSfBA$  (1.753 m)   Physical Exam Constitutional:      Appearance: Normal appearance.  HENT:     Head: Normocephalic and atraumatic.  Eyes:     Pupils: Pupils are equal, round, and reactive to light.  Cardiovascular:     Rate and Rhythm: Normal rate and regular rhythm.     Heart sounds: Normal heart sounds. No murmur heard. Pulmonary:     Effort: Pulmonary effort is normal.     Breath sounds: Normal breath sounds. No wheezing.  Chest:     Comments: Bandage over Bilateral breast- JP drains in place.  Abdominal:     General: Bowel sounds are normal. There is no distension.     Palpations: Abdomen is soft.     Tenderness: There is no abdominal tenderness.  Musculoskeletal:        General: Normal range of motion.     Cervical back: Normal range of motion.  Skin:    General: Skin is warm and dry.     Findings: No rash.  Neurological:     Mental Status: She is alert and oriented to person, place, and time.  Psychiatric:        Judgment: Judgment normal.     CMP Latest Ref Rng & Units 09/17/2021  Glucose 70 - 99 mg/dL 107(H)  BUN 6 - 20 mg/dL 14  Creatinine 0.44 - 1.00 mg/dL 0.81  Sodium 135 - 145 mmol/L 137  Potassium 3.5 - 5.1 mmol/L 3.3(L)  Chloride 98 - 111 mmol/L 104   CO2 22 - 32 mmol/L 24  Calcium 8.9 - 10.3 mg/dL 8.5(L)  Total Protein 6.5 - 8.1 g/dL 6.2(L)  Total Bilirubin 0.3 - 1.2 mg/dL <0.1(L)  Alkaline Phos 38 - 126 U/L 82  AST 15 - 41 U/L 23  ALT 0 - 44 U/L 32   CBC Latest Ref Rng & Units 09/17/2021  WBC 4.0 - 10.5 K/uL 5.6  Hemoglobin 12.0 - 15.0 g/dL 9.4(L)  Hematocrit 36.0 - 46.0 % 29.2(L)  Platelets 150 - 400 K/uL 260    Assessment and plan- Patient is a 30 y.o. female with stage IIIb triple negative breast cancer of the left breast cT2 N1 M0.  She is s/p neoadjuvant chemotherapy as per keynote 522 protocol followed by complete pathological response.  She is here for on treatment assessment prior to cycle 3 of adjuvant Keytruda.  Counts are okay for cycle 3 of Keytruda.  Mild hypokalemia discussed starting on potassium supplements for 7 days.  New prescription sent.  Mild anemia-hemoglobin 9.4.  Overall stable.  She is scheduled to return to clinic on 10/08/2021 for cycle 4 of Keytruda.  She has a CT scan scheduled for 10/01/2021.  Her radiation consultation will be moved out from tomorrow until after Christmas to allow for healing  from recent expander removal.   Pain management- Presently on Norco without improvement of her pain.  Previously was on oxycodone 5 mg tablets.  Discussed with Merrily Pew Borders who is in agreement to switch from [place] to oxycodone.  New Rx sent.  Hypokalemia- Potassium 3.2.  20 mEq potassium daily x7 days sent to pharmacy.  Disposition- Keytruda today. Follow-up with plastic surgery on Friday. Radiation oncology appointment moved out to next week. A prescription sent for potassium 20 mEq daily x7 days. Oxycodone prescription 5 mg every 4-6 hours sent. Return to clinic after CT scan on 10/01/2021 for results and next cycle of Keytruda.  I spent 25 minutes dedicated to the care of this patient (face-to-face and non-face-to-face) on the date of the encounter to include what is described in the assessment and  plan.  Visit Diagnosis 1. Malignant neoplasm of upper-outer quadrant of left breast in female, estrogen receptor negative ([place])     Faythe Casa, NP 09/17/2021 4:28 PM

## 2021-09-17 NOTE — Patient Instructions (Signed)
[facility] [place]  Discharge Instructions: °Thank you for choosing [facility] to provide your oncology and hematology care.  °If you have a lab appointment with the Cancer Center, please go directly to the Cancer Center and check in at the registration area. ° °Wear comfortable clothing and clothing appropriate for easy access to any Portacath or PICC line.  ° °We strive to give you quality time with your provider. You may need to reschedule your appointment if you arrive late (15 or more minutes).  Arriving late affects you and other patients whose appointments are after yours.  Also, if you miss three or more appointments without notifying the office, you may be dismissed from the clinic at the provider’s discretion.    °  °For prescription refill requests, have your pharmacy contact our office and allow 72 hours for refills to be completed.   ° °Today you received the following chemotherapy and/or immunotherapy agents Keytruda °    °  °To help prevent nausea and vomiting after your treatment, we encourage you to take your nausea medication as directed. ° °BELOW ARE SYMPTOMS THAT SHOULD BE REPORTED IMMEDIATELY: °*FEVER GREATER THAN 100.4 F (38 °C) OR HIGHER °*CHILLS OR SWEATING °*NAUSEA AND VOMITING THAT IS NOT CONTROLLED WITH YOUR NAUSEA MEDICATION °*UNUSUAL SHORTNESS OF BREATH °*UNUSUAL BRUISING OR BLEEDING °*URINARY PROBLEMS (pain or burning when urinating, or frequent urination) °*BOWEL PROBLEMS (unusual diarrhea, constipation, pain near the anus) °TENDERNESS IN MOUTH AND THROAT WITH OR WITHOUT PRESENCE OF ULCERS (sore throat, sores in mouth, or a toothache) °UNUSUAL RASH, SWELLING OR PAIN  °UNUSUAL VAGINAL DISCHARGE OR ITCHING  ° °Items with * indicate a potential emergency and should be followed up as soon as possible or go to the Emergency Department if any problems should occur. ° °Please show the CHEMOTHERAPY ALERT CARD or IMMUNOTHERAPY ALERT CARD at check-in to  the Emergency Department and triage nurse. ° °Should you have questions after your visit or need to cancel or reschedule your appointment, please contact [facility]  [phone] and follow the prompts.  Office hours are 8:00 a.m. to 4:30 p.m. Monday - Friday. Please note that voicemails left after 4:00 p.m. may not be returned until the following business day.  We are closed weekends and major holidays. You have access to a nurse at all times for urgent questions. Please call the main number to the clinic [phone] and follow the prompts. ° °For any non-urgent questions, you may also contact your provider using MyChart. We now offer e-Visits for anyone 18 and older to request care online for non-urgent symptoms. For details visit [email or web address]. °  °Also download the MyChart app! Go to the app store, search "MyChart", open the app, select [facility], and log in with your MyChart username and password. ° °Due to Covid, a mask is required upon entering the hospital/clinic. If you do not have a mask, one will be given to you upon arrival. For doctor visits, patients may have 1 support person aged 18 or older with them. For treatment visits, patients cannot have anyone with them due to current Covid guidelines and our immunocompromised population.  °

## 2021-09-18 ENCOUNTER — Inpatient Hospital Stay: Payer: 59

## 2021-09-18 ENCOUNTER — Encounter (HOSPITAL_BASED_OUTPATIENT_CLINIC_OR_DEPARTMENT_OTHER): Payer: Self-pay | Admitting: Plastic Surgery

## 2021-09-18 ENCOUNTER — Ambulatory Visit: Payer: 59

## 2021-09-18 ENCOUNTER — Ambulatory Visit: Payer: 59 | Admitting: Radiation Oncology

## 2021-09-18 LAB — AEROBIC/ANAEROBIC CULTURE W GRAM STAIN (SURGICAL/DEEP WOUND)

## 2021-09-19 ENCOUNTER — Inpatient Hospital Stay: Payer: 59

## 2021-09-19 ENCOUNTER — Ambulatory Visit (INDEPENDENT_AMBULATORY_CARE_PROVIDER_SITE_OTHER): Payer: 59 | Admitting: Physician Assistant

## 2021-09-19 ENCOUNTER — Other Ambulatory Visit: Payer: Self-pay

## 2021-09-19 ENCOUNTER — Ambulatory Visit: Payer: 59

## 2021-09-19 DIAGNOSIS — Z9889 Other specified postprocedural states: Secondary | ICD-10-CM

## 2021-09-19 NOTE — Progress Notes (Signed)
Patient is a 30 year old female with PMH of stage IIIb triple negative breast cancer left breast s/p bilateral total mastectomy with immediate breast reconstruction with expander and Flex HD placement performed 06/25/2021 who developed postoperative complications and after much discussion with patient led to decision to remove bilateral tissue expanders 09/15/2021.  She presents today for postoperative follow-up.  Patient was seen by oncology 09/17/2021 who agreed to change her Norco to oxycodone for pain control.  She is also having her RT pushed until she recovers from her recent surgery.  Today, she is accompanied by her sister-in-law who was at bedside.  She states that she has had considerable pain since her surgery, but likely postoperative.  Denies any fevers, chills, redness, streaking, or asymmetry.  She inquired about her honeycomb dressings and was told to leave them in place for at least other week.  She states that her drains are functioning well.  She is draining approximately 30 cc/day from each side.  Physical exam is reassuring.  No obvious asymmetry or swelling noted.  No obvious subcutaneous fluid collections appreciated.  Honeycomb dressings remain firmly intact.  JP drains intact and functional.  Normal-appearing drainage in bulbs bilaterally.  No redness or streaking appreciated behind the honeycomb dressings that would otherwise suggest infection.  She is well-appearing.  Plan is for her to return in 10 days for possible drain removal and reevaluation.  Continue with daily compression bra and activity modification.  No evidence of infection at this time, she is encouraged to continue with her pain management as prescribed.  She knows to call the clinic should she develop any questions or concerns.

## 2021-09-21 ENCOUNTER — Other Ambulatory Visit: Payer: Self-pay

## 2021-09-22 ENCOUNTER — Inpatient Hospital Stay: Payer: 59

## 2021-09-22 ENCOUNTER — Ambulatory Visit: Payer: 59

## 2021-09-23 ENCOUNTER — Ambulatory Visit: Payer: 59 | Admitting: Physician Assistant

## 2021-09-23 ENCOUNTER — Ambulatory Visit: Payer: 59

## 2021-09-23 ENCOUNTER — Inpatient Hospital Stay: Payer: 59

## 2021-09-23 ENCOUNTER — Encounter: Payer: 59 | Admitting: Plastic Surgery

## 2021-09-23 ENCOUNTER — Telehealth: Payer: Self-pay

## 2021-09-23 NOTE — Telephone Encounter (Signed)
Pt called to cancel apt for 12/21 due to having drains still in and pain. Pt said she needed apt well after christmas. She is scheduled for 2/3.  Pt claimed this is going to work better for her due to the pain and drainage that she's continuing to have.

## 2021-09-23 NOTE — Progress Notes (Signed)
She just went home with a 3-day course of Keflex. We could certainly call her in doxy or bactrim based on those susceptibilities, but she looked perfectly fine at initial post-op, no evidence of infection. Only rare staph on culture and she had washout + IV abx.

## 2021-09-24 ENCOUNTER — Inpatient Hospital Stay: Payer: 59

## 2021-09-24 ENCOUNTER — Ambulatory Visit: Payer: 59 | Admitting: Radiation Oncology

## 2021-09-24 ENCOUNTER — Ambulatory Visit: Payer: 59

## 2021-09-24 NOTE — Telephone Encounter (Signed)
Prescriptions were sent to the pharmacy on (09/11/21).//AB/CMA

## 2021-09-25 ENCOUNTER — Ambulatory Visit: Payer: 59

## 2021-09-25 ENCOUNTER — Inpatient Hospital Stay: Payer: 59

## 2021-09-26 ENCOUNTER — Ambulatory Visit: Payer: 59

## 2021-09-30 ENCOUNTER — Ambulatory Visit: Payer: 59

## 2021-09-30 ENCOUNTER — Other Ambulatory Visit: Payer: Self-pay

## 2021-09-30 ENCOUNTER — Ambulatory Visit (INDEPENDENT_AMBULATORY_CARE_PROVIDER_SITE_OTHER): Payer: 59 | Admitting: Physician Assistant

## 2021-09-30 DIAGNOSIS — Z9889 Other specified postprocedural states: Secondary | ICD-10-CM

## 2021-09-30 MED ORDER — DOXYCYCLINE HYCLATE 100 MG PO TABS: 100.0000 mg | ORAL_TABLET | Freq: Two times a day (BID) | ORAL | 0 refills | Status: AC

## 2021-09-30 MED ORDER — HYDROXYZINE PAMOATE 25 MG PO CAPS: 25.0000 mg | ORAL_CAPSULE | Freq: Every evening | ORAL | 0 refills | Status: DC | PRN

## 2021-09-30 NOTE — Progress Notes (Signed)
Patient is a 30 year old female with PMH of stage IIIb triple negative breast cancer left breast s/p bilateral total mastectomy with immediate breast reconstruction with expander and Flex HD placement performed 06/25/2021 who developed postoperative complications and after much discussion with patient led to decision to remove bilateral tissue expanders 09/15/2021.  She presents today for postoperative follow-up.   She was last seen here in clinic on 09/19/2021.  At that time, she was doing quite well.  Physical exam was entirely reassuring.  She was draining approximately 30 cc/day from each side.  Normal-appearing drainage in bulbs bilaterally.  Plan was for her to return in 10 days for possible drain removal and reevaluation.  Recommended continued daily compression and activity modification in interim.  Today, patient is accompanied by her husband [name] at bedside.  She states that she has been having fluctuating volume output from the right drain over the course of the past week.  Fluctuating between 30 to 50 cc.  She has been consistently less than 25 cc on the left side for a couple of weeks.  She endorses mild discomfort bilaterally.  No fevers or redness.  Physical exam is reassuring.  The wound over left breast has improved considerably.  They will continue to apply K-Y jelly +/- collagen dressing changes.  They currently have appointment scheduled with RT for 11/2021.  Left drain removed without complication or difficulty.  After discussion with patient, we will leave the right drain in for another 7 to 10 days given fluctuating volumes.  The drainage in the bulb appear to be normal.  Not cloudy or otherwise concerning for infection.  We will refill her hydroxyzine as that has been helping her sleep at night.  We will also prescribe her a course of doxycycline given the fluctuating volumes of drainage from right side in addition to her pain symptoms and culture report.  Discussed with Dr.  Marla Roe who agrees with plan.

## 2021-10-01 ENCOUNTER — Ambulatory Visit
Admission: RE | Admit: 2021-10-01 | Discharge: 2021-10-01 | Disposition: A | Discharge status: Home / Self Care | Payer: 59 | Source: Ambulatory Visit | Attending: Oncology | Admitting: Oncology

## 2021-10-01 ENCOUNTER — Telehealth: Payer: Self-pay | Admitting: *Deleted

## 2021-10-01 DIAGNOSIS — Z171 Estrogen receptor negative status [ER-]: Secondary | ICD-10-CM | POA: Diagnosis present

## 2021-10-01 DIAGNOSIS — C50412 Malignant neoplasm of upper-outer quadrant of left female breast: Secondary | ICD-10-CM | POA: Insufficient documentation

## 2021-10-01 IMAGING — CT CT CHEST-ABD-PELV W/ CM
2 of 4 series · 12 of 36 positions shown, 14 images · IV contrast (omnipaque)
Comparison: Chest, abdomen and pelvis CT with contrast [DATE]

CLINICAL DATA: Breast cancer follow-up after treatment. Previous
history left breast cancer and mastectomy, currently with a drain in
the right breast.

EXAM:
CT CHEST, ABDOMEN, AND PELVIS WITH CONTRAST
TECHNIQUE: Multidetector CT imaging of the chest, abdomen and pelvis was
performed following the standard protocol during bolus
administration of intravenous contrast.
CONTRAST:  100mL OMNIPAQUE IOHEXOL 300 MG/ML  SOLN

[Series 2: cap with · axial · 0.84mm/px · z∈[-1035,-435]mm · 9 of 145 slices shown, 11 images]
[im 13/145  mediastinal]
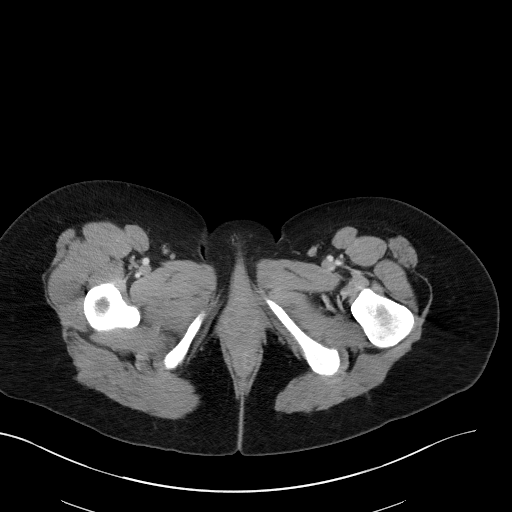
[im 13/145  bone]
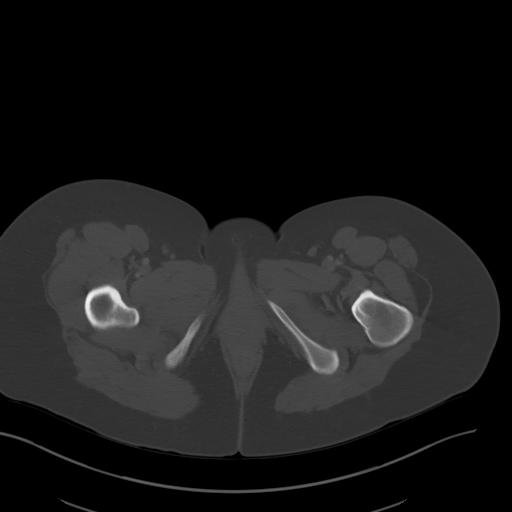
[im 25/145  mediastinal]
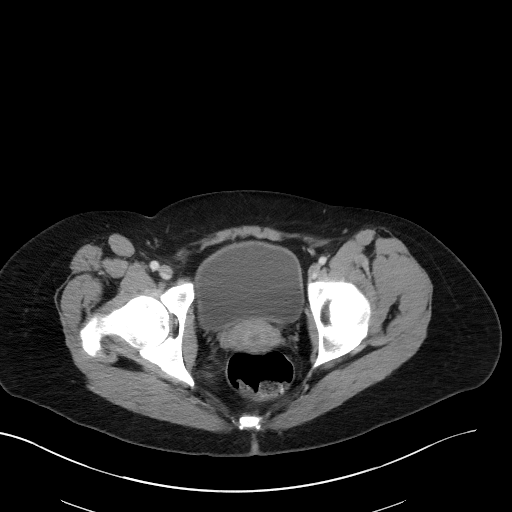
[im 49/145  mediastinal]
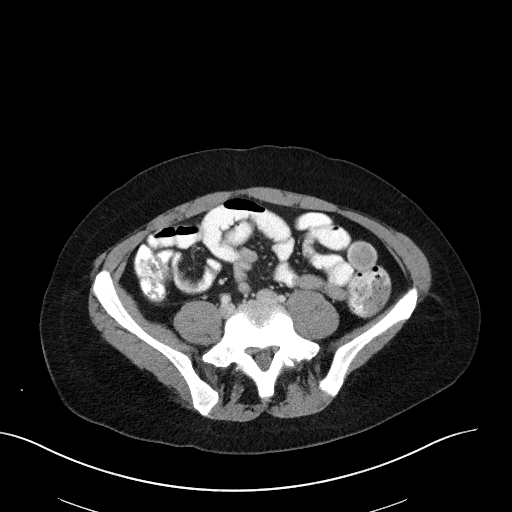
[im 61/145  mediastinal]
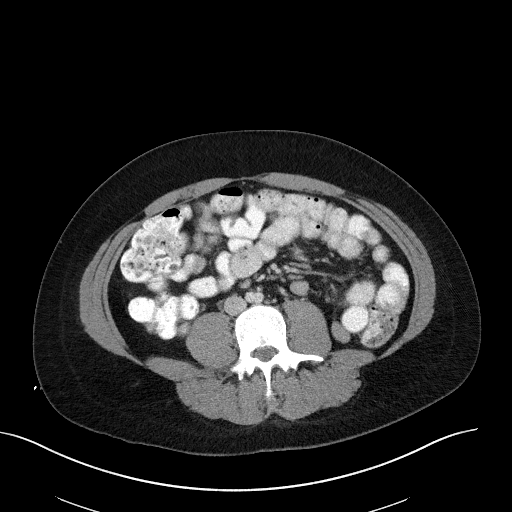
[im 73/145  mediastinal]
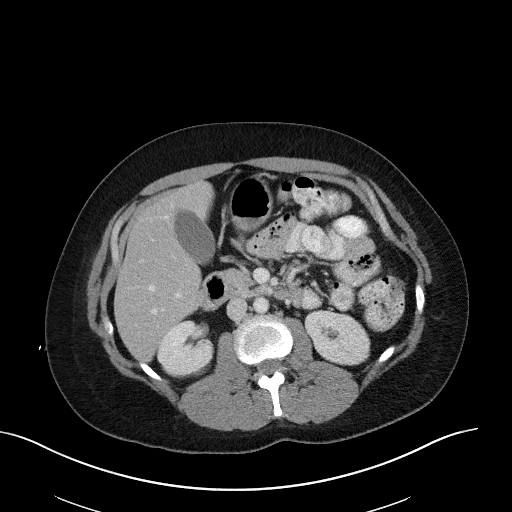
[im 85/145  mediastinal]
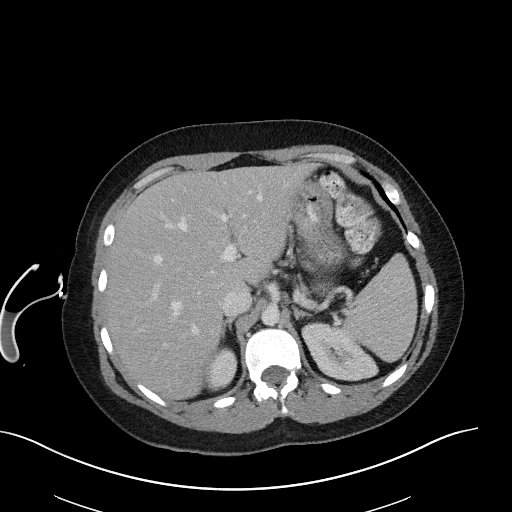
[im 97/145  mediastinal]
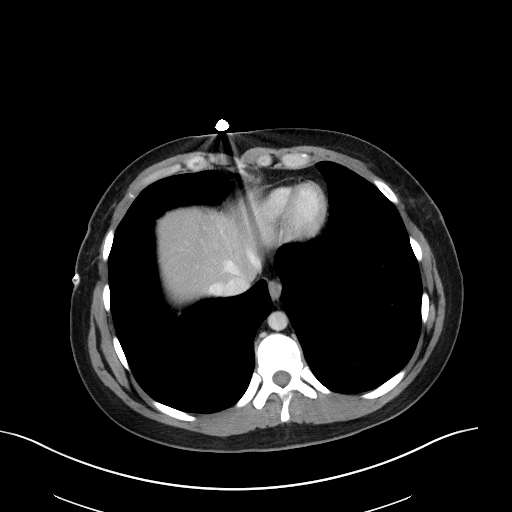
[im 121/145  mediastinal]
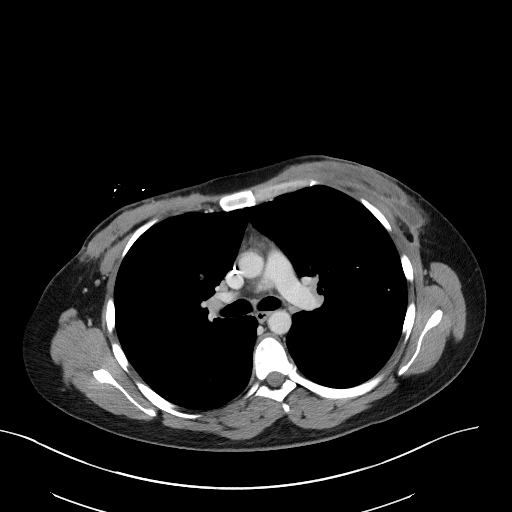
[im 133/145  mediastinal]
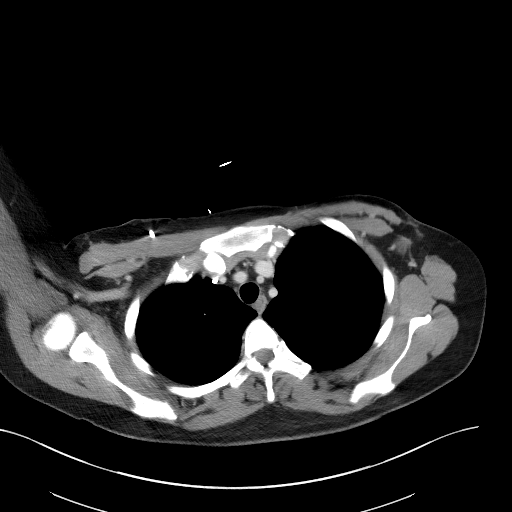
[im 133/145  bone]
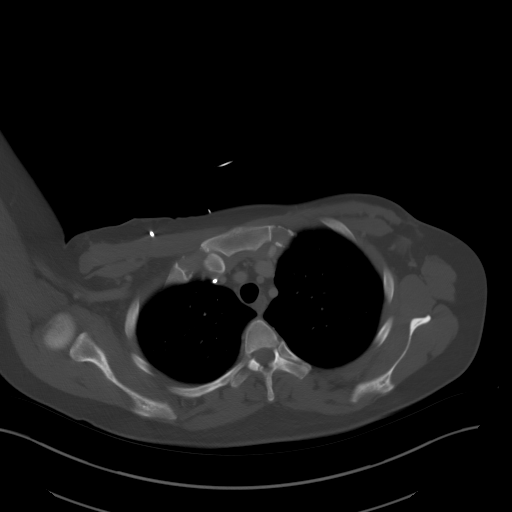

[Series 5: coronals · coronal · 0.77mm/px · 3 of 147 slices shown]
[im 30/147  mediastinal]
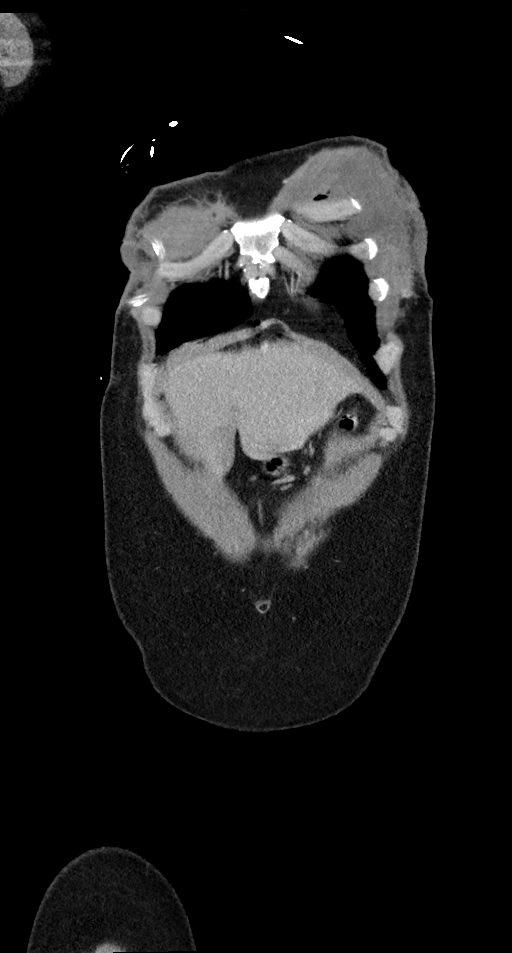
[im 59/147  mediastinal]
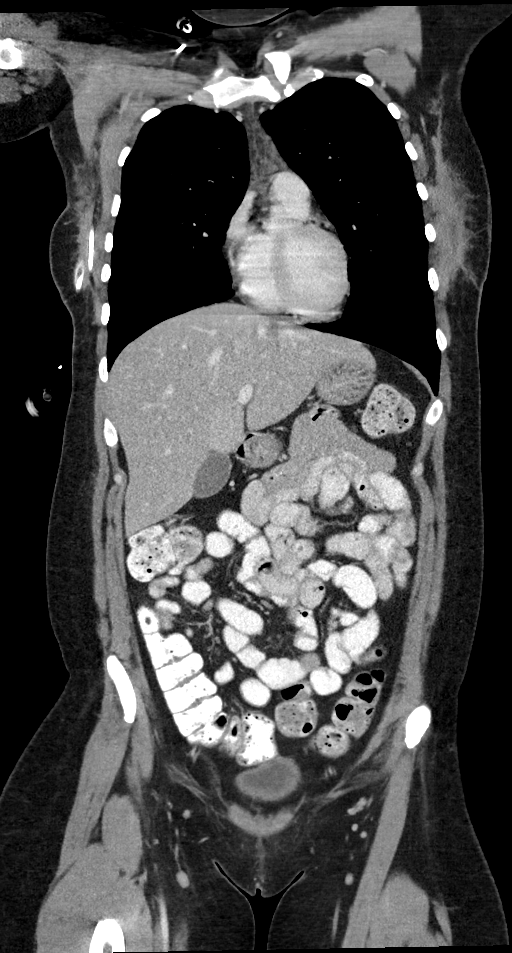
[im 88/147  mediastinal]
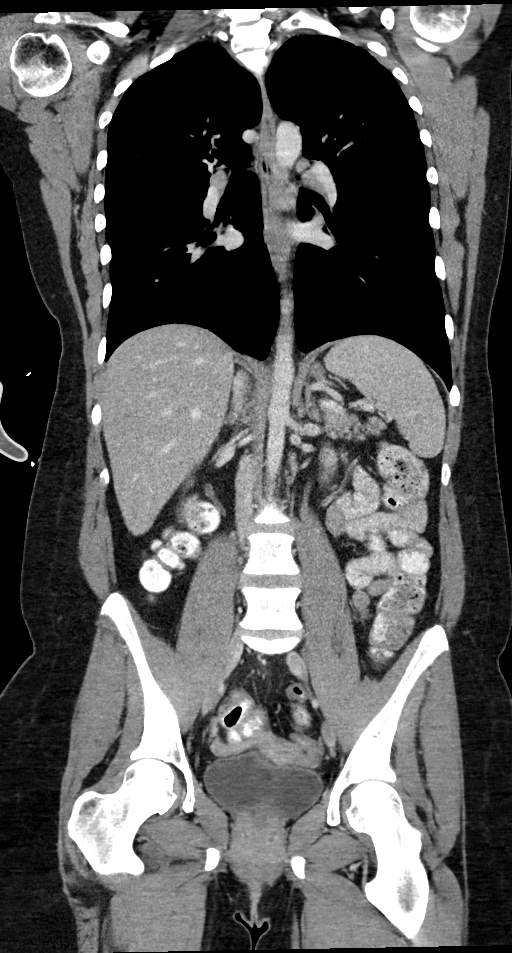

[12 of 36 positions shown; findings below may reference images not displayed]

FINDINGS: CT CHEST FINDINGS

Cardiovascular: Cardiac size, central pulmonary arteries and aorta
are normal. There is no pericardial effusion venous distention. No
aortic atherosclerosis. Right IJ port catheter tip remains in the
SVC.

Mediastinum/Nodes: There are small right axillary lymph nodes which
are unchanged a few tiny left axillary nodes also unchanged. There
previously were mildly prominent left axillary nodes which appear to
have been removed. There is thin walled left axillary fluid
collection measuring 2.3 cm and -1.0 Hounsfield units which probably
a postoperative seroma.

There are few slightly prominent bilateral hilar nodes again
measuring up to 1.1 cm in short axis on both sides. Right
paratracheal lymph node on series 2 axial 29 measures 7.9 mm,
previously 6.6 mm.

There are small stable AP window lymph nodes up to 7.9 mm in short
axis, similar sized subcarinal lymph nodes also unchanged. The lower
poles of the thyroid gland and esophagus are unremarkable and there
is no further adenopathy.

Lungs/Pleura: There are calcified granulomas again noted in the
superior segments of both lower lobes. There is a calcified
granuloma posteriorly in the left upper lobe at the level of the
carina.

There is a 3 mm stable noncalcified nodule anteriorly in the left
upper lobe on series 4 axial 37 and a 3.5 mm stable noncalcified
left lower lobe nodule on series 4 axial 28.

The trachea, central airways remaining lungs are clear. There is no
pleural effusion or pneumothorax.

Musculoskeletal: Interval bilateral mastectomies. Surgical drain is
in place in the right chest wall. Underlying the left mastectomy
there is a subcutaneous rim enhancing fluid collection extending
along the anterolateral chest wall and measuring 5.3 cm AP, 0.9 cm
coronal and up to 11 cm craniocaudal, probable seroma versus
hematoma, less likely abscess.

There were there are scattered air pockets in the deep subcutaneous
plane over the anterior chest wall on the left, and as above, a
cm thin walled fluid collection in the left axilla, subcutaneous
stranding underlying the mastectomy sites on both sides.

There is a stable 6.9 x 5.4 mm lucent lesion in the posteromedial
left first rib, on image 7 of series 4. No other focal regional bone
lesion is seen.

CT ABDOMEN PELVIS FINDINGS

Hepatobiliary: There is no mass enhancement. Mild hepatic steatosis.
Unremarkable gallbladder common bile ducts.

Pancreas: No mass or ductal dilatation. Mild tail segmental atrophy.

Spleen: Normal.

Adrenals/Urinary Tract: No mass enhancement, hydronephrosis or
calculus, or bladder thickening.

Stomach/Bowel: No dilatation or wall thickening. An appendix is not
seen in this patient. There is moderate fecal stasis.

Vascular/Lymphatic: There previously were mildly prominent right
external iliac chain and right inguinal lymph nodes which are no
longer prominent. Small subcentimeter mesenteric root lymph nodes
are unchanged. There are no enlarged abdominal or pelvic nodes by
pathologic size criteria. There is no AAA.

Reproductive: No acute or focal abnormality.

Other: Small umbilical fat hernia. No free air, hemorrhage or fluid.

Musculoskeletal: No destructive regional bone lesions.
IMPRESSION: 1. Since [DATE], interval bilateral mastectomies, surgical drain
in place on the right, with subcutaneous stranding on both sides.
2. A few small air pockets in the anterior left chest wall deep
subcutaneous plane, possibly postsurgical in etiology. Infectious
process not excluded.
3. Thin walled fluid collections in the anterolateral left chest
wall and left axilla, possible postsurgical seromas or hematomas,
less likely infectious.
4. Stable slightly prominent bilateral hilar nodes, with stable
subcentimeter mediastinal nodes except for a right paratracheal node
which was previously 6.6 mm and is now 7.9 mm short axis.
5. 2 tiny stable left lung nodules. No new or enlarging nodule. Old
granulomatous disease.
6. Interval decreased size of previously slightly enlarged right
inguinal and right external iliac chain nodes.
7. Constipation.
8. Stable subcentimeter lucent lesion in the posteromedial left
first rib.

## 2021-10-01 MED ORDER — HEPARIN SOD (PORK) LOCK FLUSH 100 UNIT/ML IV SOLN
500.0000 [IU] | INTRAVENOUS | Status: AC | PRN
  Administered 2021-10-01: 11:00:00 500 [IU]
  Filled 2021-10-01: qty 5

## 2021-10-01 MED ORDER — IOHEXOL 300 MG/ML  SOLN
100.0000 mL | Freq: Once | INTRAMUSCULAR | Status: AC | PRN
  Administered 2021-10-01: 11:00:00 100 mL via INTRAVENOUS

## 2021-10-01 NOTE — Telephone Encounter (Signed)
Patient called reporting that she had her tissue expanders removed and she now has an infection. She states the Plastic surgeons office is not really responding to her calls so she is asking to see [facility]. Please advise

## 2021-10-01 NOTE — Telephone Encounter (Signed)
Call returned to patient and advise to call the surgeons office and not wait on a MyChart response. She agreed to do so

## 2021-10-07 NOTE — Progress Notes (Signed)
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  She presents today for postoperative follow-up.  Patient was last seen here in clinic on 09/30/2021.  At that time, exam was largely reassuring.  Her right-sided drain output had been fluctuating between 30 to 50 cc/day and she was complaining of right-sided chest wall pain.  Left-sided drain was normal, but output.  Left drain was removed without complication.  Doxycycline was prescribed given her new onset right-sided discomfort, fluctuating volumes, and culture report.  Plan was for her to return in 1 week for potential drain removal.  Today, patient is accompanied by her husband [name] at bedside.  She states she is doing quite well.  She is ready to have her drain removed.  I reviewed her log and she is consistently draining between 20 to 30 cc/day x 4 days.  Her drainage is clear, normal-appearing.  Not cloudy.  She does have some discomfort at the tube insertion site, but is otherwise doing quite well with improved right-sided chest wall discomfort.  She denies any fevers, redness, or other changes in interim health.  She spoke with her RT physician and they will be able to expedite her receiving radiation given wound improvement on left side.  Physical exam is reassuring.  No evidence concerning for infection or subcutaneous fluid collection.  JP drain was intact and functional, removed without difficulty.  Patient did proceed to vagal briefly feeling lightheaded and diaphoretic, but recovered soon thereafter after laying back and provided sips of water.  At this point, recommending topical Vaseline and scar cream.  She will proceed with  RT in approximately 10 days.  Her next appointment can be with Dr. Marla Roe to discuss options for reconstruction.  She will call the clinic should she develop any questions or concerns.

## 2021-10-08 ENCOUNTER — Other Ambulatory Visit: Payer: Self-pay

## 2021-10-08 ENCOUNTER — Encounter: Payer: Self-pay | Admitting: Licensed Clinical Social Worker

## 2021-10-08 ENCOUNTER — Inpatient Hospital Stay: Payer: 59

## 2021-10-08 ENCOUNTER — Ambulatory Visit
Admission: RE | Admit: 2021-10-08 | Discharge: 2021-10-08 | Disposition: A | Discharge status: Home / Self Care | Payer: 59 | Source: Ambulatory Visit | Attending: Radiation Oncology | Admitting: Radiation Oncology

## 2021-10-08 ENCOUNTER — Encounter: Payer: Self-pay | Admitting: Oncology

## 2021-10-08 ENCOUNTER — Inpatient Hospital Stay (HOSPITAL_BASED_OUTPATIENT_CLINIC_OR_DEPARTMENT_OTHER): Payer: 59 | Admitting: Oncology

## 2021-10-08 ENCOUNTER — Other Ambulatory Visit: Payer: Self-pay | Admitting: *Deleted

## 2021-10-08 VITALS — BP 118/69 | HR 89 | Temp 98.5°F | Resp 17 | Wt 194.0 lb

## 2021-10-08 DIAGNOSIS — Z5112 Encounter for antineoplastic immunotherapy: Secondary | ICD-10-CM

## 2021-10-08 DIAGNOSIS — C773 Secondary and unspecified malignant neoplasm of axilla and upper limb lymph nodes: Secondary | ICD-10-CM | POA: Insufficient documentation

## 2021-10-08 DIAGNOSIS — Z171 Estrogen receptor negative status [ER-]: Secondary | ICD-10-CM

## 2021-10-08 DIAGNOSIS — C50812 Malignant neoplasm of overlapping sites of left female breast: Secondary | ICD-10-CM | POA: Diagnosis present

## 2021-10-08 DIAGNOSIS — Z79899 Other long term (current) drug therapy: Secondary | ICD-10-CM | POA: Insufficient documentation

## 2021-10-08 DIAGNOSIS — Z809 Family history of malignant neoplasm, unspecified: Secondary | ICD-10-CM | POA: Insufficient documentation

## 2021-10-08 DIAGNOSIS — C50919 Malignant neoplasm of unspecified site of unspecified female breast: Secondary | ICD-10-CM

## 2021-10-08 DIAGNOSIS — T451X5A Adverse effect of antineoplastic and immunosuppressive drugs, initial encounter: Secondary | ICD-10-CM | POA: Insufficient documentation

## 2021-10-08 DIAGNOSIS — R7989 Other specified abnormal findings of blood chemistry: Secondary | ICD-10-CM | POA: Insufficient documentation

## 2021-10-08 DIAGNOSIS — C50412 Malignant neoplasm of upper-outer quadrant of left female breast: Secondary | ICD-10-CM | POA: Insufficient documentation

## 2021-10-08 DIAGNOSIS — Z9013 Acquired absence of bilateral breasts and nipples: Secondary | ICD-10-CM | POA: Insufficient documentation

## 2021-10-08 DIAGNOSIS — G620 Drug-induced polyneuropathy: Secondary | ICD-10-CM | POA: Insufficient documentation

## 2021-10-08 DIAGNOSIS — Z510 Encounter for antineoplastic radiation therapy: Secondary | ICD-10-CM | POA: Insufficient documentation

## 2021-10-08 LAB — CBC WITH DIFFERENTIAL/PLATELET
Abs Immature Granulocytes: 0.02 10*3/uL (ref 0.00–0.07)
Basophils Absolute: 0.1 10*3/uL (ref 0.0–0.1)
Basophils Relative: 1 %
Eosinophils Absolute: 1.7 10*3/uL — ABNORMAL HIGH (ref 0.0–0.5)
Eosinophils Relative: 25 %
HCT: 30.7 % — ABNORMAL LOW (ref 36.0–46.0)
Hemoglobin: 10 g/dL — ABNORMAL LOW (ref 12.0–15.0)
Immature Granulocytes: 0 %
Lymphocytes Relative: 18 %
Lymphs Abs: 1.2 10*3/uL (ref 0.7–4.0)
MCH: 26.9 pg (ref 26.0–34.0)
MCHC: 32.6 g/dL (ref 30.0–36.0)
MCV: 82.5 fL (ref 80.0–100.0)
Monocytes Absolute: 0.7 10*3/uL (ref 0.1–1.0)
Monocytes Relative: 11 %
Neutro Abs: 2.9 10*3/uL (ref 1.7–7.7)
Neutrophils Relative %: 45 %
Platelets: 323 10*3/uL (ref 150–400)
RBC: 3.72 MIL/uL — ABNORMAL LOW (ref 3.87–5.11)
RDW: 16.2 % — ABNORMAL HIGH (ref 11.5–15.5)
WBC: 6.7 10*3/uL (ref 4.0–10.5)
nRBC: 0 % (ref 0.0–0.2)

## 2021-10-08 LAB — COMPREHENSIVE METABOLIC PANEL
ALT: 121 U/L — ABNORMAL HIGH (ref 0–44)
AST: 100 U/L — ABNORMAL HIGH (ref 15–41)
Albumin: 3.9 g/dL (ref 3.5–5.0)
Alkaline Phosphatase: 102 U/L (ref 38–126)
Anion gap: 9 (ref 5–15)
BUN: 11 mg/dL (ref 6–20)
CO2: 22 mmol/L (ref 22–32)
Calcium: 8.9 mg/dL (ref 8.9–10.3)
Chloride: 102 mmol/L (ref 98–111)
Creatinine, Ser: 0.66 mg/dL (ref 0.44–1.00)
GFR, Estimated: >60 mL/min (ref >60)
Glucose, Bld: 102 mg/dL — ABNORMAL HIGH (ref 70–99)
Potassium: 3.6 mmol/L (ref 3.5–5.1)
Sodium: 133 mmol/L — ABNORMAL LOW (ref 135–145)
Total Bilirubin: 0.3 mg/dL (ref 0.3–1.2)
Total Protein: 7.4 g/dL (ref 6.5–8.1)

## 2021-10-08 MED ORDER — SODIUM CHLORIDE 0.9% FLUSH
10.0000 mL | Freq: Once | INTRAVENOUS | Status: AC
  Administered 2021-10-08: 10 mL via INTRAVENOUS
  Filled 2021-10-08: qty 10

## 2021-10-08 MED ORDER — SODIUM CHLORIDE 0.9 % IV SOLN
Freq: Once | INTRAVENOUS | Status: AC
  Filled 2021-10-08: qty 250

## 2021-10-08 MED ORDER — HEPARIN SOD (PORK) LOCK FLUSH 100 UNIT/ML IV SOLN
INTRAVENOUS | Status: AC
  Administered 2021-10-08: 500 [IU] via INTRAVENOUS
  Filled 2021-10-08: qty 5

## 2021-10-08 MED ORDER — SODIUM CHLORIDE 0.9 % IV SOLN
200.0000 mg | Freq: Once | INTRAVENOUS | Status: AC
  Administered 2021-10-08: 200 mg via INTRAVENOUS
  Filled 2021-10-08: qty 8

## 2021-10-08 MED ORDER — HEPARIN SOD (PORK) LOCK FLUSH 100 UNIT/ML IV SOLN
500.0000 [IU] | Freq: Once | INTRAVENOUS | Status: AC
  Filled 2021-10-08: qty 5

## 2021-10-08 NOTE — Progress Notes (Signed)
Radiation Oncology Follow up Note  Name: Vickie Mathis   Date:   10/08/2021 MRN:  [id number] DOB: August 05, 1991    This 31 y.o. 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  REFERRING PROVIDER: Juluis Pitch, MD  HPI: Patient was originally consulted back in October with recommendation for left chest wall and peripheral lymphatic radiation therapy.  This has been delayed secondary to skin complication she has had her expanders removed bilaterally.  She is seen today for for evaluation to proceed with simulation and treatment.  She is otherwise doing well has a small area of minor ulceration superior to the scar..  COMPLICATIONS OF TREATMENT: none  FOLLOW UP COMPLIANCE: keeps appointments   PHYSICAL EXAM:  There were no vitals taken for this visit. Patient has had both expanders removed.  Both incisions are healing well still small area of erythematous slight ulceration and superior to the scar although I believe we can proceed with simulation and treatment.  Well-developed well-nourished patient in NAD. HEENT reveals PERLA, EOMI, discs not visualized.  Oral cavity is clear. No oral mucosal lesions are identified. Neck is clear without evidence of cervical or supraclavicular adenopathy. Lungs are clear to A&P. Cardiac examination is essentially unremarkable with regular rate and rhythm without murmur rub or thrill. Abdomen is benign with no organomegaly or masses noted. Motor sensory and DTR levels are equal and symmetric in the upper and lower extremities. Cranial nerves II through XII are grossly intact. Proprioception is intact. No peripheral adenopathy or edema is identified. No motor or sensory levels are noted. Crude visual fields are within normal range.  RADIOLOGY RESULTS: No  current films for review  PLAN: I have set up simulation for early next week and will proceed to treatment.  We will continue to keep a close eye on her skin at this time.  Glad we are able to proceed with treatment at this time.  Patient and husband both comprehend my recommendations well.  I would like to take this opportunity to thank you for allowing me to participate in the care of your patient.Noreene Filbert, MD

## 2021-10-08 NOTE — Patient Instructions (Signed)
[facility]  Discharge Instructions: °Thank you for choosing [facility] to provide your oncology and hematology care.  °If you have a lab appointment with the Cancer Center, please go directly to the Cancer Center and check in at the registration area. ° °Wear comfortable clothing and clothing appropriate for easy access to any Portacath or PICC line.  ° °We strive to give you quality time with your provider. You may need to reschedule your appointment if you arrive late (15 or more minutes).  Arriving late affects you and other patients whose appointments are after yours.  Also, if you miss three or more appointments without notifying the office, you may be dismissed from the clinic at the provider’s discretion.    °  °For prescription refill requests, have your pharmacy contact our office and allow 72 hours for refills to be completed.   ° °Today you received the following chemotherapy and/or immunotherapy agents - pembrolizumab    °  °To help prevent nausea and vomiting after your treatment, we encourage you to take your nausea medication as directed. ° °BELOW ARE SYMPTOMS THAT SHOULD BE REPORTED IMMEDIATELY: °*FEVER GREATER THAN 100.4 F (38 °C) OR HIGHER °*CHILLS OR SWEATING °*NAUSEA AND VOMITING THAT IS NOT CONTROLLED WITH YOUR NAUSEA MEDICATION °*UNUSUAL SHORTNESS OF BREATH °*UNUSUAL BRUISING OR BLEEDING °*URINARY PROBLEMS (pain or burning when urinating, or frequent urination) °*BOWEL PROBLEMS (unusual diarrhea, constipation, pain near the anus) °TENDERNESS IN MOUTH AND THROAT WITH OR WITHOUT PRESENCE OF ULCERS (sore throat, sores in mouth, or a toothache) °UNUSUAL RASH, SWELLING OR PAIN  °UNUSUAL VAGINAL DISCHARGE OR ITCHING  ° °Items with * indicate a potential emergency and should be followed up as soon as possible or go to the Emergency Department if any problems should occur. ° °Please show the CHEMOTHERAPY ALERT CARD or IMMUNOTHERAPY ALERT CARD at  check-in to the Emergency Department and triage nurse. ° °Should you have questions after your visit or need to cancel or reschedule your appointment, please contact [facility]  [phone] and follow the prompts.  Office hours are 8:00 a.m. to 4:30 p.m. Monday - Friday. Please note that voicemails left after 4:00 p.m. may not be returned until the following business day.  We are closed weekends and major holidays. You have access to a nurse at all times for urgent questions. Please call the main number to the clinic [phone] and follow the prompts. ° °For any non-urgent questions, you may also contact your provider using MyChart. We now offer e-Visits for anyone 18 and older to request care online for non-urgent symptoms. For details visit [email or web address]. °  °Also download the MyChart app! Go to the app store, search "MyChart", open the app, select [facility], and log in with your MyChart username and password. ° °Due to Covid, a mask is required upon entering the hospital/clinic. If you do not have a mask, one will be given to you upon arrival. For doctor visits, patients may have 1 support person aged 18 or older with them. For treatment visits, patients cannot have anyone with them due to current Covid guidelines and our immunocompromised population.  ° °Pembrolizumab injection °What is this medication? °PEMBROLIZUMAB (pem broe liz ue mab) is a monoclonal antibody. It is used to treat certain types of cancer. °This medicine may be used for other purposes; ask your health care provider or pharmacist if you have questions. °COMMON BRAND NAME(S): Keytruda °What should I tell my care   team before I take this medication? °They need to know if you have any of these conditions: °autoimmune diseases like Crohn's disease, ulcerative colitis, or lupus °have had or planning to have an allogeneic stem cell transplant (uses someone else's stem cells) °history of organ  transplant °history of chest radiation °nervous system problems like myasthenia gravis or Guillain-Barre syndrome °an unusual or allergic reaction to pembrolizumab, other medicines, foods, dyes, or preservatives °pregnant or trying to get pregnant °breast-feeding °How should I use this medication? °This medicine is for infusion into a vein. It is given by a health care professional in a hospital or clinic setting. °A special MedGuide will be given to you before each treatment. Be sure to read this information carefully each time. °Talk to your pediatrician regarding the use of this medicine in children. While this drug may be prescribed for children as young as 6 months for selected conditions, precautions do apply. °Overdosage: If you think you have taken too much of this medicine contact a poison control center or emergency room at once. °NOTE: This medicine is only for you. Do not share this medicine with others. °What if I miss a dose? °It is important not to miss your dose. Call your doctor or health care professional if you are unable to keep an appointment. °What may interact with this medication? °Interactions have not been studied. °This list may not describe all possible interactions. Give your health care provider a list of all the medicines, herbs, non-prescription drugs, or dietary supplements you use. Also tell them if you smoke, drink alcohol, or use illegal drugs. Some items may interact with your medicine. °What should I watch for while using this medication? °Your condition will be monitored carefully while you are receiving this medicine. °You may need blood work done while you are taking this medicine. °Do not become pregnant while taking this medicine or for 4 months after stopping it. Women should inform their doctor if they wish to become pregnant or think they might be pregnant. There is a potential for serious side effects to an unborn child. Talk to your health care professional or  pharmacist for more information. Do not breast-feed an infant while taking this medicine or for 4 months after the last dose. °What side effects may I notice from receiving this medication? °Side effects that you should report to your doctor or health care professional as soon as possible: °allergic reactions like skin rash, itching or hives, swelling of the face, lips, or tongue °bloody or black, tarry °breathing problems °changes in vision °chest pain °chills °confusion °constipation °cough °diarrhea °dizziness or feeling faint or lightheaded °fast or irregular heartbeat °fever °flushing °joint pain °low blood counts - this medicine may decrease the number of white blood cells, red blood cells and platelets. You may be at increased risk for infections and bleeding. °muscle pain °muscle weakness °pain, tingling, numbness in the hands or feet °persistent headache °redness, blistering, peeling or loosening of the skin, including inside the mouth °signs and symptoms of high blood sugar such as dizziness; dry mouth; dry skin; fruity breath; nausea; stomach pain; increased hunger or thirst; increased urination °signs and symptoms of kidney injury like trouble passing urine or change in the amount of urine °signs and symptoms of liver injury like dark urine, light-colored stools, loss of appetite, nausea, right upper belly pain, yellowing of the eyes or skin °sweating °swollen lymph nodes °weight loss °Side effects that usually do not require medical attention (report to your doctor   or health care professional if they continue or are bothersome): °decreased appetite °hair loss °tiredness °This list may not describe all possible side effects. Call your doctor for medical advice about side effects. You may report side effects to FDA at 1-800-FDA-1088. °Where should I keep my medication? °This drug is given in a hospital or clinic and will not be stored at home. °NOTE: This sheet is a summary. It may not cover all possible  information. If you have questions about this medicine, talk to your doctor, pharmacist, or health care provider. °© 2022 [employer] (2021-06-10 00:00:00) ° °

## 2021-10-08 NOTE — Progress Notes (Signed)
Hematology/Oncology Consult note [facility]  Telephone:([phone] [phone]  Patient Care Team: Juluis Pitch, MD as PCP - General (Family Medicine) Sindy Guadeloupe, MD as Consulting Physician (Hematology and Oncology)   Name of the patient: Vickie Mathis  [id number]  Apr 16, 1991   Date of visit: 10/08/21  Diagnosis- locally advanced 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 breast cancer at least T2 N1 M0  Chief complaint/ Reason for visit-on treatment assessment prior to cycle 4 of adjuvant Keytruda  Heme/Onc history: patient is a 31 year old female who self palpated a left breast mass and sought medical attention recently after thinking it was a possible cyst for all this while.She underwent a diagnostic bilateral mammogram and ultrasound which showed a 2.7 cm mass at the 12 o'clock position 6 cm from the nipple.  Ultrasound of the left axilla demonstrates 2 lymph nodes with mild thickened cortices of 4 mm.  There is skin thickening on the lower inner quadrant of the left breast on mammography.  Both the breast mass and the lymph node were biopsied and was positive for invasive mammary carcinoma grade 3.  Lymph node was also suspicious for extracapsular extension.    ER/PR and HER-2 negative   MRI showed irregular enhancing mass at the 12 o'clock position of the left breast measuring 2.5 x 2.3 cm and a linear component extending 2.3 cm anteriorly.  The mass in the anterior linear extension combined measure 4.3 cm.  3.9 cm in the cephalocaudal dimension.  Also an area of clumped non-mass enhancement in the posterior aspect of the lower quadrant of the left breast measuring 2.6 x 0.9 cm which looks suspicious.  2 enlarged left axillary lymph nodes and 2 mildly enlarged internal mammary lymph nodes.  4.3 cm clumped area of non-mass enhancement in the outer quadrant of the right breast.  3 right axillary lymph nodes with mild cortical thickening.   Patient underwent biopsy  of the right breast non-mass enhancement and that was negative for malignancy   CT scan showed mildly enlarged right inguinal lymph nodes nonspecific.  Left upper breast mass with prominent left axillary lymph nodes.  Subcentimeter pulmonary nodules which are calcified and compatible with benign old granulomatous disease.  0.6 5.4 cm lucent lesion in the left first rib possibly a hemangioma or benign lesion.   Patient developed significant infusion reaction to carboplatin with dose 9 when her blood pressure dropped and she became tachycardic and significantly nauseous.  She went on to complete [facility] Keytruda chemotherapy as per keynote 522 regimen.  Patient underwent bilateral mastectomy with reconstruction in September 2022.  Final pathology showed complete pathological response2 sentinel lymph nodes negative for malignancy.  No malignancy noted in the right breast.     Interval history-patient had bilateral expanders removed after concern for infection.  She still has a right breast drain in place.  She also finished another course of antibiotic for chest wall infection about a week ago.  ECOG PS- 1 Pain scale- 3 Opioid associated constipation- no  Review of systems- Review of Systems  Constitutional:  Positive for malaise/fatigue. Negative for chills, fever and weight loss.  HENT:  Negative for congestion, ear discharge and nosebleeds.   Eyes:  Negative for blurred vision.  Respiratory:  Negative for cough, hemoptysis, sputum production, shortness of breath and wheezing.   Cardiovascular:  Negative for chest pain, palpitations, orthopnea and claudication.  Gastrointestinal:  Negative for abdominal pain, blood in stool, constipation, diarrhea, heartburn, melena, nausea and vomiting.  Genitourinary:  Negative for dysuria, flank pain, frequency, hematuria and urgency.  Musculoskeletal:  Negative for back pain, joint pain and myalgias.  Skin:  Negative for rash.  Neurological:  Negative for  dizziness, tingling, focal weakness, seizures, weakness and headaches.  Endo/Heme/Allergies:  Does not bruise/bleed easily.  Psychiatric/Behavioral:  Negative for depression and suicidal ideas. The patient does not have insomnia.      Allergies  Allergen Reactions   Carboplatin Shortness Of Breath, Nausea And Vomiting and Other (See Comments)    Flushing- chest , face, neck , arm including hand   Amoxicillin     Other reaction(s): "too young to remember what they do to me"   Sulfa Antibiotics     Other reaction(s): "too young to remember what they do to me"     Past Medical History:  Diagnosis Date   Anxiety    Asthma    Breast cancer ([place]) 11/2020   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 breast ca   Depression    Family history of cancer    History of chemotherapy    Varicose veins of bilateral lower extremities with pain      Past Surgical History:  Procedure Laterality Date   APPENDECTOMY  2018   BILATERAL TOTAL MASTECTOMY WITH AXILLARY LYMPH NODE DISSECTION Bilateral 06/25/2021   Procedure: BILATERAL TOTAL MASTECTOMY WITH LEFT AXILLARY LYMPH NODE BIOPSY VS. AXILLARY NODE DISSECTION;  Surgeon: Herbert Pun, MD;  Location: [facility];  Service: General;  Laterality: Bilateral;  [place], 1.5 hours Cintron-Diaz 2.5 hours   BREAST BIOPSY Left 11/26/2020   vision 12:00 6cmfn [facility]   BREAST BIOPSY Left 11/26/2020   LN bx, hydro marker,  fragments of macrometastatic carcinoma   BREAST RECONSTRUCTION WITH PLACEMENT OF TISSUE EXPANDER AND FLEX HD (ACELLULAR HYDRATED DERMIS) Bilateral 06/25/2021   Procedure: BREAST RECONSTRUCTION WITH PLACEMENT OF TISSUE EXPANDER AND FLEX HD (ACELLULAR HYDRATED DERMIS);  Surgeon: Wallace Going, DO;  Location: [facility];  Service: Plastics;  Laterality: Bilateral;   PORTACATH PLACEMENT Right 12/13/2020   Procedure: INSERTION PORT-A-CATH;  Surgeon: Herbert Pun, MD;  Location: [facility];  Service: General;  Laterality: Right;   REMOVAL OF BILATERAL  TISSUE EXPANDERS WITH PLACEMENT OF BILATERAL BREAST IMPLANTS Bilateral 09/15/2021   Procedure: REMOVAL OF BILATERAL TISSUE EXPANDERS;  Surgeon: Wallace Going, DO;  Location: [place];  Service: Plastics;  Laterality: Bilateral;    Social History   Socioeconomic History   Marital status: Married    Spouse name: Not on file   Number of children: Not on file   Years of education: Not on file   Highest education level: Not on file  Occupational History   Not on file  Tobacco Use   Smoking status: Never   Smokeless tobacco: Never  Vaping Use   Vaping Use: Never used  Substance and Sexual Activity   Alcohol use: Not Currently    Comment: occassinally    Drug use: Yes    Types: Marijuana    Comment: occ   Sexual activity: Yes  Other Topics Concern   Not on file  Social History Narrative   Not on file   Social Determinants of Health   Financial Resource Strain: Not on file  Food Insecurity: Not on file  Transportation Needs: Not on file  Physical Activity: Not on file  Stress: Not on file  Social Connections: Not on file  Intimate Partner Violence: Not on file    Family History  Problem Relation Age of Onset  Diabetes Father    Varicose Veins Father    Diabetes Paternal Aunt    Uterine cancer Paternal Aunt        precancerous   Diabetes Paternal Grandmother    Uterine cancer Paternal Grandmother        precancerous   Thyroid disease Mother    Thyroid disease Maternal Aunt    Thyroid disease Maternal Grandmother    Cancer Other        stomach vs ovarian/cervical   Cancer Paternal Great-grandmother        unk     Current Outpatient Medications:    busPIRone (BUSPAR) 15 MG tablet, Take 15 mg by mouth 2 (two) times daily., Disp: , Rfl:    busPIRone (BUSPAR) 15 MG tablet, Take 1 tablet by mouth 2 (two) times daily., Disp: , Rfl:    citalopram (CELEXA) 40 MG tablet, Take 1 tablet (40 mg total) by mouth daily. (Patient taking differently:  Take 40 mg by mouth at bedtime.), Disp: 90 tablet, Rfl: 2   clonazePAM (KLONOPIN) 0.5 MG tablet, TAKE 1 TABLET BY MOUTH 3 TIMES DAILY AS NEEDED., Disp: 60 tablet, Rfl: 0   Cyanocobalamin (B-12 PO), Take 1 tablet by mouth daily., Disp: , Rfl:    diazepam (VALIUM) 2 MG tablet, Take 1 tablet (2 mg total) by mouth every 12 (twelve) hours as needed for muscle spasms., Disp: 20 tablet, Rfl: 0   docusate sodium (COLACE) 100 MG capsule, Take 100 mg by mouth daily as needed for mild constipation., Disp: , Rfl:    folic acid (FOLVITE) 1 MG tablet, TAKE 2 TABLETS BY MOUTH EVERY DAY, Disp: 180 tablet, Rfl: 0   gabapentin (NEURONTIN) 300 MG capsule, TAKE 1 CAPSULE (300 MG TOTAL) BY MOUTH 3 (THREE) TIMES DAILY FOR 14 DAYS., Disp: 42 capsule, Rfl: 0   hydrOXYzine (VISTARIL) 25 MG capsule, Take 1 capsule (25 mg total) by mouth at bedtime as needed for up to 20 doses., Disp: 20 capsule, Rfl: 0   ibuprofen (ADVIL) 200 MG tablet, Take 400 mg by mouth every 6 (six) hours as needed for moderate pain., Disp: , Rfl:    lidocaine-prilocaine (EMLA) cream, Apply 1 application topically daily as needed (prior to port access)., Disp: 30 g, Rfl: 3   methocarbamol (ROBAXIN) 500 MG tablet, TAKE 1 TABLET BY MOUTH 3 TIMES DAILY FOR 14 DAYS., Disp: 42 tablet, Rfl: 0   Multiple Vitamin (MULTIVITAMIN WITH MINERALS) TABS tablet, Take 1 tablet by mouth daily., Disp: , Rfl:    OLANZapine (ZYPREXA) 10 MG tablet, TAKE 1 TABLET BY MOUTH EVERYDAY AT BEDTIME, Disp: 90 tablet, Rfl: 1   ondansetron (ZOFRAN ODT) 4 MG disintegrating tablet, Take 1 tablet (4 mg total) by mouth every 8 (eight) hours as needed for nausea or vomiting., Disp: 20 tablet, Rfl: 0   ondansetron (ZOFRAN) 8 MG tablet, Take 1 tablet (8 mg total) by mouth every 8 (eight) hours as needed for refractory nausea / vomiting. Start on day 3 after chemo., Disp: 60 tablet, Rfl: 1   ondansetron (ZOFRAN-ODT) 4 MG disintegrating tablet, Take 1 tablet (4 mg total) by mouth every 8 (eight)  hours as needed for nausea or vomiting., Disp: 20 tablet, Rfl: 0   oxyCODONE (OXY IR/ROXICODONE) 5 MG immediate release tablet, Take 1 tablet (5 mg total) by mouth every 4 (four) hours as needed for severe pain., Disp: 30 tablet, Rfl: 0   potassium chloride SA (KLOR-CON M) 20 MEQ tablet, Take 1 tablet (20 mEq total) by mouth  daily., Disp: 7 tablet, Rfl: 0   prochlorperazine (COMPAZINE) 10 MG tablet, Take 1 tablet (10 mg total) by mouth every 6 (six) hours as needed (Nausea or vomiting)., Disp: 30 tablet, Rfl: 1   traMADol (ULTRAM) 50 MG tablet, Take 1 tablet (50 mg total) by mouth every 8 (eight) hours as needed for up to 5 doses., Disp: 5 tablet, Rfl: 0   triamcinolone ointment (KENALOG) 0.5 %, Apply 1 application topically 2 (two) times daily., Disp: 30 g, Rfl: 0   TURMERIC CURCUMIN PO, Take 2 capsules by mouth daily., Disp: , Rfl:  No current facility-administered medications for this visit.  Facility-Administered Medications Ordered in Other Visits:    heparin lock flush 100 unit/mL, 500 Units, Intravenous, Once, Brahmanday, Govinda R, MD   prochlorperazine (COMPAZINE) tablet 10 mg, 10 mg, Oral, Q6H PRN, Sindy Guadeloupe, MD, 10 mg at 07/16/21 1407  Physical exam:  Vitals:   10/08/21 1106  BP: 118/69  Pulse: 89  Resp: 17  Temp: 98.5 F (36.9 C)  TempSrc: Tympanic  SpO2: 98%  Weight: 194 lb (88 kg)   Physical Exam Constitutional:      General: She is not in acute distress. Cardiovascular:     Rate and Rhythm: Normal rate and regular rhythm.     Heart sounds: Normal heart sounds.  Pulmonary:     Effort: Pulmonary effort is normal.     Breath sounds: Normal breath sounds.  Abdominal:     General: Bowel sounds are normal.     Palpations: Abdomen is soft.  Skin:    General: Skin is warm and dry.  Neurological:     Mental Status: She is alert and oriented to person, place, and time.  Patient is s/p bilateral mastectomy.  Expanders have been removed and patient has a right drain  in place  CMP Latest Ref Rng & Units 10/08/2021  Glucose 70 - 99 mg/dL 102(H)  BUN 6 - 20 mg/dL 11  Creatinine 0.44 - 1.00 mg/dL 0.66  Sodium 135 - 145 mmol/L 133(L)  Potassium 3.5 - 5.1 mmol/L 3.6  Chloride 98 - 111 mmol/L 102  CO2 22 - 32 mmol/L 22  Calcium 8.9 - 10.3 mg/dL 8.9  Total Protein 6.5 - 8.1 g/dL 7.4  Total Bilirubin 0.3 - 1.2 mg/dL 0.3  Alkaline Phos 38 - 126 U/L 102  AST 15 - 41 U/L 100(H)  ALT 0 - 44 U/L 121(H)   CBC Latest Ref Rng & Units 10/08/2021  WBC 4.0 - 10.5 K/uL 6.7  Hemoglobin 12.0 - 15.0 g/dL 10.0(L)  Hematocrit 36.0 - 46.0 % 30.7(L)  Platelets 150 - 400 K/uL 323    No images are attached to the encounter.  CT CHEST ABDOMEN PELVIS W CONTRAST  Result Date: 10/02/2021 CLINICAL DATA:  Breast cancer follow-up after treatment. Previous history left breast cancer and mastectomy, currently with a drain in the right breast. EXAM: CT CHEST, ABDOMEN, AND PELVIS WITH CONTRAST TECHNIQUE: Multidetector CT imaging of the chest, abdomen and pelvis was performed following the standard protocol during bolus administration of intravenous contrast. CONTRAST:  181m OMNIPAQUE IOHEXOL 300 MG/ML  SOLN COMPARISON:  Chest, abdomen and pelvis CT with contrast 12/16/2020 FINDINGS: CT CHEST FINDINGS Cardiovascular: Cardiac size, central pulmonary arteries and aorta are normal. There is no pericardial effusion venous distention. No aortic atherosclerosis. Right IJ port catheter tip remains in the SVC. Mediastinum/Nodes: There are small right axillary lymph nodes which are unchanged a few tiny left axillary nodes also unchanged. There  previously were mildly prominent left axillary nodes which appear to have been removed. There is thin walled left axillary fluid collection measuring 2.3 cm and -1.0 Hounsfield units which probably a postoperative seroma. There are few slightly prominent bilateral hilar nodes again measuring up to 1.1 cm in short axis on both sides. Right paratracheal lymph node  on series 2 axial 29 measures 7.9 mm, previously 6.6 mm. There are small stable AP window lymph nodes up to 7.9 mm in short axis, similar sized subcarinal lymph nodes also unchanged. The lower poles of the thyroid gland and esophagus are unremarkable and there is no further adenopathy. Lungs/Pleura: There are calcified granulomas again noted in the superior segments of both lower lobes. There is a calcified granuloma posteriorly in the left upper lobe at the level of the carina. There is a 3 mm stable noncalcified nodule anteriorly in the left upper lobe on series 4 axial 37 and a 3.5 mm stable noncalcified left lower lobe nodule on series 4 axial 28. The trachea, central airways remaining lungs are clear. There is no pleural effusion or pneumothorax. Musculoskeletal: Interval bilateral mastectomies. Surgical drain is in place in the right chest wall. Underlying the left mastectomy there is a subcutaneous rim enhancing fluid collection extending along the anterolateral chest wall and measuring 5.3 cm AP, 0.9 cm coronal and up to 11 cm craniocaudal, probable seroma versus hematoma, less likely abscess. There were there are scattered air pockets in the deep subcutaneous plane over the anterior chest wall on the left, and as above, a 2.3 cm thin walled fluid collection in the left axilla, subcutaneous stranding underlying the mastectomy sites on both sides. There is a stable 6.9 x 5.4 mm lucent lesion in the posteromedial left first rib, on image 7 of series 4. No other focal regional bone lesion is seen. CT ABDOMEN PELVIS FINDINGS Hepatobiliary: There is no mass enhancement. Mild hepatic steatosis. Unremarkable gallbladder common bile ducts. Pancreas: No mass or ductal dilatation. Mild tail segmental atrophy. Spleen: Normal. Adrenals/Urinary Tract: No mass enhancement, hydronephrosis or calculus, or bladder thickening. Stomach/Bowel: No dilatation or wall thickening. An appendix is not seen in this patient. There is  moderate fecal stasis. Vascular/Lymphatic: There previously were mildly prominent right external iliac chain and right inguinal lymph nodes which are no longer prominent. Small subcentimeter mesenteric root lymph nodes are unchanged. There are no enlarged abdominal or pelvic nodes by pathologic size criteria. There is no AAA. Reproductive: No acute or focal abnormality. Other: Small umbilical fat hernia. No free air, hemorrhage or fluid. Musculoskeletal: No destructive regional bone lesions. IMPRESSION: 1. Since 12/16/2020, interval bilateral mastectomies, surgical drain in place on the right, with subcutaneous stranding on both sides. 2. A few small air pockets in the anterior left chest wall deep subcutaneous plane, possibly postsurgical in etiology. Infectious process not excluded. 3. Thin walled fluid collections in the anterolateral left chest wall and left axilla, possible postsurgical seromas or hematomas, less likely infectious. 4. Stable slightly prominent bilateral hilar nodes, with stable subcentimeter mediastinal nodes except for a right paratracheal node which was previously 6.6 mm and is now 7.9 mm short axis. 5. 2 tiny stable left lung nodules. No new or enlarging nodule. Old granulomatous disease. 6. Interval decreased size of previously slightly enlarged right inguinal and right external iliac chain nodes. 7. Constipation. 8. Stable subcentimeter lucent lesion in the posteromedial left first rib. Electronically Signed   By: Telford Nab M.D.   On: 10/02/2021 02:56  Assessment and plan- Patient is a 31 y.o. female with stage IIIb triple negative breast cancer of the left breast cT2 N1 M0.  She is s/p neoadjuvant chemotherapy as per keynote 522 protocol followed by complete pathological response.  She is here for on treatment assessment prior to cycle 4 of adjuvant Keytruda  Counts okay to proceed with cycle 4 of adjuvant Keytruda today.  He does have abnormal AST ALT that is elevated at 100   21 respectively.  3 weeks ago her AST and ALT were normal.  She has had a one-time elevated value which has been similar in the past and then it normalized on its own.  For now my plan is to continue monitoring her liver functions and proceeding with Keytruda today.  I will see her back in 3 weeks for cycle 5 of Keytruda.  Patient will likely start adjuvant radiation treatment in 2 weeks which has been on hold due to ongoing chest wall infection.  I have reviewed CT chest abdomen pelvis images independently and discussed findings with the patient.Patient has mild prominent bilateral hilar lymph nodes up to 1.1 cm.  1 paratracheal lymph node which was previously 6.6 mm now 7.9 mm.  This could be reactive as well especially given recent chest wall infection right inguinal and external iliac lymph nodes which were previously appearing enlarged have now decreased in size.  Overtly do not see any evidence of recurrent disease at this time.  My plan is to repeat a CT scan about 6 months from now.   Visit Diagnosis 1. Encounter for antineoplastic immunotherapy   2. Abnormal LFTs   3. Invasive carcinoma of breast ([place])      Dr. Randa Evens, MD, MPH [facility] at [facility] [phone] 10/08/2021 12:58 PM

## 2021-10-08 NOTE — Progress Notes (Signed)
Elevated LFTs.  Ok to proceed per MD

## 2021-10-08 NOTE — Addendum Note (Signed)
Addended by: Luella Cook on: 10/08/2021 01:25 PM   Modules accepted: Orders

## 2021-10-08 NOTE — Progress Notes (Signed)
[place] Work  Clinical Social Work was referred by dr. Tyson Alias for assessment of psychosocial needs.  Clinical Social Worker contacted patient by phone  to offer support and assess for needs.  Patient stated she wanted her spouse to be available for the conversation and agreed to schedule a telephone meeting for 10/09/2020 at 4:30 PM.      Adelene Amas, [place]

## 2021-10-08 NOTE — Progress Notes (Signed)
Nutrition Follow-up:  Patient with 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 breast cancer.  Patient receiving keytruda  Met with patient during infusion for follow-up.  Patient reports that appetite is good, overall. Does have some issues with nausea at times.  Says that she has gained some weight.  Wants to start exercising.  Noted surgery complications (infections, drain still in place on right side).    Planning to start radiation in 2 weeks  Medications: reviewed  Labs: reviewed  Anthropometrics:   194 lb today  202 lb on 12/14 198 lb on 11/23 195 lb on 11/2  NUTRITION DIAGNOSIS:  Inadequate oral intake improved   INTERVENTION:  Reviewed AICR guidelines for healthy diet for breast cancer patients.  Handout provided. Contact information provided Encouraged patient to include good sources of protein for wound healing     MONITORING, EVALUATION, GOAL: weight trends, intake   NEXT VISIT: as needed  Rashaunda Rahl B. Zenia Resides, [place], [place] Registered Dietitian [phone] (mobile)'

## 2021-10-08 NOTE — Progress Notes (Signed)
Patient here for oncology follow-up appointment, concerns of occasional SOB and constipation

## 2021-10-09 ENCOUNTER — Encounter: Payer: Self-pay | Admitting: Licensed Clinical Social Worker

## 2021-10-09 ENCOUNTER — Ambulatory Visit (INDEPENDENT_AMBULATORY_CARE_PROVIDER_SITE_OTHER): Payer: 59 | Admitting: Physician Assistant

## 2021-10-09 DIAGNOSIS — Z9889 Other specified postprocedural states: Secondary | ICD-10-CM

## 2021-10-09 NOTE — Progress Notes (Signed)
[place] Work  Initial Assessment   Vickie Mathis is a 31 y.o. year old female contacted by phone. Clinical Social Work was referred by Attending [facility] for assessment of psychosocial needs.   SDOH (Social Determinants of Health) assessments performed: No   Distress Screen completed: No No flowsheet data found.    Family/Social Information:  Housing Arrangement: patient lives with spouse Vickie Mathis (Spouse)  [phone] Family members/support persons in your life? Family Transportation concerns: no  Employment: Out of work due to pain. Income source: No income Financial concerns: Yes, due to illness and/or loss of work during treatment Type of concern:  Financial, due to inability to work.   Food access concerns: no Religious or spiritual practice: N/A Medication Concerns: no  Services Currently in place:  N/A  Coping/ Adjustment to diagnosis: Patient understands treatment plan and what happens next? 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  Clinical Social Work Clinical Goal(s):  patient will follow up with human resources at her work to discuss ADA accommodations and union repersentative* as directed by SW  Interventions: Discussed common feeling and emotions when being diagnosed with cancer, and the importance of support during treatment Informed patient of the support team roles and support services at [facility] Provided [place] contact information and encouraged patient to call with any questions or concerns Referred patient to [employer], Tour manager   Follow Up Plan: CSW will follow-up with patient by phone  Patient  verbalizes understanding of plan: Yes    [employer] , LCSW

## 2021-10-12 ENCOUNTER — Other Ambulatory Visit: Payer: Self-pay | Admitting: Oncology

## 2021-10-12 ENCOUNTER — Encounter: Payer: Self-pay | Admitting: Plastic Surgery

## 2021-10-12 MED ORDER — DOXYCYCLINE HYCLATE 100 MG PO TABS: 100.0000 mg | ORAL_TABLET | Freq: Two times a day (BID) | ORAL | 0 refills | Status: DC

## 2021-10-12 MED ORDER — CLONAZEPAM 0.5 MG PO TABS: 0.5000 mg | ORAL_TABLET | Freq: Three times a day (TID) | ORAL | 0 refills | Status: DC | PRN

## 2021-10-12 MED ORDER — ONDANSETRON 4 MG PO TBDP: 4.0000 mg | ORAL_TABLET | Freq: Three times a day (TID) | ORAL | 2 refills | Status: DC | PRN

## 2021-10-13 ENCOUNTER — Telehealth: Payer: Self-pay

## 2021-10-13 ENCOUNTER — Ambulatory Visit
Admission: RE | Admit: 2021-10-13 | Discharge: 2021-10-13 | Disposition: A | Discharge status: Home / Self Care | Payer: 59 | Source: Ambulatory Visit | Attending: Radiation Oncology | Admitting: Radiation Oncology

## 2021-10-13 ENCOUNTER — Inpatient Hospital Stay: Payer: 59

## 2021-10-13 ENCOUNTER — Other Ambulatory Visit: Payer: Self-pay

## 2021-10-13 NOTE — Telephone Encounter (Signed)
Called pt to check how she was doing this morning. Pt reports that she has started the doxycycline and had temp of 99 this morning. She has not heard back from her surgeon's office yet, but she will call them today. I offered an appointment in the cancer center, in case surgeon was unable to see her. Pt stated that she will call surgeon's office first, and if they are unable to see her, she will reach back out to [place].

## 2021-10-15 DIAGNOSIS — Z510 Encounter for antineoplastic radiation therapy: Secondary | ICD-10-CM | POA: Diagnosis not present

## 2021-10-16 ENCOUNTER — Other Ambulatory Visit: Payer: Self-pay | Admitting: Physician Assistant

## 2021-10-17 ENCOUNTER — Other Ambulatory Visit: Payer: Self-pay | Admitting: *Deleted

## 2021-10-17 DIAGNOSIS — Z171 Estrogen receptor negative status [ER-]: Secondary | ICD-10-CM

## 2021-10-17 DIAGNOSIS — C50412 Malignant neoplasm of upper-outer quadrant of left female breast: Secondary | ICD-10-CM

## 2021-10-20 ENCOUNTER — Ambulatory Visit: Admission: RE | Admit: 2021-10-20 | Payer: 59 | Source: Ambulatory Visit

## 2021-10-20 ENCOUNTER — Inpatient Hospital Stay: Payer: 59

## 2021-10-20 ENCOUNTER — Other Ambulatory Visit: Payer: Self-pay

## 2021-10-21 ENCOUNTER — Ambulatory Visit
Admission: RE | Admit: 2021-10-21 | Discharge: 2021-10-21 | Disposition: A | Discharge status: Home / Self Care | Payer: 59 | Source: Ambulatory Visit | Attending: Radiation Oncology | Admitting: Radiation Oncology

## 2021-10-21 ENCOUNTER — Inpatient Hospital Stay: Payer: 59

## 2021-10-21 DIAGNOSIS — Z510 Encounter for antineoplastic radiation therapy: Secondary | ICD-10-CM | POA: Diagnosis not present

## 2021-10-22 ENCOUNTER — Ambulatory Visit
Admission: RE | Admit: 2021-10-22 | Discharge: 2021-10-22 | Disposition: A | Discharge status: Home / Self Care | Payer: 59 | Source: Ambulatory Visit | Attending: Radiation Oncology | Admitting: Radiation Oncology

## 2021-10-22 ENCOUNTER — Inpatient Hospital Stay: Payer: 59

## 2021-10-22 DIAGNOSIS — Z510 Encounter for antineoplastic radiation therapy: Secondary | ICD-10-CM | POA: Diagnosis not present

## 2021-10-23 ENCOUNTER — Ambulatory Visit
Admission: RE | Admit: 2021-10-23 | Discharge: 2021-10-23 | Disposition: A | Discharge status: Home / Self Care | Payer: 59 | Source: Ambulatory Visit | Attending: Radiation Oncology | Admitting: Radiation Oncology

## 2021-10-23 ENCOUNTER — Inpatient Hospital Stay: Payer: 59

## 2021-10-23 DIAGNOSIS — Z510 Encounter for antineoplastic radiation therapy: Secondary | ICD-10-CM | POA: Diagnosis not present

## 2021-10-24 ENCOUNTER — Ambulatory Visit (INDEPENDENT_AMBULATORY_CARE_PROVIDER_SITE_OTHER): Payer: 59 | Admitting: Plastic Surgery

## 2021-10-24 ENCOUNTER — Encounter: Payer: Self-pay | Admitting: Plastic Surgery

## 2021-10-24 ENCOUNTER — Inpatient Hospital Stay: Payer: 59

## 2021-10-24 ENCOUNTER — Ambulatory Visit
Admission: RE | Admit: 2021-10-24 | Discharge: 2021-10-24 | Disposition: A | Discharge status: Home / Self Care | Payer: 59 | Source: Ambulatory Visit | Attending: Radiation Oncology | Admitting: Radiation Oncology

## 2021-10-24 ENCOUNTER — Other Ambulatory Visit: Payer: Self-pay

## 2021-10-24 DIAGNOSIS — C50919 Malignant neoplasm of unspecified site of unspecified female breast: Secondary | ICD-10-CM

## 2021-10-24 DIAGNOSIS — Z510 Encounter for antineoplastic radiation therapy: Secondary | ICD-10-CM | POA: Diagnosis not present

## 2021-10-24 DIAGNOSIS — Z9013 Acquired absence of bilateral breasts and nipples: Secondary | ICD-10-CM

## 2021-10-25 ENCOUNTER — Encounter: Payer: Self-pay | Admitting: Plastic Surgery

## 2021-10-25 DIAGNOSIS — Z9013 Acquired absence of bilateral breasts and nipples: Secondary | ICD-10-CM | POA: Insufficient documentation

## 2021-10-25 NOTE — Progress Notes (Signed)
° °  Subjective:    Patient ID: Vickie Mathis, female    DOB: 19-Oct-1990, 31 y.o.   MRN: [id number]  The patient is a 31 year old female here for follow-up on her breast reconstruction.  She was first seen 6 months ago for evaluation and then underwent bilateral mastectomies with expander placement in September.  She had ongoing seromas and concern for infection.  Ultimately she decided to have the expanders removed.  This was slowing down her time to get to radiation.  The expanders were removed in December.  She is doing much better overall.  Her radiation is starting and she is interested in reconstruction once it is done.  There does not appear to be any seroma or infection.     Review of Systems  Constitutional:  Positive for activity change. Negative for appetite change.  HENT: Negative.    Eyes: Negative.   Respiratory: Negative.    Cardiovascular: Negative.   Gastrointestinal: Negative.   Endocrine: Negative.   Genitourinary: Negative.   Musculoskeletal: Negative.   Skin: Negative.   Hematological: Negative.   Psychiatric/Behavioral: Negative.        Objective:   Physical Exam Constitutional:      Appearance: Normal appearance.  HENT:     Head: Normocephalic and atraumatic.  Cardiovascular:     Rate and Rhythm: Normal rate.  Pulmonary:     Effort: Pulmonary effort is normal.  Abdominal:     Palpations: Abdomen is soft.  Skin:    General: Skin is warm.     Capillary Refill: Capillary refill takes less than 2 seconds.  Neurological:     Mental Status: She is alert and oriented to person, place, and time.  Psychiatric:        Mood and Affect: Mood normal.        Behavior: Behavior normal.        Thought Content: Thought content normal.     Assessment & Plan:     ICD-10-CM   1. Invasive carcinoma of breast ([place])  C50.919     2. 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  The patient is a candidate for autologous reconstruction.  We talked about the difference between a TRAM and a Diep flap.  She is more interested in the Diep flap.  I would like to see her after her radiation has finished which will likely be August.  We can then refer her to Dr. Veda Canning at [facility].  In the meantime she knows to work on her protein and keep her weight stable.  She has done this job of through all these challenges with this part of her.

## 2021-10-27 ENCOUNTER — Ambulatory Visit
Admission: RE | Admit: 2021-10-27 | Discharge: 2021-10-27 | Disposition: A | Discharge status: Home / Self Care | Payer: 59 | Source: Ambulatory Visit | Attending: Radiation Oncology | Admitting: Radiation Oncology

## 2021-10-27 ENCOUNTER — Inpatient Hospital Stay: Payer: 59

## 2021-10-27 DIAGNOSIS — Z510 Encounter for antineoplastic radiation therapy: Secondary | ICD-10-CM | POA: Diagnosis not present

## 2021-10-28 ENCOUNTER — Ambulatory Visit
Admission: RE | Admit: 2021-10-28 | Discharge: 2021-10-28 | Disposition: A | Discharge status: Home / Self Care | Payer: 59 | Source: Ambulatory Visit | Attending: Radiation Oncology | Admitting: Radiation Oncology

## 2021-10-28 ENCOUNTER — Inpatient Hospital Stay: Payer: 59

## 2021-10-28 ENCOUNTER — Ambulatory Visit: Payer: 59 | Admitting: Radiation Oncology

## 2021-10-28 DIAGNOSIS — Z510 Encounter for antineoplastic radiation therapy: Secondary | ICD-10-CM | POA: Diagnosis not present

## 2021-10-29 ENCOUNTER — Inpatient Hospital Stay: Payer: 59

## 2021-10-29 ENCOUNTER — Other Ambulatory Visit: Payer: Self-pay

## 2021-10-29 ENCOUNTER — Ambulatory Visit: Admission: RE | Admit: 2021-10-29 | Payer: 59 | Source: Ambulatory Visit

## 2021-10-29 ENCOUNTER — Ambulatory Visit: Payer: 59

## 2021-10-29 ENCOUNTER — Inpatient Hospital Stay (HOSPITAL_BASED_OUTPATIENT_CLINIC_OR_DEPARTMENT_OTHER): Payer: 59 | Admitting: Oncology

## 2021-10-29 ENCOUNTER — Encounter: Payer: Self-pay | Admitting: Oncology

## 2021-10-29 VITALS — BP 93/61 | HR 85 | Temp 97.0°F | Resp 16 | Ht 69.0 in | Wt 201.0 lb

## 2021-10-29 DIAGNOSIS — Z171 Estrogen receptor negative status [ER-]: Secondary | ICD-10-CM

## 2021-10-29 DIAGNOSIS — R7989 Other specified abnormal findings of blood chemistry: Secondary | ICD-10-CM | POA: Diagnosis not present

## 2021-10-29 DIAGNOSIS — R946 Abnormal results of thyroid function studies: Secondary | ICD-10-CM

## 2021-10-29 DIAGNOSIS — T451X5A Adverse effect of antineoplastic and immunosuppressive drugs, initial encounter: Secondary | ICD-10-CM

## 2021-10-29 DIAGNOSIS — G620 Drug-induced polyneuropathy: Secondary | ICD-10-CM

## 2021-10-29 DIAGNOSIS — C50412 Malignant neoplasm of upper-outer quadrant of left female breast: Secondary | ICD-10-CM

## 2021-10-29 DIAGNOSIS — Z510 Encounter for antineoplastic radiation therapy: Secondary | ICD-10-CM | POA: Diagnosis not present

## 2021-10-29 DIAGNOSIS — C50919 Malignant neoplasm of unspecified site of unspecified female breast: Secondary | ICD-10-CM

## 2021-10-29 LAB — CBC WITH DIFFERENTIAL/PLATELET
Abs Immature Granulocytes: 0.03 10*3/uL (ref 0.00–0.07)
Basophils Absolute: 0.1 10*3/uL (ref 0.0–0.1)
Basophils Relative: 2 %
Eosinophils Absolute: 1.9 10*3/uL — ABNORMAL HIGH (ref 0.0–0.5)
Eosinophils Relative: 27 %
HCT: 33.7 % — ABNORMAL LOW (ref 36.0–46.0)
Hemoglobin: 11.1 g/dL — ABNORMAL LOW (ref 12.0–15.0)
Immature Granulocytes: 0 %
Lymphocytes Relative: 10 %
Lymphs Abs: 0.7 10*3/uL (ref 0.7–4.0)
MCH: 27.5 pg (ref 26.0–34.0)
MCHC: 32.9 g/dL (ref 30.0–36.0)
MCV: 83.4 fL (ref 80.0–100.0)
Monocytes Absolute: 0.8 10*3/uL (ref 0.1–1.0)
Monocytes Relative: 12 %
Neutro Abs: 3.4 10*3/uL (ref 1.7–7.7)
Neutrophils Relative %: 49 %
Platelets: 210 10*3/uL (ref 150–400)
RBC: 4.04 MIL/uL (ref 3.87–5.11)
RDW: 18.5 % — ABNORMAL HIGH (ref 11.5–15.5)
WBC: 7 10*3/uL (ref 4.0–10.5)
nRBC: 0 % (ref 0.0–0.2)

## 2021-10-29 LAB — COMPREHENSIVE METABOLIC PANEL
ALT: 303 U/L — ABNORMAL HIGH (ref 0–44)
AST: 270 U/L — ABNORMAL HIGH (ref 15–41)
Albumin: 4.1 g/dL (ref 3.5–5.0)
Alkaline Phosphatase: 194 U/L — ABNORMAL HIGH (ref 38–126)
Anion gap: 9 (ref 5–15)
BUN: 12 mg/dL (ref 6–20)
CO2: 26 mmol/L (ref 22–32)
Calcium: 9.3 mg/dL (ref 8.9–10.3)
Chloride: 102 mmol/L (ref 98–111)
Creatinine, Ser: 0.7 mg/dL (ref 0.44–1.00)
GFR, Estimated: >60 mL/min (ref >60)
Glucose, Bld: 97 mg/dL (ref 70–99)
Potassium: 4.2 mmol/L (ref 3.5–5.1)
Sodium: 137 mmol/L (ref 135–145)
Total Bilirubin: 0.3 mg/dL (ref 0.3–1.2)
Total Protein: 6.5 g/dL (ref 6.5–8.1)

## 2021-10-29 MED ORDER — SODIUM CHLORIDE 0.9% FLUSH
10.0000 mL | Freq: Once | INTRAVENOUS | Status: AC
  Administered 2021-10-29: 14:00:00 10 mL via INTRAVENOUS
  Filled 2021-10-29: qty 10

## 2021-10-29 MED ORDER — GABAPENTIN 300 MG PO CAPS: 300.0000 mg | ORAL_CAPSULE | Freq: Three times a day (TID) | ORAL | 0 refills | Status: DC

## 2021-10-29 MED ORDER — HEPARIN SOD (PORK) LOCK FLUSH 100 UNIT/ML IV SOLN
500.0000 [IU] | Freq: Once | INTRAVENOUS | Status: AC
  Administered 2021-10-29: 15:00:00 500 [IU] via INTRAVENOUS
  Filled 2021-10-29: qty 5

## 2021-10-29 MED ORDER — METHOCARBAMOL 500 MG PO TABS: ORAL_TABLET | ORAL | 0 refills | Status: DC

## 2021-10-29 NOTE — Progress Notes (Signed)
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  Ultrasound of the left axilla demonstrates 2 lymph nodes with mild thickened cortices of 4 mm.  There is skin thickening on the lower inner quadrant of the left breast on mammography.  Both the breast mass and the lymph node were biopsied and was positive for invasive mammary carcinoma grade 3.  Lymph node was also suspicious for extracapsular extension.    ER/PR and HER-2 negative   MRI showed irregular enhancing mass at the 12 o'clock position of the left breast measuring 2.5 x 2.3 cm and a linear component extending 2.3 cm anteriorly.  The mass in the anterior linear extension combined measure 4.3 cm.  3.9 cm in the cephalocaudal dimension.  Also an area of clumped non-mass enhancement in the posterior aspect of the lower quadrant of the left breast measuring 2.6 x 0.9 cm which looks suspicious.  2 enlarged left axillary lymph nodes and 2 mildly enlarged internal mammary lymph nodes.  4.3 cm clumped area of non-mass enhancement in the outer quadrant of the right breast.  3 right axillary lymph nodes with mild cortical thickening.   Patient underwent biopsy  of the right breast non-mass enhancement and that was negative for malignancy   CT scan showed mildly enlarged right inguinal lymph nodes nonspecific.  Left upper breast mass with prominent left axillary lymph nodes.  Subcentimeter pulmonary nodules which are calcified and compatible with benign old granulomatous disease.  0.6 5.4 cm lucent lesion in the left first rib possibly a hemangioma or benign lesion.   Patient developed significant infusion reaction to carboplatin with dose 9 when her blood pressure dropped and she became tachycardic and significantly nauseous.  She went on to complete [facility] Keytruda chemotherapy as per keynote 522 regimen.  Patient underwent bilateral mastectomy with reconstruction in September 2022.  Final pathology showed complete pathological response2 sentinel lymph nodes negative for malignancy.  No malignancy noted in the right breast.      Interval history-patient has baseline fatigue.  Denies any new complaints at this time.  She has not started adjuvant radiation treatment which she is tolerating it well.  She has some baseline neuropathy in her hands and feet for which she is on gabapentin.  ECOG PS- 1 Pain scale- 0 Opioid associated constipation- no  Review of systems- Review of Systems  Constitutional:  Positive for malaise/fatigue. Negative for chills, fever and weight loss.  HENT:  Negative for congestion, ear discharge and nosebleeds.   Eyes:  Negative for blurred vision.  Respiratory:  Negative for cough, hemoptysis, sputum production, shortness of breath and wheezing.   Cardiovascular:  Negative for chest pain, palpitations, orthopnea and claudication.  Gastrointestinal:  Negative for abdominal pain, blood in stool,  constipation, diarrhea, heartburn, melena, nausea and vomiting.  Genitourinary:  Negative for dysuria, flank pain, frequency, hematuria and urgency.  Musculoskeletal:  Negative for back pain, joint pain and myalgias.  Skin:  Negative for  rash.  Neurological:  Positive for sensory change (Peripheral neuropathy). Negative for dizziness, tingling, focal weakness, seizures, weakness and headaches.  Endo/Heme/Allergies:  Does not bruise/bleed easily.  Psychiatric/Behavioral:  Negative for depression and suicidal ideas. The patient does not have insomnia.      Allergies  Allergen Reactions   Carboplatin Shortness Of Breath, Nausea And Vomiting and Other (See Comments)    Flushing- chest , face, neck , arm including hand   Amoxicillin     Other reaction(s): "too young to remember what they do to me"   Sulfa Antibiotics     Other reaction(s): "too young to remember what they do to me"     Past Medical History:  Diagnosis Date   Anxiety    Asthma    Breast cancer ([place]) 11/2020   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 breast ca   Depression    Family history of cancer    History of chemotherapy    Varicose veins of bilateral lower extremities with pain      Past Surgical History:  Procedure Laterality Date   APPENDECTOMY  2018   BILATERAL TOTAL MASTECTOMY WITH AXILLARY LYMPH NODE DISSECTION Bilateral 06/25/2021   Procedure: BILATERAL TOTAL MASTECTOMY WITH LEFT AXILLARY LYMPH NODE BIOPSY VS. AXILLARY NODE DISSECTION;  Surgeon: Herbert Pun, MD;  Location: [facility] ORS;  Service: General;  Laterality: Bilateral;  [place], 1.5 hours C[place]-Diaz 2.5 hours   BREAST BIOPSY Left 11/26/2020   vision 12:00 6cmfn [facility]   BREAST BIOPSY Left 11/26/2020   LN bx, hydro marker,  fragments of macrometastatic carcinoma   BREAST RECONSTRUCTION WITH PLACEMENT OF TISSUE EXPANDER AND FLEX HD (ACELLULAR HYDRATED DERMIS) Bilateral 06/25/2021   Procedure: BREAST RECONSTRUCTION WITH PLACEMENT OF TISSUE EXPANDER AND FLEX HD (ACELLULAR HYDRATED DERMIS);  Surgeon: Wallace Going, DO;  Location: [facility] ORS;  Service: Plastics;  Laterality: Bilateral;   PORTACATH PLACEMENT Right 12/13/2020   Procedure: INSERTION PORT-A-CATH;  Surgeon: Herbert Pun, MD;  Location: [facility] ORS;  Service: General;  Laterality: Right;   REMOVAL OF BILATERAL TISSUE EXPANDERS WITH PLACEMENT OF BILATERAL BREAST IMPLANTS Bilateral 09/15/2021   Procedure: REMOVAL OF BILATERAL TISSUE EXPANDERS;  Surgeon: Wallace Going, DO;  Location: [place];  Service: Plastics;  Laterality: Bilateral;    Social History   Socioeconomic History   Marital status: Married    Spouse name: Not on file   Number of children: Not on file   Years of education: Not on file   Highest education level: Not on file  Occupational History   Not on file  Tobacco Use   Smoking status: Never   Smokeless tobacco: Never  Vaping Use   Vaping Use: Never used  Substance and Sexual Activity   Alcohol use: Not Currently    Comment: occassinally    Drug use: Yes    Types: Marijuana    Comment: occ   Sexual activity: Yes  Other Topics Concern   Not on file  Social History Narrative   Not on file   Social Determinants of Health   Financial Resource Strain: Not on file  Food Insecurity: Not on file  Transportation Needs: Not on file  Physical Activity: Not on file  Stress: Not on file  Social Connections: Not on file  Intimate Partner Violence: Not on  file    Family History  Problem Relation Age of Onset   Diabetes Father    Varicose Veins Father    Diabetes Paternal Aunt    Uterine cancer Paternal Aunt        precancerous   Diabetes Paternal Grandmother    Uterine cancer Paternal Grandmother        precancerous   Thyroid disease Mother    Thyroid disease Maternal Aunt    Thyroid disease Maternal Grandmother    Cancer Other        stomach vs ovarian/cervical   Cancer Paternal Great-grandmother        unk     Current Outpatient Medications:    busPIRone (BUSPAR) 15 MG tablet, Take 15 mg by mouth 2 (two) times daily., Disp: , Rfl:    busPIRone (BUSPAR) 15 MG tablet, Take 1 tablet by mouth 2 (two) times daily., Disp: , Rfl:    citalopram  (CELEXA) 40 MG tablet, Take 1 tablet (40 mg total) by mouth daily. (Patient taking differently: Take 40 mg by mouth at bedtime.), Disp: 90 tablet, Rfl: 2   clonazePAM (KLONOPIN) 0.5 MG tablet, Take 1 tablet (0.5 mg total) by mouth 3 (three) times daily as needed., Disp: 60 tablet, Rfl: 0   Cyanocobalamin (B-12 PO), Take 1 tablet by mouth daily., Disp: , Rfl:    docusate sodium (COLACE) 100 MG capsule, Take 100 mg by mouth daily as needed for mild constipation., Disp: , Rfl:    doxycycline (VIBRA-TABS) 100 MG tablet, Take 1 tablet (100 mg total) by mouth 2 (two) times daily., Disp: 14 tablet, Rfl: 0   folic acid (FOLVITE) 1 MG tablet, TAKE 2 TABLETS BY MOUTH EVERY DAY, Disp: 180 tablet, Rfl: 0   hydrOXYzine (VISTARIL) 25 MG capsule, TAKE 1 CAPSULE (25 MG TOTAL) BY MOUTH AT BEDTIME AS NEEDED FOR UP TO 20 DOSES., Disp: 20 capsule, Rfl: 0   ibuprofen (ADVIL) 200 MG tablet, Take 400 mg by mouth every 6 (six) hours as needed for moderate pain., Disp: , Rfl:    lidocaine-prilocaine (EMLA) cream, Apply 1 application topically daily as needed (prior to port access)., Disp: 30 g, Rfl: 3   Multiple Vitamin (MULTIVITAMIN WITH MINERALS) TABS tablet, Take 1 tablet by mouth daily., Disp: , Rfl:    ondansetron (ZOFRAN ODT) 4 MG disintegrating tablet, Take 1 tablet (4 mg total) by mouth every 8 (eight) hours as needed for nausea or vomiting., Disp: 30 tablet, Rfl: 2   ondansetron (ZOFRAN) 8 MG tablet, Take 1 tablet (8 mg total) by mouth every 8 (eight) hours as needed for refractory nausea / vomiting. Start on day 3 after chemo., Disp: 60 tablet, Rfl: 1   ondansetron (ZOFRAN-ODT) 4 MG disintegrating tablet, Take 1 tablet (4 mg total) by mouth every 8 (eight) hours as needed for nausea or vomiting., Disp: 20 tablet, Rfl: 0   potassium chloride SA (KLOR-CON M) 20 MEQ tablet, Take 1 tablet (20 mEq total) by mouth daily., Disp: 7 tablet, Rfl: 0   triamcinolone ointment (KENALOG) 0.5 %, Apply 1 application topically 2 (two)  times daily., Disp: 30 g, Rfl: 0   TURMERIC CURCUMIN PO, Take 2 capsules by mouth daily., Disp: , Rfl:    gabapentin (NEURONTIN) 300 MG capsule, Take 1 capsule (300 mg total) by mouth 3 (three) times daily for 14 days., Disp: 42 capsule, Rfl: 0   methocarbamol (ROBAXIN) 500 MG tablet, TAKE 1 TABLET BY MOUTH 3 TIMES DAILY FOR 14 DAYS., Disp: 42 tablet,  Rfl: 0 No current facility-administered medications for this visit.  Facility-Administered Medications Ordered in Other Visits:    prochlorperazine (COMPAZINE) tablet 10 mg, 10 mg, Oral, Q6H PRN, Sindy Guadeloupe, MD, 10 mg at 07/16/21 1407  Physical exam:  Vitals:   10/29/21 1418  BP: 93/61  Pulse: 85  Resp: 16  Temp: (!) 97 F (36.1 C)  TempSrc: Tympanic  SpO2: 97%  Weight: 201 lb (91.2 kg)  Height: $Remove'5\' 9"'IiFXkoq$  (1.753 m)   Physical Exam Constitutional:      General: She is not in acute distress. Cardiovascular:     Rate and Rhythm: Normal rate and regular rhythm.     Heart sounds: Normal heart sounds.  Pulmonary:     Effort: Pulmonary effort is normal.  Skin:    General: Skin is warm and dry.  Neurological:     Mental Status: She is alert and oriented to person, place, and time.     CMP Latest Ref Rng & Units 10/29/2021  Glucose 70 - 99 mg/dL 97  BUN 6 - 20 mg/dL 12  Creatinine 0.44 - 1.00 mg/dL 0.70  Sodium 135 - 145 mmol/L 137  Potassium 3.5 - 5.1 mmol/L 4.2  Chloride 98 - 111 mmol/L 102  CO2 22 - 32 mmol/L 26  Calcium 8.9 - 10.3 mg/dL 9.3  Total Protein 6.5 - 8.1 g/dL 6.5  Total Bilirubin 0.3 - 1.2 mg/dL 0.3  Alkaline Phos 38 - 126 U/L 194(H)  AST 15 - 41 U/L 270(H)  ALT 0 - 44 U/L 303(H)   CBC Latest Ref Rng & Units 10/29/2021  WBC 4.0 - 10.5 K/uL 7.0  Hemoglobin 12.0 - 15.0 g/dL 11.1(L)  Hematocrit 36.0 - 46.0 % 33.7(L)  Platelets 150 - 400 K/uL 210    No images are attached to the encounter.  CT CHEST ABDOMEN PELVIS W CONTRAST  Result Date: 10/02/2021 CLINICAL DATA:  Breast cancer follow-up after treatment.  Previous history left breast cancer and mastectomy, currently with a drain in the right breast. EXAM: CT CHEST, ABDOMEN, AND PELVIS WITH CONTRAST TECHNIQUE: Multidetector CT imaging of the chest, abdomen and pelvis was performed following the standard protocol during bolus administration of intravenous contrast. CONTRAST:  152mL OMNIPAQUE IOHEXOL 300 MG/ML  SOLN COMPARISON:  Chest, abdomen and pelvis CT with contrast 12/16/2020 FINDINGS: CT CHEST FINDINGS Cardiovascular: Cardiac size, central pulmonary arteries and aorta are normal. There is no pericardial effusion venous distention. No aortic atherosclerosis. Right IJ port catheter tip remains in the SVC. Mediastinum/Nodes: There are small right axillary lymph nodes which are unchanged a few tiny left axillary nodes also unchanged. There previously were mildly prominent left axillary nodes which appear to have been removed. There is thin walled left axillary fluid collection measuring 2.3 cm and -1.0 Hounsfield units which probably a postoperative seroma. There are few slightly prominent bilateral hilar nodes again measuring up to 1.1 cm in short axis on both sides. Right paratracheal lymph node on series 2 axial 29 measures 7.9 mm, previously 6.6 mm. There are small stable AP window lymph nodes up to 7.9 mm in short axis, similar sized subcarinal lymph nodes also unchanged. The lower poles of the thyroid gland and esophagus are unremarkable and there is no further adenopathy. Lungs/Pleura: There are calcified granulomas again noted in the superior segments of both lower lobes. There is a calcified granuloma posteriorly in the left upper lobe at the level of the carina. There is a 3 mm stable noncalcified nodule anteriorly in the left  upper lobe on series 4 axial 37 and a 3.5 mm stable noncalcified left lower lobe nodule on series 4 axial 28. The trachea, central airways remaining lungs are clear. There is no pleural effusion or pneumothorax. Musculoskeletal:  Interval bilateral mastectomies. Surgical drain is in place in the right chest wall. Underlying the left mastectomy there is a subcutaneous rim enhancing fluid collection extending along the anterolateral chest wall and measuring 5.3 cm AP, 0.9 cm coronal and up to 11 cm craniocaudal, probable seroma versus hematoma, less likely abscess. There were there are scattered air pockets in the deep subcutaneous plane over the anterior chest wall on the left, and as above, a 2.3 cm thin walled fluid collection in the left axilla, subcutaneous stranding underlying the mastectomy sites on both sides. There is a stable 6.9 x 5.4 mm lucent lesion in the posteromedial left first rib, on image 7 of series 4. No other focal regional bone lesion is seen. CT ABDOMEN PELVIS FINDINGS Hepatobiliary: There is no mass enhancement. Mild hepatic steatosis. Unremarkable gallbladder common bile ducts. Pancreas: No mass or ductal dilatation. Mild tail segmental atrophy. Spleen: Normal. Adrenals/Urinary Tract: No mass enhancement, hydronephrosis or calculus, or bladder thickening. Stomach/Bowel: No dilatation or wall thickening. An appendix is not seen in this patient. There is moderate fecal stasis. Vascular/Lymphatic: There previously were mildly prominent right external iliac chain and right inguinal lymph nodes which are no longer prominent. Small subcentimeter mesenteric root lymph nodes are unchanged. There are no enlarged abdominal or pelvic nodes by pathologic size criteria. There is no AAA. Reproductive: No acute or focal abnormality. Other: Small umbilical fat hernia. No free air, hemorrhage or fluid. Musculoskeletal: No destructive regional bone lesions. IMPRESSION: 1. Since 12/16/2020, interval bilateral mastectomies, surgical drain in place on the right, with subcutaneous stranding on both sides. 2. A few small air pockets in the anterior left chest wall deep subcutaneous plane, possibly postsurgical in etiology. Infectious  process not excluded. 3. Thin walled fluid collections in the anterolateral left chest wall and left axilla, possible postsurgical seromas or hematomas, less likely infectious. 4. Stable slightly prominent bilateral hilar nodes, with stable subcentimeter mediastinal nodes except for a right paratracheal node which was previously 6.6 mm and is now 7.9 mm short axis. 5. 2 tiny stable left lung nodules. No new or enlarging nodule. Old granulomatous disease. 6. Interval decreased size of previously slightly enlarged right inguinal and right external iliac chain nodes. 7. Constipation. 8. Stable subcentimeter lucent lesion in the posteromedial left first rib. Electronically Signed   By: Telford Nab M.D.   On: 10/02/2021 02:56     Assessment and plan- Patient is a 31 y.o. female with stage IIIb triple negative breast cancer of the left breast cT2 N1 M0.  She is s/p neoadjuvant chemotherapy as per keynote 522 protocol followed by complete pathological response.  She is here for on treatment assessment prior to cycle 5 of adjuvant Keytruda  Her liver enzymes are further abnormal today and her AST is up from 100 prior to 270 presently.  ALT up from 121-303 presently and alkaline phosphatase elevated at 194.  Total bilirubin remains normal at 0.3.  I will therefore hold off on giving her Keytruda today.  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  I will obtain a right upper quadrant ultrasound.  I will reevaluate her with repeat labs in 3 weeks and if her LFTs trend down again we will restart Keytruda at that time.  Plan is to complete 9 cycles of adjuvant treatment of possible.  If LFTs continue to trend up I will consider giving her empiric course of steroids.  She will continue with adjuvant radiation therapy in the meanwhile  Abnormal thyroid function test: We will reevaluate at next visit  Chemo induced peripheral neuropathy: Continue gabapentin   Visit  Diagnosis 1. Abnormal LFTs   2. Malignant neoplasm of upper-outer quadrant of left breast in female, estrogen receptor negative ([place])   3. Chemotherapy-induced peripheral neuropathy ([place])   4. Abnormal thyroid function test      Dr. Randa Evens, MD, MPH [facility] [phone] 10/29/2021 3:52 PM

## 2021-10-30 ENCOUNTER — Inpatient Hospital Stay: Payer: 59

## 2021-10-30 ENCOUNTER — Ambulatory Visit
Admission: RE | Admit: 2021-10-30 | Discharge: 2021-10-30 | Disposition: A | Discharge status: Home / Self Care | Payer: 59 | Source: Ambulatory Visit | Attending: Radiation Oncology | Admitting: Radiation Oncology

## 2021-10-30 ENCOUNTER — Telehealth: Payer: Self-pay | Admitting: Oncology

## 2021-10-30 DIAGNOSIS — Z510 Encounter for antineoplastic radiation therapy: Secondary | ICD-10-CM | POA: Diagnosis not present

## 2021-10-30 NOTE — Telephone Encounter (Signed)
Telephone conversation with patient based upon referral from L-CSW  Discussed one-time $1000 [place] and qualifications to assist with personal expenses while undergoing treatment.  Patient is approved [place].  Provided patient my contact information for any additional questions.

## 2021-10-31 ENCOUNTER — Inpatient Hospital Stay: Payer: 59

## 2021-10-31 ENCOUNTER — Ambulatory Visit: Payer: 59 | Admitting: Plastic Surgery

## 2021-10-31 ENCOUNTER — Ambulatory Visit
Admission: RE | Admit: 2021-10-31 | Discharge: 2021-10-31 | Disposition: A | Discharge status: Home / Self Care | Payer: 59 | Source: Ambulatory Visit | Attending: Radiation Oncology | Admitting: Radiation Oncology

## 2021-10-31 DIAGNOSIS — Z510 Encounter for antineoplastic radiation therapy: Secondary | ICD-10-CM | POA: Diagnosis not present

## 2021-11-03 ENCOUNTER — Inpatient Hospital Stay: Payer: 59

## 2021-11-03 ENCOUNTER — Encounter: Payer: Self-pay | Admitting: *Deleted

## 2021-11-03 ENCOUNTER — Telehealth: Payer: Self-pay | Admitting: *Deleted

## 2021-11-03 ENCOUNTER — Ambulatory Visit
Admission: RE | Admit: 2021-11-03 | Discharge: 2021-11-03 | Disposition: A | Discharge status: Home / Self Care | Payer: 59 | Source: Ambulatory Visit | Attending: Radiation Oncology | Admitting: Radiation Oncology

## 2021-11-03 DIAGNOSIS — Z510 Encounter for antineoplastic radiation therapy: Secondary | ICD-10-CM | POA: Diagnosis not present

## 2021-11-03 NOTE — Telephone Encounter (Addendum)
Patient requested letter to [facility] to report her dx and confirm that she is under active treatment. Letter written - pending Dr. Elroy Channel signature. I will notify patient when letter is ready for pick up.

## 2021-11-04 ENCOUNTER — Inpatient Hospital Stay: Payer: 59

## 2021-11-04 ENCOUNTER — Ambulatory Visit
Admission: RE | Admit: 2021-11-04 | Discharge: 2021-11-04 | Disposition: A | Discharge status: Home / Self Care | Payer: 59 | Source: Ambulatory Visit | Attending: Radiation Oncology | Admitting: Radiation Oncology

## 2021-11-04 ENCOUNTER — Encounter: Payer: Self-pay | Admitting: Internal Medicine

## 2021-11-04 ENCOUNTER — Other Ambulatory Visit: Payer: Self-pay

## 2021-11-04 ENCOUNTER — Encounter: Payer: Self-pay | Admitting: Oncology

## 2021-11-04 DIAGNOSIS — C50412 Malignant neoplasm of upper-outer quadrant of left female breast: Secondary | ICD-10-CM

## 2021-11-04 DIAGNOSIS — Z510 Encounter for antineoplastic radiation therapy: Secondary | ICD-10-CM | POA: Diagnosis not present

## 2021-11-04 LAB — CBC
HCT: 35.8 % — ABNORMAL LOW (ref 36.0–46.0)
Hemoglobin: 11.8 g/dL — ABNORMAL LOW (ref 12.0–15.0)
MCH: 27.4 pg (ref 26.0–34.0)
MCHC: 33 g/dL (ref 30.0–36.0)
MCV: 83.3 fL (ref 80.0–100.0)
Platelets: 172 10*3/uL (ref 150–400)
RBC: 4.3 MIL/uL (ref 3.87–5.11)
RDW: 19 % — ABNORMAL HIGH (ref 11.5–15.5)
WBC: 6.8 10*3/uL (ref 4.0–10.5)
nRBC: 0 % (ref 0.0–0.2)

## 2021-11-04 MED ORDER — HEPARIN SOD (PORK) LOCK FLUSH 100 UNIT/ML IV SOLN
500.0000 [IU] | Freq: Once | INTRAVENOUS | Status: AC
  Administered 2021-11-04: 500 [IU] via INTRAVENOUS
  Filled 2021-11-04: qty 5

## 2021-11-04 MED ORDER — SODIUM CHLORIDE 0.9% FLUSH
10.0000 mL | Freq: Once | INTRAVENOUS | Status: AC
  Administered 2021-11-04: 10 mL via INTRAVENOUS
  Filled 2021-11-04: qty 10

## 2021-11-04 NOTE — Telephone Encounter (Signed)
Patient notified that forms are ready to be picked up. Patient requested that I fax the form for her to [facility]. Form faxed to [phone] per pt's request. She will pick up the original forms this afternoon at 1245.

## 2021-11-05 ENCOUNTER — Ambulatory Visit: Payer: 59

## 2021-11-05 ENCOUNTER — Ambulatory Visit: Admission: RE | Admit: 2021-11-05 | Payer: 59 | Source: Ambulatory Visit

## 2021-11-05 ENCOUNTER — Inpatient Hospital Stay: Payer: 59

## 2021-11-05 DIAGNOSIS — Z8049 Family history of malignant neoplasm of other genital organs: Secondary | ICD-10-CM | POA: Insufficient documentation

## 2021-11-05 DIAGNOSIS — Z171 Estrogen receptor negative status [ER-]: Secondary | ICD-10-CM | POA: Insufficient documentation

## 2021-11-05 DIAGNOSIS — R519 Headache, unspecified: Secondary | ICD-10-CM | POA: Insufficient documentation

## 2021-11-05 DIAGNOSIS — C50412 Malignant neoplasm of upper-outer quadrant of left female breast: Secondary | ICD-10-CM | POA: Insufficient documentation

## 2021-11-05 DIAGNOSIS — K7589 Other specified inflammatory liver diseases: Secondary | ICD-10-CM | POA: Insufficient documentation

## 2021-11-05 DIAGNOSIS — Z9013 Acquired absence of bilateral breasts and nipples: Secondary | ICD-10-CM | POA: Insufficient documentation

## 2021-11-06 ENCOUNTER — Ambulatory Visit: Payer: 59

## 2021-11-06 ENCOUNTER — Ambulatory Visit
Admission: RE | Admit: 2021-11-06 | Discharge: 2021-11-06 | Disposition: A | Discharge status: Home / Self Care | Payer: 59 | Source: Ambulatory Visit | Attending: Radiation Oncology | Admitting: Radiation Oncology

## 2021-11-06 ENCOUNTER — Inpatient Hospital Stay: Payer: 59

## 2021-11-06 DIAGNOSIS — Z510 Encounter for antineoplastic radiation therapy: Secondary | ICD-10-CM | POA: Insufficient documentation

## 2021-11-06 DIAGNOSIS — Z5112 Encounter for antineoplastic immunotherapy: Secondary | ICD-10-CM | POA: Insufficient documentation

## 2021-11-06 DIAGNOSIS — C50812 Malignant neoplasm of overlapping sites of left female breast: Secondary | ICD-10-CM | POA: Insufficient documentation

## 2021-11-06 DIAGNOSIS — C773 Secondary and unspecified malignant neoplasm of axilla and upper limb lymph nodes: Secondary | ICD-10-CM | POA: Insufficient documentation

## 2021-11-07 ENCOUNTER — Inpatient Hospital Stay: Payer: 59

## 2021-11-07 ENCOUNTER — Ambulatory Visit: Payer: 59

## 2021-11-07 ENCOUNTER — Ambulatory Visit: Payer: 59 | Admitting: Radiation Oncology

## 2021-11-07 DIAGNOSIS — Z510 Encounter for antineoplastic radiation therapy: Secondary | ICD-10-CM | POA: Diagnosis present

## 2021-11-07 DIAGNOSIS — C773 Secondary and unspecified malignant neoplasm of axilla and upper limb lymph nodes: Secondary | ICD-10-CM | POA: Diagnosis not present

## 2021-11-07 DIAGNOSIS — Z5112 Encounter for antineoplastic immunotherapy: Secondary | ICD-10-CM | POA: Diagnosis present

## 2021-11-07 DIAGNOSIS — C50812 Malignant neoplasm of overlapping sites of left female breast: Secondary | ICD-10-CM | POA: Diagnosis present

## 2021-11-10 ENCOUNTER — Inpatient Hospital Stay: Payer: 59

## 2021-11-10 ENCOUNTER — Ambulatory Visit
Admission: RE | Admit: 2021-11-10 | Discharge: 2021-11-10 | Disposition: A | Discharge status: Home / Self Care | Payer: 59 | Source: Ambulatory Visit | Attending: Radiation Oncology | Admitting: Radiation Oncology

## 2021-11-10 ENCOUNTER — Other Ambulatory Visit: Payer: Self-pay

## 2021-11-10 ENCOUNTER — Ambulatory Visit
Admission: RE | Admit: 2021-11-10 | Discharge: 2021-11-10 | Disposition: A | Discharge status: Home / Self Care | Payer: 59 | Source: Ambulatory Visit | Attending: Oncology | Admitting: Oncology

## 2021-11-10 DIAGNOSIS — C50412 Malignant neoplasm of upper-outer quadrant of left female breast: Secondary | ICD-10-CM | POA: Insufficient documentation

## 2021-11-10 DIAGNOSIS — R7989 Other specified abnormal findings of blood chemistry: Secondary | ICD-10-CM | POA: Insufficient documentation

## 2021-11-10 DIAGNOSIS — Z171 Estrogen receptor negative status [ER-]: Secondary | ICD-10-CM | POA: Insufficient documentation

## 2021-11-10 DIAGNOSIS — Z510 Encounter for antineoplastic radiation therapy: Secondary | ICD-10-CM | POA: Diagnosis not present

## 2021-11-10 IMAGING — US US ABDOMEN LIMITED
1 series · 14 of 25 positions shown · non-contrast
Comparison: None.

CLINICAL DATA: Elevated liver enzymes.

EXAM:
ULTRASOUND ABDOMEN LIMITED RIGHT UPPER QUADRANT

[Series 1: us abdomen limited · 0.23mm/px · 14 of 66 slices shown]
[im 1/66]
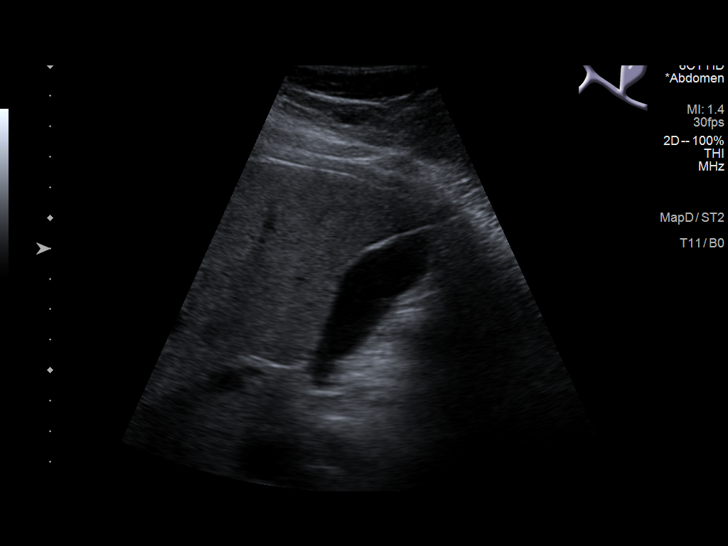
[im 6/66]
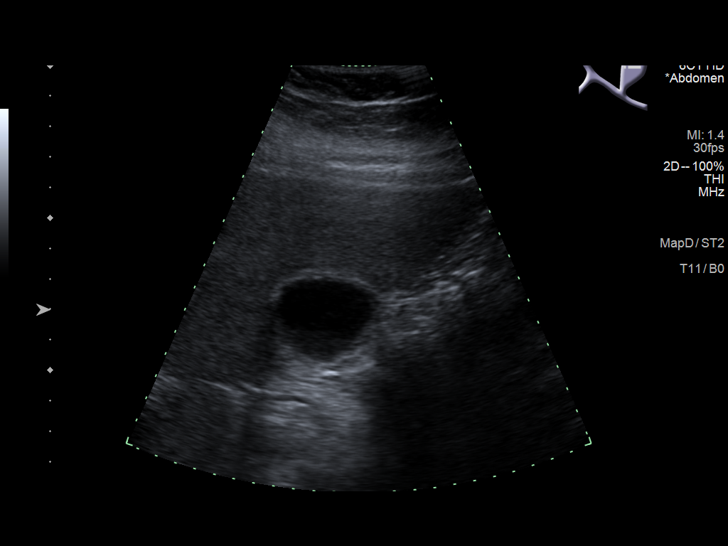
[im 11/66]
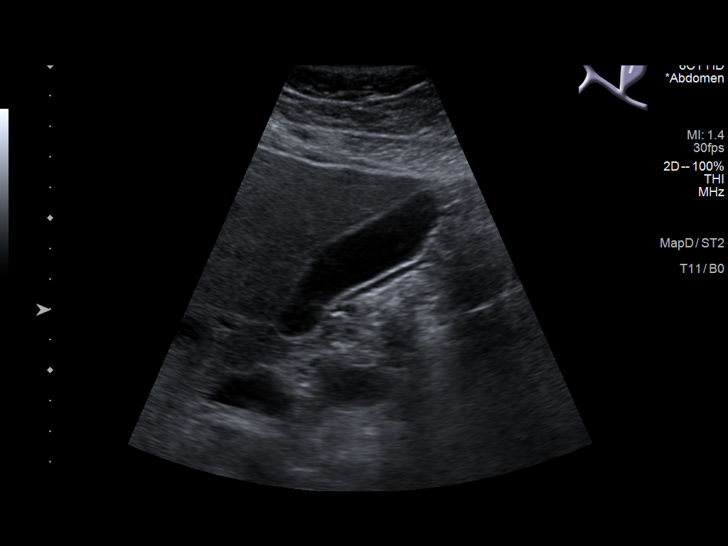
[im 17/66]
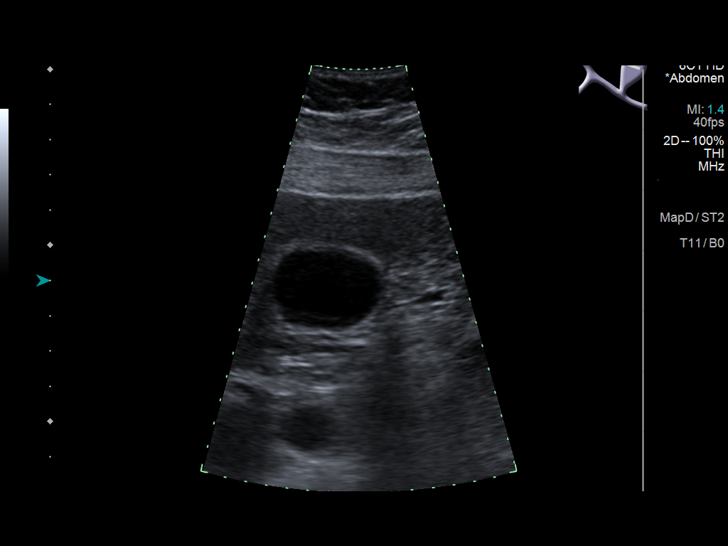
[im 22/66]
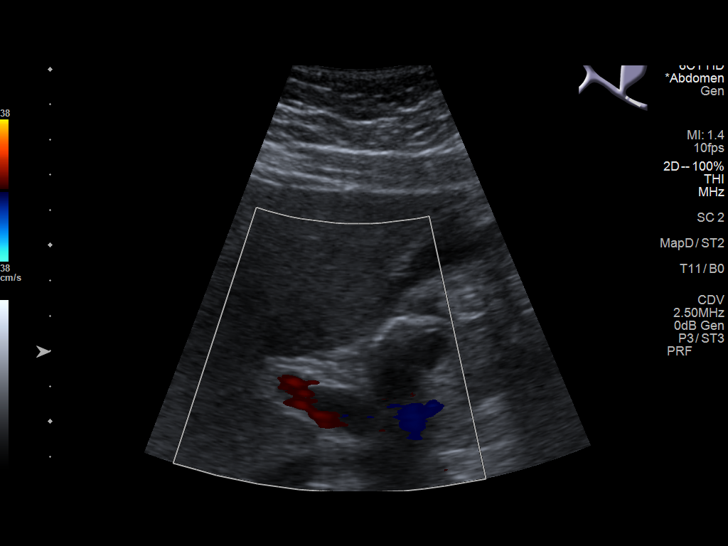
[im 25/66]
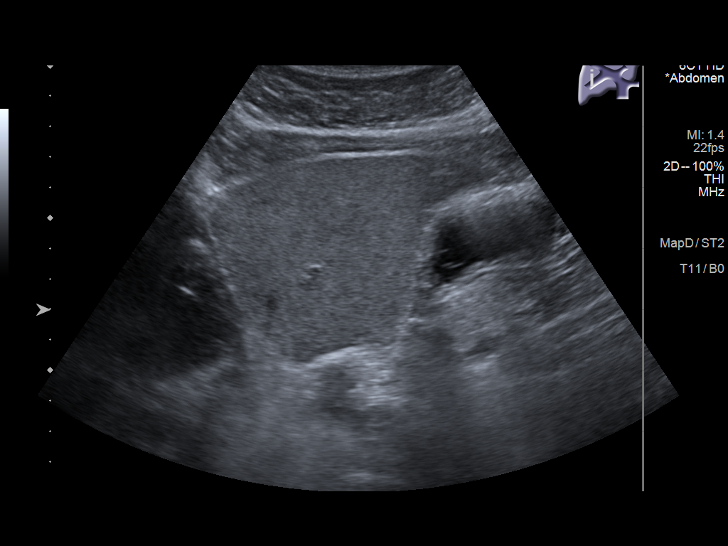
[im 30/66]
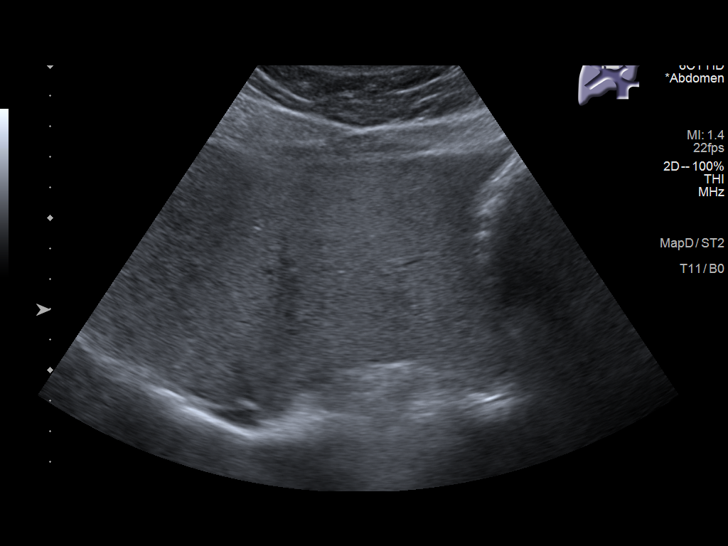
[im 36/66]
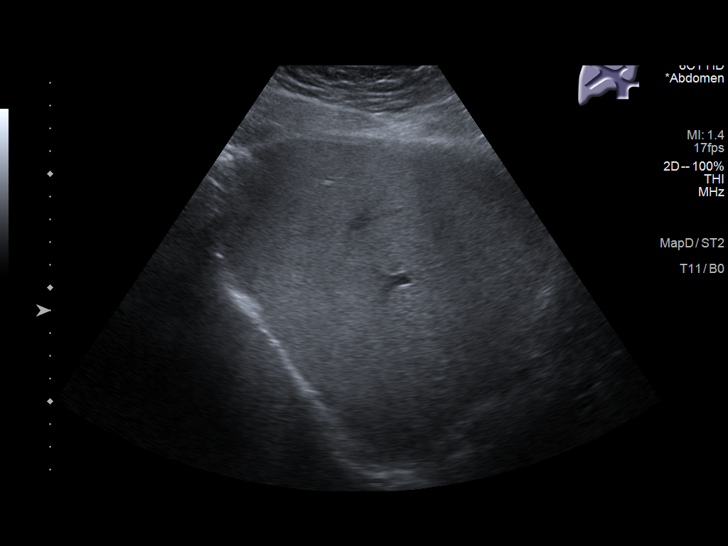
[im 41/66]
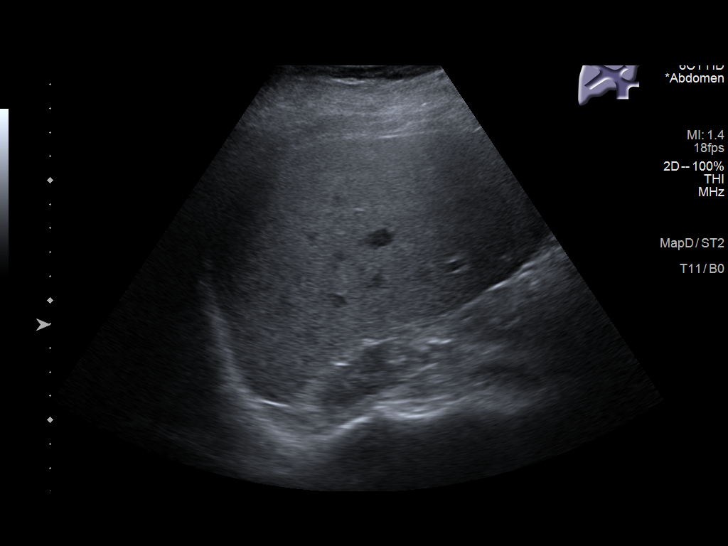
[im 44/66]
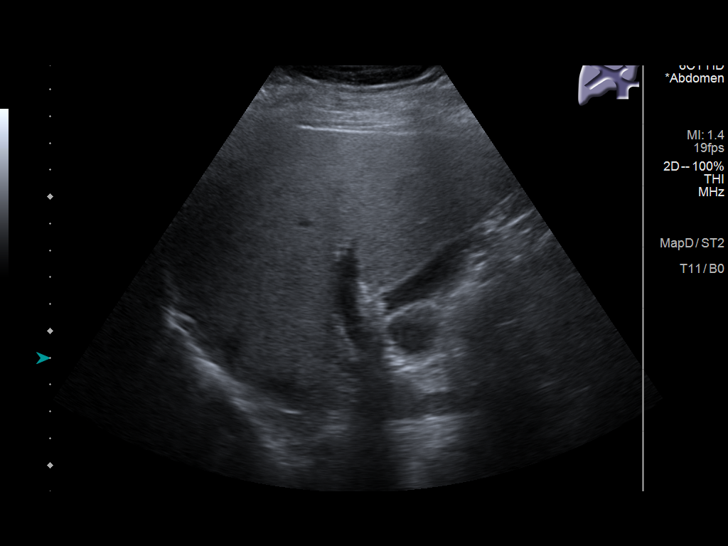
[im 49/66]
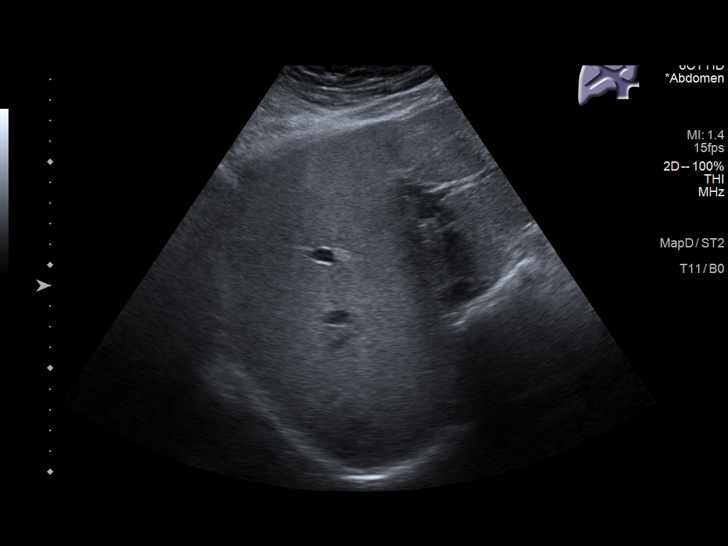
[im 55/66]
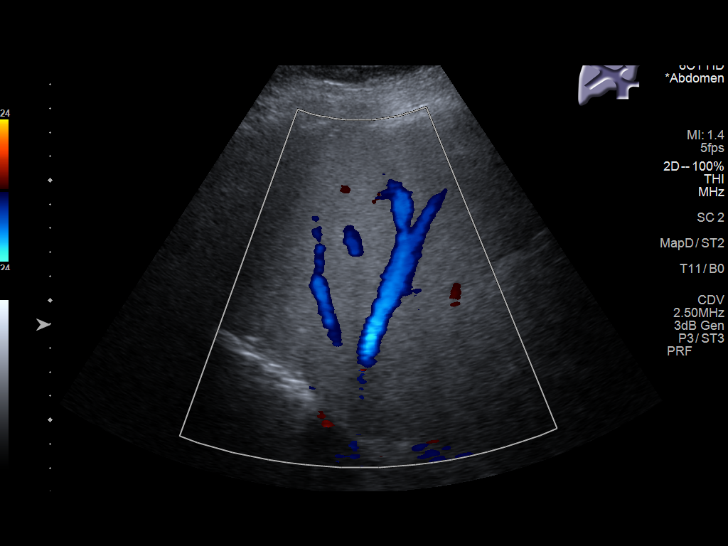
[im 60/66]
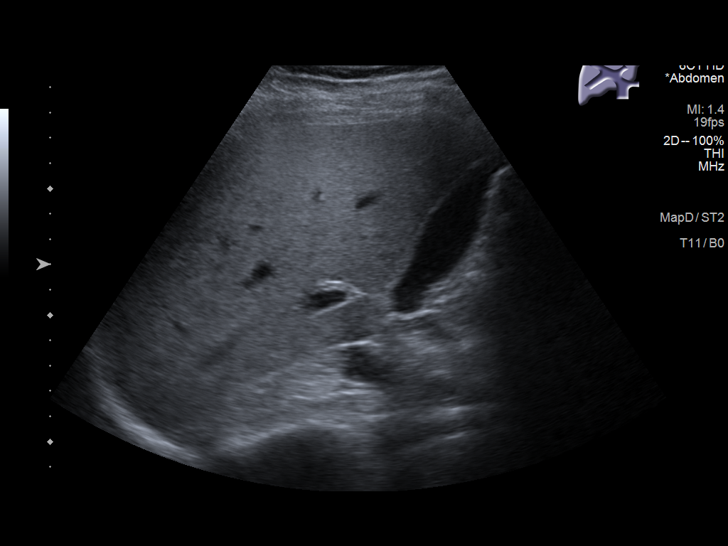
[im 66/66]
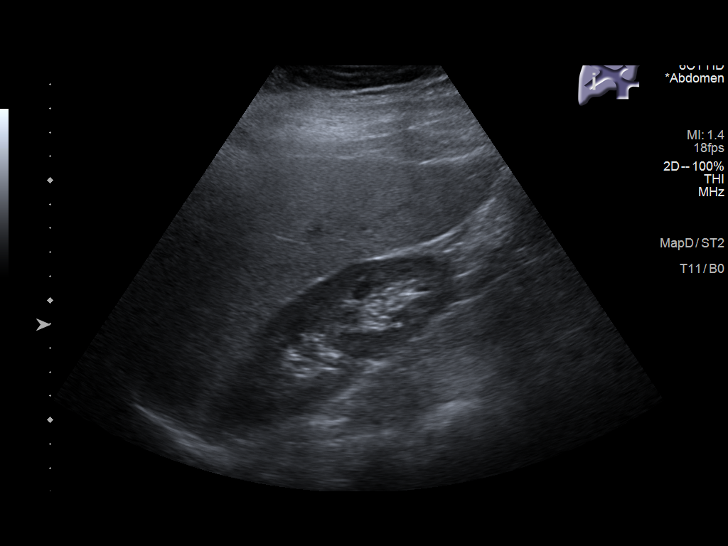

[14 of 25 positions shown; findings below may reference images not displayed]

FINDINGS: Gallbladder:

No gallstones or wall thickening visualized (1.8 mm). No sonographic
Murphy sign noted by sonographer.

Common bile duct:

Diameter: 3.0 mm

Liver:

No focal lesion identified. The liver parenchyma is heterogeneous in
echotexture and mildly increased in echogenicity. Portal vein is
patent on color Doppler imaging with normal direction of blood flow
towards the liver.

Other: None.
IMPRESSION: Hepatic steatosis without focal liver lesions.

## 2021-11-11 ENCOUNTER — Ambulatory Visit
Admission: RE | Admit: 2021-11-11 | Discharge: 2021-11-11 | Disposition: A | Discharge status: Home / Self Care | Payer: 59 | Source: Ambulatory Visit | Attending: Radiation Oncology | Admitting: Radiation Oncology

## 2021-11-11 ENCOUNTER — Inpatient Hospital Stay: Payer: 59

## 2021-11-11 DIAGNOSIS — Z510 Encounter for antineoplastic radiation therapy: Secondary | ICD-10-CM | POA: Diagnosis not present

## 2021-11-12 ENCOUNTER — Other Ambulatory Visit: Payer: Self-pay | Admitting: Oncology

## 2021-11-12 ENCOUNTER — Ambulatory Visit
Admission: RE | Admit: 2021-11-12 | Discharge: 2021-11-12 | Disposition: A | Discharge status: Home / Self Care | Payer: 59 | Source: Ambulatory Visit | Attending: Radiation Oncology | Admitting: Radiation Oncology

## 2021-11-12 ENCOUNTER — Other Ambulatory Visit: Payer: Self-pay

## 2021-11-12 ENCOUNTER — Inpatient Hospital Stay: Payer: 59

## 2021-11-12 ENCOUNTER — Other Ambulatory Visit: Payer: Self-pay | Admitting: Physician Assistant

## 2021-11-12 DIAGNOSIS — C50412 Malignant neoplasm of upper-outer quadrant of left female breast: Secondary | ICD-10-CM

## 2021-11-12 DIAGNOSIS — Z510 Encounter for antineoplastic radiation therapy: Secondary | ICD-10-CM | POA: Diagnosis not present

## 2021-11-13 ENCOUNTER — Inpatient Hospital Stay: Payer: 59

## 2021-11-13 ENCOUNTER — Ambulatory Visit
Admission: RE | Admit: 2021-11-13 | Discharge: 2021-11-13 | Disposition: A | Discharge status: Home / Self Care | Payer: 59 | Source: Ambulatory Visit | Attending: Radiation Oncology | Admitting: Radiation Oncology

## 2021-11-13 DIAGNOSIS — Z510 Encounter for antineoplastic radiation therapy: Secondary | ICD-10-CM | POA: Diagnosis not present

## 2021-11-14 ENCOUNTER — Ambulatory Visit
Admission: RE | Admit: 2021-11-14 | Discharge: 2021-11-14 | Disposition: A | Discharge status: Home / Self Care | Payer: 59 | Source: Ambulatory Visit | Attending: Radiation Oncology | Admitting: Radiation Oncology

## 2021-11-14 ENCOUNTER — Other Ambulatory Visit: Payer: Self-pay

## 2021-11-14 ENCOUNTER — Inpatient Hospital Stay: Payer: 59

## 2021-11-14 DIAGNOSIS — Z510 Encounter for antineoplastic radiation therapy: Secondary | ICD-10-CM | POA: Diagnosis not present

## 2021-11-17 ENCOUNTER — Other Ambulatory Visit: Payer: Self-pay | Admitting: *Deleted

## 2021-11-17 ENCOUNTER — Telehealth: Payer: Self-pay | Admitting: *Deleted

## 2021-11-17 ENCOUNTER — Ambulatory Visit
Admission: RE | Admit: 2021-11-17 | Discharge: 2021-11-17 | Disposition: A | Discharge status: Home / Self Care | Payer: 59 | Source: Ambulatory Visit | Attending: Radiation Oncology | Admitting: Radiation Oncology

## 2021-11-17 ENCOUNTER — Inpatient Hospital Stay: Payer: 59

## 2021-11-17 ENCOUNTER — Other Ambulatory Visit: Payer: Self-pay

## 2021-11-17 DIAGNOSIS — Z171 Estrogen receptor negative status [ER-]: Secondary | ICD-10-CM

## 2021-11-17 DIAGNOSIS — R7989 Other specified abnormal findings of blood chemistry: Secondary | ICD-10-CM

## 2021-11-17 DIAGNOSIS — Z510 Encounter for antineoplastic radiation therapy: Secondary | ICD-10-CM | POA: Diagnosis not present

## 2021-11-17 DIAGNOSIS — C50412 Malignant neoplasm of upper-outer quadrant of left female breast: Secondary | ICD-10-CM

## 2021-11-17 DIAGNOSIS — C50919 Malignant neoplasm of unspecified site of unspecified female breast: Secondary | ICD-10-CM

## 2021-11-17 LAB — CBC
HCT: 36.1 % (ref 36.0–46.0)
Hemoglobin: 11.9 g/dL — ABNORMAL LOW (ref 12.0–15.0)
MCH: 27.6 pg (ref 26.0–34.0)
MCHC: 33 g/dL (ref 30.0–36.0)
MCV: 83.8 fL (ref 80.0–100.0)
Platelets: 183 10*3/uL (ref 150–400)
RBC: 4.31 MIL/uL (ref 3.87–5.11)
RDW: 20.3 % — ABNORMAL HIGH (ref 11.5–15.5)
WBC: 5.9 10*3/uL (ref 4.0–10.5)
nRBC: 0 % (ref 0.0–0.2)

## 2021-11-17 LAB — COMPREHENSIVE METABOLIC PANEL
ALT: 1564 U/L — ABNORMAL HIGH (ref 0–44)
AST: 1631 U/L — ABNORMAL HIGH (ref 15–41)
Albumin: 3.9 g/dL (ref 3.5–5.0)
Alkaline Phosphatase: 432 U/L — ABNORMAL HIGH (ref 38–126)
Anion gap: 11 (ref 5–15)
BUN: 10 mg/dL (ref 6–20)
CO2: 22 mmol/L (ref 22–32)
Calcium: 9 mg/dL (ref 8.9–10.3)
Chloride: 102 mmol/L (ref 98–111)
Creatinine, Ser: 0.51 mg/dL (ref 0.44–1.00)
GFR, Estimated: >60 mL/min (ref >60)
Glucose, Bld: 90 mg/dL (ref 70–99)
Potassium: 3.8 mmol/L (ref 3.5–5.1)
Sodium: 135 mmol/L (ref 135–145)
Total Bilirubin: 0.7 mg/dL (ref 0.3–1.2)
Total Protein: 6.7 g/dL (ref 6.5–8.1)

## 2021-11-17 LAB — TSH: TSH: 2.052 u[IU]/mL (ref 0.350–4.500)

## 2021-11-17 MED ORDER — HEPARIN SOD (PORK) LOCK FLUSH 100 UNIT/ML IV SOLN
500.0000 [IU] | Freq: Once | INTRAVENOUS | Status: AC
  Administered 2021-11-17: 500 [IU] via INTRAVENOUS
  Filled 2021-11-17: qty 5

## 2021-11-17 MED ORDER — SODIUM CHLORIDE 0.9% FLUSH
10.0000 mL | Freq: Once | INTRAVENOUS | Status: AC
  Administered 2021-11-17: 10 mL via INTRAVENOUS
  Filled 2021-11-17: qty 10

## 2021-11-17 NOTE — Telephone Encounter (Signed)
Pt was told that she can't get treatment today due to the liver enzymes elevated. Pt was sent home. Dr. Janese Banks spoke to Dr. Vicente Males about this and he said he would send a message to [facility] to get appt for pt. I see the appt. Is tom. At 1:45 [facility]. . I did call the pt. Because she could not see the liver numbers. I went over them with pt. And told her that Janese Banks feels like it is from [place]. . Per Janese Banks  feels that she will not give her any more [place]. Dr. Janese Banks wants to see her back on 2/27 at 9:15 to get labs and then see Janese Banks. She is agreeable

## 2021-11-17 NOTE — Progress Notes (Signed)
Letter written for work

## 2021-11-18 ENCOUNTER — Other Ambulatory Visit: Payer: Self-pay | Admitting: *Deleted

## 2021-11-18 ENCOUNTER — Inpatient Hospital Stay: Payer: 59

## 2021-11-18 ENCOUNTER — Other Ambulatory Visit: Payer: Self-pay | Admitting: Oncology

## 2021-11-18 ENCOUNTER — Ambulatory Visit (INDEPENDENT_AMBULATORY_CARE_PROVIDER_SITE_OTHER): Payer: 59 | Admitting: Gastroenterology

## 2021-11-18 ENCOUNTER — Encounter: Payer: Self-pay | Admitting: Gastroenterology

## 2021-11-18 ENCOUNTER — Ambulatory Visit
Admission: RE | Admit: 2021-11-18 | Discharge: 2021-11-18 | Disposition: A | Discharge status: Home / Self Care | Payer: 59 | Source: Ambulatory Visit | Attending: Radiation Oncology | Admitting: Radiation Oncology

## 2021-11-18 VITALS — BP 99/67 | HR 94 | Temp 97.9°F | Ht 69.0 in | Wt 195.0 lb

## 2021-11-18 DIAGNOSIS — R7989 Other specified abnormal findings of blood chemistry: Secondary | ICD-10-CM | POA: Diagnosis not present

## 2021-11-18 DIAGNOSIS — Z510 Encounter for antineoplastic radiation therapy: Secondary | ICD-10-CM | POA: Diagnosis not present

## 2021-11-18 DIAGNOSIS — K719 Toxic liver disease, unspecified: Secondary | ICD-10-CM | POA: Diagnosis not present

## 2021-11-18 LAB — IRON AND TIBC
Iron: 163 ug/dL (ref 28–170)
Saturation Ratios: 29 % (ref 10.4–31.8)
TIBC: 554 ug/dL — ABNORMAL HIGH (ref 250–450)
UIBC: 391 ug/dL

## 2021-11-18 LAB — HEPATIC FUNCTION PANEL
ALT: 1973 U/L — ABNORMAL HIGH (ref 0–44)
AST: 2159 U/L — ABNORMAL HIGH (ref 15–41)
Albumin: 3.9 g/dL (ref 3.5–5.0)
Alkaline Phosphatase: 522 U/L — ABNORMAL HIGH (ref 38–126)
Bilirubin, Direct: 0.9 mg/dL — ABNORMAL HIGH (ref 0.0–0.2)
Indirect Bilirubin: 0.8 mg/dL (ref 0.3–0.9)
Total Bilirubin: 1.7 mg/dL — ABNORMAL HIGH (ref 0.3–1.2)
Total Protein: 6.7 g/dL (ref 6.5–8.1)

## 2021-11-18 LAB — PROTIME-INR
INR: 1.1 (ref 0.8–1.2)
Prothrombin Time: 14.2 seconds (ref 11.4–15.2)

## 2021-11-18 LAB — FERRITIN: Ferritin: 208 ng/mL (ref 11–307)

## 2021-11-18 LAB — CK: Total CK: 54 U/L (ref 38–234)

## 2021-11-18 LAB — HIV ANTIBODY (ROUTINE TESTING W REFLEX): HIV Screen 4th Generation wRfx: NONREACTIVE

## 2021-11-18 LAB — HEPATITIS C ANTIBODY: HCV Ab: NONREACTIVE

## 2021-11-18 LAB — GAMMA GT: GGT: 472 U/L — ABNORMAL HIGH (ref 7–50)

## 2021-11-18 LAB — HEPATITIS B CORE ANTIBODY, TOTAL: Hep B Core Total Ab: NONREACTIVE

## 2021-11-18 LAB — HEPATITIS B SURFACE ANTIGEN: Hepatitis B Surface Ag: NONREACTIVE

## 2021-11-18 MED ORDER — HEPARIN SOD (PORK) LOCK FLUSH 100 UNIT/ML IV SOLN
500.0000 [IU] | Freq: Once | INTRAVENOUS | Status: AC
  Administered 2021-11-18: 500 [IU] via INTRAVENOUS
  Filled 2021-11-18: qty 5

## 2021-11-18 NOTE — Addendum Note (Signed)
Addended by: Wayna Chalet on: 11/18/2021 02:38 PM   Modules accepted: Orders

## 2021-11-18 NOTE — Progress Notes (Addendum)
Jonathon Bellows MD, MRCP(U.K) [address]  [place]  [place], [place] [address]  Main: [phone]  Fax: [phone]   Gastroenterology Consultation  Referring Provider:    Dr.Rao Primary Care Physician:  Juluis Pitch, MD Primary Gastroenterologist:  Dr. Jonathon Bellows  Reason for Consultation:     Abnormal LFTs        HPI:   Vickie Mathis is a 31 y.o. y/o female referred for an urgent visit to discuss abnormal LFTs.  She has been seen by Dr. Janese Banks for breast cancer.  On Keytruda.  Last taken 6 weeks back.  Stopped.  It was stopped because she was noted to have abnormal liver function tests with predominantly elevated AST and ALT but normal total bilirubin.  The AST and ALT were in the 100s and when checked yesterday were over 1500.  Total bilirubin was normal.  No INR was checked.  Denies any OTC meds.  Denies any herbal supplements.  Some excess alcohol consumption on a birthday a few days back but not on a regular basis.  Denies any illegal drug use.  She has had multiple professional tattoos.  No incarceration.  No blood transfusions.  No Armed forces logistics/support/administrative officer.  No home grown mushrooms consumed.  For the past 1 week she has had some itching and general fatigue which has been longstanding but nothing new.  Some darkening of her urine.  Denies any fever. 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 AXILLARY NODE DISSECTION;  Surgeon: Herbert Pun, MD;  Location: [facility];  Service: General;  Laterality: Bilateral;  [place], 1.5  hours Cintron-Diaz 2.5 hours   BREAST BIOPSY Left 11/26/2020   vision 12:00 6cmfn [facility]   BREAST BIOPSY Left 11/26/2020   LN bx, hydro marker,  fragments of macrometastatic carcinoma   BREAST RECONSTRUCTION WITH PLACEMENT OF TISSUE EXPANDER AND FLEX HD (ACELLULAR HYDRATED DERMIS) Bilateral 06/25/2021   Procedure: BREAST RECONSTRUCTION WITH PLACEMENT OF TISSUE EXPANDER AND FLEX HD (ACELLULAR HYDRATED DERMIS);  Surgeon: Wallace Going, DO;  Location: [facility];  Service: Plastics;  Laterality: Bilateral;   PORTACATH PLACEMENT Right 12/13/2020   Procedure: INSERTION PORT-A-CATH;  Surgeon: Herbert Pun, MD;  Location: [facility];  Service: General;  Laterality: Right;   REMOVAL OF BILATERAL TISSUE EXPANDERS WITH PLACEMENT OF BILATERAL BREAST IMPLANTS Bilateral 09/15/2021   Procedure: REMOVAL OF BILATERAL TISSUE EXPANDERS;  Surgeon: Wallace Going, DO;  Location: [place];  Service: Plastics;  Laterality: Bilateral;    Prior to Admission medications   Medication Sig Start Date End Date Taking? Authorizing Provider  ondansetron (ZOFRAN) 8 MG tablet TAKE 1 TABLET (8 MG TOTAL) BY MOUTH EVERY 8 (EIGHT) HOURS AS NEEDED FOR REFRACTORY NAUSEA / VOMITING. START ON DAY 3 AFTER CHEMO. 11/12/21   Sindy Guadeloupe, MD  busPIRone (BUSPAR) 15 MG tablet Take 15 mg by mouth 2 (two) times daily. 10/27/20   [provider]  busPIRone (BUSPAR) 15 MG tablet Take 1 tablet by mouth 2 (  two) times daily. 09/18/21   [provider]  citalopram (CELEXA) 40 MG tablet Take 1 tablet (40 mg total) by mouth daily. Patient taking differently: Take 40 mg by mouth at bedtime. 04/04/21   Sindy Guadeloupe, MD  clonazePAM (KLONOPIN) 0.5 MG tablet TAKE 1 TABLET BY MOUTH 3 TIMES DAILY AS NEEDED. 11/12/21   Sindy Guadeloupe, MD  Cyanocobalamin (B-12 PO) Take 1 tablet by mouth daily.    [provider]  docusate sodium (COLACE) 100 MG capsule Take 100 mg by mouth daily as needed for mild  constipation.    [provider]  doxycycline (VIBRA-TABS) 100 MG tablet Take 1 tablet (100 mg total) by mouth 2 (two) times daily. 10/12/21   Lloyd Huger, MD  folic acid (FOLVITE) 1 MG tablet TAKE 2 TABLETS BY MOUTH EVERY DAY 08/30/21   Sindy Guadeloupe, MD  gabapentin (NEURONTIN) 300 MG capsule Take 1 capsule (300 mg total) by mouth 3 (three) times daily for 14 days. 10/29/21 11/12/21  Sindy Guadeloupe, MD  hydrOXYzine (VISTARIL) 25 MG capsule TAKE 1 CAPSULE (25 MG TOTAL) BY MOUTH AT BEDTIME AS NEEDED FOR UP TO 20 DOSES. 10/16/21   Corena Herter, PA-C  ibuprofen (ADVIL) 200 MG tablet Take 400 mg by mouth every 6 (six) hours as needed for moderate pain.    [provider]  lidocaine-prilocaine (EMLA) cream Apply 1 application topically daily as needed (prior to port access). 06/06/21   Borders, Kirt Boys, NP  methocarbamol (ROBAXIN) 500 MG tablet TAKE 1 TABLET BY MOUTH 3 TIMES DAILY FOR 14 DAYS. 10/29/21   Sindy Guadeloupe, MD  Multiple Vitamin (MULTIVITAMIN WITH MINERALS) TABS tablet Take 1 tablet by mouth daily.    [provider]  ondansetron (ZOFRAN ODT) 4 MG disintegrating tablet Take 1 tablet (4 mg total) by mouth every 8 (eight) hours as needed for nausea or vomiting. 10/12/21   Lloyd Huger, MD  ondansetron (ZOFRAN-ODT) 4 MG disintegrating tablet Take 1 tablet (4 mg total) by mouth every 8 (eight) hours as needed for nausea or vomiting. 09/12/21   Corena Herter, PA-C  potassium chloride SA (KLOR-CON M) 20 MEQ tablet Take 1 tablet (20 mEq total) by mouth daily. 09/17/21   Jacquelin Hawking, NP  triamcinolone ointment (KENALOG) 0.5 % Apply 1 application topically 2 (two) times daily. 02/04/21   Sindy Guadeloupe, MD  TURMERIC CURCUMIN PO Take 2 capsules by mouth daily.    [provider]    Family History  Problem Relation Age of Onset   Diabetes Father    Varicose Veins Father    Diabetes Paternal Aunt    Uterine cancer Paternal Aunt        precancerous    Diabetes Paternal Grandmother    Uterine cancer Paternal Grandmother        precancerous   Thyroid disease Mother    Thyroid disease Maternal Aunt    Thyroid disease Maternal Grandmother    Cancer Other        stomach vs ovarian/cervical   Cancer Paternal Great-grandmother        unk     Social History   Tobacco Use   Smoking status: Never   Smokeless tobacco: Never  Vaping Use   Vaping Use: Never used  Substance Use Topics   Alcohol use: Not Currently    Comment: occassinally    Drug use: Yes    Types: Marijuana    Comment: occ  Allergies as of 11/18/2021 - Review Complete 10/29/2021  Allergen Reaction Noted   Carboplatin Shortness Of Breath, Nausea And Vomiting, and Other (See Comments) 02/12/2021   Amoxicillin  12/03/2020   Sulfa antibiotics  12/03/2020    Review of Systems:    All systems reviewed and negative except where noted in HPI.   Physical Exam:  There were no vitals taken for this visit. No LMP recorded. (Menstrual status: Chemotherapy). Psych:  Alert and cooperative. Normal mood and affect. General:   Alert,  Well-developed, well-nourished, pleasant and cooperative in NAD Head:  Normocephalic and atraumatic. Eyes:  Sclera clear, no icterus.   Conjunctiva pink. Ears:  Normal auditory acuity.    Neurologic:  Alert and oriented x3;  grossly normal neurologically. Psych:  Alert and cooperative. Normal mood and affect.  Imaging Studies: US Abdomen Limited RUQ (LIVER/GB)  Result Date: 11/10/2021 CLINICAL DATA:  Elevated liver enzymes. EXAM: ULTRASOUND ABDOMEN LIMITED RIGHT UPPER QUADRANT COMPARISON:  None. FINDINGS: Gallbladder: No gallstones or wall thickening visualized (1.8 mm). No sonographic Murphy sign noted by sonographer. Common bile duct: Diameter: 3.0 mm Liver: No focal lesion identified. The liver parenchyma is heterogeneous in echotexture and mildly increased in echogenicity. Portal vein is patent on color Doppler imaging with normal  direction of blood flow towards the liver. Other: None. IMPRESSION: Hepatic steatosis without focal liver lesions. Electronically Signed   By: Virgina Norfolk M.D.   On: 11/10/2021 19:29    Assessment and Plan:   Yvetta Drotar is a 31 y.o. y/o female has been referred for abnormal LFTs.  Background of breast cancer treated with Keytruda.  Beginning of January noted mildly elevated transaminases with normal total bilirubin.  When checked yesterday transaminases are significantly elevated over 20 times upper limit of normal.  Total bilirubin was normal.  Transaminases can be elevated above 5 times upper limit of normal in 1 to 4% of patients.  1 to 2% of these patients can evolving to an immune mediated liver injury.  Usually occurs 1 to 3 months after starting treatment.  Often the pattern is hepatocellular but sometimes can be cholestatic.  In this patient the pattern is hepatocellular.  Liver injury would be grade 3-4  I have ordered labs for autoimmune hepatitis, viral hepatitis to rule out hepatitis B C and A.  If negative we will watch the total bilirubin and INR.  If abnormal then will need steroids.  I will give her a call tomorrow after I get the lab results to discuss next steps.  I will also obtain liver Doppler ultrasound  Spent over 45 minutes of time reviewing her chart chart formulating plan counseling.   Follow up in tomorrow with telephone visit to discuss lab results  Dr Jonathon Bellows MD,MRCP(U.K)    Addendum:   Labs returned around 7 pm and I noted that T bilirubin was elevated which was normal previously. AST/ALT still rising , rest of labs still in process. Reviewed literature and recommendations are for IV methylprednisolone and if does not respond in 3 days will need Mycophenolate mofetil or Tacrolimus which we do not give as an inpatient . In  addition feel she would need a liver biopsy and would best be done at a tertiary Center with expertise in liver histology . Spoke to  patient and advised to go to [facility] or [facility] . They will go tonight .  Dr Jonathon Bellows MD,MRCP [facility]) Gastroenterology/Hepatology Pager: [phone]

## 2021-11-19 ENCOUNTER — Inpatient Hospital Stay: Payer: 59

## 2021-11-19 ENCOUNTER — Telehealth: Payer: 59 | Admitting: Gastroenterology

## 2021-11-19 ENCOUNTER — Ambulatory Visit: Payer: 59

## 2021-11-19 LAB — ALPHA-1-ANTITRYPSIN: A-1 Antitrypsin, Ser: 185 mg/dL (ref 100–188)

## 2021-11-19 LAB — HEPATITIS B SURFACE ANTIBODY,QUALITATIVE: Hep B S Ab: REACTIVE — AB

## 2021-11-19 LAB — HEPATITIS A ANTIBODY, TOTAL: hep A Total Ab: REACTIVE — AB

## 2021-11-19 LAB — CERULOPLASMIN: Ceruloplasmin: 33.1 mg/dL (ref 19.0–39.0)

## 2021-11-19 LAB — HEPATITIS B E ANTIGEN: Hep B E Ag: NEGATIVE

## 2021-11-19 LAB — HEPATITIS B E ANTIBODY: Hep B E Ab: NEGATIVE

## 2021-11-19 LAB — VARICELLA ZOSTER ANTIBODY, IGM: Varicella-Zoster Ab, IgM: <0.91 {index} (ref 0.00–0.90)

## 2021-11-19 LAB — ANA: Anti Nuclear Antibody (ANA): NEGATIVE

## 2021-11-20 ENCOUNTER — Ambulatory Visit: Payer: 59

## 2021-11-20 ENCOUNTER — Telehealth: Payer: 59 | Admitting: Gastroenterology

## 2021-11-20 ENCOUNTER — Inpatient Hospital Stay: Payer: 59

## 2021-11-20 DIAGNOSIS — R7401 Elevation of levels of liver transaminase levels: Secondary | ICD-10-CM | POA: Insufficient documentation

## 2021-11-20 LAB — MITOCHONDRIAL/SMOOTH MUSCLE AB PNL
F-Actin IgG: 17 Units (ref 0–19)
Mitochondrial M2 Ab, IgG: <20 Units (ref 0.0–20.0)

## 2021-11-20 LAB — ANTI-MICROSOMAL ANTIBODY LIVER / KIDNEY: LKM1 Ab: 1.5 Units (ref 0.0–20.0)

## 2021-11-20 LAB — EBV AB TO VIRAL CAPSID AG PNL, IGG+IGM
EBV VCA IgG: >600 U/mL — ABNORMAL HIGH (ref 0.0–17.9)
EBV VCA IgM: <36 U/mL (ref 0.0–35.9)

## 2021-11-20 LAB — VARICELLA-ZOSTER BY PCR: Varicella-Zoster, PCR: NEGATIVE

## 2021-11-21 ENCOUNTER — Inpatient Hospital Stay: Payer: 59

## 2021-11-21 ENCOUNTER — Ambulatory Visit: Payer: 59

## 2021-11-21 ENCOUNTER — Encounter: Payer: Self-pay | Admitting: Oncology

## 2021-11-21 ENCOUNTER — Encounter: Payer: Self-pay | Admitting: Gastroenterology

## 2021-11-21 LAB — EPSTEIN BARR VRS(EBV DNA BY PCR): EBV DNA QN by PCR: NEGATIVE IU/mL

## 2021-11-22 LAB — HSV AND VZV PCR PANEL
HSV 1 DNA: NEGATIVE
HSV 2 DNA: NEGATIVE
Varicella-Zoster, PCR: NEGATIVE

## 2021-11-24 ENCOUNTER — Telehealth: Payer: 59 | Admitting: Gastroenterology

## 2021-11-24 ENCOUNTER — Inpatient Hospital Stay: Payer: 59

## 2021-11-24 ENCOUNTER — Ambulatory Visit: Payer: 59

## 2021-11-24 ENCOUNTER — Other Ambulatory Visit: Payer: 59

## 2021-11-24 ENCOUNTER — Other Ambulatory Visit: Payer: Self-pay

## 2021-11-24 ENCOUNTER — Ambulatory Visit: Payer: 59 | Admitting: Oncology

## 2021-11-24 DIAGNOSIS — R7989 Other specified abnormal findings of blood chemistry: Secondary | ICD-10-CM

## 2021-11-24 DIAGNOSIS — C50412 Malignant neoplasm of upper-outer quadrant of left female breast: Secondary | ICD-10-CM

## 2021-11-25 ENCOUNTER — Ambulatory Visit: Payer: 59

## 2021-11-25 ENCOUNTER — Telehealth: Payer: Self-pay | Admitting: *Deleted

## 2021-11-25 ENCOUNTER — Inpatient Hospital Stay: Payer: 59

## 2021-11-25 ENCOUNTER — Telehealth: Payer: 59 | Admitting: Gastroenterology

## 2021-11-25 NOTE — Telephone Encounter (Signed)
Patient called asking when you want her to resume her radiation therapy treatments. Please return her call

## 2021-11-25 NOTE — Telephone Encounter (Addendum)
Returned call to Tavie.  She prefers to restart radiation on Monday and to just rest this week after her hospitalization at [facility].

## 2021-11-26 ENCOUNTER — Encounter: Payer: Self-pay | Admitting: *Deleted

## 2021-11-26 ENCOUNTER — Other Ambulatory Visit: Payer: Self-pay | Admitting: Oncology

## 2021-11-26 ENCOUNTER — Ambulatory Visit: Payer: 59

## 2021-11-26 ENCOUNTER — Inpatient Hospital Stay: Payer: 59

## 2021-11-26 ENCOUNTER — Telehealth: Payer: Self-pay | Admitting: *Deleted

## 2021-11-26 DIAGNOSIS — Z171 Estrogen receptor negative status [ER-]: Secondary | ICD-10-CM

## 2021-11-26 DIAGNOSIS — R7401 Elevation of levels of liver transaminase levels: Secondary | ICD-10-CM

## 2021-11-26 NOTE — Telephone Encounter (Signed)
Pt had got out of [facility] hospital and wanted to start back with radiation on Monday. Dr. Janese Banks is good with that and she will need port labs , then see Dr. Janese Banks and after that she will get radiation. There is transportation set up and all the appts and time and dates I sent on my chart. Shazia says she can see and she will be here [place]

## 2021-11-27 ENCOUNTER — Ambulatory Visit: Payer: 59

## 2021-11-27 ENCOUNTER — Inpatient Hospital Stay: Payer: 59

## 2021-11-28 ENCOUNTER — Inpatient Hospital Stay: Payer: 59

## 2021-11-28 ENCOUNTER — Ambulatory Visit: Payer: 59

## 2021-12-01 ENCOUNTER — Ambulatory Visit
Admission: RE | Admit: 2021-12-01 | Discharge: 2021-12-01 | Disposition: A | Discharge status: Home / Self Care | Payer: 59 | Source: Ambulatory Visit | Attending: Radiation Oncology | Admitting: Radiation Oncology

## 2021-12-01 ENCOUNTER — Inpatient Hospital Stay (HOSPITAL_BASED_OUTPATIENT_CLINIC_OR_DEPARTMENT_OTHER): Payer: 59 | Admitting: Oncology

## 2021-12-01 ENCOUNTER — Other Ambulatory Visit: Payer: Self-pay

## 2021-12-01 ENCOUNTER — Inpatient Hospital Stay: Payer: 59

## 2021-12-01 ENCOUNTER — Encounter: Payer: Self-pay | Admitting: Oncology

## 2021-12-01 ENCOUNTER — Telehealth: Payer: Self-pay | Admitting: *Deleted

## 2021-12-01 ENCOUNTER — Ambulatory Visit: Payer: 59

## 2021-12-01 VITALS — BP 106/57 | HR 91 | Temp 96.6°F | Resp 20 | Wt 195.3 lb

## 2021-12-01 DIAGNOSIS — R7401 Elevation of levels of liver transaminase levels: Secondary | ICD-10-CM

## 2021-12-01 DIAGNOSIS — C50412 Malignant neoplasm of upper-outer quadrant of left female breast: Secondary | ICD-10-CM | POA: Diagnosis not present

## 2021-12-01 DIAGNOSIS — T50905A Adverse effect of unspecified drugs, medicaments and biological substances, initial encounter: Secondary | ICD-10-CM

## 2021-12-01 DIAGNOSIS — Z510 Encounter for antineoplastic radiation therapy: Secondary | ICD-10-CM | POA: Diagnosis not present

## 2021-12-01 DIAGNOSIS — K716 Toxic liver disease with hepatitis, not elsewhere classified: Secondary | ICD-10-CM

## 2021-12-01 DIAGNOSIS — Z171 Estrogen receptor negative status [ER-]: Secondary | ICD-10-CM

## 2021-12-01 LAB — COMPREHENSIVE METABOLIC PANEL
ALT: 1149 U/L — ABNORMAL HIGH (ref 0–44)
AST: 665 U/L — ABNORMAL HIGH (ref 15–41)
Albumin: 3.2 g/dL — ABNORMAL LOW (ref 3.5–5.0)
Alkaline Phosphatase: 566 U/L — ABNORMAL HIGH (ref 38–126)
Anion gap: 10 (ref 5–15)
BUN: 15 mg/dL (ref 6–20)
CO2: 25 mmol/L (ref 22–32)
Calcium: 8.4 mg/dL — ABNORMAL LOW (ref 8.9–10.3)
Chloride: 100 mmol/L (ref 98–111)
Creatinine, Ser: 0.62 mg/dL (ref 0.44–1.00)
GFR, Estimated: >60 mL/min (ref >60)
Glucose, Bld: 174 mg/dL — ABNORMAL HIGH (ref 70–99)
Potassium: 3.8 mmol/L (ref 3.5–5.1)
Sodium: 135 mmol/L (ref 135–145)
Total Bilirubin: 3.6 mg/dL — ABNORMAL HIGH (ref 0.3–1.2)
Total Protein: 6.2 g/dL — ABNORMAL LOW (ref 6.5–8.1)

## 2021-12-01 LAB — GAMMA GT: GGT: 841 U/L — ABNORMAL HIGH (ref 7–50)

## 2021-12-01 MED ORDER — SODIUM CHLORIDE 0.9% FLUSH
10.0000 mL | Freq: Once | INTRAVENOUS | Status: AC
  Administered 2021-12-01: 10 mL via INTRAVENOUS
  Filled 2021-12-01: qty 10

## 2021-12-01 MED ORDER — OXYCODONE HCL 5 MG PO TABS: 5.0000 mg | ORAL_TABLET | Freq: Every day | ORAL | 0 refills | Status: AC | PRN

## 2021-12-01 MED ORDER — GABAPENTIN 300 MG PO CAPS: 300.0000 mg | ORAL_CAPSULE | Freq: Three times a day (TID) | ORAL | 3 refills | Status: DC

## 2021-12-01 MED ORDER — HEPARIN SOD (PORK) LOCK FLUSH 100 UNIT/ML IV SOLN
500.0000 [IU] | Freq: Once | INTRAVENOUS | Status: AC
  Administered 2021-12-01: 500 [IU] via INTRAVENOUS
  Filled 2021-12-01: qty 5

## 2021-12-01 MED ORDER — ONDANSETRON HCL 8 MG PO TABS: 8.0000 mg | ORAL_TABLET | Freq: Three times a day (TID) | ORAL | 3 refills | Status: DC | PRN

## 2021-12-01 NOTE — Progress Notes (Signed)
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  Ultrasound of the left axilla demonstrates 2 lymph nodes with mild thickened cortices of 4 mm.  There is skin thickening on the lower inner quadrant of the left breast on mammography.  Both the breast mass and the lymph node were biopsied and was positive for invasive mammary carcinoma grade 3.  Lymph node was also suspicious for extracapsular extension.    ER/PR and HER-2 negative   MRI showed irregular enhancing mass at the 12 o'clock position of the left breast measuring 2.5 x 2.3 cm and a linear component extending 2.3 cm anteriorly.  The mass in the anterior linear extension combined measure 4.3 cm.  3.9 cm in the cephalocaudal dimension.  Also an area of clumped non-mass enhancement in the posterior aspect of the lower quadrant of the left breast measuring 2.6 x 0.9 cm which looks suspicious.  2 enlarged left axillary lymph nodes and 2 mildly enlarged  internal mammary lymph nodes.  4.3 cm clumped area of non-mass enhancement in the outer quadrant of the right breast.  3 right axillary lymph nodes with mild cortical thickening.   Patient underwent biopsy of the right breast non-mass enhancement and that was negative for malignancy   CT scan showed mildly enlarged right inguinal lymph nodes nonspecific.  Left upper breast mass with prominent left axillary lymph nodes.  Subcentimeter pulmonary nodules which are calcified and compatible with benign old granulomatous disease.  0.6 5.4 cm lucent lesion in the left first rib possibly a hemangioma or benign lesion.   Patient developed significant infusion reaction to carboplatin with dose 9 when her blood pressure dropped and she became tachycardic and significantly nauseous.  She went on to complete [facility] Keytruda chemotherapy as per keynote 522 regimen.  Patient underwent bilateral mastectomy with reconstruction in September 2022.  Final pathology showed complete pathological response2 sentinel lymph nodes negative for malignancy.  No malignancy noted in the right breast.   Plan was to complete 9 cycles of adjuvant Keytruda.  Patient noted to have abnormal LFTs on Keytruda.  Last dose of Keytruda given on 10/08/2021.  Subsequently her LFTs were significantly elevated and patient did not receive any further Keytruda.  She was admitted to [facility] for grade 4 hepatitis likely secondary to [facility] and was started on Solu-Medrol and is currently on prednisone taper  Interval history-patient was on prednisone 100 mg and will be going down to 80 mg.  Her taper is reducing the dose by 20 mg every week.  So far tolerating steroids well and she is on dapsone prophylaxis as  She does report trouble sleeping at night.  Reports ongoing fatigue.  She is undergoing adjuvant radiation treatment.  Reports occasional headaches ° °ECOG PS- 1 °Pain scale- 0 ° ° °Review of systems- Review of Systems  °Constitutional:  Positive for  malaise/fatigue. Negative for chills, fever and weight loss.  °HENT:  Negative for congestion, ear discharge and nosebleeds.   °Eyes:  Negative for blurred vision.  °Respiratory:  Negative for cough, hemoptysis, sputum production, shortness of breath and wheezing.   °Cardiovascular:  Negative for chest pain, palpitations, orthopnea and claudication.  °Gastrointestinal:  Negative for abdominal pain, blood in stool, constipation, diarrhea, heartburn, melena, nausea and vomiting.  °Genitourinary:  Negative for dysuria, flank pain, frequency, hematuria and urgency.  °Musculoskeletal:  Negative for back pain, joint pain and myalgias.  °Skin:  Negative for rash.  °Neurological:  Positive for headaches. Negative for dizziness, tingling, focal weakness, seizures and weakness.  °Endo/Heme/Allergies:  Does not bruise/bleed easily.  °Psychiatric/Behavioral:  Negative for depression and suicidal ideas. The patient has insomnia.    ° ° ° °Allergies  °Allergen Reactions  ° Carboplatin Shortness Of Breath, Nausea And Vomiting and Other (See Comments)  °  Flushing- chest , face, neck , arm including hand  ° Amoxicillin   °  Other reaction(s): "too young to remember what they do to me"  ° Sulfa Antibiotics   °  Other reaction(s): "too young to remember what they do to me"  ° ° ° °Past Medical History:  °Diagnosis Date  ° Anxiety   ° Asthma   ° Breast cancer (HCC) 11/2020  ° 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 breast ca  ° Depression   ° Family history of cancer   ° History of chemotherapy   ° Varicose veins of bilateral lower extremities with pain   ° ° ° °Past Surgical History:  °Procedure Laterality Date  ° APPENDECTOMY  2018  ° BILATERAL TOTAL MASTECTOMY WITH AXILLARY LYMPH NODE DISSECTION Bilateral 06/25/2021  ° Procedure: BILATERAL TOTAL MASTECTOMY WITH LEFT AXILLARY LYMPH NODE BIOPSY VS. AXILLARY NODE DISSECTION;  Surgeon: Cintron-Diaz, Edgardo, MD;  Location: [facility] ORS;  Service: General;  Laterality: Bilateral;  Dillingham, 1.5  hours °Cintron-Diaz 2.5 hours  ° BREAST BIOPSY Left 11/26/2020  ° vision 12:00 6cmfn I[facility]  ° BREAST BIOPSY Left 11/26/2020  ° LN bx, hydro marker,  fragments of macrometastatic carcinoma  ° BREAST RECONSTRUCTION WITH PLACEMENT OF TISSUE EXPANDER AND FLEX HD (ACELLULAR HYDRATED DERMIS) Bilateral 06/25/2021  ° Procedure: BREAST RECONSTRUCTION WITH PLACEMENT OF TISSUE EXPANDER AND FLEX HD (ACELLULAR HYDRATED DERMIS);  Surgeon: Dillingham, Claire S, DO;  Location: [facility] ORS;  Service: Plastics;  Laterality: Bilateral;  ° PORTACATH PLACEMENT Right 12/13/2020  ° Procedure: INSERTION PORT-A-CATH;  Surgeon: Cintron-Diaz, Edgardo, MD;  Location: [facility] ORS;  Service: General;  Laterality: Right;  ° REMOVAL OF BILATERAL TISSUE EXPANDERS WITH PLACEMENT OF BILATERAL BREAST IMPLANTS Bilateral 09/15/2021  ° Procedure: REMOVAL OF BILATERAL TISSUE EXPANDERS;  Surgeon: Dillingham, Claire S, DO;  Location: [facility];  Service: Plastics;  Laterality: Bilateral;  ° ° °Social History  ° °Socioeconomic History  ° Marital status: Married  °  Spouse name: Not on file  ° Number of children: Not on file  ° Years of education: Not on file  ° Highest education level: Not on file  °Occupational History  ° Not on file  °Tobacco Use  ° Smoking status: Never  ° Smokeless tobacco: Never  °Vaping Use  ° Vaping Use: Never used  °Substance and Sexual Activity  °   Alcohol use: Not Currently  °  Comment: occassinally   ° Drug use: Yes  °  Types: Marijuana  °  Comment: occ  ° Sexual activity: Yes  °Other Topics Concern  ° Not on file  °Social History Narrative  ° Not on file  ° °Social Determinants of Health  ° °Financial Resource Strain: Not on file  °Food Insecurity: Not on file  °Transportation Needs: Not on file  °Physical Activity: Not on file  °Stress: Not on file  °Social Connections: Not on file  °Intimate Partner Violence: Not on file  ° ° °Family History  °Problem Relation Age of Onset  ° Diabetes Father   ° Varicose Veins Father   °  Diabetes Paternal Aunt   ° Uterine cancer Paternal Aunt   °     precancerous  ° Diabetes Paternal Grandmother   ° Uterine cancer Paternal Grandmother   °     precancerous  ° Thyroid disease Mother   ° Thyroid disease Maternal Aunt   ° Thyroid disease Maternal Grandmother   ° Cancer Other   °     stomach vs ovarian/cervical  ° Cancer Paternal Great-grandmother   °     unk  ° ° ° °Current Outpatient Medications:  °  busPIRone (BUSPAR) 15 MG tablet, Take 15 mg by mouth 2 (two) times daily., Disp: , Rfl:  °  citalopram (CELEXA) 40 MG tablet, Take 1 tablet (40 mg total) by mouth daily. (Patient taking differently: Take 40 mg by mouth at bedtime.), Disp: 90 tablet, Rfl: 2 °  clonazePAM (KLONOPIN) 0.5 MG tablet, TAKE 1 TABLET BY MOUTH 3 TIMES DAILY AS NEEDED., Disp: 60 tablet, Rfl: 0 °  dapsone 100 MG tablet, Take 100 mg by mouth daily., Disp: , Rfl:  °  doxycycline (VIBRA-TABS) 100 MG tablet, Take 1 tablet (100 mg total) by mouth 2 (two) times daily., Disp: 14 tablet, Rfl: 0 °  folic acid (FOLVITE) 1 MG tablet, TAKE 2 TABLETS BY MOUTH EVERY DAY, Disp: 180 tablet, Rfl: 0 °  hydrOXYzine (VISTARIL) 25 MG capsule, TAKE 1 CAPSULE (25 MG TOTAL) BY MOUTH AT BEDTIME AS NEEDED FOR UP TO 20 DOSES., Disp: 20 capsule, Rfl: 0 °  ibuprofen (ADVIL) 200 MG tablet, Take 400 mg by mouth every 6 (six) hours as needed for moderate pain., Disp: , Rfl:  °  lidocaine-prilocaine (EMLA) cream, Apply 1 application topically daily as needed (prior to port access)., Disp: 30 g, Rfl: 3 °  methocarbamol (ROBAXIN) 500 MG tablet, TAKE 1 TABLET BY MOUTH 3 TIMES DAILY FOR 14 DAYS., Disp: 42 tablet, Rfl: 0 °  Multiple Vitamin (MULTIVITAMIN WITH MINERALS) TABS tablet, Take 1 tablet by mouth daily., Disp: , Rfl:  °  ondansetron (ZOFRAN ODT) 4 MG disintegrating tablet, Take 1 tablet (4 mg total) by mouth every 8 (eight) hours as needed for nausea or vomiting., Disp: 30 tablet, Rfl: 2 °  ondansetron (ZOFRAN-ODT) 4 MG disintegrating tablet, Take 1 tablet (4 mg  total) by mouth every 8 (eight) hours as needed for nausea or vomiting., Disp: 20 tablet, Rfl: 0 °  pantoprazole (PROTONIX) 40 MG tablet, Take 40 mg by mouth daily., Disp: , Rfl:  °  predniSONE (DELTASONE) 10 MG tablet, Take by mouth., Disp: , Rfl:  °  triamcinolone ointment (KENALOG) 0.5 %, Apply 1 application topically 2 (two) times daily., Disp: 30 g, Rfl: 0 °  Cyanocobalamin (B-12 PO), Take 1 tablet by mouth daily., Disp: , Rfl:  °  docusate sodium (  docusate sodium (COLACE) 100 MG capsule, Take 100 mg by mouth daily as needed for mild constipation. (Patient not taking: Reported on 12/01/2021), Disp: , Rfl:    gabapentin (NEURONTIN) 300 MG capsule, Take 1 capsule (300 mg total) by mouth 3 (three) times daily for 14 days., Disp: 42 capsule, Rfl: 3   ondansetron (ZOFRAN) 8 MG tablet, Take 1 tablet (8 mg total) by mouth every 8 (eight) hours as needed for refractory nausea / vomiting. Start on day 3 after chemo., Disp: 60 tablet, Rfl: 3   potassium chloride SA (KLOR-CON M) 20 MEQ tablet, Take 1 tablet (20 mEq total) by mouth daily., Disp: 7 tablet, Rfl: 0   TURMERIC CURCUMIN PO, Take 2 capsules by mouth daily. (Patient not taking: Reported on 12/01/2021), Disp: , Rfl:  No current facility-administered medications for this visit.  Facility-Administered Medications Ordered in Other Visits:    prochlorperazine (COMPAZINE) tablet 10 mg, 10 mg, Oral, Q6H PRN, Sindy Guadeloupe, MD, 10 mg at 07/16/21 1407  Physical exam:  Vitals:   12/01/21 0940  BP: (!) 106/57  Pulse: 91  Resp: 20  Temp: (!) 96.6 F (35.9 C)  SpO2: 97%  Weight: 195 lb 4.8 oz (88.6 kg)   Physical Exam Constitutional:      General: She is not in acute distress. Eyes:     Comments: Scleral icterus noted  Cardiovascular:     Rate and Rhythm: Normal rate and regular rhythm.     Heart sounds: Normal heart sounds.  Pulmonary:     Effort: Pulmonary effort is normal.     Breath sounds: Normal breath sounds.  Skin:    General: Skin is warm and dry.   Neurological:     Mental Status: She is alert and oriented to person, place, and time.     CMP Latest Ref Rng & Units 12/01/2021  Glucose 70 - 99 mg/dL 174(H)  BUN 6 - 20 mg/dL 15  Creatinine 0.44 - 1.00 mg/dL 0.62  Sodium 135 - 145 mmol/L 135  Potassium 3.5 - 5.1 mmol/L 3.8  Chloride 98 - 111 mmol/L 100  CO2 22 - 32 mmol/L 25  Calcium 8.9 - 10.3 mg/dL 8.4(L)  Total Protein 6.5 - 8.1 g/dL 6.2(L)  Total Bilirubin 0.3 - 1.2 mg/dL 3.6(H)  Alkaline Phos 38 - 126 U/L 566(H)  AST 15 - 41 U/L 665(H)  ALT 0 - 44 U/L 1,149(H)   CBC Latest Ref Rng & Units 11/17/2021  WBC 4.0 - 10.5 K/uL 5.9  Hemoglobin 12.0 - 15.0 g/dL 11.9(L)  Hematocrit 36.0 - 46.0 % 36.1  Platelets 150 - 400 K/uL 183    No images are attached to the encounter.  US Abdomen Limited RUQ (LIVER/GB)  Result Date: 11/10/2021 CLINICAL DATA:  Elevated liver enzymes. EXAM: ULTRASOUND ABDOMEN LIMITED RIGHT UPPER QUADRANT COMPARISON:  None. FINDINGS: Gallbladder: No gallstones or wall thickening visualized (1.8 mm). No sonographic Murphy sign noted by sonographer. Common bile duct: Diameter: 3.0 mm Liver: No focal lesion identified. The liver parenchyma is heterogeneous in echotexture and mildly increased in echogenicity. Portal vein is patent on color Doppler imaging with normal direction of blood flow towards the liver. Other: None. IMPRESSION: Hepatic steatosis without focal liver lesions. Electronically Signed   By: Virgina Norfolk M.D.   On: 11/10/2021 19:29     Assessment and plan- Patient is a 31 y.o. female with stage IIIb triple negative breast cancer of the left breast cT2 N1 M0.  She is s/p neoadjuvant chemotherapy as  522 protocol followed by complete pathological response.  She is s/p 4 cycles of adjuvant Keytruda complicated by grade 4 Keytruda induced hepatitis here for routine follow-up ° °AST ALT remain elevated but have decreased as compared to what it was 2 weeks ago before she went to [facility].She did have  LFTs checked at [facility] last week as well.  Her AST today 665 as compared to 550 a week ago, ALT 1149 as compared to 944 a week ago and bilirubin that has gone up to 3.6 as compared to 2.8 a week ago.  She will proceed with her steroid taper as planned by [facility] at this time and I will be getting in touch with Dr. Berg from [facility] hepatology for further recommendations.  I will have her come back for a repeat CMP GGT and PT/INR check later this week as well as on a weekly basis and see her back in 3 weeks.  We will reach out to [facility] with her LFTs for further recommendations regarding steroids and if she needs any second line management for her hepatitis ° °Patient will not get any further Keytruda at this time given grade 4 hepatitis.  She will continue with her ongoing adjuvant radiation therapy ° °Headaches: I have given her oxycodone that she can use up to 1 dose per day given her ongoing hepatitis and inability to use Tylenol or NSAIDs °  °Visit Diagnosis °1. Drug-induced hepatitis   °2. Malignant neoplasm of upper-outer quadrant of left breast in female, estrogen receptor negative (HCC)   ° ° ° °Dr.  , MD, MPH °[facility] °[phone] °12/01/2021 °12:52 PM ° ° ° ° ° ° °    ° ° ° ° ° °

## 2021-12-01 NOTE — Telephone Encounter (Signed)
Patient in room this morning about labs. Pt had asked about sending message to Ulice Dash to help pay rent for this month. I sent message to Juliann Pulse and this was her response:  Hi Vickie Mathis   She only has $225 remaining on her [employer] as we paid her rent last month of $775.   I can submit a request for that amount.   Teresita - I know you have spoken with Ms. Tith before.  Are there additional resources that you could share with her for this ask?   I did call pt and let her know that she can send 225 dollars to it but that is all they can help based on rules of asst for cancer center pt's. She will check with her husband because she was not aware that they helped pay last month

## 2021-12-02 ENCOUNTER — Inpatient Hospital Stay: Payer: 59

## 2021-12-02 ENCOUNTER — Encounter: Payer: Self-pay | Admitting: *Deleted

## 2021-12-02 ENCOUNTER — Ambulatory Visit
Admission: RE | Admit: 2021-12-02 | Discharge: 2021-12-02 | Disposition: A | Discharge status: Home / Self Care | Payer: 59 | Source: Ambulatory Visit | Attending: Radiation Oncology | Admitting: Radiation Oncology

## 2021-12-02 ENCOUNTER — Other Ambulatory Visit: Payer: Self-pay | Admitting: *Deleted

## 2021-12-02 DIAGNOSIS — Z510 Encounter for antineoplastic radiation therapy: Secondary | ICD-10-CM | POA: Diagnosis not present

## 2021-12-02 MED ORDER — TRAZODONE HCL 50 MG PO TABS: 25.0000 mg | ORAL_TABLET | Freq: Every evening | ORAL | 0 refills | Status: DC | PRN

## 2021-12-03 ENCOUNTER — Ambulatory Visit
Admission: RE | Admit: 2021-12-03 | Discharge: 2021-12-03 | Disposition: A | Discharge status: Home / Self Care | Payer: 59 | Source: Ambulatory Visit | Attending: Radiation Oncology | Admitting: Radiation Oncology

## 2021-12-03 ENCOUNTER — Ambulatory Visit: Payer: 59

## 2021-12-03 ENCOUNTER — Inpatient Hospital Stay: Payer: 59

## 2021-12-03 DIAGNOSIS — Z510 Encounter for antineoplastic radiation therapy: Secondary | ICD-10-CM | POA: Insufficient documentation

## 2021-12-03 DIAGNOSIS — R5381 Other malaise: Secondary | ICD-10-CM | POA: Diagnosis not present

## 2021-12-03 DIAGNOSIS — C50812 Malignant neoplasm of overlapping sites of left female breast: Secondary | ICD-10-CM | POA: Diagnosis not present

## 2021-12-03 DIAGNOSIS — R5383 Other fatigue: Secondary | ICD-10-CM | POA: Diagnosis not present

## 2021-12-03 DIAGNOSIS — C50412 Malignant neoplasm of upper-outer quadrant of left female breast: Secondary | ICD-10-CM | POA: Diagnosis present

## 2021-12-03 DIAGNOSIS — Z5112 Encounter for antineoplastic immunotherapy: Secondary | ICD-10-CM | POA: Diagnosis not present

## 2021-12-03 DIAGNOSIS — C773 Secondary and unspecified malignant neoplasm of axilla and upper limb lymph nodes: Secondary | ICD-10-CM | POA: Diagnosis not present

## 2021-12-03 DIAGNOSIS — Z171 Estrogen receptor negative status [ER-]: Secondary | ICD-10-CM | POA: Diagnosis not present

## 2021-12-04 ENCOUNTER — Other Ambulatory Visit: Payer: 59

## 2021-12-04 ENCOUNTER — Inpatient Hospital Stay: Payer: 59 | Attending: Oncology

## 2021-12-04 ENCOUNTER — Ambulatory Visit
Admission: RE | Admit: 2021-12-04 | Discharge: 2021-12-04 | Disposition: A | Discharge status: Home / Self Care | Payer: 59 | Source: Ambulatory Visit | Attending: Radiation Oncology | Admitting: Radiation Oncology

## 2021-12-04 ENCOUNTER — Ambulatory Visit: Payer: 59

## 2021-12-04 DIAGNOSIS — Z510 Encounter for antineoplastic radiation therapy: Secondary | ICD-10-CM | POA: Diagnosis not present

## 2021-12-04 DIAGNOSIS — C50412 Malignant neoplasm of upper-outer quadrant of left female breast: Secondary | ICD-10-CM | POA: Insufficient documentation

## 2021-12-04 DIAGNOSIS — R5381 Other malaise: Secondary | ICD-10-CM | POA: Insufficient documentation

## 2021-12-04 DIAGNOSIS — C50812 Malignant neoplasm of overlapping sites of left female breast: Secondary | ICD-10-CM | POA: Insufficient documentation

## 2021-12-04 DIAGNOSIS — R5383 Other fatigue: Secondary | ICD-10-CM | POA: Insufficient documentation

## 2021-12-04 DIAGNOSIS — C773 Secondary and unspecified malignant neoplasm of axilla and upper limb lymph nodes: Secondary | ICD-10-CM | POA: Insufficient documentation

## 2021-12-04 DIAGNOSIS — Z171 Estrogen receptor negative status [ER-]: Secondary | ICD-10-CM | POA: Insufficient documentation

## 2021-12-04 DIAGNOSIS — Z5112 Encounter for antineoplastic immunotherapy: Secondary | ICD-10-CM | POA: Insufficient documentation

## 2021-12-05 ENCOUNTER — Other Ambulatory Visit: Payer: Self-pay | Admitting: *Deleted

## 2021-12-05 ENCOUNTER — Ambulatory Visit
Admission: RE | Admit: 2021-12-05 | Discharge: 2021-12-05 | Disposition: A | Discharge status: Home / Self Care | Payer: 59 | Source: Ambulatory Visit | Attending: Radiation Oncology | Admitting: Radiation Oncology

## 2021-12-05 ENCOUNTER — Ambulatory Visit: Payer: 59

## 2021-12-05 ENCOUNTER — Inpatient Hospital Stay: Payer: 59

## 2021-12-05 DIAGNOSIS — Z510 Encounter for antineoplastic radiation therapy: Secondary | ICD-10-CM | POA: Diagnosis not present

## 2021-12-05 DIAGNOSIS — C50412 Malignant neoplasm of upper-outer quadrant of left female breast: Secondary | ICD-10-CM

## 2021-12-05 MED ORDER — LIDOCAINE-PRILOCAINE 2.5-2.5 % EX CREA: 1.0000 "application " | TOPICAL_CREAM | Freq: Every day | CUTANEOUS | 3 refills | Status: DC | PRN

## 2021-12-08 ENCOUNTER — Inpatient Hospital Stay: Payer: 59

## 2021-12-08 ENCOUNTER — Ambulatory Visit: Payer: 59

## 2021-12-08 ENCOUNTER — Encounter: Payer: Self-pay | Admitting: *Deleted

## 2021-12-08 ENCOUNTER — Other Ambulatory Visit: Payer: Self-pay

## 2021-12-08 ENCOUNTER — Ambulatory Visit
Admission: RE | Admit: 2021-12-08 | Discharge: 2021-12-08 | Disposition: A | Discharge status: Home / Self Care | Payer: 59 | Source: Ambulatory Visit | Attending: Radiation Oncology | Admitting: Radiation Oncology

## 2021-12-08 DIAGNOSIS — C50412 Malignant neoplasm of upper-outer quadrant of left female breast: Secondary | ICD-10-CM

## 2021-12-08 DIAGNOSIS — Z510 Encounter for antineoplastic radiation therapy: Secondary | ICD-10-CM | POA: Diagnosis not present

## 2021-12-08 DIAGNOSIS — Z171 Estrogen receptor negative status [ER-]: Secondary | ICD-10-CM

## 2021-12-08 LAB — CBC
HCT: 38.7 % (ref 36.0–46.0)
Hemoglobin: 12.7 g/dL (ref 12.0–15.0)
MCH: 29.3 pg (ref 26.0–34.0)
MCHC: 32.8 g/dL (ref 30.0–36.0)
MCV: 89.2 fL (ref 80.0–100.0)
Platelets: 144 10*3/uL — ABNORMAL LOW (ref 150–400)
RBC: 4.34 MIL/uL (ref 3.87–5.11)
RDW: 24.5 % — ABNORMAL HIGH (ref 11.5–15.5)
WBC: 12 10*3/uL — ABNORMAL HIGH (ref 4.0–10.5)
nRBC: 0 % (ref 0.0–0.2)

## 2021-12-09 ENCOUNTER — Ambulatory Visit: Payer: 59

## 2021-12-09 ENCOUNTER — Inpatient Hospital Stay: Payer: 59

## 2021-12-09 ENCOUNTER — Ambulatory Visit
Admission: RE | Admit: 2021-12-09 | Discharge: 2021-12-09 | Disposition: A | Discharge status: Home / Self Care | Payer: 59 | Source: Ambulatory Visit | Attending: Radiation Oncology | Admitting: Radiation Oncology

## 2021-12-09 ENCOUNTER — Other Ambulatory Visit: Payer: Self-pay | Admitting: *Deleted

## 2021-12-09 ENCOUNTER — Other Ambulatory Visit: Payer: Self-pay | Admitting: Oncology

## 2021-12-09 ENCOUNTER — Encounter: Payer: Self-pay | Admitting: *Deleted

## 2021-12-09 ENCOUNTER — Ambulatory Visit: Payer: 59 | Admitting: Plastic Surgery

## 2021-12-09 DIAGNOSIS — Z171 Estrogen receptor negative status [ER-]: Secondary | ICD-10-CM

## 2021-12-09 DIAGNOSIS — C50412 Malignant neoplasm of upper-outer quadrant of left female breast: Secondary | ICD-10-CM

## 2021-12-09 DIAGNOSIS — Z510 Encounter for antineoplastic radiation therapy: Secondary | ICD-10-CM | POA: Diagnosis not present

## 2021-12-09 LAB — COMPREHENSIVE METABOLIC PANEL
ALT: 785 U/L — ABNORMAL HIGH (ref 0–44)
AST: 302 U/L — ABNORMAL HIGH (ref 15–41)
Albumin: 3.3 g/dL — ABNORMAL LOW (ref 3.5–5.0)
Alkaline Phosphatase: 505 U/L — ABNORMAL HIGH (ref 38–126)
Anion gap: 6 (ref 5–15)
BUN: 10 mg/dL (ref 6–20)
CO2: 26 mmol/L (ref 22–32)
Calcium: 8.4 mg/dL — ABNORMAL LOW (ref 8.9–10.3)
Chloride: 103 mmol/L (ref 98–111)
Creatinine, Ser: 0.48 mg/dL (ref 0.44–1.00)
GFR, Estimated: >60 mL/min (ref >60)
Glucose, Bld: 96 mg/dL (ref 70–99)
Potassium: 3.7 mmol/L (ref 3.5–5.1)
Sodium: 135 mmol/L (ref 135–145)
Total Bilirubin: 4.4 mg/dL — ABNORMAL HIGH (ref 0.3–1.2)
Total Protein: 5.8 g/dL — ABNORMAL LOW (ref 6.5–8.1)

## 2021-12-09 LAB — PROTIME-INR
INR: 1.1 (ref 0.8–1.2)
Prothrombin Time: 13.9 seconds (ref 11.4–15.2)

## 2021-12-09 LAB — GAMMA GT: GGT: 764 U/L — ABNORMAL HIGH (ref 7–50)

## 2021-12-09 MED ORDER — PREDNISONE 50 MG PO TABS: ORAL_TABLET | ORAL | 0 refills | Status: DC

## 2021-12-09 MED ORDER — CLONAZEPAM 0.5 MG PO TABS
0.5000 mg | ORAL_TABLET | Freq: Every day | ORAL | 0 refills | Status: DC | PRN
Start: 2021-12-09 — End: 2022-01-05

## 2021-12-09 MED ORDER — CITALOPRAM HYDROBROMIDE 20 MG PO TABS: 20.0000 mg | ORAL_TABLET | Freq: Every day | ORAL | 0 refills | Status: DC

## 2021-12-10 ENCOUNTER — Ambulatory Visit: Payer: 59

## 2021-12-10 ENCOUNTER — Inpatient Hospital Stay: Payer: 59

## 2021-12-10 ENCOUNTER — Ambulatory Visit
Admission: RE | Admit: 2021-12-10 | Discharge: 2021-12-10 | Disposition: A | Discharge status: Home / Self Care | Payer: 59 | Source: Ambulatory Visit | Attending: Radiation Oncology | Admitting: Radiation Oncology

## 2021-12-10 DIAGNOSIS — Z510 Encounter for antineoplastic radiation therapy: Secondary | ICD-10-CM | POA: Diagnosis not present

## 2021-12-11 ENCOUNTER — Inpatient Hospital Stay: Payer: 59

## 2021-12-11 ENCOUNTER — Ambulatory Visit
Admission: RE | Admit: 2021-12-11 | Discharge: 2021-12-11 | Disposition: A | Discharge status: Home / Self Care | Payer: 59 | Source: Ambulatory Visit | Attending: Radiation Oncology | Admitting: Radiation Oncology

## 2021-12-11 DIAGNOSIS — Z510 Encounter for antineoplastic radiation therapy: Secondary | ICD-10-CM | POA: Diagnosis not present

## 2021-12-12 ENCOUNTER — Ambulatory Visit
Admission: RE | Admit: 2021-12-12 | Discharge: 2021-12-12 | Disposition: A | Discharge status: Home / Self Care | Payer: 59 | Source: Ambulatory Visit | Attending: Radiation Oncology | Admitting: Radiation Oncology

## 2021-12-12 ENCOUNTER — Other Ambulatory Visit: Payer: Self-pay

## 2021-12-12 ENCOUNTER — Inpatient Hospital Stay: Payer: 59

## 2021-12-12 DIAGNOSIS — Z510 Encounter for antineoplastic radiation therapy: Secondary | ICD-10-CM | POA: Diagnosis not present

## 2021-12-15 ENCOUNTER — Inpatient Hospital Stay: Payer: 59

## 2021-12-15 ENCOUNTER — Other Ambulatory Visit: Payer: Self-pay

## 2021-12-15 ENCOUNTER — Ambulatory Visit
Admission: RE | Admit: 2021-12-15 | Discharge: 2021-12-15 | Disposition: A | Discharge status: Home / Self Care | Payer: 59 | Source: Ambulatory Visit | Attending: Radiation Oncology | Admitting: Radiation Oncology

## 2021-12-15 DIAGNOSIS — Z510 Encounter for antineoplastic radiation therapy: Secondary | ICD-10-CM | POA: Diagnosis not present

## 2021-12-15 DIAGNOSIS — C50412 Malignant neoplasm of upper-outer quadrant of left female breast: Secondary | ICD-10-CM

## 2021-12-15 DIAGNOSIS — R7989 Other specified abnormal findings of blood chemistry: Secondary | ICD-10-CM

## 2021-12-15 LAB — COMPREHENSIVE METABOLIC PANEL
ALT: 612 U/L — ABNORMAL HIGH (ref 0–44)
AST: 307 U/L — ABNORMAL HIGH (ref 15–41)
Albumin: 3.5 g/dL (ref 3.5–5.0)
Alkaline Phosphatase: 501 U/L — ABNORMAL HIGH (ref 38–126)
Anion gap: 6 (ref 5–15)
BUN: 15 mg/dL (ref 6–20)
CO2: 24 mmol/L (ref 22–32)
Calcium: 8 mg/dL — ABNORMAL LOW (ref 8.9–10.3)
Chloride: 102 mmol/L (ref 98–111)
Creatinine, Ser: 0.6 mg/dL (ref 0.44–1.00)
GFR, Estimated: >60 mL/min (ref >60)
Glucose, Bld: 125 mg/dL — ABNORMAL HIGH (ref 70–99)
Potassium: 3.5 mmol/L (ref 3.5–5.1)
Sodium: 132 mmol/L — ABNORMAL LOW (ref 135–145)
Total Bilirubin: 6.1 mg/dL — ABNORMAL HIGH (ref 0.3–1.2)
Total Protein: 6.3 g/dL — ABNORMAL LOW (ref 6.5–8.1)

## 2021-12-17 ENCOUNTER — Other Ambulatory Visit: Payer: Self-pay | Admitting: Oncology

## 2021-12-17 ENCOUNTER — Other Ambulatory Visit: Payer: Self-pay | Admitting: *Deleted

## 2021-12-17 DIAGNOSIS — C50412 Malignant neoplasm of upper-outer quadrant of left female breast: Secondary | ICD-10-CM

## 2021-12-17 DIAGNOSIS — R7401 Elevation of levels of liver transaminase levels: Secondary | ICD-10-CM

## 2021-12-17 DIAGNOSIS — K716 Toxic liver disease with hepatitis, not elsewhere classified: Secondary | ICD-10-CM

## 2021-12-17 DIAGNOSIS — R7989 Other specified abnormal findings of blood chemistry: Secondary | ICD-10-CM

## 2021-12-22 ENCOUNTER — Encounter: Payer: Self-pay | Admitting: Oncology

## 2021-12-22 ENCOUNTER — Other Ambulatory Visit: Payer: Self-pay | Admitting: *Deleted

## 2021-12-22 ENCOUNTER — Encounter: Payer: Self-pay | Admitting: Emergency Medicine

## 2021-12-22 ENCOUNTER — Other Ambulatory Visit: Payer: Self-pay

## 2021-12-22 ENCOUNTER — Telehealth: Payer: Self-pay | Admitting: *Deleted

## 2021-12-22 ENCOUNTER — Inpatient Hospital Stay: Payer: 59

## 2021-12-22 ENCOUNTER — Inpatient Hospital Stay (HOSPITAL_BASED_OUTPATIENT_CLINIC_OR_DEPARTMENT_OTHER): Payer: 59 | Admitting: Oncology

## 2021-12-22 VITALS — BP 111/53 | HR 75 | Temp 98.9°F | Resp 20 | Wt 188.1 lb

## 2021-12-22 DIAGNOSIS — R531 Weakness: Secondary | ICD-10-CM

## 2021-12-22 DIAGNOSIS — R7401 Elevation of levels of liver transaminase levels: Secondary | ICD-10-CM

## 2021-12-22 DIAGNOSIS — Z171 Estrogen receptor negative status [ER-]: Secondary | ICD-10-CM

## 2021-12-22 DIAGNOSIS — C50412 Malignant neoplasm of upper-outer quadrant of left female breast: Secondary | ICD-10-CM | POA: Diagnosis not present

## 2021-12-22 DIAGNOSIS — Z510 Encounter for antineoplastic radiation therapy: Secondary | ICD-10-CM | POA: Diagnosis not present

## 2021-12-22 DIAGNOSIS — K7110 Toxic liver disease with hepatic necrosis, without coma: Secondary | ICD-10-CM | POA: Diagnosis not present

## 2021-12-22 DIAGNOSIS — G893 Neoplasm related pain (acute) (chronic): Secondary | ICD-10-CM

## 2021-12-22 LAB — COMPREHENSIVE METABOLIC PANEL
ALT: 420 U/L — ABNORMAL HIGH (ref 0–44)
AST: 258 U/L — ABNORMAL HIGH (ref 15–41)
Albumin: 3.4 g/dL — ABNORMAL LOW (ref 3.5–5.0)
Alkaline Phosphatase: 401 U/L — ABNORMAL HIGH (ref 38–126)
Anion gap: 9 (ref 5–15)
BUN: 10 mg/dL (ref 6–20)
CO2: 25 mmol/L (ref 22–32)
Calcium: 8.7 mg/dL — ABNORMAL LOW (ref 8.9–10.3)
Chloride: 100 mmol/L (ref 98–111)
Creatinine, Ser: 0.46 mg/dL (ref 0.44–1.00)
GFR, Estimated: >60 mL/min (ref >60)
Glucose, Bld: 121 mg/dL — ABNORMAL HIGH (ref 70–99)
Potassium: 4 mmol/L (ref 3.5–5.1)
Sodium: 134 mmol/L — ABNORMAL LOW (ref 135–145)
Total Bilirubin: 6.8 mg/dL — ABNORMAL HIGH (ref 0.3–1.2)
Total Protein: 6.1 g/dL — ABNORMAL LOW (ref 6.5–8.1)

## 2021-12-22 LAB — CBC WITH DIFFERENTIAL/PLATELET
Abs Immature Granulocytes: 0.36 10*3/uL — ABNORMAL HIGH (ref 0.00–0.07)
Basophils Absolute: 0 10*3/uL (ref 0.0–0.1)
Basophils Relative: 0 %
Eosinophils Absolute: 0 10*3/uL (ref 0.0–0.5)
Eosinophils Relative: 0 %
HCT: 32.2 % — ABNORMAL LOW (ref 36.0–46.0)
Hemoglobin: 10.8 g/dL — ABNORMAL LOW (ref 12.0–15.0)
Immature Granulocytes: 5 %
Lymphocytes Relative: 2 %
Lymphs Abs: 0.1 10*3/uL — ABNORMAL LOW (ref 0.7–4.0)
MCH: 32.7 pg (ref 26.0–34.0)
MCHC: 33.5 g/dL (ref 30.0–36.0)
MCV: 97.6 fL (ref 80.0–100.0)
Monocytes Absolute: 0.6 10*3/uL (ref 0.1–1.0)
Monocytes Relative: 8 %
Neutro Abs: 6.9 10*3/uL (ref 1.7–7.7)
Neutrophils Relative %: 85 %
Platelets: 112 10*3/uL — ABNORMAL LOW (ref 150–400)
RBC: 3.3 MIL/uL — ABNORMAL LOW (ref 3.87–5.11)
RDW: 21.8 % — ABNORMAL HIGH (ref 11.5–15.5)
WBC: 8 10*3/uL (ref 4.0–10.5)
nRBC: 0 % (ref 0.0–0.2)

## 2021-12-22 MED ORDER — HYDROMORPHONE HCL 1 MG/ML IJ SOLN
0.5000 mg | Freq: Once | INTRAMUSCULAR | Status: AC
  Administered 2021-12-22: 0.5 mg via INTRAVENOUS
  Filled 2021-12-22: qty 1

## 2021-12-22 MED ORDER — HEPARIN SOD (PORK) LOCK FLUSH 100 UNIT/ML IV SOLN
500.0000 [IU] | Freq: Once | INTRAVENOUS | Status: AC
  Administered 2021-12-22: 500 [IU] via INTRAVENOUS
  Filled 2021-12-22: qty 5

## 2021-12-22 MED ORDER — OXYCODONE HCL 5 MG PO TABS: 5.0000 mg | ORAL_TABLET | Freq: Two times a day (BID) | ORAL | 0 refills | Status: DC

## 2021-12-22 MED ORDER — SODIUM CHLORIDE 0.9 % IV SOLN
Freq: Once | INTRAVENOUS | Status: AC
  Filled 2021-12-22: qty 250

## 2021-12-22 NOTE — Telephone Encounter (Signed)
Pt already on the schedule to see Dr. Janese Banks today. Per MD, she will address concerns at this afternoon's appt. ?

## 2021-12-22 NOTE — Telephone Encounter (Signed)
[place] for [facility] to see

## 2021-12-22 NOTE — Telephone Encounter (Signed)
Pt was seen in [facility] and Janese Banks at her appt for Janese Banks today ?

## 2021-12-22 NOTE — Telephone Encounter (Signed)
Patient called reporting that she awoke this morning in extreme pain in her joints and legs and would like to be seen in [facility] and also needs refill of her Oxycodone. Please advise ?

## 2021-12-22 NOTE — Progress Notes (Signed)
Pt received IV Dilaudid for pain and 500 ml of NS. Reports feeling better at time of discharge. Accompanied by her husband. ?

## 2021-12-23 ENCOUNTER — Other Ambulatory Visit: Payer: Self-pay | Admitting: *Deleted

## 2021-12-25 ENCOUNTER — Encounter: Payer: Self-pay | Admitting: Internal Medicine

## 2021-12-25 ENCOUNTER — Encounter: Payer: Self-pay | Admitting: Oncology

## 2021-12-25 NOTE — Progress Notes (Signed)
? ? ? ?Hematology/Oncology Consult note ?[place]  ?Telephone:([phone]) [phone] [phone] ? ?Patient Care Team: ?Juluis Pitch, MD as PCP - General (Family Medicine) ?Sindy Guadeloupe, MD as Consulting Physician (Hematology and Oncology) ?Dillingham, Loel Lofty, DO as Consulting Physician (Plastic Surgery) ?Noreene Filbert, MD as Consulting Physician (Radiation Oncology) ?Herbert Pun, MD as Consulting Physician (General Surgery)  ? ?Name of the patient: Vickie Mathis  ?[id number]  ?1991/08/06  ? ?Date of visit: 12/25/21 ? 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 ?Chief complaint/ Reason for visit- Routine follow-up of Keytruda induced acute liver failure/autoimmune hepatitis ? ?Heme/Onc history: patient is a 31 year old female who self palpated a left breast mass and sought medical attention recently after thinking it was a possible cyst for all this while.She underwent a diagnostic bilateral mammogram and ultrasound which showed a 2.7 cm mass at the 12 o'clock position 6 cm from the nipple.  Ultrasound of the left axilla demonstrates 2 lymph nodes with mild thickened cortices of 4 mm.  There is skin thickening on the lower inner quadrant of the left breast on mammography.  Both the breast mass and the lymph node were biopsied and was positive for invasive mammary carcinoma grade 3.  Lymph node was also suspicious for extracapsular extension.    ER/PR and HER-2 negative ?  ?MRI showed irregular enhancing mass at the 12 o'clock position of the left breast measuring 2.5 x 2.3 cm and a linear component extending 2.3 cm anteriorly.  The mass in the anterior linear extension combined measure 4.3 cm.  3.9 cm in the cephalocaudal dimension.  Also an area of clumped non-mass enhancement in the posterior aspect of the lower quadrant of the left breast measuring 2.6 x 0.9 cm which looks suspicious.  2 enlarged left axillary lymph nodes and 2  mildly enlarged internal mammary lymph nodes.  4.3 cm clumped area of non-mass enhancement in the outer quadrant of the right breast.  3 right axillary lymph nodes with mild cortical thickening. ?  ?Patient underwent biopsy of the right breast non-mass enhancement and that was negative for malignancy ?  ?CT scan showed mildly enlarged right inguinal lymph nodes nonspecific.  Left upper breast mass with prominent left axillary lymph nodes.  Subcentimeter pulmonary nodules which are calcified and compatible with benign old granulomatous disease.  0.6 5.4 cm lucent lesion in the left first rib possibly a hemangioma or benign lesion. ?  ?Patient developed significant infusion reaction to carboplatin with dose 9 when her blood pressure dropped and she became tachycardic and significantly nauseous.  She went on to complete [facility] Keytruda chemotherapy as per keynote 522 regimen.  Patient underwent bilateral mastectomy with reconstruction in September 2022.  Final pathology showed complete pathological response2 sentinel lymph nodes negative for malignancy.  No malignancy noted in the right breast. ?  ?Plan was to complete 9 cycles of adjuvant Keytruda.  Patient noted to have abnormal LFTs on Keytruda.  Last dose of Keytruda given on 10/08/2021.  Subsequently her LFTs were significantly elevated and patient did not receive any further Keytruda.  She was admitted to [facility] for grade 4 hepatitis likely secondary to [facility] and was started on Solu-Medrol and is currently on prednisone taper and mycophenolate ?  ? ?Interval history-patient is still on 100 mg prednisone p.o. daily which she has been on for the last 3 weeks now.  Feels overall fatigued and has developed bilateral leg cramps.  There are times when  she weakness weak in her legs and needs to rest. ? ?ECOG PS- 2 ?Pain scale- 0 ? ? ?Review of systems- Review of Systems  ?Constitutional:  Positive for malaise/fatigue. Negative for chills, fever and weight loss.  ?HENT:   Negative for congestion, ear discharge and nosebleeds.   ?Eyes:  Negative for blurred vision.  ?Respiratory:  Negative for cough, hemoptysis, sputum production, shortness of breath and wheezing.   ?Cardiovascular:  Negative for chest pain, palpitations, orthopnea and claudication.  ?Gastrointestinal:  Negative for abdominal pain, blood in stool, constipation, diarrhea, heartburn, melena, nausea and vomiting.  ?Genitourinary:  Negative for dysuria, flank pain, frequency, hematuria and urgency.  ?Musculoskeletal:  Negative for back pain, joint pain and myalgias.  ?     Leg cramps  ?Skin:  Negative for rash.  ?Neurological:  Negative for dizziness, tingling, focal weakness, seizures, weakness and headaches.  ?Endo/Heme/Allergies:  Does not bruise/bleed easily.  ?Psychiatric/Behavioral:  Negative for depression and suicidal ideas. The patient does not have insomnia.    ? ?Allergies  ?Allergen Reactions  ? Carboplatin Shortness Of Breath, Nausea And Vomiting and Other (See Comments)  ?  Flushing- chest , face, neck , arm including hand  ? Amoxicillin   ?  Other reaction(s): "too young to remember what they do to me"  ? Sulfa Antibiotics   ?  Other reaction(s): "too young to remember what they do to me"  ? ? ? ?Past Medical History:  ?Diagnosis Date  ? Anxiety   ? Asthma   ? Breast cancer ([place]) 11/2020  ? 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 Depression   ? Family history of cancer   ? History of chemotherapy   ? Varicose veins of bilateral lower extremities with pain   ? ? ? ?Past Surgical History:  ?Procedure Laterality Date  ? APPENDECTOMY  2018  ? BILATERAL TOTAL MASTECTOMY WITH AXILLARY LYMPH NODE DISSECTION Bilateral 06/25/2021  ? Procedure: BILATERAL TOTAL MASTECTOMY WITH LEFT AXILLARY LYMPH NODE BIOPSY VS. AXILLARY NODE DISSECTION;  Surgeon: Herbert Pun, MD;  Location: [facility] ORS;  Service: General;  Laterality: Bilateral;  Dillingham, 1.5 hours ?Cintron-Diaz 2.5 hours  ? BREAST BIOPSY Left 11/26/2020  ? vision  12:00 6cmfn IMC  ? BREAST BIOPSY Left 11/26/2020  ? LN bx, hydro marker,  fragments of macrometastatic carcinoma  ? BREAST RECONSTRUCTION WITH PLACEMENT OF TISSUE EXPANDER AND FLEX HD (ACELLULAR HYDRATED DERMIS) Bilateral 06/25/2021  ? Procedure: BREAST RECONSTRUCTION WITH PLACEMENT OF TISSUE EXPANDER AND FLEX HD (ACELLULAR HYDRATED DERMIS);  Surgeon: Wallace Going, DO;  Location: [facility] ORS;  Service: Plastics;  Laterality: Bilateral;  ? PORTACATH PLACEMENT Right 12/13/2020  ? Procedure: INSERTION PORT-A-CATH;  Surgeon: Herbert Pun, MD;  Location: [facility] ORS;  Service: General;  Laterality: Right;  ? REMOVAL OF BILATERAL TISSUE EXPANDERS WITH PLACEMENT OF BILATERAL BREAST IMPLANTS Bilateral 09/15/2021  ? Procedure: REMOVAL OF BILATERAL TISSUE EXPANDERS;  Surgeon: Wallace Going, DO;  Location: [place];  Service: Plastics;  Laterality: Bilateral;  ? ? ?Social History  ? ?Socioeconomic History  ? Marital status: Married  ?  Spouse name: Not on file  ? Number of children: Not on file  ? Years of education: Not on file  ? Highest education level: Not on file  ?Occupational History  ? Not on file  ?Tobacco Use  ? Smoking status: Never  ? Smokeless tobacco: Never  ?Vaping Use  ? Vaping Use: Never used  ?Substance and Sexual Activity  ? Alcohol use: Not Currently  ?  Comment: occassinally   ? Drug use: Yes  ?  Types: Marijuana  ?  Comment: occ  ? Sexual activity: Yes  ?Other Topics Concern  ? Not on file  ?Social History Narrative  ? Not on file  ? ?Social Determinants of Health  ? ?Financial Resource Strain: Not on file  ?Food Insecurity: Not on file  ?Transportation Needs: Not on file  ?Physical Activity: Not on file  ?Stress: Not on file  ?Social Connections: Not on file  ?Intimate Partner Violence: Not on file  ? ? ?Family History  ?Problem Relation Age of Onset  ? Diabetes Father   ? Varicose Veins Father   ? Diabetes Paternal Aunt   ? Uterine cancer Paternal Aunt   ?      precancerous  ? Diabetes Paternal Grandmother   ? Uterine cancer Paternal Grandmother   ?     precancerous  ? Thyroid disease Mother   ? Thyroid disease Maternal Aunt   ? Thyroid disease Maternal Grandmother   ? [place]

## 2021-12-26 ENCOUNTER — Other Ambulatory Visit: Payer: Self-pay | Admitting: *Deleted

## 2021-12-26 ENCOUNTER — Telehealth: Payer: Self-pay | Admitting: *Deleted

## 2021-12-26 MED ORDER — PREDNISONE 50 MG PO TABS: 100.0000 mg | ORAL_TABLET | Freq: Every day | ORAL | 1 refills | Status: DC

## 2021-12-26 NOTE — Telephone Encounter (Signed)
Pt  called about the prednisone. She has been trying to get dr Merrilee Jansky office to refill and nothing has happened. I told her that I will ask Janese Banks and she is fine to order the meds. Also she needs a lab next Monday and we will have transportation at 1:15 and a port lab appt 2 pm on Monday. Pt agreeable and meds sent. ?

## 2021-12-29 ENCOUNTER — Telehealth: Payer: Self-pay | Admitting: *Deleted

## 2021-12-29 ENCOUNTER — Other Ambulatory Visit: Payer: Self-pay

## 2021-12-29 ENCOUNTER — Other Ambulatory Visit: Payer: Self-pay | Admitting: *Deleted

## 2021-12-29 ENCOUNTER — Inpatient Hospital Stay: Payer: 59

## 2021-12-29 DIAGNOSIS — Z171 Estrogen receptor negative status [ER-]: Secondary | ICD-10-CM

## 2021-12-29 DIAGNOSIS — K7110 Toxic liver disease with hepatic necrosis, without coma: Secondary | ICD-10-CM

## 2021-12-29 DIAGNOSIS — Z510 Encounter for antineoplastic radiation therapy: Secondary | ICD-10-CM | POA: Diagnosis not present

## 2021-12-29 DIAGNOSIS — R7401 Elevation of levels of liver transaminase levels: Secondary | ICD-10-CM

## 2021-12-29 DIAGNOSIS — Z95828 Presence of other vascular implants and grafts: Secondary | ICD-10-CM

## 2021-12-29 LAB — COMPREHENSIVE METABOLIC PANEL
ALT: 324 U/L — ABNORMAL HIGH (ref 0–44)
AST: 198 U/L — ABNORMAL HIGH (ref 15–41)
Albumin: 3.5 g/dL (ref 3.5–5.0)
Alkaline Phosphatase: 435 U/L — ABNORMAL HIGH (ref 38–126)
Anion gap: 7 (ref 5–15)
BUN: 14 mg/dL (ref 6–20)
CO2: 24 mmol/L (ref 22–32)
Calcium: 8.4 mg/dL — ABNORMAL LOW (ref 8.9–10.3)
Chloride: 106 mmol/L (ref 98–111)
Creatinine, Ser: 0.62 mg/dL (ref 0.44–1.00)
GFR, Estimated: >60 mL/min (ref >60)
Glucose, Bld: 105 mg/dL — ABNORMAL HIGH (ref 70–99)
Potassium: 3.9 mmol/L (ref 3.5–5.1)
Sodium: 137 mmol/L (ref 135–145)
Total Bilirubin: 6.1 mg/dL — ABNORMAL HIGH (ref 0.3–1.2)
Total Protein: 6.2 g/dL — ABNORMAL LOW (ref 6.5–8.1)

## 2021-12-29 LAB — CBC WITH DIFFERENTIAL/PLATELET
Abs Immature Granulocytes: 0.12 10*3/uL — ABNORMAL HIGH (ref 0.00–0.07)
Basophils Absolute: 0 10*3/uL (ref 0.0–0.1)
Basophils Relative: 0 %
Eosinophils Absolute: 0 10*3/uL (ref 0.0–0.5)
Eosinophils Relative: 0 %
HCT: 33.4 % — ABNORMAL LOW (ref 36.0–46.0)
Hemoglobin: 11.1 g/dL — ABNORMAL LOW (ref 12.0–15.0)
Immature Granulocytes: 2 %
Lymphocytes Relative: 2 %
Lymphs Abs: 0.1 10*3/uL — ABNORMAL LOW (ref 0.7–4.0)
MCH: 35.2 pg — ABNORMAL HIGH (ref 26.0–34.0)
MCHC: 33.2 g/dL (ref 30.0–36.0)
MCV: 106 fL — ABNORMAL HIGH (ref 80.0–100.0)
Monocytes Absolute: 0.5 10*3/uL (ref 0.1–1.0)
Monocytes Relative: 7 %
Neutro Abs: 6.1 10*3/uL (ref 1.7–7.7)
Neutrophils Relative %: 89 %
Platelets: 105 10*3/uL — ABNORMAL LOW (ref 150–400)
RBC: 3.15 MIL/uL — ABNORMAL LOW (ref 3.87–5.11)
RDW: 17.9 % — ABNORMAL HIGH (ref 11.5–15.5)
WBC: 6.8 10*3/uL (ref 4.0–10.5)
nRBC: 0 % (ref 0.0–0.2)

## 2021-12-29 MED ORDER — PREDNISONE 20 MG PO TABS: 80.0000 mg | ORAL_TABLET | Freq: Every day | ORAL | 0 refills | Status: DC

## 2021-12-29 MED ORDER — CHOLESTYRAMINE LIGHT 4 G PO PACK: 4.0000 g | PACK | Freq: Two times a day (BID) | ORAL | 0 refills | Status: DC

## 2021-12-29 MED ORDER — HEPARIN SOD (PORK) LOCK FLUSH 100 UNIT/ML IV SOLN
500.0000 [IU] | Freq: Once | INTRAVENOUS | Status: AC
  Administered 2021-12-29: 500 [IU] via INTRAVENOUS
  Filled 2021-12-29: qty 5

## 2021-12-29 MED ORDER — SODIUM CHLORIDE 0.9% FLUSH
10.0000 mL | INTRAVENOUS | Status: DC | PRN
  Administered 2021-12-29: 10 mL via INTRAVENOUS
  Filled 2021-12-29: qty 10

## 2021-12-29 NOTE — Progress Notes (Signed)
orders

## 2021-12-29 NOTE — Telephone Encounter (Signed)
Called pt with her liver enzymes and they are getting better and Dr. Janese Banks spoke to Dr. Merrilee Jansky and they are ok to reduce prednisone to 80 mg. Also the pt wanted to know if she could start cholestyramine for the itching from the LFT's being elevated. Dr Janese Banks was ok with it . I sent the prednisone for the lower dosage and the cholestyramine 4 mg bid for 1 week to see how it does. Pt has new appt for 4/3 at 2 pm and I have sent message to get [employer] transportation for that day and pt ok with this pla ?

## 2021-12-31 ENCOUNTER — Encounter: Payer: Self-pay | Admitting: *Deleted

## 2022-01-01 ENCOUNTER — Other Ambulatory Visit: Payer: Self-pay | Admitting: Oncology

## 2022-01-05 ENCOUNTER — Inpatient Hospital Stay: Payer: 59

## 2022-01-05 ENCOUNTER — Other Ambulatory Visit: Payer: Self-pay | Admitting: Nurse Practitioner

## 2022-01-05 ENCOUNTER — Other Ambulatory Visit: Payer: Self-pay | Admitting: Oncology

## 2022-01-05 ENCOUNTER — Inpatient Hospital Stay: Payer: 59 | Attending: Oncology

## 2022-01-05 ENCOUNTER — Other Ambulatory Visit: Payer: 59

## 2022-01-05 ENCOUNTER — Encounter: Payer: Self-pay | Admitting: *Deleted

## 2022-01-05 DIAGNOSIS — Z7952 Long term (current) use of systemic steroids: Secondary | ICD-10-CM | POA: Diagnosis not present

## 2022-01-05 DIAGNOSIS — R7989 Other specified abnormal findings of blood chemistry: Secondary | ICD-10-CM | POA: Diagnosis not present

## 2022-01-05 DIAGNOSIS — G620 Drug-induced polyneuropathy: Secondary | ICD-10-CM | POA: Diagnosis not present

## 2022-01-05 DIAGNOSIS — Z809 Family history of malignant neoplasm, unspecified: Secondary | ICD-10-CM | POA: Diagnosis not present

## 2022-01-05 DIAGNOSIS — T451X5A Adverse effect of antineoplastic and immunosuppressive drugs, initial encounter: Secondary | ICD-10-CM | POA: Insufficient documentation

## 2022-01-05 DIAGNOSIS — C50412 Malignant neoplasm of upper-outer quadrant of left female breast: Secondary | ICD-10-CM | POA: Insufficient documentation

## 2022-01-05 DIAGNOSIS — K754 Autoimmune hepatitis: Secondary | ICD-10-CM | POA: Insufficient documentation

## 2022-01-05 DIAGNOSIS — Z9221 Personal history of antineoplastic chemotherapy: Secondary | ICD-10-CM | POA: Diagnosis not present

## 2022-01-05 DIAGNOSIS — Z9013 Acquired absence of bilateral breasts and nipples: Secondary | ICD-10-CM | POA: Diagnosis not present

## 2022-01-05 DIAGNOSIS — Z923 Personal history of irradiation: Secondary | ICD-10-CM | POA: Insufficient documentation

## 2022-01-05 DIAGNOSIS — Z171 Estrogen receptor negative status [ER-]: Secondary | ICD-10-CM | POA: Insufficient documentation

## 2022-01-05 DIAGNOSIS — K7110 Toxic liver disease with hepatic necrosis, without coma: Secondary | ICD-10-CM

## 2022-01-05 DIAGNOSIS — R7401 Elevation of levels of liver transaminase levels: Secondary | ICD-10-CM

## 2022-01-05 LAB — CBC WITH DIFFERENTIAL/PLATELET
Abs Immature Granulocytes: 0.06 10*3/uL (ref 0.00–0.07)
Basophils Absolute: 0 10*3/uL (ref 0.0–0.1)
Basophils Relative: 0 %
Eosinophils Absolute: 0 10*3/uL (ref 0.0–0.5)
Eosinophils Relative: 0 %
HCT: 35.2 % — ABNORMAL LOW (ref 36.0–46.0)
Hemoglobin: 11.7 g/dL — ABNORMAL LOW (ref 12.0–15.0)
Immature Granulocytes: 1 %
Lymphocytes Relative: 1 %
Lymphs Abs: 0.1 10*3/uL — ABNORMAL LOW (ref 0.7–4.0)
MCH: 35.1 pg — ABNORMAL HIGH (ref 26.0–34.0)
MCHC: 33.2 g/dL (ref 30.0–36.0)
MCV: 105.7 fL — ABNORMAL HIGH (ref 80.0–100.0)
Monocytes Absolute: 0.2 10*3/uL (ref 0.1–1.0)
Monocytes Relative: 3 %
Neutro Abs: 6.2 10*3/uL (ref 1.7–7.7)
Neutrophils Relative %: 95 %
Platelets: 117 10*3/uL — ABNORMAL LOW (ref 150–400)
RBC: 3.33 MIL/uL — ABNORMAL LOW (ref 3.87–5.11)
RDW: 15.2 % (ref 11.5–15.5)
WBC: 6.6 10*3/uL (ref 4.0–10.5)
nRBC: 0 % (ref 0.0–0.2)

## 2022-01-05 LAB — COMPREHENSIVE METABOLIC PANEL
ALT: 195 U/L — ABNORMAL HIGH (ref 0–44)
AST: 100 U/L — ABNORMAL HIGH (ref 15–41)
Albumin: 3.5 g/dL (ref 3.5–5.0)
Alkaline Phosphatase: 320 U/L — ABNORMAL HIGH (ref 38–126)
Anion gap: 5 (ref 5–15)
BUN: 13 mg/dL (ref 6–20)
CO2: 25 mmol/L (ref 22–32)
Calcium: 8.2 mg/dL — ABNORMAL LOW (ref 8.9–10.3)
Chloride: 104 mmol/L (ref 98–111)
Creatinine, Ser: 0.64 mg/dL (ref 0.44–1.00)
GFR, Estimated: >60 mL/min (ref >60)
Glucose, Bld: 112 mg/dL — ABNORMAL HIGH (ref 70–99)
Potassium: 3.6 mmol/L (ref 3.5–5.1)
Sodium: 134 mmol/L — ABNORMAL LOW (ref 135–145)
Total Bilirubin: 2.8 mg/dL — ABNORMAL HIGH (ref 0.3–1.2)
Total Protein: 5.6 g/dL — ABNORMAL LOW (ref 6.5–8.1)

## 2022-01-05 MED ORDER — HEPARIN SOD (PORK) LOCK FLUSH 100 UNIT/ML IV SOLN
500.0000 [IU] | Freq: Once | INTRAVENOUS | Status: AC
  Administered 2022-01-05: 500 [IU] via INTRAVENOUS
  Filled 2022-01-05: qty 5

## 2022-01-05 MED ORDER — SODIUM CHLORIDE 0.9% FLUSH
10.0000 mL | Freq: Once | INTRAVENOUS | Status: AC
  Administered 2022-01-05: 10 mL via INTRAVENOUS
  Filled 2022-01-05: qty 10

## 2022-01-06 ENCOUNTER — Encounter: Payer: Self-pay | Admitting: *Deleted

## 2022-01-12 ENCOUNTER — Inpatient Hospital Stay: Payer: 59

## 2022-01-12 DIAGNOSIS — Z95828 Presence of other vascular implants and grafts: Secondary | ICD-10-CM

## 2022-01-12 DIAGNOSIS — Z171 Estrogen receptor negative status [ER-]: Secondary | ICD-10-CM

## 2022-01-12 DIAGNOSIS — C50412 Malignant neoplasm of upper-outer quadrant of left female breast: Secondary | ICD-10-CM | POA: Diagnosis not present

## 2022-01-12 DIAGNOSIS — K7110 Toxic liver disease with hepatic necrosis, without coma: Secondary | ICD-10-CM

## 2022-01-12 LAB — COMPREHENSIVE METABOLIC PANEL
ALT: 191 U/L — ABNORMAL HIGH (ref 0–44)
AST: 101 U/L — ABNORMAL HIGH (ref 15–41)
Albumin: 3.6 g/dL (ref 3.5–5.0)
Alkaline Phosphatase: 362 U/L — ABNORMAL HIGH (ref 38–126)
Anion gap: 5 (ref 5–15)
BUN: 12 mg/dL (ref 6–20)
CO2: 28 mmol/L (ref 22–32)
Calcium: 8.5 mg/dL — ABNORMAL LOW (ref 8.9–10.3)
Chloride: 103 mmol/L (ref 98–111)
Creatinine, Ser: 0.73 mg/dL (ref 0.44–1.00)
GFR, Estimated: >60 mL/min (ref >60)
Glucose, Bld: 122 mg/dL — ABNORMAL HIGH (ref 70–99)
Potassium: 4.4 mmol/L (ref 3.5–5.1)
Sodium: 136 mmol/L (ref 135–145)
Total Bilirubin: 2 mg/dL — ABNORMAL HIGH (ref 0.3–1.2)
Total Protein: 6.2 g/dL — ABNORMAL LOW (ref 6.5–8.1)

## 2022-01-12 MED ORDER — SODIUM CHLORIDE 0.9% FLUSH
10.0000 mL | INTRAVENOUS | Status: DC | PRN
  Administered 2022-01-12: 10 mL via INTRAVENOUS
  Filled 2022-01-12: qty 10

## 2022-01-12 MED ORDER — HEPARIN SOD (PORK) LOCK FLUSH 100 UNIT/ML IV SOLN
500.0000 [IU] | Freq: Once | INTRAVENOUS | Status: AC
  Administered 2022-01-12: 500 [IU] via INTRAVENOUS
  Filled 2022-01-12: qty 5

## 2022-01-18 ENCOUNTER — Other Ambulatory Visit: Payer: Self-pay | Admitting: *Deleted

## 2022-01-18 ENCOUNTER — Encounter: Payer: Self-pay | Admitting: Oncology

## 2022-01-18 ENCOUNTER — Telehealth: Payer: Self-pay | Admitting: *Deleted

## 2022-01-18 ENCOUNTER — Encounter: Payer: Self-pay | Admitting: Internal Medicine

## 2022-01-18 DIAGNOSIS — C50412 Malignant neoplasm of upper-outer quadrant of left female breast: Secondary | ICD-10-CM

## 2022-01-18 DIAGNOSIS — R7401 Elevation of levels of liver transaminase levels: Secondary | ICD-10-CM

## 2022-01-18 NOTE — Telephone Encounter (Signed)
I called the pt . And let her know that Dr. Merrilee Jansky says to reduce presnisone to 50 mg daily and he was sending in cellcept and he was increasing it from 500 mg daily to 750 mg daily. The pt. Needs to come in next Monday and see what labs are and she did not sign form for [employer] form that she gave me to fill out. Also the pt is suppose to go to back to work by end of April or she loses her job and wants a visit with Janese Banks next week and she has been scheduled for 4/17 and pt knows to look at her my chart because jennifer the scheduler will work it out for her . Pt is ok with plan ? ? ?I spoke to pt on 4/12 but forgot to enter this note ?

## 2022-01-18 NOTE — Progress Notes (Signed)
Lab ordered.

## 2022-01-19 ENCOUNTER — Other Ambulatory Visit: Payer: Self-pay | Admitting: *Deleted

## 2022-01-19 ENCOUNTER — Ambulatory Visit
Admission: RE | Admit: 2022-01-19 | Discharge: 2022-01-19 | Disposition: A | Discharge status: Home / Self Care | Payer: 59 | Source: Ambulatory Visit | Attending: Radiation Oncology | Admitting: Radiation Oncology

## 2022-01-19 ENCOUNTER — Inpatient Hospital Stay (HOSPITAL_BASED_OUTPATIENT_CLINIC_OR_DEPARTMENT_OTHER): Payer: 59 | Admitting: Oncology

## 2022-01-19 ENCOUNTER — Inpatient Hospital Stay: Payer: 59

## 2022-01-19 ENCOUNTER — Encounter: Payer: Self-pay | Admitting: Oncology

## 2022-01-19 VITALS — BP 105/54 | HR 77 | Temp 97.4°F | Resp 18 | Wt 193.6 lb

## 2022-01-19 DIAGNOSIS — K7110 Toxic liver disease with hepatic necrosis, without coma: Secondary | ICD-10-CM

## 2022-01-19 DIAGNOSIS — Z9013 Acquired absence of bilateral breasts and nipples: Secondary | ICD-10-CM | POA: Insufficient documentation

## 2022-01-19 DIAGNOSIS — Z171 Estrogen receptor negative status [ER-]: Secondary | ICD-10-CM | POA: Insufficient documentation

## 2022-01-19 DIAGNOSIS — K754 Autoimmune hepatitis: Secondary | ICD-10-CM | POA: Insufficient documentation

## 2022-01-19 DIAGNOSIS — K7200 Acute and subacute hepatic failure without coma: Secondary | ICD-10-CM | POA: Insufficient documentation

## 2022-01-19 DIAGNOSIS — C50412 Malignant neoplasm of upper-outer quadrant of left female breast: Secondary | ICD-10-CM

## 2022-01-19 DIAGNOSIS — R7401 Elevation of levels of liver transaminase levels: Secondary | ICD-10-CM

## 2022-01-19 DIAGNOSIS — K716 Toxic liver disease with hepatitis, not elsewhere classified: Secondary | ICD-10-CM

## 2022-01-19 DIAGNOSIS — T50905A Adverse effect of unspecified drugs, medicaments and biological substances, initial encounter: Secondary | ICD-10-CM | POA: Diagnosis not present

## 2022-01-19 DIAGNOSIS — Z923 Personal history of irradiation: Secondary | ICD-10-CM | POA: Insufficient documentation

## 2022-01-19 DIAGNOSIS — Z95828 Presence of other vascular implants and grafts: Secondary | ICD-10-CM

## 2022-01-19 LAB — COMPREHENSIVE METABOLIC PANEL
ALT: 218 U/L — ABNORMAL HIGH (ref 0–44)
AST: 143 U/L — ABNORMAL HIGH (ref 15–41)
Albumin: 3.4 g/dL — ABNORMAL LOW (ref 3.5–5.0)
Alkaline Phosphatase: 298 U/L — ABNORMAL HIGH (ref 38–126)
Anion gap: 4 — ABNORMAL LOW (ref 5–15)
BUN: 14 mg/dL (ref 6–20)
CO2: 25 mmol/L (ref 22–32)
Calcium: 8.6 mg/dL — ABNORMAL LOW (ref 8.9–10.3)
Chloride: 109 mmol/L (ref 98–111)
Creatinine, Ser: 0.75 mg/dL (ref 0.44–1.00)
GFR, Estimated: >60 mL/min (ref >60)
Glucose, Bld: 116 mg/dL — ABNORMAL HIGH (ref 70–99)
Potassium: 3.6 mmol/L (ref 3.5–5.1)
Sodium: 138 mmol/L (ref 135–145)
Total Bilirubin: 1.3 mg/dL — ABNORMAL HIGH (ref 0.3–1.2)
Total Protein: 5.9 g/dL — ABNORMAL LOW (ref 6.5–8.1)

## 2022-01-19 LAB — CBC WITH DIFFERENTIAL/PLATELET
Abs Immature Granulocytes: 0.05 10*3/uL (ref 0.00–0.07)
Basophils Absolute: 0 10*3/uL (ref 0.0–0.1)
Basophils Relative: 0 %
Eosinophils Absolute: 0 10*3/uL (ref 0.0–0.5)
Eosinophils Relative: 0 %
HCT: 36.7 % (ref 36.0–46.0)
Hemoglobin: 12.2 g/dL (ref 12.0–15.0)
Immature Granulocytes: 1 %
Lymphocytes Relative: 2 %
Lymphs Abs: 0.2 10*3/uL — ABNORMAL LOW (ref 0.7–4.0)
MCH: 34.4 pg — ABNORMAL HIGH (ref 26.0–34.0)
MCHC: 33.2 g/dL (ref 30.0–36.0)
MCV: 103.4 fL — ABNORMAL HIGH (ref 80.0–100.0)
Monocytes Absolute: 0.5 10*3/uL (ref 0.1–1.0)
Monocytes Relative: 7 %
Neutro Abs: 6.7 10*3/uL (ref 1.7–7.7)
Neutrophils Relative %: 90 %
Platelets: 113 10*3/uL — ABNORMAL LOW (ref 150–400)
RBC: 3.55 MIL/uL — ABNORMAL LOW (ref 3.87–5.11)
RDW: 14.2 % (ref 11.5–15.5)
WBC: 7.4 10*3/uL (ref 4.0–10.5)
nRBC: 0 % (ref 0.0–0.2)

## 2022-01-19 MED ORDER — DAPSONE 100 MG PO TABS: 100.0000 mg | ORAL_TABLET | Freq: Every day | ORAL | 0 refills | Status: DC

## 2022-01-19 MED ORDER — SODIUM CHLORIDE 0.9% FLUSH
10.0000 mL | Freq: Once | INTRAVENOUS | Status: AC
  Administered 2022-01-19: 10 mL via INTRAVENOUS
  Filled 2022-01-19: qty 10

## 2022-01-19 MED ORDER — HEPARIN SOD (PORK) LOCK FLUSH 100 UNIT/ML IV SOLN
500.0000 [IU] | Freq: Once | INTRAVENOUS | Status: AC
  Administered 2022-01-19: 500 [IU] via INTRAVENOUS
  Filled 2022-01-19: qty 5

## 2022-01-19 NOTE — Progress Notes (Signed)
Pt wants to talk about returing to work: "hands are dropping often and it feels different" trying to go on more walks and seems to be going well; going every other day around the block. Will lose her insurance by the end of the month if she does not return to work. Pt feels like she is experiencing "chemo brain".  ?

## 2022-01-19 NOTE — Progress Notes (Signed)
? ? ? ?Hematology/Oncology Consult note ?[place]  ?Telephone:([phone]) [phone] [phone] ? ?Patient Care Team: ?Juluis Pitch, MD as PCP - General (Family Medicine) ?Sindy Guadeloupe, MD as Consulting Physician (Hematology and Oncology) ?Dillingham, Loel Lofty, DO as Consulting Physician (Plastic Surgery) ?Noreene Filbert, MD as Consulting Physician (Radiation Oncology) ?Herbert Pun, MD as Consulting Physician (General Surgery)  ? ?Name of the patient: Vickie Mathis  ?[id number]  ?05-21-1991  ? ?Date of visit: 01/19/22 ? 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 ?Chief complaint/ Reason for visit-routine follow-up of Keytruda induced autoimmune hepatitis ? ?Heme/Onc history: patient is a 31 year old female who self palpated a left breast mass and sought medical attention recently after thinking it was a possible cyst for all this while.She underwent a diagnostic bilateral mammogram and ultrasound which showed a 2.7 cm mass at the 12 o'clock position 6 cm from the nipple.  Ultrasound of the left axilla demonstrates 2 lymph nodes with mild thickened cortices of 4 mm.  There is skin thickening on the lower inner quadrant of the left breast on mammography.  Both the breast mass and the lymph node were biopsied and was positive for invasive mammary carcinoma grade 3.  Lymph node was also suspicious for extracapsular extension.    ER/PR and HER-2 negative ?  ?MRI showed irregular enhancing mass at the 12 o'clock position of the left breast measuring 2.5 x 2.3 cm and a linear component extending 2.3 cm anteriorly.  The mass in the anterior linear extension combined measure 4.3 cm.  3.9 cm in the cephalocaudal dimension.  Also an area of clumped non-mass enhancement in the posterior aspect of the lower quadrant of the left breast measuring 2.6 x 0.9 cm which looks suspicious.  2 enlarged left axillary lymph nodes and 2 mildly enlarged internal  mammary lymph nodes.  4.3 cm clumped area of non-mass enhancement in the outer quadrant of the right breast.  3 right axillary lymph nodes with mild cortical thickening. ?  ?Patient underwent biopsy of the right breast non-mass enhancement and that was negative for malignancy ?  ?CT scan showed mildly enlarged right inguinal lymph nodes nonspecific.  Left upper breast mass with prominent left axillary lymph nodes.  Subcentimeter pulmonary nodules which are calcified and compatible with benign old granulomatous disease.  0.6 5.4 cm lucent lesion in the left first rib possibly a hemangioma or benign lesion. ?  ?Patient developed significant infusion reaction to carboplatin with dose 9 when her blood pressure dropped and she became tachycardic and significantly nauseous.  She went on to complete [facility] Keytruda chemotherapy as per keynote 522 regimen.  Patient underwent bilateral mastectomy with reconstruction in September 2022.  Final pathology showed complete pathological response2 sentinel lymph nodes negative for malignancy.  No malignancy noted in the right breast. ?  ?Plan was to complete 9 cycles of adjuvant Keytruda.  Patient noted to have abnormal LFTs on Keytruda.  Last dose of Keytruda given on 10/08/2021.  Subsequently her LFTs were significantly elevated and patient did not receive any further Keytruda.  She was admitted to [facility] for grade 4 hepatitis likely secondary to [facility] and was started on Solu-Medrol and is currently on prednisone taper and mycophenolate ?  ? ?Interval history-she is slowly improving but still reports ongoing fatigue.  She reports that she had mild neuropathy after stopping chemotherapy but feels like her neuropathy is now progressing to the point that she is unable to grasp onto  objects.  She feels that the symptoms have been worse after she has started steroids. ? ?ECOG PS- 1 ?Pain scale- 0 ? ? ?Review of systems- Review of Systems  ?Constitutional:  Positive for malaise/fatigue.  Negative for chills, fever and weight loss.  ?HENT:  Negative for congestion, ear discharge and nosebleeds.   ?Eyes:  Negative for blurred vision.  ?Respiratory:  Negative for cough, hemoptysis, sputum production, shortness of breath and wheezing.   ?Cardiovascular:  Negative for chest pain, palpitations, orthopnea and claudication.  ?Gastrointestinal:  Negative for abdominal pain, blood in stool, constipation, diarrhea, heartburn, melena, nausea and vomiting.  ?Genitourinary:  Negative for dysuria, flank pain, frequency, hematuria and urgency.  ?Musculoskeletal:  Negative for back pain, joint pain and myalgias.  ?Skin:  Negative for rash.  ?Neurological:  Positive for sensory change (Peripheral neuropathy). Negative for dizziness, tingling, focal weakness, seizures, weakness and headaches.  ?Endo/Heme/Allergies:  Does not bruise/bleed easily.  ?Psychiatric/Behavioral:  Negative for depression and suicidal ideas. The patient does not have insomnia.    ? ? ? ?Allergies  ?Allergen Reactions  ? Carboplatin Shortness Of Breath, Nausea And Vomiting and Other (See Comments)  ?  Flushing- chest , face, neck , arm including hand  ? Amoxicillin   ?  Other reaction(s): "too young to remember what they do to me"  ? Sulfa Antibiotics   ?  Other reaction(s): "too young to remember what they do to me"  ? ? ? ?Past Medical History:  ?Diagnosis Date  ? Anxiety   ? Asthma   ? Breast cancer ([place]) 11/2020  ? 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 Depression   ? Family history of cancer   ? History of chemotherapy   ? Varicose veins of bilateral lower extremities with pain   ? ? ? ?Past Surgical History:  ?Procedure Laterality Date  ? APPENDECTOMY  2018  ? BILATERAL TOTAL MASTECTOMY WITH AXILLARY LYMPH NODE DISSECTION Bilateral 06/25/2021  ? Procedure: BILATERAL TOTAL MASTECTOMY WITH LEFT AXILLARY LYMPH NODE BIOPSY VS. AXILLARY NODE DISSECTION;  Surgeon: Herbert Pun, MD;  Location: [facility] ORS;  Service: General;  Laterality:  Bilateral;  Dillingham, 1.5 hours ?Cintron-Diaz 2.5 hours  ? BREAST BIOPSY Left 11/26/2020  ? vision 12:00 6cmfn IMC  ? BREAST BIOPSY Left 11/26/2020  ? LN bx, hydro marker,  fragments of macrometastatic carcinoma  ? BREAST RECONSTRUCTION WITH PLACEMENT OF TISSUE EXPANDER AND FLEX HD (ACELLULAR HYDRATED DERMIS) Bilateral 06/25/2021  ? Procedure: BREAST RECONSTRUCTION WITH PLACEMENT OF TISSUE EXPANDER AND FLEX HD (ACELLULAR HYDRATED DERMIS);  Surgeon: Wallace Going, DO;  Location: [facility] ORS;  Service: Plastics;  Laterality: Bilateral;  ? PORTACATH PLACEMENT Right 12/13/2020  ? Procedure: INSERTION PORT-A-CATH;  Surgeon: Herbert Pun, MD;  Location: [facility] ORS;  Service: General;  Laterality: Right;  ? REMOVAL OF BILATERAL TISSUE EXPANDERS WITH PLACEMENT OF BILATERAL BREAST IMPLANTS Bilateral 09/15/2021  ? Procedure: REMOVAL OF BILATERAL TISSUE EXPANDERS;  Surgeon: Wallace Going, DO;  Location: [place];  Service: Plastics;  Laterality: Bilateral;  ? ? ?Social History  ? ?Socioeconomic History  ? Marital status: Married  ?  Spouse name: Not on file  ? Number of children: Not on file  ? Years of education: Not on file  ? Highest education level: Not on file  ?Occupational History  ? Not on file  ?Tobacco Use  ? Smoking status: Never  ? Smokeless tobacco: Never  ?Vaping Use  ? Vaping Use: Never used  ?Substance and Sexual Activity  ?  Alcohol use: Not Currently  ?  Comment: occassinally   ? Drug use: Yes  ?  Types: Marijuana  ?  Comment: occ  ? Sexual activity: Yes  ?Other Topics Concern  ? Not on file  ?Social History Narrative  ? Not on file  ? ?Social Determinants of Health  ? ?Financial Resource Strain: Not on file  ?Food Insecurity: Not on file  ?Transportation Needs: Not on file  ?Physical Activity: Not on file  ?Stress: Not on file  ?Social Connections: Not on file  ?Intimate Partner Violence: Not on file  ? ? ?Family History  ?Problem Relation Age of Onset  ? Diabetes Father   ?  Varicose Veins Father   ? Diabetes Paternal Aunt   ? Uterine cancer Paternal Aunt   ?     precancerous  ? Diabetes Paternal Grandmother   ? Uterine cancer Paternal Grandmother   ?     precancerous  ? Thyroid dis

## 2022-01-19 NOTE — Progress Notes (Signed)
Radiation Oncology ?Follow up Note ? ?Name: Vickie Mathis   ?Date:   01/19/2022 ?MRN:  [id number] ?DOB: 1990/10/12  ? ? ?This 31 y.o. female presents to the clinic today for 1 month follow-up status post radiation to her left chest wall for stage IIb (T2 N1 M0) grade 3 triple negative invasive mammary carcinoma cancer status post neoadjuvant chemotherapy followed by bilateral mastectomies with near complete response. ? ?REFERRING PROVIDER: Juluis Pitch, MD ? ?HPI: Patient is a 31 year old female now seen at 1 month having completed left chest wall and peripheral lymphatic radiation therapy status post neoadjuvant chemotherapy followed by bilateral mastectomies for stage IIb triple negative invasive mammary carcinoma seen today in routine follow-up she is doing well.  She is still having issues with.  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  That is being [facility] at [facility].  She is also reconsidering breast reconstruction based on the difficulty she had after her mastectomies.  She otherwise is without complaint as far as were concerned she is having no swelling in her left upper extremity no chest wall pain or tenderness or any new nodularity or masses. ? ?COMPLICATIONS OF TREATMENT: none ? ?FOLLOW UP COMPLIANCE: keeps appointments  ? ?PHYSICAL EXAM:  ?There were no vitals taken for this visit. ?Patient is status post bilateral mastectomies.  No evidence of chest wall mass or nodularity is identified.  No evidence of lymphedema is in in the bilateral upper extremities is noted.  Well-developed well-nourished patient in NAD. HEENT reveals PERLA, EOMI, discs not visualized.  Oral cavity is clear. No oral mucosal lesions are identified. Neck is clear without evidence of cervical or supraclavicular adenopathy. Lungs are clear to A&P. Cardiac examination is essentially unremarkable with regular rate and rhythm without murmur rub or thrill. Abdomen is benign with no organomegaly or masses noted. Motor  sensory and DTR levels are equal and symmetric in the upper and lower extremities. Cranial nerves II through XII are grossly intact. Proprioception is intact. No peripheral adenopathy or edema is identified. No motor or sensory levels are noted. Crude visual fields are within normal range. ? ?RADIOLOGY RESULTS: No current films for review ? ?PLAN: Present time she is doing well she is recovered well from her radiation therapy treatments.  She has had a very low side effect profile from that.  I have asked to see her back in 5 months for follow-up.  She continues to care for her hepatic toxicity at [facility] as well as continued surveillance by medical oncology and Dr. Janese Banks.  Patient knows to call with any concerns. ? ?I would like to take this opportunity to thank you for allowing me to participate in the care of your patient.. ?  ? Noreene Filbert, MD ? ?

## 2022-01-21 ENCOUNTER — Other Ambulatory Visit: Payer: Self-pay | Admitting: *Deleted

## 2022-01-21 ENCOUNTER — Encounter: Payer: Self-pay | Admitting: *Deleted

## 2022-01-21 DIAGNOSIS — Z171 Estrogen receptor negative status [ER-]: Secondary | ICD-10-CM

## 2022-01-21 DIAGNOSIS — R7401 Elevation of levels of liver transaminase levels: Secondary | ICD-10-CM

## 2022-01-26 ENCOUNTER — Inpatient Hospital Stay: Payer: 59

## 2022-01-26 DIAGNOSIS — R7401 Elevation of levels of liver transaminase levels: Secondary | ICD-10-CM

## 2022-01-26 DIAGNOSIS — Z171 Estrogen receptor negative status [ER-]: Secondary | ICD-10-CM

## 2022-01-26 DIAGNOSIS — Z95828 Presence of other vascular implants and grafts: Secondary | ICD-10-CM

## 2022-01-26 DIAGNOSIS — C50412 Malignant neoplasm of upper-outer quadrant of left female breast: Secondary | ICD-10-CM | POA: Diagnosis not present

## 2022-01-26 LAB — CBC WITH DIFFERENTIAL/PLATELET
Abs Immature Granulocytes: 0.22 10*3/uL — ABNORMAL HIGH (ref 0.00–0.07)
Basophils Absolute: 0 10*3/uL (ref 0.0–0.1)
Basophils Relative: 1 %
Eosinophils Absolute: 0 10*3/uL (ref 0.0–0.5)
Eosinophils Relative: 0 %
HCT: 38.5 % (ref 36.0–46.0)
Hemoglobin: 12.6 g/dL (ref 12.0–15.0)
Immature Granulocytes: 3 %
Lymphocytes Relative: 3 %
Lymphs Abs: 0.3 10*3/uL — ABNORMAL LOW (ref 0.7–4.0)
MCH: 33.3 pg (ref 26.0–34.0)
MCHC: 32.7 g/dL (ref 30.0–36.0)
MCV: 101.9 fL — ABNORMAL HIGH (ref 80.0–100.0)
Monocytes Absolute: 0.7 10*3/uL (ref 0.1–1.0)
Monocytes Relative: 8 %
Neutro Abs: 7.4 10*3/uL (ref 1.7–7.7)
Neutrophils Relative %: 85 %
Platelets: 136 10*3/uL — ABNORMAL LOW (ref 150–400)
RBC: 3.78 MIL/uL — ABNORMAL LOW (ref 3.87–5.11)
RDW: 15 % (ref 11.5–15.5)
WBC: 8.6 10*3/uL (ref 4.0–10.5)
nRBC: 0 % (ref 0.0–0.2)

## 2022-01-26 LAB — COMPREHENSIVE METABOLIC PANEL
ALT: 174 U/L — ABNORMAL HIGH (ref 0–44)
AST: 137 U/L — ABNORMAL HIGH (ref 15–41)
Albumin: 3.7 g/dL (ref 3.5–5.0)
Alkaline Phosphatase: 328 U/L — ABNORMAL HIGH (ref 38–126)
Anion gap: 8 (ref 5–15)
BUN: 12 mg/dL (ref 6–20)
CO2: 26 mmol/L (ref 22–32)
Calcium: 8.7 mg/dL — ABNORMAL LOW (ref 8.9–10.3)
Chloride: 106 mmol/L (ref 98–111)
Creatinine, Ser: 0.68 mg/dL (ref 0.44–1.00)
GFR, Estimated: >60 mL/min (ref >60)
Glucose, Bld: 88 mg/dL (ref 70–99)
Potassium: 3.8 mmol/L (ref 3.5–5.1)
Sodium: 140 mmol/L (ref 135–145)
Total Bilirubin: 2 mg/dL — ABNORMAL HIGH (ref 0.3–1.2)
Total Protein: 6.4 g/dL — ABNORMAL LOW (ref 6.5–8.1)

## 2022-01-26 MED ORDER — SODIUM CHLORIDE 0.9% FLUSH
10.0000 mL | INTRAVENOUS | Status: DC | PRN
  Administered 2022-01-26: 10 mL via INTRAVENOUS
  Filled 2022-01-26: qty 10

## 2022-01-26 MED ORDER — HEPARIN SOD (PORK) LOCK FLUSH 100 UNIT/ML IV SOLN
500.0000 [IU] | Freq: Once | INTRAVENOUS | Status: AC
  Administered 2022-01-26: 500 [IU] via INTRAVENOUS
  Filled 2022-01-26: qty 5

## 2022-01-27 ENCOUNTER — Other Ambulatory Visit: Payer: Self-pay | Admitting: Oncology

## 2022-01-27 MED ORDER — OXYCODONE HCL 5 MG PO TABS: 5.0000 mg | ORAL_TABLET | Freq: Two times a day (BID) | ORAL | 0 refills | Status: DC

## 2022-01-29 ENCOUNTER — Other Ambulatory Visit: Payer: Self-pay | Admitting: *Deleted

## 2022-01-29 ENCOUNTER — Encounter: Payer: Self-pay | Admitting: *Deleted

## 2022-02-01 ENCOUNTER — Other Ambulatory Visit: Payer: Self-pay | Admitting: *Deleted

## 2022-02-02 ENCOUNTER — Other Ambulatory Visit: Payer: Self-pay | Admitting: Oncology

## 2022-02-02 ENCOUNTER — Inpatient Hospital Stay: Payer: 59 | Attending: Oncology

## 2022-02-02 ENCOUNTER — Encounter: Payer: Self-pay | Admitting: *Deleted

## 2022-02-02 ENCOUNTER — Inpatient Hospital Stay: Payer: 59

## 2022-02-02 DIAGNOSIS — K754 Autoimmune hepatitis: Secondary | ICD-10-CM | POA: Diagnosis present

## 2022-02-02 DIAGNOSIS — T451X5A Adverse effect of antineoplastic and immunosuppressive drugs, initial encounter: Secondary | ICD-10-CM | POA: Insufficient documentation

## 2022-02-02 DIAGNOSIS — Z8049 Family history of malignant neoplasm of other genital organs: Secondary | ICD-10-CM | POA: Diagnosis not present

## 2022-02-02 DIAGNOSIS — R7401 Elevation of levels of liver transaminase levels: Secondary | ICD-10-CM

## 2022-02-02 DIAGNOSIS — Z95828 Presence of other vascular implants and grafts: Secondary | ICD-10-CM

## 2022-02-02 DIAGNOSIS — Z171 Estrogen receptor negative status [ER-]: Secondary | ICD-10-CM | POA: Diagnosis not present

## 2022-02-02 DIAGNOSIS — C50412 Malignant neoplasm of upper-outer quadrant of left female breast: Secondary | ICD-10-CM

## 2022-02-02 DIAGNOSIS — M6281 Muscle weakness (generalized): Secondary | ICD-10-CM | POA: Diagnosis not present

## 2022-02-02 DIAGNOSIS — C50812 Malignant neoplasm of overlapping sites of left female breast: Secondary | ICD-10-CM | POA: Diagnosis not present

## 2022-02-02 DIAGNOSIS — R7989 Other specified abnormal findings of blood chemistry: Secondary | ICD-10-CM | POA: Diagnosis not present

## 2022-02-02 DIAGNOSIS — Z9013 Acquired absence of bilateral breasts and nipples: Secondary | ICD-10-CM | POA: Diagnosis not present

## 2022-02-02 DIAGNOSIS — G620 Drug-induced polyneuropathy: Secondary | ICD-10-CM | POA: Diagnosis not present

## 2022-02-02 LAB — COMPREHENSIVE METABOLIC PANEL
ALT: 170 U/L — ABNORMAL HIGH (ref 0–44)
AST: 139 U/L — ABNORMAL HIGH (ref 15–41)
Albumin: 3.7 g/dL (ref 3.5–5.0)
Alkaline Phosphatase: 332 U/L — ABNORMAL HIGH (ref 38–126)
Anion gap: 7 (ref 5–15)
BUN: 12 mg/dL (ref 6–20)
CO2: 26 mmol/L (ref 22–32)
Calcium: 8.4 mg/dL — ABNORMAL LOW (ref 8.9–10.3)
Chloride: 102 mmol/L (ref 98–111)
Creatinine, Ser: 0.68 mg/dL (ref 0.44–1.00)
GFR, Estimated: >60 mL/min (ref >60)
Glucose, Bld: 95 mg/dL (ref 70–99)
Potassium: 3.3 mmol/L — ABNORMAL LOW (ref 3.5–5.1)
Sodium: 135 mmol/L (ref 135–145)
Total Bilirubin: 2.2 mg/dL — ABNORMAL HIGH (ref 0.3–1.2)
Total Protein: 6.3 g/dL — ABNORMAL LOW (ref 6.5–8.1)

## 2022-02-02 LAB — CBC WITH DIFFERENTIAL/PLATELET
Abs Immature Granulocytes: 0.56 10*3/uL — ABNORMAL HIGH (ref 0.00–0.07)
Basophils Absolute: 0.1 10*3/uL (ref 0.0–0.1)
Basophils Relative: 1 %
Eosinophils Absolute: 0 10*3/uL (ref 0.0–0.5)
Eosinophils Relative: 0 %
HCT: 36.6 % (ref 36.0–46.0)
Hemoglobin: 11.9 g/dL — ABNORMAL LOW (ref 12.0–15.0)
Immature Granulocytes: 5 %
Lymphocytes Relative: 3 %
Lymphs Abs: 0.3 10*3/uL — ABNORMAL LOW (ref 0.7–4.0)
MCH: 33.3 pg (ref 26.0–34.0)
MCHC: 32.5 g/dL (ref 30.0–36.0)
MCV: 102.5 fL — ABNORMAL HIGH (ref 80.0–100.0)
Monocytes Absolute: 1 10*3/uL (ref 0.1–1.0)
Monocytes Relative: 9 %
Neutro Abs: 9 10*3/uL — ABNORMAL HIGH (ref 1.7–7.7)
Neutrophils Relative %: 82 %
Platelets: 128 10*3/uL — ABNORMAL LOW (ref 150–400)
RBC: 3.57 MIL/uL — ABNORMAL LOW (ref 3.87–5.11)
RDW: 15.9 % — ABNORMAL HIGH (ref 11.5–15.5)
WBC: 10.9 10*3/uL — ABNORMAL HIGH (ref 4.0–10.5)
nRBC: 0 % (ref 0.0–0.2)

## 2022-02-02 MED ORDER — SODIUM CHLORIDE 0.9% FLUSH
10.0000 mL | Freq: Once | INTRAVENOUS | Status: AC
  Administered 2022-02-02: 10 mL via INTRAVENOUS
  Filled 2022-02-02: qty 10

## 2022-02-02 MED ORDER — HEPARIN SOD (PORK) LOCK FLUSH 100 UNIT/ML IV SOLN
500.0000 [IU] | Freq: Once | INTRAVENOUS | Status: AC
  Administered 2022-02-02: 500 [IU] via INTRAVENOUS
  Filled 2022-02-02: qty 5

## 2022-02-02 NOTE — Progress Notes (Signed)
I sent her a my chart message and she did come in to this side and told me that she celebrated her birthday with drinking small amount of alcohol and knows the numbers might not be as good as it has been doing. She says she did tell that to nurse when she was last there at [facility] office what her plan was for birthday ?

## 2022-02-03 ENCOUNTER — Other Ambulatory Visit: Payer: Self-pay | Admitting: Oncology

## 2022-02-06 ENCOUNTER — Other Ambulatory Visit: Payer: Self-pay

## 2022-02-06 ENCOUNTER — Encounter: Payer: Self-pay | Admitting: Internal Medicine

## 2022-02-06 ENCOUNTER — Inpatient Hospital Stay (HOSPITAL_BASED_OUTPATIENT_CLINIC_OR_DEPARTMENT_OTHER): Payer: 59 | Admitting: Internal Medicine

## 2022-02-06 ENCOUNTER — Inpatient Hospital Stay: Payer: 59

## 2022-02-06 VITALS — BP 128/73 | HR 94 | Temp 97.3°F | Resp 18 | Wt 197.2 lb

## 2022-02-06 DIAGNOSIS — G620 Drug-induced polyneuropathy: Secondary | ICD-10-CM

## 2022-02-06 DIAGNOSIS — C50812 Malignant neoplasm of overlapping sites of left female breast: Secondary | ICD-10-CM | POA: Diagnosis not present

## 2022-02-06 DIAGNOSIS — T451X5A Adverse effect of antineoplastic and immunosuppressive drugs, initial encounter: Secondary | ICD-10-CM

## 2022-02-06 DIAGNOSIS — R531 Weakness: Secondary | ICD-10-CM | POA: Diagnosis not present

## 2022-02-06 MED ORDER — DAPSONE 100 MG PO TABS: 100.0000 mg | ORAL_TABLET | Freq: Every day | ORAL | 0 refills | Status: DC

## 2022-02-06 MED ORDER — CLONAZEPAM 0.5 MG PO TABS
0.5000 mg | ORAL_TABLET | Freq: Three times a day (TID) | ORAL | 0 refills | Status: DC | PRN
Start: 2022-02-06 — End: 2022-03-13

## 2022-02-06 MED ORDER — METHOCARBAMOL 500 MG PO TABS
ORAL_TABLET | ORAL | 0 refills | Status: DC
Start: 2022-02-06 — End: 2022-05-05

## 2022-02-06 MED ORDER — CHOLESTYRAMINE LIGHT 4 G PO PACK: PACK | ORAL | 0 refills | Status: DC

## 2022-02-06 NOTE — Progress Notes (Signed)
? ?[facility] [address]  ?[place], [place] [address] ?[phone] ? ? ?New Patient Evaluation ? ?Date of Service: 02/06/22 ?Patient Name: Vickie Mathis ?Patient MRN: [id number] ?Patient DOB: 1991-06-26 ?Provider: Ventura Sellers, MD ? ?Identifying Statement:  ?Vickie Mathis is a 31 y.o. female with Right sided weakness - Plan: MR CERVICAL SPINE W WO CONTRAST, MR BRAIN W WO CONTRAST, CANCELED: MR BRAIN W WO CONTRAST ? ?Chemotherapy-induced neuropathy ([place]) who presents for initial consultation and evaluation regarding cancer associated neurologic deficits.   ? ?Referring Provider: ?Sindy Guadeloupe, MD ?[place],  [place] [address] ? ?Primary Cancer: ? ?Oncologic History: ?Oncology History  ?Invasive carcinoma of breast ([place])  ?12/03/2020 Initial Diagnosis  ? Invasive carcinoma of breast ([place]) ? ?  ?12/17/2020 -  Chemotherapy  ? Patient is on Treatment Plan : BREAST Pembrolizumab + Carboplatin D1,8,15+ Paclitaxel D1,8,15 q21d X 4 cycles / Pembrolizumab + AC q21d x 4 cycles  ? ?  ?  ?12/17/2020 Cancer Staging  ? Staging form: Breast, AJCC 8th Edition ?- Clinical stage from 12/17/2020: Stage IIIB (cT2, cN1, cM0, G3, ER-, PR-, HER2-) - Signed by Sindy Guadeloupe, MD on 12/19/2020 ?Histologic grading system: 3 grade system ? ?  ? Genetic Testing  ? Negative genetic testing. No pathogenic variants identified on the [facility] CancerNext-Expanded+RNA Panel. 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 The report date is 01/14/2021. ? ?The CancerNext-Expanded + RNAinsight gene panel offered by [employer] and includes sequencing and rearrangement analysis for the following 77 genes: IP, ALK, APC*, ATM*, AXIN2, BAP1, BARD1, BLM, BMPR1A, BRCA1*, BRCA2*, BRIP1*, CDC73, CDH1*,CDK4, CDKN1B, CDKN2A, CHEK2*, CTNNA1, DICER1, FANCC, FH, FLCN, GALNT12, KIF1B, LZTR1, MAX, MEN1, MET, MLH1*, MSH2*, MSH3, MSH6*, MUTYH*, NBN, NF1*, NF2, NTHL1, PALB2*, PHOX2B, PMS2*, POT1, PRKAR1A, PTCH1, PTEN*,  RAD51C*, RAD51D*,RB1, RECQL, RET, SDHA, SDHAF2, SDHB, SDHC, SDHD, SMAD4, SMARCA4, SMARCB1, SMARCE1, STK11, SUFU, TMEM127, TP53*,TSC1, TSC2, VHL and XRCC2 (sequencing and deletion/duplication); EGFR, EGLN1, HOXB13, KIT, MITF, PDGFRA, POLD1 and POLE (sequencing only); EPCAM and GREM1 (deletion/duplication only). ?  ? ? ?History of Present Illness: ?The patient's records from the referring physician were obtained and reviewed and the patient interviewed to confirm this HPI.  Vickie Mathis presents today to review neurologic symptoms.  She describes 6 weeks history of right hand weakness.  She has been dropping objects out of the hand, having difficulty tying shoes.  This has been progressing over this time period.  Also describes numbness in the right hand and arm.  Going back to March of last year, there has been tingling, pins and needles sensations affecting both hands equally.  That stabilized and even resolved modestly after taxol chemotherapy was stopped.  She also describes some foggy thinking, slow down in cognition in recent weeks.  Finally, urinary urgency has been present for several weeks, this is also a new symptom.  Her liver injury from [facility] has been improving, but this has not led to improvement in right sided motor function or cognitive complaints.  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 ? ?Medications: ?Current Outpatient Medications on File Prior to Visit  ?Medication Sig Dispense Refill  ? busPIRone (BUSPAR) 15 MG tablet Take 15 mg by mouth 2 (two) times daily.    ? cholestyramine light (PREVALITE) 4 g packet TAKE 1 PACKET (4 G TOTAL) BY MOUTH 2 TIMES DAILY. (Patient not taking: Reported on 01/19/2022) 10 packet 0  ? citalopram (CELEXA) 20 MG tablet TAKE 1  TABLET BY MOUTH EVERY DAY 90 tablet 1  ? citalopram (CELEXA) 40 MG tablet Take 1 tablet (40 mg total) by mouth daily. (Patient not taking: Reported on 01/19/2022) 90 tablet 2  ? clonazePAM (KLONOPIN) 0.5 MG tablet  TAKE 1 TABLET BY MOUTH 3 TIMES DAILY AS NEEDED. 60 tablet 0  ? clonazePAM (KLONOPIN) 0.5 MG tablet TAKE 1 TABLET BY MOUTH EVERY DAY AS NEEDED FOR ANXIETY 30 tablet 0  ? Cyanocobalamin (B-12 PO) Take 1 tablet by mouth daily.    ? dapsone 100 MG tablet Take 1 tablet (100 mg total) by mouth daily. 30 tablet 0  ? docusate sodium (COLACE) 100 MG capsule Take 100 mg by mouth daily as needed for mild constipation.    ? doxycycline (VIBRA-TABS) 100 MG tablet Take 1 tablet (100 mg total) by mouth 2 (two) times daily. (Patient not taking: Reported on 12/22/2021) 14 tablet 0  ? folic acid (FOLVITE) 1 MG tablet TAKE 2 TABLETS BY MOUTH EVERY DAY 180 tablet 0  ? gabapentin (NEURONTIN) 300 MG capsule Take 1 capsule (300 mg total) by mouth 3 (three) times daily for 14 days. (Patient not taking: Reported on 01/19/2022) 42 capsule 3  ? hydrOXYzine (VISTARIL) 25 MG capsule TAKE 1 CAPSULE (25 MG TOTAL) BY MOUTH AT BEDTIME AS NEEDED FOR UP TO 20 DOSES. (Patient not taking: Reported on 12/22/2021) 20 capsule 0  ? lidocaine-prilocaine (EMLA) cream Apply 1 application topically daily as needed (prior to port access). 30 g 3  ? methocarbamol (ROBAXIN) 500 MG tablet TAKE 1 TABLET BY MOUTH 3 TIMES DAILY FOR 14 DAYS. 42 tablet 0  ? Multiple Vitamin (MULTIVITAMIN WITH MINERALS) TABS tablet Take 1 tablet by mouth daily.    ? mycophenolate (CELLCEPT) 250 MG capsule Take 750 mg by mouth 2 (two) times daily.    ? ondansetron (ZOFRAN ODT) 4 MG disintegrating tablet Take 1 tablet (4 mg total) by mouth every 8 (eight) hours as needed for nausea or vomiting. (Patient not taking: Reported on 01/19/2022) 30 tablet 2  ? ondansetron (ZOFRAN) 8 MG tablet Take 1 tablet (8 mg total) by mouth every 8 (eight) hours as needed for refractory nausea / vomiting. Start on day 3 after chemo. (Patient not taking: Reported on 01/19/2022) 60 tablet 3  ? ondansetron (ZOFRAN-ODT) 4 MG disintegrating tablet Take 1 tablet (4 mg total) by mouth every 8 (eight) hours as needed for  nausea or vomiting. (Patient not taking: Reported on 01/19/2022) 20 tablet 0  ? oxyCODONE (OXY IR/ROXICODONE) 5 MG immediate release tablet Take 1 tablet (5 mg total) by mouth 2 (two) times daily. 60 tablet 0  ? pantoprazole (PROTONIX) 40 MG tablet Take 40 mg by mouth daily.    ? potassium chloride SA (KLOR-CON M) 20 MEQ tablet Take 1 tablet (20 mEq total) by mouth daily. (Patient not taking: Reported on 01/19/2022) 7 tablet 0  ? predniSONE (DELTASONE) 20 MG tablet Take 4 tablets (80 mg total) by mouth daily with breakfast. 48 tablet 0  ? traZODone (DESYREL) 50 MG tablet TAKE 0.5 TABLETS BY MOUTH AT BEDTIME AS NEEDED FOR SLEEP. 7 tablet 0  ? triamcinolone ointment (KENALOG) 0.5 % Apply 1 application topically 2 (two) times daily. 30 g 0  ? TURMERIC CURCUMIN PO Take 2 capsules by mouth daily.    ? ?Current Facility-Administered Medications on File Prior to Visit  ?Medication Dose Route Frequency Provider Last Rate Last Admin  ? prochlorperazine (COMPAZINE) tablet 10 mg  10 mg Oral Q6H PRN Janese Banks,  Weston Anna, MD   10 mg at 07/16/21 1407  ? ? ?Allergies:  ?Allergies  ?Allergen Reactions  ? Carboplatin Shortness Of Breath, Nausea And Vomiting and Other (See Comments)  ?  Flushing- chest , face, neck , arm including hand  ? Amoxicillin   ?  Other reaction(s): "too young to remember what they do to me"  ? Sulfa Antibiotics   ?  Other reaction(s): "too young to remember what they do to me"  ? ?Past Medical History:  ?Past Medical History:  ?Diagnosis Date  ? Anxiety   ? Asthma   ? Breast cancer ([place]) 11/2020  ? 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 Depression   ? Family history of cancer   ? History of chemotherapy   ? Varicose veins of bilateral lower extremities with pain   ? ?Past Surgical History:  ?Past Surgical History:  ?Procedure Laterality Date  ? APPENDECTOMY  2018  ? BILATERAL TOTAL MASTECTOMY WITH AXILLARY LYMPH NODE DISSECTION Bilateral 06/25/2021  ? Procedure: BILATERAL TOTAL MASTECTOMY WITH LEFT AXILLARY LYMPH NODE  BIOPSY VS. AXILLARY NODE DISSECTION;  Surgeon: Herbert Pun, MD;  Location: [facility] ORS;  Service: General;  Laterality: Bilateral;  [place], 1.5 hours ?[place] 2.5 hours  ? BREAST BIOPSY Left

## 2022-02-06 NOTE — Progress Notes (Signed)
Patient here today for initial consult regarding neuropathy, weakness in hands. Patient reports worsening tremors and feels like she has trouble holding onto things in her hands. 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  ?

## 2022-02-08 ENCOUNTER — Ambulatory Visit
Admission: RE | Admit: 2022-02-08 | Discharge: 2022-02-08 | Disposition: A | Discharge status: Home / Self Care | Payer: 59 | Source: Ambulatory Visit | Attending: Internal Medicine | Admitting: Internal Medicine

## 2022-02-08 DIAGNOSIS — R531 Weakness: Secondary | ICD-10-CM

## 2022-02-08 IMAGING — MR MR CERVICAL SPINE WO/W CM
5 of 8 series · 28 of 48 positions shown · IV contrast (gadavist)
Comparison: None Available.

CLINICAL DATA: Right-sided weakness.  Breast carcinoma.

EXAM:
MRI HEAD WITHOUT AND WITH CONTRAST
MRI CERVICAL SPINE WITHOUT AND WITH CONTRAST
TECHNIQUE: Multiplanar, multiecho pulse sequences of the brain and surrounding
structures, and cervical spine, to include the craniocervical
junction and cervicothoracic junction, were obtained without and
with intravenous contrast.
CONTRAST:  10mL GADAVIST GADOBUTROL 1 MMOL/ML IV SOLN

[Series 9: T2 · sagittal · 3.0mm · 0.62mm/px · 4 of 15 slices shown (1 of 2)]
[im 1/15]
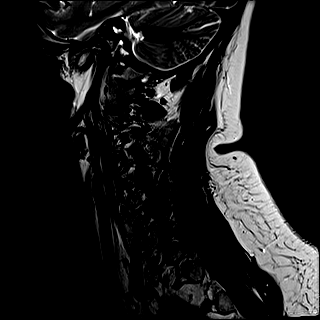
[im 5/15]
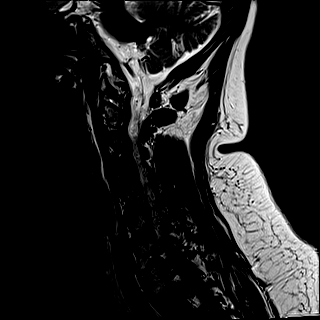
[im 10/15]
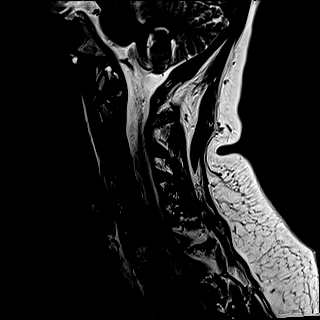
[im 15/15]
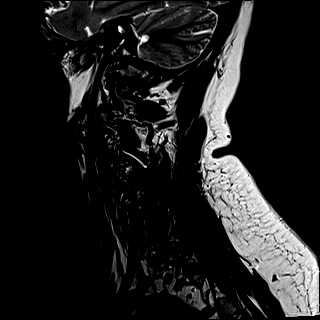

[Series 11: STIR · sagittal · 3.0mm · 0.62mm/px · 4 of 15 slices shown]
[im 1/15]
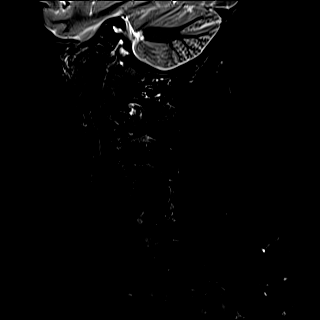
[im 5/15]
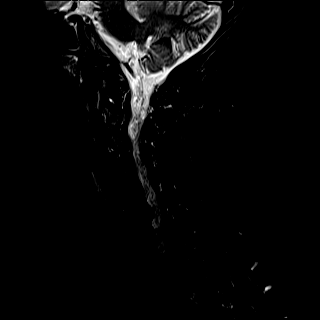
[im 10/15]
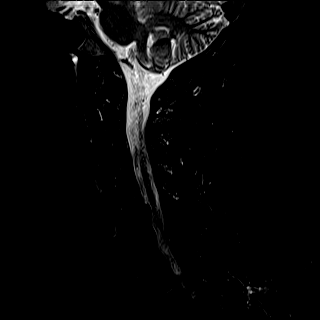
[im 15/15]
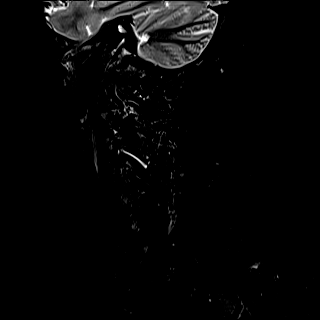

[Series 12: T2 · axial · 3.0mm · 0.70mm/px · z∈[-208,-114]mm · 8 of 29 slices shown (2 of 2)]
[im 1/29]
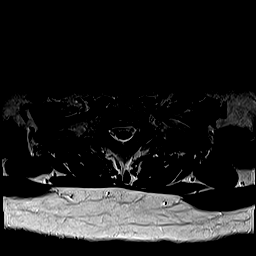
[im 5/29]
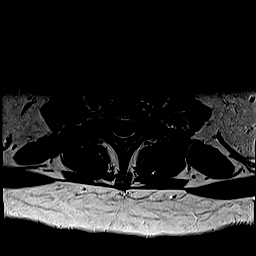
[im 9/29]
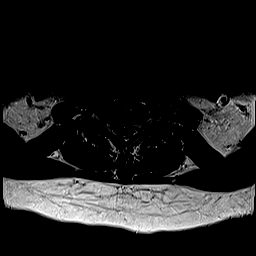
[im 13/29]
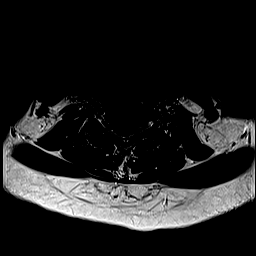
[im 17/29]
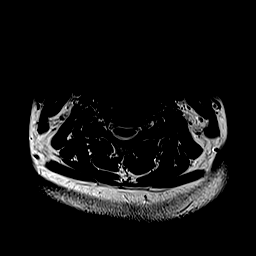
[im 21/29]
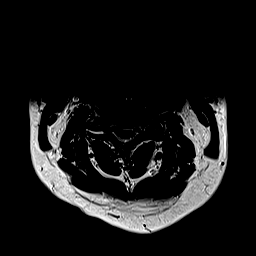
[im 25/29]
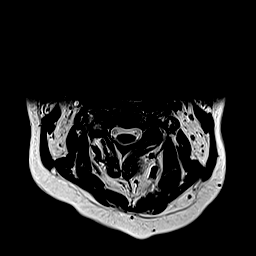
[im 29/29]
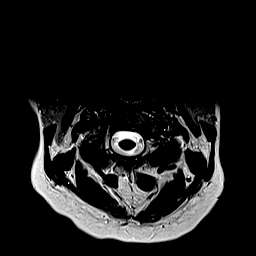

[Series 14: T1 · axial · non-contrast · 3.0mm · 0.35mm/px · z∈[-208,-114]mm · 8 of 29 slices shown]
[im 1/29]
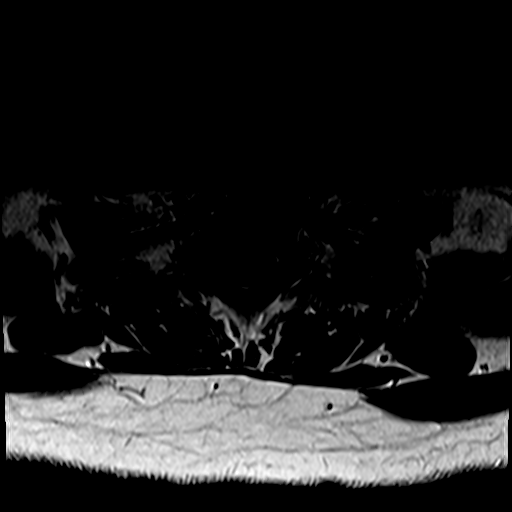
[im 5/29]
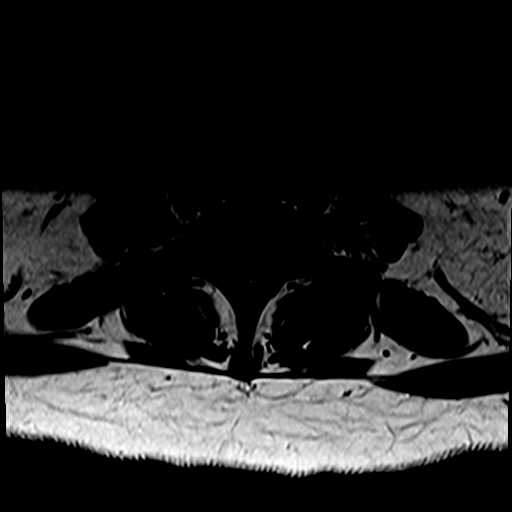
[im 9/29]
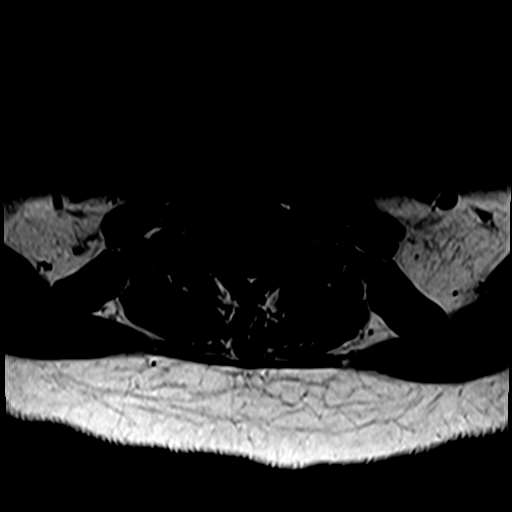
[im 13/29]
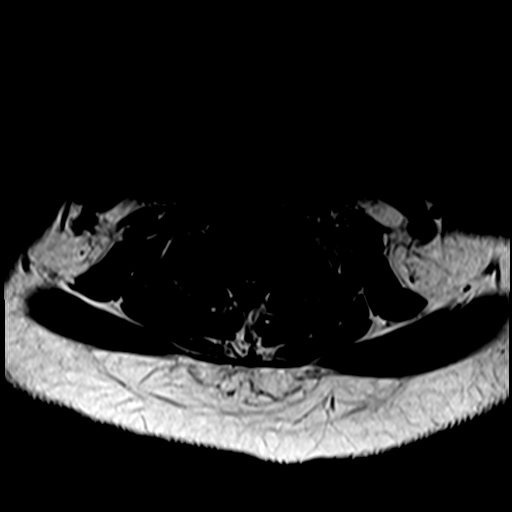
[im 17/29]
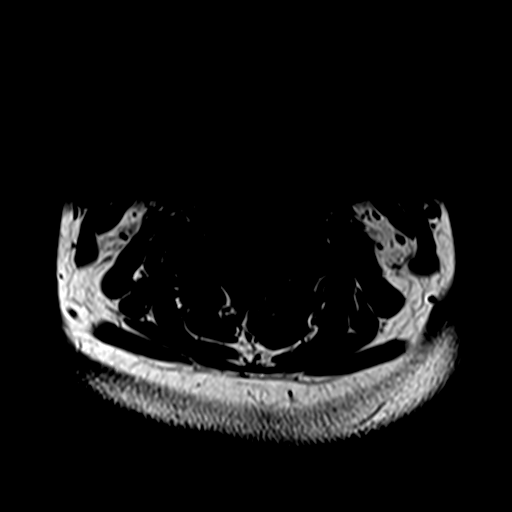
[im 21/29]
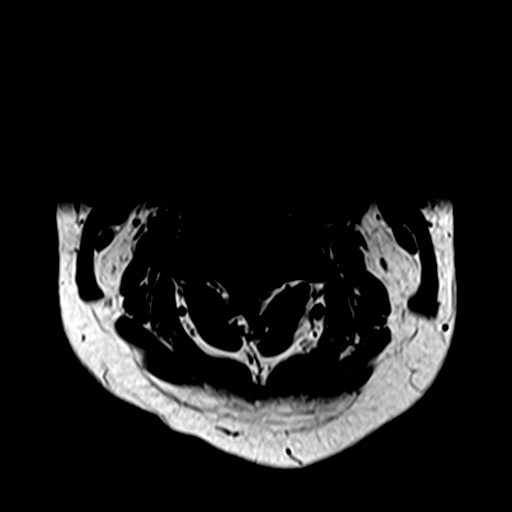
[im 25/29]
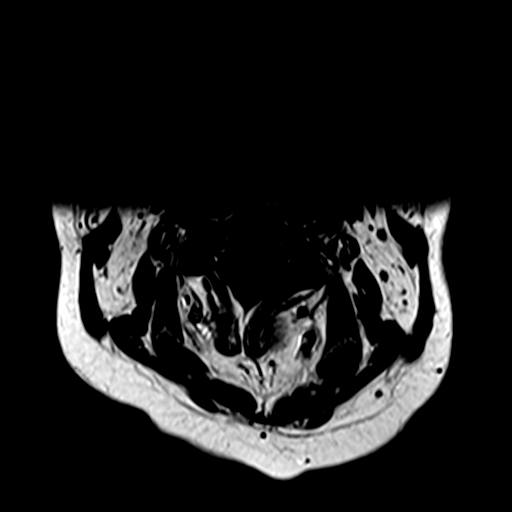
[im 29/29]
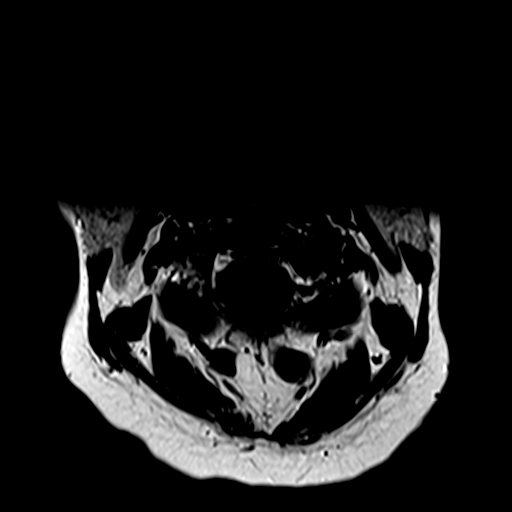

[Series 16: T1 post-contrast · axial · 3.0mm · 0.35mm/px · z∈[-208,-168]mm · 4 of 29 slices shown]
[im 1/29]
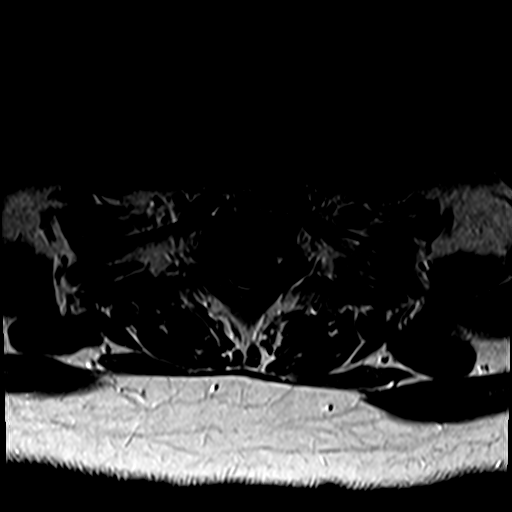
[im 5/29]
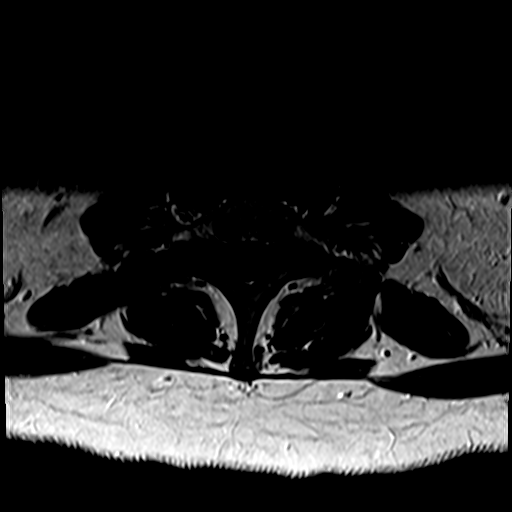
[im 9/29]
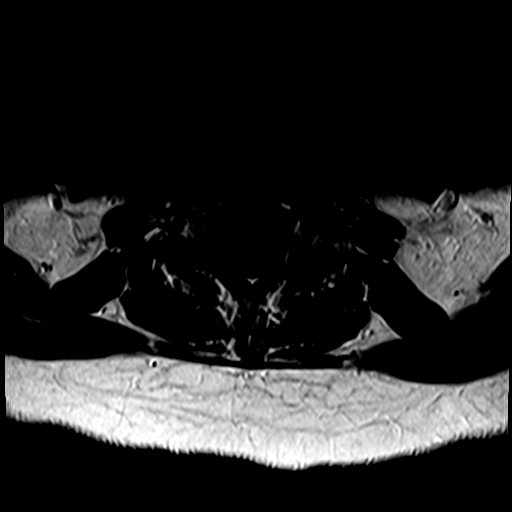
[im 13/29]
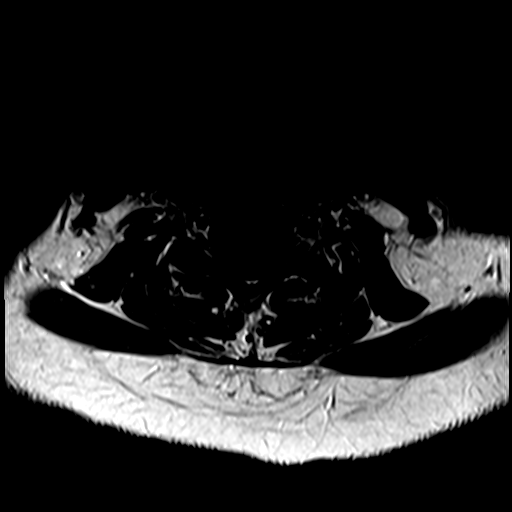

[28 of 48 positions shown; findings below may reference images not displayed]

FINDINGS: MRI HEAD FINDINGS

Brain: No acute infarct, mass effect or extra-axial collection. No
acute or chronic hemorrhage. Normal white matter signal, parenchymal
volume and CSF spaces. The midline structures are normal. There is
no abnormal contrast enhancement.

Vascular: Major flow voids are preserved.

Skull and upper cervical spine: Normal calvarium and skull base.
Visualized upper cervical spine and soft tissues are normal.

Sinuses/Orbits:No paranasal sinus fluid levels or advanced mucosal
thickening. No mastoid or middle ear effusion. Normal orbits.

MRI CERVICAL SPINE FINDINGS

Alignment: Physiologic.

Vertebrae: No fracture, evidence of discitis, or bone lesion.

Cord: Normal signal and morphology.

Posterior Fossa, vertebral arteries, paraspinal tissues: Negative.

Disc levels:

C1-2: Unremarkable.

C2-3: Normal disc space and facet joints. There is no spinal canal
stenosis. No neural foraminal stenosis.

C3-4: Small central disc protrusion mild right facet arthrosis.
There is no spinal canal stenosis. No neural foraminal stenosis.

C4-5: Normal disc space and facet joints. There is no spinal canal
stenosis. No neural foraminal stenosis.

C5-6: Left foraminal disc protrusion with bilateral uncovertebral
hypertrophy. There is no spinal canal stenosis. Severe left neural
foraminal stenosis.

C6-7: Normal disc space and facet joints. There is no spinal canal
stenosis. No neural foraminal stenosis.

C7-T1: Normal disc space and facet joints. There is no spinal canal
stenosis. No neural foraminal stenosis.
IMPRESSION: 1. Normal MRI of the brain. No metastatic disease to the cervical
spine.
2. Severe left C5-6 neural foraminal stenosis secondary to left
foraminal disc protrusion and uncovertebral hypertrophy.

## 2022-02-08 IMAGING — MR MR HEAD WO/W CM
14 series · 48 of 48 positions shown · IV contrast (gadavist)
Comparison: None Available.

CLINICAL DATA: Right-sided weakness.  Breast carcinoma.

EXAM:
MRI HEAD WITHOUT AND WITH CONTRAST
MRI CERVICAL SPINE WITHOUT AND WITH CONTRAST
TECHNIQUE: Multiplanar, multiecho pulse sequences of the brain and surrounding
structures, and cervical spine, to include the craniocervical
junction and cervicothoracic junction, were obtained without and
with intravenous contrast.
CONTRAST:  10mL GADAVIST GADOBUTROL 1 MMOL/ML IV SOLN

[Series 5: ax dwi_tracew · axial · 3.0mm · 0.65mm/px · z∈[-81,+73]mm · 2 of 48 slices shown]
[im 1/48]
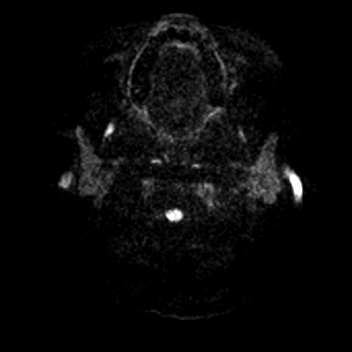
[im 48/48]
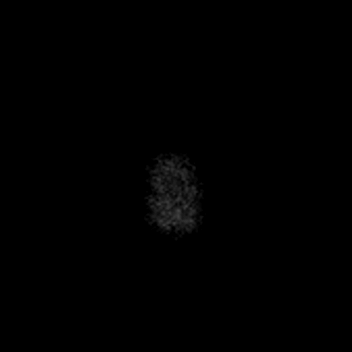

[Series 6: ax dwi_adc · axial · 3.0mm · 0.65mm/px · z∈[-81,+73]mm · 3 of 48 slices shown]
[im 1/48]
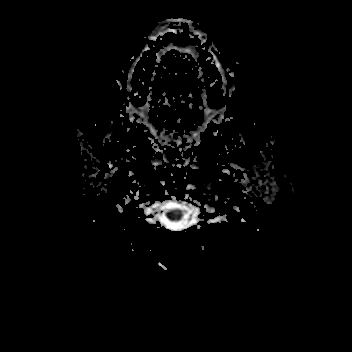
[im 24/48]
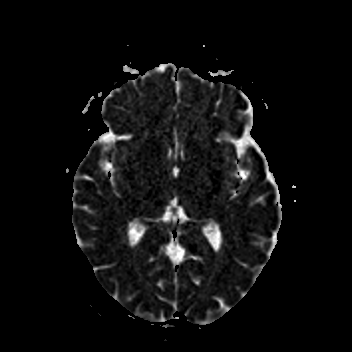
[im 48/48]
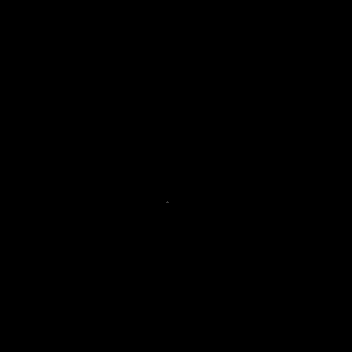

[Series 7: cor dwi_tracew · coronal · 5.0mm · 0.68mm/px · 2 of 40 slices shown]
[im 1/40]
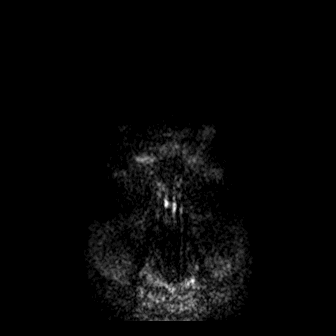
[im 40/40]
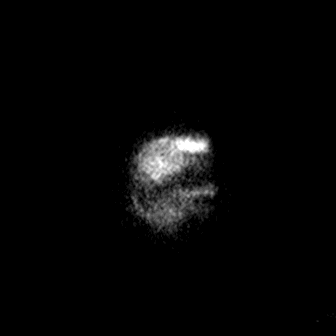

[Series 8: cor dwi_adc · coronal · 5.0mm · 0.68mm/px · 2 of 40 slices shown]
[im 1/40]
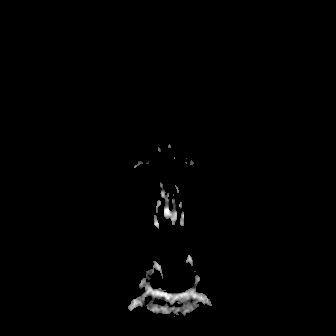
[im 40/40]
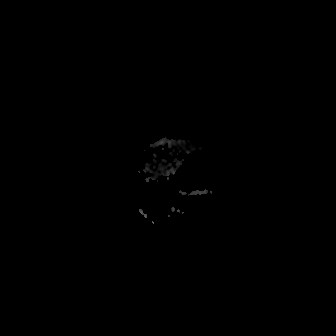

[Series 9: T1 · sagittal · 5.0mm · 0.62mm/px · 1 of 23 slices shown (1 of 2)]
[im 1/23]
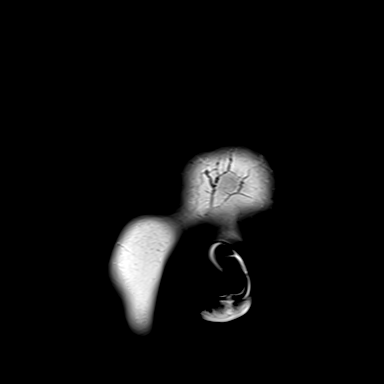

[Series 10: T2 · axial · 5.0mm · 0.53mm/px · z∈[-83,+72]mm · 2 of 27 slices shown]
[im 1/27]
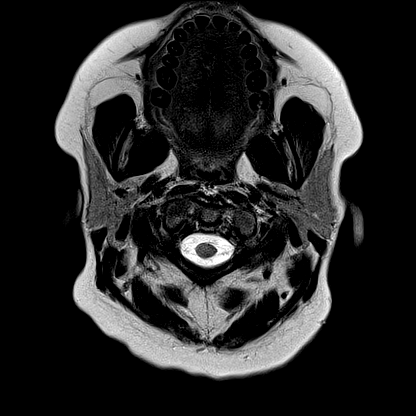
[im 27/27]
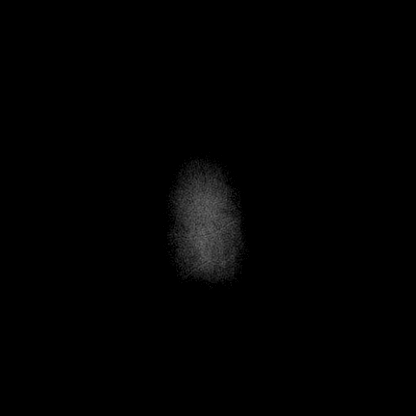

[Series 12: ax swi_pha · axial · 2.0mm · 0.90mm/px · z∈[-85,+73]mm · 5 of 80 slices shown]
[im 1/80]
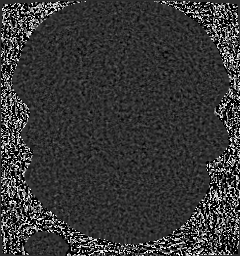
[im 20/80]
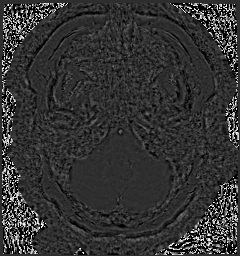
[im 40/80]
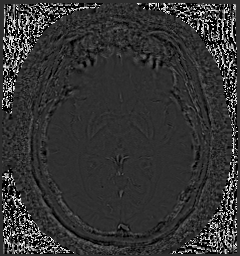
[im 60/80]
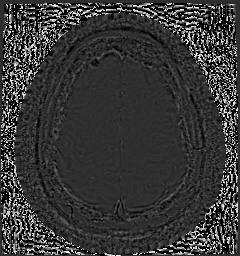
[im 80/80]
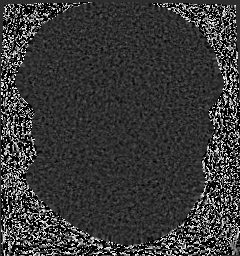

[Series 13: ax swi_swi · axial · 2.0mm · 0.90mm/px · z∈[-85,+73]mm · 5 of 80 slices shown]
[im 1/80]
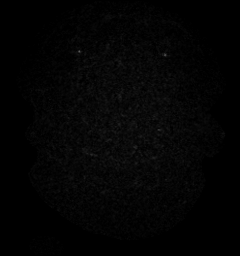
[im 20/80]
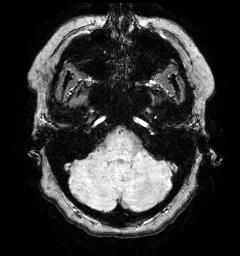
[im 40/80]
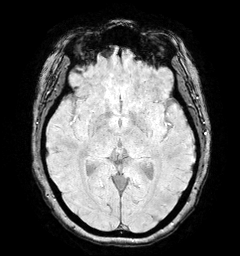
[im 60/80]
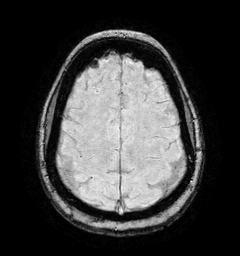
[im 80/80]
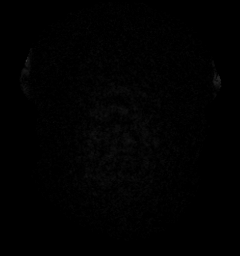

[Series 15: FLAIR · axial · 3.0mm · 0.53mm/px · z∈[-86,+75]mm · 3 of 55 slices shown]
[im 1/55]
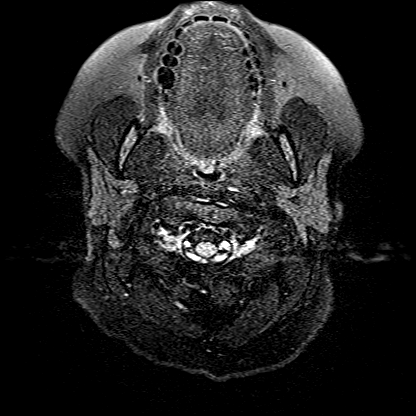
[im 28/55]
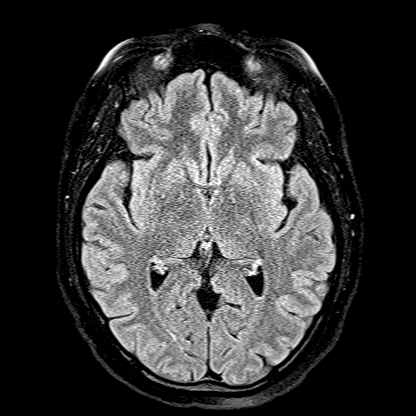
[im 55/55]
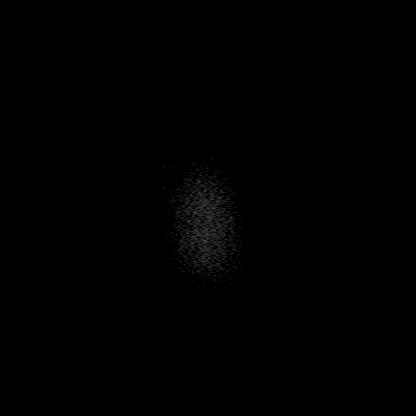

[Series 16: T1 · axial · 1.0mm · 0.98mm/px · z∈[-82,+76]mm · 9 of 160 slices shown (2 of 2)]
[im 1/160]
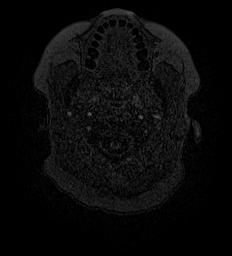
[im 20/160]
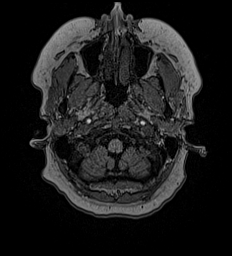
[im 40/160]
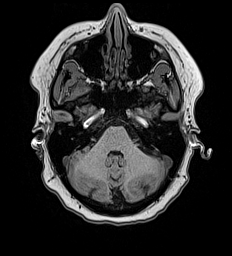
[im 60/160]
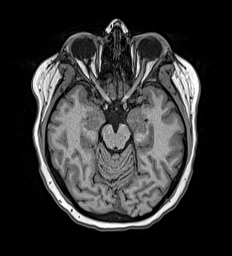
[im 80/160]
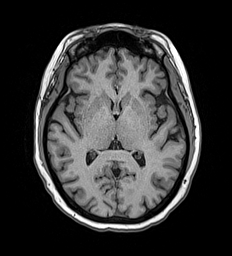
[im 100/160]
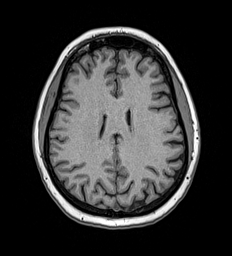
[im 120/160]
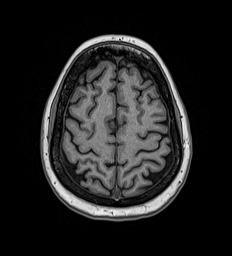
[im 140/160]
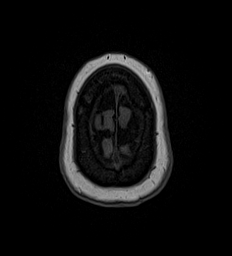
[im 160/160]
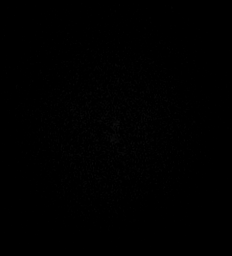

[Series 17: T2 post-contrast · coronal · 5.0mm · 0.57mm/px · 2 of 29 slices shown]
[im 1/29]
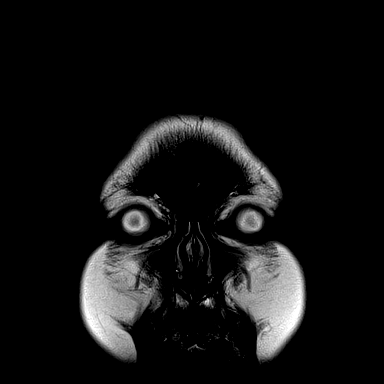
[im 29/29]
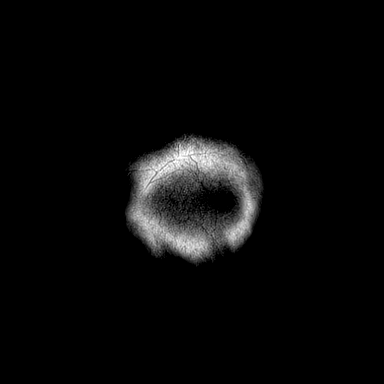

[Series 18: T1 post-contrast · axial · 1.0mm · 0.98mm/px · z∈[-82,+76]mm · 9 of 160 slices shown (1 of 3)]
[im 1/160]
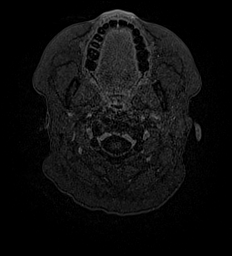
[im 20/160]
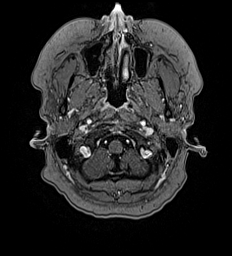
[im 40/160]
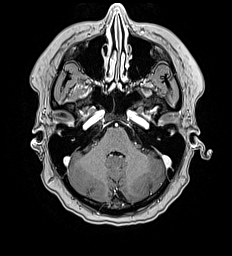
[im 60/160]
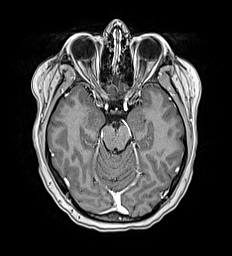
[im 80/160]
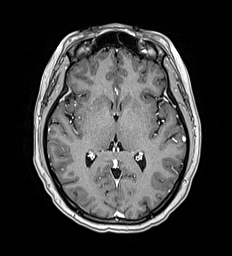
[im 100/160]
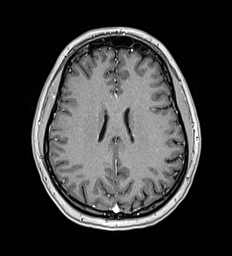
[im 120/160]
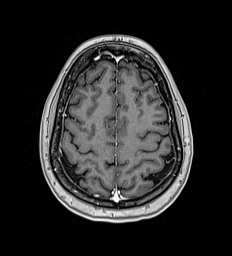
[im 140/160]
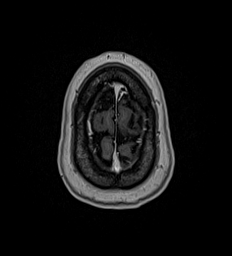
[im 160/160]
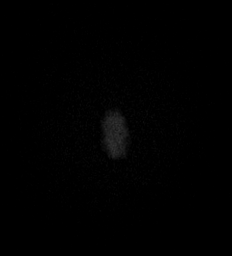

[Series 19: T1 post-contrast · coronal · 5.0mm · 0.57mm/px · 2 of 29 slices shown (2 of 3)]
[im 1/29]
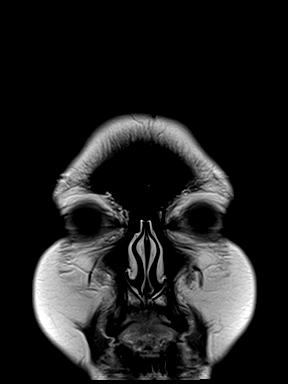
[im 29/29]
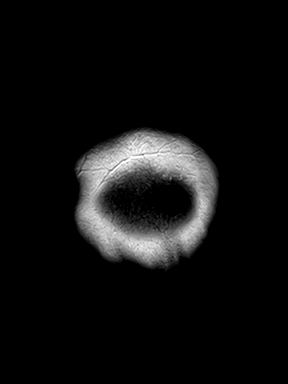

[Series 20: T1 post-contrast · sagittal · 5.0mm · 0.62mm/px · 1 of 23 slices shown (3 of 3)]
[im 1/23]
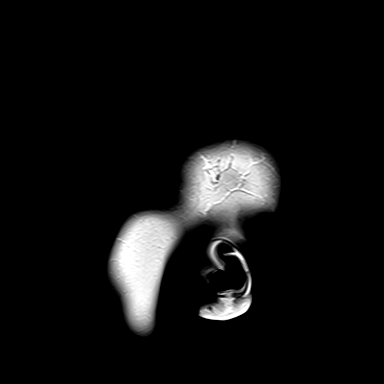

[48 of 48 positions shown; findings below may reference images not displayed]

FINDINGS: MRI HEAD FINDINGS

Brain: No acute infarct, mass effect or extra-axial collection. No
acute or chronic hemorrhage. Normal white matter signal, parenchymal
volume and CSF spaces. The midline structures are normal. There is
no abnormal contrast enhancement.

Vascular: Major flow voids are preserved.

Skull and upper cervical spine: Normal calvarium and skull base.
Visualized upper cervical spine and soft tissues are normal.

Sinuses/Orbits:No paranasal sinus fluid levels or advanced mucosal
thickening. No mastoid or middle ear effusion. Normal orbits.

MRI CERVICAL SPINE FINDINGS

Alignment: Physiologic.

Vertebrae: No fracture, evidence of discitis, or bone lesion.

Cord: Normal signal and morphology.

Posterior Fossa, vertebral arteries, paraspinal tissues: Negative.

Disc levels:

C1-2: Unremarkable.

C2-3: Normal disc space and facet joints. There is no spinal canal
stenosis. No neural foraminal stenosis.

C3-4: Small central disc protrusion mild right facet arthrosis.
There is no spinal canal stenosis. No neural foraminal stenosis.

C4-5: Normal disc space and facet joints. There is no spinal canal
stenosis. No neural foraminal stenosis.

C5-6: Left foraminal disc protrusion with bilateral uncovertebral
hypertrophy. There is no spinal canal stenosis. Severe left neural
foraminal stenosis.

C6-7: Normal disc space and facet joints. There is no spinal canal
stenosis. No neural foraminal stenosis.

C7-T1: Normal disc space and facet joints. There is no spinal canal
stenosis. No neural foraminal stenosis.
IMPRESSION: 1. Normal MRI of the brain. No metastatic disease to the cervical
spine.
2. Severe left C5-6 neural foraminal stenosis secondary to left
foraminal disc protrusion and uncovertebral hypertrophy.

## 2022-02-08 MED ORDER — GADOBUTROL 1 MMOL/ML IV SOLN
9.0000 mL | Freq: Once | INTRAVENOUS | Status: AC | PRN
  Administered 2022-02-08: 10 mL via INTRAVENOUS

## 2022-02-09 ENCOUNTER — Other Ambulatory Visit: Payer: Self-pay

## 2022-02-09 ENCOUNTER — Encounter: Payer: Self-pay | Admitting: Licensed Clinical Social Worker

## 2022-02-09 ENCOUNTER — Telehealth: Payer: Self-pay | Admitting: *Deleted

## 2022-02-09 ENCOUNTER — Encounter: Payer: Self-pay | Admitting: *Deleted

## 2022-02-09 ENCOUNTER — Inpatient Hospital Stay: Payer: 59

## 2022-02-09 DIAGNOSIS — Z95828 Presence of other vascular implants and grafts: Secondary | ICD-10-CM

## 2022-02-09 DIAGNOSIS — C50812 Malignant neoplasm of overlapping sites of left female breast: Secondary | ICD-10-CM | POA: Diagnosis not present

## 2022-02-09 DIAGNOSIS — Z171 Estrogen receptor negative status [ER-]: Secondary | ICD-10-CM

## 2022-02-09 DIAGNOSIS — R7401 Elevation of levels of liver transaminase levels: Secondary | ICD-10-CM

## 2022-02-09 LAB — COMPREHENSIVE METABOLIC PANEL
ALT: 150 U/L — ABNORMAL HIGH (ref 0–44)
AST: 120 U/L — ABNORMAL HIGH (ref 15–41)
Albumin: 3.5 g/dL (ref 3.5–5.0)
Alkaline Phosphatase: 321 U/L — ABNORMAL HIGH (ref 38–126)
Anion gap: 7 (ref 5–15)
BUN: 15 mg/dL (ref 6–20)
CO2: 26 mmol/L (ref 22–32)
Calcium: 8.8 mg/dL — ABNORMAL LOW (ref 8.9–10.3)
Chloride: 105 mmol/L (ref 98–111)
Creatinine, Ser: 0.6 mg/dL (ref 0.44–1.00)
GFR, Estimated: >60 mL/min (ref >60)
Glucose, Bld: 92 mg/dL (ref 70–99)
Potassium: 3.3 mmol/L — ABNORMAL LOW (ref 3.5–5.1)
Sodium: 138 mmol/L (ref 135–145)
Total Bilirubin: 1.4 mg/dL — ABNORMAL HIGH (ref 0.3–1.2)
Total Protein: 6 g/dL — ABNORMAL LOW (ref 6.5–8.1)

## 2022-02-09 MED ORDER — HEPARIN SOD (PORK) LOCK FLUSH 100 UNIT/ML IV SOLN
500.0000 [IU] | Freq: Once | INTRAVENOUS | Status: AC
  Administered 2022-02-09: 500 [IU] via INTRAVENOUS
  Filled 2022-02-09: qty 5

## 2022-02-09 MED ORDER — SODIUM CHLORIDE 0.9% FLUSH
10.0000 mL | Freq: Once | INTRAVENOUS | Status: AC
  Administered 2022-02-09: 10 mL via INTRAVENOUS
  Filled 2022-02-09: qty 10

## 2022-02-09 NOTE — Progress Notes (Signed)
[place] CSW Progress Note ? ?Clinical Social Worker  received call from patient requesting information  to apply fr disability. CSW explained disability application process and timeline and emailed patient [place] consent form to [email or web address].  CSW included information on other financial assistance programs and will send referral to [employer]. ? ? ?Bailyn Spackman , LCSW ?

## 2022-02-09 NOTE — Telephone Encounter (Signed)
Patient requesting a sugar free cholestyramine ?

## 2022-02-10 ENCOUNTER — Other Ambulatory Visit: Payer: Self-pay | Admitting: Oncology

## 2022-02-10 ENCOUNTER — Encounter: Payer: Self-pay | Admitting: Oncology

## 2022-02-10 ENCOUNTER — Encounter: Payer: Self-pay | Admitting: Internal Medicine

## 2022-02-10 MED ORDER — CHOLESTYRAMINE LIGHT 4 G PO PACK
PACK | ORAL | 0 refills | Status: DC
Start: 2022-02-10 — End: 2022-05-05

## 2022-02-11 ENCOUNTER — Encounter: Payer: Self-pay | Admitting: *Deleted

## 2022-02-11 ENCOUNTER — Encounter: Payer: Self-pay | Admitting: Oncology

## 2022-02-11 ENCOUNTER — Encounter: Payer: Self-pay | Admitting: Internal Medicine

## 2022-02-11 NOTE — Progress Notes (Signed)
I send message the day you sent me the message and she is happy about coming down on prednisone and we moved her out 2 weeks for labs ?

## 2022-02-13 ENCOUNTER — Other Ambulatory Visit: Payer: Self-pay | Admitting: Oncology

## 2022-02-13 ENCOUNTER — Encounter: Payer: Self-pay | Admitting: Internal Medicine

## 2022-02-13 ENCOUNTER — Encounter: Payer: Self-pay | Admitting: Oncology

## 2022-02-13 MED ORDER — GABAPENTIN 300 MG PO CAPS: 300.0000 mg | ORAL_CAPSULE | Freq: Three times a day (TID) | ORAL | 3 refills | Status: DC

## 2022-02-16 ENCOUNTER — Encounter: Payer: Self-pay | Admitting: Oncology

## 2022-02-16 ENCOUNTER — Inpatient Hospital Stay: Payer: 59 | Admitting: Oncology

## 2022-02-16 ENCOUNTER — Encounter: Payer: Self-pay | Admitting: Internal Medicine

## 2022-02-16 ENCOUNTER — Inpatient Hospital Stay: Payer: 59

## 2022-02-16 NOTE — Telephone Encounter (Signed)
Pt was sent dapsone on 5/10 and MD rao said to hold off on trazadone. Pt was told about this ?

## 2022-02-17 ENCOUNTER — Encounter: Payer: Self-pay | Admitting: Oncology

## 2022-02-20 ENCOUNTER — Other Ambulatory Visit: Payer: Self-pay

## 2022-02-20 DIAGNOSIS — C50412 Malignant neoplasm of upper-outer quadrant of left female breast: Secondary | ICD-10-CM

## 2022-02-23 ENCOUNTER — Inpatient Hospital Stay: Payer: 59

## 2022-02-23 ENCOUNTER — Telehealth: Payer: Self-pay | Admitting: *Deleted

## 2022-02-23 ENCOUNTER — Inpatient Hospital Stay (HOSPITAL_BASED_OUTPATIENT_CLINIC_OR_DEPARTMENT_OTHER): Payer: 59 | Admitting: Oncology

## 2022-02-23 ENCOUNTER — Encounter: Payer: Self-pay | Admitting: Oncology

## 2022-02-23 VITALS — BP 127/60 | HR 102 | Temp 97.8°F | Resp 18 | Wt 201.8 lb

## 2022-02-23 DIAGNOSIS — Z95828 Presence of other vascular implants and grafts: Secondary | ICD-10-CM

## 2022-02-23 DIAGNOSIS — Z853 Personal history of malignant neoplasm of breast: Secondary | ICD-10-CM | POA: Diagnosis not present

## 2022-02-23 DIAGNOSIS — R7401 Elevation of levels of liver transaminase levels: Secondary | ICD-10-CM

## 2022-02-23 DIAGNOSIS — Z08 Encounter for follow-up examination after completed treatment for malignant neoplasm: Secondary | ICD-10-CM

## 2022-02-23 DIAGNOSIS — C50812 Malignant neoplasm of overlapping sites of left female breast: Secondary | ICD-10-CM | POA: Diagnosis not present

## 2022-02-23 DIAGNOSIS — C50412 Malignant neoplasm of upper-outer quadrant of left female breast: Secondary | ICD-10-CM

## 2022-02-23 DIAGNOSIS — K754 Autoimmune hepatitis: Secondary | ICD-10-CM

## 2022-02-23 LAB — CBC WITH DIFFERENTIAL/PLATELET
Abs Immature Granulocytes: 0.65 10*3/uL — ABNORMAL HIGH (ref 0.00–0.07)
Basophils Absolute: 0.1 10*3/uL (ref 0.0–0.1)
Basophils Relative: 0 %
Eosinophils Absolute: 0 10*3/uL (ref 0.0–0.5)
Eosinophils Relative: 0 %
HCT: 35.5 % — ABNORMAL LOW (ref 36.0–46.0)
Hemoglobin: 11.7 g/dL — ABNORMAL LOW (ref 12.0–15.0)
Immature Granulocytes: 5 %
Lymphocytes Relative: 3 %
Lymphs Abs: 0.3 10*3/uL — ABNORMAL LOW (ref 0.7–4.0)
MCH: 32.2 pg (ref 26.0–34.0)
MCHC: 33 g/dL (ref 30.0–36.0)
MCV: 97.8 fL (ref 80.0–100.0)
Monocytes Absolute: 0.6 10*3/uL (ref 0.1–1.0)
Monocytes Relative: 5 %
Neutro Abs: 11.7 10*3/uL — ABNORMAL HIGH (ref 1.7–7.7)
Neutrophils Relative %: 87 %
Platelets: 154 10*3/uL (ref 150–400)
RBC: 3.63 MIL/uL — ABNORMAL LOW (ref 3.87–5.11)
RDW: 17.8 % — ABNORMAL HIGH (ref 11.5–15.5)
WBC: 13.4 10*3/uL — ABNORMAL HIGH (ref 4.0–10.5)
nRBC: 0 % (ref 0.0–0.2)

## 2022-02-23 LAB — COMPREHENSIVE METABOLIC PANEL
ALT: 116 U/L — ABNORMAL HIGH (ref 0–44)
AST: 102 U/L — ABNORMAL HIGH (ref 15–41)
Albumin: 4 g/dL (ref 3.5–5.0)
Alkaline Phosphatase: 291 U/L — ABNORMAL HIGH (ref 38–126)
Anion gap: 5 (ref 5–15)
BUN: 12 mg/dL (ref 6–20)
CO2: 26 mmol/L (ref 22–32)
Calcium: 8.6 mg/dL — ABNORMAL LOW (ref 8.9–10.3)
Chloride: 106 mmol/L (ref 98–111)
Creatinine, Ser: 0.65 mg/dL (ref 0.44–1.00)
GFR, Estimated: >60 mL/min (ref >60)
Glucose, Bld: 134 mg/dL — ABNORMAL HIGH (ref 70–99)
Potassium: 3.6 mmol/L (ref 3.5–5.1)
Sodium: 137 mmol/L (ref 135–145)
Total Bilirubin: 2 mg/dL — ABNORMAL HIGH (ref 0.3–1.2)
Total Protein: 6.6 g/dL (ref 6.5–8.1)

## 2022-02-23 MED ORDER — SODIUM CHLORIDE 0.9% FLUSH
10.0000 mL | Freq: Once | INTRAVENOUS | Status: AC
  Administered 2022-02-23: 10 mL via INTRAVENOUS
  Filled 2022-02-23: qty 10

## 2022-02-23 MED ORDER — HEPARIN SOD (PORK) LOCK FLUSH 100 UNIT/ML IV SOLN
500.0000 [IU] | Freq: Once | INTRAVENOUS | Status: AC
  Administered 2022-02-23: 500 [IU] via INTRAVENOUS
  Filled 2022-02-23: qty 5

## 2022-02-23 NOTE — Telephone Encounter (Signed)
Dr. Janese Banks has gotten a message from Dr. Lavone Neri with the counts today on [place] and she now needs to take 1250 mg of the MMF and change the prednisone to 25 mg.  Per the patient she has like most of a bottle of the MMF and she has 10 mg prednisone as well as 50 mg prednisone and as of today Dr. Lavone Neri wants her to be on 25 mg a day.  Patient says that she can break the 50 mg prednisone and that we will make 25 mg each day.  After she runs out of them she has 10 mg prednisone and she can take 2 of them and then the third 1 break it in half and that will equal 25 mg daily.  Patient was told to start the MMF tonight with the new dose and then start the prednisone in the morning at 25 mg.  She will come to the cancer center every 2 weeks for lab work and then determine what we do with MMF as well as the prednisone dose.  Patient is agreeable to this and had no questions

## 2022-02-25 ENCOUNTER — Other Ambulatory Visit: Payer: Self-pay | Admitting: Oncology

## 2022-02-26 ENCOUNTER — Encounter: Payer: Self-pay | Admitting: Oncology

## 2022-02-26 ENCOUNTER — Encounter: Payer: Self-pay | Admitting: Internal Medicine

## 2022-02-26 NOTE — Progress Notes (Signed)
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  Ultrasound of the left axilla demonstrates 2 lymph nodes with mild thickened cortices of 4 mm.  There is skin thickening on the lower inner quadrant of the left breast on mammography.  Both the breast mass and the lymph node were biopsied and was positive for invasive mammary carcinoma grade 3.  Lymph node was also suspicious for extracapsular extension.    ER/PR and HER-2 negative   MRI showed irregular enhancing mass at the 12 o'clock position of the left breast measuring 2.5 x 2.3 cm and a linear component extending 2.3 cm anteriorly.  The mass in the anterior linear extension combined measure 4.3 cm.  3.9 cm in the cephalocaudal dimension.  Also an area of clumped non-mass enhancement in the posterior aspect of the lower quadrant of the left breast measuring 2.6 x 0.9 cm which looks suspicious.  2 enlarged left axillary lymph nodes and 2 mildly enlarged internal mammary lymph nodes.  4.3  cm clumped area of non-mass enhancement in the outer quadrant of the right breast.  3 right axillary lymph nodes with mild cortical thickening.   Patient underwent biopsy of the right breast non-mass enhancement and that was negative for malignancy   CT scan showed mildly enlarged right inguinal lymph nodes nonspecific.  Left upper breast mass with prominent left axillary lymph nodes.  Subcentimeter pulmonary nodules which are calcified and compatible with benign old granulomatous disease.  0.6 5.4 cm lucent lesion in the left first rib possibly a hemangioma or benign lesion.   Patient developed significant infusion reaction to carboplatin with dose 9 when her blood pressure dropped and she became tachycardic and significantly nauseous.  She went on to complete [facility] Keytruda chemotherapy as per keynote 522 regimen.  Patient underwent bilateral mastectomy with reconstruction in September 2022.  Final pathology showed complete pathological response2 sentinel lymph nodes negative for malignancy.  No malignancy noted in the right breast.   Plan was to complete 9 cycles of adjuvant Keytruda.  Patient noted to have abnormal LFTs on Keytruda.  Last dose of Keytruda given on 10/08/2021.  Subsequently her LFTs were significantly elevated and patient did not receive any further Keytruda.  She was admitted to [facility] for grade 4 hepatitis likely secondary to [facility] and was started on Solu-Medrol and is currently on prednisone taper and mycophenolate    Interval history-continues to endorse fatigue.  Reports that her right hand weakness is somewhat worse  ECOG PS- 2 Pain scale- 0   Review of systems- Review of Systems  Constitutional:  Positive for malaise/fatigue. Negative for chills, fever and weight  loss.  HENT:  Negative for congestion, ear discharge and nosebleeds.   Eyes:  Negative for blurred vision.  Respiratory:  Negative for cough, hemoptysis, sputum production, shortness of breath and wheezing.    Cardiovascular:  Negative for chest pain, palpitations, orthopnea and claudication.  Gastrointestinal:  Negative for abdominal pain, blood in stool, constipation, diarrhea, heartburn, melena, nausea and vomiting.  Genitourinary:  Negative for dysuria, flank pain, frequency, hematuria and urgency.  Musculoskeletal:  Negative for back pain, joint pain and myalgias.  Skin:  Negative for rash.  Neurological:  Negative for dizziness, tingling, focal weakness, seizures, weakness and headaches.  Endo/Heme/Allergies:  Does not bruise/bleed easily.  Psychiatric/Behavioral:  Negative for depression and suicidal ideas. The patient does not have insomnia.      Allergies  Allergen Reactions   Carboplatin Shortness Of Breath, Nausea And Vomiting and Other (See Comments)    Flushing- chest , face, neck , arm including hand   Amoxicillin     Other reaction(s): "too young to remember what they do to me"   Sulfa Antibiotics     Other reaction(s): "too young to remember what they do to me"     Past Medical History:  Diagnosis Date   Anxiety    Asthma    Breast cancer ([place]) 11/2020   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 breast ca   Depression    Family history of cancer    History of chemotherapy    Varicose veins of bilateral lower extremities with pain      Past Surgical History:  Procedure Laterality Date   APPENDECTOMY  2018   BILATERAL TOTAL MASTECTOMY WITH AXILLARY LYMPH NODE DISSECTION Bilateral 06/25/2021   Procedure: BILATERAL TOTAL MASTECTOMY WITH LEFT AXILLARY LYMPH NODE BIOPSY VS. AXILLARY NODE DISSECTION;  Surgeon: Herbert Pun, MD;  Location: [facility] ORS;  Service: General;  Laterality: Bilateral;  Dillingham, 1.5 hours Cintron-Diaz 2.5 hours   BREAST BIOPSY Left 11/26/2020   vision 12:00 6cmfn [facility]   BREAST BIOPSY Left 11/26/2020   LN bx, hydro marker,  fragments of macrometastatic carcinoma   BREAST RECONSTRUCTION WITH PLACEMENT OF TISSUE EXPANDER AND FLEX HD (ACELLULAR HYDRATED DERMIS)  Bilateral 06/25/2021   Procedure: BREAST RECONSTRUCTION WITH PLACEMENT OF TISSUE EXPANDER AND FLEX HD (ACELLULAR HYDRATED DERMIS);  Surgeon: Wallace Going, DO;  Location: [facility] ORS;  Service: Plastics;  Laterality: Bilateral;   PORTACATH PLACEMENT Right 12/13/2020   Procedure: INSERTION PORT-A-CATH;  Surgeon: Herbert Pun, MD;  Location: [facility] OR[facility];  Service: General;  Laterality: Right;   REMOVAL OF BILATERAL TISSUE EXPANDERS WITH PLACEMENT OF BILATERAL BREAST IMPLANTS Bilateral 09/15/2021   Procedure: REMOVAL OF BILATERAL TISSUE EXPANDERS;  Surgeon: Wallace Going, DO;  Location: [place];  Service: Plastics;  Laterality: Bilateral;    Social History   Socioeconomic History   Marital status: Married    Spouse name: Not on file   Number of children: Not on file   Years of education: Not on file   Highest education level: Not on file  Occupational History   Not on file  Tobacco Use   Smoking status: Never   Smokeless tobacco: Never  Vaping Use   Vaping Use: Never used  Substance and Sexual Activity   Alcohol use: Not Currently    Comment: occassinally    Drug use: Yes    Types: Marijuana    Comment: occ   Sexual activity: Yes  Other Topics Concern   Not on file  Social History Narrative   Not on file  Social Determinants of Health   Financial Resource Strain: Not on file  Food Insecurity: Not on file  Transportation Needs: Not on file  Physical Activity: Not on file  Stress: Not on file  Social Connections: Not on file  Intimate Partner Violence: Not on file    Family History  Problem Relation Age of Onset   Diabetes Father    Varicose Veins Father    Diabetes Paternal Aunt    Uterine cancer Paternal Aunt        precancerous   Diabetes Paternal Grandmother    Uterine cancer Paternal Grandmother        precancerous   Thyroid disease Mother    Thyroid disease Maternal Aunt    Thyroid disease Maternal Grandmother    Cancer  Other        stomach vs ovarian/cervical   Cancer Paternal Great-grandmother        unk     Current Outpatient Medications:    busPIRone (BUSPAR) 15 MG tablet, Take 15 mg by mouth 2 (two) times daily., Disp: , Rfl:    citalopram (CELEXA) 20 MG tablet, TAKE 1 TABLET BY MOUTH EVERY DAY, Disp: 90 tablet, Rfl: 1   clonazePAM (KLONOPIN) 0.5 MG tablet, Take 1 tablet (0.5 mg total) by mouth 3 (three) times daily as needed., Disp: 60 tablet, Rfl: 0   Cyanocobalamin (B-12 PO), Take 1 tablet by mouth daily., Disp: , Rfl:    dapsone 100 MG tablet, TAKE 1 TABLET BY MOUTH EVERY DAY, Disp: 30 tablet, Rfl: 0   docusate sodium (COLACE) 100 MG capsule, Take 100 mg by mouth daily as needed for mild constipation., Disp: , Rfl:    folic acid (FOLVITE) 1 MG tablet, TAKE 2 TABLETS BY MOUTH EVERY DAY, Disp: 180 tablet, Rfl: 0   gabapentin (NEURONTIN) 300 MG capsule, Take 1 capsule (300 mg total) by mouth 3 (three) times daily., Disp: 42 capsule, Rfl: 3   lidocaine-prilocaine (EMLA) cream, Apply 1 application topically daily as needed (prior to port access)., Disp: 30 g, Rfl: 3   methocarbamol (ROBAXIN) 500 MG tablet, TAKE 1 TABLET BY MOUTH 3 TIMES DAILY FOR 14 DAYS., Disp: 42 tablet, Rfl: 0   Multiple Vitamin (MULTIVITAMIN WITH MINERALS) TABS tablet, Take 1 tablet by mouth daily., Disp: , Rfl:    mycophenolate (CELLCEPT) 250 MG capsule, Take 1,000 mg by mouth 2 (two) times daily., Disp: , Rfl:    ondansetron (ZOFRAN ODT) 4 MG disintegrating tablet, Take 1 tablet (4 mg total) by mouth every 8 (eight) hours as needed for nausea or vomiting., Disp: 30 tablet, Rfl: 2   oxyCODONE (OXY IR/ROXICODONE) 5 MG immediate release tablet, Take 1 tablet (5 mg total) by mouth 2 (two) times daily., Disp: 60 tablet, Rfl: 0   pantoprazole (PROTONIX) 40 MG tablet, Take 40 mg by mouth daily., Disp: , Rfl:    TURMERIC CURCUMIN PO, Take 2 capsules by mouth daily., Disp: , Rfl:    cholestyramine light (PREVALITE) 4 g packet, TAKE 1 PACKET  (4 G TOTAL) BY MOUTH 2 TIMES DAILY. (Patient not taking: Reported on 02/23/2022), Disp: 90 packet, Rfl: 0   hydrOXYzine (VISTARIL) 25 MG capsule, TAKE 1 CAPSULE (25 MG TOTAL) BY MOUTH AT BEDTIME AS NEEDED FOR UP TO 20 DOSES. (Patient not taking: Reported on 12/22/2021), Disp: 20 capsule, Rfl: 0   ondansetron (ZOFRAN) 8 MG tablet, Take 1 tablet (8 mg total) by mouth every 8 (eight) hours as needed for refractory nausea / vomiting. Start on day  3 after chemo. (Patient not taking: Reported on 01/19/2022), Disp: 60 tablet, Rfl: 3   ondansetron (ZOFRAN-ODT) 4 MG disintegrating tablet, Take 1 tablet (4 mg total) by mouth every 8 (eight) hours as needed for nausea or vomiting. (Patient not taking: Reported on 01/19/2022), Disp: 20 tablet, Rfl: 0   potassium chloride SA (KLOR-CON M) 20 MEQ tablet, Take 1 tablet (20 mEq total) by mouth daily. (Patient not taking: Reported on 01/19/2022), Disp: 7 tablet, Rfl: 0   predniSONE (DELTASONE) 20 MG tablet, Take 4 tablets (80 mg total) by mouth daily with breakfast. (Patient not taking: Reported on 02/23/2022), Disp: 48 tablet, Rfl: 0   traZODone (DESYREL) 50 MG tablet, TAKE 0.5 TABLETS BY MOUTH AT BEDTIME AS NEEDED FOR SLEEP. (Patient not taking: Reported on 02/23/2022), Disp: 7 tablet, Rfl: 0   triamcinolone ointment (KENALOG) 0.5 %, Apply 1 application topically 2 (two) times daily. (Patient not taking: Reported on 02/06/2022), Disp: 30 g, Rfl: 0 No current facility-administered medications for this visit.  Facility-Administered Medications Ordered in Other Visits:    prochlorperazine (COMPAZINE) tablet 10 mg, 10 mg, Oral, Q6H PRN, Sindy Guadeloupe, MD, 10 mg at 07/16/21 1407  Physical exam:  Vitals:   02/23/22 0959  BP: 127/60  Pulse: (!) 102  Resp: 18  Temp: 97.8 F (36.6 C)  SpO2: 95%  Weight: 201 lb 12.8 oz (91.5 kg)   Physical Exam Constitutional:      General: She is not in acute distress. Cardiovascular:     Rate and Rhythm: Normal rate and regular rhythm.      Heart sounds: Normal heart sounds.  Pulmonary:     Effort: Pulmonary effort is normal.     Breath sounds: Normal breath sounds.  Abdominal:     General: Bowel sounds are normal.     Palpations: Abdomen is soft.  Skin:    General: Skin is warm and dry.  Neurological:     Mental Status: She is alert and oriented to person, place, and time.        Latest Ref Rng & Units 02/23/2022    9:13 AM  CMP  Glucose 70 - 99 mg/dL 134    BUN 6 - 20 mg/dL 12    Creatinine 0.44 - 1.00 mg/dL 0.65    Sodium 135 - 145 mmol/L 137    Potassium 3.5 - 5.1 mmol/L 3.6    Chloride 98 - 111 mmol/L 106    CO2 22 - 32 mmol/L 26    Calcium 8.9 - 10.3 mg/dL 8.6    Total Protein 6.5 - 8.1 g/dL 6.6    Total Bilirubin 0.3 - 1.2 mg/dL 2.0    Alkaline Phos 38 - 126 U/L 291    AST 15 - 41 U/L 102    ALT 0 - 44 U/L 116        Latest Ref Rng & Units 02/23/2022    9:13 AM  CBC  WBC 4.0 - 10.5 K/uL 13.4    Hemoglobin 12.0 - 15.0 g/dL 11.7    Hematocrit 36.0 - 46.0 % 35.5    Platelets 150 - 400 K/uL 154      No images are attached to the encounter.  MR Brain W Wo Contrast  Result Date: 02/08/2022 CLINICAL DATA:  Right-sided weakness.  Breast carcinoma. EXAM: MRI HEAD WITHOUT AND WITH CONTRAST MRI CERVICAL SPINE WITHOUT AND WITH CONTRAST TECHNIQUE: Multiplanar, multiecho pulse sequences of the brain and surrounding structures, and cervical spine, to include the craniocervical junction  and cervicothoracic junction, were obtained without and with intravenous contrast. CONTRAST:  59mL GADAVIST GADOBUTROL 1 MMOL/ML IV SOLN COMPARISON:  None Available. FINDINGS: MRI HEAD FINDINGS Brain: No acute infarct, mass effect or extra-axial collection. No acute or chronic hemorrhage. Normal white matter signal, parenchymal volume and CSF spaces. The midline structures are normal. There is no abnormal contrast enhancement. Vascular: Major flow voids are preserved. Skull and upper cervical spine: Normal calvarium and skull base.  Visualized upper cervical spine and soft tissues are normal. Sinuses/Orbits:No paranasal sinus fluid levels or advanced mucosal thickening. No mastoid or middle ear effusion. Normal orbits. MRI CERVICAL SPINE FINDINGS Alignment: Physiologic. Vertebrae: No fracture, evidence of discitis, or bone lesion. Cord: Normal signal and morphology. Posterior Fossa, vertebral arteries, paraspinal tissues: Negative. Disc levels: C1-2: Unremarkable. C2-3: Normal disc space and facet joints. There is no spinal canal stenosis. No neural foraminal stenosis. C3-4: Small central disc protrusion mild right facet arthrosis. There is no spinal canal stenosis. No neural foraminal stenosis. C4-5: Normal disc space and facet joints. There is no spinal canal stenosis. No neural foraminal stenosis. C5-6: Left foraminal disc protrusion with bilateral uncovertebral hypertrophy. There is no spinal canal stenosis. Severe left neural foraminal stenosis. C6-7: Normal disc space and facet joints. There is no spinal canal stenosis. No neural foraminal stenosis. C7-T1: Normal disc space and facet joints. There is no spinal canal stenosis. No neural foraminal stenosis. IMPRESSION: 1. Normal MRI of the brain. No metastatic disease to the cervical spine. 2. 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 Electronically Signed   By: Ulyses Jarred M.D.   On: 02/08/2022 19:23   MR Cervical Spine W Wo Contrast  Result Date: 02/08/2022 CLINICAL DATA:  Right-sided weakness.  Breast carcinoma. EXAM: MRI HEAD WITHOUT AND WITH CONTRAST MRI CERVICAL SPINE WITHOUT AND WITH CONTRAST TECHNIQUE: Multiplanar, multiecho pulse sequences of the brain and surrounding structures, and cervical spine, to include the craniocervical junction and cervicothoracic junction, were obtained without and with intravenous contrast. CONTRAST:  10mL GADAVIST GADOBUTROL 1 MMOL/ML IV SOLN COMPARISON:  None Available.  FINDINGS: MRI HEAD FINDINGS Brain: No acute infarct, mass effect or extra-axial collection. No acute or chronic hemorrhage. Normal white matter signal, parenchymal volume and CSF spaces. The midline structures are normal. There is no abnormal contrast enhancement. Vascular: Major flow voids are preserved. Skull and upper cervical spine: Normal calvarium and skull base. Visualized upper cervical spine and soft tissues are normal. Sinuses/Orbits:No paranasal sinus fluid levels or advanced mucosal thickening. No mastoid or middle ear effusion. Normal orbits. MRI CERVICAL SPINE FINDINGS Alignment: Physiologic. Vertebrae: No fracture, evidence of discitis, or bone lesion. Cord: Normal signal and morphology. Posterior Fossa, vertebral arteries, paraspinal tissues: Negative. Disc levels: C1-2: Unremarkable. C2-3: Normal disc space and facet joints. There is no spinal canal stenosis. No neural foraminal stenosis. C3-4: Small central disc protrusion mild right facet arthrosis. There is no spinal canal stenosis. No neural foraminal stenosis. C4-5: Normal disc space and facet joints. There is no spinal canal stenosis. No neural foraminal stenosis. C5-6: Left foraminal disc protrusion with bilateral uncovertebral hypertrophy. There is no spinal canal stenosis. Severe left neural foraminal stenosis. C6-7: Normal disc space and facet joints. There is no spinal canal stenosis. No neural foraminal stenosis. C7-T1: Normal disc space and facet joints. There is no spinal canal stenosis. No neural foraminal stenosis. IMPRESSION: 1. Normal MRI of the brain. No metastatic disease to the cervical spine. 2. Severe left C5-6 neural  foraminal stenosis secondary to left foraminal disc protrusion and uncovertebral hypertrophy. Electronically Signed   By: Ulyses Jarred M.D.   On: 02/08/2022 19:23     Assessment and plan- Patient is a 31 y.o. female with stage IIIb triple negative breast cancer of the left breast cT2 N1 M0.  She is s/p  neoadjuvant chemotherapy as per keynote 522 protocol followed by complete pathological response.  She is s/p 4 cycles of adjuvant Keytruda complicated by grade 4 Keytruda induced hepatitis.  She is here for follow-up of following issues:  Keytruda induced hepatitis: Keytruda discontinued permanently.  LFTs are slowly trending down.  I did get in touch with Dr. Lavone Neri from [facility] who has now recommended to go up on the mycophenolate dose to 1250 twice daily and cutting down on the dose of prednisone to 25 mg daily.  So far she is tolerating mycophenolate without any significant side effects.  Will be repeating CBC with differential and CMP again in 2 weeks, 4 weeks in 6 weeks and I will see her back in 6 weeks.  Right hand weakness: Was evaluated by Dr. Mickeal Skinner from neurology as well as neurosurgery.  Etiology unclear and may be secondary to chemo-induced peripheral neuropathy.  She also has EMG study scheduled soon.  MRI brain was negative for malignancy or leptomeningeal disease.  Breast cancer surveillance: Patient is s/p bilateral mastectomy and therefore does not require any mammograms.  She also has triple negative disease and therefore there would be no role for endocrine therapy.  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   Visit Diagnosis 1. Encounter for follow-up surveillance of breast cancer   2. Autoimmune hepatitis ([place])      Dr. Randa Evens, MD, MPH [facility] [phone] 02/26/2022 6:49 PM

## 2022-03-02 ENCOUNTER — Encounter: Payer: Self-pay | Admitting: Internal Medicine

## 2022-03-02 ENCOUNTER — Encounter: Payer: Self-pay | Admitting: Oncology

## 2022-03-03 ENCOUNTER — Other Ambulatory Visit: Payer: Self-pay | Admitting: Oncology

## 2022-03-03 MED ORDER — OXYCODONE HCL 5 MG PO TABS: 5.0000 mg | ORAL_TABLET | Freq: Two times a day (BID) | ORAL | 0 refills | Status: DC

## 2022-03-03 NOTE — Telephone Encounter (Signed)
Needs refill for pain med

## 2022-03-05 LAB — HSV DNA BY PCR (REFERENCE LAB)
HSV 1 DNA: NEGATIVE
HSV 2 DNA: NEGATIVE

## 2022-03-06 ENCOUNTER — Encounter: Payer: Self-pay | Admitting: Oncology

## 2022-03-06 NOTE — Telephone Encounter (Signed)
[facility] on Monday if she still has swelling

## 2022-03-09 ENCOUNTER — Telehealth: Payer: Self-pay | Admitting: *Deleted

## 2022-03-09 ENCOUNTER — Inpatient Hospital Stay: Payer: 59

## 2022-03-09 ENCOUNTER — Inpatient Hospital Stay: Payer: 59 | Attending: Oncology

## 2022-03-09 DIAGNOSIS — Z452 Encounter for adjustment and management of vascular access device: Secondary | ICD-10-CM | POA: Insufficient documentation

## 2022-03-09 DIAGNOSIS — K754 Autoimmune hepatitis: Secondary | ICD-10-CM

## 2022-03-09 DIAGNOSIS — Z95828 Presence of other vascular implants and grafts: Secondary | ICD-10-CM

## 2022-03-09 DIAGNOSIS — C50812 Malignant neoplasm of overlapping sites of left female breast: Secondary | ICD-10-CM | POA: Insufficient documentation

## 2022-03-09 DIAGNOSIS — Z08 Encounter for follow-up examination after completed treatment for malignant neoplasm: Secondary | ICD-10-CM

## 2022-03-09 LAB — CBC WITH DIFFERENTIAL/PLATELET
Abs Immature Granulocytes: 0.11 10*3/uL — ABNORMAL HIGH (ref 0.00–0.07)
Basophils Absolute: 0 10*3/uL (ref 0.0–0.1)
Basophils Relative: 0 %
Eosinophils Absolute: 0 10*3/uL (ref 0.0–0.5)
Eosinophils Relative: 0 %
HCT: 33.6 % — ABNORMAL LOW (ref 36.0–46.0)
Hemoglobin: 10.8 g/dL — ABNORMAL LOW (ref 12.0–15.0)
Immature Granulocytes: 1 %
Lymphocytes Relative: 2 %
Lymphs Abs: 0.2 10*3/uL — ABNORMAL LOW (ref 0.7–4.0)
MCH: 33 pg (ref 26.0–34.0)
MCHC: 32.1 g/dL (ref 30.0–36.0)
MCV: 102.8 fL — ABNORMAL HIGH (ref 80.0–100.0)
Monocytes Absolute: 0.5 10*3/uL (ref 0.1–1.0)
Monocytes Relative: 6 %
Neutro Abs: 8 10*3/uL — ABNORMAL HIGH (ref 1.7–7.7)
Neutrophils Relative %: 91 %
Platelets: 139 10*3/uL — ABNORMAL LOW (ref 150–400)
RBC: 3.27 MIL/uL — ABNORMAL LOW (ref 3.87–5.11)
RDW: 16.7 % — ABNORMAL HIGH (ref 11.5–15.5)
WBC: 8.9 10*3/uL (ref 4.0–10.5)
nRBC: 0 % (ref 0.0–0.2)

## 2022-03-09 LAB — COMPREHENSIVE METABOLIC PANEL
ALT: 80 U/L — ABNORMAL HIGH (ref 0–44)
AST: 67 U/L — ABNORMAL HIGH (ref 15–41)
Albumin: 3.8 g/dL (ref 3.5–5.0)
Alkaline Phosphatase: 199 U/L — ABNORMAL HIGH (ref 38–126)
Anion gap: 6 (ref 5–15)
BUN: 11 mg/dL (ref 6–20)
CO2: 26 mmol/L (ref 22–32)
Calcium: 8.2 mg/dL — ABNORMAL LOW (ref 8.9–10.3)
Chloride: 103 mmol/L (ref 98–111)
Creatinine, Ser: 0.73 mg/dL (ref 0.44–1.00)
GFR, Estimated: >60 mL/min (ref >60)
Glucose, Bld: 175 mg/dL — ABNORMAL HIGH (ref 70–99)
Potassium: 3.9 mmol/L (ref 3.5–5.1)
Sodium: 135 mmol/L (ref 135–145)
Total Bilirubin: 1.2 mg/dL (ref 0.3–1.2)
Total Protein: 6.2 g/dL — ABNORMAL LOW (ref 6.5–8.1)

## 2022-03-09 MED ORDER — SODIUM CHLORIDE 0.9% FLUSH
10.0000 mL | Freq: Once | INTRAVENOUS | Status: AC
  Administered 2022-03-09: 10 mL via INTRAVENOUS
  Filled 2022-03-09: qty 10

## 2022-03-09 MED ORDER — HEPARIN SOD (PORK) LOCK FLUSH 100 UNIT/ML IV SOLN
500.0000 [IU] | Freq: Once | INTRAVENOUS | Status: AC
  Administered 2022-03-09: 500 [IU] via INTRAVENOUS
  Filled 2022-03-09: qty 5

## 2022-03-09 NOTE — Telephone Encounter (Signed)
Pt came today and gave me a [employer] form to help with finances while she is unable to go back to work. The form was filled out and sent to fax number on [employer] form. Also I sent a message about the labs today and sent to Dr. Merrilee Jansky- waiting for what he says to do with prednisone and the MMF

## 2022-03-10 ENCOUNTER — Telehealth: Payer: Self-pay | Admitting: *Deleted

## 2022-03-10 ENCOUNTER — Encounter: Payer: Self-pay | Admitting: *Deleted

## 2022-03-10 ENCOUNTER — Other Ambulatory Visit: Payer: Self-pay | Admitting: Oncology

## 2022-03-10 NOTE — Telephone Encounter (Signed)
I got the message from Dr. Merrilee Jansky and he said decrease the prednisone 20 mg and continue the MMF ( cellcept) at same dose 1250 mg twice a day. I have sent a my chart message with the changes. I also called her and told her the same thing and she is good. The next appt. Is June 19. Pt already knows about this

## 2022-03-11 NOTE — Therapy (Signed)
OUTPATIENT PHYSICAL THERAPY EVALUATION   Patient Name: Vickie Mathis MRN: [id number] DOB:1991/08/27, 31 y.o., female Today's Date: 03/16/2022   PT End of Session - 03/17/22 1035     Visit Number 1    Number of Visits 24    Authorization Type Self pay reporting period from 03/16/2022    Progress Note Due on Visit 10    PT Start Time 1345    PT Stop Time 1430    PT Time Calculation (min) 45 min    Activity Tolerance Patient tolerated treatment well    Behavior During Therapy [facility] for tasks assessed/performed             Past Medical History:  Diagnosis Date   Anxiety    Asthma    Breast cancer ([place]) 11/2020   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 AXILLARY NODE DISSECTION;  Surgeon: Herbert Pun, MD;  Location: [facility] ORS;  Service: General;  Laterality: Bilateral;  [place], 1.5 hours [place] 2.5 hours   BREAST BIOPSY Left 11/26/2020   vision 12:00 6cmfn [facility]   BREAST BIOPSY Left 11/26/2020   LN bx, hydro marker,  fragments of macrometastatic carcinoma   BREAST RECONSTRUCTION WITH PLACEMENT OF TISSUE EXPANDER AND FLEX HD (ACELLULAR HYDRATED DERMIS) Bilateral 06/25/2021   Procedure: BREAST RECONSTRUCTION WITH PLACEMENT OF TISSUE EXPANDER AND FLEX HD (ACELLULAR HYDRATED DERMIS);  Surgeon: Wallace Going, DO;  Location: [facility] OR[facility];  Service: Plastics;  Laterality: Bilateral;   PORTACATH PLACEMENT Right 12/13/2020   Procedure: INSERTION PORT-A-CATH;  Surgeon: Herbert Pun, MD;  Location: [facility] OR[facility];  Service: General;  Laterality: Right;   REMOVAL OF BILATERAL TISSUE EXPANDERS WITH PLACEMENT OF  BILATERAL BREAST IMPLANTS Bilateral 09/15/2021   Procedure: REMOVAL OF BILATERAL TISSUE EXPANDERS;  Surgeon: Wallace Going, DO;  Location: [facility];  Service: Plastics;  Laterality: Bilateral;   Patient Active Problem List   Diagnosis Date Noted   Chemotherapy-induced neuropathy ([place]) 02/06/2022   Right sided weakness 02/06/2022   Transaminitis 11/20/2021   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 10/25/2021   Malignant neoplasm of left breast in female, estrogen receptor positive ([place]) 06/25/2021   Genetic testing 01/15/2021   Family history of cancer    Invasive carcinoma of breast ([place]) 12/03/2020   Breast mass, left 07/01/2020   Situational mixed anxiety and depressive disorder 07/01/2020   Anxiety state 08/06/2014   Varicose veins of lower extremity 08/06/2014    PCP: Juluis Pitch, MD  REFERRING PROVIDER: Meade Maw, MD  REFERRING DIAG: right arm weakness, cervicalgia  THERAPY DIAG:  Cervicalgia - Plan: PT plan of care cert/re-cert  Pain in right arm - Plan: PT plan of care cert/re-cert  Pain in left arm - Plan: PT plan of care cert/re-cert  Paresthesia of skin - Plan: PT plan of care cert/re-cert  Muscle weakness (generalized) - Plan: PT plan of care cert/re-cert  Other lack of coordination - Plan: PT plan of care cert/re-cert  Rationale for Evaluation and Treatment: Rehabilitation  ONSET DATE: worsened 12/2021  SUBJECTIVE:  SUBJECTIVE STATEMENT: Patient states she was referred to PT after they were worried that her cancer has metastasized to the brain but the MRI showed this is not the case. A cervical spine MRI did show a problem with her neck, so she is here for her neck and arm problem. Both of her UE are affected with numbness  from elbow down and on the radial side of each hand/finger, L > R. She is left handed. She also has neck pain that has been present for years. She has noticed to have increased neck pain since she had a liver injury and started taking steroids. This was around March 2023.  She had symptoms of neuropathy prior to this, but her UE symptoms have increased at that time. She has been going to the chiropractor for her neck and getting adjustments and that is good. Her neck pain has gotten more present since march. She has pain in her B UT, R > L.  She does get headaches and has most of her life. She states they are currently no worse than usual. She may have pain in her upper thoracic spine or mid neck but not as much as over her upper traps. She does have pain down the outside of her right arm. She has not noticed any neuropathy symptoms in her feet. 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 She underwent bilateral mastectomy (06/2021). She has had chemo and radiation (finished 12/2021). She was going to have a reconstruction but her expanders were removed in December due to staph infection. She found she had a liver injury in 11/2021 due to an adverse reaction from [place]. She is being monitored by liver clinic at this point and her liver is improving. She denies previous problems in her arms from neck pain. Hands get weaker quickly and she drops things a lot more. She is currently tapering off prednisone and is on an immunosuppressant. She had been doing acupuncture where her neuropathy was not getting better or worse. Dr. Carney Living is her chiropractor. Dr. Izora Ribas recommended a nerve conduction study to help elucidate her condition. Patient reports increased kyphosis at CT junction and increased fullness of face was new onset with steroid treatment.   PERTINENT HISTORY:  Patient is a 32 y.o. female who presents to outpatient physical  therapy with a referral for medical diagnosis right arm weakness, cervicalgia. This patient's chief complaints consist of chronic neck pain, worsened in the last 3 months and B UE pain and paresthesia worse on the right and worse on the radial side of the hand leading to the following functional deficits: difficulty with activities requiring fine motor skills, anything requiring repetitive UE use, working at the post office, cooking, cutting, slicing, writing, art, crochet, drawing, painting, etc. Relevant past medical history and comorbidities include locally advanced (metastatized to lymph node)  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 sided breast cancer (dx 11/2020) s/p bilateral mastectomy (06/2021), history of chemotherapy and radiation therapy (concluded ~ 12/2021), recent liver injury from immuno therapy (immunotherapy discontinued, followed by hepatology), anxiety, asthma, verucose veins of B lower extremities with pain, depression, chemotherapy induced neuropathy, appendectomy, chronic steroid therapy.    PAIN:  Are you having pain? Yes: NPRS scale: Current: 7/10,  Best: 4-5/10, Worst: 9/10. Pain location: bilateral UT, R > L, pareshethisa in both hands and forearm radial side worse, R > L.  Pain description: achy, sharp, sudden. Paresthesia.  Aggravating factors: Tingling: if doesn't  take gabapentin, neck: right rotation and extension.  Relieving factors: UE: gabapentin. 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   PRECAUTIONS: None  WEIGHT BEARING RESTRICTIONS No  FALLS:  Has patient fallen in last 6 months? Yes. Number of falls 1 . [place] on a loose stone [place]. Does have some orthostatic hypotension  symptoms.   LIVING ENVIRONMENT: Lives with: lives with their spouse Lives in: House/apartment Stairs: 12-14 steps total, 5 steps no handrail the rest has handrails.  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   PLOF: Independent  LEISURE: art, painting, drawing, crochet, cooking, kitties, going to horse races.   PATIENT GOALS: "hopefully develop some strength and to keep hand at least from getting any worse to make my life a little easier despite what is happing or even reverse this"   OBJECTIVE  DIAGNOSTIC FINDINGS:  "MRI Brain and C spine 02/08/22 IMPRESSION:  1. Normal MRI of the brain. No metastatic disease to the cervical  spine.  2. 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  Anthropometrics Tremor: slight in B hands Body composition: edema noted at bilateral LE, all limbs with increased fullness which may be usual for patient or related to cancer dx/treatments.  Muscle bulk: no significant asymmetry noted.  Skin: scratches noted over parts of B UE (pt reports from excited dogs) Edema: B LE and fullness noted in B UE.  Functional Mobility Bed mobility: supine <> sit and rolling WFL Transfers: sit <> stand WFL Gait: grossly WFL for household and short community ambulation. More detailed gait analysis deferred to later date as needed.   NEUROLOGICAL Upper Motor Neuron Screen Hoffman's and Clonus (ankle) negative bilaterally.  Dermatomes C2-T1 appears equal and intact to light touch except the following: right C2 and left C6 with increased  sensitivity compared to contralateral side.  Deep Tendon Reflexes R/L  2+/2+ Biceps brachii reflex (C5, C6) 2+/2+ Triceps brachii reflex (C7) 2+/2+ Quadriceps reflex (L4) 2+/2+ Achilles reflex (S1)  SPINE MOTION CERVICAL SPINE AROM *Indicates pain Flexion: 55, decreases neck pain Extension: 60, increased neck pain, no worse Side Flexion:   R 35 increased R neck pain, increased R 5th finger tingling.   L 32 pain right neck Rotation:  R 64 increased R neck pain,  L 64 slight increase R neck pain (pulling) and left neck pain (achy)   PERIPHERAL JOINT MOTION (in degrees) ACTIVE RANGE OF MOTION (AROM) B UE appears grossly WFL except some mild restriction in overhead motion at the shoulders related to pulling at the chest where mastectomy was performed.   MUSCLE PERFORMANCE (MMT):  *Indicates pain 03/16/22 Date Date  Joint/Motion R/L R/L R/L  Shoulder     Flexion 4+/4+ / /  Abduction (C5) 4+/4+ / /  External rotation 4-/4- / /  Internal rotation 4+/4+ / /  Elbow     Flexion (C6) 4/4 / /  Extension (C7) 5/5 / /  Hand     Thumb extension (C8) 4/4 / /  Finger abduction (T1) 4/4 / /  Comments:  03/16/2022: grip strength grossly tested and appears to have moderate strength for up to 4 seconds. Patient with discomfort with shoulder IR and elbow flexion due to pulling at chest where mastectomy was performed.   COORDINATION Finger to thumb tip to tip touch as fast as possible: noted for decreased speed and visual compensation bilateral sides.   SPECIAL TESTS: CERVICAL SPINE Cervical spine axial compression: left neck pain Spurling's part B:  R = positive for R neck pain, L = positive for left neck pain Cervical spine axial distraction: negative for UE    ACCESSORY MOTION: Hypomobility and localized pain with CPA over cervical spine and mid to upper thoracic spine. No effect on UE symptoms.   PALPATION: TTP and tight in bilateral UT    TODAY'S TREATMENT:   education   PATIENT EDUCATION:  Education details: Education on diagnosis, prognosis, POC, anatomy and physiology of current condition.  Person educated: Patient and father Richardson Landry Education method: Explanation Education comprehension: verbalized understanding and needs further education   HOME EXERCISE PROGRAM: TBD  ASSESSMENT:  CLINICAL IMPRESSION:   Patient is a 31 y.o. female referred to outpatient physical therapy with a medical diagnosis of right arm weakness, cervicalgia who presents with signs and symptoms consistent with chronic neck pain and B LE pain and paresthesia, weakness, and impaired fine motor control. Unable to establish clear connection between neck pain and UE symptoms today with objective testing. Symptoms appear worse along radial side of hand/forearm R > L and plan to continue examination next session for signs of peripheral neural entrapment. Plan to address both neck and arm regions as appropriate in upcoming sessions. Patient would benefit from seeing OT for lymphedema assessment and education. Patient presents with significant pain, paresthesia, motor control, muscle tension, postural, muscle performance (strength/power/endurance) and activity tolerance  impairments that are limiting ability to complete her usual activities including activities requiring fine motor skills, anything requiring repetitive UE use, working at the post office, cooking, cutting, slicing, writing, art, crochet, drawing, painting, etc, without difficulty. Patient will benefit from skilled physical therapy intervention to address current body structure impairments and activity limitations to improve function and work towards goals set in current POC in order to return to prior level of function or maximal functional improvement.   OBJECTIVE IMPAIRMENTS decreased activity tolerance, decreased coordination, decreased endurance, decreased knowledge of condition, decreased strength, increased edema,  impaired perceived functional ability, increased muscle spasms, impaired flexibility, impaired sensation, impaired tone, impaired UE functional use, improper body mechanics, postural dysfunction, and pain.   ACTIVITY LIMITATIONS carrying, lifting, and reach over head  PARTICIPATION LIMITATIONS: meal prep, cleaning, medication management, interpersonal relationship, community activity, occupation, and   activities requiring fine motor skills, anything requiring repetitive UE use, working at the post office, cooking, cutting, slicing, writing, art, crochet, drawing, painting, etc.  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   REHAB POTENTIAL: Good  CLINICAL DECISION MAKING: Evolving/moderate complexity  EVALUATION COMPLEXITY: Moderate   GOALS: Goals reviewed with patient? No  SHORT TERM GOALS: Target date: 03/31/2022  Patient will be independent with initial home exercise program for self-management of symptoms. Baseline: Initial HEP to be provided at visit 2 as appropriate (03/16/22); Goal status: INITIAL   LONG TERM GOALS: Target date: 06/08/2022  Patient will be independent with a long-term home exercise program for self-management of symptoms.  Baseline: Initial HEP to be provided at visit 2 as appropriate (03/16/22); Goal status: INITIAL  2.  Patient will demonstrate improved FOTO to equal or greater than 66 by visit  #11 to demonstrate improvement in overall condition and self-reported functional ability.  Baseline: 51 (03/16/22); Goal status: INITIAL  3.  Patient will demonstrate full cervical spine AROM with no increase in pain except temporary end range discomfort to improve her ability to view her surroundings and complete her work with less difficulty.  Baseline: end range pain - see objective exam,  (03/16/22); Goal status: INITIAL  4.  Patient will demonstrate B UE strength equal or greater than 5/5 in all motions with no increase in pain to demonstrate improved ability to use B UE for functional activities.  Baseline: decreased strength - see objective exam (03/16/22); Goal status: INITIAL  5.  Patient will complete community, work and/or recreational activities without limitation due to current condition.  Baseline: activities requiring fine motor skills, anything requiring repetitive UE use, working at the post office, cooking, cutting, slicing, writing, art, crochet, drawing, painting, etc (03/16/22); Goal status: INITIAL  6.  Patient will demonstrate each finger to thumb tip to tip touch without hesitation with eyes closed to demonstrate improved fine motor ability in hands.  Baseline: decreased speed and dependent on visual compensation (03/16/2022);  Goal status: INITIAL    PLAN: PT FREQUENCY: 1-2x/week  PT DURATION: 12 weeks  PLANNED INTERVENTIONS: Therapeutic exercises, Therapeutic activity, Neuromuscular re-education, Balance training, Patient/Family education, Joint mobilization, Aquatic Therapy, Dry Needling, Electrical stimulation, Spinal mobilization, Cryotherapy, Moist heat, Manual therapy, and Re-evaluation  PLAN FOR NEXT SESSION: update HEP as appropriate, manual therapy, postural strengthening, UE strengthening, nerve gliding, fine motor tasks as appropriate.    Everlean Alstrom. Graylon Good, PT, DPT 03/17/22, 10:36 AM  [facility] [address] [place], [place] [address] P: [phone] I F: [phone]

## 2022-03-12 ENCOUNTER — Encounter: Payer: Self-pay | Admitting: Internal Medicine

## 2022-03-12 ENCOUNTER — Other Ambulatory Visit: Payer: Self-pay | Admitting: Oncology

## 2022-03-12 ENCOUNTER — Encounter: Payer: Self-pay | Admitting: Oncology

## 2022-03-13 ENCOUNTER — Encounter: Payer: Self-pay | Admitting: Oncology

## 2022-03-13 ENCOUNTER — Encounter: Payer: Self-pay | Admitting: Internal Medicine

## 2022-03-16 ENCOUNTER — Ambulatory Visit: Payer: Self-pay | Attending: Neurosurgery | Admitting: Physical Therapy

## 2022-03-16 ENCOUNTER — Encounter: Payer: Self-pay | Admitting: Physical Therapy

## 2022-03-16 DIAGNOSIS — I972 Postmastectomy lymphedema syndrome: Secondary | ICD-10-CM | POA: Insufficient documentation

## 2022-03-16 DIAGNOSIS — M6281 Muscle weakness (generalized): Secondary | ICD-10-CM | POA: Insufficient documentation

## 2022-03-16 DIAGNOSIS — R202 Paresthesia of skin: Secondary | ICD-10-CM | POA: Insufficient documentation

## 2022-03-16 DIAGNOSIS — M542 Cervicalgia: Secondary | ICD-10-CM | POA: Insufficient documentation

## 2022-03-16 DIAGNOSIS — M79601 Pain in right arm: Secondary | ICD-10-CM | POA: Insufficient documentation

## 2022-03-16 DIAGNOSIS — M79602 Pain in left arm: Secondary | ICD-10-CM | POA: Insufficient documentation

## 2022-03-16 DIAGNOSIS — R278 Other lack of coordination: Secondary | ICD-10-CM | POA: Insufficient documentation

## 2022-03-17 ENCOUNTER — Telehealth: Payer: Self-pay | Admitting: *Deleted

## 2022-03-17 NOTE — Telephone Encounter (Signed)
Received on (03/16/22) via of fax DME Standard Written Order from [facility] [place].  Requesting signature and return.  Given to provider to sign and return.  DME Standard Written Order signed and faxed back to [facility] [place].  Confirmation received and copy scanned into the chart.//AB/CMA

## 2022-03-18 ENCOUNTER — Encounter: Payer: Self-pay | Admitting: Oncology

## 2022-03-18 ENCOUNTER — Encounter: Payer: Self-pay | Admitting: Internal Medicine

## 2022-03-18 ENCOUNTER — Ambulatory Visit: Payer: Self-pay | Admitting: Physical Therapy

## 2022-03-18 ENCOUNTER — Ambulatory Visit: Payer: Self-pay | Admitting: Occupational Therapy

## 2022-03-18 ENCOUNTER — Encounter: Payer: Self-pay | Admitting: Physical Therapy

## 2022-03-18 DIAGNOSIS — M542 Cervicalgia: Secondary | ICD-10-CM

## 2022-03-18 DIAGNOSIS — R202 Paresthesia of skin: Secondary | ICD-10-CM

## 2022-03-18 DIAGNOSIS — M79601 Pain in right arm: Secondary | ICD-10-CM

## 2022-03-18 DIAGNOSIS — M79602 Pain in left arm: Secondary | ICD-10-CM

## 2022-03-18 DIAGNOSIS — I972 Postmastectomy lymphedema syndrome: Secondary | ICD-10-CM

## 2022-03-18 DIAGNOSIS — R278 Other lack of coordination: Secondary | ICD-10-CM

## 2022-03-18 DIAGNOSIS — M6281 Muscle weakness (generalized): Secondary | ICD-10-CM

## 2022-03-18 NOTE — Therapy (Signed)
[facility] 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 AXILLARY NODE DISSECTION;  Surgeon: Herbert Pun, MD;  Location: [facility];  Service: General;  Laterality: Bilateral;  Dillingham, 1.5 hours Cintron-Diaz 2.5 hours   BREAST BIOPSY Left 11/26/2020   vision 12:00 6cmfn [facility]   BREAST BIOPSY Left 11/26/2020   LN bx, hydro marker,  fragments of macrometastatic carcinoma   BREAST RECONSTRUCTION WITH PLACEMENT OF TISSUE EXPANDER AND FLEX HD (ACELLULAR HYDRATED DERMIS) Bilateral 06/25/2021   Procedure: BREAST RECONSTRUCTION WITH PLACEMENT OF TISSUE EXPANDER AND FLEX HD (ACELLULAR HYDRATED DERMIS);  Surgeon: Wallace Going, DO;  Location: [facility];  Service: Plastics;  Laterality: Bilateral;   PORTACATH PLACEMENT Right 12/13/2020   Procedure: INSERTION PORT-A-CATH;  Surgeon: Herbert Pun, MD;  Location: [facility];  Service: General;  Laterality: Right;   REMOVAL OF BILATERAL TISSUE EXPANDERS WITH PLACEMENT OF BILATERAL BREAST IMPLANTS Bilateral 09/15/2021   Procedure: REMOVAL OF BILATERAL TISSUE EXPANDERS;  Surgeon: Wallace Going, DO;  Location:  [facility];  Service: Plastics;  Laterality: Bilateral;    There were no vitals filed for this visit.   Subjective Assessment - 03/18/22 1531     Subjective  I noticed some swelling under my left arm about 2 weeks ago , it feels like it is staying about the same now - not going down and into my chest may be that pocket a little bit larter than it use to be  It just feels tight little bit full.    Currently in Pain? No/denies                 LYMPHEDEMA/ONCOLOGY QUESTIONNAIRE - 03/18/22 0001       Right Upper Extremity Lymphedema   15 cm Proximal to Olecranon Process 36 cm    10 cm Proximal to Olecranon Process 32.8 cm    Olecranon Process 30.4 cm    15 cm Proximal to Ulnar Styloid Process 30.7 cm    10 cm Proximal to Ulnar Styloid Process 26.4 cm    Just Proximal to Ulnar Styloid Process 19.2 cm      Left Upper Extremity Lymphedema   15 cm Proximal to Olecranon Process 36.8 cm    10 cm Proximal to Olecranon Process 34.4 cm    Olecranon Process 30.4 cm    15 cm Proximal to Ulnar Styloid Process 30.2 cm    10 cm Proximal to Ulnar Styloid Process 26.6 cm    Just Proximal to Ulnar Styloid Process 18.4 cm  PT CLINICAL SUMMARY 03/17/22: Vickie Mathis Patient is a 31 y.o. female referred to outpatient physical therapy with a medical diagnosis of right arm weakness, cervicalgia who presents with signs and symptoms consistent with chronic neck pain and B LE pain and paresthesia, weakness, and impaired fine motor control. Unable to establish clear connection between neck pain and UE symptoms today with objective testing. Symptoms appear worse along radial side of hand/forearm R > L and plan to continue examination next session for signs of peripheral neural entrapment. Plan to address both neck and arm regions as appropriate in upcoming sessions. Patient would benefit from seeing OT for lymphedema assessment and education. Patient presents with  significant pain, paresthesia, motor control, muscle tension, postural, muscle performance (strength/power/endurance) and activity tolerance  impairments that are limiting ability to complete her usual activities including activities requiring fine motor skills, anything requiring repetitive UE use, working at the post office, cooking, cutting, slicing, writing, art, crochet, drawing, painting, etc, without difficulty. Patient will benefit from skilled physical therapy intervention to address current body structure impairments and activity limitations to improve function and work towards goals set in current POC in order to return to prior level of function or maximal functional improvement   OT SCREEN 03/18/22: Patient had double mastectomy on 9/22.  With expanders put in by Dr. Marla Roe.  Patient developed infection and on 12/22 expanders was removed.  Patient reports 2 lymph nodes was removed sittings.  She did had radiation.  That ended in March 23. It appears patient noticed around 2 weeks ago swelling on the left side on thoracic and chest wall.  It did subside.  Per patient it staying about the same now and somewhat into the chest wall to.  Did measure bilateral circumference of upper extremity compared to the left to the right.  Patient is right-handed, but left arm appears increase in the upper arm a 1.6 cm.  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  Would recommend at this stage for patient to unilateral postmastectomy Jovi pack breast pad to wear under light compression for 2 weeks.  Patient to follow-up with me in 2 weeks again. Patient report during PT increased neuropathy in bilateral hands as well as some numbness in the right fourth and fifth digit.  Do appear patient had a positive Tinel with tenderness in the right cubital tunnel.  Reviewed with patient positioning of elbow during sleeping as well as sitting during  the day.  Patient to avoid cradling right hand on chest as well as propping up.  Also avoid sleeping in the fetal position.  Patient report numbness is worse in the morning.  Recommended for her maybe to get volleyball pads that works great with cubital tunnel to wear padding over the cubital tunnel during the day and at nighttime on the volar side of the elbow to prevent prevent flexion. Patient can try to modify a little bit her ADLs by not over gripping objects open forearm pain, enlarging grips, using larger joints or palms to carry and lift things.                         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, [place], [address] Phone: [phone]   Fax:  [phone]  Name: Vickie Mathis MRN: [id number] Date of Birth: [birth year]

## 2022-03-18 NOTE — Therapy (Addendum)
OUTPATIENT PHYSICAL THERAPY TREATMENT NOTE   Patient Name: Vickie Mathis MRN: [id number] DOB:1991-09-26, 31 y.o., female Today's Date: 03/18/2022  PCP: Juluis Pitch, MD REFERRING PROVIDER: Meade Maw, MD  END OF SESSION:   PT End of Session - 03/18/22 1510     Visit Number 2    Number of Visits 24    Date for PT Re-Evaluation 06/08/22    Authorization Type Self pay reporting period from 03/16/2022    Progress Note Due on Visit 10    PT Start Time 1525    PT Stop Time 1615    PT Time Calculation (min) 50 min    Activity Tolerance Patient tolerated treatment well    Behavior During Therapy [facility] for tasks assessed/performed             Past Medical History:  Diagnosis Date   Anxiety    Asthma    Breast cancer ([place]) 11/2020   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 AXILLARY NODE DISSECTION;  Surgeon: Herbert Pun, MD;  Location: [facility];  Service: General;  Laterality: Bilateral;  [place], 1.5 hours Cintron-Diaz 2.5 hours   BREAST BIOPSY Left 11/26/2020   vision 12:00 6cmfn [facility]   BREAST BIOPSY Left 11/26/2020   LN bx, hydro marker,  fragments of macrometastatic carcinoma   BREAST RECONSTRUCTION WITH PLACEMENT OF TISSUE EXPANDER AND FLEX HD (ACELLULAR HYDRATED DERMIS) Bilateral 06/25/2021   Procedure: BREAST RECONSTRUCTION WITH PLACEMENT OF TISSUE EXPANDER AND FLEX HD (ACELLULAR HYDRATED DERMIS);  Surgeon: Wallace Going, DO;  Location: [facility];  Service: Plastics;  Laterality: Bilateral;   PORTACATH PLACEMENT Right 12/13/2020   Procedure: INSERTION PORT-A-CATH;  Surgeon: Herbert Pun, MD;  Location: [facility];  Service: General;  Laterality: Right;   REMOVAL OF BILATERAL TISSUE EXPANDERS WITH PLACEMENT OF BILATERAL BREAST IMPLANTS Bilateral 09/15/2021   Procedure: REMOVAL OF BILATERAL TISSUE EXPANDERS;  Surgeon: Wallace Going, DO;  Location: [facility];  Service: Plastics;  Laterality: Bilateral;   Patient Active Problem List   Diagnosis Date Noted   Chemotherapy-induced neuropathy ([place]) 02/06/2022   Right sided weakness 02/06/2022   Transaminitis 11/20/2021   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 10/25/2021   Malignant neoplasm of left breast in female, estrogen receptor positive ([place]) 06/25/2021   Genetic testing 01/15/2021   Family history of cancer    Invasive carcinoma of breast ([place]) 12/03/2020   Breast mass, left 07/01/2020   Situational mixed anxiety and depressive disorder 07/01/2020   Anxiety state 08/06/2014   Varicose veins of lower extremity 08/06/2014    REFERRING DIAG: right arm weakness, cervicalgia  THERAPY DIAG:  Cervicalgia  Pain in right arm  Pain in left arm  Paresthesia of skin  Muscle weakness (generalized)  Other lack of coordination  Rationale for Evaluation and Treatment: Rehabilitation  ONSET DATE: worsened 12/2021  PERTINENT HISTORY: Patient is a 31 y.o. female who presents to outpatient physical therapy with a referral for medical diagnosis right arm weakness, cervicalgia. This patient's chief complaints consist of chronic neck pain, worsened in the last 3 months and B UE pain and paresthesia worse on  the right and worse on the radial side of the hand leading to the following functional deficits: difficulty with activities requiring fine motor skills, anything requiring repetitive UE use, working at the post office, cooking, cutting, slicing, writing, art, crochet, drawing, painting, etc. 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    PRECAUTIONS: none  SUBJECTIVE: Patient states she is feeling a little hung over today after she fell on her right forearm after she got drunk when her parents left. She thinks she fell last and probably slept in a funny position that made her neck hurt more. Her right radial forearm and hand is bruised and hurting a lot as well after falling on it.   PAIN:  Are you having pain? 6-7/10 right side of the neck.   OBJECTIVE:   SPECIAL TESTS  Tinels at cubital tunnel: negative bilaterally Elbow flexion test: 2 min negative bilaterally Alar ligament: negative in extension, flexion, and neutral  UPPER LIMB NEURODYNAMIC TESTS Upper Limb Tension Test 1 (ULTT1, Median nerve bias, Magee-ULTT1):  R = positive  L  = positive  Upper Limb Tension Test 2B (ULTT2B, Radial nerve bias, Magee-ULTT3):  R  = negative  L = positive  Upper Limb Tension Test 3 (ULTT3, Ulnar nerve bias, Magee-ULTT4):  R = positive  L = positive  TODAY'S TREATMENT:  Manual therapy: to reduce pain and tissue tension, improve range of motion, neuromodulation, in order to promote improved ability to complete functional activities. HOOKLYING - STM to posterior cervical spine musculature focusing on B UT, cervical paraspinals, and suboccipitals. STM to B SCM - intermittent gentle cervical distraction - alar ligament test (negative) - upper limb neurodynamic testing  Therapeutic exercise: to centralize symptoms and improve ROM, strength, muscular endurance, and activity tolerance required for successful completion of functional  activities.  - hooklying chin tuck for deep neck flexors, 1x10 with up to 5 second holds.  - seated medial and ulnar nerve floss, 1x10 each side for each nerve as a slider plus 1x10 tensioner on each side for median nerve.  - standing scapular row, 1x10 with GTB, 1x10 with RTB (difficulty with motor control) - seated sensory discrimination task with one had at a time reaching into a box and attempting to identify small objects without being able to see them to improve stereognosis through touch.  - Education on HEP including handout   Pt required multimodal cuing for proper technique and to facilitate improved neuromuscular control, strength, range of motion, and functional ability resulting in improved performance and form.   PATIENT EDUCATION:  Education details: Education on diagnosis, prognosis, POC, anatomy and physiology of current condition.  Person educated: Patient and father Richardson Landry Education method: Explanation Education comprehension: verbalized understanding and needs further education     HOME EXERCISE PROGRAM: Access Code: [email or web address] URL: [email or web address] Date: 03/18/2022 Prepared by: Rosita Kea  Exercises - Ulnar Nerve Flossing  - 2 x daily - 1 sets - 15 reps - Median Nerve Flossing - Tray  - 2 x daily - 1 sets - 15 reps - Row with band/cable  - 1 x daily - 3 sets - 10 reps - 2 seconds hold - Supine  Head Nod Deep Neck Flexor Training  - 1 x daily - 2 sets - 10 reps - 5 hold   ASSESSMENT:   CLINICAL IMPRESSION:  Patient reported increased paresthesia at B UE and some neck discomfort at end of session. She reported manual therapy at the neck felt good during its application. Patient had difficulty with motor control with exercises and would benefit from further cuing and postural strengthening. Unable to further elucidate source of UE symptoms. Patient would benefit from continued management of limiting condition by skilled physical therapist to address  remaining impairments and functional limitations to work towards stated goals and return to PLOF or maximal functional independence.   Patient is a 31 y.o. female referred to outpatient physical therapy with a medical diagnosis of right arm weakness, cervicalgia who presents with signs and symptoms consistent with chronic neck pain and B LE pain and paresthesia, weakness, and impaired fine motor control. Unable to establish clear connection between neck pain and UE symptoms today with objective testing. Symptoms appear worse along radial side of hand/forearm R > L and plan to continue examination next session for signs of peripheral neural entrapment. Plan to address both neck and arm regions as appropriate in upcoming sessions. Patient would benefit from seeing OT for lymphedema assessment and education. Patient presents with significant pain, paresthesia, motor control, muscle tension, postural, muscle performance (strength/power/endurance) and activity tolerance  impairments that are limiting ability to complete her usual activities including activities requiring fine motor skills, anything requiring repetitive UE use, working at the post office, cooking, cutting, slicing, writing, art, crochet, drawing, painting, etc, without difficulty. Patient will benefit from skilled physical therapy intervention to address current body structure impairments and activity limitations to improve function and work towards goals set in current POC in order to return to prior level of function or maximal functional improvement.    OBJECTIVE IMPAIRMENTS decreased activity tolerance, decreased coordination, decreased endurance, decreased knowledge of condition, decreased strength, increased edema, impaired perceived functional ability, increased muscle spasms, impaired flexibility, impaired sensation, impaired tone, impaired UE functional use, improper body mechanics, postural dysfunction, and pain.    ACTIVITY LIMITATIONS  carrying, lifting, and reach over head   PARTICIPATION LIMITATIONS: meal prep, cleaning, medication management, interpersonal relationship, community activity, occupation, and   activities requiring fine motor skills, anything requiring repetitive UE use, working at the post office, cooking, cutting, slicing, writing, art, crochet, drawing, painting, etc.   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    REHAB POTENTIAL: Good   CLINICAL DECISION MAKING: Evolving/moderate complexity   EVALUATION COMPLEXITY: Moderate     GOALS: Goals reviewed with patient? No   SHORT TERM GOALS: Target date: 03/31/2022   Patient will be independent with initial home exercise program for self-management of symptoms. Baseline: Initial HEP to be provided at visit 2 as appropriate (03/16/22); Goal status: In-progress     LONG TERM GOALS: Target date: 06/08/2022   Patient will be independent with a long-term home exercise program for self-management of symptoms.  Baseline: Initial HEP to be provided at visit 2 as appropriate (03/16/22);  Goal status: In-progress   2.  Patient will demonstrate improved FOTO to equal or greater than 66 by visit #11 to demonstrate improvement in overall condition and self-reported functional ability.  Baseline: 51 (03/16/22); Goal status: In-progress   3.  Patient will demonstrate full cervical spine AROM with no  increase in pain except temporary end range discomfort to improve her ability to view her surroundings and complete her work with less difficulty.  Baseline: end range pain - see objective exam,  (03/16/22); Goal status: In-progress   4.  Patient will demonstrate B UE strength equal or greater than 5/5 in all motions with no increase in pain to demonstrate improved ability to use B UE for functional activities.  Baseline: decreased strength - see objective exam (03/16/22); Goal status: In-progress   5.  Patient will complete community, work and/or recreational activities without limitation due to current condition.  Baseline: activities requiring fine motor skills, anything requiring repetitive UE use, working at the post office, cooking, cutting, slicing, writing, art, crochet, drawing, painting, etc (03/16/22); Goal status: In-progress   6.  Patient will demonstrate each finger to thumb tip to tip touch without hesitation with eyes closed to demonstrate improved fine motor ability in hands.  Baseline: decreased speed and dependent on visual compensation (03/16/2022);  Goal status: In-progress       PLAN: PT FREQUENCY: 1-2x/week   PT DURATION: 12 weeks   PLANNED INTERVENTIONS: Therapeutic exercises, Therapeutic activity, Neuromuscular re-education, Balance training, Patient/Family education, Joint mobilization, Aquatic Therapy, Dry Needling, Electrical stimulation, Spinal mobilization, Cryotherapy, Moist heat, Manual therapy, and Re-evaluation   PLAN FOR NEXT SESSION: update HEP as appropriate, manual therapy, postural strengthening, UE strengthening, nerve gliding, fine motor tasks as appropriate.      Everlean Alstrom. Graylon Good, PT, DPT 03/18/22, 5:30 PM  [facility] [address] [place],  [address] P: [phone] I F: [phone]

## 2022-03-23 ENCOUNTER — Inpatient Hospital Stay: Payer: 59

## 2022-03-23 ENCOUNTER — Telehealth: Payer: Self-pay

## 2022-03-23 ENCOUNTER — Encounter: Payer: Self-pay | Admitting: Physical Therapy

## 2022-03-23 ENCOUNTER — Ambulatory Visit: Payer: Self-pay | Admitting: Physical Therapy

## 2022-03-23 DIAGNOSIS — Z08 Encounter for follow-up examination after completed treatment for malignant neoplasm: Secondary | ICD-10-CM

## 2022-03-23 DIAGNOSIS — M6281 Muscle weakness (generalized): Secondary | ICD-10-CM

## 2022-03-23 DIAGNOSIS — K754 Autoimmune hepatitis: Secondary | ICD-10-CM

## 2022-03-23 DIAGNOSIS — R202 Paresthesia of skin: Secondary | ICD-10-CM

## 2022-03-23 DIAGNOSIS — Z452 Encounter for adjustment and management of vascular access device: Secondary | ICD-10-CM | POA: Diagnosis not present

## 2022-03-23 DIAGNOSIS — M79602 Pain in left arm: Secondary | ICD-10-CM

## 2022-03-23 DIAGNOSIS — R278 Other lack of coordination: Secondary | ICD-10-CM

## 2022-03-23 DIAGNOSIS — M542 Cervicalgia: Secondary | ICD-10-CM

## 2022-03-23 DIAGNOSIS — M79601 Pain in right arm: Secondary | ICD-10-CM

## 2022-03-23 LAB — CBC WITH DIFFERENTIAL/PLATELET
Abs Immature Granulocytes: 0.16 10*3/uL — ABNORMAL HIGH (ref 0.00–0.07)
Basophils Absolute: 0 10*3/uL (ref 0.0–0.1)
Basophils Relative: 0 %
Eosinophils Absolute: 0 10*3/uL (ref 0.0–0.5)
Eosinophils Relative: 0 %
HCT: 36.6 % (ref 36.0–46.0)
Hemoglobin: 12 g/dL (ref 12.0–15.0)
Immature Granulocytes: 1 %
Lymphocytes Relative: 3 %
Lymphs Abs: 0.4 10*3/uL — ABNORMAL LOW (ref 0.7–4.0)
MCH: 32.9 pg (ref 26.0–34.0)
MCHC: 32.8 g/dL (ref 30.0–36.0)
MCV: 100.3 fL — ABNORMAL HIGH (ref 80.0–100.0)
Monocytes Absolute: 0.6 10*3/uL (ref 0.1–1.0)
Monocytes Relative: 5 %
Neutro Abs: 11.3 10*3/uL — ABNORMAL HIGH (ref 1.7–7.7)
Neutrophils Relative %: 91 %
Platelets: 199 10*3/uL (ref 150–400)
RBC: 3.65 MIL/uL — ABNORMAL LOW (ref 3.87–5.11)
RDW: 15.3 % (ref 11.5–15.5)
WBC: 12.5 10*3/uL — ABNORMAL HIGH (ref 4.0–10.5)
nRBC: 0 % (ref 0.0–0.2)

## 2022-03-23 LAB — COMPREHENSIVE METABOLIC PANEL
ALT: 75 U/L — ABNORMAL HIGH (ref 0–44)
AST: 75 U/L — ABNORMAL HIGH (ref 15–41)
Albumin: 3.9 g/dL (ref 3.5–5.0)
Alkaline Phosphatase: 184 U/L — ABNORMAL HIGH (ref 38–126)
Anion gap: 9 (ref 5–15)
BUN: 12 mg/dL (ref 6–20)
CO2: 25 mmol/L (ref 22–32)
Calcium: 9 mg/dL (ref 8.9–10.3)
Chloride: 103 mmol/L (ref 98–111)
Creatinine, Ser: 0.77 mg/dL (ref 0.44–1.00)
GFR, Estimated: >60 mL/min (ref >60)
Glucose, Bld: 149 mg/dL — ABNORMAL HIGH (ref 70–99)
Potassium: 3.9 mmol/L (ref 3.5–5.1)
Sodium: 137 mmol/L (ref 135–145)
Total Bilirubin: 1.2 mg/dL (ref 0.3–1.2)
Total Protein: 6.7 g/dL (ref 6.5–8.1)

## 2022-03-23 NOTE — Telephone Encounter (Signed)
-----   Message from Sindy Guadeloupe, MD sent at 03/23/2022  3:45 PM EDT ----- Please tell patient to lower the dose of prednisone to 15 mg.  Keep follow-up labs and MD appointment on 04/06/2022 as scheduled

## 2022-03-23 NOTE — Therapy (Signed)
OUTPATIENT PHYSICAL THERAPY TREATMENT NOTE   Patient Name: Vickie Mathis MRN: [id number] DOB:Feb 07, 1991, 31 y.o., female Today's Date: 03/23/2022  PCP: Juluis Pitch, MD REFERRING PROVIDER: Meade Maw, MD  END OF SESSION:   PT End of Session - 03/23/22 1839     Visit Number 3    Number of Visits 24    Date for PT Re-Evaluation 06/08/22    Authorization Type Self pay reporting period from 03/16/2022    Progress Note Due on Visit 10    PT Start Time 1737    PT Stop Time 1820    PT Time Calculation (min) 43 min    Activity Tolerance Patient tolerated treatment well    Behavior During Therapy [facility] for tasks assessed/performed              Past Medical History:  Diagnosis Date   Anxiety    Asthma    Breast cancer ([place]) 11/2020   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 AXILLARY NODE DISSECTION;  Surgeon: Herbert Pun, MD;  Location: [facility] ORS;  Service: General;  Laterality: Bilateral;  [place], 1.5 hours [place] 2.5 hours   BREAST BIOPSY Left 11/26/2020   vision 12:00 6cmfn [facility]   BREAST BIOPSY Left 11/26/2020   LN bx, hydro marker,  fragments of macrometastatic carcinoma   BREAST RECONSTRUCTION WITH PLACEMENT OF TISSUE EXPANDER AND FLEX HD (ACELLULAR HYDRATED DERMIS) Bilateral 06/25/2021   Procedure: BREAST RECONSTRUCTION WITH PLACEMENT OF TISSUE EXPANDER AND FLEX HD (ACELLULAR HYDRATED DERMIS);  Surgeon: Wallace Going, DO;  Location: [facility] OR[facility];  Service: Plastics;  Laterality: Bilateral;   PORTACATH PLACEMENT Right 12/13/2020   Procedure: INSERTION PORT-A-CATH;  Surgeon: Herbert Pun, MD;  Location: [facility];  Service: General;  Laterality: Right;   REMOVAL OF BILATERAL TISSUE EXPANDERS WITH PLACEMENT OF BILATERAL BREAST IMPLANTS Bilateral 09/15/2021   Procedure: REMOVAL OF BILATERAL TISSUE EXPANDERS;  Surgeon: Wallace Going, DO;  Location: [facility];  Service: Plastics;  Laterality: Bilateral;   Patient Active Problem List   Diagnosis Date Noted   Chemotherapy-induced neuropathy ([place]) 02/06/2022   Right sided weakness 02/06/2022   Transaminitis 11/20/2021   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 10/25/2021   Malignant neoplasm of left breast in female, estrogen receptor positive ([place]) 06/25/2021   Genetic testing 01/15/2021   Family history of cancer    Invasive carcinoma of breast ([place]) 12/03/2020   Breast mass, left 07/01/2020   Situational mixed anxiety and depressive disorder 07/01/2020   Anxiety state 08/06/2014   Varicose veins of lower extremity 08/06/2014    REFERRING DIAG: right arm weakness, cervicalgia  THERAPY DIAG:  Cervicalgia  Pain in right arm  Pain in left arm  Paresthesia of skin  Muscle weakness (generalized)  Other lack of coordination  Rationale for Evaluation and Treatment: Rehabilitation  ONSET DATE: worsened 12/2021  PERTINENT HISTORY: Patient is a 31 y.o. female who presents to outpatient physical therapy with a referral for medical diagnosis right arm weakness, cervicalgia. This patient's chief complaints consist of chronic neck pain, worsened in the last 3 months and B UE pain and paresthesia worse  on the right and worse on the radial side of the hand leading to the following functional deficits: difficulty with activities requiring fine motor skills, anything requiring repetitive UE use, working at the post office, cooking, cutting, slicing, writing, art, crochet, drawing, painting, etc. 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    PRECAUTIONS: none  SUBJECTIVE: Patient states she is having pain at her neck, R > L upon arrival. Patient states she took some oxycodone today because of her neck pain. She is wearing the Jobe pack like what the OT suggested. She was sore after last PT session but she has also had chiropractic visit that may have affected her soreness. The soreness resolved pretty quick. The tingling in her hands that increased during last PT session went back to usual pretty quickly.   PAIN:  Are you having pain? 4-5/10 right side of the neck.   OBJECTIVE:   TODAY'S TREATMENT:  Therapeutic exercise: to centralize symptoms and improve ROM, strength, muscular endurance, and activity tolerance required for successful completion of functional activities.  - Upper body ergometer level 5 to encourage joint nutrition, warm tissue, induce analgesic effect of aerobic exercise, improve muscular strength and endurance,  and prepare for remainder of session. 5 minutes   (Manual therapy - see below) - hooklying chin tuck for deep neck flexors, 1x10 with up to 5 second holds.  - seated medial and ulnar nerve floss, 1x10 each side for each nerve as a slider plus 1x10 tensioner on each side for median nerve.  - standing rows with cable, 2x10 at 15# - seated chest press, 2x10 at 5#  Manual therapy: to reduce pain and tissue tension, improve range of motion, neuromodulation, in order to promote improved ability to complete functional activities.  HOOKLYING - STM to posterior cervical spine musculature focusing on B UT, cervical  paraspinals, and suboccipitals. STM to B SCM  Pt required multimodal cuing for proper technique and to facilitate improved neuromuscular control, strength, range of motion, and functional ability resulting in improved performance and form.   PATIENT EDUCATION:  Education details: Education on diagnosis, prognosis, POC, anatomy and physiology of current condition.  Person educated: Patient and father Richardson Landry Education method: Explanation Education comprehension: verbalized understanding and needs further education     HOME EXERCISE PROGRAM: Access Code: [email or web address] URL: [email or web address] Date: 03/18/2022 Prepared by: Rosita Kea  Exercises - Ulnar Nerve Flossing  - 2 x daily - 1 sets - 15 reps - Median Nerve Flossing - Tray  - 2 x daily - 1 sets - 15 reps - Row with band/cable  - 1 x daily - 3 sets - 10 reps - 2 seconds hold - Supine Head Nod Deep Neck Flexor Training  - 1 x daily - 2 sets - 10 reps - 5 hold   ASSESSMENT:   CLINICAL IMPRESSION:  Patient tolerated treatment well overall but fatigued quickly with exercise. Patient reports discomfort with rows but appears acceptable to patient. Plan to continue with postural strengthening next session. Patient would benefit from continued management of limiting condition by skilled physical therapist to address remaining impairments and functional limitations to work towards stated goals and return  to PLOF or maximal functional independence.   Patient is a 31 y.o. female referred to outpatient physical therapy with a medical diagnosis of right arm weakness, cervicalgia who presents with signs and symptoms consistent with chronic neck pain and B LE pain and paresthesia, weakness, and impaired fine motor control. Unable to establish clear connection between neck pain and UE symptoms today with objective testing. Symptoms appear worse along radial side of hand/forearm R > L and plan to continue examination next session for signs of  peripheral neural entrapment. Plan to address both neck and arm regions as appropriate in upcoming sessions. Patient would benefit from seeing OT for lymphedema assessment and education. Patient presents with significant pain, paresthesia, motor control, muscle tension, postural, muscle performance (strength/power/endurance) and activity tolerance  impairments that are limiting ability to complete her usual activities including activities requiring fine motor skills, anything requiring repetitive UE use, working at the post office, cooking, cutting, slicing, writing, art, crochet, drawing, painting, etc, without difficulty. Patient will benefit from skilled physical therapy intervention to address current body structure impairments and activity limitations to improve function and work towards goals set in current POC in order to return to prior level of function or maximal functional improvement.    OBJECTIVE IMPAIRMENTS decreased activity tolerance, decreased coordination, decreased endurance, decreased knowledge of condition, decreased strength, increased edema, impaired perceived functional ability, increased muscle spasms, impaired flexibility, impaired sensation, impaired tone, impaired UE functional use, improper body mechanics, postural dysfunction, and pain.    ACTIVITY LIMITATIONS carrying, lifting, and reach over head   PARTICIPATION LIMITATIONS: meal prep, cleaning, medication management, interpersonal relationship, community activity, occupation, and   activities requiring fine motor skills, anything requiring repetitive UE use, working at the post office, cooking, cutting, slicing, writing, art, crochet, drawing, painting, etc.   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    REHAB POTENTIAL: Good   CLINICAL DECISION MAKING: Evolving/moderate complexity   EVALUATION COMPLEXITY: Moderate     GOALS: Goals reviewed with patient? No   SHORT TERM GOALS: Target date: 03/31/2022   Patient will be independent with initial home exercise program for self-management of symptoms. Baseline: Initial HEP to be provided at visit 2 as appropriate (03/16/22); Goal status: In-progress     LONG TERM GOALS: Target date: 06/08/2022   Patient will be independent with a long-term home exercise program for self-management of symptoms.  Baseline: Initial HEP to be provided at visit 2 as appropriate (03/16/22); Goal status: In-progress   2.  Patient will demonstrate improved FOTO to equal or greater than 66 by visit #11 to demonstrate improvement in overall condition and self-reported functional ability.  Baseline: 51 (03/16/22); Goal status: In-progress   3.  Patient will demonstrate full cervical spine AROM with no increase in pain except temporary end range discomfort to improve her ability to view her surroundings and complete her work with less difficulty.  Baseline: end range pain - see objective exam,  (03/16/22); Goal status: In-progress   4.  Patient will demonstrate B UE strength equal or greater than 5/5 in all motions with  no increase in pain to demonstrate improved ability to use B UE for functional activities.  Baseline: decreased strength - see objective exam (03/16/22); Goal status: In-progress   5.  Patient will complete community, work and/or recreational activities without limitation due to current condition.   Baseline: activities requiring fine motor skills, anything requiring repetitive UE use, working at the post office, cooking, cutting, slicing, writing, art, crochet, drawing, painting, etc (03/16/22); Goal status: In-progress   6.  Patient will demonstrate each finger to thumb tip to tip touch without hesitation with eyes closed to demonstrate improved fine motor ability in hands.  Baseline: decreased speed and dependent on visual compensation (03/16/2022);  Goal status: In-progress       PLAN: PT FREQUENCY: 1-2x/week   PT DURATION: 12 weeks   PLANNED INTERVENTIONS: Therapeutic exercises, Therapeutic activity, Neuromuscular re-education, Balance training, Patient/Family education, Joint mobilization, Aquatic Therapy, Dry Needling, Electrical stimulation, Spinal mobilization, Cryotherapy, Moist heat, Manual therapy, and Re-evaluation   PLAN FOR NEXT SESSION: update HEP as appropriate, manual therapy, postural strengthening, UE strengthening, nerve gliding, fine motor tasks as appropriate.      Everlean Alstrom. Graylon Good, PT, DPT 03/23/22, 6:40 PM  [facility] [address] [place],  [address] P: [phone] I F: [phone]

## 2022-03-25 ENCOUNTER — Encounter: Payer: Self-pay | Admitting: Physical Therapy

## 2022-03-25 ENCOUNTER — Ambulatory Visit: Payer: Self-pay | Admitting: Physical Therapy

## 2022-03-25 DIAGNOSIS — M79601 Pain in right arm: Secondary | ICD-10-CM

## 2022-03-25 DIAGNOSIS — M79602 Pain in left arm: Secondary | ICD-10-CM

## 2022-03-25 DIAGNOSIS — M542 Cervicalgia: Secondary | ICD-10-CM

## 2022-03-25 DIAGNOSIS — M6281 Muscle weakness (generalized): Secondary | ICD-10-CM

## 2022-03-25 DIAGNOSIS — R278 Other lack of coordination: Secondary | ICD-10-CM

## 2022-03-25 DIAGNOSIS — R202 Paresthesia of skin: Secondary | ICD-10-CM

## 2022-03-25 NOTE — Therapy (Signed)
OUTPATIENT PHYSICAL THERAPY TREATMENT NOTE   Patient Name: Vickie Mathis MRN: [id number] DOB:Oct 14, 1990, 31 y.o., female Today's Date: 03/25/2022  PCP: Juluis Pitch, MD REFERRING PROVIDER: Meade Maw, MD  END OF SESSION:   PT End of Session - 03/25/22 1720     Visit Number 4    Number of Visits 24    Date for PT Re-Evaluation 06/08/22    Authorization Type Self pay reporting period from 03/16/2022    Progress Note Due on Visit 10    PT Start Time 4196    PT Stop Time 1730    PT Time Calculation (min) 40 min    Activity Tolerance Patient tolerated treatment well    Behavior During Therapy [facility] for tasks assessed/performed               Past Medical History:  Diagnosis Date   Anxiety    Asthma    Breast cancer ([place]) 11/2020   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 AXILLARY NODE DISSECTION;  Surgeon: Herbert Pun, MD;  Location: [facility] ORS;  Service: General;  Laterality: Bilateral;  [place], 1.5 hours Cintron-Diaz 2.5 hours   BREAST BIOPSY Left 11/26/2020   vision 12:00 6cmfn [facility]   BREAST BIOPSY Left 11/26/2020   LN bx, hydro marker,  fragments of macrometastatic carcinoma   BREAST RECONSTRUCTION WITH PLACEMENT OF TISSUE EXPANDER AND FLEX HD (ACELLULAR HYDRATED DERMIS) Bilateral 06/25/2021   Procedure: BREAST RECONSTRUCTION WITH PLACEMENT OF TISSUE EXPANDER AND FLEX HD (ACELLULAR HYDRATED DERMIS);  Surgeon: Wallace Going, DO;  Location: [facility] ORS;  Service: Plastics;  Laterality: Bilateral;   PORTACATH PLACEMENT Right 12/13/2020   Procedure: INSERTION PORT-A-CATH;  Surgeon: Herbert Pun, MD;  Location: [facility] ORS;  Service: General;  Laterality: Right;   REMOVAL OF BILATERAL TISSUE EXPANDERS WITH PLACEMENT OF BILATERAL BREAST IMPLANTS Bilateral 09/15/2021   Procedure: REMOVAL OF BILATERAL TISSUE EXPANDERS;  Surgeon: Wallace Going, DO;  Location: [place];  Service: Plastics;  Laterality: Bilateral;   Patient Active Problem List   Diagnosis Date Noted   Chemotherapy-induced neuropathy ([place]) 02/06/2022   Right sided weakness 02/06/2022   Transaminitis 11/20/2021   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 10/25/2021   Malignant neoplasm of left breast in female, estrogen receptor positive ([place]) 06/25/2021   Genetic testing 01/15/2021   Family history of cancer    Invasive carcinoma of breast ([place]) 12/03/2020   Breast mass, left 07/01/2020   Situational mixed anxiety and depressive disorder 07/01/2020   Anxiety state 08/06/2014   Varicose veins of lower extremity 08/06/2014    REFERRING DIAG: right arm weakness, cervicalgia  THERAPY DIAG:  Cervicalgia  Pain in right arm  Pain in left arm  Paresthesia of skin  Muscle weakness (generalized)  Other lack of coordination  Rationale for Evaluation and Treatment: Rehabilitation  ONSET DATE: worsened 12/2021  PERTINENT HISTORY: Patient is a 31 y.o. female who presents to outpatient physical therapy with a referral for medical diagnosis right arm weakness, cervicalgia. This patient's chief complaints consist of chronic neck pain, worsened in the last 3 months and B UE pain and paresthesia  worse on the right and worse on the radial side of the hand leading to the following functional deficits: difficulty with activities requiring fine motor skills, anything requiring repetitive UE use, working at the post office, cooking, cutting, slicing, writing, art, crochet, drawing, painting, etc. 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    PRECAUTIONS: none  SUBJECTIVE: Patient reports she is feeling sore today and was sore after last PT session. She has been doing her HEP. She is planning to ask  Dr. Janese Banks at her next visit (maybe 04/06/22) to go back to her usual dose of depression medication because she has really been struggling. She states she had her UE nerve conduction study at [facility] today and although the results are not in, the person doing the test said unofficially the test suggested carpal tunnel syndrome.   PAIN:  Are you having pain? 6/10 right side of the neck.   OBJECTIVE:   TODAY'S TREATMENT:  Therapeutic exercise: to centralize symptoms and improve ROM, strength, muscular endurance, and activity tolerance required for successful completion of functional activities.  - NuStep level 1 using bilateral upper and lower extremities. Seat/handle setting 10/12. For improved extremity mobility, muscular endurance, and activity tolerance; and to induce the analgesic effect of aerobic exercise, stimulate improved joint nutrition, and prepare body structures and systems for following interventions. x 5:45  minutes. Average SPM = 56. - standing rows with cable, 3x10 at 15# - seated chest press, 3x10 at 15# - Standing cervical thoracic extension/BUE flexion and serratus anterior activation, lat stretch, with foam roller up wall, 1x20. - Sidelying open book (thoracic rotation) to improve thoracic, shoulder girdle, and upper trunk mobility. 5 reps L rotation,  (limited by increasing neck pain when rotating to left), 10 reps R  rotation.   Manual therapy: to reduce pain and tissue tension, improve range of motion, neuromodulation, in order to promote improved ability to complete functional activities.  HOOKLYING - STM to posterior cervical spine musculature focusing on B cervical paraspinals, R > L. Educated about dry needling.   Pt required multimodal cuing for proper technique and to facilitate improved neuromuscular control, strength, range of motion, and functional ability resulting in improved performance and form.   PATIENT EDUCATION:  Education details: Education on diagnosis, prognosis, POC, anatomy and physiology of current condition.  Person educated: Patient and father Richardson Landry Education method: Explanation Education comprehension: verbalized understanding and needs further education     HOME EXERCISE PROGRAM: Access Code: [email or web address] URL: [email or web address] Date: 03/18/2022 Prepared by: Rosita Kea  Exercises - Ulnar Nerve Flossing  - 2 x daily - 1 sets - 15 reps - Median Nerve Flossing - Tray  - 2 x daily - 1 sets - 15 reps - Row with band/cable  - 1 x daily - 3 sets - 10 reps - 2 seconds hold - Supine Head Nod Deep Neck Flexor Training  - 1 x daily - 2 sets - 10 reps - 5 hold   ASSESSMENT:   CLINICAL IMPRESSION: Patient tolerated treatment with some difficulty due to tenderness with manual therapy and discomfort with some exercise. Patient was able to progress exercises slightly. She has significant  trigger points in right cervical spine region, especially at the right paraspinals and may benefit from dry needling to this region. Patient agreeable to proceed with dry needling in the future. Plan to continue working on improving postural and UE strength and interventions to decrease pain and tension next session. Patient would benefit from continued management of limiting condition by skilled physical therapist to address remaining impairments and functional limitations to work towards  stated goals and return to PLOF or maximal functional independence.   Patient is a 31 y.o. female referred to outpatient physical therapy with a medical diagnosis of right arm weakness, cervicalgia who presents with signs and symptoms consistent with chronic neck pain and B LE pain and paresthesia, weakness, and impaired fine motor control. Unable to establish clear connection between neck pain and UE symptoms today with objective testing. Symptoms appear worse along radial side of hand/forearm R > L and plan to continue examination next session for signs of peripheral neural entrapment. Plan to address both neck and arm regions as appropriate in upcoming sessions. Patient would benefit from seeing OT for lymphedema assessment and education. Patient presents with significant pain, paresthesia, motor control, muscle tension, postural, muscle performance (strength/power/endurance) and activity tolerance  impairments that are limiting ability to complete her usual activities including activities requiring fine motor skills, anything requiring repetitive UE use, working at the post office, cooking, cutting, slicing, writing, art, crochet, drawing, painting, etc, without difficulty. Patient will benefit from skilled physical therapy intervention to address current body structure impairments and activity limitations to improve function and work towards goals set in current POC in order to return to prior level of function or maximal functional improvement.    OBJECTIVE IMPAIRMENTS decreased activity tolerance, decreased coordination, decreased endurance, decreased knowledge of condition, decreased strength, increased edema, impaired perceived functional ability, increased muscle spasms, impaired flexibility, impaired sensation, impaired tone, impaired UE functional use, improper body mechanics, postural dysfunction, and pain.    ACTIVITY LIMITATIONS carrying, lifting, and reach over head   PARTICIPATION  LIMITATIONS: meal prep, cleaning, medication management, interpersonal relationship, community activity, occupation, and   activities requiring fine motor skills, anything requiring repetitive UE use, working at the post office, cooking, cutting, slicing, writing, art, crochet, drawing, painting, etc.   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    REHAB POTENTIAL: Good   CLINICAL DECISION MAKING: Evolving/moderate complexity   EVALUATION COMPLEXITY: Moderate     GOALS: Goals reviewed with patient? No   SHORT TERM GOALS: Target date: 03/31/2022   Patient will be independent with initial home exercise program for self-management of symptoms. Baseline: Initial HEP to be provided at visit 2 as appropriate (03/16/22); Goal status: In-progress     LONG TERM GOALS: Target date: 06/08/2022   Patient will be independent with a long-term home exercise program for self-management of symptoms.  Baseline: Initial HEP to be provided at visit 2 as appropriate (03/16/22); Goal status: In-progress   2.  Patient will demonstrate improved FOTO to equal or greater than 66 by visit #11 to demonstrate improvement in overall condition and self-reported functional  ability.  Baseline: 51 (03/16/22); Goal status: In-progress   3.  Patient will demonstrate full cervical spine AROM with no increase in pain except temporary end range discomfort to  improve her ability to view her surroundings and complete her work with less difficulty.  Baseline: end range pain - see objective exam,  (03/16/22); Goal status: In-progress   4.  Patient will demonstrate B UE strength equal or greater than 5/5 in all motions with no increase in pain to demonstrate improved ability to use B UE for functional activities.  Baseline: decreased strength - see objective exam (03/16/22); Goal status: In-progress   5.  Patient will complete community, work and/or recreational activities without limitation due to current condition.  Baseline: activities requiring fine motor skills, anything requiring repetitive UE use, working at the post office, cooking, cutting, slicing, writing, art, crochet, drawing, painting, etc (03/16/22); Goal status: In-progress   6.  Patient will demonstrate each finger to thumb tip to tip touch without hesitation with eyes closed to demonstrate improved fine motor ability in hands.  Baseline: decreased speed and dependent on visual compensation (03/16/2022);  Goal status: In-progress       PLAN: PT FREQUENCY: 1-2x/week   PT DURATION: 12 weeks   PLANNED INTERVENTIONS: Therapeutic exercises, Therapeutic activity, Neuromuscular re-education, Balance training, Patient/Family education, Joint mobilization, Aquatic Therapy, Dry Needling, Electrical stimulation, Spinal mobilization, Cryotherapy, Moist heat, Manual therapy, and Re-evaluation   PLAN FOR NEXT SESSION: update HEP as appropriate, manual therapy, postural strengthening, UE strengthening, nerve gliding, fine motor tasks as appropriate.      Everlean Alstrom. Graylon Good, PT, DPT 03/25/22, 8:49 PM  [facility] [address] [place], [place] [address] P: [phone] I F: [phone]

## 2022-03-26 ENCOUNTER — Other Ambulatory Visit: Payer: Self-pay | Admitting: *Deleted

## 2022-03-26 DIAGNOSIS — C50919 Malignant neoplasm of unspecified site of unspecified female breast: Secondary | ICD-10-CM

## 2022-03-26 DIAGNOSIS — I972 Postmastectomy lymphedema syndrome: Secondary | ICD-10-CM

## 2022-04-01 ENCOUNTER — Ambulatory Visit: Payer: Self-pay | Admitting: Occupational Therapy

## 2022-04-01 ENCOUNTER — Ambulatory Visit: Payer: Self-pay | Admitting: Physical Therapy

## 2022-04-01 ENCOUNTER — Encounter: Payer: Self-pay | Admitting: Oncology

## 2022-04-01 ENCOUNTER — Encounter: Payer: Self-pay | Admitting: Internal Medicine

## 2022-04-01 DIAGNOSIS — I972 Postmastectomy lymphedema syndrome: Secondary | ICD-10-CM

## 2022-04-01 NOTE — Therapy (Signed)
[facility] [address], [place], [address] Phone: [phone]   Fax:  [phone]  Occupational Therapy Treatment  Patient Details  Name: Vickie Mathis MRN: [id number] Date of Birth: [birth year] No data recorded  Encounter Date: 04/01/2022   OT End of Session - 04/01/22 1951     Visit Number 0             Past Medical History:  Diagnosis Date   Anxiety    Asthma    Breast cancer ([place]) 11/2020   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 breast ca   Depression    Family history of cancer    History of chemotherapy    Varicose veins of bilateral lower extremities with pain     Past Surgical History:  Procedure Laterality Date   APPENDECTOMY  2018   BILATERAL TOTAL MASTECTOMY WITH AXILLARY LYMPH NODE DISSECTION Bilateral 06/25/2021   Procedure: BILATERAL TOTAL MASTECTOMY WITH LEFT AXILLARY LYMPH NODE BIOPSY VS. AXILLARY NODE DISSECTION;  Surgeon: Herbert Pun, MD;  Location: [facility] ORS;  Service: General;  Laterality: Bilateral;  [place], 1.5 hours Cintron-Diaz 2.5 hours   BREAST BIOPSY Left 11/26/2020   vision 12:00 6cmfn [facility]   BREAST BIOPSY Left 11/26/2020   LN bx, hydro marker,  fragments of macrometastatic carcinoma   BREAST RECONSTRUCTION WITH PLACEMENT OF TISSUE EXPANDER AND FLEX HD (ACELLULAR HYDRATED DERMIS) Bilateral 06/25/2021   Procedure: BREAST RECONSTRUCTION WITH PLACEMENT OF TISSUE EXPANDER AND FLEX HD (ACELLULAR HYDRATED DERMIS);  Surgeon: Wallace Going, DO;  Location: [facility];  Service: Plastics;  Laterality: Bilateral;   PORTACATH PLACEMENT Right 12/13/2020   Procedure: INSERTION PORT-A-CATH;  Surgeon: Herbert Pun, MD;  Location: [facility] ORS;  Service: General;  Laterality: Right;   REMOVAL OF BILATERAL TISSUE EXPANDERS WITH PLACEMENT OF BILATERAL BREAST IMPLANTS Bilateral 09/15/2021   Procedure: REMOVAL OF BILATERAL TISSUE EXPANDERS;  Surgeon: Wallace Going, DO;  Location:  [place];  Service: Plastics;  Laterality: Bilateral;    There were no vitals filed for this visit.   Subjective Assessment - 04/01/22 1950     Subjective  I did get the Jovi pack and I think is helping for the swelling on my chest and under my arm.  I am on steroids since February so I am not sure if I gained weight since 2 weeks ago.                 LYMPHEDEMA/ONCOLOGY QUESTIONNAIRE - 04/01/22 0001       Right Upper Extremity Lymphedema   15 cm Proximal to Olecranon Process 37 cm    10 cm Proximal to Olecranon Process 34.4 cm    Olecranon Process 31 cm    15 cm Proximal to Ulnar Styloid Process 30.8 cm    10 cm Proximal to Ulnar Styloid Process 26.5 cm    Just Proximal to Ulnar Styloid Process 18 cm      Left Upper Extremity Lymphedema   15 cm Proximal to Olecranon Process 36.8 cm    10 cm Proximal to Olecranon Process 34.4 cm    Olecranon Process 31 cm    15 cm Proximal to Ulnar Styloid Process 30.8 cm    10 cm Proximal to Ulnar Styloid Process 26.5 cm    Just Proximal to Ulnar Styloid Process 18.3 cm               PT CLINICAL SUMMARY 03/17/22: Clarise Cruz SNYDER Patient is a 31 y.o. female referred  to outpatient physical therapy with a medical diagnosis of right arm weakness, cervicalgia who presents with signs and symptoms consistent with chronic neck pain and B LE pain and paresthesia, weakness, and impaired fine motor control. Unable to establish clear connection between neck pain and UE symptoms today with objective testing. Symptoms appear worse along radial side of hand/forearm R > L and plan to continue examination next session for signs of peripheral neural entrapment. Plan to address both neck and arm regions as appropriate in upcoming sessions. Patient would benefit from seeing OT for lymphedema assessment and education. Patient presents with significant pain, paresthesia, motor control, muscle tension, postural, muscle performance  (strength/power/endurance) and activity tolerance  impairments that are limiting ability to complete her usual activities including activities requiring fine motor skills, anything requiring repetitive UE use, working at the post office, cooking, cutting, slicing, writing, art, crochet, drawing, painting, etc, without difficulty. Patient will benefit from skilled physical therapy intervention to address current body structure impairments and activity limitations to improve function and work towards goals set in current POC in order to return to prior level of function or maximal functional improvement     OT SCREEN 03/18/22: Patient had double mastectomy on 9/22.  With expanders put in by Dr. Marla Roe.  Patient developed infection and on 12/22 expanders was removed.  Patient reports 2 lymph nodes was removed sittings.  She did had radiation.  That ended in March 23. It appears patient noticed around 2 weeks ago swelling on the left side on thoracic and chest wall.  It did subside.  Per patient it staying about the same now and somewhat into the chest wall to.  Did measure bilateral circumference of upper extremity compared to the left to the right.  Patient is right-handed, but left arm appears increase in the upper arm a 1.6 cm.  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  Would recommend at this stage for patient to unilateral postmastectomy Jovi pack breast pad to wear under light compression for 2 weeks.  Patient to follow-up with me in 2 weeks again. Patient report during PT increased neuropathy in bilateral hands as well as some numbness in the right fourth and fifth digit.  Do appear patient had a positive Tinel with tenderness in the right cubital tunnel.  Reviewed with patient positioning of elbow during sleeping as well as sitting during the day.  Patient to avoid cradling right hand on chest as well as propping up.  Also  avoid sleeping in the fetal position.  Patient report numbness is worse in the morning.  Recommended for her maybe to get volleyball pads that works great with cubital tunnel to wear padding over the cubital tunnel during the day and at nighttime on the volar side of the elbow to prevent prevent flexion. Patient can try to modify a little bit her ADLs by not over gripping objects open forearm pain, enlarging grips, using larger joints or palms to carry and lift things.     OT SCREEN 04/01/22: Patient return after seen for screening 2 weeks ago.  Patient's bilateral  UE circumference within  normal limits compared to each other.  Patient reported about 4 weeks ago she had some swelling on the left side on thoracic and chest wall-recommended for patient to get a Jovi pack unilateral postmastectomy pad 2 weeks ago. She reported most of  the time under light compression bra.  Patient arrived this day with Jovi pack in place.  Reviewed with patient about wearing it correctly.  Patient reported lymphedema is better. Patient circumference.  A little bit this date but patient on steroids and increase was bilaterally evenly. Patient to continue with same home program for 4 to 6 weeks and can follow-up with me if needed.                                              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 [place], [address] Phone: [phone]   Fax:  [phone]  Name: Thirza Pellicano MRN: [id number] Date of Birth: 23-Sep-1991

## 2022-04-05 ENCOUNTER — Encounter: Payer: Self-pay | Admitting: Oncology

## 2022-04-05 ENCOUNTER — Encounter: Payer: Self-pay | Admitting: Internal Medicine

## 2022-04-06 ENCOUNTER — Other Ambulatory Visit: Payer: Self-pay | Admitting: Oncology

## 2022-04-06 ENCOUNTER — Inpatient Hospital Stay: Payer: No Typology Code available for payment source | Attending: Oncology

## 2022-04-06 ENCOUNTER — Encounter: Payer: Self-pay | Admitting: Oncology

## 2022-04-06 ENCOUNTER — Encounter: Payer: Self-pay | Admitting: Internal Medicine

## 2022-04-06 ENCOUNTER — Inpatient Hospital Stay (HOSPITAL_BASED_OUTPATIENT_CLINIC_OR_DEPARTMENT_OTHER): Payer: Commercial Managed Care - HMO | Admitting: Oncology

## 2022-04-06 VITALS — BP 127/69 | HR 94 | Temp 98.1°F | Resp 16 | Ht 69.0 in | Wt 213.4 lb

## 2022-04-06 DIAGNOSIS — G620 Drug-induced polyneuropathy: Secondary | ICD-10-CM

## 2022-04-06 DIAGNOSIS — Z79899 Other long term (current) drug therapy: Secondary | ICD-10-CM | POA: Insufficient documentation

## 2022-04-06 DIAGNOSIS — T451X5A Adverse effect of antineoplastic and immunosuppressive drugs, initial encounter: Secondary | ICD-10-CM | POA: Diagnosis not present

## 2022-04-06 DIAGNOSIS — Z7952 Long term (current) use of systemic steroids: Secondary | ICD-10-CM | POA: Insufficient documentation

## 2022-04-06 DIAGNOSIS — Z8049 Family history of malignant neoplasm of other genital organs: Secondary | ICD-10-CM | POA: Diagnosis not present

## 2022-04-06 DIAGNOSIS — K754 Autoimmune hepatitis: Secondary | ICD-10-CM | POA: Diagnosis not present

## 2022-04-06 DIAGNOSIS — Z853 Personal history of malignant neoplasm of breast: Secondary | ICD-10-CM | POA: Insufficient documentation

## 2022-04-06 DIAGNOSIS — F419 Anxiety disorder, unspecified: Secondary | ICD-10-CM | POA: Insufficient documentation

## 2022-04-06 DIAGNOSIS — Z9221 Personal history of antineoplastic chemotherapy: Secondary | ICD-10-CM | POA: Insufficient documentation

## 2022-04-06 DIAGNOSIS — F32A Depression, unspecified: Secondary | ICD-10-CM | POA: Insufficient documentation

## 2022-04-06 DIAGNOSIS — Z809 Family history of malignant neoplasm, unspecified: Secondary | ICD-10-CM | POA: Insufficient documentation

## 2022-04-06 DIAGNOSIS — Z9013 Acquired absence of bilateral breasts and nipples: Secondary | ICD-10-CM | POA: Insufficient documentation

## 2022-04-06 DIAGNOSIS — Z95828 Presence of other vascular implants and grafts: Secondary | ICD-10-CM

## 2022-04-06 DIAGNOSIS — Z08 Encounter for follow-up examination after completed treatment for malignant neoplasm: Secondary | ICD-10-CM

## 2022-04-06 LAB — COMPREHENSIVE METABOLIC PANEL
ALT: 52 U/L — ABNORMAL HIGH (ref 0–44)
AST: 54 U/L — ABNORMAL HIGH (ref 15–41)
Albumin: 4.2 g/dL (ref 3.5–5.0)
Alkaline Phosphatase: 154 U/L — ABNORMAL HIGH (ref 38–126)
Anion gap: 7 (ref 5–15)
BUN: 9 mg/dL (ref 6–20)
CO2: 24 mmol/L (ref 22–32)
Calcium: 8.6 mg/dL — ABNORMAL LOW (ref 8.9–10.3)
Chloride: 104 mmol/L (ref 98–111)
Creatinine, Ser: 0.62 mg/dL (ref 0.44–1.00)
GFR, Estimated: >60 mL/min (ref >60)
Glucose, Bld: 112 mg/dL — ABNORMAL HIGH (ref 70–99)
Potassium: 4.1 mmol/L (ref 3.5–5.1)
Sodium: 135 mmol/L (ref 135–145)
Total Bilirubin: 0.9 mg/dL (ref 0.3–1.2)
Total Protein: 6.8 g/dL (ref 6.5–8.1)

## 2022-04-06 LAB — CBC WITH DIFFERENTIAL/PLATELET
Abs Immature Granulocytes: 0.14 10*3/uL — ABNORMAL HIGH (ref 0.00–0.07)
Basophils Absolute: 0 10*3/uL (ref 0.0–0.1)
Basophils Relative: 0 %
Eosinophils Absolute: 0 10*3/uL (ref 0.0–0.5)
Eosinophils Relative: 0 %
HCT: 34.8 % — ABNORMAL LOW (ref 36.0–46.0)
Hemoglobin: 11.5 g/dL — ABNORMAL LOW (ref 12.0–15.0)
Immature Granulocytes: 1 %
Lymphocytes Relative: 3 %
Lymphs Abs: 0.4 10*3/uL — ABNORMAL LOW (ref 0.7–4.0)
MCH: 32.5 pg (ref 26.0–34.0)
MCHC: 33 g/dL (ref 30.0–36.0)
MCV: 98.3 fL (ref 80.0–100.0)
Monocytes Absolute: 1 10*3/uL (ref 0.1–1.0)
Monocytes Relative: 10 %
Neutro Abs: 9.1 10*3/uL — ABNORMAL HIGH (ref 1.7–7.7)
Neutrophils Relative %: 86 %
Platelets: 169 10*3/uL (ref 150–400)
RBC: 3.54 MIL/uL — ABNORMAL LOW (ref 3.87–5.11)
RDW: 14.6 % (ref 11.5–15.5)
WBC: 10.6 10*3/uL — ABNORMAL HIGH (ref 4.0–10.5)
nRBC: 0 % (ref 0.0–0.2)

## 2022-04-06 MED ORDER — SODIUM CHLORIDE 0.9% FLUSH
10.0000 mL | Freq: Once | INTRAVENOUS | Status: AC
  Administered 2022-04-06: 10 mL via INTRAVENOUS
  Filled 2022-04-06: qty 10

## 2022-04-06 MED ORDER — OXYCODONE HCL 5 MG PO TABS: 5.0000 mg | ORAL_TABLET | Freq: Two times a day (BID) | ORAL | 0 refills | Status: DC

## 2022-04-06 MED ORDER — HEPARIN SOD (PORK) LOCK FLUSH 100 UNIT/ML IV SOLN
500.0000 [IU] | Freq: Once | INTRAVENOUS | Status: AC
  Administered 2022-04-06: 500 [IU] via INTRAVENOUS
  Filled 2022-04-06: qty 5

## 2022-04-06 NOTE — Addendum Note (Signed)
Addended by: Delice Bison E on: 04/06/2022 03:32 PM   Modules accepted: Orders

## 2022-04-06 NOTE — Progress Notes (Signed)
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  Ultrasound of the left axilla demonstrates 2 lymph nodes with mild thickened cortices of 4 mm.  There is skin thickening on the lower inner quadrant of the left breast on mammography.  Both the breast mass and the lymph node were biopsied and was positive for invasive mammary carcinoma grade 3.  Lymph node was also suspicious for extracapsular extension.    ER/PR and HER-2 negative   MRI showed irregular enhancing mass at the 12 o'clock position of the left breast measuring 2.5 x 2.3 cm and a linear component extending 2.3 cm anteriorly.  The mass in the anterior linear extension combined measure 4.3 cm.  3.9 cm in the cephalocaudal dimension.  Also an area of clumped non-mass enhancement in the posterior aspect of the lower quadrant of the left breast measuring 2.6 x 0.9 cm which looks suspicious.  2 enlarged left axillary lymph nodes and 2 mildly enlarged  internal mammary lymph nodes.  4.3 cm clumped area of non-mass enhancement in the outer quadrant of the right breast.  3 right axillary lymph nodes with mild cortical thickening.   Patient underwent biopsy of the right breast non-mass enhancement and that was negative for malignancy   CT scan showed mildly enlarged right inguinal lymph nodes nonspecific.  Left upper breast mass with prominent left axillary lymph nodes.  Subcentimeter pulmonary nodules which are calcified and compatible with benign old granulomatous disease.  0.6 5.4 cm lucent lesion in the left first rib possibly a hemangioma or benign lesion.   Patient developed significant infusion reaction to carboplatin with dose 9 when her blood pressure dropped and she became tachycardic and significantly nauseous.  She went on to complete [facility] Keytruda chemotherapy as per keynote 522 regimen.  Patient underwent bilateral mastectomy with reconstruction in September 2022.  Final pathology showed complete pathological response2 sentinel lymph nodes negative for malignancy.  No malignancy noted in the right breast.   Plan was to complete 9 cycles of adjuvant Keytruda.  Patient noted to have abnormal LFTs on Keytruda.  Last dose of Keytruda given on 10/08/2021.  Subsequently her LFTs were significantly elevated and patient did not receive any further Keytruda.  She was admitted to [facility] for grade 4 hepatitis likely secondary to [facility] and was started on Solu-Medrol and is currently on prednisone taper and mycophenolate      Interval history-still reports ongoing fatigue.  Neuropathy is stable in her bilateral hands and feet but she would like to try a higher dose of gabapentin.  She would like to increase the dose  of her antidepressant medicine Celexa since it was decreased during her liver failure.  ECOG PS- 1 Pain scale- 3 Opioid associated constipation- no  Review of systems- Review of Systems  Constitutional:  Positive for malaise/fatigue.  Negative for chills, fever and weight loss.  HENT:  Negative for congestion, ear discharge and nosebleeds.   Eyes:  Negative for blurred vision.  Respiratory:  Negative for cough, hemoptysis, sputum production, shortness of breath and wheezing.   Cardiovascular:  Negative for chest pain, palpitations, orthopnea and claudication.  Gastrointestinal:  Negative for abdominal pain, blood in stool, constipation, diarrhea, heartburn, melena, nausea and vomiting.  Genitourinary:  Negative for dysuria, flank pain, frequency, hematuria and urgency.  Musculoskeletal:  Negative for back pain, joint pain and myalgias.  Skin:  Negative for rash.  Neurological:  Positive for sensory change (Peripheral neuropathy). Negative for dizziness, tingling, focal weakness, seizures, weakness and headaches.  Endo/Heme/Allergies:  Does not bruise/bleed easily.  Psychiatric/Behavioral:  Negative for depression and suicidal ideas. The patient does not have insomnia.       Allergies  Allergen Reactions   Carboplatin Shortness Of Breath, Nausea And Vomiting and Other (See Comments)    Flushing- chest , face, neck , arm including hand   Amoxicillin     Other reaction(s): "too young to remember what they do to me"   Sulfa Antibiotics     Other reaction(s): "too young to remember what they do to me"     Past Medical History:  Diagnosis Date   Anxiety    Asthma    Breast cancer ([place]) 11/2020   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 breast ca   Depression    Family history of cancer    History of chemotherapy    Varicose veins of bilateral lower extremities with pain      Past Surgical History:  Procedure Laterality Date   APPENDECTOMY  2018   BILATERAL TOTAL MASTECTOMY WITH AXILLARY LYMPH NODE DISSECTION Bilateral 06/25/2021   Procedure: BILATERAL TOTAL MASTECTOMY WITH LEFT AXILLARY LYMPH NODE BIOPSY VS. AXILLARY NODE DISSECTION;  Surgeon: Herbert Pun, MD;  Location: [facility] ORS;  Service: General;  Laterality:  Bilateral;  Dillingham, 1.5 hours Cintron-Diaz 2.5 hours   BREAST BIOPSY Left 11/26/2020   vision 12:00 6cmfn [facility]   BREAST BIOPSY Left 11/26/2020   LN bx, hydro marker,  fragments of macrometastatic carcinoma   BREAST RECONSTRUCTION WITH PLACEMENT OF TISSUE EXPANDER AND FLEX HD (ACELLULAR HYDRATED DERMIS) Bilateral 06/25/2021   Procedure: BREAST RECONSTRUCTION WITH PLACEMENT OF TISSUE EXPANDER AND FLEX HD (ACELLULAR HYDRATED DERMIS);  Surgeon: Wallace Going, DO;  Location: [facility] ORS;  Service: Plastics;  Laterality: Bilateral;   PORTACATH PLACEMENT Right 12/13/2020   Procedure: INSERTION PORT-A-CATH;  Surgeon: Herbert Pun, MD;  Location: [facility] ORS;  Service: General;  Laterality: Right;   REMOVAL OF BILATERAL TISSUE EXPANDERS WITH PLACEMENT OF BILATERAL BREAST IMPLANTS Bilateral 09/15/2021   Procedure: REMOVAL OF BILATERAL TISSUE EXPANDERS;  Surgeon: Wallace Going, DO;  Location: [facility];  Service: Plastics;  Laterality: Bilateral;    Social History   Socioeconomic History   Marital status: Married    Spouse name: Not on file   Number of children: Not on file   Years of education: Not on file   Highest education level: Not on file  Occupational History   Not on file  Tobacco Use   Smoking status: Never   Smokeless tobacco: Never  Vaping Use   Vaping Use: Never used  Substance and Sexual Activity  Alcohol use: Not Currently    Comment: occassinally    Drug use: Yes    Types: Marijuana    Comment: occ   Sexual activity: Yes  Other Topics Concern   Not on file  Social History Narrative   Not on file   Social Determinants of Health   Financial Resource Strain: Not on file  Food Insecurity: Not on file  Transportation Needs: No Transportation Needs (03/09/2022)   PRAPARE - Hydrologist (Medical): No    Lack of Transportation (Non-Medical): No  Physical Activity: Not on file  Stress: Not on file  Social  Connections: Not on file  Intimate Partner Violence: Not on file    Family History  Problem Relation Age of Onset   Diabetes Father    Varicose Veins Father    Diabetes Paternal Aunt    Uterine cancer Paternal Aunt        precancerous   Diabetes Paternal Grandmother    Uterine cancer Paternal Grandmother        precancerous   Thyroid disease Mother    Thyroid disease Maternal Aunt    Thyroid disease Maternal Grandmother    Cancer Other        stomach vs ovarian/cervical   Cancer Paternal Great-grandmother        unk     Current Outpatient Medications:    busPIRone (BUSPAR) 15 MG tablet, Take 15 mg by mouth 2 (two) times daily., Disp: , Rfl:    citalopram (CELEXA) 20 MG tablet, TAKE 1 TABLET BY MOUTH EVERY DAY, Disp: 90 tablet, Rfl: 1   clonazePAM (KLONOPIN) 0.5 MG tablet, TAKE 1 TABLET BY MOUTH 3 TIMES DAILY AS NEEDED., Disp: 60 tablet, Rfl: 0   Cyanocobalamin (B-12 PO), Take 1 tablet by mouth daily., Disp: , Rfl:    dapsone 100 MG tablet, TAKE 1 TABLET BY MOUTH EVERY DAY, Disp: 30 tablet, Rfl: 0   docusate sodium (COLACE) 100 MG capsule, Take 100 mg by mouth daily as needed for mild constipation., Disp: , Rfl:    folic acid (FOLVITE) 1 MG tablet, TAKE 2 TABLETS BY MOUTH EVERY DAY, Disp: 180 tablet, Rfl: 0   gabapentin (NEURONTIN) 300 MG capsule, Take 1 capsule (300 mg total) by mouth 3 (three) times daily., Disp: 42 capsule, Rfl: 3   lidocaine-prilocaine (EMLA) cream, Apply 1 application topically daily as needed (prior to port access)., Disp: 30 g, Rfl: 3   Multiple Vitamin (MULTIVITAMIN WITH MINERALS) TABS tablet, Take 1 tablet by mouth daily., Disp: , Rfl:    mycophenolate (CELLCEPT) 250 MG capsule, Take 1,000 mg by mouth 2 (two) times daily., Disp: , Rfl:    ondansetron (ZOFRAN ODT) 4 MG disintegrating tablet, Take 1 tablet (4 mg total) by mouth every 8 (eight) hours as needed for nausea or vomiting., Disp: 30 tablet, Rfl: 2   pantoprazole (PROTONIX) 40 MG tablet, Take 40 mg  by mouth daily., Disp: , Rfl:    predniSONE (DELTASONE) 20 MG tablet, Take 4 tablets (80 mg total) by mouth daily with breakfast. (Patient taking differently: Take 15 mg by mouth daily with breakfast. Only taking 15 mg daily now. Tapering off), Disp: 48 tablet, Rfl: 0   TURMERIC CURCUMIN PO, Take 2 capsules by mouth daily., Disp: , Rfl:    cholestyramine light (PREVALITE) 4 g packet, TAKE 1 PACKET (4 G TOTAL) BY MOUTH 2 TIMES DAILY. (Patient not taking: Reported on 02/23/2022), Disp: 90 packet, Rfl: 0   hydrOXYzine (VISTARIL)  25 MG capsule, TAKE 1 CAPSULE (25 MG TOTAL) BY MOUTH AT BEDTIME AS NEEDED FOR UP TO 20 DOSES. (Patient not taking: Reported on 12/22/2021), Disp: 20 capsule, Rfl: 0   methocarbamol (ROBAXIN) 500 MG tablet, TAKE 1 TABLET BY MOUTH 3 TIMES DAILY FOR 14 DAYS. (Patient not taking: Reported on 04/06/2022), Disp: 42 tablet, Rfl: 0   ondansetron (ZOFRAN) 8 MG tablet, Take 1 tablet (8 mg total) by mouth every 8 (eight) hours as needed for refractory nausea / vomiting. Start on day 3 after chemo. (Patient not taking: Reported on 01/19/2022), Disp: 60 tablet, Rfl: 3   ondansetron (ZOFRAN-ODT) 4 MG disintegrating tablet, Take 1 tablet (4 mg total) by mouth every 8 (eight) hours as needed for nausea or vomiting. (Patient not taking: Reported on 01/19/2022), Disp: 20 tablet, Rfl: 0   oxyCODONE (OXY IR/ROXICODONE) 5 MG immediate release tablet, Take 1 tablet (5 mg total) by mouth 2 (two) times daily., Disp: 60 tablet, Rfl: 0   potassium chloride SA (KLOR-CON M) 20 MEQ tablet, Take 1 tablet (20 mEq total) by mouth daily. (Patient not taking: Reported on 01/19/2022), Disp: 7 tablet, Rfl: 0   traZODone (DESYREL) 50 MG tablet, TAKE 0.5 TABLETS BY MOUTH AT BEDTIME AS NEEDED FOR SLEEP. (Patient not taking: Reported on 02/23/2022), Disp: 7 tablet, Rfl: 0   triamcinolone ointment (KENALOG) 0.5 %, Apply 1 application topically 2 (two) times daily. (Patient not taking: Reported on 02/06/2022), Disp: 30 g, Rfl: 0 No  current facility-administered medications for this visit.  Facility-Administered Medications Ordered in Other Visits:    prochlorperazine (COMPAZINE) tablet 10 mg, 10 mg, Oral, Q6H PRN, Sindy Guadeloupe, MD, 10 mg at 07/16/21 1407  Physical exam:  Vitals:   04/06/22 1107  BP: 127/69  Pulse: 94  Resp: 16  Temp: 98.1 F (36.7 C)  TempSrc: Tympanic  SpO2: 99%  Weight: 213 lb 6.4 oz (96.8 kg)  Height: 5' 9" (1.753 m)   Physical Exam Constitutional:      General: She is not in acute distress.    Comments: Steroid facies  Cardiovascular:     Rate and Rhythm: Normal rate and regular rhythm.     Heart sounds: Normal heart sounds.  Pulmonary:     Effort: Pulmonary effort is normal.     Breath sounds: Normal breath sounds.  Abdominal:     General: Bowel sounds are normal.     Palpations: Abdomen is soft.  Skin:    General: Skin is warm and dry.  Neurological:     Mental Status: She is alert and oriented to person, place, and time.         Latest Ref Rng & Units 04/06/2022   10:49 AM  CMP  Glucose 70 - 99 mg/dL 112   BUN 6 - 20 mg/dL 9   Creatinine 0.44 - 1.00 mg/dL 0.62   Sodium 135 - 145 mmol/L 135   Potassium 3.5 - 5.1 mmol/L 4.1   Chloride 98 - 111 mmol/L 104   CO2 22 - 32 mmol/L 24   Calcium 8.9 - 10.3 mg/dL 8.6   Total Protein 6.5 - 8.1 g/dL 6.8   Total Bilirubin 0.3 - 1.2 mg/dL 0.9   Alkaline Phos 38 - 126 U/L 154   AST 15 - 41 U/L 54   ALT 0 - 44 U/L 52       Latest Ref Rng & Units 04/06/2022   10:49 AM  CBC  WBC 4.0 - 10.5 K/uL 10.6  Hemoglobin 12.0 - 15.0 g/dL 11.5   Hematocrit 36.0 - 46.0 % 34.8   Platelets 150 - 400 K/uL 169     No images are attached to the encounter.  No results found.   Assessment and plan- Patient is a 31 y.o. female with stage IIIb triple negative breast cancer of the left breast cT2 N1 M0.  She is s/p neoadjuvant chemotherapy as per keynote 522 protocol followed by complete pathological response.  She is s/p 4 cycles of  adjuvant Keytruda complicated by grade 4 Keytruda induced hepatitis.  She is here for routine follow-up of autoimmune hepatitis  Autoimmune hepatitis: She also follows up with [facility] Dr. Merrilee Jansky.  I did get in touch with him and plan is to stay on mycophenolate 1250 twice daily.  She will be coming down on her prednisone dose to 10 mg.  Continue dapsone prophylaxis.  Repeat CBC with differential in 2 weeks in 4 weeks and I will see her back in 4 weeks.  Depression: She can go back on the dose of Celexa to 40 mg.This will be prescribed to her by her primary care doctor.  She is also on Wellbutrin  Chemo-induced peripheral neuropathy: Currently on as needed oxycodone.  She also on gabapentin have asked her to slowly increase the dose to 600 mg 3 times daily.  At my next visit I will talk about tapering her opioids since she is overall improving clinically and I do not want her to stay on opioids for the rest of her life.  Anxiety: Currently on as needed Ativan which will need to be weaned off as well at some point  We will continue to use her port for blood draws since she has poor IV access   Visit Diagnosis 1. Autoimmune hepatitis ([place])   2. Chemotherapy-induced peripheral neuropathy ([place])      Dr. Randa Evens, MD, MPH [facility] [phone] 04/06/2022 1:09 PM

## 2022-04-07 ENCOUNTER — Other Ambulatory Visit: Payer: Self-pay | Admitting: Oncology

## 2022-04-08 ENCOUNTER — Ambulatory Visit: Payer: No Typology Code available for payment source | Admitting: Physical Therapy

## 2022-04-09 ENCOUNTER — Telehealth: Payer: Self-pay | Admitting: Oncology

## 2022-04-09 NOTE — Telephone Encounter (Signed)
pt called in to have appts moved to an earlier time, She has another appt scheduled at [place] [facility] around same time .Marland KitchenKJ

## 2022-04-14 ENCOUNTER — Encounter: Payer: Self-pay | Admitting: Internal Medicine

## 2022-04-14 ENCOUNTER — Encounter: Payer: Self-pay | Admitting: Oncology

## 2022-04-20 ENCOUNTER — Inpatient Hospital Stay: Payer: No Typology Code available for payment source

## 2022-04-20 ENCOUNTER — Other Ambulatory Visit: Payer: No Typology Code available for payment source

## 2022-04-20 ENCOUNTER — Encounter: Payer: Self-pay | Admitting: *Deleted

## 2022-04-20 ENCOUNTER — Other Ambulatory Visit: Payer: Self-pay

## 2022-04-20 ENCOUNTER — Other Ambulatory Visit: Payer: Self-pay | Admitting: *Deleted

## 2022-04-20 DIAGNOSIS — G620 Drug-induced polyneuropathy: Secondary | ICD-10-CM

## 2022-04-20 DIAGNOSIS — K754 Autoimmune hepatitis: Secondary | ICD-10-CM | POA: Diagnosis not present

## 2022-04-20 LAB — CBC WITH DIFFERENTIAL/PLATELET
Abs Immature Granulocytes: 0.05 10*3/uL (ref 0.00–0.07)
Basophils Absolute: 0 10*3/uL (ref 0.0–0.1)
Basophils Relative: 0 %
Eosinophils Absolute: 0 10*3/uL (ref 0.0–0.5)
Eosinophils Relative: 0 %
HCT: 35.9 % — ABNORMAL LOW (ref 36.0–46.0)
Hemoglobin: 11.7 g/dL — ABNORMAL LOW (ref 12.0–15.0)
Immature Granulocytes: 1 %
Lymphocytes Relative: 8 %
Lymphs Abs: 0.5 10*3/uL — ABNORMAL LOW (ref 0.7–4.0)
MCH: 31.9 pg (ref 26.0–34.0)
MCHC: 32.6 g/dL (ref 30.0–36.0)
MCV: 97.8 fL (ref 80.0–100.0)
Monocytes Absolute: 0.9 10*3/uL (ref 0.1–1.0)
Monocytes Relative: 14 %
Neutro Abs: 5.1 10*3/uL (ref 1.7–7.7)
Neutrophils Relative %: 77 %
Platelets: 158 10*3/uL (ref 150–400)
RBC: 3.67 MIL/uL — ABNORMAL LOW (ref 3.87–5.11)
RDW: 15.8 % — ABNORMAL HIGH (ref 11.5–15.5)
WBC: 6.5 10*3/uL (ref 4.0–10.5)
nRBC: 0 % (ref 0.0–0.2)

## 2022-04-20 LAB — COMPREHENSIVE METABOLIC PANEL
ALT: 46 U/L — ABNORMAL HIGH (ref 0–44)
AST: 58 U/L — ABNORMAL HIGH (ref 15–41)
Albumin: 4.1 g/dL (ref 3.5–5.0)
Alkaline Phosphatase: 104 U/L (ref 38–126)
Anion gap: 8 (ref 5–15)
BUN: 12 mg/dL (ref 6–20)
CO2: 23 mmol/L (ref 22–32)
Calcium: 8.4 mg/dL — ABNORMAL LOW (ref 8.9–10.3)
Chloride: 105 mmol/L (ref 98–111)
Creatinine, Ser: 0.83 mg/dL (ref 0.44–1.00)
GFR, Estimated: >60 mL/min (ref >60)
Glucose, Bld: 108 mg/dL — ABNORMAL HIGH (ref 70–99)
Potassium: 3 mmol/L — ABNORMAL LOW (ref 3.5–5.1)
Sodium: 136 mmol/L (ref 135–145)
Total Bilirubin: 1.3 mg/dL — ABNORMAL HIGH (ref 0.3–1.2)
Total Protein: 6.5 g/dL (ref 6.5–8.1)

## 2022-04-20 MED ORDER — SODIUM CHLORIDE 0.9% FLUSH
10.0000 mL | Freq: Once | INTRAVENOUS | Status: AC
  Administered 2022-04-20: 10 mL via INTRAVENOUS
  Filled 2022-04-20: qty 10

## 2022-04-20 MED ORDER — HEPARIN SOD (PORK) LOCK FLUSH 100 UNIT/ML IV SOLN
500.0000 [IU] | Freq: Once | INTRAVENOUS | Status: AC
  Administered 2022-04-20: 500 [IU] via INTRAVENOUS
  Filled 2022-04-20: qty 5

## 2022-04-20 MED ORDER — CLONAZEPAM 0.5 MG PO TABS: 0.5000 mg | ORAL_TABLET | Freq: Three times a day (TID) | ORAL | 0 refills | Status: DC | PRN

## 2022-04-21 ENCOUNTER — Telehealth: Payer: Self-pay | Admitting: *Deleted

## 2022-04-21 NOTE — Telephone Encounter (Signed)
I spoke to pt yesterday and she wanted me to do another [employer] form and that she needs a list of visits for some of her issues she is still having for month of June and July. I sent the letter to her through my chart and I sent her a message that I faxed the [facility] papers and it went through.

## 2022-04-22 ENCOUNTER — Encounter: Payer: Self-pay | Admitting: Internal Medicine

## 2022-04-22 ENCOUNTER — Encounter: Payer: Self-pay | Admitting: Oncology

## 2022-04-24 ENCOUNTER — Other Ambulatory Visit: Payer: Self-pay

## 2022-04-27 ENCOUNTER — Other Ambulatory Visit: Payer: Self-pay

## 2022-05-01 ENCOUNTER — Telehealth: Payer: Self-pay | Admitting: Oncology

## 2022-05-01 ENCOUNTER — Other Ambulatory Visit: Payer: Self-pay | Admitting: *Deleted

## 2022-05-01 ENCOUNTER — Encounter: Payer: Self-pay | Admitting: *Deleted

## 2022-05-01 MED ORDER — DAPSONE 100 MG PO TABS: 100.0000 mg | ORAL_TABLET | Freq: Every day | ORAL | 1 refills | Status: DC

## 2022-05-01 NOTE — Telephone Encounter (Signed)
this patient called and asked me to cancel her 7/31 appointments. She said you were working to get her rescheduled. Please let me know when and I will set them up. Thank you

## 2022-05-03 ENCOUNTER — Other Ambulatory Visit: Payer: Self-pay | Admitting: Oncology

## 2022-05-04 ENCOUNTER — Inpatient Hospital Stay: Payer: No Typology Code available for payment source | Admitting: Oncology

## 2022-05-04 ENCOUNTER — Inpatient Hospital Stay: Payer: No Typology Code available for payment source

## 2022-05-05 ENCOUNTER — Encounter: Payer: Self-pay | Admitting: Oncology

## 2022-05-05 ENCOUNTER — Other Ambulatory Visit: Payer: Self-pay

## 2022-05-05 ENCOUNTER — Other Ambulatory Visit: Payer: Self-pay | Admitting: *Deleted

## 2022-05-05 ENCOUNTER — Inpatient Hospital Stay: Payer: Commercial Managed Care - HMO | Attending: Oncology

## 2022-05-05 ENCOUNTER — Inpatient Hospital Stay (HOSPITAL_BASED_OUTPATIENT_CLINIC_OR_DEPARTMENT_OTHER): Payer: Commercial Managed Care - HMO | Admitting: Oncology

## 2022-05-05 ENCOUNTER — Encounter: Payer: Self-pay | Admitting: Internal Medicine

## 2022-05-05 ENCOUNTER — Inpatient Hospital Stay: Payer: Commercial Managed Care - HMO

## 2022-05-05 VITALS — BP 115/60 | HR 78 | Temp 97.5°F | Resp 20 | Wt 215.5 lb

## 2022-05-05 DIAGNOSIS — Z809 Family history of malignant neoplasm, unspecified: Secondary | ICD-10-CM | POA: Insufficient documentation

## 2022-05-05 DIAGNOSIS — Z79899 Other long term (current) drug therapy: Secondary | ICD-10-CM | POA: Insufficient documentation

## 2022-05-05 DIAGNOSIS — C50812 Malignant neoplasm of overlapping sites of left female breast: Secondary | ICD-10-CM | POA: Diagnosis not present

## 2022-05-05 DIAGNOSIS — T451X5A Adverse effect of antineoplastic and immunosuppressive drugs, initial encounter: Secondary | ICD-10-CM

## 2022-05-05 DIAGNOSIS — K754 Autoimmune hepatitis: Secondary | ICD-10-CM

## 2022-05-05 DIAGNOSIS — G620 Drug-induced polyneuropathy: Secondary | ICD-10-CM | POA: Insufficient documentation

## 2022-05-05 DIAGNOSIS — Z95828 Presence of other vascular implants and grafts: Secondary | ICD-10-CM

## 2022-05-05 DIAGNOSIS — F419 Anxiety disorder, unspecified: Secondary | ICD-10-CM | POA: Diagnosis not present

## 2022-05-05 DIAGNOSIS — Z853 Personal history of malignant neoplasm of breast: Secondary | ICD-10-CM

## 2022-05-05 DIAGNOSIS — Z08 Encounter for follow-up examination after completed treatment for malignant neoplasm: Secondary | ICD-10-CM | POA: Diagnosis not present

## 2022-05-05 DIAGNOSIS — Z9013 Acquired absence of bilateral breasts and nipples: Secondary | ICD-10-CM | POA: Insufficient documentation

## 2022-05-05 LAB — COMPREHENSIVE METABOLIC PANEL
ALT: 29 U/L (ref 0–44)
AST: 40 U/L (ref 15–41)
Albumin: 4.3 g/dL (ref 3.5–5.0)
Alkaline Phosphatase: 85 U/L (ref 38–126)
Anion gap: 7 (ref 5–15)
BUN: 13 mg/dL (ref 6–20)
CO2: 26 mmol/L (ref 22–32)
Calcium: 8.8 mg/dL — ABNORMAL LOW (ref 8.9–10.3)
Chloride: 103 mmol/L (ref 98–111)
Creatinine, Ser: 0.73 mg/dL (ref 0.44–1.00)
GFR, Estimated: >60 mL/min (ref >60)
Glucose, Bld: 106 mg/dL — ABNORMAL HIGH (ref 70–99)
Potassium: 4 mmol/L (ref 3.5–5.1)
Sodium: 136 mmol/L (ref 135–145)
Total Bilirubin: 0.8 mg/dL (ref 0.3–1.2)
Total Protein: 6.4 g/dL — ABNORMAL LOW (ref 6.5–8.1)

## 2022-05-05 LAB — CBC WITH DIFFERENTIAL/PLATELET
Abs Immature Granulocytes: 0.09 10*3/uL — ABNORMAL HIGH (ref 0.00–0.07)
Basophils Absolute: 0 10*3/uL (ref 0.0–0.1)
Basophils Relative: 0 %
Eosinophils Absolute: 0 10*3/uL (ref 0.0–0.5)
Eosinophils Relative: 0 %
HCT: 35.2 % — ABNORMAL LOW (ref 36.0–46.0)
Hemoglobin: 11.4 g/dL — ABNORMAL LOW (ref 12.0–15.0)
Immature Granulocytes: 1 %
Lymphocytes Relative: 3 %
Lymphs Abs: 0.3 10*3/uL — ABNORMAL LOW (ref 0.7–4.0)
MCH: 31.8 pg (ref 26.0–34.0)
MCHC: 32.4 g/dL (ref 30.0–36.0)
MCV: 98.1 fL (ref 80.0–100.0)
Monocytes Absolute: 1 10*3/uL (ref 0.1–1.0)
Monocytes Relative: 9 %
Neutro Abs: 9.5 10*3/uL — ABNORMAL HIGH (ref 1.7–7.7)
Neutrophils Relative %: 87 %
Platelets: 170 10*3/uL (ref 150–400)
RBC: 3.59 MIL/uL — ABNORMAL LOW (ref 3.87–5.11)
RDW: 14.7 % (ref 11.5–15.5)
WBC: 10.9 10*3/uL — ABNORMAL HIGH (ref 4.0–10.5)
nRBC: 0 % (ref 0.0–0.2)

## 2022-05-05 MED ORDER — SODIUM CHLORIDE 0.9% FLUSH
10.0000 mL | Freq: Once | INTRAVENOUS | Status: AC
  Administered 2022-05-05: 10 mL via INTRAVENOUS
  Filled 2022-05-05: qty 10

## 2022-05-05 MED ORDER — HEPARIN SOD (PORK) LOCK FLUSH 100 UNIT/ML IV SOLN
500.0000 [IU] | Freq: Once | INTRAVENOUS | Status: AC
  Administered 2022-05-05: 500 [IU] via INTRAVENOUS
  Filled 2022-05-05: qty 5

## 2022-05-05 NOTE — Progress Notes (Signed)
Hematology/Oncology Consult note [facility]  Telephone:([phone] [phone]  Patient Care Team: Juluis Pitch, MD as PCP - General (Family Medicine) Sindy Guadeloupe, MD as Consulting Physician (Hematology and Oncology) Dillingham, Loel Lofty, DO as Consulting Physician (Plastic Surgery) Noreene Filbert, MD as Consulting Physician (Radiation Oncology) Herbert Pun, MD as Consulting Physician (General Surgery)   Name of the patient: Vickie Mathis  [id number]  1990/12/05   Date of visit: 05/05/22  Diagnosis- locally advanced 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 breast cancer at least T2 N1 M0    Chief complaint/ Reason for visit- routine f/u of autoimmune hepatitis  Heme/Onc history: patient is a 31 year old female who self palpated a left breast mass and sought medical attention recently after thinking it was a possible cyst for all this while.She underwent a diagnostic bilateral mammogram and ultrasound which showed a 2.7 cm mass at the 12 o'clock position 6 cm from the nipple.  Ultrasound of the left axilla demonstrates 2 lymph nodes with mild thickened cortices of 4 mm.  There is skin thickening on the lower inner quadrant of the left breast on mammography.  Both the breast mass and the lymph node were biopsied and was positive for invasive mammary carcinoma grade 3.  Lymph node was also suspicious for extracapsular extension.    ER/PR and HER-2 negative   MRI showed irregular enhancing mass at the 12 o'clock position of the left breast measuring 2.5 x 2.3 cm and a linear component extending 2.3 cm anteriorly.  The mass in the anterior linear extension combined measure 4.3 cm.  3.9 cm in the cephalocaudal dimension.  Also an area of clumped non-mass enhancement in the posterior aspect of the lower quadrant of the left breast measuring 2.6 x 0.9 cm which looks suspicious.  2 enlarged left axillary lymph nodes and 2 mildly enlarged internal mammary lymph  nodes.  4.3 cm clumped area of non-mass enhancement in the outer quadrant of the right breast.  3 right axillary lymph nodes with mild cortical thickening.   Patient underwent biopsy of the right breast non-mass enhancement and that was negative for malignancy   CT scan showed mildly enlarged right inguinal lymph nodes nonspecific.  Left upper breast mass with prominent left axillary lymph nodes.  Subcentimeter pulmonary nodules which are calcified and compatible with benign old granulomatous disease.  0.6 5.4 cm lucent lesion in the left first rib possibly a hemangioma or benign lesion.   Patient developed significant infusion reaction to carboplatin with dose 9 when her blood pressure dropped and she became tachycardic and significantly nauseous.  She went on to complete [facility] Keytruda chemotherapy as per keynote 522 regimen.  Patient underwent bilateral mastectomy with reconstruction in September 2022.  Final pathology showed complete pathological response2 sentinel lymph nodes negative for malignancy.  No malignancy noted in the right breast.   Plan was to complete 9 cycles of adjuvant Keytruda.  Patient noted to have abnormal LFTs on Keytruda.  Last dose of Keytruda given on 10/08/2021.  Subsequently her LFTs were significantly elevated and patient did not receive any further Keytruda.  She was admitted to [facility] for grade 4 hepatitis likely secondary to [facility] and was started on Solu-Medrol and is currently on prednisone taper and mycophenolate     Interval history-continues to have problems with anxiety: She is on clonazepam buspirone as well as duloxetine.  States that she is not currently on gabapentin although she does report worsening neuropathy especially at night. She reports occasional  pain over left chest wall. She is using 1-2 oxycodone per day  ECOG PS- 1 Pain scale- 0   Review of systems- Review of Systems  Constitutional:  Positive for malaise/fatigue. Negative for chills, fever and  weight loss.  HENT:  Negative for congestion, ear discharge and nosebleeds.   Eyes:  Negative for blurred vision.  Respiratory:  Negative for cough, hemoptysis, sputum production, shortness of breath and wheezing.   Cardiovascular:  Negative for chest pain, palpitations, orthopnea and claudication.  Gastrointestinal:  Negative for abdominal pain, blood in stool, constipation, diarrhea, heartburn, melena, nausea and vomiting.  Genitourinary:  Negative for dysuria, flank pain, frequency, hematuria and urgency.  Musculoskeletal:  Negative for back pain, joint pain and myalgias.  Skin:  Negative for rash.  Neurological:  Positive for sensory change (peripheral neuropathy). Negative for dizziness, tingling, focal weakness, seizures, weakness and headaches.  Endo/Heme/Allergies:  Does not bruise/bleed easily.  Psychiatric/Behavioral:  Negative for depression and suicidal ideas. The patient is nervous/anxious. The patient does not have insomnia.       Allergies  Allergen Reactions   Carboplatin Shortness Of Breath, Nausea And Vomiting and Other (See Comments)    Flushing- chest , face, neck , arm including hand   Amoxicillin     Other reaction(s): "too young to remember what they do to me"   Sulfa Antibiotics     Other reaction(s): "too young to remember what they do to me"     Past Medical History:  Diagnosis Date   Anxiety    Asthma    Breast cancer ([place]) 11/2020   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 breast ca   Depression    Family history of cancer    History of chemotherapy    Varicose veins of bilateral lower extremities with pain      Past Surgical History:  Procedure Laterality Date   APPENDECTOMY  2018   BILATERAL TOTAL MASTECTOMY WITH AXILLARY LYMPH NODE DISSECTION Bilateral 06/25/2021   Procedure: BILATERAL TOTAL MASTECTOMY WITH LEFT AXILLARY LYMPH NODE BIOPSY VS. AXILLARY NODE DISSECTION;  Surgeon: Herbert Pun, MD;  Location: [facility] ORS;  Service: General;  Laterality:  Bilateral;  Dillingham, 1.5 hours Cintron-Diaz 2.5 hours   BREAST BIOPSY Left 11/26/2020   vision 12:00 6cmfn [facility]   BREAST BIOPSY Left 11/26/2020   LN bx, hydro marker,  fragments of macrometastatic carcinoma   BREAST RECONSTRUCTION WITH PLACEMENT OF TISSUE EXPANDER AND FLEX HD (ACELLULAR HYDRATED DERMIS) Bilateral 06/25/2021   Procedure: BREAST RECONSTRUCTION WITH PLACEMENT OF TISSUE EXPANDER AND FLEX HD (ACELLULAR HYDRATED DERMIS);  Surgeon: Wallace Going, DO;  Location: [facility] ORS;  Service: Plastics;  Laterality: Bilateral;   PORTACATH PLACEMENT Right 12/13/2020   Procedure: INSERTION PORT-A-CATH;  Surgeon: Herbert Pun, MD;  Location: [facility] ORS;  Service: General;  Laterality: Right;   REMOVAL OF BILATERAL TISSUE EXPANDERS WITH PLACEMENT OF BILATERAL BREAST IMPLANTS Bilateral 09/15/2021   Procedure: REMOVAL OF BILATERAL TISSUE EXPANDERS;  Surgeon: Wallace Going, DO;  Location: [place];  Service: Plastics;  Laterality: Bilateral;    Social History   Socioeconomic History   Marital status: Married    Spouse name: Not on file   Number of children: Not on file   Years of education: Not on file   Highest education level: Not on file  Occupational History   Not on file  Tobacco Use   Smoking status: Never   Smokeless tobacco: Never  Vaping Use   Vaping Use: Never used  Substance and Sexual Activity  Alcohol use: Not Currently    Comment: occassinally    Drug use: Yes    Types: Marijuana    Comment: occ   Sexual activity: Yes  Other Topics Concern   Not on file  Social History Narrative   Not on file   Social Determinants of Health   Financial Resource Strain: Not on file  Food Insecurity: Not on file  Transportation Needs: No Transportation Needs (03/09/2022)   PRAPARE - Hydrologist (Medical): No    Lack of Transportation (Non-Medical): No  Physical Activity: Not on file  Stress: Not on file  Social  Connections: Not on file  Intimate Partner Violence: Not on file    Family History  Problem Relation Age of Onset   Diabetes Father    Varicose Veins Father    Diabetes Paternal Aunt    Uterine cancer Paternal Aunt        precancerous   Diabetes Paternal Grandmother    Uterine cancer Paternal Grandmother        precancerous   Thyroid disease Mother    Thyroid disease Maternal Aunt    Thyroid disease Maternal Grandmother    Cancer Other        stomach vs ovarian/cervical   Cancer Paternal Great-grandmother        unk     Current Outpatient Medications:    busPIRone (BUSPAR) 15 MG tablet, Take 1 tablet by mouth 2 (two) times daily., Disp: , Rfl:    citalopram (CELEXA) 20 MG tablet, TAKE 1 TABLET BY MOUTH EVERY DAY, Disp: 90 tablet, Rfl: 1   clonazePAM (KLONOPIN) 0.5 MG tablet, Take 1 tablet (0.5 mg total) by mouth 3 (three) times daily as needed., Disp: 60 tablet, Rfl: 0   dapsone 100 MG tablet, Take 1 tablet (100 mg total) by mouth daily., Disp: 30 tablet, Rfl: 1   docusate sodium (COLACE) 100 MG capsule, Take 100 mg by mouth daily as needed for mild constipation., Disp: , Rfl:    Multiple Vitamin (MULTIVITAMIN WITH MINERALS) TABS tablet, Take 1 tablet by mouth daily., Disp: , Rfl:    mycophenolate (CELLCEPT) 250 MG capsule, Take by mouth., Disp: , Rfl:    ondansetron (ZOFRAN) 8 MG tablet, Take 1 tablet (8 mg total) by mouth every 8 (eight) hours as needed for refractory nausea / vomiting. Start on day 3 after chemo., Disp: 60 tablet, Rfl: 3   oxyCODONE (OXY IR/ROXICODONE) 5 MG immediate release tablet, Take 1 tablet (5 mg total) by mouth 2 (two) times daily., Disp: 60 tablet, Rfl: 0   pantoprazole (PROTONIX) 40 MG tablet, Take 40 mg by mouth daily., Disp: , Rfl:    predniSONE (DELTASONE) 20 MG tablet, Take 4 tablets (80 mg total) by mouth daily with breakfast., Disp: 48 tablet, Rfl: 0   TURMERIC CURCUMIN PO, Take 2 capsules by mouth daily., Disp: , Rfl:    cholestyramine light  (PREVALITE) 4 g packet, TAKE 1 PACKET (4 G TOTAL) BY MOUTH 2 TIMES DAILY. (Patient not taking: Reported on 05/05/2022), Disp: 90 packet, Rfl: 0   Cyanocobalamin (B-12 PO), Take 1 tablet by mouth daily. (Patient not taking: Reported on 05/05/2022), Disp: , Rfl:    folic acid (FOLVITE) 1 MG tablet, TAKE 2 TABLETS BY MOUTH EVERY DAY (Patient not taking: Reported on 05/05/2022), Disp: 180 tablet, Rfl: 0   gabapentin (NEURONTIN) 300 MG capsule, Take 1 capsule (300 mg total) by mouth 3 (three) times daily. (Patient not taking: Reported on  05/05/2022), Disp: 42 capsule, Rfl: 3   hydrOXYzine (VISTARIL) 25 MG capsule, TAKE 1 CAPSULE (25 MG TOTAL) BY MOUTH AT BEDTIME AS NEEDED FOR UP TO 20 DOSES. (Patient not taking: Reported on 12/22/2021), Disp: 20 capsule, Rfl: 0   lidocaine-prilocaine (EMLA) cream, Apply 1 application topically daily as needed (prior to port access)., Disp: 30 g, Rfl: 3   methocarbamol (ROBAXIN) 500 MG tablet, TAKE 1 TABLET BY MOUTH 3 TIMES DAILY FOR 14 DAYS. (Patient not taking: Reported on 04/06/2022), Disp: 42 tablet, Rfl: 0   ondansetron (ZOFRAN ODT) 4 MG disintegrating tablet, Take 1 tablet (4 mg total) by mouth every 8 (eight) hours as needed for nausea or vomiting. (Patient not taking: Reported on 05/05/2022), Disp: 30 tablet, Rfl: 2   ondansetron (ZOFRAN-ODT) 4 MG disintegrating tablet, Take 1 tablet (4 mg total) by mouth every 8 (eight) hours as needed for nausea or vomiting. (Patient not taking: Reported on 01/19/2022), Disp: 20 tablet, Rfl: 0   potassium chloride SA (KLOR-CON M) 20 MEQ tablet, Take 1 tablet (20 mEq total) by mouth daily. (Patient not taking: Reported on 01/19/2022), Disp: 7 tablet, Rfl: 0   traZODone (DESYREL) 50 MG tablet, TAKE 0.5 TABLETS BY MOUTH AT BEDTIME AS NEEDED FOR SLEEP. (Patient not taking: Reported on 02/23/2022), Disp: 7 tablet, Rfl: 0   triamcinolone ointment (KENALOG) 0.5 %, Apply 1 application topically 2 (two) times daily. (Patient not taking: Reported on 02/06/2022),  Disp: 30 g, Rfl: 0 No current facility-administered medications for this visit.  Facility-Administered Medications Ordered in Other Visits:    prochlorperazine (COMPAZINE) tablet 10 mg, 10 mg, Oral, Q6H PRN, Sindy Guadeloupe, MD, 10 mg at 07/16/21 1407  Physical exam:  Vitals:   05/05/22 1353  BP: 115/60  Pulse: 78  Resp: 20  Temp: (!) 97.5 F (36.4 C)  SpO2: 96%  Weight: 215 lb 8 oz (97.8 kg)   Physical Exam Constitutional:      General: She is not in acute distress. Cardiovascular:     Rate and Rhythm: Normal rate and regular rhythm.     Heart sounds: Normal heart sounds.  Pulmonary:     Effort: Pulmonary effort is normal.  Skin:    General: Skin is warm and dry.  Neurological:     Mental Status: She is alert and oriented to person, place, and time.         Latest Ref Rng & Units 05/05/2022    1:04 PM  CMP  Glucose 70 - 99 mg/dL 106   BUN 6 - 20 mg/dL 13   Creatinine 0.44 - 1.00 mg/dL 0.73   Sodium 135 - 145 mmol/L 136   Potassium 3.5 - 5.1 mmol/L 4.0   Chloride 98 - 111 mmol/L 103   CO2 22 - 32 mmol/L 26   Calcium 8.9 - 10.3 mg/dL 8.8   Total Protein 6.5 - 8.1 g/dL 6.4   Total Bilirubin 0.3 - 1.2 mg/dL 0.8   Alkaline Phos 38 - 126 U/L 85   AST 15 - 41 U/L 40   ALT 0 - 44 U/L 29       Latest Ref Rng & Units 05/05/2022    1:04 PM  CBC  WBC 4.0 - 10.5 K/uL 10.9   Hemoglobin 12.0 - 15.0 g/dL 11.4   Hematocrit 36.0 - 46.0 % 35.2   Platelets 150 - 400 K/uL 170     Assessment and plan- Patient is a 31 y.o. female here for follow-up of following issues:  History of breast cancer: S/p neoadjuvant chemotherapy followed by pathological complete response at the time of surgery.  She was being given adjuvant Keytruda when she developed autoimmune hepatitis and stopped after 6 cycles of adjuvant Keytruda.  She is s/p bilateral mastectomy.  Implants had to be removed following infection.  She is not interested in any reconstruction at this time.  I suspect her chest wall  pain is probably secondary to lymphedema.  On physical exam I do not appreciate any palpable masses.  If pain continues to worsen I will consider getting a CT chest at that time.  She is also following up with Luna Fuse for her lymphedema.  Chemo-induced peripheral neuropathy: I would not like the patient to remain on any long-term narcotics.  I explained to the patient that she would slowly wean herself off oxycodone and come off narcotics in 3 months.  It would be okay for patient to stay on gabapentin in fact I wanted to restart gabapentin at this time for her neuropathy and I will be refilling the prescription for the same.   Anxiety: This has been a chronic issue and she has been on clonazepam and buspirone as well as duloxetine even prior to starting chemotherapy.  I would like her anxiety management to be now rolled over again to her primary care doctor since her acute issues have resolved at this time.  Autoimmune hepatitis: LFTs today are normal.  Patient is on mycophenolate 1250 twice daily and 7.5 mg of prednisone.  I will be reaching out to Dr. Merrilee Jansky for further management of her immunosuppressants.  She will also continue to remain on dapsone until she comes off immunosuppression.  I will see her back in 3 months with labs and continue to have port in place   Visit Diagnosis 1. Chemotherapy-induced peripheral neuropathy ([place])   2. Autoimmune hepatitis ([place])   3. Encounter for follow-up surveillance of breast cancer   4. Anxiety      Dr. Randa Evens, MD, MPH [facility] [phone] 05/05/2022 7:31 PM

## 2022-05-05 NOTE — Progress Notes (Signed)
Pt will like her breast area looked at due to being uncomfortable and causing some pain at the site.

## 2022-05-06 ENCOUNTER — Other Ambulatory Visit: Payer: Self-pay

## 2022-05-06 ENCOUNTER — Encounter: Payer: Self-pay | Admitting: *Deleted

## 2022-05-08 ENCOUNTER — Other Ambulatory Visit: Payer: Self-pay | Admitting: Oncology

## 2022-05-08 MED ORDER — GABAPENTIN 300 MG PO CAPS: 300.0000 mg | ORAL_CAPSULE | Freq: Three times a day (TID) | ORAL | 3 refills | Status: AC

## 2022-05-08 MED ORDER — OXYCODONE HCL 5 MG PO TABS
5.0000 mg | ORAL_TABLET | Freq: Two times a day (BID) | ORAL | 0 refills | Status: DC
Start: 2022-05-08 — End: 2022-06-07

## 2022-05-16 ENCOUNTER — Other Ambulatory Visit: Payer: Self-pay

## 2022-05-18 ENCOUNTER — Other Ambulatory Visit: Payer: Self-pay | Admitting: *Deleted

## 2022-05-18 ENCOUNTER — Encounter: Payer: Self-pay | Admitting: *Deleted

## 2022-05-18 NOTE — Progress Notes (Signed)
To Whom it may concern at [employer]:  Vickie Mathis is still on the Mycophenolate and prednisone but the prednisone has decreased and that is for her transaminitis. She comes every two weeks to get labs and that determines if we change doses of the above medications.  She also has side effects of being on the medications; such as sometimes has blood pressure drops, and intolerant to heat. She still has the foraminal stenosis and the prednisone, and neuropathy  that still interferes with arms and hands. The fingers are numb most of the time. She has been getting physical therapy to help with the

## 2022-05-19 ENCOUNTER — Inpatient Hospital Stay: Payer: Commercial Managed Care - HMO

## 2022-05-19 ENCOUNTER — Encounter: Payer: Self-pay | Admitting: *Deleted

## 2022-05-19 DIAGNOSIS — K754 Autoimmune hepatitis: Secondary | ICD-10-CM

## 2022-05-19 DIAGNOSIS — C50812 Malignant neoplasm of overlapping sites of left female breast: Secondary | ICD-10-CM | POA: Diagnosis not present

## 2022-05-19 LAB — CBC WITH DIFFERENTIAL/PLATELET
Abs Immature Granulocytes: 0.03 10*3/uL (ref 0.00–0.07)
Basophils Absolute: 0 10*3/uL (ref 0.0–0.1)
Basophils Relative: 0 %
Eosinophils Absolute: 0 10*3/uL (ref 0.0–0.5)
Eosinophils Relative: 0 %
HCT: 34.1 % — ABNORMAL LOW (ref 36.0–46.0)
Hemoglobin: 11.2 g/dL — ABNORMAL LOW (ref 12.0–15.0)
Immature Granulocytes: 1 %
Lymphocytes Relative: 7 %
Lymphs Abs: 0.4 10*3/uL — ABNORMAL LOW (ref 0.7–4.0)
MCH: 33.5 pg (ref 26.0–34.0)
MCHC: 32.8 g/dL (ref 30.0–36.0)
MCV: 102.1 fL — ABNORMAL HIGH (ref 80.0–100.0)
Monocytes Absolute: 0.7 10*3/uL (ref 0.1–1.0)
Monocytes Relative: 13 %
Neutro Abs: 4.4 10*3/uL (ref 1.7–7.7)
Neutrophils Relative %: 79 %
Platelets: 140 10*3/uL — ABNORMAL LOW (ref 150–400)
RBC: 3.34 MIL/uL — ABNORMAL LOW (ref 3.87–5.11)
RDW: 15.2 % (ref 11.5–15.5)
WBC: 5.5 10*3/uL (ref 4.0–10.5)
nRBC: 0 % (ref 0.0–0.2)

## 2022-05-19 LAB — COMPREHENSIVE METABOLIC PANEL
ALT: 35 U/L (ref 0–44)
AST: 47 U/L — ABNORMAL HIGH (ref 15–41)
Albumin: 4.1 g/dL (ref 3.5–5.0)
Alkaline Phosphatase: 75 U/L (ref 38–126)
Anion gap: 6 (ref 5–15)
BUN: 16 mg/dL (ref 6–20)
CO2: 24 mmol/L (ref 22–32)
Calcium: 8.6 mg/dL — ABNORMAL LOW (ref 8.9–10.3)
Chloride: 106 mmol/L (ref 98–111)
Creatinine, Ser: 0.76 mg/dL (ref 0.44–1.00)
GFR, Estimated: >60 mL/min (ref >60)
Glucose, Bld: 96 mg/dL (ref 70–99)
Potassium: 3.6 mmol/L (ref 3.5–5.1)
Sodium: 136 mmol/L (ref 135–145)
Total Bilirubin: 1 mg/dL (ref 0.3–1.2)
Total Protein: 6.5 g/dL (ref 6.5–8.1)

## 2022-05-21 ENCOUNTER — Other Ambulatory Visit: Payer: Self-pay | Admitting: Oncology

## 2022-05-22 ENCOUNTER — Encounter: Payer: Self-pay | Admitting: *Deleted

## 2022-05-22 ENCOUNTER — Other Ambulatory Visit: Payer: Self-pay | Admitting: *Deleted

## 2022-05-23 ENCOUNTER — Encounter: Payer: Self-pay | Admitting: Oncology

## 2022-05-23 ENCOUNTER — Encounter: Payer: Self-pay | Admitting: Internal Medicine

## 2022-05-23 MED ORDER — CLONAZEPAM 0.5 MG PO TABS: 0.5000 mg | ORAL_TABLET | Freq: Three times a day (TID) | ORAL | 0 refills | Status: DC | PRN

## 2022-05-29 ENCOUNTER — Ambulatory Visit: Payer: 59 | Admitting: Plastic Surgery

## 2022-05-31 ENCOUNTER — Other Ambulatory Visit: Payer: Self-pay | Admitting: Oncology

## 2022-06-01 ENCOUNTER — Encounter: Payer: Self-pay | Admitting: Oncology

## 2022-06-01 ENCOUNTER — Encounter: Payer: Self-pay | Admitting: Internal Medicine

## 2022-06-02 ENCOUNTER — Other Ambulatory Visit: Payer: Self-pay | Admitting: *Deleted

## 2022-06-02 ENCOUNTER — Inpatient Hospital Stay: Payer: Commercial Managed Care - HMO

## 2022-06-02 ENCOUNTER — Encounter: Payer: Self-pay | Admitting: *Deleted

## 2022-06-02 DIAGNOSIS — R7401 Elevation of levels of liver transaminase levels: Secondary | ICD-10-CM

## 2022-06-02 DIAGNOSIS — C50812 Malignant neoplasm of overlapping sites of left female breast: Secondary | ICD-10-CM | POA: Diagnosis not present

## 2022-06-02 DIAGNOSIS — Z95828 Presence of other vascular implants and grafts: Secondary | ICD-10-CM

## 2022-06-02 DIAGNOSIS — C50412 Malignant neoplasm of upper-outer quadrant of left female breast: Secondary | ICD-10-CM

## 2022-06-02 LAB — CBC WITH DIFFERENTIAL/PLATELET
Abs Immature Granulocytes: 0.02 10*3/uL (ref 0.00–0.07)
Basophils Absolute: 0 10*3/uL (ref 0.0–0.1)
Basophils Relative: 0 %
Eosinophils Absolute: 0 10*3/uL (ref 0.0–0.5)
Eosinophils Relative: 0 %
HCT: 36.4 % (ref 36.0–46.0)
Hemoglobin: 12.2 g/dL (ref 12.0–15.0)
Immature Granulocytes: 0 %
Lymphocytes Relative: 10 %
Lymphs Abs: 0.6 10*3/uL — ABNORMAL LOW (ref 0.7–4.0)
MCH: 33.2 pg (ref 26.0–34.0)
MCHC: 33.5 g/dL (ref 30.0–36.0)
MCV: 98.9 fL (ref 80.0–100.0)
Monocytes Absolute: 0.7 10*3/uL (ref 0.1–1.0)
Monocytes Relative: 11 %
Neutro Abs: 4.8 10*3/uL (ref 1.7–7.7)
Neutrophils Relative %: 79 %
Platelets: 208 10*3/uL (ref 150–400)
RBC: 3.68 MIL/uL — ABNORMAL LOW (ref 3.87–5.11)
RDW: 12.9 % (ref 11.5–15.5)
WBC: 6.1 10*3/uL (ref 4.0–10.5)
nRBC: 0 % (ref 0.0–0.2)

## 2022-06-02 LAB — COMPREHENSIVE METABOLIC PANEL
ALT: 31 U/L (ref 0–44)
AST: 38 U/L (ref 15–41)
Albumin: 4.3 g/dL (ref 3.5–5.0)
Alkaline Phosphatase: 89 U/L (ref 38–126)
Anion gap: 6 (ref 5–15)
BUN: 8 mg/dL (ref 6–20)
CO2: 25 mmol/L (ref 22–32)
Calcium: 8.8 mg/dL — ABNORMAL LOW (ref 8.9–10.3)
Chloride: 104 mmol/L (ref 98–111)
Creatinine, Ser: 0.67 mg/dL (ref 0.44–1.00)
GFR, Estimated: >60 mL/min (ref >60)
Glucose, Bld: 82 mg/dL (ref 70–99)
Potassium: 3.6 mmol/L (ref 3.5–5.1)
Sodium: 135 mmol/L (ref 135–145)
Total Bilirubin: 0.5 mg/dL (ref 0.3–1.2)
Total Protein: 6.8 g/dL (ref 6.5–8.1)

## 2022-06-02 MED ORDER — SODIUM CHLORIDE 0.9% FLUSH
10.0000 mL | Freq: Once | INTRAVENOUS | Status: AC
  Administered 2022-06-02: 10 mL via INTRAVENOUS
  Filled 2022-06-02: qty 10

## 2022-06-02 MED ORDER — HEPARIN SOD (PORK) LOCK FLUSH 100 UNIT/ML IV SOLN
500.0000 [IU] | Freq: Once | INTRAVENOUS | Status: AC
  Administered 2022-06-02: 500 [IU] via INTRAVENOUS
  Filled 2022-06-02: qty 5

## 2022-06-05 ENCOUNTER — Encounter: Payer: Self-pay | Admitting: Oncology

## 2022-06-05 ENCOUNTER — Ambulatory Visit (INDEPENDENT_AMBULATORY_CARE_PROVIDER_SITE_OTHER): Payer: Commercial Managed Care - HMO | Admitting: Plastic Surgery

## 2022-06-05 ENCOUNTER — Other Ambulatory Visit: Payer: Self-pay

## 2022-06-05 ENCOUNTER — Encounter: Payer: Self-pay | Admitting: Internal Medicine

## 2022-06-05 DIAGNOSIS — C50912 Malignant neoplasm of unspecified site of left female breast: Secondary | ICD-10-CM

## 2022-06-05 DIAGNOSIS — Z9013 Acquired absence of bilateral breasts and nipples: Secondary | ICD-10-CM | POA: Diagnosis not present

## 2022-06-05 DIAGNOSIS — C50919 Malignant neoplasm of unspecified site of unspecified female breast: Secondary | ICD-10-CM

## 2022-06-06 ENCOUNTER — Other Ambulatory Visit: Payer: Self-pay

## 2022-06-07 ENCOUNTER — Other Ambulatory Visit: Payer: Self-pay | Admitting: Hospice and Palliative Medicine

## 2022-06-09 ENCOUNTER — Telehealth: Payer: Self-pay | Admitting: *Deleted

## 2022-06-09 ENCOUNTER — Encounter: Payer: Self-pay | Admitting: Internal Medicine

## 2022-06-09 ENCOUNTER — Encounter: Payer: Self-pay | Admitting: Oncology

## 2022-06-09 MED ORDER — OXYCODONE HCL 5 MG PO TABS: 5.0000 mg | ORAL_TABLET | Freq: Two times a day (BID) | ORAL | 0 refills | Status: AC

## 2022-06-09 NOTE — Telephone Encounter (Signed)
Patient called stating pharmacy told her that we need to call her insurance company before they can fill her Oxycodone. She is asking if we will please call them then call her and let her know we did it

## 2022-06-09 NOTE — Telephone Encounter (Signed)
The pharmacy let me have the number to call and it was [phone]. I called it and spoke to [facility]. She took the info and put me on hold and then came back and said she will fax a sheet to feel out and they will need notes from MD about her pain.

## 2022-06-09 NOTE — Telephone Encounter (Signed)
-----   Message from Secundino Ginger sent at 06/09/2022 11:05 AM EDT ----- Regarding: PRIOR AUTHORIZATION STARTED- [facility] PRIOR AUTHORIZATION STARTED- [facility] SENT TO HER CHART.

## 2022-06-11 ENCOUNTER — Encounter: Payer: Self-pay | Admitting: *Deleted

## 2022-06-11 ENCOUNTER — Encounter: Payer: Self-pay | Admitting: Internal Medicine

## 2022-06-11 ENCOUNTER — Encounter: Payer: Self-pay | Admitting: Oncology

## 2022-06-11 NOTE — Progress Notes (Signed)
   Subjective:    Patient ID: Vickie Mathis, female    DOB: 1991/04/22, 31 y.o.   MRN: [id number]  HPI The patient is an amazing lady who was treated for invasive mammary carcinoma that was triple negative.  She originally felt a left breast mass in 2021.  She underwent bilateral mastectomies 2022 with expander placement.  She continued to receive chemotherapy postoperatively which was complicated by reactions and adjusted as needed. She had complications which lead to removal of the expanders 12/22. She was seen by Dr. Janese Banks 05/05/22 who indicates she has locally advanced 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 breast cancer T2N1M0. She had a grade 4 hepatitis possibly secondary to [place] and was treated at [facility]. She was started on solu-medrol and switched to prednisone taper and mycophenolate. She is being treated for the anxiety with clonazepam, buspirone and duloxetine.  Her neuropathy has not improved.  Dr. Janese Banks recommend restarting the gabapentin and weaning off the narcotics. She plans to follow up with Dr. Janese Banks in three months.  She is discouraged about her appearance and would like to have breast reconstruction.  She understands that we will need to wait until the other above issues are resolved.  Skin is in good condition.  No sign of infection or issues at the breast.    Review of Systems  Constitutional:  Positive for activity change, appetite change and fatigue.  Eyes: Negative.   Respiratory: Negative.    Cardiovascular: Negative.   Gastrointestinal: Negative.   Endocrine: Negative.   Genitourinary: Negative.   Hematological:  Positive for adenopathy.       Objective:   Physical Exam Constitutional:      Appearance: Normal appearance.  HENT:     Head: Normocephalic.  Cardiovascular:     Rate and Rhythm: Normal rate.  Pulmonary:     Effort: Pulmonary effort is normal.  Abdominal:     General: There is no distension.     Palpations: Abdomen is soft.     Tenderness: There is no abdominal  tenderness.  Musculoskeletal:        General: Tenderness present. No swelling.  Skin:    General: Skin is warm.     Coloration: Skin is not jaundiced.     Findings: No bruising or lesion.  Neurological:     Mental Status: She is alert and oriented to person, place, and time.  Psychiatric:        Mood and Affect: Mood normal.        Behavior: Behavior normal.        Thought Content: Thought content normal.        Judgment: Judgment normal.       Assessment & Plan:     ICD-10-CM   1. Invasive carcinoma of breast [facility])  C50.919        Patient is still a candidate for further discussion for a DIEP flap which would be at [facility]. We will plan to see her back in 3 months.  I encourage increasing protein intact as able.  I also tried to encourage her as she is young and still a candidate for reconstruction.

## 2022-06-17 ENCOUNTER — Inpatient Hospital Stay: Payer: Commercial Managed Care - HMO | Attending: Oncology

## 2022-06-17 ENCOUNTER — Inpatient Hospital Stay: Payer: Commercial Managed Care - HMO

## 2022-06-17 DIAGNOSIS — K754 Autoimmune hepatitis: Secondary | ICD-10-CM | POA: Insufficient documentation

## 2022-06-17 DIAGNOSIS — Z171 Estrogen receptor negative status [ER-]: Secondary | ICD-10-CM

## 2022-06-17 DIAGNOSIS — R7401 Elevation of levels of liver transaminase levels: Secondary | ICD-10-CM

## 2022-06-17 LAB — CBC WITH DIFFERENTIAL/PLATELET
Abs Immature Granulocytes: 0.02 10*3/uL (ref 0.00–0.07)
Basophils Absolute: 0 10*3/uL (ref 0.0–0.1)
Basophils Relative: 0 %
Eosinophils Absolute: 0 10*3/uL (ref 0.0–0.5)
Eosinophils Relative: 0 %
HCT: 38.4 % (ref 36.0–46.0)
Hemoglobin: 13 g/dL (ref 12.0–15.0)
Immature Granulocytes: 0 %
Lymphocytes Relative: 10 %
Lymphs Abs: 0.7 10*3/uL (ref 0.7–4.0)
MCH: 31.3 pg (ref 26.0–34.0)
MCHC: 33.9 g/dL (ref 30.0–36.0)
MCV: 92.5 fL (ref 80.0–100.0)
Monocytes Absolute: 0.8 10*3/uL (ref 0.1–1.0)
Monocytes Relative: 11 %
Neutro Abs: 5.6 10*3/uL (ref 1.7–7.7)
Neutrophils Relative %: 79 %
Platelets: 199 10*3/uL (ref 150–400)
RBC: 4.15 MIL/uL (ref 3.87–5.11)
RDW: 13.1 % (ref 11.5–15.5)
WBC: 7.1 10*3/uL (ref 4.0–10.5)
nRBC: 0 % (ref 0.0–0.2)

## 2022-06-17 LAB — COMPREHENSIVE METABOLIC PANEL
ALT: 26 U/L (ref 0–44)
AST: 34 U/L (ref 15–41)
Albumin: 4.3 g/dL (ref 3.5–5.0)
Alkaline Phosphatase: 75 U/L (ref 38–126)
Anion gap: 6 (ref 5–15)
BUN: 7 mg/dL (ref 6–20)
CO2: 23 mmol/L (ref 22–32)
Calcium: 9 mg/dL (ref 8.9–10.3)
Chloride: 109 mmol/L (ref 98–111)
Creatinine, Ser: 0.59 mg/dL (ref 0.44–1.00)
GFR, Estimated: >60 mL/min (ref >60)
Glucose, Bld: 98 mg/dL (ref 70–99)
Potassium: 3.2 mmol/L — ABNORMAL LOW (ref 3.5–5.1)
Sodium: 138 mmol/L (ref 135–145)
Total Bilirubin: 0.6 mg/dL (ref 0.3–1.2)
Total Protein: 6.8 g/dL (ref 6.5–8.1)

## 2022-06-18 ENCOUNTER — Other Ambulatory Visit: Payer: Self-pay | Admitting: *Deleted

## 2022-06-18 NOTE — Progress Notes (Deleted)
Letter for patient sent to her by email

## 2022-06-19 ENCOUNTER — Telehealth: Payer: Self-pay | Admitting: *Deleted

## 2022-06-19 NOTE — Telephone Encounter (Signed)
Pt came over earlier in the week to ask for the [employer] form and letter for work for the month of sept. I faxed the [employer] form today and the fax transmission wen through. Then I sent the letter as she wanted was emailed to her email account.

## 2022-06-22 ENCOUNTER — Inpatient Hospital Stay: Payer: Commercial Managed Care - HMO

## 2022-06-22 ENCOUNTER — Ambulatory Visit
Admission: RE | Admit: 2022-06-22 | Discharge: 2022-06-22 | Disposition: A | Discharge status: Home / Self Care | Payer: Commercial Managed Care - HMO | Source: Ambulatory Visit | Attending: Radiation Oncology | Admitting: Radiation Oncology

## 2022-06-22 VITALS — BP 142/70 | HR 86 | Temp 95.9°F | Resp 16 | Wt 206.3 lb

## 2022-06-22 DIAGNOSIS — C50412 Malignant neoplasm of upper-outer quadrant of left female breast: Secondary | ICD-10-CM | POA: Insufficient documentation

## 2022-06-22 DIAGNOSIS — Z923 Personal history of irradiation: Secondary | ICD-10-CM | POA: Diagnosis not present

## 2022-06-22 DIAGNOSIS — Z171 Estrogen receptor negative status [ER-]: Secondary | ICD-10-CM | POA: Insufficient documentation

## 2022-06-22 DIAGNOSIS — R002 Palpitations: Secondary | ICD-10-CM | POA: Insufficient documentation

## 2022-06-22 NOTE — Progress Notes (Signed)
Radiation Oncology Follow up Note  Name: Vickie Mathis   Date:   06/22/2022 MRN:  [id number] DOB: Nov 19, 1990    This 31 y.o. female presents to the clinic today for 26-monthfollow-up status post radiation therapy to her left chest wall for stage IIb (T2 N1 M0) grade 3 triple negative invasive mammary carcinoma status post neoadjuvant chemotherapy followed by bilateral mastectomies..Marland Kitchen REFERRING PROVIDER: BJuluis Pitch MD  HPI: Patient is a 31year old female now out 6 months having completed left chest wall radiation therapy in a patient with a complete response status post neoadjuvant chemotherapy followed by bilateral mastectomies.  Seen today in routine follow-up she is doing well.  She has been having some heart palpitations that is being worked up by cardiology.  She is also currently in the process of the undergoing breast reconstruction.  She specifically denies bone pain cough or any chest wall masses or nodularity..  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  COMPLICATIONS OF TREATMENT: none  FOLLOW UP COMPLIANCE: keeps appointments   PHYSICAL EXAM:  BP (!) 142/70 (Patient Position: Sitting)   Pulse 86   Temp (!) 95.9 F (35.5 C) (Tympanic)   Resp 16   Wt 206 lb 4.8 oz (93.6 kg)   SpO2 97%   BMI 30.47 kg/m  Patient status post bilateral mastectomies.  No evidence of chest wall mass or nodularity is noted.  No axillary or supraclavicular adenopathy is identified.  Well-developed well-nourished patient in NAD. HEENT reveals PERLA, EOMI, discs not visualized.  Oral cavity is clear. No oral mucosal lesions are identified. Neck is clear without evidence of cervical or supraclavicular adenopathy. Lungs are clear to A&P. Cardiac examination is essentially unremarkable with regular rate and rhythm without murmur rub or  thrill. Abdomen is benign with no organomegaly or masses noted. Motor sensory and DTR levels are equal and symmetric in the upper and lower extremities. Cranial nerves II through XII are grossly intact. Proprioception is intact. No peripheral adenopathy or edema is identified. No motor or sensory levels are noted. Crude visual fields are within normal range.  RADIOLOGY RESULTS: No current films for review  PLAN: Time patient is doing well with no evidence of disease 6 months out from radiation therapy to her left chest wall.  And pleased with her overall progress she has had minimal side effect profile.  She is currently undergoing procedures for breast reconstruction.  I have asked to see her out in 6 months for follow-up.  Patient is to call with any concerns at any time.  I would like to take this opportunity to thank you for allowing me to participate in the care of your patient..Noreene Filbert MD

## 2022-06-23 ENCOUNTER — Other Ambulatory Visit: Payer: Self-pay | Admitting: Oncology

## 2022-06-23 ENCOUNTER — Telehealth: Payer: Self-pay | Admitting: *Deleted

## 2022-06-23 ENCOUNTER — Other Ambulatory Visit: Payer: Self-pay

## 2022-06-23 NOTE — Telephone Encounter (Signed)
Ask her what she wants to do about future refills?

## 2022-06-23 NOTE — Telephone Encounter (Signed)
Dr. Janese Banks says that there was a request to refill clonazepam and Dr. Janese Banks says that she should be back to PCP and get set up with her since we only took it over when you were getting chemotherapy and radiation. Since all that is over and we are still checking labs for the liver issue that she prefers to have the PCP take back over with the medicine. When I spoke to pt. She says she has an appt in 1 1/2 weeks so she feels that PCP will take over.

## 2022-06-24 ENCOUNTER — Other Ambulatory Visit: Payer: Self-pay | Admitting: *Deleted

## 2022-06-24 ENCOUNTER — Encounter: Payer: Self-pay | Admitting: Oncology

## 2022-06-24 ENCOUNTER — Encounter: Payer: Self-pay | Admitting: Internal Medicine

## 2022-06-24 MED ORDER — CLONAZEPAM 0.5 MG PO TABS: 0.5000 mg | ORAL_TABLET | Freq: Three times a day (TID) | ORAL | 0 refills | Status: DC | PRN

## 2022-06-24 MED ORDER — CLONAZEPAM 0.5 MG PO TABS: 0.5000 mg | ORAL_TABLET | Freq: Three times a day (TID) | ORAL | 0 refills | Status: AC | PRN

## 2022-06-25 ENCOUNTER — Encounter: Payer: Self-pay | Admitting: Internal Medicine

## 2022-06-25 ENCOUNTER — Encounter: Payer: Self-pay | Admitting: Oncology

## 2022-06-25 NOTE — Telephone Encounter (Signed)
Prescription was sent yesterday.

## 2022-06-26 ENCOUNTER — Telehealth: Payer: Self-pay | Admitting: Plastic Surgery

## 2022-06-26 DIAGNOSIS — Z719 Counseling, unspecified: Secondary | ICD-10-CM

## 2022-06-26 NOTE — Telephone Encounter (Signed)
Called pt to inform her that we did receive fax for diability form and cost to have completed.  She will call back to handle payment.

## 2022-06-27 ENCOUNTER — Other Ambulatory Visit: Payer: Self-pay

## 2022-07-02 ENCOUNTER — Other Ambulatory Visit: Payer: Self-pay | Admitting: Oncology

## 2022-07-03 ENCOUNTER — Encounter: Payer: Self-pay | Admitting: Internal Medicine

## 2022-07-03 ENCOUNTER — Encounter: Payer: Self-pay | Admitting: Oncology

## 2022-07-04 ENCOUNTER — Other Ambulatory Visit: Payer: Self-pay | Admitting: Oncology

## 2022-07-06 ENCOUNTER — Encounter: Payer: Self-pay | Admitting: Internal Medicine

## 2022-07-06 ENCOUNTER — Encounter: Payer: Self-pay | Admitting: Oncology

## 2022-07-06 NOTE — Progress Notes (Deleted)
Letter for patient

## 2022-07-15 ENCOUNTER — Inpatient Hospital Stay: Payer: Commercial Managed Care - HMO | Attending: Oncology

## 2022-07-15 ENCOUNTER — Inpatient Hospital Stay: Payer: Commercial Managed Care - HMO

## 2022-07-15 DIAGNOSIS — Z95828 Presence of other vascular implants and grafts: Secondary | ICD-10-CM

## 2022-07-15 DIAGNOSIS — C50412 Malignant neoplasm of upper-outer quadrant of left female breast: Secondary | ICD-10-CM

## 2022-07-15 DIAGNOSIS — K754 Autoimmune hepatitis: Secondary | ICD-10-CM | POA: Insufficient documentation

## 2022-07-15 DIAGNOSIS — R7401 Elevation of levels of liver transaminase levels: Secondary | ICD-10-CM

## 2022-07-15 LAB — CBC WITH DIFFERENTIAL/PLATELET
Abs Immature Granulocytes: 0.03 10*3/uL (ref 0.00–0.07)
Basophils Absolute: 0 10*3/uL (ref 0.0–0.1)
Basophils Relative: 0 %
Eosinophils Absolute: 0.1 10*3/uL (ref 0.0–0.5)
Eosinophils Relative: 1 %
HCT: 37.5 % (ref 36.0–46.0)
Hemoglobin: 12.8 g/dL (ref 12.0–15.0)
Immature Granulocytes: 0 %
Lymphocytes Relative: 10 %
Lymphs Abs: 0.9 10*3/uL (ref 0.7–4.0)
MCH: 29.4 pg (ref 26.0–34.0)
MCHC: 34.1 g/dL (ref 30.0–36.0)
MCV: 86.2 fL (ref 80.0–100.0)
Monocytes Absolute: 0.7 10*3/uL (ref 0.1–1.0)
Monocytes Relative: 8 %
Neutro Abs: 7 10*3/uL (ref 1.7–7.7)
Neutrophils Relative %: 81 %
Platelets: 166 10*3/uL (ref 150–400)
RBC: 4.35 MIL/uL (ref 3.87–5.11)
RDW: 14.4 % (ref 11.5–15.5)
WBC: 8.8 10*3/uL (ref 4.0–10.5)
nRBC: 0 % (ref 0.0–0.2)

## 2022-07-15 LAB — COMPREHENSIVE METABOLIC PANEL
ALT: 49 U/L — ABNORMAL HIGH (ref 0–44)
AST: 58 U/L — ABNORMAL HIGH (ref 15–41)
Albumin: 4.2 g/dL (ref 3.5–5.0)
Alkaline Phosphatase: 88 U/L (ref 38–126)
Anion gap: 8 (ref 5–15)
BUN: 9 mg/dL (ref 6–20)
CO2: 24 mmol/L (ref 22–32)
Calcium: 9.2 mg/dL (ref 8.9–10.3)
Chloride: 106 mmol/L (ref 98–111)
Creatinine, Ser: 0.6 mg/dL (ref 0.44–1.00)
GFR, Estimated: >60 mL/min (ref >60)
Glucose, Bld: 84 mg/dL (ref 70–99)
Potassium: 3.6 mmol/L (ref 3.5–5.1)
Sodium: 138 mmol/L (ref 135–145)
Total Bilirubin: 0.5 mg/dL (ref 0.3–1.2)
Total Protein: 6.9 g/dL (ref 6.5–8.1)

## 2022-07-15 MED ORDER — HEPARIN SOD (PORK) LOCK FLUSH 100 UNIT/ML IV SOLN
500.0000 [IU] | Freq: Once | INTRAVENOUS | Status: AC
  Administered 2022-07-15: 500 [IU] via INTRAVENOUS
  Filled 2022-07-15: qty 5

## 2022-07-15 MED ORDER — SODIUM CHLORIDE 0.9% FLUSH
10.0000 mL | Freq: Once | INTRAVENOUS | Status: AC
  Administered 2022-07-15: 10 mL via INTRAVENOUS
  Filled 2022-07-15: qty 10

## 2022-07-20 ENCOUNTER — Other Ambulatory Visit: Payer: Self-pay | Admitting: *Deleted

## 2022-07-20 NOTE — Progress Notes (Unsigned)
Letter written

## 2022-07-21 ENCOUNTER — Other Ambulatory Visit: Payer: Self-pay | Admitting: *Deleted

## 2022-07-21 DIAGNOSIS — F4323 Adjustment disorder with mixed anxiety and depressed mood: Secondary | ICD-10-CM

## 2022-07-21 DIAGNOSIS — I972 Postmastectomy lymphedema syndrome: Secondary | ICD-10-CM

## 2022-07-21 DIAGNOSIS — M4802 Spinal stenosis, cervical region: Secondary | ICD-10-CM

## 2022-07-21 DIAGNOSIS — G620 Drug-induced polyneuropathy: Secondary | ICD-10-CM

## 2022-07-21 DIAGNOSIS — Z171 Estrogen receptor negative status [ER-]: Secondary | ICD-10-CM

## 2022-07-23 ENCOUNTER — Encounter: Payer: Self-pay | Admitting: Oncology

## 2022-07-23 ENCOUNTER — Encounter: Payer: Self-pay | Admitting: Internal Medicine

## 2022-08-05 ENCOUNTER — Other Ambulatory Visit: Payer: Commercial Managed Care - HMO

## 2022-08-05 ENCOUNTER — Ambulatory Visit: Payer: Commercial Managed Care - HMO | Admitting: Oncology

## 2022-08-24 ENCOUNTER — Inpatient Hospital Stay (HOSPITAL_BASED_OUTPATIENT_CLINIC_OR_DEPARTMENT_OTHER): Payer: Commercial Managed Care - HMO | Admitting: Oncology

## 2022-08-24 ENCOUNTER — Encounter: Payer: Self-pay | Admitting: Occupational Therapy

## 2022-08-24 ENCOUNTER — Ambulatory Visit: Payer: Commercial Managed Care - HMO | Attending: Oncology | Admitting: Occupational Therapy

## 2022-08-24 ENCOUNTER — Inpatient Hospital Stay: Payer: Commercial Managed Care - HMO

## 2022-08-24 ENCOUNTER — Encounter: Payer: Self-pay | Admitting: Oncology

## 2022-08-24 ENCOUNTER — Inpatient Hospital Stay: Payer: Commercial Managed Care - HMO | Attending: Oncology

## 2022-08-24 VITALS — BP 107/64 | HR 78 | Temp 97.0°F | Resp 18 | Wt 202.5 lb

## 2022-08-24 DIAGNOSIS — Z809 Family history of malignant neoplasm, unspecified: Secondary | ICD-10-CM | POA: Insufficient documentation

## 2022-08-24 DIAGNOSIS — C50412 Malignant neoplasm of upper-outer quadrant of left female breast: Secondary | ICD-10-CM | POA: Diagnosis not present

## 2022-08-24 DIAGNOSIS — T451X5A Adverse effect of antineoplastic and immunosuppressive drugs, initial encounter: Secondary | ICD-10-CM | POA: Insufficient documentation

## 2022-08-24 DIAGNOSIS — K754 Autoimmune hepatitis: Secondary | ICD-10-CM | POA: Diagnosis not present

## 2022-08-24 DIAGNOSIS — R7401 Elevation of levels of liver transaminase levels: Secondary | ICD-10-CM

## 2022-08-24 DIAGNOSIS — G620 Drug-induced polyneuropathy: Secondary | ICD-10-CM | POA: Diagnosis not present

## 2022-08-24 DIAGNOSIS — Z853 Personal history of malignant neoplasm of breast: Secondary | ICD-10-CM | POA: Insufficient documentation

## 2022-08-24 DIAGNOSIS — Z79899 Other long term (current) drug therapy: Secondary | ICD-10-CM | POA: Insufficient documentation

## 2022-08-24 DIAGNOSIS — I972 Postmastectomy lymphedema syndrome: Secondary | ICD-10-CM | POA: Diagnosis not present

## 2022-08-24 DIAGNOSIS — Z171 Estrogen receptor negative status [ER-]: Secondary | ICD-10-CM | POA: Insufficient documentation

## 2022-08-24 DIAGNOSIS — F1290 Cannabis use, unspecified, uncomplicated: Secondary | ICD-10-CM | POA: Diagnosis not present

## 2022-08-24 DIAGNOSIS — Z08 Encounter for follow-up examination after completed treatment for malignant neoplasm: Secondary | ICD-10-CM | POA: Diagnosis not present

## 2022-08-24 DIAGNOSIS — M4802 Spinal stenosis, cervical region: Secondary | ICD-10-CM | POA: Diagnosis not present

## 2022-08-24 DIAGNOSIS — Z95828 Presence of other vascular implants and grafts: Secondary | ICD-10-CM

## 2022-08-24 LAB — CBC WITH DIFFERENTIAL/PLATELET
Abs Immature Granulocytes: 0.02 10*3/uL (ref 0.00–0.07)
Basophils Absolute: 0 10*3/uL (ref 0.0–0.1)
Basophils Relative: 1 %
Eosinophils Absolute: 0.1 10*3/uL (ref 0.0–0.5)
Eosinophils Relative: 2 %
HCT: 38.6 % (ref 36.0–46.0)
Hemoglobin: 12.7 g/dL (ref 12.0–15.0)
Immature Granulocytes: 0 %
Lymphocytes Relative: 15 %
Lymphs Abs: 0.8 10*3/uL (ref 0.7–4.0)
MCH: 28.7 pg (ref 26.0–34.0)
MCHC: 32.9 g/dL (ref 30.0–36.0)
MCV: 87.1 fL (ref 80.0–100.0)
Monocytes Absolute: 0.5 10*3/uL (ref 0.1–1.0)
Monocytes Relative: 10 %
Neutro Abs: 3.8 10*3/uL (ref 1.7–7.7)
Neutrophils Relative %: 72 %
Platelets: 181 10*3/uL (ref 150–400)
RBC: 4.43 MIL/uL (ref 3.87–5.11)
RDW: 17 % — ABNORMAL HIGH (ref 11.5–15.5)
WBC: 5.3 10*3/uL (ref 4.0–10.5)
nRBC: 0 % (ref 0.0–0.2)

## 2022-08-24 LAB — COMPREHENSIVE METABOLIC PANEL
ALT: 26 U/L (ref 0–44)
AST: 34 U/L (ref 15–41)
Albumin: 4.2 g/dL (ref 3.5–5.0)
Alkaline Phosphatase: 91 U/L (ref 38–126)
Anion gap: 6 (ref 5–15)
BUN: 13 mg/dL (ref 6–20)
CO2: 25 mmol/L (ref 22–32)
Calcium: 8.8 mg/dL — ABNORMAL LOW (ref 8.9–10.3)
Chloride: 103 mmol/L (ref 98–111)
Creatinine, Ser: 0.68 mg/dL (ref 0.44–1.00)
GFR, Estimated: >60 mL/min (ref >60)
Glucose, Bld: 146 mg/dL — ABNORMAL HIGH (ref 70–99)
Potassium: 4 mmol/L (ref 3.5–5.1)
Sodium: 134 mmol/L — ABNORMAL LOW (ref 135–145)
Total Bilirubin: 0.4 mg/dL (ref 0.3–1.2)
Total Protein: 7.1 g/dL (ref 6.5–8.1)

## 2022-08-24 MED ORDER — SODIUM CHLORIDE 0.9% FLUSH
10.0000 mL | Freq: Once | INTRAVENOUS | Status: AC
  Administered 2022-08-24: 10 mL via INTRAVENOUS
  Filled 2022-08-24: qty 10

## 2022-08-24 MED ORDER — HEPARIN SOD (PORK) LOCK FLUSH 100 UNIT/ML IV SOLN
500.0000 [IU] | Freq: Once | INTRAVENOUS | Status: AC
  Administered 2022-08-24: 500 [IU] via INTRAVENOUS
  Filled 2022-08-24: qty 5

## 2022-08-24 NOTE — Therapy (Signed)
OUTPATIENT OCCUPATIONAL THERAPY  EVALUATION UPPER EXTREMITY LYMPHEDEMA  Patient Name: Vickie Mathis MRN: [id number] DOB:02-02-1991, 31 y.o., female Today's Date: 08/25/2022  END OF SESSION:  OT End of Session - 08/24/22 1517     Visit Number 1    Number of Visits 8    Date for OT Re-Evaluation 11/22/22    OT Start Time 0820    OT Stop Time 0905    OT Time Calculation (min) 45 min    Activity Tolerance Patient tolerated treatment well;No increased pain    Behavior During Therapy WFL for tasks assessed/performed               Past Medical History:  Diagnosis Date   Anxiety    Asthma    Breast cancer ([place]) 11/2020   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 AXILLARY NODE DISSECTION;  Surgeon: Herbert Pun, MD;  Location: [facility];  Service: General;  Laterality: Bilateral;  [place], 1.5 hours Cintron-Diaz 2.5 hours   BREAST BIOPSY Left 11/26/2020   vision 12:00 6cmfn [facility]   BREAST BIOPSY Left 11/26/2020   LN bx, hydro marker,  fragments of macrometastatic carcinoma   BREAST RECONSTRUCTION WITH PLACEMENT OF TISSUE EXPANDER AND FLEX HD (ACELLULAR HYDRATED DERMIS) Bilateral 06/25/2021   Procedure: BREAST RECONSTRUCTION WITH PLACEMENT OF TISSUE EXPANDER AND FLEX HD (ACELLULAR HYDRATED DERMIS);  Surgeon: Wallace Going, DO;  Location: [facility];  Service: Plastics;  Laterality: Bilateral;   PORTACATH PLACEMENT Right 12/13/2020   Procedure: INSERTION PORT-A-CATH;  Surgeon: Herbert Pun, MD;  Location: [facility];  Service: General;  Laterality: Right;   REMOVAL OF BILATERAL TISSUE EXPANDERS WITH  PLACEMENT OF BILATERAL BREAST IMPLANTS Bilateral 09/15/2021   Procedure: REMOVAL OF BILATERAL TISSUE EXPANDERS;  Surgeon: Wallace Going, DO;  Location: [place];  Service: Plastics;  Laterality: Bilateral;   Patient Active Problem List   Diagnosis Date Noted   Chemotherapy-induced neuropathy ([place]) 02/06/2022   Right sided weakness 02/06/2022   Transaminitis 11/20/2021   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 10/25/2021   Malignant neoplasm of left breast in female, estrogen receptor positive ([place]) 06/25/2021   Genetic testing 01/15/2021   Family history of cancer    Invasive carcinoma of breast ([place]) 12/03/2020   Breast mass, left 07/01/2020   Situational mixed anxiety and depressive disorder 07/01/2020   Anxiety state 08/06/2014   Varicose veins of lower extremity 08/06/2014    PCP: Juluis Pitch, MD   REFERRING PROVIDER: Sindy Guadeloupe, MD  REFERRING DIAG: 197.2  THERAPY DIAG: Postmastectomy lymphedema syndrome  ONSET DATE: 9/22  Rationale for Evaluation and Treatment: Rehabilitation  SUBJECTIVE  SUBJECTIVE STATEMENT: Vickie Mathis   PERTINENT HISTORY: 2021 self palpated L breast mass. Diagnostic mammogram and ultrasound revealed 2.7 cm mass at 12 o'clock 6 cm from the nipple. L axillary ultrasound revealed 2 suspicious LN. Both breast mass and LN + for invasive mammary carcinoma grade 3, ER/PR and her 2 negative.R breast non-mass enhancement was negative for Br Ca.Pt underwent neoadjuvant chemotherapy. 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 She underwent bilateral mastectomy with reconstruction in 9/22.Pathology revealed complete pathological response with 2 LN negative and R breast negative for malignancy. Breast  implants removed 2/2 staff infection. Anxiety, chemo-induced neuropathy, intermittent L chest wall pain,   IMPAIRMENTS: chronic pain L chest wall and lateral trunk s/p bilateral mastectomy and ALND, fatigue, altered sensation 2/2 chemo-induced neuropathy   FUNCTIONAL LIMITATIONS: pain at end ranges BUE shoulders, R>L; decreased hand function, limited participation in productive activities and work while recovering from cancer treatment.  OTHER OBSTACLES TO OT LE CARE: none identified at initial eval  PAIN:  Are you having pain? Yes NPRS scale: 1-2/10 Pain location: L lateral trunk and chest at mastectomy site PAIN TYPE: chronic Pain description: intermittent  Aggravating factors: lifting, carrying, pushing, pulling, reaching overhead Relieving factors: unknown   PRECAUTIONS: Other: lymphedema precaution  FALLS:  Has patient fallen in last 6 months?  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  LEISURE: family  HAND DOMINANCE : right   PRIOR LEVEL OF FUNCTION: Independent  PATIENT GOALS: relieve post-mastectomy pain and limit LE progression   OBJECTIVE  COGNITION:  Overall cognitive status: Within functional limits for tasks assessed   OBSERVATIONS / OTHER ASSESSMENTS: Subclinical/ Stage I, LUE/LUQ, post-mastectomy Lymphedema   Skin  Description Hyper-Keratosis Peau' de Orange Shiny Tight Fibrotic/ Indurated Fatty Doughy Spongy/ boggy       Thickened scar at L surgical site   X L lateral trunk   Skin dry Flaky WNL Macerated     x    Color WNL Redness Blanching Hemosiderin Stain Mottled   x        Odor Malodorous Yeast Fungal infection  WNL      x   Temperature Warm Cool WNL     x    Pitting Edema   1+ 2+ 3+ 4+ Non-pitting         x   Girth Symmetrical Asymmetrical                   Distribution   BUE appear  to be symmetrical by visual assessment  L  lateral trunk puffy" and mildly swollen    Stemmer Sign Positive Negative    x   Lymphorrhea History Of:  Present Absent     x    Wounds History Of Present Absent Venous Arterial Pressure Sheer     x        Signs of Infection Redness Warmth Erythema Acute Swelling Drainage Borders                    Sensation Light Touch Deep pressure Hypersensitivity   Present Impaired Present Impaired Absent Impaired    x x  x     Nails WNL   Fungus nail dystrophy   x     Hair Growth Symmetrical Asymmetrical   x    Skin Creases Base of toes  Ankles   Base of Fingers  knees       Abdominal pannus Thigh Lobules  Face/neck              SENSATION: Light touch in tact; decreased sensation hands and feet, lateral L trunk and bilateral surgical sites (L lateral trunk adjacent to axilla, dorsal L arm, bilateral chest wall at  surgical sites.   POSTURE: WNL; no guarding. Symmetrical arm swing  UPPER EXTREMITY AROM/PROM: TBA  SURGERY TYPE/DATE:  B mastectomy; R prophylactic. L ALND 9/ 2022  NUMBER OF LYMPH NODES REMOVED: 2?  CHEMOTHERAPY: Neoadjuvant chemo, adjuvant Keytruda ended early  RADIATION:yes, L  HORMONE TREATMENT: unknown  PREVIOUS LYMPHEDEMA RX: initial screening with [facility] and follow up visit. No arm sleeve.   COMPARATIVE LIMB VOLUMETRICS  BLE COMPARATIVE LIMB VOLUMETRICS: TBA OT Rx visit 1  LANDMARK RIGHT    R MPs   RUE ml     Limb Volume differential (LVD)  %  Volume change since initial %  Volume change overall V  (Blank rows = not tested)  LANDMARK LEFT  05/27/22  L MPs   RUE (excluding hand)  ml     Limb Volume differential (LVD)  %  Volume change since initial %  Volume change overall %  (Blank rows = not tested) :    PATIENT SURVEYS:  FOTO functional outcome measure: Intake TBA OT Rx visit 1   LYMPHEDEMA LIFE IMPACT SCALE: (The extent to which lymphedema-related problems affected your life last week)  Intake score: 20.59%     TODAY'S TREATMENT:                                                                                                                                        Initial Occupational Therapy evaluation and created POC with emphasis on fitting prophylactic arm sleeve and edu for LE self care to limit progression. Pt edu  PATIENT EDUCATION:  Education details: Provided Pt/ family education regarding lymphatic structure and function, lymphedema etiology, onset patterns and stages of progression. Taught interaction between blood circulatory system and lymphatic circulation.Discussed  impact of gravity and co-morbidities on lymphatic function. Outlined Complete Decongestive Therapy (CDT)  as standard of care and provided in depth information regarding 4 primary components of Intensive and Self Management Phases, including Manual Lymph Drainage (MLD), compression wrapping and garments, skin care, and therapeutic exercise. Pilar Plate discussion with re need for frequent attendance and high burden of care when caregiver is needed, impact of co morbidities. We discussed  the chronic, progressive nature of lymphedema and Importance of daily, ongoing LE self-care essential for limiting progression and infection risk. Discussed the demanding nature of CDT and importance of high compliance with all home program components for optimal outcome. Person educated: Patient Education method: Handouts Education comprehension: verbalized understanding, returned demonstration, and needs further education  HOME EXERCISE PROGRAM: 1.Therapeutic lymphatic pumping therex- 2 sets of 10 reps, hold 5 seconds; elements  in order. 2 x daily PRN 2.  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 3. Daily skin care to sustain optimal hydration. Wash bites, scratches, blisters, etc with mild soap and water , apply  antibacterial first aide cream and a band aide. 4. Simple self-Manual lymphatic drainage (MLD) PRN.  ASSESSMENT:  CLINICAL IMPRESSION: Dmya Long is a 86 y o female presenting with stage I/ subclinical, LUE/LUQ post-mastectomy lymphedema and associated pain. Pt presents with slight increase in L lateral trunk volume adjacent to the axilla and increased tissue density and scar thickening at the L surgical site of chest well. Pt experiencing mild pain at end rages of [facility] bilateral shoulder AROM in all planes. 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 Mrs. Bryner will benefit from skilled Occupational Therapy to limit lymphedema progression. While undergoing an abbreviated and modified course of Intensive Phase CDT she will be fitted with a prophylactic compression arm sleeve to be worn in keeping with lymphedema precautions. She will be fitted with compressive bra and convoluted foam insert     to lateral trunk and mastectomy sites bilaterally to soften scar and limit worsening of tissue and radiation  fibrosis, she will learn to perform all lymphedema self-care home program components, including simple self MLD, lymphatic pumping there ex, skin care and compression garment wear and care routines.She will receive clinical MLD, scar massage, myofacial release and possibly Kinesio taping to reduce risk of LE progression, to reduce pain and promote skin excursion and flexibility. Without skilled OT for lymphedema care, lymphedema will progress and further functional decline is expected.   OBJECTIVE  IMPAIRMENTS: decreased knowledge of lymphedema ;  decreased knowledge of use of DME, decreased ROM, increased edema, impaired sensation, and pain with onset in   ACTIVITY LIMITATIONS: carrying, lifting, bed mobility, bathing, dressing, reach over head, and hygiene/grooming  PARTICIPATION LIMITATIONS: occupation  PERSONAL FACTORS:  Hx comorbid anxiety  is also affecting patient's functional outcome.   REHAB POTENTIAL: Good  CLINICAL DECISION MAKING: Stable/uncomplicated  EVALUATION COMPLEXITY: Low  GOALS: Goals reviewed with patient? Yes  LONG TERM GOALS: Target date: 11/22/21   Given this patient's Intake score of 20.59/100% on the Lymphedema Life Impact Scale (LLIS), patient will experience a reduction of at least 5% in her perceived level of functional impairment resulting from lymphedema to improve functional performance and quality of life (QOL). Baseline: 20.59%  Goal status: INITIAL  2.  Given this patient's Intake score of TBA/100% on the functional outcomes FOTO tool, patient will experience an increase in function of 5 points  to improve basic and instrumental ADLs performance, including lymphedema self-care. Baseline:  TBA Goal status: INITIAL  3.   Pt will demonstrate understanding of lymphedema precautions and prevention strategies with modified independence using a printed reference to identify at least 5 precautions and discussing how s/he may implement them into daily life to reduce risk of progression and to limit infection risk. Baseline: Max A Goal status: INITIAL  4.  Pt will be able to don and doff prophylactic compression arm sleeve using assistive devices and extra time (modified  independent), and discuss wear and garment care recommendations  to limit lymphedema by issue date. Baseline: dependent Goal status: INITIAL  5.  Pt will demonstrate the ability to perform all lymphedema self-care home program components with printed reference sheets (modified  independence) by DC to limit lymphedema progression Baseline: dependent Goal status: INITIAL  6.  Pt will report 0/10 shoulder pain with AROM in all planes at end ranges bilaterally against gravity by DC for optimal functional performance of basic and instrumental ADLs, work and productive activities, leisure pursuits and social participation, Baseline: 2/10 Goal status: INITIAL  PLAN:  PT FREQUENCY: 1-2x/week  PT DURATION: 8 weeks  PLANNED INTERVENTIONS: Therapeutic exercises, Therapeutic activity, Neuromuscular re-education, Balance training, Gait training, Patient/Family education, Self Care, and Joint mobilization  PLAN FOR NEXT SESSION:  complete baseline BUE limb volumetrics and chest /trunk measurements Teach lymphatic pumping therex and stretching   Andrey Spearman, MS, OTR/L, CLT-LANA 08/25/22 3:43 PM

## 2022-08-24 NOTE — Progress Notes (Signed)
Pt will like to discuss speaking to a psychiatrist.

## 2022-08-24 NOTE — Progress Notes (Signed)
Hematology/Oncology Consult note [facility]  Telephone:([phone] [phone]  Patient Care Team: Juluis Pitch, MD as PCP - General (Family Medicine) Sindy Guadeloupe, MD as Consulting Physician (Hematology and Oncology) Dillingham, Loel Lofty, DO as Consulting Physician (Plastic Surgery) Noreene Filbert, MD as Consulting Physician (Radiation Oncology) Herbert Pun, MD as Consulting Physician (General Surgery)   Name of the patient: Vickie Mathis  [id number]  10-09-1990   Date of visit: 08/24/22  Diagnosis-  locally advanced 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 breast cancer at least T2 N1 M0 s/p neoadjuvant chemotherapy surgery and pathological complete response    Chief complaint/ Reason for visit-routine follow-up of breast cancer  Heme/Onc history: patient is a 31 year old female who self palpated a left breast mass and sought medical attention recently after thinking it was a possible cyst for all this while.She underwent a diagnostic bilateral mammogram and ultrasound which showed a 2.7 cm mass at the 12 o'clock position 6 cm from the nipple.  Ultrasound of the left axilla demonstrates 2 lymph nodes with mild thickened cortices of 4 mm.  There is skin thickening on the lower inner quadrant of the left breast on mammography.  Both the breast mass and the lymph node were biopsied and was positive for invasive mammary carcinoma grade 3.  Lymph node was also suspicious for extracapsular extension.    ER/PR and HER-2 negative   MRI showed irregular enhancing mass at the 12 o'clock position of the left breast measuring 2.5 x 2.3 cm and a linear component extending 2.3 cm anteriorly.  The mass in the anterior linear extension combined measure 4.3 cm.  3.9 cm in the cephalocaudal dimension.  Also an area of clumped non-mass enhancement in the posterior aspect of the lower quadrant of the left breast measuring 2.6 x 0.9 cm which looks suspicious.  2 enlarged left  axillary lymph nodes and 2 mildly enlarged internal mammary lymph nodes.  4.3 cm clumped area of non-mass enhancement in the outer quadrant of the right breast.  3 right axillary lymph nodes with mild cortical thickening.   Patient underwent biopsy of the right breast non-mass enhancement and that was negative for malignancy   CT scan showed mildly enlarged right inguinal lymph nodes nonspecific.  Left upper breast mass with prominent left axillary lymph nodes.  Subcentimeter pulmonary nodules which are calcified and compatible with benign old granulomatous disease.  0.6 5.4 cm lucent lesion in the left first rib possibly a hemangioma or benign lesion.   Patient developed significant infusion reaction to carboplatin with dose 9 when her blood pressure dropped and she became tachycardic and significantly nauseous.  She went on to complete [facility] Keytruda chemotherapy as per keynote 522 regimen.  Patient underwent bilateral mastectomy with reconstruction in September 2022.  Final pathology showed complete pathological response2 sentinel lymph nodes negative for malignancy.  No malignancy noted in the right breast.   Plan was to complete 9 cycles of adjuvant Keytruda.  Patient noted to have abnormal LFTs on Keytruda.  Last dose of Keytruda given on 10/08/2021.  Subsequently her LFTs were significantly elevated and patient did not receive any further Keytruda.  She was admitted to [facility] for grade 4 hepatitis likely secondary to [facility].  She was on a prolonged course of steroids and is presently on mycophenolate as directed by [facility].  LFTs have normalized  Interval history-patient reports ongoing fatigue.  She reports neck pain which has been ongoing for a few months.  Symptoms of peripheral neuropathy  are stable to improving.  She is on mycophenolate 1250 mg twice daily for her autoimmune hepatitis.  ECOG PS- 1 Pain scale- 0   Review of systems- Review of Systems  Constitutional:  Positive for  malaise/fatigue. Negative for chills, fever and weight loss.  HENT:  Negative for congestion, ear discharge and nosebleeds.   Eyes:  Negative for blurred vision.  Respiratory:  Negative for cough, hemoptysis, sputum production, shortness of breath and wheezing.   Cardiovascular:  Negative for chest pain, palpitations, orthopnea and claudication.  Gastrointestinal:  Negative for abdominal pain, blood in stool, constipation, diarrhea, heartburn, melena, nausea and vomiting.  Genitourinary:  Negative for dysuria, flank pain, frequency, hematuria and urgency.  Musculoskeletal:  Negative for back pain, joint pain and myalgias.  Skin:  Negative for rash.  Neurological:  Positive for sensory change (Peripheral neuropathy). Negative for dizziness, tingling, focal weakness, seizures, weakness and headaches.  Endo/Heme/Allergies:  Does not bruise/bleed easily.  Psychiatric/Behavioral:  Negative for depression and suicidal ideas. The patient does not have insomnia.       Allergies  Allergen Reactions   Carboplatin Shortness Of Breath, Nausea And Vomiting and Other (See Comments)    Flushing- chest , face, neck , arm including hand   Amoxicillin     Other reaction(s): "too young to remember what they do to me"   Sulfa Antibiotics     Other reaction(s): "too young to remember what they do to me"     Past Medical History:  Diagnosis Date   Anxiety    Asthma    Breast cancer ([place]) 11/2020   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 breast ca   Depression    Family history of cancer    History of chemotherapy    Varicose veins of bilateral lower extremities with pain      Past Surgical History:  Procedure Laterality Date   APPENDECTOMY  2018   BILATERAL TOTAL MASTECTOMY WITH AXILLARY LYMPH NODE DISSECTION Bilateral 06/25/2021   Procedure: BILATERAL TOTAL MASTECTOMY WITH LEFT AXILLARY LYMPH NODE BIOPSY VS. AXILLARY NODE DISSECTION;  Surgeon: Herbert Pun, MD;  Location: [facility] ORS;  Service: General;   Laterality: Bilateral;  Dillingham, 1.5 hours Cintron-Diaz 2.5 hours   BREAST BIOPSY Left 11/26/2020   vision 12:00 6cmfn [facility]   BREAST BIOPSY Left 11/26/2020   LN bx, hydro marker,  fragments of macrometastatic carcinoma   BREAST RECONSTRUCTION WITH PLACEMENT OF TISSUE EXPANDER AND FLEX HD (ACELLULAR HYDRATED DERMIS) Bilateral 06/25/2021   Procedure: BREAST RECONSTRUCTION WITH PLACEMENT OF TISSUE EXPANDER AND FLEX HD (ACELLULAR HYDRATED DERMIS);  Surgeon: Wallace Going, DO;  Location: [facility] ORS;  Service: Plastics;  Laterality: Bilateral;   PORTACATH PLACEMENT Right 12/13/2020   Procedure: INSERTION PORT-A-CATH;  Surgeon: Herbert Pun, MD;  Location: [facility] ORS;  Service: General;  Laterality: Right;   REMOVAL OF BILATERAL TISSUE EXPANDERS WITH PLACEMENT OF BILATERAL BREAST IMPLANTS Bilateral 09/15/2021   Procedure: REMOVAL OF BILATERAL TISSUE EXPANDERS;  Surgeon: Wallace Going, DO;  Location: [place];  Service: Plastics;  Laterality: Bilateral;    Social History   Socioeconomic History   Marital status: Married    Spouse name: Not on file   Number of children: Not on file   Years of education: Not on file   Highest education level: Not on file  Occupational History   Not on file  Tobacco Use   Smoking status: Never   Smokeless tobacco: Never  Vaping Use   Vaping Use: Never used  Substance and Sexual  Activity   Alcohol use: Not Currently    Comment: occassinally    Drug use: Yes    Types: Marijuana    Comment: occ   Sexual activity: Yes  Other Topics Concern   Not on file  Social History Narrative   Not on file   Social Determinants of Health   Financial Resource Strain: Not on file  Food Insecurity: Not on file  Transportation Needs: Unmet Transportation Needs (06/22/2022)   PRAPARE - Hydrologist (Medical): Yes    Lack of Transportation (Non-Medical): Yes  Physical Activity: Not on file  Stress: Not on  file  Social Connections: Not on file  Intimate Partner Violence: Not on file    Family History  Problem Relation Age of Onset   Diabetes Father    Varicose Veins Father    Diabetes Paternal Aunt    Uterine cancer Paternal Aunt        precancerous   Diabetes Paternal Grandmother    Uterine cancer Paternal Grandmother        precancerous   Thyroid disease Mother    Thyroid disease Maternal Aunt    Thyroid disease Maternal Grandmother    Cancer Other        stomach vs ovarian/cervical   Cancer Paternal Great-grandmother        unk     Current Outpatient Medications:    busPIRone (BUSPAR) 15 MG tablet, Take 1 tablet by mouth 2 (two) times daily., Disp: , Rfl:    citalopram (CELEXA) 20 MG tablet, TAKE 1 TABLET BY MOUTH EVERY DAY, Disp: 90 tablet, Rfl: 1   clonazePAM (KLONOPIN) 0.5 MG tablet, Take 1 tablet (0.5 mg total) by mouth 3 (three) times daily as needed., Disp: 60 tablet, Rfl: 0   gabapentin (NEURONTIN) 300 MG capsule, Take 1 capsule (300 mg total) by mouth 3 (three) times daily. (Patient taking differently: Take 300 mg by mouth 3 (three) times daily. PT STATES 600MG IN PM), Disp: 42 capsule, Rfl: 3   lidocaine-prilocaine (EMLA) cream, Apply 1 application topically daily as needed (prior to port access)., Disp: 30 g, Rfl: 3   Multiple Vitamin (MULTIVITAMIN WITH MINERALS) TABS tablet, Take 1 tablet by mouth daily., Disp: , Rfl:    mycophenolate (CELLCEPT) 250 MG capsule, Take by mouth., Disp: , Rfl:    dapsone 100 MG tablet, TAKE 1 TABLET BY MOUTH EVERY DAY (Patient not taking: Reported on 08/24/2022), Disp: 30 tablet, Rfl: 1   docusate sodium (COLACE) 100 MG capsule, Take 100 mg by mouth daily as needed for mild constipation. (Patient not taking: Reported on 08/24/2022), Disp: , Rfl:    ondansetron (ZOFRAN) 8 MG tablet, Take 1 tablet (8 mg total) by mouth every 8 (eight) hours as needed for refractory nausea / vomiting. Start on day 3 after chemo. (Patient not taking: Reported  on 08/24/2022), Disp: 60 tablet, Rfl: 3   oxyCODONE (OXY IR/ROXICODONE) 5 MG immediate release tablet, Take 1 tablet (5 mg total) by mouth 2 (two) times daily. (Patient not taking: Reported on 08/24/2022), Disp: 60 tablet, Rfl: 0   pantoprazole (PROTONIX) 40 MG tablet, Take 40 mg by mouth daily. (Patient not taking: Reported on 08/24/2022), Disp: , Rfl:    TURMERIC CURCUMIN PO, Take 2 capsules by mouth daily. (Patient not taking: Reported on 08/24/2022), Disp: , Rfl:  No current facility-administered medications for this visit.  Facility-Administered Medications Ordered in Other Visits:    prochlorperazine (COMPAZINE) tablet 10 mg, 10 mg, Oral,  Q6H PRN, Sindy Guadeloupe, MD, 10 mg at 07/16/21 1407  Physical exam:  Vitals:   08/24/22 1038  BP: 107/64  Pulse: 78  Resp: 18  Temp: (!) 97 F (36.1 C)  SpO2: 100%  Weight: 202 lb 8 oz (91.9 kg)   Physical Exam Constitutional:      General: She is not in acute distress. Cardiovascular:     Rate and Rhythm: Normal rate and regular rhythm.     Heart sounds: Normal heart sounds.  Pulmonary:     Effort: Pulmonary effort is normal.     Breath sounds: Normal breath sounds.  Abdominal:     General: Bowel sounds are normal.     Palpations: Abdomen is soft.  Skin:    General: Skin is warm and dry.  Neurological:     Mental Status: She is alert and oriented to person, place, and time.   Breast exam: Patient is s/p bilateral mastectomy without reconstruction.  No evidence of chest wall recurrence.  No palpable bilateral axillary adenopathy.     Latest Ref Rng & Units 08/24/2022   10:16 AM  CMP  Glucose 70 - 99 mg/dL 146   BUN 6 - 20 mg/dL 13   Creatinine 0.44 - 1.00 mg/dL 0.68   Sodium 135 - 145 mmol/L 134   Potassium 3.5 - 5.1 mmol/L 4.0   Chloride 98 - 111 mmol/L 103   CO2 22 - 32 mmol/L 25   Calcium 8.9 - 10.3 mg/dL 8.8   Total Protein 6.5 - 8.1 g/dL 7.1   Total Bilirubin 0.3 - 1.2 mg/dL 0.4   Alkaline Phos 38 - 126 U/L 91   AST  15 - 41 U/L 34   ALT 0 - 44 U/L 26       Latest Ref Rng & Units 08/24/2022   10:16 AM  CBC  WBC 4.0 - 10.5 K/uL 5.3   Hemoglobin 12.0 - 15.0 g/dL 12.7   Hematocrit 36.0 - 46.0 % 38.6   Platelets 150 - 400 K/uL 181     No images are attached to the encounter.  No results found.   Assessment and plan- Patient is a 31 y.o. female who is here for follow-up of following issues:  History of breast cancerS/p chemotherapy as per keynote 522 regimen.  She had a complete pathological response then to [facility] for 5 cycles ending in January 2023.  After that Beryle Flock was stopped due to autoimmune hepatitis.  Clinically she is otherwise doing well with no concerning signs and symptoms of recurrence based on today's exam.  I will see her back in 6 months.  She is already status post bilateral mastectomy and therefore no role for any mammograms at this time.  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  I did reach out to Dr. Merrilee Jansky today and [facility] will manage her MMF dosing.  LFTs have now normalized  We will repeat CMP in 2 4 and 6 months and I will see her back in 6 months   Visit Diagnosis 1. Encounter for follow-up surveillance of breast cancer   2. Autoimmune hepatitis ([place])   3. Chemotherapy-induced peripheral neuropathy ([place])   4. High risk medication use      Dr. Randa Evens, MD, MPH [facility] at [facility] [phone] 08/24/2022 2:16 PM

## 2022-08-25 ENCOUNTER — Other Ambulatory Visit: Payer: Self-pay | Admitting: *Deleted

## 2022-08-25 ENCOUNTER — Other Ambulatory Visit: Payer: Self-pay

## 2022-08-25 NOTE — Addendum Note (Signed)
Addended by: Ansel Bong on: 08/25/2022 04:01 PM   Modules accepted: Orders

## 2022-08-29 ENCOUNTER — Other Ambulatory Visit: Payer: Self-pay

## 2022-09-02 ENCOUNTER — Encounter: Payer: Self-pay | Admitting: Occupational Therapy

## 2022-09-02 ENCOUNTER — Ambulatory Visit: Payer: Commercial Managed Care - HMO | Admitting: Occupational Therapy

## 2022-09-02 DIAGNOSIS — I972 Postmastectomy lymphedema syndrome: Secondary | ICD-10-CM | POA: Diagnosis not present

## 2022-09-02 NOTE — Therapy (Signed)
OUTPATIENT OCCUPATIONAL THERAPY  EVALUATION UPPER EXTREMITY LYMPHEDEMA  Patient Name: Vickie Mathis MRN: [id number] DOB:1991/01/22, 31 y.o., female Today's Date: 09/02/2022  END OF SESSION:  OT End of Session - 09/02/22 1426     Visit Number 2    Number of Visits 8    Date for OT Re-Evaluation 11/22/22    OT Start Time 0210    OT Stop Time 0315    OT Time Calculation (min) 65 min    Activity Tolerance Patient tolerated treatment well;No increased pain    Behavior During Therapy WFL for tasks assessed/performed               Past Medical History:  Diagnosis Date   Anxiety    Asthma    Breast cancer ([place]) 11/2020   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 AXILLARY NODE DISSECTION;  Surgeon: Herbert Pun, MD;  Location: [facility] ORS;  Service: General;  Laterality: Bilateral;  Dillingham, 1.5 hours Cintron-Diaz 2.5 hours   BREAST BIOPSY Left 11/26/2020   vision 12:00 6cmfn [facility]   BREAST BIOPSY Left 11/26/2020   LN bx, hydro marker,  fragments of macrometastatic carcinoma   BREAST RECONSTRUCTION WITH PLACEMENT OF TISSUE EXPANDER AND FLEX HD (ACELLULAR HYDRATED DERMIS) Bilateral 06/25/2021   Procedure: BREAST RECONSTRUCTION WITH PLACEMENT OF TISSUE EXPANDER AND FLEX HD (ACELLULAR HYDRATED DERMIS);  Surgeon: Wallace Going, DO;  Location: [facility] ORS;  Service: Plastics;  Laterality: Bilateral;   PORTACATH PLACEMENT Right 12/13/2020   Procedure: INSERTION PORT-A-CATH;  Surgeon: Herbert Pun, MD;  Location: [facility] ORS;  Service: General;  Laterality: Right;   REMOVAL OF BILATERAL TISSUE EXPANDERS WITH  PLACEMENT OF BILATERAL BREAST IMPLANTS Bilateral 09/15/2021   Procedure: REMOVAL OF BILATERAL TISSUE EXPANDERS;  Surgeon: Wallace Going, DO;  Location: [place];  Service: Plastics;  Laterality: Bilateral;   Patient Active Problem List   Diagnosis Date Noted   Chemotherapy-induced neuropathy ([place]) 02/06/2022   Right sided weakness 02/06/2022   Transaminitis 11/20/2021   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 10/25/2021   Malignant neoplasm of left breast in female, estrogen receptor positive ([place]) 06/25/2021   Genetic testing 01/15/2021   Family history of cancer    Invasive carcinoma of breast ([place]) 12/03/2020   Breast mass, left 07/01/2020   Situational mixed anxiety and depressive disorder 07/01/2020   Anxiety state 08/06/2014   Varicose veins of lower extremity 08/06/2014    PCP: Juluis Pitch, MD   REFERRING PROVIDER: Sindy Guadeloupe, MD  REFERRING DIAG: 197.2  THERAPY DIAG: Postmastectomy lymphedema syndrome  ONSET DATE: 9/22  Rationale for Evaluation and Treatment: Rehabilitation  SUBJECTIVE  SUBJECTIVE STATEMENT: Vickie Mathis   PERTINENT HISTORY: 2021 self palpated L breast mass. Diagnostic mammogram and ultrasound revealed 2.7 cm mass at 12 o'clock 6 cm from the nipple. L axillary ultrasound revealed 2 suspicious LN. Both breast mass and LN + for invasive mammary carcinoma grade 3, ER/PR and her 2 negative.R breast non-mass enhancement was negative for Br Ca.Pt underwent neoadjuvant chemotherapy. 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 She underwent bilateral mastectomy with reconstruction in 9/22.Pathology revealed complete pathological response with 2 LN negative and R breast negative for malignancy. Breast  implants removed 2/2 staff infection. Anxiety, chemo-induced neuropathy, intermittent L chest wall pain,   IMPAIRMENTS: chronic pain L chest wall and lateral trunk s/p bilateral mastectomy and ALND, fatigue, altered sensation 2/2 chemo-induced neuropathy   FUNCTIONAL LIMITATIONS: pain at end ranges BUE shoulders, R>L; decreased hand function, limited participation in productive activities and work while recovering from cancer treatment.  OTHER OBSTACLES TO OT LE CARE: none identified at initial eval  PAIN:  Are you having pain? Yes NPRS scale: 1-2/10 Pain location: L lateral trunk and chest at mastectomy site PAIN TYPE: chronic Pain description: intermittent  Aggravating factors: lifting, carrying, pushing, pulling, reaching overhead Relieving factors: unknown   PRECAUTIONS: Other: lymphedema precautions  FALLS:  Has patient fallen in last 6 months?  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 while undergoing Ca Rx  LEISURE: family  HAND DOMINANCE : right   PRIOR LEVEL OF FUNCTION: Independent  PATIENT GOALS: relieve post-mastectomy pain and limit LE progression   OBJECTIVE BLE COMPARATIVE LIMB VOLUMETRICS:   09/02/22 INITIAL LUE  LANDMARK LEFT  09/02/22  L MPs 20.5 cm  RUE (excluding hand)  2910.9 ml     Limb Volume differential (LVD)  %  Volume change since initial %  Volume change overall %  (Blank rows = not tested) :    PATIENT SURVEYS:  FOTO functional outcome measure: Intake TBA OT Rx visit 1   LYMPHEDEMA LIFE IMPACT SCALE: (The extent to which lymphedema-related problems affected your life last week)  Intake score: 20.59%    TODAY'S TREATMENT:                                                                                                                                        Pt edu  Anatomical measurements for prophylactic  compression arm sleeve Initial LUE volumetrics  PATIENT EDUCATION: Pt edu focused on purpose and benefits of completing initial LLE comparative limb volumetrics. Educated Pt re  measuring and fitting process of Off-the-shelf compression arm sleeve. Discussed online and lovcal vendor DM<E resources, and suggested filing claim personally , or use flex spending account, if she has one, if she purchases garment on line.  Person educated: Patient Education method:  Handouts Education comprehension: verbalized understanding, returned demonstration, and needs further education  HOME EXERCISE PROGRAM: 1.Therapeutic lymphatic pumping therex- 2 sets of 10 reps, hold 5 seconds; elements in order. 2 x daily PRN 2.  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 3. Daily skin care to sustain optimal hydration. Wash bites, scratches, blisters, etc with mild soap and water , apply antibacterial first aide cream and a band aide. 4. Simple self-Manual lymphatic drainage (MLD) PRN.  ASSESSMENT:  CLINICAL IMPRESSION: Initial LUE volumetrics reveal total LUE Rx)  limb volume measures 2910.9 ml. Recommended prophylactic compression arm sleeve is Juzo SOFT, ccl 1 (20-30 mmHg), Size 5, reg length. Provided local and on line vendor resources and discussed filing insurance independently PRN. Discussed wear schuedule and care of garment. Good return.   OBJECTIVE IMPAIRMENTS: decreased knowledge of lymphedema ;  decreased knowledge of use of DME, decreased ROM, increased edema, impaired sensation, and pain with onset in   ACTIVITY LIMITATIONS: carrying, lifting, bed mobility, bathing, dressing, reach over head, and hygiene/grooming  PARTICIPATION LIMITATIONS: occupation  PERSONAL FACTORS:  Hx comorbid anxiety  is also affecting patient's functional outcome.   REHAB POTENTIAL: Good  CLINICAL  DECISION MAKING: Stable/uncomplicated  EVALUATION COMPLEXITY: Low  GOALS: Goals reviewed with patient? Yes  LONG TERM GOALS: Target date: 11/22/21   Given this patient's Intake score of 20.59/100% on the Lymphedema Life Impact Scale (LLIS), patient will experience a reduction of at least 5% in her perceived level of functional impairment resulting from lymphedema to improve functional performance and quality of life (QOL). Baseline: 20.59%  Goal status: INITIAL  2.  Given this patient's Intake score of TBA/100% on the functional outcomes FOTO tool, patient will experience an increase in function of 5 points  to improve basic and instrumental ADLs performance, including lymphedema self-care. Baseline:  TBA Goal status: INITIAL  3.   Pt will demonstrate understanding of lymphedema precautions and prevention strategies with modified independence using a printed reference to identify at least 5 precautions and discussing how s/he may implement them into daily life to reduce risk of progression and to limit infection risk. Baseline: Max A Goal status: INITIAL  4.  Pt will be able to don and doff prophylactic compression arm sleeve using assistive devices and extra time (modified independent), and discuss wear and garment care recommendations  to limit lymphedema by issue date. Baseline: dependent Goal status: INITIAL  5.  Pt will demonstrate the ability to perform all lymphedema self-care home program components with printed reference sheets (modified independence) by DC to limit lymphedema progression Baseline: dependent Goal status: INITIAL  6.  Pt will report 0/10 shoulder pain with AROM in all planes at end ranges bilaterally against gravity by DC for optimal functional performance of basic and instrumental ADLs, work and productive activities, leisure pursuits and social participation, Baseline: 2/10 Goal status: INITIAL  PLAN:  PT FREQUENCY: 1-2x/week  PT DURATION: 8  weeks  PLANNED INTERVENTIONS: Therapeutic exercises, Therapeutic activity, Neuromuscular re-education, Balance training, Gait training, Patient/Family education, Self Care, and Joint mobilization  PLAN FOR NEXT SESSION:  complete initial trunk and chest circumferential measurements Detemine size for JoviPak convoluted foam insert to limit fibrosis and soften scarred tissue at mastectomy sites and L lateral trunk and axilla. Complete anatomical measurements for comprssion bra-Belisse Teach lymphatic pumping therex and stretching   Andrey Spearman, MS, OTR/L, CLT-LANA 09/02/22 3:29  PM

## 2022-09-04 ENCOUNTER — Encounter: Payer: Self-pay | Admitting: Occupational Therapy

## 2022-09-04 ENCOUNTER — Ambulatory Visit: Payer: Commercial Managed Care - HMO | Attending: Oncology | Admitting: Occupational Therapy

## 2022-09-04 DIAGNOSIS — I972 Postmastectomy lymphedema syndrome: Secondary | ICD-10-CM | POA: Diagnosis present

## 2022-09-04 NOTE — Therapy (Signed)
OUTPATIENT OCCUPATIONAL THERAPY  EVALUATION UPPER EXTREMITY LYMPHEDEMA  Patient Name: Vickie Mathis MRN: [id number] DOB:11-20-1990, 31 y.o., female Today's Date: 09/04/2022  END OF SESSION:  OT End of Session - 09/04/22 1230     Visit Number 3    Number of Visits 8    Date for OT Re-Evaluation 11/22/22    OT Start Time 1106    OT Stop Time 1215    OT Time Calculation (min) 69 min    Activity Tolerance Patient tolerated treatment well;No increased pain    Behavior During Therapy WFL for tasks assessed/performed               Past Medical History:  Diagnosis Date   Anxiety    Asthma    Breast cancer ([place]) 11/2020   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 AXILLARY NODE DISSECTION;  Surgeon: Herbert Pun, MD;  Location: [facility];  Service: General;  Laterality: Bilateral;  [place], 1.5 hours Cintron-Diaz 2.5 hours   BREAST BIOPSY Left 11/26/2020   vision 12:00 6cmfn [facility]   BREAST BIOPSY Left 11/26/2020   LN bx, hydro marker,  fragments of macrometastatic carcinoma   BREAST RECONSTRUCTION WITH PLACEMENT OF TISSUE EXPANDER AND FLEX HD (ACELLULAR HYDRATED DERMIS) Bilateral 06/25/2021   Procedure: BREAST RECONSTRUCTION WITH PLACEMENT OF TISSUE EXPANDER AND FLEX HD (ACELLULAR HYDRATED DERMIS);  Surgeon: Wallace Going, DO;  Location: [facility];  Service: Plastics;  Laterality: Bilateral;   PORTACATH PLACEMENT Right 12/13/2020   Procedure: INSERTION PORT-A-CATH;  Surgeon: Herbert Pun, MD;  Location: [facility];  Service: General;  Laterality: Right;   REMOVAL OF BILATERAL TISSUE EXPANDERS WITH  PLACEMENT OF BILATERAL BREAST IMPLANTS Bilateral 09/15/2021   Procedure: REMOVAL OF BILATERAL TISSUE EXPANDERS;  Surgeon: Wallace Going, DO;  Location: [place];  Service: Plastics;  Laterality: Bilateral;   Patient Active Problem List   Diagnosis Date Noted   Chemotherapy-induced neuropathy ([place]) 02/06/2022   Right sided weakness 02/06/2022   Transaminitis 11/20/2021   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 10/25/2021   Malignant neoplasm of left breast in female, estrogen receptor positive ([place]) 06/25/2021   Genetic testing 01/15/2021   Family history of cancer    Invasive carcinoma of breast ([place]) 12/03/2020   Breast mass, left 07/01/2020   Situational mixed anxiety and depressive disorder 07/01/2020   Anxiety state 08/06/2014   Varicose veins of lower extremity 08/06/2014    PCP: Juluis Pitch, MD   REFERRING PROVIDER: Sindy Guadeloupe, MD  REFERRING DIAG: 197.2  THERAPY DIAG: Postmastectomy lymphedema syndrome  ONSET DATE: 9/22  Rationale for Evaluation and Treatment: Rehabilitation  SUBJECTIVE  SUBJECTIVE STATEMENT: Vickie Mathis presents to OT to address LUE/LUQ post-mastectomy lymphedema. Pt reports 4/10 pain in L lateral trunk adjacent to axilla in serratus region and at L chest wall surgical site. Pt reports fingertips feel numb when fully abducting BUE w elbows extended.   PERTINENT HISTORY: 2021 self palpated L breast mass. Diagnostic mammogram and ultrasound revealed 2.7 cm mass at 12 o'clock 6 cm from the nipple. L axillary ultrasound revealed 2 suspicious LN. Both breast mass and LN + for invasive mammary carcinoma grade 3, ER/PR and her 2 negative.R breast non-mass enhancement was negative for Br Ca.Pt underwent neoadjuvant chemotherapy. 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 She underwent bilateral mastectomy with reconstruction in 9/22.Pathology revealed complete pathological response with 2 LN negative and R breast negative for malignancy. Breast implants removed 2/2 staff infection. Anxiety, chemo-induced neuropathy, intermittent L chest wall pain,   IMPAIRMENTS: chronic pain L chest wall and lateral trunk s/p bilateral mastectomy and ALND, fatigue, altered sensation 2/2 chemo-induced neuropathy   FUNCTIONAL LIMITATIONS: pain at end ranges BUE shoulders, R>L; decreased hand function, limited participation in productive activities and work while recovering from cancer treatment.  OTHER OBSTACLES TO OT LE CARE: none identified at initial eval  PAIN:  Are you having pain? Yes NPRS scale: 4/10 Pain location: L lateral trunk and chest at mastectomy site PAIN TYPE: chronic Pain description: intermittent , sore, tender, tight numb Aggravating factors: lifting, carrying, pushing, pulling, reaching overhead Relieving factors: unknown   PRECAUTIONS: Other: lymphedema precautions   PATIENT GOALS: relieve post-mastectomy pain and limit LE progression   OBJECTIVE BLE COMPARATIVE LIMB VOLUMETRICS:   09/02/22 INITIAL LUE  LANDMARK LEFT  09/02/22  L MPs 20.5 cm  RUE (excluding hand)  2910.9 ml     Limb Volume differential (LVD)  %  Volume change since initial %  Volume change overall %  (Blank rows = not tested) :    PATIENT SURVEYS:  FOTO functional outcome measure: Intake TBA OT Rx visit 1   LYMPHEDEMA LIFE IMPACT SCALE: (The extent to which lymphedema-related problems affected your life last week)  Intake score: 20.59%    TODAY'S TREATMENT:                                                                                                                                        Pt edu  Anatomical measurements for prophylactic compression arm sleeve Initial LUE  volumetrics  PATIENT EDUCATION: Pt edu focused on purpose and benefits of completing initial LLE comparative limb volumetrics. Educated Pt re  measuring and fitting process of Off-the-shelf compression arm sleeve. Discussed online and lovcal vendor DM<E resources, and suggested filing claim personally , or use flex spending account, if she has one, if she purchases garment on line.  Person educated: Patient Education method: Handouts Education comprehension: verbalized understanding, returned demonstration, and needs further education  HOME EXERCISE PROGRAM: 1.Therapeutic lymphatic pumping therex- 2 sets of 10 reps, hold 5 seconds; elements in order. 2 x daily PRN 2.  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 3. Daily skin care to sustain optimal hydration. Wash bites, scratches, blisters, etc with mild soap and water , apply antibacterial first aide cream and a band aide. 4. Simple self-Manual lymphatic drainage (MLD) PRN.  ASSESSMENT:  CLINICAL IMPRESSION: Completed anatomical measurements for Belisse compression bra ( 39 A/B, and Jovipak bilateral mastectomy pads ( to limit fibrosis formation and facilitate improved lymphatic function during HOS) . Pt needs 2 of each , one to wash and one to wear, for optimal hygiene and to limit infection risk. Jeanelle Malling should be replaced q 3-6 months and PRN for optimal effectiveness. Convoluted foam Jovipak pads should be replaced yearly. Pt educated to perform lymphatic pumping there ex for BUE/BUQ. Excellent return. Handout given for reference. Cont as per POC.  OBJECTIVE IMPAIRMENTS: decreased knowledge of lymphedema ;  decreased knowledge of use of DME, decreased ROM, increased edema, impaired sensation, and pain with onset in   ACTIVITY LIMITATIONS: carrying, lifting, bed mobility, bathing, dressing, reach over head, and  hygiene/grooming  PARTICIPATION LIMITATIONS: occupation  PERSONAL FACTORS:  Hx comorbid anxiety  is also affecting patient's functional outcome.   REHAB POTENTIAL: Good  CLINICAL DECISION MAKING: Stable/uncomplicated   GOALS: Goals reviewed with patient? Yes  LONG TERM GOALS: Target date: 11/22/21   Given this patient's Intake score of 20.59/100% on the Lymphedema Life Impact Scale (LLIS), patient will experience a reduction of at least 5% in her perceived level of functional impairment resulting from lymphedema to improve functional performance and quality of life (QOL). Baseline: 20.59%  Goal status: INITIAL  2.  Given this patient's Intake score of TBA/100% on the functional outcomes FOTO tool, patient will experience an increase in function of 5 points  to improve basic and instrumental ADLs performance, including lymphedema self-care. Baseline:  TBA Goal status: INITIAL  3.   Pt will demonstrate understanding of lymphedema precautions and prevention strategies with modified independence using a printed reference to identify at least 5 precautions and discussing how s/he may implement them into daily life to reduce risk of progression and to limit infection risk. Baseline: Max A Goal status: INITIAL  4.  Pt will be able to don and doff prophylactic compression arm sleeve using assistive devices and extra time (modified independent), and discuss wear and garment care recommendations  to limit lymphedema by issue date. Baseline: dependent Goal status: INITIAL  5.  Pt will demonstrate the ability to perform all lymphedema self-care home program components with printed reference sheets (modified independence) by DC to limit lymphedema progression Baseline: dependent Goal status: INITIAL  6.  Pt will report 0/10 shoulder pain with AROM in all planes at end ranges bilaterally against gravity by DC for optimal functional performance of basic and instrumental ADLs, work and productive  activities, leisure pursuits and social participation, Baseline: 2/10 Goal status: INITIAL  PLAN:  PT FREQUENCY: 1-2x/week  PT DURATION: 8 weeks  PLANNED INTERVENTIONS: Therapeutic exercises, Therapeutic activity, Neuromuscular re-education, Balance training, Gait training, Patient/Family education, Self Care, and Joint mobilization  PLAN FOR NEXT SESSION:  Commence LUE/LUQ MLD , scar massage and myofascial release Cont Pt edu for LE self care   Andrey Spearman, MS, OTR/L, CLT-LANA 09/04/22 12:38 PM

## 2022-09-07 ENCOUNTER — Ambulatory Visit: Payer: Commercial Managed Care - HMO | Admitting: Occupational Therapy

## 2022-09-07 DIAGNOSIS — I972 Postmastectomy lymphedema syndrome: Secondary | ICD-10-CM

## 2022-09-08 ENCOUNTER — Ambulatory Visit (INDEPENDENT_AMBULATORY_CARE_PROVIDER_SITE_OTHER): Payer: Commercial Managed Care - HMO | Admitting: Plastic Surgery

## 2022-09-08 ENCOUNTER — Encounter: Payer: Self-pay | Admitting: Plastic Surgery

## 2022-09-08 DIAGNOSIS — Z9013 Acquired absence of bilateral breasts and nipples: Secondary | ICD-10-CM

## 2022-09-08 NOTE — Progress Notes (Signed)
   Subjective:    Patient ID: Vickie Mathis, female    DOB: 02-23-1991, 31 y.o.   MRN: [id number]  The patient is a 31 year old female joining me on phone for further discussion about her breasts.  Is that she is still dealing with her other medical conditions.  She is willing to see a consult for a DIEP.     Review of Systems  Constitutional: Negative.   Eyes: Negative.   Respiratory: Negative.    Cardiovascular: Negative.   Gastrointestinal: Negative.   Endocrine: Negative.   Genitourinary: Negative.        Objective:   Physical Exam        Assessment & Plan:     ICD-10-CM   1. 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 The patient was at home and I was at the office.  We spent 5 minutes in discussion.  I will send the referral for Vickie Mathis.  I discussed the limitations of evaluation and management by telemedicine. The patient expressed understanding and agreed to proceed.

## 2022-09-09 ENCOUNTER — Other Ambulatory Visit: Payer: Self-pay | Admitting: Physician Assistant

## 2022-09-09 ENCOUNTER — Encounter: Payer: Self-pay | Admitting: Occupational Therapy

## 2022-09-09 ENCOUNTER — Ambulatory Visit: Payer: Commercial Managed Care - HMO | Admitting: Occupational Therapy

## 2022-09-09 DIAGNOSIS — Z9889 Other specified postprocedural states: Secondary | ICD-10-CM

## 2022-09-09 DIAGNOSIS — I972 Postmastectomy lymphedema syndrome: Secondary | ICD-10-CM | POA: Diagnosis not present

## 2022-09-09 NOTE — Therapy (Signed)
OUTPATIENT OCCUPATIONAL THERAPY  TREATMENT NOTE [place] POST-MASTECTOMY LYMPHEDEMA  Patient Name: Vickie Mathis MRN: [id number] DOB:May 31, 1991, 31 y.o., female Today's Date: 09/09/2022  END OF SESSION:          Past Medical History:  Diagnosis Date   Anxiety    Asthma    Breast cancer ([place]) 11/2020   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 AXILLARY NODE DISSECTION;  Surgeon: Herbert Pun, MD;  Location: [facility];  Service: General;  Laterality: Bilateral;  [place], 1.5 hours Cintron-Diaz 2.5 hours   BREAST BIOPSY Left 11/26/2020   vision 12:00 6cmfn [facility]   BREAST BIOPSY Left 11/26/2020   LN bx, hydro marker,  fragments of macrometastatic carcinoma   BREAST RECONSTRUCTION WITH PLACEMENT OF TISSUE EXPANDER AND FLEX HD (ACELLULAR HYDRATED DERMIS) Bilateral 06/25/2021   Procedure: BREAST RECONSTRUCTION WITH PLACEMENT OF TISSUE EXPANDER AND FLEX HD (ACELLULAR HYDRATED DERMIS);  Surgeon: Wallace Going, DO;  Location: [facility];  Service: Plastics;  Laterality: Bilateral;   PORTACATH PLACEMENT Right 12/13/2020   Procedure: INSERTION PORT-A-CATH;  Surgeon: Herbert Pun, MD;  Location: [facility];  Service: General;  Laterality: Right;   REMOVAL OF BILATERAL TISSUE EXPANDERS WITH PLACEMENT OF BILATERAL BREAST IMPLANTS Bilateral 09/15/2021   Procedure: REMOVAL OF BILATERAL TISSUE EXPANDERS;  Surgeon: Wallace Going, DO;  Location: [place];  Service: Plastics;  Laterality: Bilateral;   Patient Active Problem List   Diagnosis Date Noted   Chemotherapy-induced neuropathy ([place]) 02/06/2022    Right sided weakness 02/06/2022   Transaminitis 11/20/2021   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 10/25/2021   Malignant neoplasm of left breast in female, estrogen receptor positive ([place]) 06/25/2021   Genetic testing 01/15/2021   Family history of cancer    Invasive carcinoma of breast ([place]) 12/03/2020   Breast mass, left 07/01/2020   Situational mixed anxiety and depressive disorder 07/01/2020   Anxiety state 08/06/2014   Varicose veins of lower extremity 08/06/2014    PCP: Juluis Pitch, MD   REFERRING PROVIDER: Sindy Guadeloupe, MD  REFERRING DIAG: 197.2  THERAPY DIAG: Post-mastectomy lymphedema syndrome  ONSET DATE: 9/22  Rationale for Evaluation and Treatment: Rehabilitation  SUBJECTIVE  SUBJECTIVE STATEMENT: Vickie Mathis presents to OT to address LUE/LUQ post-mastectomy lymphedema. Pt reports 4/10 pain in L lateral trunk adjacent to axilla in serratus region and at L chest wall surgical site. Pt reports fingertips feel numb when fully abducting BUE w elbows extended.   PERTINENT HISTORY: 2021 self palpated L breast mass. Diagnostic mammogram and ultrasound revealed 2.7 cm mass at 12 o'clock 6 cm from the nipple. L axillary ultrasound revealed 2 suspicious LN. Both breast mass and LN + for invasive mammary carcinoma grade 3, ER/PR and her 2 negative.R breast non-mass enhancement was negative for Br Ca.Pt underwent neoadjuvant chemotherapy. 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 She underwent bilateral mastectomy with reconstruction in 9/22.Pathology revealed complete pathological response with 2 LN negative and R breast negative for malignancy. Breast implants removed 2/2 staff infection. Anxiety, chemo-induced neuropathy, intermittent L  chest wall pain,   IMPAIRMENTS: chronic pain L chest wall and lateral trunk s/p bilateral mastectomy and ALND, fatigue, altered sensation 2/2 chemo-induced neuropathy   FUNCTIONAL LIMITATIONS: pain at end ranges BUE shoulders, R>L; decreased hand function, limited participation in productive activities and work while recovering from cancer treatment.  OTHER OBSTACLES TO OT LE CARE: none identified at initial eval  PAIN:  Are you having pain? Yes NPRS scale: 4/10 Pain location: L lateral trunk and chest at mastectomy site PAIN TYPE: chronic Pain description: intermittent , sore, tender, tight numb Aggravating factors: lifting, carrying, pushing, pulling, reaching overhead Relieving factors: unknown   PRECAUTIONS: Other: lymphedema precautions   PATIENT GOALS: relieve post-mastectomy pain and limit LE progression   OBJECTIVE BLE COMPARATIVE LIMB VOLUMETRICS:   09/02/22 INITIAL LUE  LANDMARK LEFT  09/02/22  L MPs 20.5 cm  RUE (excluding hand)  2910.9 ml     Limb Volume differential (LVD)  %  Volume change since initial %  Volume change overall %  (Blank rows = not tested) :    PATIENT SURVEYS:  FOTO functional outcome measure: Intake TBA OT Rx visit 1   LYMPHEDEMA LIFE IMPACT SCALE: (The extent to which lymphedema-related problems affected your life last week)  Intake score: 20.59%    TODAY'S TREATMENT:                                                                                                                                        Pt edu  Anatomical measurements for Belisse compression bra ( 39 a/b) and a Jovipak double mastectomy pad Initial LUE volumetrics LUE/LUQ MLD as established  PATIENT EDUCATION: Continued skilled Pt/caregiver education  And LE ADL training throughout visit for lymphedema self care/ home program, including compression wrapping, compression garment and device wear/care, lymphatic pumping ther ex, simple self-MLD, and skin care. Discussed  initial volumetric measurements. Discussed all long term goals.   Person educated: Patient Education method: Handouts Education comprehension: verbalized understanding, returned demonstration, and needs further  education  HOME EXERCISE PROGRAM: 1.Therapeutic lymphatic pumping therex- 2 sets of 10 reps, hold 5 seconds; elements in order. 2 x daily PRN 2.  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 3. Daily skin care to sustain optimal hydration. Wash bites, scratches, blisters, etc with mild soap and water , apply antibacterial first aide cream and a band aide. 4. Simple self-Manual lymphatic drainage (MLD) PRN.  ASSESSMENT:  CLINICAL IMPRESSION: Completed anatomical measurements for Belisse compression bra ( 39 A/B), and Jovipak bilateral mastectomy pad ( to limit fibrosis formation and facilitate improved lymphatic function during HOS) . Pt needs 2 of each , one to wash and one to wear, for optimal hygiene and to limit infection risk. Jeanelle Malling should be replaced q 3-6 months and PRN for optimal effectiveness. Convoluted foam Jovipak pads should be replaced yearly. Pt tolerated LUQ MLD without difficultyCont as per POC.  OBJECTIVE IMPAIRMENTS: decreased knowledge of lymphedema ;  decreased knowledge of use of DME, decreased ROM, increased edema, impaired sensation, and pain with onset in   ACTIVITY LIMITATIONS: carrying, lifting, bed mobility, bathing, dressing, reach over head, and hygiene/grooming  PARTICIPATION LIMITATIONS: occupation  PERSONAL FACTORS:  Hx comorbid anxiety  is also affecting patient's functional outcome.   REHAB POTENTIAL: Good  CLINICAL DECISION MAKING: Stable/uncomplicated   GOALS: Goals reviewed with patient? Yes  LONG TERM GOALS: Target date: 11/22/21   Given this patient's Intake score of 20.59/100% on the Lymphedema Life Impact Scale  (LLIS), patient will experience a reduction of at least 5% in her perceived level of functional impairment resulting from lymphedema to improve functional performance and quality of life (QOL). Baseline: 20.59%  Goal status: INITIAL  2.  Given this patient's Intake score of TBA/100% on the functional outcomes FOTO tool, patient will experience an increase in function of 5 points  to improve basic and instrumental ADLs performance, including lymphedema self-care. Baseline:  TBA Goal status: INITIAL  3.   Pt will demonstrate understanding of lymphedema precautions and prevention strategies with modified independence using a printed reference to identify at least 5 precautions and discussing how s/he may implement them into daily life to reduce risk of progression and to limit infection risk. Baseline: Max A Goal status: INITIAL  4.  Pt will be able to don and doff prophylactic compression arm sleeve using assistive devices and extra time (modified independent), and discuss wear and garment care recommendations  to limit lymphedema by issue date. Baseline: dependent Goal status: INITIAL  5.  Pt will demonstrate the ability to perform all lymphedema self-care home program components with printed reference sheets (modified independence) by DC to limit lymphedema progression Baseline: dependent Goal status: INITIAL  6.  Pt will report 0/10 shoulder pain with AROM in all planes at end ranges bilaterally against gravity by DC for optimal functional performance of basic and instrumental ADLs, work and productive activities, leisure pursuits and social participation, Baseline: 2/10 Goal status: INITIAL  PLAN:  PT FREQUENCY: 1-2x/week  PT DURATION: 8 weeks  PLANNED INTERVENTIONS: Therapeutic exercises, Therapeutic activity, Neuromuscular re-education, Balance training, Gait training, Patient/Family education, Self Care, and Joint mobilization  PLAN FOR NEXT SESSION:  Commence LUE/LUQ MLD , scar  massage and myofascial release Cont Pt edu for LE self care  Andrey Spearman, MS, OTR/L, CLT-LANA 09/09/22 8:45 AM

## 2022-09-09 NOTE — Therapy (Signed)
OUTPATIENT OCCUPATIONAL THERAPY  TREATMENT NOTE [place] POST-MASTECTOMY LYMPHEDEMA  Patient Name: Vickie Mathis MRN: [id number] DOB:04-20-1991, 31 y.o., female Today's Date: 09/09/2022  END OF SESSION:  OT End of Session - 09/09/22 1318     Visit Number 5    Number of Visits 8    Date for OT Re-Evaluation 11/22/22    OT Start Time 0100    OT Stop Time 0215    OT Time Calculation (min) 75 min    Activity Tolerance Patient tolerated treatment well;No increased pain    Behavior During Therapy [facility] for tasks assessed/performed              OT End of Session - 09/09/22 1318     Visit Number 5    Number of Visits 8    Date for OT Re-Evaluation 11/22/22    OT Start Time 0100    OT Stop Time 0215    OT Time Calculation (min) 75 min    Activity Tolerance Patient tolerated treatment well;No increased pain    Behavior During Therapy WFL for tasks assessed/performed                   Past Medical History:  Diagnosis Date   Anxiety    Asthma    Breast cancer ([place]) 11/2020   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 AXILLARY NODE DISSECTION;  Surgeon: Herbert Pun, MD;  Location: [facility] ORS;  Service: General;  Laterality: Bilateral;  [place], 1.5 hours Cintron-Diaz 2.5 hours   BREAST BIOPSY Left 11/26/2020   vision 12:00 6cmfn [facility]   BREAST BIOPSY Left 11/26/2020   LN bx, hydro marker,  fragments of macrometastatic carcinoma   BREAST RECONSTRUCTION WITH PLACEMENT OF TISSUE EXPANDER AND FLEX HD (ACELLULAR HYDRATED DERMIS) Bilateral 06/25/2021   Procedure: BREAST RECONSTRUCTION WITH PLACEMENT OF TISSUE  EXPANDER AND FLEX HD (ACELLULAR HYDRATED DERMIS);  Surgeon: Wallace Going, DO;  Location: [facility] ORS;  Service: Plastics;  Laterality: Bilateral;   PORTACATH PLACEMENT Right 12/13/2020   Procedure: INSERTION PORT-A-CATH;  Surgeon: Herbert Pun, MD;  Location: [facility] ORS;  Service: General;  Laterality: Right;   REMOVAL OF BILATERAL TISSUE EXPANDERS WITH PLACEMENT OF BILATERAL BREAST IMPLANTS Bilateral 09/15/2021   Procedure: REMOVAL OF BILATERAL TISSUE EXPANDERS;  Surgeon: Wallace Going, DO;  Location: [place];  Service: Plastics;  Laterality: Bilateral;   Patient Active Problem List   Diagnosis Date Noted   Chemotherapy-induced neuropathy ([place]) 02/06/2022   Right sided weakness 02/06/2022   Transaminitis 11/20/2021   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 10/25/2021   Malignant neoplasm of left breast in female, estrogen receptor positive ([place]) 06/25/2021   Genetic testing 01/15/2021   Family history of cancer    Invasive carcinoma of breast ([place]) 12/03/2020   Breast mass, left 07/01/2020   Situational mixed anxiety and depressive disorder 07/01/2020   Anxiety state 08/06/2014   Varicose veins of lower extremity 08/06/2014    PCP: Juluis Pitch, MD   REFERRING PROVIDER: Sindy Guadeloupe, MD  REFERRING DIAG: 197.2  THERAPY DIAG: Post-mastectomy lymphedema syndrome  ONSET DATE: 9/22  Rationale  for Evaluation and Treatment: Rehabilitation  SUBJECTIVE                                                                                                                                                                                           SUBJECTIVE STATEMENT: Vickie Mathis presents to OT to address LUE/LUQ post-mastectomy lymphedema. Pt reports 4/10 pain in L lateral trunk adjacent to axilla in serratus region and at L chest wall surgical site. Pt reports fingertips feel numb when fully abducting BUE w elbows extended. Pt has no new concerns  today.  PERTINENT HISTORY: 2021 self palpated L breast mass. Diagnostic mammogram and ultrasound revealed 2.7 cm mass at 12 o'clock 6 cm from the nipple. L axillary ultrasound revealed 2 suspicious LN. Both breast mass and LN + for invasive mammary carcinoma grade 3, ER/PR and her 2 negative.R breast non-mass enhancement was negative for Br Ca.Pt underwent neoadjuvant chemotherapy. 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 She underwent bilateral mastectomy with reconstruction in 9/22.Pathology revealed complete pathological response with 2 LN negative and R breast negative for malignancy. Breast implants removed 2/2 staff infection. Anxiety, chemo-induced neuropathy, intermittent L chest wall pain,   IMPAIRMENTS: chronic pain L chest wall and lateral trunk s/p bilateral mastectomy and ALND, fatigue, altered sensation 2/2 chemo-induced neuropathy   FUNCTIONAL LIMITATIONS: pain at end ranges BUE shoulders, R>L; decreased hand function, limited participation in productive activities and work while recovering from cancer treatment.  OTHER OBSTACLES TO OT LE CARE: none identified at initial eval  PAIN:  Are you having pain? Yes NPRS scale: 4/10 Pain location: L lateral trunk and chest at mastectomy site PAIN TYPE: chronic Pain description: intermittent , sore, tender, tight numb Aggravating factors: lifting, carrying, pushing, pulling, reaching overhead Relieving factors: unknown   PRECAUTIONS: Other: lymphedema precautions   PATIENT GOALS: relieve post-mastectomy pain and limit LE progression   OBJECTIVE BLE COMPARATIVE LIMB VOLUMETRICS:   09/02/22 INITIAL LUE  LANDMARK LEFT  09/02/22  L MPs 20.5 cm  RUE (excluding hand)  2910.9 ml     Limb Volume differential (LVD)  %  Volume change since initial %  Volume change overall %  (Blank rows = not tested) :    PATIENT SURVEYS:  FOTO functional outcome measure: Intake TBA OT Rx visit 1    LYMPHEDEMA LIFE IMPACT SCALE: (The extent to which lymphedema-related problems affected your life last week)  Intake score: 20.59%    TODAY'S TREATMENT:  Pt edu LUE/LUQ MLD as established with simultaneous skin care Kinesio Tape  PATIENT EDUCATION: Continued skilled Pt/caregiver education  And LE ADL training throughout visit for lymphedema self care/ home program, including compression wrapping, kinesiotape, compression garment and device wear/care, lymphatic pumping ther ex, simple self-MLD, and skin care. Discussed initial volumetric measurements. Discussed all long term goals.   Person educated: Patient Education method: Handouts, demonstration Education comprehension: verbalized understanding, returned demonstration, and needs further education  HOME EXERCISE PROGRAM: 1.Therapeutic lymphatic pumping therex- 2 sets of 10 reps, hold 5 seconds; elements in order. 2 x daily PRN 2.  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 Belisse compression bra ( 39 a/b) and a Jovipak double mastectomy pad for HOS to reduce fibrosis and facilitate imprved lymphatic function to limit progression. 3. Daily skin care to sustain optimal hydration. Wash bites, scratches, blisters, etc with mild soap and water , apply antibacterial first aide cream and a band aide. 4. Simple self-Manual lymphatic drainage (MLD) PRN.  ASSESSMENT:  CLINICAL IMPRESSION: Pt tolerated MLD to LUQ in supine and side lying without increased pain. Pt able to stimulate inguinal LN using correct J stroke  technique when mobilizing fluid across axillary-inguinal anastomosis. Pt educated re use of Kinesiotape to passively stretch skin in affected quadrant in direction of functional LN.  Cleaned skin and applied two, 2" wide, 3-finger fan   cuts  - anchoring ot contralateral spine and extending fingers across lateral trunk, mastectomy scars and ending at sternum. Pt verbalized understanding of how to remove tape to limit injury to skin. Productive session.Cont as per POC.  OBJECTIVE IMPAIRMENTS: decreased knowledge of lymphedema ;  decreased knowledge of use of DME, decreased ROM, increased edema, impaired sensation, and pain with onset in   ACTIVITY LIMITATIONS: carrying, lifting, bed mobility, bathing, dressing, reach over head, and hygiene/grooming  PARTICIPATION LIMITATIONS: occupation  PERSONAL FACTORS:  Hx comorbid anxiety  is also affecting patient's functional outcome.   REHAB POTENTIAL: Good  CLINICAL DECISION MAKING: Stable/uncomplicated   GOALS: Goals reviewed with patient? Yes  LONG TERM GOALS: Target date: 11/22/21   Given this patient's Intake score of 20.59/100% on the Lymphedema Life Impact Scale (LLIS), patient will experience a reduction of at least 5% in her perceived level of functional impairment resulting from lymphedema to improve functional performance and quality of life (QOL). Baseline: 20.59%  Goal status: INITIAL  2.  Given this patient's Intake score of TBA/100% on the functional outcomes FOTO tool, patient will experience an increase in function of 5 points  to improve basic and instrumental ADLs performance, including lymphedema self-care. Baseline:  TBA Goal status: INITIAL  3.   Pt will demonstrate understanding of lymphedema precautions and prevention strategies with modified independence using a printed reference to identify at least 5 precautions and discussing how s/he may implement them into daily life to reduce risk of progression and to limit infection risk. Baseline: Max A Goal status: INITIAL  4.  Pt will be able to don and doff prophylactic compression arm sleeve using assistive devices and extra time (modified independent),  and discuss wear and garment care recommendations  to limit lymphedema by issue date. Baseline: dependent Goal status: INITIAL  5.  Pt will demonstrate the ability to perform all lymphedema self-care home program components with printed reference sheets (modified independence) by DC to limit lymphedema progression Baseline: dependent Goal status:  INITIAL  6.  Pt will report 0/10 shoulder pain with AROM in all planes at end ranges bilaterally against gravity by DC for optimal functional performance of basic and instrumental ADLs, work and productive activities, leisure pursuits and social participation, Baseline: 2/10 Goal status: INITIAL  PLAN:  PT FREQUENCY: 1-2x/week  PT DURATION: 8 weeks  PLANNED INTERVENTIONS: Therapeutic exercises, Therapeutic activity, Neuromuscular re-education, Balance training, Gait training, Patient/Family education, Self Care, and Joint mobilization  PLAN FOR NEXT SESSION:  Cont LUE/LUQ MLD , scar massage and myofascial release. Kinesiotape as tolerated Teach simple self-MLD Cont Pt edu for LE self care  Andrey Spearman, MS, OTR/L, CLT-LANA 09/09/22 2:50 PM

## 2022-09-11 ENCOUNTER — Other Ambulatory Visit: Payer: Self-pay | Admitting: Oncology

## 2022-09-15 ENCOUNTER — Ambulatory Visit: Payer: Commercial Managed Care - HMO | Admitting: Occupational Therapy

## 2022-09-15 DIAGNOSIS — I972 Postmastectomy lymphedema syndrome: Secondary | ICD-10-CM

## 2022-09-15 NOTE — Therapy (Signed)
OUTPATIENT OCCUPATIONAL THERAPY  TREATMENT NOTE [place] POST-MASTECTOMY LYMPHEDEMA  Patient Name: Vickie Mathis MRN: [id number] DOB:Mar 22, 1991, 31 y.o., female Today's Date: 09/15/2022  END OF SESSION:  OT End of Session - 09/15/22 0911     Visit Number 6    Number of Visits 8    Date for OT Re-Evaluation 11/22/22    OT Start Time 0910    OT Stop Time 1010    OT Time Calculation (min) 60 min    Activity Tolerance Patient tolerated treatment well;No increased pain    Behavior During Therapy [facility] for tasks assessed/performed              OT End of Session - 09/15/22 0911     Visit Number 6    Number of Visits 8    Date for OT Re-Evaluation 11/22/22    OT Start Time 0910    OT Stop Time 1010    OT Time Calculation (min) 60 min    Activity Tolerance Patient tolerated treatment well;No increased pain    Behavior During Therapy WFL for tasks assessed/performed                   Past Medical History:  Diagnosis Date   Anxiety    Asthma    Breast cancer ([place]) 11/2020   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 AXILLARY NODE DISSECTION;  Surgeon: Herbert Pun, MD;  Location: [facility] OR[facility];  Service: General;  Laterality: Bilateral;  [place], 1.5 hours Cintron-Diaz 2.5 hours   BREAST BIOPSY Left 11/26/2020   vision 12:00 6cmfn [place]   BREAST BIOPSY Left 11/26/2020   LN bx, hydro marker,  fragments of macrometastatic carcinoma   BREAST RECONSTRUCTION WITH PLACEMENT OF TISSUE EXPANDER AND FLEX HD (ACELLULAR HYDRATED DERMIS) Bilateral 06/25/2021   Procedure: BREAST RECONSTRUCTION WITH PLACEMENT OF TISSUE  EXPANDER AND FLEX HD (ACELLULAR HYDRATED DERMIS);  Surgeon: Wallace Going, DO;  Location: [facility] OR[facility];  Service: Plastics;  Laterality: Bilateral;   PORTACATH PLACEMENT Right 12/13/2020   Procedure: INSERTION PORT-A-CATH;  Surgeon: Herbert Pun, MD;  Location: [facility] OR[facility];  Service: General;  Laterality: Right;   REMOVAL OF BILATERAL TISSUE EXPANDERS WITH PLACEMENT OF BILATERAL BREAST IMPLANTS Bilateral 09/15/2021   Procedure: REMOVAL OF BILATERAL TISSUE EXPANDERS;  Surgeon: Wallace Going, DO;  Location: [place];  Service: Plastics;  Laterality: Bilateral;   Patient Active Problem List   Diagnosis Date Noted   Chemotherapy-induced neuropathy ([place]) 02/06/2022   Right sided weakness 02/06/2022   Transaminitis 11/20/2021   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 10/25/2021   Malignant neoplasm of left breast in female, estrogen receptor positive ([place]) 06/25/2021   Genetic testing 01/15/2021   Family history of cancer    Invasive carcinoma of breast ([place]) 12/03/2020   Breast mass, left 07/01/2020   Situational mixed anxiety and depressive disorder 07/01/2020   Anxiety state 08/06/2014   Varicose veins of lower extremity 08/06/2014    PCP: Juluis Pitch, MD   REFERRING PROVIDER: Sindy Guadeloupe, MD  REFERRING DIAG: 197.2  THERAPY DIAG: Post-mastectomy lymphedema syndrome  ONSET DATE: 9/22  Rationale  for Evaluation and Treatment: Rehabilitation  SUBJECTIVE                                                                                                                                                                                           SUBJECTIVE STATEMENT: Vickie Mathis presents to OT to address LUE/LUQ post-mastectomy lymphedema. Pt reports 2-4/10 pain in L lateral trunk adjacent to axilla in serratus region and at L chest wall surgical site. Pt's prophylactic compression sleeve arrived from vendor today. Pt reports she's considering  returning to work at the [employer] doing light duty. Pt reports she liked kinesiotape applied last session . It made it feel normal."  PERTINENT HISTORY: 2021 self palpated L breast mass. Diagnostic mammogram and ultrasound revealed 2.7 cm mass at 12 o'clock 6 cm from the nipple. L axillary ultrasound revealed 2 suspicious LN. Both breast mass and LN + for invasive mammary carcinoma grade 3, ER/PR and her 2 negative.R breast non-mass enhancement was negative for Br Ca.Pt underwent neoadjuvant chemotherapy. 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 She underwent bilateral mastectomy with reconstruction in 9/22.Pathology revealed complete pathological response with 2 LN negative and R breast negative for malignancy. Breast implants removed 2/2 staff infection. Anxiety, chemo-induced neuropathy, intermittent L chest wall pain,   IMPAIRMENTS: chronic pain L chest wall and lateral trunk s/p bilateral mastectomy and ALND, fatigue, altered sensation 2/2 chemo-induced neuropathy   FUNCTIONAL LIMITATIONS: pain at end ranges BUE shoulders, R>L; decreased hand function, limited participation in productive activities and work while recovering from cancer treatment.  OTHER OBSTACLES TO OT LE CARE: none identified at initial eval  PAIN:  Are you having pain? Yes NPRS scale: 4/10 Pain location: L lateral trunk and chest at mastectomy site PAIN TYPE: chronic Pain description: intermittent , sore, tender, tight numb Aggravating factors: lifting, carrying, pushing, pulling, reaching overhead Relieving factors: unknown   PRECAUTIONS: Other: lymphedema precautions   PATIENT GOALS: relieve post-mastectomy pain and limit LE progression   OBJECTIVE BLE COMPARATIVE LIMB VOLUMETRICS:   09/02/22 INITIAL LUE  LANDMARK LEFT  09/02/22  L MPs 20.5 cm  RUE (excluding hand)  2910.9 ml     Limb Volume differential (LVD)  %  Volume change since initial %  Volume change  overall %  (Blank rows = not tested) :    PATIENT SURVEYS:  FOTO functional outcome measure: Intake TBA OT Rx visit 1   LYMPHEDEMA LIFE IMPACT SCALE: (The extent to which lymphedema-related problems affected your life last week)  Intake score: 20.59%    TODAY'S TREATMENT:  Pt edu for simple self MLD and sleeve wear and care, donning and doffing Fitting for prophylactic compression armsleeve -L LUE/LUQ MLD as established with simultaneous skin care Kinesio Tape to L lateral trunk and surgical scars  PATIENT EDUCATION: Continued skilled Pt/caregiver education  And LE ADL training throughout visit for lymphedema self care/ home program, including compression wrapping, kinesiotape, compression garment and device wear/care, lymphatic pumping ther ex, simple self-MLD, and skin care. Discussed initial volumetric measurements. Discussed all long term goals.   Person educated: Patient Education method: Handouts, demonstration Education comprehension: verbalized understanding, returned demonstration, and needs further education  HOME EXERCISE PROGRAM: 1.Therapeutic lymphatic pumping therex- 2 sets of 10 reps, hold 5 seconds; elements in order. 2 x daily PRN 2.  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 Belisse compression bra ( 39 a/b) and a Jovipak double mastectomy pad for HOS to reduce fibrosis and facilitate imprved lymphatic function to limit progression. 3. Daily skin care to sustain optimal hydration. Wash bites, scratches, blisters, etc with mild soap and water , apply antibacterial first aide cream and a band aide. 4. Simple self-Manual lymphatic drainage (MLD) PRN.  ASSESSMENT:  CLINICAL IMPRESSION: Pt tolerated MLD to LUQ in  supine and side lying without increased pain. Pt able to perform stationery and dynamic J strokes after skilled teaching. Pt able to don L compression armsleeve with min A and extra time. `Garment fits perfectly. Cleaned skin and applied two, 2" wide, 3-finger fan   cuts  - anchoring ot contralateral spine and extending fingers across lateral trunk, mastectomy scars and ending at sternum. Pt verbalized understanding of how to remove tape to limit injury to skin. Productive session.Cont as per POC.  OBJECTIVE IMPAIRMENTS: decreased knowledge of lymphedema ;  decreased knowledge of use of DME, decreased ROM, increased edema, impaired sensation, and pain with onset in   ACTIVITY LIMITATIONS: carrying, lifting, bed mobility, bathing, dressing, reach over head, and hygiene/grooming  PARTICIPATION LIMITATIONS: occupation  PERSONAL FACTORS:  Hx comorbid anxiety  is also affecting patient's functional outcome.   REHAB POTENTIAL: Good  CLINICAL DECISION MAKING: Stable/uncomplicated   GOALS: Goals reviewed with patient? Yes  LONG TERM GOALS: Target date: 11/22/21   Given this patient's Intake score of 20.59/100% on the Lymphedema Life Impact Scale (LLIS), patient will experience a reduction of at least 5% in her perceived level of functional impairment resulting from lymphedema to improve functional performance and quality of life (QOL). Baseline: 20.59%  Goal status: INITIAL  2.  Given this patient's Intake score of TBA/100% on the functional outcomes FOTO tool, patient will experience an increase in function of 5 points  to improve basic and instrumental ADLs performance, including lymphedema self-care. Baseline:  TBA Goal status: INITIAL  3.   Pt will demonstrate understanding of lymphedema precautions and prevention strategies with modified independence using a printed reference to identify at least 5 precautions and discussing how s/he may implement them into daily life to reduce risk of  progression and to limit infection risk. Baseline: Max A Goal status: INITIAL  4.  Pt will be able to don and doff prophylactic compression arm sleeve using assistive devices and extra time (modified independent), and discuss wear and garment care recommendations  to limit lymphedema by issue date. Baseline: dependent Goal status: 09/15/22 GOAL MET  5.  Pt will demonstrate the ability to perform all lymphedema self-care  home program components with printed reference sheets (modified independence) by DC to limit lymphedema progression Baseline: dependent Goal status: INITIAL  6.  Pt will report 0/10 shoulder pain with AROM in all planes at end ranges bilaterally against gravity by DC for optimal functional performance of basic and instrumental ADLs, work and productive activities, leisure pursuits and social participation, Baseline: 2/10 Goal status: INITIAL  PLAN:  PT FREQUENCY: 1-2x/week  PT DURATION: 8 weeks  PLANNED INTERVENTIONS: Therapeutic exercises, Therapeutic activity, Neuromuscular re-education, Balance training, Gait training, Patient/Family education, Self Care, and Joint mobilization  PLAN FOR NEXT SESSION:  Cont LUE/LUQ MLD , scar massage and myofascial release. Cont L trunk and chest wall Kinesiotape as tolerated Continue teaching simple self-MLD Cont Pt edu for LE self care  Andrey Spearman, MS, OTR/L, CLT-LANA 09/15/22 12:59 PM

## 2022-09-18 ENCOUNTER — Encounter: Payer: Commercial Managed Care - HMO | Admitting: Occupational Therapy

## 2022-09-20 ENCOUNTER — Other Ambulatory Visit: Payer: Self-pay | Admitting: *Deleted

## 2022-09-20 ENCOUNTER — Other Ambulatory Visit: Payer: Self-pay

## 2022-09-20 NOTE — Progress Notes (Unsigned)
To Whom it may concern  :   Vickie Mathis was seen with Dr Janese Banks on 08/24/2022. Vickie Mathis is still on her Mycophenolate which has been  increased 1250 mg twice a day  She also has been put on Dapsone daily per Dr. Merrilee Jansky at [facility] who is her liver doctor. Her recent lab showed that the liver enzymes was normal. She is still will follow up with by Dr. Merrilee Jansky for her transaminitis.  Dr. Merrilee Jansky and his team has sent Dr. Janese Banks a fax that he would like to continue the every 2 weeks for labs and then send them to his office to determine if she needs changes with Mycophenolate Vickie Mathis reports that she continues to have fatigue every day.Vickie Mathis is still having pain from neuropathy and causing pain in arms and hands. Patient still having lymphedema and getting a referral to go back to physical/ occupational treatment. She has an appointment for the occupation therapy on September 02, 2022. The patient is unable to go back to work at this time due to the issues stated above.   Please contact our office if you have any questions or comments at [phone].   Sincerely,       Archana Rao,MD

## 2022-09-20 NOTE — Progress Notes (Unsigned)
To Whom it may concern  :   Vickie Mathis was seen with Dr Janese Banks on 08/24/2022. Vickie Mathis is still on her Mycophenolate which has been  increased 1250 mg twice a day  She also has been put on Dapsone daily per Dr. Merrilee Jansky at [facility] who is her liver doctor. Her recent lab showed that the liver enzymes was normal. She is still will follow up with by Dr. Merrilee Jansky for her transaminitis.  Dr. Merrilee Jansky and his team has sent Dr. Janese Banks a fax that he would like to continue the every 2 weeks for labs and then send them to his office to determine if she needs changes with Mycophenolate Desire reports that she continues to have fatigue every day.Vickie Mathis is still having pain from neuropathy and causing pain in arms and hands. Patient still having lymphedema and getting a referral to go back to physical/ occupational treatment. She has an appointment for the occupation therapy on September 02, 2022. The patient is unable to go back to work at this time due to the issues stated above.   Please contact our office if you have any questions or comments at [phone].   Sincerely,       Vickie Rao,MD

## 2022-09-21 ENCOUNTER — Encounter: Payer: Self-pay | Admitting: *Deleted

## 2022-09-22 ENCOUNTER — Other Ambulatory Visit: Payer: Self-pay | Admitting: Pharmacist

## 2022-09-22 ENCOUNTER — Ambulatory Visit: Payer: Commercial Managed Care - HMO | Admitting: Occupational Therapy

## 2022-09-22 DIAGNOSIS — I972 Postmastectomy lymphedema syndrome: Secondary | ICD-10-CM

## 2022-09-22 NOTE — Therapy (Signed)
OUTPATIENT OCCUPATIONAL THERAPY  TREATMENT NOTE [place] POST-MASTECTOMY LYMPHEDEMA  Patient Name: Vickie Mathis MRN: [id number] DOB:[birth year], 31 y.o., female Today's Date: 09/22/2022  END OF SESSION:  OT End of Session - 09/22/22 1526     Visit Number 7    Number of Visits 36   Date for OT Re-Evaluation 11/22/22    OT Start Time 0200    OT Stop Time 0310    OT Time Calculation (min) 70 min    Activity Tolerance Patient tolerated treatment well;No increased pain    Behavior During Therapy WFL for tasks assessed/performed                 Past Medical History:  Diagnosis Date   Anxiety    Asthma    Breast cancer ([place]) 11/2020   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 AXILLARY NODE DISSECTION;  Surgeon: Herbert Pun, MD;  Location: [facility];  Service: General;  Laterality: Bilateral;  [place], 1.5 hours Cintron-Diaz 2.5 hours   BREAST BIOPSY Left 11/26/2020   vision 12:00 6cmfn [facility]   BREAST BIOPSY Left 11/26/2020   LN bx, hydro marker,  fragments of macrometastatic carcinoma   BREAST RECONSTRUCTION WITH PLACEMENT OF TISSUE EXPANDER AND FLEX HD (ACELLULAR HYDRATED DERMIS) Bilateral 06/25/2021   Procedure: BREAST RECONSTRUCTION WITH PLACEMENT OF TISSUE EXPANDER AND FLEX HD (ACELLULAR HYDRATED DERMIS);  Surgeon: Wallace Going, DO;  Location: [facility];  Service: Plastics;  Laterality: Bilateral;   PORTACATH PLACEMENT Right 12/13/2020   Procedure: INSERTION PORT-A-CATH;  Surgeon: Herbert Pun, MD;  Location: [facility];  Service: General;  Laterality: Right;   REMOVAL OF BILATERAL TISSUE  EXPANDERS WITH PLACEMENT OF BILATERAL BREAST IMPLANTS Bilateral 09/15/2021   Procedure: REMOVAL OF BILATERAL TISSUE EXPANDERS;  Surgeon: Wallace Going, DO;  Location: [place];  Service: Plastics;  Laterality: Bilateral;   Patient Active Problem List   Diagnosis Date Noted   Chemotherapy-induced neuropathy ([place]) 02/06/2022   Right sided weakness 02/06/2022   Transaminitis 11/20/2021   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 10/25/2021   Malignant neoplasm of left breast in female, estrogen receptor positive ([place]) 06/25/2021   Genetic testing 01/15/2021   Family history of cancer    Invasive carcinoma of breast ([place]) 12/03/2020   Breast mass, left 07/01/2020   Situational mixed anxiety and depressive disorder 07/01/2020   Anxiety state 08/06/2014   Varicose veins of lower extremity 08/06/2014    PCP: Juluis Pitch, MD   REFERRING PROVIDER: Sindy Guadeloupe, MD  REFERRING DIAG: 197.2  THERAPY DIAG: Postmastectomy lymphedema syndrome  ONSET DATE: 9/22  Rationale for Evaluation and Treatment: Rehabilitation  SUBJECTIVE  SUBJECTIVE STATEMENT: Keven Soucy presents to OT to address LUE/LUQ post-mastectomy lymphedema. Pt reports 2-4/10 pain in L lateral trunk adjacent to axilla in serratus region and at L chest wall surgical site. Pt arrives wearing prophylactic compression sleeve issued last session. Pt states she is wearing it because she has been workiing hard physically this afternoon. Pt is accompanied by her husband, [name], who is here to learn how to apply Kinesiotape and how to perform simple self MLD.  PERTINENT HISTORY: 2021 self palpated L breast mass. Diagnostic mammogram and ultrasound revealed 2.7 cm mass at 12 o'clock 6 cm from the nipple. L axillary ultrasound  revealed 2 suspicious LN. Both breast mass and LN + for invasive mammary carcinoma grade 3, ER/PR and her 2 negative.R breast non-mass enhancement was negative for Br Ca.Pt underwent neoadjuvant chemotherapy. 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 She underwent bilateral mastectomy with reconstruction in 9/22.Pathology revealed complete pathological response with 2 LN negative and R breast negative for malignancy. Breast implants removed 2/2 staff infection. Anxiety, chemo-induced neuropathy, intermittent L chest wall pain,   IMPAIRMENTS: chronic pain L chest wall and lateral trunk s/p bilateral mastectomy and ALND, fatigue, altered sensation 2/2 chemo-induced neuropathy   FUNCTIONAL LIMITATIONS: pain at end ranges BUE shoulders, R>L; decreased hand function, limited participation in productive activities and work while recovering from cancer treatment.  OTHER OBSTACLES TO OT LE CARE: none identified at initial eval  PAIN:  Are you having pain? Yes NPRS scale: 4/10 Pain location: L lateral trunk and chest at mastectomy site PAIN TYPE: chronic Pain description: intermittent , sore, tender, tight numb Aggravating factors: lifting, carrying, pushing, pulling, reaching overhead Relieving factors: unknown   PRECAUTIONS: Other: lymphedema precautions   PATIENT GOALS: relieve post-mastectomy pain and limit LE progression   OBJECTIVE BLE COMPARATIVE LIMB VOLUMETRICS:   09/02/22 INITIAL LUE  LANDMARK LEFT  09/02/22  L MPs 20.5 cm  RUE (excluding hand)  2910.9 ml     Limb Volume differential (LVD)  %  Volume change since initial %  Volume change overall %  (Blank rows = not tested) :    PATIENT SURVEYS:  FOTO functional outcome measure: Intake TBA OT Rx visit 1   LYMPHEDEMA LIFE IMPACT SCALE: (The extent to which lymphedema-related problems affected your life last week)  Intake score: 20.59%    TODAY'S TREATMENT:                                                                                                                                         Pt and family edu for simple self-MLD to [place]. Handout given. LUE/LUQ MLD as established with simultaneous skin care Kinesio Tape to L lateral trunk and surgical scars  PATIENT EDUCATION: Emphasis of skilled LE self-care training today on  simple self-MLD. Spouse mastered J stroke ans short neck sequence. Pt performed handwashing sequence  and axillary-inguinal sequence with Pt in side lying with verbal and physical cues for J stroke technique, directionality and timing.  Technique will improve with practice.  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 2 x daily PRN 2.  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 Belisse compression bra ( 39 a/b) and a Jovipak double mastectomy pad for HOS to reduce fibrosis and facilitate imprved lymphatic function to limit progression. 3. Daily skin care to sustain optimal hydration. Wash bites, scratches, blisters, etc with mild soap and water , apply antibacterial first aide cream and a band aide. 4. Simple self-Manual lymphatic drainage (MLD) PRN.  ASSESSMENT:  CLINICAL IMPRESSION: Pt tolerated MLD to LUQ in supine and side lying without increased pain. Pt's spouse able to perform short neck sequence, hand washing, and axillary-inguinal anastomosis sequence in side lying with max assistance after skilled teaching ( verbal and tactile cues) . Handout given. Also taught spouse to apply kinesiotape strips from contralateral spine around lateral thorax and across  chest wall, including mastectomy scars, Good returns. Spouse will practice all techniques and return for further instruction PRN. Cont as per POC.  OBJECTIVE IMPAIRMENTS: decreased knowledge of lymphedema ;  decreased knowledge of use of DME, decreased ROM, increased edema, impaired sensation, and pain with onset in   ACTIVITY LIMITATIONS: carrying, lifting, bed mobility, bathing, dressing, reach over head, and hygiene/grooming  PARTICIPATION LIMITATIONS: occupation  PERSONAL FACTORS:  Hx comorbid anxiety  is also affecting patient's functional outcome.   REHAB POTENTIAL: Good  CLINICAL DECISION MAKING: Stable/uncomplicated   GOALS: Goals reviewed with patient? Yes  LONG TERM GOALS: Target date: 11/22/21   Given this patient's Intake score of 20.59/100% on the Lymphedema Life Impact Scale (LLIS), patient will experience a reduction of at least 5% in her perceived level of functional impairment resulting from lymphedema to improve functional performance and quality of life (QOL). Baseline: 20.59%  Goal status: INITIAL  2.  Given this patient's Intake score of TBA/100% on the functional outcomes FOTO tool, patient will experience an increase in function of 5 points  to improve basic and instrumental ADLs performance, including lymphedema self-care. Baseline:  TBA Goal status: INITIAL  3.   Pt will demonstrate understanding of lymphedema precautions and prevention strategies with modified independence using a printed reference to identify at least 5 precautions and discussing how s/he may implement them into daily life to reduce risk of progression and to limit infection risk. Baseline: Max A Goal status: INITIAL  4.  Pt will be able to don and doff prophylactic compression arm sleeve using assistive devices and extra time (modified independent), and discuss wear and garment care recommendations  to limit lymphedema by issue date. Baseline: dependent Goal status: 09/15/22 GOAL  MET  5.  Pt will demonstrate the ability to perform all lymphedema self-care home program components with printed reference sheets (modified independence) by DC to limit lymphedema progression Baseline: dependent Goal status: INITIAL  6.  Pt will report 0/10 shoulder pain with AROM in all planes at end ranges bilaterally against gravity by DC for optimal functional performance of basic and instrumental ADLs, work and productive activities, leisure pursuits and social participation, Baseline: 2/10 Goal status: INITIAL  PLAN:  PT FREQUENCY: 1-2x/week  PT DURATION: 12 weeks and PRN  PLANNED INTERVENTIONS: Therapeutic exercises, Therapeutic activity, Neuromuscular re-education, Balance training, Gait training, Patient/Family education, Self Care, and Joint mobilization  PLAN FOR NEXT SESSION:  Cont LUE/LUQ MLD , scar massage and myofascial release. Cont L trunk and chest wall Kinesiotape as tolerated Continue teaching LE self care  Andrey Spearman, MS, OTR/L, CLT-LANA 09/22/22 3:28 PM

## 2022-09-23 ENCOUNTER — Ambulatory Visit: Payer: Commercial Managed Care - HMO | Admitting: Occupational Therapy

## 2022-09-23 DIAGNOSIS — I972 Postmastectomy lymphedema syndrome: Secondary | ICD-10-CM

## 2022-09-24 ENCOUNTER — Encounter: Payer: Commercial Managed Care - HMO | Admitting: Occupational Therapy

## 2022-09-24 ENCOUNTER — Encounter: Payer: Self-pay | Admitting: Occupational Therapy

## 2022-09-24 NOTE — Therapy (Signed)
OUTPATIENT OCCUPATIONAL THERAPY  TREATMENT NOTE [place] POST-MASTECTOMY LYMPHEDEMA  Patient Name: Vickie Mathis MRN: [id number] DOB:[birth year], 31 y.o., female Today's Date: 09/24/2022  END OF SESSION:  OT End of Session - 09/24/22 1123     Visit Number 8    Number of Visits 36    Date for OT Re-Evaluation 11/22/22    OT Start Time 0210    OT Stop Time 0310    OT Time Calculation (min) 60 min    Activity Tolerance Patient tolerated treatment well;No increased pain    Behavior During Therapy WFL for tasks assessed/performed                   Past Medical History:  Diagnosis Date   Anxiety    Asthma    Breast cancer ([place]) 11/2020   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 AXILLARY NODE DISSECTION;  Surgeon: Herbert Pun, MD;  Location: [facility];  Service: General;  Laterality: Bilateral;  [place], 1.5 hours Cintron-Diaz 2.5 hours   BREAST BIOPSY Left 11/26/2020   vision 12:00 6cmfn [facility]   BREAST BIOPSY Left 11/26/2020   LN bx, hydro marker,  fragments of macrometastatic carcinoma   BREAST RECONSTRUCTION WITH PLACEMENT OF TISSUE EXPANDER AND FLEX HD (ACELLULAR HYDRATED DERMIS) Bilateral 06/25/2021   Procedure: BREAST RECONSTRUCTION WITH PLACEMENT OF TISSUE EXPANDER AND FLEX HD (ACELLULAR HYDRATED DERMIS);  Surgeon: Wallace Going, DO;  Location: [facility];  Service: Plastics;  Laterality: Bilateral;   PORTACATH PLACEMENT Right 12/13/2020   Procedure: INSERTION PORT-A-CATH;  Surgeon: Herbert Pun, MD;  Location: [facility];  Service: General;  Laterality: Right;   REMOVAL OF BILATERAL TISSUE  EXPANDERS WITH PLACEMENT OF BILATERAL BREAST IMPLANTS Bilateral 09/15/2021   Procedure: REMOVAL OF BILATERAL TISSUE EXPANDERS;  Surgeon: Wallace Going, DO;  Location: [place];  Service: Plastics;  Laterality: Bilateral;   Patient Active Problem List   Diagnosis Date Noted   Chemotherapy-induced neuropathy ([place]) 02/06/2022   Right sided weakness 02/06/2022   Transaminitis 11/20/2021   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 10/25/2021   Malignant neoplasm of left breast in female, estrogen receptor positive ([place]) 06/25/2021   Genetic testing 01/15/2021   Family history of cancer    Invasive carcinoma of breast ([place]) 12/03/2020   Breast mass, left 07/01/2020   Situational mixed anxiety and depressive disorder 07/01/2020   Anxiety state 08/06/2014   Varicose veins of lower extremity 08/06/2014    PCP: Juluis Pitch, MD   REFERRING PROVIDER: Sindy Guadeloupe, MD  REFERRING DIAG: 197.2  THERAPY DIAG: Post-mastectomy lymphedema syndrome  ONSET DATE: 9/22  Rationale for Evaluation and Treatment: Rehabilitation  SUBJECTIVE  SUBJECTIVE STATEMENT: Vickie Mathis presents to OT to address LUE/LUQ post-mastectomy lymphedema. Pt reports 2-4/10 pain in L lateral trunk adjacent to axilla in serratus region and at L chest wall surgical site. Pt arrives wearing prophylactic compression sleeve issued last session. Pt has no new concerns today. She feels kinesiotape has helped reduce sensory symptoms.  PERTINENT HISTORY: 2021 self palpated L breast mass. Diagnostic mammogram and ultrasound revealed 2.7 cm mass at 12 o'clock 6 cm from the nipple. L axillary ultrasound revealed 2 suspicious LN. Both breast mass and LN + for invasive mammary carcinoma grade 3, ER/PR and her 2 negative.R breast  non-mass enhancement was negative for Br Ca.Pt underwent neoadjuvant chemotherapy. 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 She underwent bilateral mastectomy with reconstruction in 9/22.Pathology revealed complete pathological response with 2 LN negative and R breast negative for malignancy. Breast implants removed 2/2 staff infection. Anxiety, chemo-induced neuropathy, intermittent L chest wall pain,   IMPAIRMENTS: chronic pain L chest wall and lateral trunk s/p bilateral mastectomy and ALND, fatigue, altered sensation 2/2 chemo-induced neuropathy   FUNCTIONAL LIMITATIONS: pain at end ranges BUE shoulders, R>L; decreased hand function, limited participation in productive activities and work while recovering from cancer treatment.  OTHER OBSTACLES TO OT LE CARE: none identified at initial eval  PAIN:  Are you having pain? Yes NPRS scale: 4/10 Pain location: L lateral trunk and chest at mastectomy site PAIN TYPE: chronic Pain description: intermittent , sore, tender, tight numb Aggravating factors: lifting, carrying, pushing, pulling, reaching overhead Relieving factors: unknown   PRECAUTIONS: Other: lymphedema precautions   PATIENT GOALS: relieve post-mastectomy pain and limit LE progression   OBJECTIVE BLE COMPARATIVE LIMB VOLUMETRICS:   09/02/22 INITIAL LUE  LANDMARK LEFT  09/02/22  L MPs 20.5 cm  RUE (excluding hand)  2910.9 ml     Limb Volume differential (LVD)  %  Volume change since initial %  Volume change overall %  (Blank rows = not tested) :    PATIENT SURVEYS:  FOTO functional outcome measure: Intake TBA OT Rx visit 1   LYMPHEDEMA LIFE IMPACT SCALE: (The extent to which lymphedema-related problems affected your life last week)  Intake score: 20.59%    TODAY'S TREATMENT:                                                                                                                                        Pt and  family edu for simple self-MLD to [place]. Handout given. LUE/LUQ MLD as established with simultaneous skin care Kinesio Tape to L lateral trunk and surgical scars  PATIENT EDUCATION: Continued Pt/ CG edu for lymphedema self care home program throughout session. Topics include outcome of comparative limb volumetrics- starting limb volume differentials (LVDs), technology and gradient techniques used for short stretch, multilayer compression wrapping, simple self-MLD, therapeutic lymphatic pumping exercises, skin/nail care, LE precautions,. compression garment  recommendations and specifications, wear and care schedule and compression garment donning / doffing w assistive devices. Discussed progress towards all OT goals since commencing CDT. All questions answered to the Pt's satisfaction. Good return.  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 2 x daily PRN 2.  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 Belisse compression bra ( 39 a/b) and a Jovipak double mastectomy pad for HOS to reduce fibrosis and facilitate imprved lymphatic function to limit progression. 3. Daily skin care to sustain optimal hydration. Wash bites, scratches, blisters, etc with mild soap and water , apply antibacterial first aide cream and a band aide. 4. Simple self-Manual lymphatic drainage (MLD) PRN.  ASSESSMENT:  CLINICAL IMPRESSION:  Pt reporting some reduction in lateral trunk   discomfort with consistent kinesiotaping. Pt consistently using prophylactic compression arm sleeve as directed., Good compliance. Pt tolerated MLD to LUE/LUQ  without increased pain. No need for additional kinisio tape today as tape applied earlier in the week remains in place. Cont as per POC.  OBJECTIVE IMPAIRMENTS: decreased knowledge of lymphedema ;  decreased knowledge of use of DME, decreased ROM, increased edema, impaired sensation, and pain with onset in   ACTIVITY LIMITATIONS: carrying, lifting, bed mobility, bathing, dressing, reach over head, and hygiene/grooming  PARTICIPATION LIMITATIONS: occupation  PERSONAL FACTORS:  Hx comorbid anxiety  is also affecting patient's functional outcome.   REHAB POTENTIAL: Good  CLINICAL DECISION MAKING: Stable/uncomplicated   GOALS: Goals reviewed with patient? Yes  LONG TERM GOALS: Target date: 11/22/21   Given this patient's Intake score of 20.59/100% on the Lymphedema Life Impact Scale (LLIS), patient will experience a reduction of at least 5% in her perceived level of functional impairment resulting from lymphedema to improve functional performance and quality of life (QOL). Baseline: 20.59%  Goal status: INITIAL  2.  Given this patient's Intake score of TBA/100% on the functional outcomes FOTO tool, patient will experience an increase in function of 5 points  to improve basic and instrumental ADLs performance, including lymphedema self-care. Baseline:  TBA Goal status: INITIAL  3.   Pt will demonstrate understanding of lymphedema precautions and prevention strategies with modified independence using a printed reference to identify at least 5 precautions and discussing how s/he may implement them into daily life to reduce risk of progression and to limit infection risk. Baseline: Max A Goal status: INITIAL  4.  Pt will be able to don and doff prophylactic compression arm sleeve using assistive devices and extra time (modified independent), and discuss wear and garment care recommendations  to limit lymphedema by issue date. Baseline: dependent Goal status: 09/15/22 GOAL MET  5.  Pt will  demonstrate the ability to perform all lymphedema self-care home program components with printed reference sheets (modified independence) by DC to limit lymphedema progression Baseline: dependent Goal status: INITIAL  6.  Pt will report 0/10 shoulder pain with AROM in all planes at end ranges bilaterally against gravity by DC for optimal functional performance of basic and instrumental ADLs, work and productive activities, leisure pursuits and social participation, Baseline: 2/10 Goal status: INITIAL  PLAN:  PT FREQUENCY: 1-2x/week  PT DURATION: 12 weeks and PRN  PLANNED INTERVENTIONS: Therapeutic exercises, Therapeutic  activity, Neuromuscular re-education, Balance training, Gait training, Patient/Family education, Self Care, and Joint mobilization  PLAN FOR NEXT SESSION:  Cont LUE/LUQ MLD , scar massage and myofascial release. Cont L trunk and chest wall Kinesiotape as tolerated Continue teaching LE self care  Andrey Spearman, MS, OTR/L, CLT-LANA 09/24/22 11:24 AM

## 2022-09-24 NOTE — Therapy (Deleted)
OUTPATIENT OCCUPATIONAL THERAPY  TREATMENT NOTE [place] POST-MASTECTOMY LYMPHEDEMA  Patient Name: Vickie Mathis MRN: [id number] DOB:1991-07-02, 31 y.o., female Today's Date: 09/24/2022  END OF SESSION:  OT End of Session - 09/22/22 1526     Visit Number 7    Number of Visits 36   Date for OT Re-Evaluation 11/22/22    OT Start Time 0200    OT Stop Time 0310    OT Time Calculation (min) 70 min    Activity Tolerance Patient tolerated treatment well;No increased pain    Behavior During Therapy WFL for tasks assessed/performed                 Past Medical History:  Diagnosis Date   Anxiety    Asthma    Breast cancer ([place]) 11/2020   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 AXILLARY NODE DISSECTION;  Surgeon: Herbert Pun, MD;  Location: [facility];  Service: General;  Laterality: Bilateral;  [place], 1.5 hours Cintron-Diaz 2.5 hours   BREAST BIOPSY Left 11/26/2020   vision 12:00 6cmfn [facility]   BREAST BIOPSY Left 11/26/2020   LN bx, hydro marker,  fragments of macrometastatic carcinoma   BREAST RECONSTRUCTION WITH PLACEMENT OF TISSUE EXPANDER AND FLEX HD (ACELLULAR HYDRATED DERMIS) Bilateral 06/25/2021   Procedure: BREAST RECONSTRUCTION WITH PLACEMENT OF TISSUE EXPANDER AND FLEX HD (ACELLULAR HYDRATED DERMIS);  Surgeon: Wallace Going, DO;  Location: [facility];  Service: Plastics;  Laterality: Bilateral;   PORTACATH PLACEMENT Right 12/13/2020   Procedure: INSERTION PORT-A-CATH;  Surgeon: Herbert Pun, MD;  Location: [facility];  Service: General;  Laterality: Right;   REMOVAL OF BILATERAL TISSUE  EXPANDERS WITH PLACEMENT OF BILATERAL BREAST IMPLANTS Bilateral 09/15/2021   Procedure: REMOVAL OF BILATERAL TISSUE EXPANDERS;  Surgeon: Wallace Going, DO;  Location: [facility];  Service: Plastics;  Laterality: Bilateral;   Patient Active Problem List   Diagnosis Date Noted   Chemotherapy-induced neuropathy ([place]) 02/06/2022   Right sided weakness 02/06/2022   Transaminitis 11/20/2021   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 10/25/2021   Malignant neoplasm of left breast in female, estrogen receptor positive ([place]) 06/25/2021   Genetic testing 01/15/2021   Family history of cancer    Invasive carcinoma of breast ([place]) 12/03/2020   Breast mass, left 07/01/2020   Situational mixed anxiety and depressive disorder 07/01/2020   Anxiety state 08/06/2014   Varicose veins of lower extremity 08/06/2014    PCP: Juluis Pitch, MD   REFERRING PROVIDER: Sindy Guadeloupe, MD  REFERRING DIAG: 197.2  THERAPY DIAG: Post-mastectomy lymphedema syndrome  ONSET DATE: 9/22  Rationale for Evaluation and Treatment: Rehabilitation  SUBJECTIVE  SUBJECTIVE STATEMENT: Vickie Mathis presents to OT to address LUE/LUQ post-mastectomy lymphedema. Pt reports 2-4/10 pain in L lateral trunk adjacent to axilla in serratus region and at L chest wall surgical site. Pt arrives wearing prophylactic compression sleeve issued last session. Pt states she is wearing it because she has been workiing hard physically this afternoon. Pt is accompanied by her husband, Vickie Mathis, who is here to learn how to apply Kinesiotape and how to perform simple self MLD.  PERTINENT HISTORY: 2021 self palpated L breast mass. Diagnostic mammogram and ultrasound revealed 2.7 cm mass at 12 o'clock 6 cm from the nipple. L axillary ultrasound  revealed 2 suspicious LN. Both breast mass and LN + for invasive mammary carcinoma grade 3, ER/PR and her 2 negative.R breast non-mass enhancement was negative for Br Ca.Pt underwent neoadjuvant chemotherapy. 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 She underwent bilateral mastectomy with reconstruction in 9/22.Pathology revealed complete pathological response with 2 LN negative and R breast negative for malignancy. Breast implants removed 2/2 staff infection. Anxiety, chemo-induced neuropathy, intermittent L chest wall pain,   IMPAIRMENTS: chronic pain L chest wall and lateral trunk s/p bilateral mastectomy and ALND, fatigue, altered sensation 2/2 chemo-induced neuropathy   FUNCTIONAL LIMITATIONS: pain at end ranges BUE shoulders, R>L; decreased hand function, limited participation in productive activities and work while recovering from cancer treatment.  OTHER OBSTACLES TO OT Mathis CARE: none identified at initial eval  PAIN:  Are you having pain? Yes NPRS scale: 4/10 Pain location: L lateral trunk and chest at mastectomy site PAIN TYPE: chronic Pain description: intermittent , sore, tender, tight numb Aggravating factors: lifting, carrying, pushing, pulling, reaching overhead Relieving factors: unknown   PRECAUTIONS: Other: lymphedema precautions   PATIENT GOALS: relieve post-mastectomy pain and limit Mathis progression   OBJECTIVE BLE COMPARATIVE LIMB VOLUMETRICS:   09/02/22 INITIAL LUE  LANDMARK LEFT  09/02/22  L MPs 20.5 cm  RUE (excluding hand)  2910.9 ml     Limb Volume differential (LVD)  %  Volume change since initial %  Volume change overall %  (Blank rows = not tested) :    PATIENT SURVEYS:  FOTO functional outcome measure: Intake TBA OT Rx visit 1   LYMPHEDEMA LIFE IMPACT SCALE: (The extent to which lymphedema-related problems affected your life last week)  Intake score: 20.59%    TODAY'S TREATMENT:                                                                                                                                         Pt and family edu for simple self-MLD to [place]. Handout given. LUE/LUQ MLD as established with simultaneous skin care Kinesio Tape to L lateral trunk and surgical scars  PATIENT EDUCATION: Emphasis of skilled Mathis self-care training today on  simple self-MLD. Spouse mastered J stroke ans short neck sequence. Pt performed handwashing sequence  and axillary-inguinal sequence with Pt in side lying with verbal and physical cues for J stroke technique, directionality and timing.  Technique will improve with practice.  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 2 x daily PRN 2.  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 Belisse compression bra ( 39 a/b) and a Jovipak double mastectomy pad for HOS to reduce fibrosis and facilitate imprved lymphatic function to limit progression. 3. Daily skin care to sustain optimal hydration. Wash bites, scratches, blisters, etc with mild soap and water , apply antibacterial first aide cream and a band aide. 4. Simple self-Manual lymphatic drainage (MLD) PRN.  ASSESSMENT:  CLINICAL IMPRESSION: Pt tolerated MLD to LUQ in supine and side lying without increased pain. Pt's spouse able to perform short neck sequence, hand washing, and axillary-inguinal anastomosis sequence in side lying with max assistance after skilled teaching ( verbal and tactile cues) . Handout given. Also taught spouse to apply kinesiotape strips from contralateral spine around lateral thorax and across  chest wall, including mastectomy scars, Good returns. Spouse will practice all techniques and return for further instruction PRN. Cont as per POC.  OBJECTIVE IMPAIRMENTS: decreased knowledge of lymphedema ;  decreased knowledge of use of DME, decreased ROM, increased edema, impaired sensation, and pain with onset in   ACTIVITY LIMITATIONS: carrying, lifting, bed mobility, bathing, dressing, reach over head, and hygiene/grooming  PARTICIPATION LIMITATIONS: occupation  PERSONAL FACTORS:  Hx comorbid anxiety  is also affecting patient's functional outcome.   REHAB POTENTIAL: Good  CLINICAL DECISION MAKING: Stable/uncomplicated   GOALS: Goals reviewed with patient? Yes  LONG TERM GOALS: Target date: 11/22/21   Given this patient's Intake score of 20.59/100% on the Lymphedema Life Impact Scale (LLIS), patient will experience a reduction of at least 5% in her perceived level of functional impairment resulting from lymphedema to improve functional performance and quality of life (QOL). Baseline: 20.59%  Goal status: INITIAL  2.  Given this patient's Intake score of TBA/100% on the functional outcomes FOTO tool, patient will experience an increase in function of 5 points  to improve basic and instrumental ADLs performance, including lymphedema self-care. Baseline:  TBA Goal status: INITIAL  3.   Pt will demonstrate understanding of lymphedema precautions and prevention strategies with modified independence using a printed reference to identify at least 5 precautions and discussing how s/he may implement them into daily life to reduce risk of progression and to limit infection risk. Baseline: Max A Goal status: INITIAL  4.  Pt will be able to don and doff prophylactic compression arm sleeve using assistive devices and extra time (modified independent), and discuss wear and garment care recommendations  to limit lymphedema by issue date. Baseline: dependent Goal status: 09/15/22 GOAL  MET  5.  Pt will demonstrate the ability to perform all lymphedema self-care home program components with printed reference sheets (modified independence) by DC to limit lymphedema progression Baseline: dependent Goal status: INITIAL  6.  Pt will report 0/10 shoulder pain with AROM in all planes at end ranges bilaterally against gravity by DC for optimal functional performance of basic and instrumental ADLs, work and productive activities, leisure pursuits and social participation, Baseline: 2/10 Goal status: INITIAL  PLAN:  PT FREQUENCY: 1-2x/week  PT DURATION: 12 weeks and PRN  PLANNED INTERVENTIONS: Therapeutic exercises, Therapeutic activity, Neuromuscular re-education, Balance training, Gait training, Patient/Family education, Self Care, and Joint mobilization  PLAN FOR NEXT SESSION:  Cont LUE/LUQ MLD , scar massage and myofascial release. Cont L trunk and chest wall Kinesiotape as tolerated Continue teaching Mathis self care  Andrey Spearman, MS, OTR/L, CLT-LANA 09/24/22 9:06 AM

## 2022-09-24 NOTE — Therapy (Signed)
OUTPATIENT OCCUPATIONAL THERAPY  TREATMENT NOTE [place] POST-MASTECTOMY LYMPHEDEMA  Patient Name: Vickie Mathis MRN: [id number] DOB:June 22, 1991, 31 y.o., female Today's Date: 09/24/2022  END OF SESSION:  OT End of Session - 09/24/22 0906     Number of Visits 36    Date for OT Re-Evaluation 11/22/22    Activity Tolerance Patient tolerated treatment well;No increased pain    Behavior During Therapy WFL for tasks assessed/performed               Past Medical History:  Diagnosis Date   Anxiety    Asthma    Breast cancer ([place]) 11/2020   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 AXILLARY NODE DISSECTION;  Surgeon: Herbert Pun, MD;  Location: [facility];  Service: General;  Laterality: Bilateral;  [place], 1.5 hours Cintron-Diaz 2.5 hours   BREAST BIOPSY Left 11/26/2020   vision 12:00 6cmfn [facility]   BREAST BIOPSY Left 11/26/2020   LN bx, hydro marker,  fragments of macrometastatic carcinoma   BREAST RECONSTRUCTION WITH PLACEMENT OF TISSUE EXPANDER AND FLEX HD (ACELLULAR HYDRATED DERMIS) Bilateral 06/25/2021   Procedure: BREAST RECONSTRUCTION WITH PLACEMENT OF TISSUE EXPANDER AND FLEX HD (ACELLULAR HYDRATED DERMIS);  Surgeon: Wallace Going, DO;  Location: [facility];  Service: Plastics;  Laterality: Bilateral;   PORTACATH PLACEMENT Right 12/13/2020   Procedure: INSERTION PORT-A-CATH;  Surgeon: Herbert Pun, MD;  Location: [facility];  Service: General;  Laterality: Right;   REMOVAL OF BILATERAL TISSUE EXPANDERS WITH PLACEMENT OF BILATERAL BREAST IMPLANTS Bilateral 09/15/2021   Procedure: REMOVAL OF BILATERAL  TISSUE EXPANDERS;  Surgeon: Wallace Going, DO;  Location: [place];  Service: Plastics;  Laterality: Bilateral;   Patient Active Problem List   Diagnosis Date Noted   Chemotherapy-induced neuropathy ([place]) 02/06/2022   Right sided weakness 02/06/2022   Transaminitis 11/20/2021   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 10/25/2021   Malignant neoplasm of left breast in female, estrogen receptor positive ([place]) 06/25/2021   Genetic testing 01/15/2021   Family history of cancer    Invasive carcinoma of breast ([place]) 12/03/2020   Breast mass, left 07/01/2020   Situational mixed anxiety and depressive disorder 07/01/2020   Anxiety state 08/06/2014   Varicose veins of lower extremity 08/06/2014    PCP: Juluis Pitch, MD   REFERRING PROVIDER: Sindy Guadeloupe, MD  REFERRING DIAG: 197.2  THERAPY DIAG: Post-mastectomy lymphedema syndrome  ONSET DATE: 9/22  Rationale for Evaluation and Treatment: Rehabilitation  SUBJECTIVE  SUBJECTIVE STATEMENT: Marijah Larranaga presents to OT to address LUE/LUQ post-mastectomy lymphedema. Pt reports 2-4/10 pain in L lateral trunk adjacent to axilla in serratus region and at L chest wall surgical site. Pt arrives wearing prophylactic compression sleeve issued last session. Pt states she is wearing it because she has been workiing hard physically this afternoon. Pt is accompanied by her husband, Vickie Mathis, who is here to learn how to apply Kinesiotape and how to perform simple self MLD.  PERTINENT HISTORY: 2021 self palpated L breast mass. Diagnostic mammogram and ultrasound revealed 2.7 cm mass at 12 o'clock 6 cm from the nipple. L axillary ultrasound revealed 2 suspicious LN. Both breast mass and LN + for invasive mammary carcinoma grade 3, ER/PR and her 2  negative.R breast non-mass enhancement was negative for Br Ca.Pt underwent neoadjuvant chemotherapy. 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 She underwent bilateral mastectomy with reconstruction in 9/22.Pathology revealed complete pathological response with 2 LN negative and R breast negative for malignancy. Breast implants removed 2/2 staff infection. Anxiety, chemo-induced neuropathy, intermittent L chest wall pain,   IMPAIRMENTS: chronic pain L chest wall and lateral trunk s/p bilateral mastectomy and ALND, fatigue, altered sensation 2/2 chemo-induced neuropathy   FUNCTIONAL LIMITATIONS: pain at end ranges BUE shoulders, R>L; decreased hand function, limited participation in productive activities and work while recovering from cancer treatment.  OTHER OBSTACLES TO OT Mathis CARE: none identified at initial eval  PAIN:  Are you having pain? Yes NPRS scale: 4/10 Pain location: L lateral trunk and chest at mastectomy site PAIN TYPE: chronic Pain description: intermittent , sore, tender, tight numb Aggravating factors: lifting, carrying, pushing, pulling, reaching overhead Relieving factors: unknown   PRECAUTIONS: Other: lymphedema precautions   PATIENT GOALS: relieve post-mastectomy pain and limit Mathis progression   OBJECTIVE BLE COMPARATIVE LIMB VOLUMETRICS:   09/02/22 INITIAL LUE  LANDMARK LEFT  09/02/22  L MPs 20.5 cm  RUE (excluding hand)  2910.9 ml     Limb Volume differential (LVD)  %  Volume change since initial %  Volume change overall %  (Blank rows = not tested) :    PATIENT SURVEYS:  FOTO functional outcome measure: Intake TBA OT Rx visit 1   LYMPHEDEMA LIFE IMPACT SCALE: (The extent to which lymphedema-related problems affected your life last week)  Intake score: 20.59%    TODAY'S TREATMENT:                                                                                                                                         Pt and family edu for simple self-MLD to [place]. Handout given. LUE/LUQ MLD as established with simultaneous skin care Kinesio Tape to L lateral trunk and surgical scars  PATIENT EDUCATION: Emphasis of skilled Mathis self-care training today on  simple self-MLD. Spouse mastered J stroke ans short neck sequence. Pt performed handwashing sequence  and axillary-inguinal sequence with Pt in side lying with verbal and physical cues for J stroke technique, directionality and timing.  Technique will improve with practice.  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 2 x daily PRN 2.  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 Belisse compression bra ( 39 a/b) and a Jovipak double mastectomy pad for HOS to reduce fibrosis and facilitate imprved lymphatic function to limit progression. 3. Daily skin care to sustain optimal hydration. Wash bites, scratches, blisters, etc with mild soap and water , apply antibacterial first aide cream and a band aide. 4. Simple self-Manual lymphatic drainage (MLD) PRN.  ASSESSMENT:  CLINICAL IMPRESSION: Pt tolerated MLD to LUQ in supine and side lying without increased pain. Pt's spouse able to perform short neck sequence, hand washing, and axillary-inguinal anastomosis sequence in side lying with max assistance after skilled teaching ( verbal and tactile cues) . Handout given. Also taught spouse to apply kinesiotape strips from contralateral spine around lateral thorax and across chest wall, including mastectomy scars, Good returns. Spouse will practice all techniques and return for  further instruction PRN. Cont as per POC.  OBJECTIVE IMPAIRMENTS: decreased knowledge of lymphedema ;  decreased knowledge of use of DME, decreased ROM, increased edema, impaired sensation, and pain with onset in   ACTIVITY LIMITATIONS: carrying, lifting, bed mobility, bathing, dressing, reach over head, and hygiene/grooming  PARTICIPATION LIMITATIONS: occupation  PERSONAL FACTORS:  Hx comorbid anxiety  is also affecting patient's functional outcome.   REHAB POTENTIAL: Good  CLINICAL DECISION MAKING: Stable/uncomplicated   GOALS: Goals reviewed with patient? Yes  LONG TERM GOALS: Target date: 11/22/21   Given this patient's Intake score of 20.59/100% on the Lymphedema Life Impact Scale (LLIS), patient will experience a reduction of at least 5% in her perceived level of functional impairment resulting from lymphedema to improve functional performance and quality of life (QOL). Baseline: 20.59%  Goal status: INITIAL  2.  Given this patient's Intake score of TBA/100% on the functional outcomes FOTO tool, patient will experience an increase in function of 5 points  to improve basic and instrumental ADLs performance, including lymphedema self-care. Baseline:  TBA Goal status: INITIAL  3.   Pt will demonstrate understanding of lymphedema precautions and prevention strategies with modified independence using a printed reference to identify at least 5 precautions and discussing how s/he may implement them into daily life to reduce risk of progression and to limit infection risk. Baseline: Max A Goal status: INITIAL  4.  Pt will be able to don and doff prophylactic compression arm sleeve using assistive devices and extra time (modified independent), and discuss wear and garment care recommendations  to limit lymphedema by issue date. Baseline: dependent Goal status: 09/15/22 GOAL MET  5.  Pt will demonstrate the ability to perform all lymphedema self-care home program components with  printed reference sheets (modified independence) by DC to limit lymphedema progression Baseline: dependent Goal status: INITIAL  6.  Pt will report 0/10 shoulder pain with AROM in all planes at end ranges bilaterally against gravity by DC for optimal functional performance of basic and instrumental ADLs, work and productive activities, leisure pursuits and social participation, Baseline: 2/10 Goal status: INITIAL  PLAN:  PT FREQUENCY: 1-2x/week  PT DURATION: 12 weeks and PRN  PLANNED INTERVENTIONS: Therapeutic exercises, Therapeutic activity, Neuromuscular re-education, Balance training, Gait training, Patient/Family education, Self Care, and Joint mobilization  PLAN FOR NEXT SESSION:  Cont LUE/LUQ MLD , scar massage and myofascial release. Cont L trunk and chest wall Kinesiotape as tolerated Continue teaching Mathis self care  Andrey Spearman, MS, OTR/L, CLT-LANA 09/24/22 9:14 AM

## 2022-09-29 ENCOUNTER — Other Ambulatory Visit: Payer: Self-pay | Admitting: Physician Assistant

## 2022-09-29 DIAGNOSIS — Z9889 Other specified postprocedural states: Secondary | ICD-10-CM

## 2022-10-02 ENCOUNTER — Inpatient Hospital Stay: Payer: Commercial Managed Care - HMO

## 2022-10-02 ENCOUNTER — Inpatient Hospital Stay: Payer: Commercial Managed Care - HMO | Attending: Oncology

## 2022-10-02 DIAGNOSIS — Z853 Personal history of malignant neoplasm of breast: Secondary | ICD-10-CM | POA: Insufficient documentation

## 2022-10-02 DIAGNOSIS — Z95828 Presence of other vascular implants and grafts: Secondary | ICD-10-CM

## 2022-10-02 DIAGNOSIS — Z171 Estrogen receptor negative status [ER-]: Secondary | ICD-10-CM

## 2022-10-02 DIAGNOSIS — R7401 Elevation of levels of liver transaminase levels: Secondary | ICD-10-CM

## 2022-10-02 LAB — COMPREHENSIVE METABOLIC PANEL
ALT: 54 U/L — ABNORMAL HIGH (ref 0–44)
AST: 43 U/L — ABNORMAL HIGH (ref 15–41)
Albumin: 4.4 g/dL (ref 3.5–5.0)
Alkaline Phosphatase: 114 U/L (ref 38–126)
Anion gap: 9 (ref 5–15)
BUN: 10 mg/dL (ref 6–20)
CO2: 21 mmol/L — ABNORMAL LOW (ref 22–32)
Calcium: 9.1 mg/dL (ref 8.9–10.3)
Chloride: 108 mmol/L (ref 98–111)
Creatinine, Ser: 0.59 mg/dL (ref 0.44–1.00)
GFR, Estimated: >60 mL/min (ref >60)
Glucose, Bld: 80 mg/dL (ref 70–99)
Potassium: 3.5 mmol/L (ref 3.5–5.1)
Sodium: 138 mmol/L (ref 135–145)
Total Bilirubin: 0.6 mg/dL (ref 0.3–1.2)
Total Protein: 7.4 g/dL (ref 6.5–8.1)

## 2022-10-02 LAB — CBC WITH DIFFERENTIAL/PLATELET
Abs Immature Granulocytes: 0.03 10*3/uL (ref 0.00–0.07)
Basophils Absolute: 0 10*3/uL (ref 0.0–0.1)
Basophils Relative: 1 %
Eosinophils Absolute: 0.1 10*3/uL (ref 0.0–0.5)
Eosinophils Relative: 2 %
HCT: 38.3 % (ref 36.0–46.0)
Hemoglobin: 13.3 g/dL (ref 12.0–15.0)
Immature Granulocytes: 1 %
Lymphocytes Relative: 18 %
Lymphs Abs: 1.2 10*3/uL (ref 0.7–4.0)
MCH: 30.4 pg (ref 26.0–34.0)
MCHC: 34.7 g/dL (ref 30.0–36.0)
MCV: 87.6 fL (ref 80.0–100.0)
Monocytes Absolute: 0.8 10*3/uL (ref 0.1–1.0)
Monocytes Relative: 13 %
Neutro Abs: 4.3 10*3/uL (ref 1.7–7.7)
Neutrophils Relative %: 65 %
Platelets: 169 10*3/uL (ref 150–400)
RBC: 4.37 MIL/uL (ref 3.87–5.11)
RDW: 18.7 % — ABNORMAL HIGH (ref 11.5–15.5)
WBC: 6.5 10*3/uL (ref 4.0–10.5)
nRBC: 0 % (ref 0.0–0.2)

## 2022-10-02 MED ORDER — HEPARIN SOD (PORK) LOCK FLUSH 100 UNIT/ML IV SOLN
500.0000 [IU] | Freq: Once | INTRAVENOUS | Status: AC
  Administered 2022-10-02: 500 [IU] via INTRAVENOUS
  Filled 2022-10-02: qty 5

## 2022-10-02 MED ORDER — SODIUM CHLORIDE 0.9% FLUSH
10.0000 mL | Freq: Once | INTRAVENOUS | Status: AC
  Administered 2022-10-02: 10 mL via INTRAVENOUS
  Filled 2022-10-02: qty 10

## 2022-10-07 ENCOUNTER — Ambulatory Visit: Payer: Commercial Managed Care - HMO | Attending: Oncology | Admitting: Occupational Therapy

## 2022-10-07 DIAGNOSIS — I972 Postmastectomy lymphedema syndrome: Secondary | ICD-10-CM | POA: Diagnosis not present

## 2022-10-08 NOTE — Therapy (Signed)
OUTPATIENT OCCUPATIONAL THERAPY  TREATMENT NOTE [place] POST-MASTECTOMY LYMPHEDEMA  Patient Name: Vickie Mathis MRN: [id number] DOB:1991/02/17, 32 y.o., female Today's Date: 10/08/2022  END OF SESSION:  OT End of Session - 10/07/22 1308     Visit Number 9    Number of Visits 36    Date for OT Re-Evaluation 11/22/22    OT Start Time 0110    OT Stop Time 0205    OT Time Calculation (min) 55 min    Activity Tolerance Patient tolerated treatment well;No increased pain    Behavior During Therapy WFL for tasks assessed/performed                   Past Medical History:  Diagnosis Date   Anxiety    Asthma    Breast cancer ([place]) 11/2020   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 AXILLARY NODE DISSECTION;  Surgeon: Herbert Pun, MD;  Location: [facility] OR[facility];  Service: General;  Laterality: Bilateral;  [place], 1.5 hours Cintron-Diaz 2.5 hours   BREAST BIOPSY Left 11/26/2020   vision 12:00 6cmfn [facility]   BREAST BIOPSY Left 11/26/2020   LN bx, hydro marker,  fragments of macrometastatic carcinoma   BREAST RECONSTRUCTION WITH PLACEMENT OF TISSUE EXPANDER AND FLEX HD (ACELLULAR HYDRATED DERMIS) Bilateral 06/25/2021   Procedure: BREAST RECONSTRUCTION WITH PLACEMENT OF TISSUE EXPANDER AND FLEX HD (ACELLULAR HYDRATED DERMIS);  Surgeon: Wallace Going, DO;  Location: [facility] OR[facility];  Service: Plastics;  Laterality: Bilateral;   PORTACATH PLACEMENT Right 12/13/2020   Procedure: INSERTION PORT-A-CATH;  Surgeon: Herbert Pun, MD;  Location: [facility];  Service: General;  Laterality: Right;   REMOVAL OF BILATERAL TISSUE  EXPANDERS WITH PLACEMENT OF BILATERAL BREAST IMPLANTS Bilateral 09/15/2021   Procedure: REMOVAL OF BILATERAL TISSUE EXPANDERS;  Surgeon: Wallace Going, DO;  Location: [facility];  Service: Plastics;  Laterality: Bilateral;   Patient Active Problem List   Diagnosis Date Noted   Chemotherapy-induced neuropathy ([place]) 02/06/2022   Right sided weakness 02/06/2022   Transaminitis 11/20/2021   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 10/25/2021   Malignant neoplasm of left breast in female, estrogen receptor positive ([place]) 06/25/2021   Genetic testing 01/15/2021   Family history of cancer    Invasive carcinoma of breast ([place]) 12/03/2020   Breast mass, left 07/01/2020   Situational mixed anxiety and depressive disorder 07/01/2020   Anxiety state 08/06/2014   Varicose veins of lower extremity 08/06/2014    PCP: Juluis Pitch, MD   REFERRING PROVIDER: Sindy Guadeloupe, MD  REFERRING DIAG: 197.2  THERAPY DIAG: Postmastectomy lymphedema syndrome  ONSET DATE: 9/22  Rationale for Evaluation and Treatment: Rehabilitation  SUBJECTIVE  SUBJECTIVE STATEMENT: [name] presents to OT to address LUE/LUQ post-mastectomy lymphedema. Pt reports 2-4/10 pain in L lateral trunk adjacent to axilla in serratus region and at L chest wall surgical site. Pt states she is tired today. She reports she has returned to light duty work at the [employer] 2 hours/day.  PERTINENT HISTORY: 2021 self palpated L breast mass. Diagnostic mammogram and ultrasound revealed 2.7 cm mass at 12 o'clock 6 cm from the nipple. L axillary ultrasound revealed 2 suspicious LN. Both breast mass and LN + for invasive mammary carcinoma grade 3, ER/PR and her 2 negative.R breast non-mass enhancement was negative for Br Ca.Pt  underwent neoadjuvant chemotherapy. 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 She underwent bilateral mastectomy with reconstruction in 9/22.Pathology revealed complete pathological response with 2 LN negative and R breast negative for malignancy. Breast implants removed 2/2 staff infection. Anxiety, chemo-induced neuropathy, intermittent L chest wall pain,   IMPAIRMENTS: chronic pain L chest wall and lateral trunk s/p bilateral mastectomy and ALND, fatigue, altered sensation 2/2 chemo-induced neuropathy   FUNCTIONAL LIMITATIONS: pain at end ranges BUE shoulders, R>L; decreased hand function, limited participation in productive activities and work while recovering from cancer treatment.  OTHER OBSTACLES TO OT LE CARE: none identified at initial eval  PAIN:  Are you having pain? Yes NPRS scale: 4/10 Pain location: L lateral trunk and chest at mastectomy site PAIN TYPE: chronic Pain description: intermittent , sore, tender, tight numb Aggravating factors: lifting, carrying, pushing, pulling, reaching overhead Relieving factors: unknown   PRECAUTIONS: Other: lymphedema precautions   PATIENT GOALS: relieve post-mastectomy pain and limit LE progression   OBJECTIVE BLE COMPARATIVE LIMB VOLUMETRICS:   09/02/22 INITIAL LUE  LANDMARK LEFT  09/02/22  L MPs 20.5 cm  RUE (excluding hand)  2910.9 ml     Limb Volume differential (LVD)  %  Volume change since initial %  Volume change overall %  (Blank rows = not tested) :    PATIENT SURVEYS:  FOTO functional outcome measure: Intake TBA OT Rx visit 1   LYMPHEDEMA LIFE IMPACT SCALE: (The extent to which lymphedema-related problems affected your life last week)  Intake score: 20.59%    TODAY'S TREATMENT:                                                                                                                                        Pt and family edu for LE self-care. LUE/LUQ MLD as  established with simultaneous skin care No kinesiotape 2/2 time constraints  PATIENT EDUCATION: Continued Pt/ CG edu for lymphedema self care home program throughout session. Topics include outcome of comparative limb volumetrics- starting limb volume differentials (LVDs), technology and gradient techniques used for short stretch, multilayer compression wrapping, simple self-MLD, therapeutic lymphatic pumping exercises, skin/nail care, LE precautions,. compression garment recommendations and specifications, wear and care schedule and compression garment  donning / doffing w assistive devices. Discussed progress towards all OT goals since commencing CDT. All questions answered to the Pt's satisfaction. Good return.  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 2 x daily PRN 2.  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 Belisse compression bra ( 39 a/b) and a Jovipak double mastectomy pad for HOS to reduce fibrosis and facilitate imprved lymphatic function to limit progression. 3. Daily skin care to sustain optimal hydration. Wash bites, scratches, blisters, etc with mild soap and water , apply antibacterial first aide cream and a band aide. 4. Simple self-Manual lymphatic drainage (MLD) PRN.  ASSESSMENT:  CLINICAL IMPRESSION:   Pt tolerated MLD to LUE/LUQ without increased pain. Emphasis of manual therapy on lateral trunk along ribs and adjacent to axilla. No time to apply kinesio tape today due to time constraints. Cont as per POC.  OBJECTIVE IMPAIRMENTS: decreased knowledge of lymphedema ;  decreased knowledge of  use of DME, decreased ROM, increased edema, impaired sensation, and pain with onset in   ACTIVITY LIMITATIONS: carrying, lifting, bed mobility, bathing, dressing, reach over head, and hygiene/grooming  PARTICIPATION LIMITATIONS: occupation  PERSONAL FACTORS:  Hx comorbid anxiety  is also affecting patient's functional outcome.   REHAB POTENTIAL: Good  CLINICAL DECISION MAKING: Stable/uncomplicated   GOALS: Goals reviewed with patient? Yes  LONG TERM GOALS: Target date: 11/22/21   Given this patient's Intake score of 20.59/100% on the Lymphedema Life Impact Scale (LLIS), patient will experience a reduction of at least 5% in her perceived level of functional impairment resulting from lymphedema to improve functional performance and quality of life (QOL). Baseline: 20.59%  Goal status: 10/07/22 PROGRESSING  2.  Given this patient's Intake score of TBA/100% on the functional outcomes FOTO tool, patient will experience an increase in function of 5 points  to improve basic and instrumental ADLs performance, including lymphedema self-care. Baseline:  TBA Goal status: 10/07/22 PROGRESSING  3.   Pt will demonstrate understanding of lymphedema precautions and prevention strategies with modified independence using a printed reference to identify at least 5 precautions and discussing how s/he may implement them into daily life to reduce risk of progression and to limit infection risk. Baseline: Max A Goal status:10/07/22 GOAL MET  4.  Pt will be able to don and doff prophylactic compression arm sleeve using assistive devices and extra time (modified independent), and discuss wear and garment care recommendations  to limit lymphedema by issue date. Baseline: dependent Goal status: 09/15/22 GOAL MET  5.  Pt will demonstrate the ability to perform all lymphedema self-care home program components with printed reference sheets (modified independence) by DC to limit lymphedema progression Baseline:  dependent Goal status: 10/07/22 PROGRESSING  6.  Pt will report 0/10 shoulder pain with AROM in all planes at end ranges bilaterally against gravity by DC for optimal functional performance of basic and instrumental ADLs, work and productive activities, leisure pursuits and social participation, Baseline: 2/10 Goal status: 10/07/22 PROGRESSING  PLAN:  PT FREQUENCY: 1-2x/week  PT DURATION: 12 weeks and PRN  PLANNED INTERVENTIONS: Therapeutic exercises, Therapeutic activity, Neuromuscular re-education, Balance training, Gait training, Patient/Family education, Self Care, and Joint mobilization  PLAN FOR NEXT SESSION:  Cont  LUE/LUQ MLD , scar massage and myofascial release. Cont L trunk and chest wall Kinesiotape as tolerated Continue teaching LE self care Assess BUE AROM Complete comparative limb volumetrics for LUE  Andrey Spearman, MS, OTR/L, CLT-LANA 10/08/22 12:32 PM

## 2022-10-09 ENCOUNTER — Encounter: Payer: Commercial Managed Care - HMO | Admitting: Occupational Therapy

## 2022-10-12 ENCOUNTER — Ambulatory Visit: Payer: Commercial Managed Care - HMO | Admitting: Occupational Therapy

## 2022-10-13 ENCOUNTER — Ambulatory Visit: Payer: Commercial Managed Care - HMO | Admitting: Occupational Therapy

## 2022-10-14 ENCOUNTER — Encounter: Payer: Self-pay | Admitting: *Deleted

## 2022-10-15 ENCOUNTER — Telehealth: Payer: Self-pay | Admitting: *Deleted

## 2022-10-15 ENCOUNTER — Ambulatory Visit: Payer: Commercial Managed Care - HMO | Admitting: Occupational Therapy

## 2022-10-15 ENCOUNTER — Encounter: Payer: Commercial Managed Care - HMO | Admitting: Occupational Therapy

## 2022-10-15 DIAGNOSIS — I972 Postmastectomy lymphedema syndrome: Secondary | ICD-10-CM

## 2022-10-15 NOTE — Telephone Encounter (Signed)
Pt sent message that she needs monthly [employer]  papers. I filled out form and and the transmission went through. Pt was told that I will send it today. I had to ask her about how much she is working but it is not her job she had and if she has got a check from them. She says she started 10/06/2022 and works 2 hours 4 to 5 days each week. No she has not got a check yet.

## 2022-10-15 NOTE — Therapy (Signed)
OUTPATIENT OCCUPATIONAL THERAPY  TREATMENT NOTE and PROGRESS REPORT [place] POST-MASTECTOMY LYMPHEDEMA  Patient Name: Vickie Mathis MRN: [id number] DOB:Mar 08, 1991, 32 y.o., female Today's Date: 10/16/2022  END OF SESSION:  OT End of Session - 10/15/22 1414     Visit Number 10    Number of Visits 36    Date for OT Re-Evaluation 11/22/22    OT Start Time 0210    OT Stop Time 0310    OT Time Calculation (min) 60 min    Activity Tolerance Patient tolerated treatment well;No increased pain    Behavior During Therapy WFL for tasks assessed/performed                   Past Medical History:  Diagnosis Date   Anxiety    Asthma    Breast cancer ([place]) 11/2020   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 AXILLARY NODE DISSECTION;  Surgeon: Herbert Pun, MD;  Location: [facility];  Service: General;  Laterality: Bilateral;  [place], 1.5 hours Cintron-Diaz 2.5 hours   BREAST BIOPSY Left 11/26/2020   vision 12:00 6cmfn [facility]   BREAST BIOPSY Left 11/26/2020   LN bx, hydro marker,  fragments of macrometastatic carcinoma   BREAST RECONSTRUCTION WITH PLACEMENT OF TISSUE EXPANDER AND FLEX HD (ACELLULAR HYDRATED DERMIS) Bilateral 06/25/2021   Procedure: BREAST RECONSTRUCTION WITH PLACEMENT OF TISSUE EXPANDER AND FLEX HD (ACELLULAR HYDRATED DERMIS);  Surgeon: Wallace Going, DO;  Location: [facility];  Service: Plastics;  Laterality: Bilateral;   PORTACATH PLACEMENT Right 12/13/2020   Procedure: INSERTION PORT-A-CATH;  Surgeon: Herbert Pun, MD;  Location: [facility];  Service: General;  Laterality: Right;   REMOVAL OF  BILATERAL TISSUE EXPANDERS WITH PLACEMENT OF BILATERAL BREAST IMPLANTS Bilateral 09/15/2021   Procedure: REMOVAL OF BILATERAL TISSUE EXPANDERS;  Surgeon: Wallace Going, DO;  Location: [facility];  Service: Plastics;  Laterality: Bilateral;   Patient Active Problem List   Diagnosis Date Noted   Chemotherapy-induced neuropathy ([place]) 02/06/2022   Right sided weakness 02/06/2022   Transaminitis 11/20/2021   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 10/25/2021   Malignant neoplasm of left breast in female, estrogen receptor positive ([place]) 06/25/2021   Genetic testing 01/15/2021   Family history of cancer    Invasive carcinoma of breast ([place]) 12/03/2020   Breast mass, left 07/01/2020   Situational mixed anxiety and depressive disorder 07/01/2020   Anxiety state 08/06/2014   Varicose veins of lower extremity 08/06/2014    PCP: Juluis Pitch, MD   REFERRING PROVIDER: Sindy Guadeloupe, MD  REFERRING DIAG: 197.2  THERAPY DIAG: Post-mastectomy lymphedema syndrome  ONSET DATE: 9/22  Rationale for Evaluation and Treatment: Rehabilitation  SUBJECTIVE  SUBJECTIVE STATEMENT: Vickie Mathis presents to OT to address LUE/LUQ post-mastectomy lymphedema. Pt reports 2-4/10 pain in L lateral trunk adjacent to axilla in serratus region and at L chest wall surgical site. She reports she has returned to light duty work at the [employer] 2 hours/day. "I've had 8 shifts now. They have me in this really hot room. I wear my sleeve."  PERTINENT HISTORY: 2021 self palpated L breast mass. Diagnostic mammogram and ultrasound revealed 2.7 cm mass at 12 o'clock 6 cm from the nipple. L axillary ultrasound revealed 2 suspicious LN. Both breast mass and LN + for invasive mammary carcinoma grade 3, ER/PR and her 2  negative.R breast non-mass enhancement was negative for Br Ca.Pt underwent neoadjuvant chemotherapy. 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 She underwent bilateral mastectomy with reconstruction in 9/22.Pathology revealed complete pathological response with 2 LN negative and R breast negative for malignancy. Breast implants removed 2/2 staff infection. Anxiety, chemo-induced neuropathy, intermittent L chest wall pain,   IMPAIRMENTS: chronic pain L chest wall and lateral trunk s/p bilateral mastectomy and ALND, fatigue, altered sensation 2/2 chemo-induced neuropathy   FUNCTIONAL LIMITATIONS: pain at end ranges BUE shoulders, R>L; decreased hand function, limited participation in productive activities and work while recovering from cancer treatment.  OTHER OBSTACLES TO OT LE CARE: none identified at initial eval  PAIN:  Are you having pain? Yes NPRS scale: 4/10 Pain location: L lateral trunk and chest at mastectomy site PAIN TYPE: chronic Pain description: intermittent , sore, tender, tight numb Aggravating factors: lifting, carrying, pushing, pulling, reaching overhead Relieving factors: unknown   PRECAUTIONS: Other: lymphedema precautions   PATIENT GOALS: relieve post-mastectomy pain and limit LE progression   OBJECTIVE BLE COMPARATIVE LIMB VOLUMETRICS:   09/02/22 INITIAL LUE  LANDMARK LEFT  09/02/22  L MPs 20.5 cm  RUE (excluding hand)  2910.9 ml     Limb Volume differential (LVD)  %  Volume change since initial %  Volume change overall %  (Blank rows = not tested) :   LANDMARK LEFT  10/15/12 (10 th VISIT)  L MPs 20.5 cm  RUE (excluding hand)  2910.9 ml     Limb Volume differential (LVD)  %  Volume change since initial LUE volume INCrease 8.3 % since initially measured on 09/02/22  Volume change overall %  (Blank rows = not tested) :   PATIENT SURVEYS:  FOTO functional outcome measure: Intake TBA OT Rx visit 1    LYMPHEDEMA LIFE IMPACT SCALE: (The extent to which lymphedema-related problems affected your life last week)  Intake score: 20.59%    TODAY'S TREATMENT:                                                                                                                                        Pt and family edu for LE self-care and progress towards all goals  LUE/LUQ MLD as established with simultaneous skin care Kinesiotape: 2" wide, 3 finger fan cuts applied to L lateral trunk and chest wall anchoring at contralateral spine and extending anteriorly across surgical scars.  Comparative limb volumetrics  PATIENT EDUCATION: Continued Pt/ CG edu for lymphedema self care home program throughout session. Topics include outcome of comparative limb volumetrics- starting limb volume differentials (LVDs), technology and gradient techniques used for short stretch, multilayer compression wrapping, simple self-MLD, therapeutic lymphatic pumping exercises, skin/nail care, LE precautions,. compression garment recommendations and specifications, wear and care schedule and compression garment donning / doffing w assistive devices. Discussed progress towards all OT goals since commencing CDT. All questions answered to the Pt's satisfaction. Good return.  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 2 x daily PRN 2.  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 Belisse compression bra ( 39 a/b) and a JoviPak double mastectomy pad for HOS to reduce fibrosis and facilitate improved lymphatic function to limit progression. 3. Daily skin care to  sustain optimal hydration. Wash bites, scratches, blisters, etc with mild soap and water , apply antibacterial first aide cream and a band aide. 4. Simple self-Manual lymphatic drainage (MLD) PRN.  ASSESSMENT:  CLINICAL IMPRESSION:  Pt continues to demonstrate progress towards OT goals for lymphedema care. Please refer to GOALS section of this note for specific details. Pt has mastered simple self-MLD and she is performing it at home between visits. Pt's spouse has learned to apply Kinesiotape and Pt is also using this regularly for scar massage and to encourage lymphatic function through scar and radiation fibrosis. LUE limb volume is 8.3 % since initially measured on 09/02/22. 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 She is compliant with prophylactic compression arm sleeve as directed, including at work. Unfortunately sensory discomfort and pain at L lateral trunk and chest wall is unchanged since commencing OT for CDT. We'll continue to monitor limb volume closely. Cont as per POC.  Pt tolerated MLD to LUE/LUQ without increased pain today during remainder of session. Emphasis of manual therapy on lateral trunk along ribs and adjacent to axilla.   OBJECTIVE IMPAIRMENTS: decreased knowledge of lymphedema ;  decreased knowledge of use of DME, decreased ROM, increased edema, impaired sensation, and pain with onset in   ACTIVITY LIMITATIONS: carrying, lifting, bed mobility, bathing, dressing, reach over head, and hygiene/grooming  PARTICIPATION LIMITATIONS: occupation  PERSONAL FACTORS:  Hx comorbid anxiety  is also affecting patient's functional outcome.   REHAB POTENTIAL: Good  CLINICAL DECISION MAKING: Stable/uncomplicated   GOALS: Goals reviewed with patient? Yes  LONG TERM GOALS:  Target date: 11/22/21   Given this patient's Intake score of 20.59/100% on the Lymphedema Life Impact Scale (LLIS), patient will experience a reduction of at least 5% in her perceived level of functional impairment resulting from lymphedema to improve functional performance and quality of life (QOL). Baseline: 20.59%  Goal status: 10/07/22 PROGRESSING  2.  Given this patient's Intake score of TBA/100% on  the functional outcomes FOTO tool, patient will experience an increase in function of 5 points  to improve basic and instrumental ADLs performance, including lymphedema self-care. Baseline:  TBA Goal status: 10/07/22 PROGRESSING  3.   Pt will demonstrate understanding of lymphedema precautions and prevention strategies with modified independence using a printed reference to identify at least 5 precautions and discussing how s/he may implement them into daily life to reduce risk of progression and to limit infection risk. Baseline: Max A Goal status:10/07/22 GOAL MET  4.  Pt will be able to don and doff prophylactic compression arm sleeve using assistive devices and extra time (modified independent), and discuss wear and garment care recommendations  to limit lymphedema by issue date. Baseline: dependent Goal status: 09/15/22 GOAL MET  5.  Pt will demonstrate the ability to perform all lymphedema self-care home program components with printed reference sheets (modified independence) by DC to limit lymphedema progression Baseline: dependent Goal status: 10/07/22 PROGRESSING  6.  Pt will report 0/10 shoulder pain with AROM in all planes at end ranges bilaterally against gravity by DC for optimal functional performance of basic and instrumental ADLs, work and productive activities, leisure pursuits and social participation, Baseline: 2/10 Goal status: 10/07/22 PROGRESSING  PLAN:  PT FREQUENCY: 1-2x/week  PT DURATION: 12 weeks and PRN  PLANNED INTERVENTIONS: Therapeutic exercises, Therapeutic  activity, Neuromuscular re-education, Balance training, Gait training, Patient/Family education, Self Care, and Joint mobilization  PLAN FOR NEXT SESSION:  Cont LUE/LUQ MLD , scar massage and myofascial release. Cont L trunk and chest wall Kinesiotape as tolerated Continue teaching LE self care Assess BUE AROM Complete comparative limb volumetrics for LUE  Andrey Spearman, MS, OTR/L, CLT-LANA 10/16/22 11:40 AM

## 2022-10-16 ENCOUNTER — Inpatient Hospital Stay: Payer: Commercial Managed Care - HMO | Attending: Oncology

## 2022-10-16 ENCOUNTER — Inpatient Hospital Stay: Payer: Commercial Managed Care - HMO

## 2022-10-16 DIAGNOSIS — Z853 Personal history of malignant neoplasm of breast: Secondary | ICD-10-CM | POA: Diagnosis present

## 2022-10-16 DIAGNOSIS — Z95828 Presence of other vascular implants and grafts: Secondary | ICD-10-CM

## 2022-10-16 DIAGNOSIS — K754 Autoimmune hepatitis: Secondary | ICD-10-CM | POA: Diagnosis present

## 2022-10-16 DIAGNOSIS — R7401 Elevation of levels of liver transaminase levels: Secondary | ICD-10-CM

## 2022-10-16 DIAGNOSIS — Z171 Estrogen receptor negative status [ER-]: Secondary | ICD-10-CM

## 2022-10-16 LAB — COMPREHENSIVE METABOLIC PANEL
ALT: 60 U/L — ABNORMAL HIGH (ref 0–44)
AST: 55 U/L — ABNORMAL HIGH (ref 15–41)
Albumin: 4.6 g/dL (ref 3.5–5.0)
Alkaline Phosphatase: 85 U/L (ref 38–126)
Anion gap: 10 (ref 5–15)
BUN: 7 mg/dL (ref 6–20)
CO2: 23 mmol/L (ref 22–32)
Calcium: 8.2 mg/dL — ABNORMAL LOW (ref 8.9–10.3)
Chloride: 104 mmol/L (ref 98–111)
Creatinine, Ser: 0.56 mg/dL (ref 0.44–1.00)
GFR, Estimated: >60 mL/min (ref >60)
Glucose, Bld: 82 mg/dL (ref 70–99)
Potassium: 3.6 mmol/L (ref 3.5–5.1)
Sodium: 137 mmol/L (ref 135–145)
Total Bilirubin: 0.3 mg/dL (ref 0.3–1.2)
Total Protein: 7.2 g/dL (ref 6.5–8.1)

## 2022-10-16 LAB — CBC WITH DIFFERENTIAL/PLATELET
Abs Immature Granulocytes: 0.03 10*3/uL (ref 0.00–0.07)
Basophils Absolute: 0.1 10*3/uL (ref 0.0–0.1)
Basophils Relative: 1 %
Eosinophils Absolute: 0.1 10*3/uL (ref 0.0–0.5)
Eosinophils Relative: 1 %
HCT: 38.8 % (ref 36.0–46.0)
Hemoglobin: 13.1 g/dL (ref 12.0–15.0)
Immature Granulocytes: 0 %
Lymphocytes Relative: 19 %
Lymphs Abs: 1.5 10*3/uL (ref 0.7–4.0)
MCH: 31.6 pg (ref 26.0–34.0)
MCHC: 33.8 g/dL (ref 30.0–36.0)
MCV: 93.5 fL (ref 80.0–100.0)
Monocytes Absolute: 0.6 10*3/uL (ref 0.1–1.0)
Monocytes Relative: 8 %
Neutro Abs: 5.6 10*3/uL (ref 1.7–7.7)
Neutrophils Relative %: 71 %
Platelets: 224 10*3/uL (ref 150–400)
RBC: 4.15 MIL/uL (ref 3.87–5.11)
RDW: 16.5 % — ABNORMAL HIGH (ref 11.5–15.5)
WBC: 7.9 10*3/uL (ref 4.0–10.5)
nRBC: 0 % (ref 0.0–0.2)

## 2022-10-16 MED ORDER — HEPARIN SOD (PORK) LOCK FLUSH 100 UNIT/ML IV SOLN
500.0000 [IU] | Freq: Once | INTRAVENOUS | Status: AC
  Administered 2022-10-16: 500 [IU] via INTRAVENOUS
  Filled 2022-10-16: qty 5

## 2022-10-16 MED ORDER — SODIUM CHLORIDE 0.9% FLUSH
10.0000 mL | Freq: Once | INTRAVENOUS | Status: AC
  Administered 2022-10-16: 10 mL via INTRAVENOUS
  Filled 2022-10-16: qty 10

## 2022-10-20 ENCOUNTER — Ambulatory Visit: Payer: Commercial Managed Care - HMO | Admitting: Occupational Therapy

## 2022-10-21 ENCOUNTER — Ambulatory Visit: Payer: Commercial Managed Care - HMO | Admitting: Occupational Therapy

## 2022-10-21 DIAGNOSIS — I972 Postmastectomy lymphedema syndrome: Secondary | ICD-10-CM | POA: Diagnosis not present

## 2022-10-21 NOTE — Therapy (Signed)
OUTPATIENT OCCUPATIONAL THERAPY  TREATMENT NOTE and PROGRESS REPORT [place] POST-MASTECTOMY LYMPHEDEMA  Patient Name: Guenevere Roorda MRN: [id number] DOB:Sep 26, 1991, 32 y.o., female Today's Date: 10/21/2022  END OF SESSION:  OT End of Session - 10/21/22 1523     Visit Number 11    Number of Visits 36    Date for OT Re-Evaluation 11/22/22    OT Start Time 0310    OT Stop Time 0355    OT Time Calculation (min) 45 min    Activity Tolerance Patient tolerated treatment well;No increased pain    Behavior During Therapy WFL for tasks assessed/performed                   Past Medical History:  Diagnosis Date   Anxiety    Asthma    Breast cancer ([place]) 11/2020   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 AXILLARY NODE DISSECTION;  Surgeon: Herbert Pun, MD;  Location: [facility];  Service: General;  Laterality: Bilateral;  [place], 1.5 hours Cintron-Diaz 2.5 hours   BREAST BIOPSY Left 11/26/2020   vision 12:00 6cmfn [facility]   BREAST BIOPSY Left 11/26/2020   LN bx, hydro marker,  fragments of macrometastatic carcinoma   BREAST RECONSTRUCTION WITH PLACEMENT OF TISSUE EXPANDER AND FLEX HD (ACELLULAR HYDRATED DERMIS) Bilateral 06/25/2021   Procedure: BREAST RECONSTRUCTION WITH PLACEMENT OF TISSUE EXPANDER AND FLEX HD (ACELLULAR HYDRATED DERMIS);  Surgeon: Wallace Going, DO;  Location: [facility];  Service: Plastics;  Laterality: Bilateral;   PORTACATH PLACEMENT Right 12/13/2020   Procedure: INSERTION PORT-A-CATH;  Surgeon: Herbert Pun, MD;  Location: [facility];  Service: General;  Laterality: Right;   REMOVAL OF  BILATERAL TISSUE EXPANDERS WITH PLACEMENT OF BILATERAL BREAST IMPLANTS Bilateral 09/15/2021   Procedure: REMOVAL OF BILATERAL TISSUE EXPANDERS;  Surgeon: Wallace Going, DO;  Location: [place];  Service: Plastics;  Laterality: Bilateral;   Patient Active Problem List   Diagnosis Date Noted   Chemotherapy-induced neuropathy ([place]) 02/06/2022   Right sided weakness 02/06/2022   Transaminitis 11/20/2021   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 10/25/2021   Malignant neoplasm of left breast in female, estrogen receptor positive ([place]) 06/25/2021   Genetic testing 01/15/2021   Family history of cancer    Invasive carcinoma of breast ([place]) 12/03/2020   Breast mass, left 07/01/2020   Situational mixed anxiety and depressive disorder 07/01/2020   Anxiety state 08/06/2014   Varicose veins of lower extremity 08/06/2014    PCP: Juluis Pitch, MD   REFERRING PROVIDER: Sindy Guadeloupe, MD  REFERRING DIAG: 197.2  THERAPY DIAG: Postmastectomy lymphedema syndrome  ONSET DATE: 9/22  Rationale for Evaluation and Treatment: Rehabilitation  SUBJECTIVE  SUBJECTIVE STATEMENT: Waunetta Riggle presents to OT to address LUE/LUQ post-mastectomy lymphedema. Pt reports 2-4/10 pain in L lateral trunk adjacent to axilla in serratus region and at L chest wall surgical site. She reports she's been back to work for the last few weeks and keeps getting paper cuts on her hands. OT suggested she wear gloves at work, even if she cuts a couple of the finger tips off of them, to limit injury and infection rik. Pt agrees to wash hands frequently.  PERTINENT HISTORY: 2021 self palpated L breast mass. Diagnostic mammogram and ultrasound revealed 2.7 cm mass at 12 o'clock 6 cm from the nipple. L axillary ultrasound  revealed 2 suspicious LN. Both breast mass and LN + for invasive mammary carcinoma grade 3, ER/PR and her 2 negative.R breast non-mass enhancement was negative for Br Ca.Pt underwent neoadjuvant chemotherapy. 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 She underwent bilateral mastectomy with reconstruction in 9/22.Pathology revealed complete pathological response with 2 LN negative and R breast negative for malignancy. Breast implants removed 2/2 staff infection. Anxiety, chemo-induced neuropathy, intermittent L chest wall pain,   IMPAIRMENTS: chronic pain L chest wall and lateral trunk s/p bilateral mastectomy and ALND, fatigue, altered sensation 2/2 chemo-induced neuropathy   FUNCTIONAL LIMITATIONS: pain at end ranges BUE shoulders, R>L; decreased hand function, limited participation in productive activities and work while recovering from cancer treatment.  OTHER OBSTACLES TO OT LE CARE: none identified at initial eval  PAIN:  Are you having pain? Yes NPRS scale: 4/10 Pain location: L lateral trunk and chest at mastectomy site PAIN TYPE: chronic Pain description: intermittent , sore, tender, tight numb Aggravating factors: lifting, carrying, pushing, pulling, reaching overhead Relieving factors: unknown   PRECAUTIONS: Other: lymphedema precautions   PATIENT GOALS: relieve post-mastectomy pain and limit LE progression   OBJECTIVE BLE COMPARATIVE LIMB VOLUMETRICS:   09/02/22 INITIAL LUE  LANDMARK LEFT  09/02/22  L MPs 20.5 cm  RUE (excluding hand)  2910.9 ml     Limb Volume differential (LVD)  %  Volume change since initial %  Volume change overall %  (Blank rows = not tested) :   LANDMARK LEFT  10/15/12 (10 th VISIT)  L MPs 20.5 cm  RUE (excluding hand)  2910.9 ml     Limb Volume differential (LVD)  %  Volume change since initial LUE volume INCrease 8.3 % since initially measured on 09/02/22  Volume change overall %  (Blank rows  = not tested) :   PATIENT SURVEYS:  FOTO functional outcome measure: Intake TBA OT Rx visit 1   LYMPHEDEMA LIFE IMPACT SCALE: (The extent to which lymphedema-related problems affected your life last week)  Intake score: 20.59%    TODAY'S TREATMENT:  Pt and family edu for LE self-care and progress towards all goals LUE/LUQ MLD as established with simultaneous skin care No kinesio tape today due to time constraints (Kinesiotape: 2" wide, 3 finger fan cuts applied to L lateral trunk and chest wall anchoring at contralateral spine and extending anteriorly across surgical scars. )   PATIENT EDUCATION: Continued Pt/ CG edu for lymphedema self care home program throughout session. Topics include outcome of comparative limb volumetrics- starting limb volume differentials (LVDs), technology and gradient techniques used for short stretch, multilayer compression wrapping, simple self-MLD, therapeutic lymphatic pumping exercises, skin/nail care, LE precautions,. compression garment recommendations and specifications, wear and care schedule and compression garment donning / doffing w assistive devices. Discussed progress towards all OT goals since commencing CDT. All questions answered to the Pt's satisfaction. Good return.  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 2 x daily PRN 2.  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 Belisse compression bra ( 39 a/b) and a JoviPak double mastectomy  pad for HOS to reduce fibrosis and facilitate improved lymphatic function to limit progression. 3. Daily skin care to sustain optimal hydration. Wash bites, scratches, blisters, etc with mild soap and water , apply antibacterial first aide cream and a band aide. 4. Simple self-Manual lymphatic drainage (MLD) PRN.  ASSESSMENT:  CLINICAL IMPRESSION:  Pt tolerated MLD to LUE/LUQ without increased pain today during remainder of session. Emphasis of manual therapy , MLD and fibrosis techniques, on lateral trunk along ribs and adjacent to axilla. Pt continues to present with pain at lateral trunk and surgical sites; swelling has reduced at chest wall and lateral trunk near axillae. Cont as per POC to reduce sensory and physical symptoms. Provided a few pairs of latex gloves for Pt to try at work to limit infection risk and injury to hands.  OBJECTIVE IMPAIRMENTS: decreased knowledge of lymphedema ;  decreased knowledge of use of DME, decreased ROM, increased edema, impaired sensation, and pain with onset in   ACTIVITY LIMITATIONS: carrying, lifting, bed mobility, bathing, dressing, reach over head, and hygiene/grooming  PARTICIPATION LIMITATIONS: occupation  PERSONAL FACTORS:  Hx comorbid anxiety  is also affecting patient's functional outcome.   REHAB POTENTIAL: Good  CLINICAL DECISION MAKING: Stable/uncomplicated   GOALS: Goals reviewed with patient? Yes  LONG TERM GOALS: Target date: 11/22/21   Given this patient's Intake score of 20.59/100% on the Lymphedema Life Impact Scale (LLIS), patient will experience a reduction of at least 5% in her perceived level of functional impairment resulting from lymphedema to improve functional performance and quality of life (QOL). Baseline: 20.59%  Goal status: 10/07/22 PROGRESSING  2.  Given this patient's Intake score of TBA/100% on the functional outcomes FOTO tool, patient will experience an increase in function of 5 points  to improve basic and  instrumental ADLs performance, including lymphedema self-care. Baseline:  TBA Goal status: 10/07/22 PROGRESSING  3.   Pt will demonstrate understanding of lymphedema precautions and prevention strategies with modified independence using a printed reference to identify at least 5 precautions and discussing how s/he may implement them into daily life to reduce risk of progression and to limit infection risk. Baseline: Max A Goal status:10/07/22 GOAL MET  4.  Pt will be able to don and doff prophylactic compression arm sleeve using assistive devices and extra time (modified independent), and discuss wear and garment care recommendations  to limit lymphedema by issue date. Baseline: dependent Goal status: 09/15/22 GOAL MET  5.  Pt will demonstrate the ability to perform all lymphedema self-care home program components with printed reference sheets (modified independence) by DC to limit lymphedema progression Baseline: dependent Goal status: 10/07/22 PROGRESSING  6.  Pt will report 0/10 shoulder pain with AROM in all planes at end ranges bilaterally against gravity by DC for optimal functional performance of basic and instrumental ADLs, work and productive activities, leisure pursuits and social participation, Baseline: 2/10 Goal status: 10/07/22 PROGRESSING  PLAN:  PT FREQUENCY: 1-2x/week  PT DURATION: 12 weeks and PRN  PLANNED INTERVENTIONS: Therapeutic exercises, Therapeutic activity, Neuromuscular re-education, Balance training, Gait training, Patient/Family education, Self Care, and Joint mobilization  PLAN FOR NEXT SESSION:  Cont LUE/LUQ MLD , scar massage and myofascial release. Cont L trunk and chest wall Kinesiotape as tolerated Continue teaching LE self care Assess BUE AROM Complete comparative limb volumetrics for LUE  Andrey Spearman, MS, OTR/L, CLT-LANA 10/21/22 3:57 PM

## 2022-10-22 ENCOUNTER — Encounter: Payer: Commercial Managed Care - HMO | Admitting: Occupational Therapy

## 2022-10-22 ENCOUNTER — Ambulatory Visit: Payer: Commercial Managed Care - HMO | Admitting: Occupational Therapy

## 2022-10-22 DIAGNOSIS — I972 Postmastectomy lymphedema syndrome: Secondary | ICD-10-CM

## 2022-10-23 ENCOUNTER — Encounter: Payer: Self-pay | Admitting: Occupational Therapy

## 2022-10-23 NOTE — Therapy (Signed)
OUTPATIENT OCCUPATIONAL THERAPY  TREATMENT NOTE and PROGRESS REPORT Vickie Mathis POST-MASTECTOMY LYMPHEDEMA  Patient Name: Vickie Mathis MRN: [id number] DOB:10-20-1990, 32 y.o., female Today's Date: 10/23/2022  END OF SESSION:  OT End of Session - 10/22/22 1317     Visit Number 12    Number of Visits 36    Date for OT Re-Evaluation 11/22/22    OT Start Time 0300    OT Stop Time 0350   appt length abbreviated again today by Pt request   OT Time Calculation (min) 50 min    Activity Tolerance Patient tolerated treatment well;No increased pain    Behavior During Therapy WFL for tasks assessed/performed                   Past Medical History:  Diagnosis Date   Anxiety    Asthma    Breast cancer ([place]) 11/2020   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 AXILLARY NODE DISSECTION;  Surgeon: Herbert Pun, MD;  Location: [facility];  Service: General;  Laterality: Bilateral;  [place], 1.5 hours Cintron-Diaz 2.5 hours   BREAST BIOPSY Left 11/26/2020   vision 12:00 6cmfn [facility]   BREAST BIOPSY Left 11/26/2020   LN bx, hydro marker,  fragments of macrometastatic carcinoma   BREAST RECONSTRUCTION WITH PLACEMENT OF TISSUE EXPANDER AND FLEX HD (ACELLULAR HYDRATED DERMIS) Bilateral 06/25/2021   Procedure: BREAST RECONSTRUCTION WITH PLACEMENT OF TISSUE EXPANDER AND FLEX HD (ACELLULAR HYDRATED DERMIS);  Surgeon: Wallace Going, DO;  Location: [facility];  Service: Plastics;  Laterality: Bilateral;   PORTACATH PLACEMENT Right 12/13/2020   Procedure: INSERTION PORT-A-CATH;  Surgeon: Herbert Pun, MD;  Location: [facility];   Service: General;  Laterality: Right;   REMOVAL OF BILATERAL TISSUE EXPANDERS WITH PLACEMENT OF BILATERAL BREAST IMPLANTS Bilateral 09/15/2021   Procedure: REMOVAL OF BILATERAL TISSUE EXPANDERS;  Surgeon: Wallace Going, DO;  Location: [place];  Service: Plastics;  Laterality: Bilateral;   Patient Active Problem List   Diagnosis Date Noted   Chemotherapy-induced neuropathy ([place]) 02/06/2022   Right sided weakness 02/06/2022   Transaminitis 11/20/2021   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 10/25/2021   Malignant neoplasm of left breast in female, estrogen receptor positive ([place]) 06/25/2021   Genetic testing 01/15/2021   Family history of cancer    Invasive carcinoma of breast ([place]) 12/03/2020   Breast mass, left 07/01/2020   Situational mixed anxiety and depressive disorder 07/01/2020   Anxiety state 08/06/2014   Varicose veins of lower extremity 08/06/2014    PCP: Juluis Pitch, MD   REFERRING PROVIDER: Sindy Guadeloupe, MD  REFERRING DIAG: 197.2  THERAPY DIAG: Postmastectomy lymphedema syndrome  ONSET DATE: 9/22  SUBJECTIVE  SUBJECTIVE STATEMENT: Vickie Mathis presents to OT to address LUE/LUQ post-mastectomy lymphedema. Pt reports 2-4/10 pain in L lateral trunk adjacent to axilla in serratus region and at L chest wall surgical site. She reports she's been back to work for the last few weeks and keeps getting paper cuts on her hands. OT suggested she wear gloves at work, even if she cuts a couple of the finger tips off of them, to limit injury and infection rik. Pt agrees to wash hands frequently.  PERTINENT HISTORY: 2021 self palpated L breast mass. Diagnostic mammogram and ultrasound revealed 2.7 cm mass at 12 o'clock 6 cm from the nipple. L axillary ultrasound  revealed 2 suspicious LN. Both breast mass and LN + for invasive mammary carcinoma grade 3, ER/PR and her 2 negative.R breast non-mass enhancement was negative for Br Ca.Pt underwent neoadjuvant chemotherapy. 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 She underwent bilateral mastectomy with reconstruction in 9/22.Pathology revealed complete pathological response with 2 LN negative and R breast negative for malignancy. Breast implants removed 2/2 staff infection. Anxiety, chemo-induced neuropathy, intermittent L chest wall pain,   IMPAIRMENTS: chronic pain L chest wall and lateral trunk s/p bilateral mastectomy and ALND, fatigue, altered sensation 2/2 chemo-induced neuropathy   FUNCTIONAL LIMITATIONS: pain at end ranges BUE shoulders, R>L; decreased hand function, limited participation in productive activities and work while recovering from cancer treatment.  OTHER OBSTACLES TO OT LE CARE: none identified at initial eval  PAIN:  Are you having pain? Yes NPRS scale: 4/10 Pain location: L lateral trunk and chest at mastectomy site PAIN TYPE: chronic Pain description: intermittent , sore, tender, tight numb Aggravating factors: lifting, carrying, pushing, pulling, reaching overhead Relieving factors: unknown   PRECAUTIONS: Other: lymphedema precautions   PATIENT GOALS: relieve post-mastectomy pain and limit LE progression   OBJECTIVE BLE COMPARATIVE LIMB VOLUMETRICS:   09/02/22 INITIAL LUE  LANDMARK LEFT  09/02/22  L MPs 20.5 cm  RUE (excluding hand)  2910.9 ml     Limb Volume differential (LVD)  %  Volume change since initial %  Volume change overall %  (Blank rows = not tested) :   LANDMARK LEFT  10/15/12 (10 th VISIT)  L MPs 20.5 cm  RUE (excluding hand)  2910.9 ml     Limb Volume differential (LVD)  %  Volume change since initial LUE volume INCrease 8.3 % since initially measured on 09/02/22  Volume change overall %  (Blank rows  = not tested) :   PATIENT SURVEYS:  FOTO functional outcome measure: Intake TBA OT Rx visit 1   LYMPHEDEMA LIFE IMPACT SCALE: (The extent to which lymphedema-related problems affected your life last week)  Intake score: 20.59%    TODAY'S TREATMENT:  Pt and family edu for LE self-care and progress towards all goals LUE/LUQ MLD as established with simultaneous skin care No kinesio tape today due to time constraints (Kinesiotape: 2" wide, 3 finger fan cuts applied to L lateral trunk and chest wall anchoring at contralateral spine and extending anteriorly across surgical scars. )   PATIENT EDUCATION: Continued Pt/ CG edu for lymphedema self care home program throughout session. Topics include outcome of comparative limb volumetrics- starting limb volume differentials (LVDs), technology and gradient techniques used for short stretch, multilayer compression wrapping, simple self-MLD, therapeutic lymphatic pumping exercises, skin/nail care, LE precautions,. compression garment recommendations and specifications, wear and care schedule and compression garment donning / doffing w assistive devices. Discussed progress towards all OT goals since commencing CDT. All questions answered to the Pt's satisfaction. Good return.  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 2 x daily PRN 2.  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 Belisse compression bra ( 39 a/b) and a JoviPak double mastectomy  pad for HOS to reduce fibrosis and facilitate improved lymphatic function to limit progression. 3. Daily skin care to sustain optimal hydration. Wash bites, scratches, blisters, etc with mild soap and water , apply antibacterial first aide cream and a band aide. 4. Simple self-Manual lymphatic drainage (MLD) PRN.  ASSESSMENT:  CLINICAL IMPRESSION:  Pt tolerated MLD to LUE/LUQ without increased pain today during remainder of session. Emphasis of manual therapy , MLD and fibrosis techniques, on lateral trunk along ribs and adjacent to axilla. Pt continues to present with pain at lateral trunk and surgical sites; swelling has reduced at chest wall and lateral trunk near axillae. Cont as per POC to reduce sensory and physical symptoms. Provided a few pairs of latex gloves for Pt to try at work to limit infection risk and injury to hands.  OBJECTIVE IMPAIRMENTS: decreased knowledge of lymphedema ;  decreased knowledge of use of DME, decreased ROM, increased edema, impaired sensation, and pain with onset in   ACTIVITY LIMITATIONS: carrying, lifting, bed mobility, bathing, dressing, reach over head, and hygiene/grooming  PARTICIPATION LIMITATIONS: occupation  PERSONAL FACTORS:  Hx comorbid anxiety  is also affecting patient's functional outcome.   REHAB POTENTIAL: Good  CLINICAL DECISION MAKING: Stable/uncomplicated   GOALS: Goals reviewed with patient? Yes  LONG TERM GOALS: Target date: 11/22/21   Given this patient's Intake score of 20.59/100% on the Lymphedema Life Impact Scale (LLIS), patient will experience a reduction of at least 5% in her perceived level of functional impairment resulting from lymphedema to improve functional performance and quality of life (QOL). Baseline: 20.59%  Goal status: 10/07/22 PROGRESSING  2.  Given this patient's Intake score of TBA/100% on the functional outcomes FOTO tool, patient will experience an increase in function of 5 points  to improve basic and  instrumental ADLs performance, including lymphedema self-care. Baseline:  TBA Goal status: 10/07/22 PROGRESSING  3.   Pt will demonstrate understanding of lymphedema precautions and prevention strategies with modified independence using a printed reference to identify at least 5 precautions and discussing how s/he may implement them into daily life to reduce risk of progression and to limit infection risk. Baseline: Max A Goal status:10/07/22 GOAL MET  4.  Pt will be able to don and doff prophylactic compression arm sleeve using assistive devices and extra time (modified independent), and discuss wear and garment care recommendations  to limit lymphedema by issue date. Baseline: dependent Goal status: 09/15/22 GOAL MET  5.  Pt will demonstrate the ability to perform all lymphedema self-care home program components with printed reference sheets (modified independence) by DC to limit lymphedema progression Baseline: dependent Goal status: 10/07/22 PROGRESSING  6.  Pt will report 0/10 shoulder pain with AROM in all planes at end ranges bilaterally against gravity by DC for optimal functional performance of basic and instrumental ADLs, work and productive activities, leisure pursuits and social participation, Baseline: 2/10 Goal status: 10/07/22 PROGRESSING  PLAN:  PT FREQUENCY: 1-2x/week  PT DURATION: 12 weeks and PRN  PLANNED INTERVENTIONS: Therapeutic exercises, Therapeutic activity, Neuromuscular re-education, Balance training, Gait training, Patient/Family education, Self Care, and Joint mobilization  PLAN FOR NEXT SESSION:  Cont LUE/LUQ MLD , scar massage and myofascial release. Cont L trunk and chest wall Kinesiotape as tolerated Continue teaching LE self care Assess BUE AROM Complete comparative limb volumetrics for LUE  Andrey Spearman, MS, OTR/L, CLT-LANA 10/23/22 1:19 PM

## 2022-10-26 ENCOUNTER — Inpatient Hospital Stay: Payer: Commercial Managed Care - HMO

## 2022-10-26 DIAGNOSIS — R7401 Elevation of levels of liver transaminase levels: Secondary | ICD-10-CM

## 2022-10-26 DIAGNOSIS — Z853 Personal history of malignant neoplasm of breast: Secondary | ICD-10-CM | POA: Diagnosis not present

## 2022-10-26 DIAGNOSIS — C50412 Malignant neoplasm of upper-outer quadrant of left female breast: Secondary | ICD-10-CM

## 2022-10-26 LAB — COMPREHENSIVE METABOLIC PANEL
ALT: 51 U/L — ABNORMAL HIGH (ref 0–44)
AST: 42 U/L — ABNORMAL HIGH (ref 15–41)
Albumin: 4.4 g/dL (ref 3.5–5.0)
Alkaline Phosphatase: 87 U/L (ref 38–126)
Anion gap: 10 (ref 5–15)
BUN: 11 mg/dL (ref 6–20)
CO2: 22 mmol/L (ref 22–32)
Calcium: 9.1 mg/dL (ref 8.9–10.3)
Chloride: 102 mmol/L (ref 98–111)
Creatinine, Ser: 0.78 mg/dL (ref 0.44–1.00)
GFR, Estimated: >60 mL/min (ref >60)
Glucose, Bld: 104 mg/dL — ABNORMAL HIGH (ref 70–99)
Potassium: 4.2 mmol/L (ref 3.5–5.1)
Sodium: 134 mmol/L — ABNORMAL LOW (ref 135–145)
Total Bilirubin: 0.5 mg/dL (ref 0.3–1.2)
Total Protein: 7.6 g/dL (ref 6.5–8.1)

## 2022-10-26 LAB — CBC WITH DIFFERENTIAL/PLATELET
Abs Immature Granulocytes: 0.03 10*3/uL (ref 0.00–0.07)
Basophils Absolute: 0.1 10*3/uL (ref 0.0–0.1)
Basophils Relative: 1 %
Eosinophils Absolute: 0.2 10*3/uL (ref 0.0–0.5)
Eosinophils Relative: 3 %
HCT: 38.5 % (ref 36.0–46.0)
Hemoglobin: 14 g/dL (ref 12.0–15.0)
Immature Granulocytes: 0 %
Lymphocytes Relative: 15 %
Lymphs Abs: 1 10*3/uL (ref 0.7–4.0)
MCH: 32.7 pg (ref 26.0–34.0)
MCHC: 36.4 g/dL — ABNORMAL HIGH (ref 30.0–36.0)
MCV: 90 fL (ref 80.0–100.0)
Monocytes Absolute: 0.7 10*3/uL (ref 0.1–1.0)
Monocytes Relative: 10 %
Neutro Abs: 4.8 10*3/uL (ref 1.7–7.7)
Neutrophils Relative %: 71 %
Platelets: 191 10*3/uL (ref 150–400)
RBC: 4.28 MIL/uL (ref 3.87–5.11)
RDW: 15 % (ref 11.5–15.5)
WBC: 6.8 10*3/uL (ref 4.0–10.5)
nRBC: 0 % (ref 0.0–0.2)

## 2022-10-26 MED ORDER — HEPARIN SOD (PORK) LOCK FLUSH 100 UNIT/ML IV SOLN
500.0000 [IU] | Freq: Once | INTRAVENOUS | Status: AC
  Administered 2022-10-26: 500 [IU] via INTRAVENOUS
  Filled 2022-10-26: qty 5

## 2022-10-26 MED ORDER — SODIUM CHLORIDE 0.9% FLUSH
10.0000 mL | Freq: Once | INTRAVENOUS | Status: AC
  Administered 2022-10-26: 10 mL via INTRAVENOUS
  Filled 2022-10-26: qty 10

## 2022-10-27 ENCOUNTER — Ambulatory Visit: Payer: Commercial Managed Care - HMO | Admitting: Occupational Therapy

## 2022-10-27 DIAGNOSIS — I972 Postmastectomy lymphedema syndrome: Secondary | ICD-10-CM

## 2022-10-29 ENCOUNTER — Encounter: Payer: Self-pay | Admitting: Occupational Therapy

## 2022-10-29 NOTE — Therapy (Signed)
OUTPATIENT OCCUPATIONAL THERAPY  TREATMENT NOTE Day POST-MASTECTOMY LYMPHEDEMA  Patient Name: Vickie Mathis MRN: [id number] DOB:04/30/1991, 32 y.o., female Today's Date: 10/29/2022  END OF SESSION:  OT End of Session - 10/29/22 0929     Visit Number 13    Number of Visits 36    Date for OT Re-Evaluation 11/22/22    OT Start Time 0105    OT Stop Time 0205    OT Time Calculation (min) 60 min    Activity Tolerance Patient tolerated treatment well;No increased pain    Behavior During Therapy WFL for tasks assessed/performed                   Past Medical History:  Diagnosis Date   Anxiety    Asthma    Breast cancer ([place]) 11/2020   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 AXILLARY NODE DISSECTION;  Surgeon: Herbert Pun, MD;  Location: [facility];  Service: General;  Laterality: Bilateral;  [place], 1.5 hours Cintron-Diaz 2.5 hours   BREAST BIOPSY Left 11/26/2020   vision 12:00 6cmfn [facility]   BREAST BIOPSY Left 11/26/2020   LN bx, hydro marker,  fragments of macrometastatic carcinoma   BREAST RECONSTRUCTION WITH PLACEMENT OF TISSUE EXPANDER AND FLEX HD (ACELLULAR HYDRATED DERMIS) Bilateral 06/25/2021   Procedure: BREAST RECONSTRUCTION WITH PLACEMENT OF TISSUE EXPANDER AND FLEX HD (ACELLULAR HYDRATED DERMIS);  Surgeon: Wallace Going, DO;  Location: [facility];  Service: Plastics;  Laterality: Bilateral;   PORTACATH PLACEMENT Right 12/13/2020   Procedure: INSERTION PORT-A-CATH;  Surgeon: Herbert Pun, MD;  Location: [facility];  Service: General;  Laterality: Right;   REMOVAL OF BILATERAL TISSUE  EXPANDERS WITH PLACEMENT OF BILATERAL BREAST IMPLANTS Bilateral 09/15/2021   Procedure: REMOVAL OF BILATERAL TISSUE EXPANDERS;  Surgeon: Wallace Going, DO;  Location: [place];  Service: Plastics;  Laterality: Bilateral;   Patient Active Problem List   Diagnosis Date Noted   Chemotherapy-induced neuropathy ([place]) 02/06/2022   Right sided weakness 02/06/2022   Transaminitis 11/20/2021   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 10/25/2021   Malignant neoplasm of left breast in female, estrogen receptor positive ([place]) 06/25/2021   Genetic testing 01/15/2021   Family history of cancer    Invasive carcinoma of breast ([place]) 12/03/2020   Breast mass, left 07/01/2020   Situational mixed anxiety and depressive disorder 07/01/2020   Anxiety state 08/06/2014   Varicose veins of lower extremity 08/06/2014    PCP: Juluis Pitch, MD   REFERRING PROVIDER: Sindy Guadeloupe, MD  REFERRING DIAG: 197.2  THERAPY DIAG: Post-mastectomy lymphedema syndrome  ONSET DATE: 9/22  SUBJECTIVE  SUBJECTIVE STATEMENT: Vickie Mathis presents to OT to address LUE/LUQ post-mastectomy lymphedema. Pt reports 2-4/10 pain in L lateral trunk is unchanged since commencing CDT.   PERTINENT HISTORY: 2021 self palpated L breast mass. Diagnostic mammogram and ultrasound revealed 2.7 cm mass at 12 o'clock 6 cm from the nipple. L axillary ultrasound revealed 2 suspicious LN. Both breast mass and LN + for invasive mammary carcinoma grade 3, ER/PR and her 2 negative.R breast non-mass enhancement was negative for Br Ca.Pt underwent neoadjuvant chemotherapy. 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 She underwent bilateral mastectomy with reconstruction in 9/22.Pathology revealed  complete pathological response with 2 LN negative and R breast negative for malignancy. Breast implants removed 2/2 staff infection. Anxiety, chemo-induced neuropathy, intermittent L chest wall pain,   IMPAIRMENTS: chronic pain L chest wall and lateral trunk s/p bilateral mastectomy and ALND, fatigue, altered sensation 2/2 chemo-induced neuropathy   FUNCTIONAL LIMITATIONS: pain at end ranges BUE shoulders, R>L; decreased hand function, limited participation in productive activities and work while recovering from cancer treatment.  OTHER OBSTACLES TO OT LE CARE: none identified at initial eval  PAIN:  Are you having pain? Yes NPRS scale: 4/10 Pain location: L lateral trunk and chest at mastectomy site PAIN TYPE: chronic Pain description: intermittent , sore, tender, tight numb Aggravating factors: lifting, carrying, pushing, pulling, reaching overhead Relieving factors: unknown   PRECAUTIONS: Other: lymphedema precautions   PATIENT GOALS: relieve post-mastectomy pain and limit LE progression   OBJECTIVE BLE COMPARATIVE LIMB VOLUMETRICS:   09/02/22 INITIAL LUE  LANDMARK LEFT  09/02/22  L MPs 20.5 cm  RUE (excluding hand)  2910.9 ml     Limb Volume differential (LVD)  %  Volume change since initial %  Volume change overall %  (Blank rows = not tested) :   LANDMARK LEFT  10/15/12 (10 th VISIT)  L MPs 20.5 cm  RUE (excluding hand)  2910.9 ml     Limb Volume differential (LVD)  %  Volume change since initial LUE volume INCrease 8.3 % since initially measured on 09/02/22  Volume change overall %  (Blank rows = not tested) :   PATIENT SURVEYS:  FOTO functional outcome measure: Intake TBA OT Rx visit 1   LYMPHEDEMA LIFE IMPACT SCALE: (The extent to which lymphedema-related problems affected your life last week)  Intake score: 20.59%    TODAY'S TREATMENT:                                                                                                                                         Pt and family edu for LE self-care and progress towards all goals LUE/LUQ MLD as established with simultaneous skin care kinesio tape  to L lateral trunk and chest wall anchoring at contralateral spine and extending anteriorly across surgical scars   PATIENT EDUCATION: Continued Pt/ CG edu for lymphedema self  care home program throughout session. Topics include outcome of comparative limb volumetrics- starting limb volume differentials (LVDs), technology and gradient techniques used for short stretch, multilayer compression wrapping, simple self-MLD, therapeutic lymphatic pumping exercises, skin/nail care, LE precautions,. compression garment recommendations and specifications, wear and care schedule and compression garment donning / doffing w assistive devices. Discussed progress towards all OT goals since commencing CDT. All questions answered to the Pt's satisfaction. Good return.  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 2 x daily PRN 2.  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 Belisse compression bra ( 39 a/b) and a JoviPak double mastectomy pad for HOS to reduce fibrosis and facilitate improved lymphatic function to limit progression. 3. Daily skin care to sustain optimal hydration. Wash bites, scratches, blisters, etc with mild soap and water , apply antibacterial first aide cream and a band aide. 4. Simple self-Manual lymphatic drainage (MLD) PRN.  ASSESSMENT:  CLINICAL IMPRESSION:  Pt tolerated MLD to LUE/LUQ without increased pain today during remainder of session. Emphasis of manual  therapy , MLD and fibrosis techniques, on lateral trunk along ribs and adjacent to axilla. Pt continues to present with pain at lateral trunk and surgical sites; swelling has reduced at chest wall and lateral trunk near axillae. Cont as per POC to reduce sensory and physical symptoms.   OBJECTIVE IMPAIRMENTS: decreased knowledge of lymphedema ;  decreased knowledge of use of DME, decreased ROM, increased edema, impaired sensation, and pain with onset in   ACTIVITY LIMITATIONS: carrying, lifting, bed mobility, bathing, dressing, reach over head, and hygiene/grooming  PARTICIPATION LIMITATIONS: occupation  PERSONAL FACTORS:  Hx comorbid anxiety  is also affecting patient's functional outcome.   REHAB POTENTIAL: Good  CLINICAL DECISION MAKING: Stable/uncomplicated   GOALS: Goals reviewed with patient? Yes  LONG TERM GOALS: Target date: 11/22/21   Given this patient's Intake score of 20.59/100% on the Lymphedema Life Impact Scale (LLIS), patient will experience a reduction of at least 5% in her perceived level of functional impairment resulting from lymphedema to improve functional performance and quality of life (QOL). Baseline: 20.59%  Goal status: 10/07/22 PROGRESSING  2.  Given this patient's Intake score of TBA/100% on the functional outcomes FOTO tool, patient will experience an increase in function of 5 points  to improve basic and instrumental ADLs performance, including lymphedema self-care. Baseline:  TBA Goal status: 10/07/22 PROGRESSING  3.   Pt will demonstrate understanding of lymphedema precautions and prevention strategies with modified independence using a printed reference to identify at least 5 precautions and discussing how s/he may implement them into daily life to reduce risk of progression and to limit infection risk. Baseline: Max A Goal status:10/07/22 GOAL MET  4.  Pt will be able to don and doff prophylactic compression arm sleeve using assistive devices and extra  time (modified independent), and discuss wear and garment care recommendations  to limit lymphedema by issue date. Baseline: dependent Goal status: 09/15/22 GOAL MET  5.  Pt will demonstrate the ability to perform all lymphedema self-care home program components with printed reference sheets (modified independence) by DC to limit lymphedema progression Baseline: dependent Goal status: 10/07/22 PROGRESSING  6.  Pt will report 0/10 shoulder  pain with AROM in all planes at end ranges bilaterally against gravity by DC for optimal functional performance of basic and instrumental ADLs, work and productive activities, leisure pursuits and social participation, Baseline: 2/10 Goal status: 10/07/22 PROGRESSING  PLAN:  PT FREQUENCY: 1-2x/week  PT DURATION: 12 weeks and PRN  PLANNED INTERVENTIONS: Therapeutic exercises, Therapeutic activity, Neuromuscular re-education, Balance training, Gait training, Patient/Family education, Self Care, and Joint mobilization  PLAN FOR NEXT SESSION:  Cont LUE/LUQ MLD , scar massage and myofascial release. Cont L trunk and chest wall Kinesiotape as tolerated Continue teaching LE self care Assess BUE AROM Complete comparative limb volumetrics for LUE  Andrey Spearman, MS, OTR/L, CLT-LANA 10/29/22 9:31 AM

## 2022-10-30 ENCOUNTER — Inpatient Hospital Stay: Payer: Commercial Managed Care - HMO

## 2022-10-30 ENCOUNTER — Other Ambulatory Visit: Payer: Self-pay | Admitting: *Deleted

## 2022-10-30 DIAGNOSIS — R7401 Elevation of levels of liver transaminase levels: Secondary | ICD-10-CM

## 2022-10-30 DIAGNOSIS — Z95828 Presence of other vascular implants and grafts: Secondary | ICD-10-CM

## 2022-10-30 DIAGNOSIS — C50412 Malignant neoplasm of upper-outer quadrant of left female breast: Secondary | ICD-10-CM

## 2022-10-30 DIAGNOSIS — Z853 Personal history of malignant neoplasm of breast: Secondary | ICD-10-CM | POA: Diagnosis not present

## 2022-10-30 LAB — CBC WITH DIFFERENTIAL/PLATELET
Abs Immature Granulocytes: 0.03 10*3/uL (ref 0.00–0.07)
Basophils Absolute: 0 10*3/uL (ref 0.0–0.1)
Basophils Relative: 0 %
Eosinophils Absolute: 0.1 10*3/uL (ref 0.0–0.5)
Eosinophils Relative: 1 %
HCT: 38.2 % (ref 36.0–46.0)
Hemoglobin: 13.2 g/dL (ref 12.0–15.0)
Immature Granulocytes: 0 %
Lymphocytes Relative: 13 %
Lymphs Abs: 1.2 10*3/uL (ref 0.7–4.0)
MCH: 32.1 pg (ref 26.0–34.0)
MCHC: 34.6 g/dL (ref 30.0–36.0)
MCV: 92.9 fL (ref 80.0–100.0)
Monocytes Absolute: 0.8 10*3/uL (ref 0.1–1.0)
Monocytes Relative: 8 %
Neutro Abs: 7.2 10*3/uL (ref 1.7–7.7)
Neutrophils Relative %: 78 %
Platelets: 187 10*3/uL (ref 150–400)
RBC: 4.11 MIL/uL (ref 3.87–5.11)
RDW: 14.7 % (ref 11.5–15.5)
WBC: 9.3 10*3/uL (ref 4.0–10.5)
nRBC: 0 % (ref 0.0–0.2)

## 2022-10-30 LAB — COMPREHENSIVE METABOLIC PANEL
ALT: 75 U/L — ABNORMAL HIGH (ref 0–44)
AST: 79 U/L — ABNORMAL HIGH (ref 15–41)
Albumin: 4.3 g/dL (ref 3.5–5.0)
Alkaline Phosphatase: 81 U/L (ref 38–126)
Anion gap: 9 (ref 5–15)
BUN: 9 mg/dL (ref 6–20)
CO2: 21 mmol/L — ABNORMAL LOW (ref 22–32)
Calcium: 8.5 mg/dL — ABNORMAL LOW (ref 8.9–10.3)
Chloride: 102 mmol/L (ref 98–111)
Creatinine, Ser: 0.8 mg/dL (ref 0.44–1.00)
GFR, Estimated: >60 mL/min (ref >60)
Glucose, Bld: 106 mg/dL — ABNORMAL HIGH (ref 70–99)
Potassium: 3.4 mmol/L — ABNORMAL LOW (ref 3.5–5.1)
Sodium: 132 mmol/L — ABNORMAL LOW (ref 135–145)
Total Bilirubin: 0.5 mg/dL (ref 0.3–1.2)
Total Protein: 7.1 g/dL (ref 6.5–8.1)

## 2022-10-30 MED ORDER — SODIUM CHLORIDE 0.9% FLUSH
10.0000 mL | Freq: Once | INTRAVENOUS | Status: AC
  Administered 2022-10-30: 10 mL via INTRAVENOUS
  Filled 2022-10-30: qty 10

## 2022-10-30 MED ORDER — HEPARIN SOD (PORK) LOCK FLUSH 100 UNIT/ML IV SOLN
500.0000 [IU] | Freq: Once | INTRAVENOUS | Status: AC
  Administered 2022-10-30: 500 [IU] via INTRAVENOUS
  Filled 2022-10-30: qty 5

## 2022-10-30 NOTE — Progress Notes (Signed)
Survivorship Care Plan visit completed.  Treatment summary reviewed and given to patient.  ASCO answers booklet reviewed and given to patient.  CARE program and Cancer Transitions discussed with patient along with other resources cancer center offers to patients and caregivers.  Patient verbalized understanding. 

## 2022-11-01 ENCOUNTER — Other Ambulatory Visit: Payer: Self-pay | Admitting: *Deleted

## 2022-11-01 MED ORDER — LIDOCAINE-PRILOCAINE 2.5-2.5 % EX CREA: 1.0000 | TOPICAL_CREAM | CUTANEOUS | 2 refills | Status: DC

## 2022-11-02 ENCOUNTER — Ambulatory Visit: Payer: Commercial Managed Care - HMO | Admitting: Occupational Therapy

## 2022-11-04 ENCOUNTER — Telehealth: Payer: Self-pay | Admitting: *Deleted

## 2022-11-04 NOTE — Telephone Encounter (Signed)
Filled out the form and faxed it to [phone]. And it went through the fax transmission

## 2022-11-05 ENCOUNTER — Ambulatory Visit: Payer: Commercial Managed Care - HMO | Attending: Oncology | Admitting: Occupational Therapy

## 2022-11-05 DIAGNOSIS — I972 Postmastectomy lymphedema syndrome: Secondary | ICD-10-CM | POA: Insufficient documentation

## 2022-11-05 NOTE — Therapy (Signed)
OUTPATIENT OCCUPATIONAL THERAPY  TREATMENT NOTE [place] POST-MASTECTOMY LYMPHEDEMA  Patient Name: Vickie Mathis MRN: [id number] DOB:Oct 24, 1990, 32 y.o., female Today's Date: 11/05/2022  END OF SESSION:  OT End of Session - 11/05/22 1507     Visit Number 14    Number of Visits 36    Date for OT Re-Evaluation 11/22/22    OT Start Time 0300    OT Stop Time 0350    OT Time Calculation (min) 50 min    Activity Tolerance Patient tolerated treatment well;No increased pain    Behavior During Therapy WFL for tasks assessed/performed                   Past Medical History:  Diagnosis Date   Anxiety    Asthma    Breast cancer ([place]) 11/2020   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 AXILLARY NODE DISSECTION;  Surgeon: Herbert Pun, MD;  Location: [facility];  Service: General;  Laterality: Bilateral;  [place], 1.5 hours Cintron-Diaz 2.5 hours   BREAST BIOPSY Left 11/26/2020   vision 12:00 6cmfn [facility]   BREAST BIOPSY Left 11/26/2020   LN bx, hydro marker,  fragments of macrometastatic carcinoma   BREAST RECONSTRUCTION WITH PLACEMENT OF TISSUE EXPANDER AND FLEX HD (ACELLULAR HYDRATED DERMIS) Bilateral 06/25/2021   Procedure: BREAST RECONSTRUCTION WITH PLACEMENT OF TISSUE EXPANDER AND FLEX HD (ACELLULAR HYDRATED DERMIS);  Surgeon: Wallace Going, DO;  Location: [facility];  Service: Plastics;  Laterality: Bilateral;   PORTACATH PLACEMENT Right 12/13/2020   Procedure: INSERTION PORT-A-CATH;  Surgeon: Herbert Pun, MD;  Location: [facility];  Service: General;  Laterality: Right;   REMOVAL OF BILATERAL TISSUE  EXPANDERS WITH PLACEMENT OF BILATERAL BREAST IMPLANTS Bilateral 09/15/2021   Procedure: REMOVAL OF BILATERAL TISSUE EXPANDERS;  Surgeon: Wallace Going, DO;  Location: [place];  Service: Plastics;  Laterality: Bilateral;   Patient Active Problem List   Diagnosis Date Noted   Chemotherapy-induced neuropathy ([place]) 02/06/2022   Right sided weakness 02/06/2022   Transaminitis 11/20/2021   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 10/25/2021   Malignant neoplasm of left breast in female, estrogen receptor positive ([place]) 06/25/2021   Genetic testing 01/15/2021   Family history of cancer    Invasive carcinoma of breast ([place]) 12/03/2020   Breast mass, left 07/01/2020   Situational mixed anxiety and depressive disorder 07/01/2020   Anxiety state 08/06/2014   Varicose veins of lower extremity 08/06/2014    PCP: Juluis Pitch, MD   REFERRING PROVIDER: Sindy Guadeloupe, MD  REFERRING DIAG: 197.2  THERAPY DIAG: Post-mastectomy lymphedema syndrome  ONSET DATE: 9/22  SUBJECTIVE  SUBJECTIVE STATEMENT: Vickie Mathis presents to OT to address LUE/LUQ post-mastectomy lymphedema. Pt reports 2-4/10 pain in L lateral trunk is unchanged since commencing CDT. Pt states  return to work on light duty at the [employer] is going well. She affirms that she is wearing prophylactic arm sleeve for repetitive work.OT checked on status of compression bras and Jovi Paks ordered on 09/07/22     from DME vendor after session.  PERTINENT HISTORY: 2021 self palpated L breast mass. Diagnostic mammogram and ultrasound revealed 2.7 cm mass at 12 o'clock 6 cm from the nipple. L axillary ultrasound revealed 2 suspicious LN. Both breast mass and LN + for invasive mammary carcinoma grade 3, ER/PR and her 2 negative.R breast  non-mass enhancement was negative for Br Ca.Pt underwent neoadjuvant chemotherapy. 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 She underwent bilateral mastectomy with reconstruction in 9/22.Pathology revealed complete pathological response with 2 LN negative and R breast negative for malignancy. Breast implants removed 2/2 staff infection. Anxiety, chemo-induced neuropathy, intermittent L chest wall pain,   IMPAIRMENTS: chronic pain L chest wall and lateral trunk s/p bilateral mastectomy and ALND, fatigue, altered sensation 2/2 chemo-induced neuropathy   FUNCTIONAL LIMITATIONS: pain at end ranges BUE shoulders, R>L; decreased hand function, limited participation in productive activities and work while recovering from cancer treatment.  OTHER OBSTACLES TO OT LE CARE: none identified at initial eval  PAIN:  Are you having pain? Yes NPRS scale: 2-4/10 Pain location: L lateral trunk and chest at mastectomy site PAIN TYPE: chronic Pain description: intermittent , sore, tender, tight numb Aggravating factors: lifting, carrying, pushing, pulling, reaching overhead Relieving factors: unknown   PRECAUTIONS: Other: lymphedema precautions   PATIENT GOALS: relieve post-mastectomy pain and limit LE progression   OBJECTIVE BLE COMPARATIVE LIMB VOLUMETRICS:   09/02/22 INITIAL LUE  LANDMARK LEFT  09/02/22  L MPs 20.5 cm  RUE (excluding hand)  2910.9 ml     Limb Volume differential (LVD)  %  Volume change since initial %  Volume change overall %  (Blank rows = not tested) :   LANDMARK LEFT  10/15/12 (10 th VISIT)  L MPs 20.5 cm  RUE (excluding hand)  2910.9 ml     Limb Volume differential (LVD)  %  Volume change since initial LUE volume INCrease 8.3 % since initially measured on 09/02/22  Volume change overall %  (Blank rows = not tested) :   PATIENT SURVEYS:  FOTO functional outcome measure: Intake TBA OT Rx visit 1   LYMPHEDEMA LIFE IMPACT  SCALE: (The extent to which lymphedema-related problems affected your life last week)  Intake score: 20.59%    TODAY'S TREATMENT:                                                                                                                                        Pt and family edu  for LE self-care and progress towards all goals LUE/LUQ MLD as established with simultaneous skin care  NO KINESIOTAPE TODAY due to time constraints. (Kinesio tape  to L lateral trunk and chest wall anchoring at contralateral spine and extending anteriorly across surgical scars)   PATIENT EDUCATION: Continued Pt/ CG edu for lymphedema self care home program throughout session. Topics include outcome of comparative limb volumetrics- starting limb volume differentials (LVDs), technology and gradient techniques used for short stretch, multilayer compression wrapping, simple self-MLD, therapeutic lymphatic pumping exercises, skin/nail care, LE precautions,. compression garment recommendations and specifications, wear and care schedule and compression garment donning / doffing w assistive devices. Discussed progress towards all OT goals since commencing CDT. All questions answered to the Pt's satisfaction. Good return.  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 2 x daily PRN 2.  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 Belisse compression bra ( 39 a/b) and a JoviPak double mastectomy pad for HOS to reduce fibrosis and facilitate improved lymphatic function to limit progression. 3. Daily skin care to sustain optimal hydration. Wash bites,  scratches, blisters, etc with mild soap and water , apply antibacterial first aide cream and a band aide. 4. Simple self-Manual lymphatic drainage (MLD) PRN.  ASSESSMENT:  CLINICAL IMPRESSION:  Limb swelling is stable. Pt continues to have discomfort at surgical  site   s and lateral trunk and axillae. Pt tolerated MLD to LUE/LUQ without increased pain today during remainder of session. Checked status of compression bras and convoluted pads after session. They have not arrived from DME vendor despite ordering 09/07/22.  Cont as per POC to reduce sensory and physical symptoms.   OBJECTIVE IMPAIRMENTS: decreased knowledge of lymphedema ;  decreased knowledge of use of DME, decreased ROM, increased edema, impaired sensation, and pain with onset in   ACTIVITY LIMITATIONS: carrying, lifting, bed mobility, bathing, dressing, reach over head, and hygiene/grooming  PARTICIPATION LIMITATIONS: occupation  PERSONAL FACTORS:  Hx comorbid anxiety  is also affecting patient's functional outcome.   REHAB POTENTIAL: Good  CLINICAL DECISION MAKING: Stable/uncomplicated   GOALS: Goals reviewed with patient? Yes  LONG TERM GOALS: Target date: 11/22/21   Given this patient's Intake score of 20.59/100% on the Lymphedema Life Impact Scale (LLIS), patient will experience a reduction of at least 5% in her perceived level of functional impairment resulting from lymphedema to improve functional performance and quality of life (QOL). Baseline: 20.59%  Goal status: 10/07/22 PROGRESSING  2.  Given this patient's Intake score of TBA/100% on the functional outcomes FOTO tool, patient will experience an increase in function of 5 points  to improve basic and instrumental ADLs performance, including lymphedema self-care. Baseline:  TBA Goal status: 10/07/22 PROGRESSING  3.   Pt will demonstrate understanding of lymphedema precautions and prevention strategies with modified independence using a printed reference to  identify at least 5 precautions and discussing how s/he may implement them into daily life to reduce risk of progression and to limit infection risk. Baseline: Max A Goal status:10/07/22 GOAL MET  4.  Pt will be able to don and doff prophylactic compression arm sleeve using assistive devices and extra time (modified independent), and discuss wear and garment care recommendations  to limit lymphedema by issue date. Baseline: dependent Goal status: 09/15/22 GOAL MET  5.  Pt will demonstrate the ability to perform all lymphedema self-care home program components with printed reference sheets (modified independence) by DC to limit lymphedema progression Baseline: dependent Goal status: 10/07/22 PROGRESSING  6.  Pt will report 0/10 shoulder pain with AROM in all planes at end ranges bilaterally against gravity by DC for optimal functional performance of basic and instrumental ADLs, work and productive activities, leisure pursuits and social participation, Baseline: 2/10 Goal status: 10/07/22 PROGRESSING  PLAN:  PT FREQUENCY: 1-2x/week  PT DURATION: 12 weeks and PRN  PLANNED INTERVENTIONS: Therapeutic exercises, Therapeutic activity, Neuromuscular re-education, Balance training, Gait training, Patient/Family education, Self Care, and Joint mobilization  PLAN FOR NEXT SESSION:  Cont LUE/LUQ MLD , scar massage and myofascial release. Cont L trunk and chest wall Kinesiotape as tolerated Continue teaching LE self care Assess BUE AROM Complete comparative limb volumetrics for LUE  Andrey Spearman, MS, OTR/L, CLT-LANA 11/05/22 3:57 PM

## 2022-11-09 ENCOUNTER — Ambulatory Visit: Payer: Commercial Managed Care - HMO | Admitting: Occupational Therapy

## 2022-11-09 DIAGNOSIS — I972 Postmastectomy lymphedema syndrome: Secondary | ICD-10-CM | POA: Diagnosis not present

## 2022-11-09 NOTE — Therapy (Signed)
OUTPATIENT OCCUPATIONAL THERAPY  TREATMENT NOTE [place] POST-MASTECTOMY LYMPHEDEMA  Patient Name: Alaia Lordi MRN: [id number] DOB:11/07/1990, 32 y.o., female Today's Date: 11/09/2022  END OF SESSION:  OT End of Session - 11/09/22 1320     Visit Number 15    Number of Visits 36    Date for OT Re-Evaluation 11/22/22    OT Start Time 0104    OT Stop Time 0204    OT Time Calculation (min) 60 min    Activity Tolerance Patient tolerated treatment well;No increased pain    Behavior During Therapy WFL for tasks assessed/performed                   Past Medical History:  Diagnosis Date   Anxiety    Asthma    Breast cancer ([place]) 11/2020   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 AXILLARY NODE DISSECTION;  Surgeon: Herbert Pun, MD;  Location: [facility] ORS;  Service: General;  Laterality: Bilateral;  [place], 1.5 hours Cintron-Diaz 2.5 hours   BREAST BIOPSY Left 11/26/2020   vision 12:00 6cmfn [facility]   BREAST BIOPSY Left 11/26/2020   LN bx, hydro marker,  fragments of macrometastatic carcinoma   BREAST RECONSTRUCTION WITH PLACEMENT OF TISSUE EXPANDER AND FLEX HD (ACELLULAR HYDRATED DERMIS) Bilateral 06/25/2021   Procedure: BREAST RECONSTRUCTION WITH PLACEMENT OF TISSUE EXPANDER AND FLEX HD (ACELLULAR HYDRATED DERMIS);  Surgeon: Wallace Going, DO;  Location: [facility] ORS;  Service: Plastics;  Laterality: Bilateral;   PORTACATH PLACEMENT Right 12/13/2020   Procedure: INSERTION PORT-A-CATH;  Surgeon: Herbert Pun, MD;  Location: [facility] ORS;  Service: General;  Laterality: Right;   REMOVAL OF BILATERAL TISSUE  EXPANDERS WITH PLACEMENT OF BILATERAL BREAST IMPLANTS Bilateral 09/15/2021   Procedure: REMOVAL OF BILATERAL TISSUE EXPANDERS;  Surgeon: Wallace Going, DO;  Location: 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 10/25/2021   Malignant neoplasm of left breast in female, estrogen receptor positive ([place]) 06/25/2021   Genetic testing 01/15/2021   Family history of cancer    Invasive carcinoma of breast ([place]) 12/03/2020   Breast mass, left 07/01/2020   Situational mixed anxiety and depressive disorder 07/01/2020   Anxiety state 08/06/2014   Varicose veins of lower extremity 08/06/2014    PCP: Juluis Pitch, MD   REFERRING PROVIDER: Sindy Guadeloupe, MD  REFERRING DIAG: 197.2  THERAPY DIAG: Post-mastectomy lymphedema syndrome  ONSET DATE: 9/22  SUBJECTIVE  SUBJECTIVE STATEMENT: Liandra Mendia presents to OT to address LUE/LUQ post-mastectomy lymphedema. Pt reports ongoing pain in L lateral trunk , shoulder and neck. Pt does not rate pain   in the LUQ numerically. Pt states  return to work on light duty  and not taxing. She affirms that she is wearing prophylactic arm sleeve for repetitive work.  PERTINENT HISTORY: 2021 self palpated L breast mass. Diagnostic mammogram and ultrasound revealed 2.7 cm mass at 12 o'clock 6 cm from the nipple. L axillary ultrasound revealed 2 suspicious LN. Both breast mass and LN + for invasive mammary carcinoma grade 3, ER/PR and her 2 negative.R breast non-mass enhancement was negative for Br Ca.Pt underwent neoadjuvant chemotherapy.  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 She underwent bilateral mastectomy with reconstruction in 9/22.Pathology revealed complete pathological response with 2 LN negative and R breast negative for malignancy. Breast implants removed 2/2 staff infection. Anxiety, chemo-induced neuropathy, intermittent L chest wall pain,   IMPAIRMENTS: chronic pain L chest wall and lateral trunk s/P bilateral mastectomy and ALND, fatigue, altered sensation 2/2 chemo-induced neuropathy   FUNCTIONAL LIMITATIONS: pain at end ranges BUE shoulders, R>L; decreased hand function, limited participation in productive activities and work while recovering from cancer treatment.  OTHER OBSTACLES TO OT LE CARE: none identified at initial eval  PAIN:  Are you having pain? Yes NPRS scale: 2-4/10 Pain location: L lateral trunk and chest at mastectomy site PAIN TYPE: chronic Pain description: intermittent , sore, tender, tight numb Aggravating factors: lifting, carrying, pushing, pulling, reaching overhead Relieving factors: unknown   PRECAUTIONS: Other: lymphedema precautions   PATIENT GOALS: relieve post-mastectomy pain and limit LE progression   OBJECTIVE BLE COMPARATIVE LIMB VOLUMETRICS:   09/02/22 INITIAL LUE  LANDMARK LEFT  09/02/22  L MPs 20.5 cm  RUE (excluding hand)  2910.9 ml     Limb Volume differential (LVD)  %  Volume change since initial %  Volume change overall %  (Blank rows = not tested) :   LANDMARK LEFT  10/15/12 (10 th VISIT)  L MPs 20.5 cm  RUE (excluding hand)  2910.9 ml     Limb Volume differential (LVD)  %  Volume change since initial LUE volume Increase 8.3 % since initially measured on 09/02/22  Volume change overall %  (Blank rows = not tested) :   PATIENT SURVEYS:  FOTO functional outcome measure: Intake TBA OT Rx visit 1   LYMPHEDEMA LIFE IMPACT SCALE: (The extent to which lymphedema-related problems affected your life last  week)  Intake score: 20.59%    TODAY'S TREATMENT:                                                                                                                                        Pt and family edu for LE self-care and progress towards all goals LUE/LUQ MLD as established with  simultaneous skin care  NO KINESIOTAPE TODAY due to time constraints. (Kinesio tape  to L lateral trunk and chest wall anchoring at contralateral spine and extending anteriorly across surgical scars)   PATIENT EDUCATION: Continued Pt/ CG edu for lymphedema self care home program throughout session. Topics include outcome of comparative limb volumetrics- starting limb volume differentials (LVDs), technology and gradient techniques used for short stretch, multilayer compression wrapping, simple self-MLD, therapeutic lymphatic pumping exercises, skin/nail care, LE precautions,. compression garment recommendations and specifications, wear and care schedule and compression garment donning / doffing w assistive devices. Discussed progress towards all OT goals since commencing CDT. All questions answered to the Pt's satisfaction. Good return.  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 2 x daily PRN 2.  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 Belisse compression bra ( 39 a/b) and a JoviPak double mastectomy pad for HOS to reduce fibrosis and facilitate improved lymphatic function to limit progression. 3. Daily skin care to sustain optimal hydration. Wash bites, scratches, blisters, etc with mild soap and water , apply antibacterial first  aide cream and a band aide. 4. Simple self-Manual lymphatic drainage (MLD) PRN.  ASSESSMENT:  CLINICAL IMPRESSION:  Emphasis of OT manual therapy on myofascial release and scar massage to L chest while Pt performing sustained pectoralis major stretch while supine on Rx bed. Pt tolerated sustained stretch with minimal pain. Cont as per POC.   OBJECTIVE IMPAIRMENTS: decreased knowledge of lymphedema ;  decreased knowledge of use of DME, decreased ROM, increased edema, impaired sensation, and pain with onset in   ACTIVITY LIMITATIONS: carrying, lifting, bed mobility, bathing, dressing, reach over head, and hygiene/grooming  PARTICIPATION LIMITATIONS: occupation  PERSONAL FACTORS:  Hx comorbid anxiety  is also affecting patient's functional outcome.   REHAB POTENTIAL: Good  CLINICAL DECISION MAKING: Stable/uncomplicated   GOALS: Goals reviewed with patient? Yes  LONG TERM GOALS: Target date: 11/22/21   Given this patient's Intake score of 20.59/100% on the Lymphedema Life Impact Scale (LLIS), patient will experience a reduction of at least 5% in her perceived level of functional impairment resulting from lymphedema to improve functional performance and quality of life (QOL). Baseline: 20.59%  Goal status: 10/07/22 PROGRESSING  2.  Given this patient's Intake score of TBA/100% on the functional outcomes FOTO tool, patient will experience an increase in function of 5 points  to improve basic and instrumental ADLs performance, including lymphedema self-care. Baseline:  TBA Goal status: 10/07/22 PROGRESSING  3.   Pt will demonstrate understanding of lymphedema precautions and prevention strategies with modified independence using a printed reference to identify at least 5 precautions and discussing how s/he may implement them into daily life to reduce risk of progression and to limit infection risk. Baseline: Max A Goal status:10/07/22 GOAL MET  4.  Pt will be able to don and doff  prophylactic compression arm sleeve using assistive devices and extra time (modified independent), and discuss wear and garment care recommendations  to limit lymphedema by issue date. Baseline: dependent Goal status: 09/15/22 GOAL MET  5.  Pt will demonstrate the ability to perform all lymphedema self-care home program components with printed reference sheets (modified independence) by DC to limit lymphedema  progression Baseline: dependent Goal status: 10/07/22 PROGRESSING  6.  Pt will report 0/10 shoulder pain with AROM in all planes at end ranges bilaterally against gravity by DC for optimal functional performance of basic and instrumental ADLs, work and productive activities, leisure pursuits and social participation, Baseline: 2/10 Goal status: 10/07/22 PROGRESSING  PLAN:  PT FREQUENCY: 1-2x/week  PT DURATION: 12 weeks and PRN  PLANNED INTERVENTIONS: Therapeutic exercises, Therapeutic activity, Neuromuscular re-education, Balance training, Gait training, Patient/Family education, Self Care, and Joint mobilization  PLAN FOR NEXT SESSION:  Cont LUE/LUQ MLD , scar massage and myofascial release. Cont L trunk and chest wall Kinesiotape as tolerated Continue teaching LE self care Assess BUE AROM Complete comparative limb volumetrics for LUE  Andrey Spearman, MS, OTR/L, CLT-LANA 11/09/22 2:11 PM

## 2022-11-12 ENCOUNTER — Ambulatory Visit: Payer: Commercial Managed Care - HMO | Admitting: Occupational Therapy

## 2022-11-12 ENCOUNTER — Encounter: Payer: Self-pay | Admitting: Oncology

## 2022-11-13 ENCOUNTER — Inpatient Hospital Stay: Payer: Commercial Managed Care - HMO | Attending: Oncology

## 2022-11-13 ENCOUNTER — Other Ambulatory Visit: Payer: Self-pay | Admitting: *Deleted

## 2022-11-13 ENCOUNTER — Inpatient Hospital Stay: Payer: Commercial Managed Care - HMO

## 2022-11-13 DIAGNOSIS — Z853 Personal history of malignant neoplasm of breast: Secondary | ICD-10-CM | POA: Insufficient documentation

## 2022-11-13 DIAGNOSIS — R7401 Elevation of levels of liver transaminase levels: Secondary | ICD-10-CM

## 2022-11-13 DIAGNOSIS — C50919 Malignant neoplasm of unspecified site of unspecified female breast: Secondary | ICD-10-CM

## 2022-11-13 DIAGNOSIS — S0010XA Contusion of unspecified eyelid and periocular area, initial encounter: Secondary | ICD-10-CM

## 2022-11-13 DIAGNOSIS — Z171 Estrogen receptor negative status [ER-]: Secondary | ICD-10-CM

## 2022-11-13 DIAGNOSIS — K754 Autoimmune hepatitis: Secondary | ICD-10-CM | POA: Insufficient documentation

## 2022-11-13 DIAGNOSIS — Z9181 History of falling: Secondary | ICD-10-CM

## 2022-11-13 LAB — CBC WITH DIFFERENTIAL/PLATELET
Abs Immature Granulocytes: 0.02 10*3/uL (ref 0.00–0.07)
Basophils Absolute: 0 10*3/uL (ref 0.0–0.1)
Basophils Relative: 0 %
Eosinophils Absolute: 0.1 10*3/uL (ref 0.0–0.5)
Eosinophils Relative: 2 %
HCT: 37.6 % (ref 36.0–46.0)
Hemoglobin: 13.1 g/dL (ref 12.0–15.0)
Immature Granulocytes: 0 %
Lymphocytes Relative: 16 %
Lymphs Abs: 1.1 10*3/uL (ref 0.7–4.0)
MCH: 32.7 pg (ref 26.0–34.0)
MCHC: 34.8 g/dL (ref 30.0–36.0)
MCV: 93.8 fL (ref 80.0–100.0)
Monocytes Absolute: 0.6 10*3/uL (ref 0.1–1.0)
Monocytes Relative: 9 %
Neutro Abs: 5 10*3/uL (ref 1.7–7.7)
Neutrophils Relative %: 73 %
Platelets: 182 10*3/uL (ref 150–400)
RBC: 4.01 MIL/uL (ref 3.87–5.11)
RDW: 13.6 % (ref 11.5–15.5)
WBC: 6.8 10*3/uL (ref 4.0–10.5)
nRBC: 0 % (ref 0.0–0.2)

## 2022-11-13 LAB — COMPREHENSIVE METABOLIC PANEL
ALT: 94 U/L — ABNORMAL HIGH (ref 0–44)
AST: 89 U/L — ABNORMAL HIGH (ref 15–41)
Albumin: 4.5 g/dL (ref 3.5–5.0)
Alkaline Phosphatase: 106 U/L (ref 38–126)
Anion gap: 9 (ref 5–15)
BUN: 14 mg/dL (ref 6–20)
CO2: 24 mmol/L (ref 22–32)
Calcium: 8.9 mg/dL (ref 8.9–10.3)
Chloride: 101 mmol/L (ref 98–111)
Creatinine, Ser: 0.67 mg/dL (ref 0.44–1.00)
GFR, Estimated: >60 mL/min (ref >60)
Glucose, Bld: 93 mg/dL (ref 70–99)
Potassium: 4 mmol/L (ref 3.5–5.1)
Sodium: 134 mmol/L — ABNORMAL LOW (ref 135–145)
Total Bilirubin: 0.5 mg/dL (ref 0.3–1.2)
Total Protein: 7.7 g/dL (ref 6.5–8.1)

## 2022-11-13 MED ORDER — HEPARIN SOD (PORK) LOCK FLUSH 100 UNIT/ML IV SOLN
500.0000 [IU] | Freq: Once | INTRAVENOUS | Status: AC
  Administered 2022-11-13: 500 [IU] via INTRAVENOUS
  Filled 2022-11-13: qty 5

## 2022-11-13 MED ORDER — SODIUM CHLORIDE 0.9% FLUSH
10.0000 mL | INTRAVENOUS | Status: DC | PRN
  Administered 2022-11-13: 10 mL via INTRAVENOUS
  Filled 2022-11-13: qty 10

## 2022-11-13 NOTE — Progress Notes (Signed)
Pt reports "2 days ago, I was a little tipsy and fell" Pt reports that the fall occurred in the shower. Right eye and cheek appears bruised. Pt reports that since the fall occurred she has been "foggy" and experiencing a constant "dull headache". Dr. Janese Banks made aware.  Per Dr. Janese Banks pt to report to ER if symptoms worsen. Dr. Janese Banks offered to order head CT, pt declines head Ct at this moment stating she would like to "talk to my husband first" and pt will message Dr. Elroy Channel team if pt would like to have head CT.  Pt educated to report to ER if symptoms worsens. Pt educated to call clinic or PCP if any questions or concerns arise or symptoms persist. Pt verbalizes understanding.  Pt stable at discharge and declines any concerns at this time.

## 2022-11-17 ENCOUNTER — Ambulatory Visit: Payer: Commercial Managed Care - HMO | Admitting: Occupational Therapy

## 2022-11-19 ENCOUNTER — Ambulatory Visit: Payer: Commercial Managed Care - HMO | Admitting: Occupational Therapy

## 2022-11-24 ENCOUNTER — Other Ambulatory Visit: Payer: Self-pay | Admitting: *Deleted

## 2022-11-24 ENCOUNTER — Encounter: Payer: Self-pay | Admitting: *Deleted

## 2022-11-24 ENCOUNTER — Ambulatory Visit: Payer: Commercial Managed Care - HMO | Admitting: Occupational Therapy

## 2022-11-24 ENCOUNTER — Inpatient Hospital Stay: Payer: Commercial Managed Care - HMO | Admitting: Licensed Clinical Social Worker

## 2022-11-24 DIAGNOSIS — C50919 Malignant neoplasm of unspecified site of unspecified female breast: Secondary | ICD-10-CM

## 2022-11-24 DIAGNOSIS — F419 Anxiety disorder, unspecified: Secondary | ICD-10-CM

## 2022-11-24 DIAGNOSIS — I972 Postmastectomy lymphedema syndrome: Secondary | ICD-10-CM

## 2022-11-24 DIAGNOSIS — F411 Generalized anxiety disorder: Secondary | ICD-10-CM

## 2022-11-24 DIAGNOSIS — C50012 Malignant neoplasm of nipple and areola, left female breast: Secondary | ICD-10-CM

## 2022-11-24 NOTE — Therapy (Signed)
OUTPATIENT OCCUPATIONAL THERAPY  TREATMENT NOTE [place] POST-MASTECTOMY LYMPHEDEMA  Patient Name: Vickie Mathis MRN: [id number] DOB:17-Jul-1991, 32 y.o., female Today's Date: 11/24/2022  END OF SESSION:  OT End of Session - 11/24/22 1317     Visit Number 16    Number of Visits 36    Date for OT Re-Evaluation 11/22/22    OT Start Time 0105    OT Stop Time 0205    OT Time Calculation (min) 60 min    Activity Tolerance Patient tolerated treatment well;No increased pain    Behavior During Therapy WFL for tasks assessed/performed                   Past Medical History:  Diagnosis Date   Anxiety    Asthma    Breast cancer ([place]) 11/2020   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 AXILLARY NODE DISSECTION;  Surgeon: Herbert Pun, MD;  Location: [facility];  Service: General;  Laterality: Bilateral;  [place], 1.5 hours Cintron-Diaz 2.5 hours   BREAST BIOPSY Left 11/26/2020   vision 12:00 6cmfn [facility]   BREAST BIOPSY Left 11/26/2020   LN bx, hydro marker,  fragments of macrometastatic carcinoma   BREAST RECONSTRUCTION WITH PLACEMENT OF TISSUE EXPANDER AND FLEX HD (ACELLULAR HYDRATED DERMIS) Bilateral 06/25/2021   Procedure: BREAST RECONSTRUCTION WITH PLACEMENT OF TISSUE EXPANDER AND FLEX HD (ACELLULAR HYDRATED DERMIS);  Surgeon: Wallace Going, DO;  Location: [facility];  Service: Plastics;  Laterality: Bilateral;   PORTACATH PLACEMENT Right 12/13/2020   Procedure: INSERTION PORT-A-CATH;  Surgeon: Herbert Pun, MD;  Location: [facility];  Service: General;  Laterality: Right;   REMOVAL OF BILATERAL TISSUE  EXPANDERS WITH PLACEMENT OF BILATERAL BREAST IMPLANTS Bilateral 09/15/2021   Procedure: REMOVAL OF BILATERAL TISSUE EXPANDERS;  Surgeon: Wallace Going, DO;  Location: [facility];  Service: Plastics;  Laterality: Bilateral;   Patient Active Problem List   Diagnosis Date Noted   Chemotherapy-induced neuropathy ([place]) 02/06/2022   Right sided weakness 02/06/2022   Transaminitis 11/20/2021   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 10/25/2021   Malignant neoplasm of left breast in female, estrogen receptor positive ([place]) 06/25/2021   Genetic testing 01/15/2021   Family history of cancer    Invasive carcinoma of breast ([place]) 12/03/2020   Breast mass, left 07/01/2020   Situational mixed anxiety and depressive disorder 07/01/2020   Anxiety state 08/06/2014   Varicose veins of lower extremity 08/06/2014    PCP: Juluis Pitch, MD   REFERRING PROVIDER: Sindy Guadeloupe, MD  REFERRING DIAG: 197.2  THERAPY DIAG: Postmastectomy lymphedema syndrome  ONSET DATE: 9/22  SUBJECTIVE  SUBJECTIVE STATEMENT: Vickie Mathis presents to OT to address LUE/LUQ post-mastectomy lymphedema. Pt reports ongoing pain in L lateral trunk , shoulder and neck. Pt does not rate pain   in the LUQ numerically. Pt states she is looking for a way to restart exercise routine. She states she will need to get stronger to resume her typical job duties.   PERTINENT HISTORY: 2021 self palpated L breast mass. Diagnostic mammogram and ultrasound revealed 2.7 cm mass at 12 o'clock 6 cm from the nipple. L axillary ultrasound revealed 2 suspicious LN. Both breast mass and LN + for invasive mammary carcinoma grade 3, ER/PR and her 2 negative.R breast non-mass enhancement was negative for Br Ca.Pt underwent neoadjuvant  chemotherapy. 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 She underwent bilateral mastectomy with reconstruction in 9/22.Pathology revealed complete pathological response with 2 LN negative and R breast negative for malignancy. Breast implants removed 2/2 staff infection. Anxiety, chemo-induced neuropathy, intermittent L chest wall pain,   IMPAIRMENTS: chronic pain L chest wall and lateral trunk s/P bilateral mastectomy and ALND, fatigue, altered sensation 2/2 chemo-induced neuropathy   FUNCTIONAL LIMITATIONS: pain at end ranges BUE shoulders, R>L; decreased hand function, limited participation in productive activities and work while recovering from cancer treatment.  OTHER OBSTACLES TO OT LE CARE: none identified at initial eval  PAIN:  Are you having pain? Yes NPRS scale: 2-4/10 Pain location: L lateral trunk and chest at mastectomy site PAIN TYPE: chronic Pain description: intermittent , sore, tender, tight numb Aggravating factors: lifting, carrying, pushing, pulling, reaching overhead Relieving factors: unknown   PRECAUTIONS: Other: lymphedema precautions   PATIENT GOALS: relieve post-mastectomy pain and limit LE progression   OBJECTIVE BLE COMPARATIVE LIMB VOLUMETRICS:   09/02/22 INITIAL LUE  LANDMARK LEFT  09/02/22  L MPs 20.5 cm  RUE (excluding hand)  2910.9 ml     Limb Volume differential (LVD)  %  Volume change since initial %  Volume change overall %  (Blank rows = not tested) :   LANDMARK LEFT  10/15/12 (10 th VISIT)  L MPs 20.5 cm  RUE (excluding hand)  2910.9 ml     Limb Volume differential (LVD)  %  Volume change since initial LUE volume Increase 8.3 % since initially measured on 09/02/22  Volume change overall %  (Blank rows = not tested) :   PATIENT SURVEYS:  FOTO functional outcome measure: Intake TBA OT Rx visit 1   LYMPHEDEMA LIFE IMPACT SCALE: (The extent to which lymphedema-related problems affected your  life last week)  Intake score: 20.59%    TODAY'S TREATMENT:                                                                                                                                        Pt and family edu for LE self-care and progress towards all goals LUE/LUQ MLD as  established with simultaneous skin care  NO KINESIOTAPE TODAY due to time constraints. (Kinesio tape  to L lateral trunk and chest wall anchoring at contralateral spine and extending anteriorly across surgical scars)   PATIENT EDUCATION: Continued Pt/ CG edu for lymphedema self care home program throughout session. Topics include outcome of comparative limb volumetrics- starting limb volume differentials (LVDs), technology and gradient techniques used for short stretch, multilayer compression wrapping, simple self-MLD, therapeutic lymphatic pumping exercises, skin/nail care, LE precautions,. compression garment recommendations and specifications, wear and care schedule and compression garment donning / doffing w assistive devices. Discussed progress towards all OT goals since commencing CDT. All questions answered to the Pt's satisfaction. Good return.  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 2 x daily PRN 2.  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 Belisse compression bra ( 39 a/b) and a JoviPak double mastectomy pad for HOS to reduce fibrosis and facilitate improved lymphatic function to limit progression. 3. Daily skin care to sustain optimal hydration. Wash bites, scratches, blisters, etc with mild soap and water , apply antibacterial  first aide cream and a band aide. 4. Simple self-Manual lymphatic drainage (MLD) PRN.  ASSESSMENT:  CLINICAL IMPRESSION:   OT emailed DME vendor to check status of Pt's compression bras and foam inserts before starting session to check status. I feel these garments would give some symptom relief and soften fibrosis during HOS.  Pt tolerated MLD in supine and side lying using AAA to functional R axillary LN and to AI in side lying. Pt c/o discomfort at lateral trunk with fibrosis technique to palpable fibrosis. CONT as per POC.   OBJECTIVE IMPAIRMENTS: decreased knowledge of lymphedema ;  decreased knowledge of use of DME, decreased ROM, increased edema, impaired sensation, and pain with onset in   ACTIVITY LIMITATIONS: carrying, lifting, bed mobility, bathing, dressing, reach over head, and hygiene/grooming  PARTICIPATION LIMITATIONS: occupation  PERSONAL FACTORS:  Hx comorbid anxiety  is also affecting patient's functional outcome.   REHAB POTENTIAL: Good  CLINICAL DECISION MAKING: Stable/uncomplicated   GOALS: Goals reviewed with patient? Yes  LONG TERM GOALS: Target date: 11/22/21   Given this patient's Intake score of 20.59/100% on the Lymphedema Life Impact Scale (LLIS), patient will experience a reduction of at least 5% in her perceived level of functional impairment resulting from lymphedema to improve functional performance and quality of life (QOL). Baseline: 20.59%  Goal status: 10/07/22 PROGRESSING  2.  Given this patient's Intake score of TBA/100% on the functional outcomes FOTO tool, patient will experience an increase in function of 5 points  to improve basic and instrumental ADLs performance, including lymphedema self-care. Baseline:  TBA Goal status: 10/07/22 PROGRESSING  3.   Pt will demonstrate understanding of lymphedema precautions and prevention strategies with modified independence using a printed reference to identify at least 5 precautions and discussing how s/he  may implement them into daily life to reduce risk of progression and to limit infection risk. Baseline: Max A Goal status:10/07/22 GOAL MET  4.  Pt will be able to don and doff prophylactic compression arm sleeve using assistive devices and extra time (modified independent), and discuss wear and garment care recommendations  to limit  lymphedema by issue date. Baseline: dependent Goal status: 09/15/22 GOAL MET  5.  Pt will demonstrate the ability to perform all lymphedema self-care home program components with printed reference sheets (modified independence) by DC to limit lymphedema progression Baseline: dependent Goal status: 10/07/22 PROGRESSING  6.  Pt will report 0/10 shoulder pain with AROM in all planes at end ranges bilaterally against gravity by DC for optimal functional performance of basic and instrumental ADLs, work and productive activities, leisure pursuits and social participation, Baseline: 2/10 Goal status: 10/07/22 PROGRESSING  PLAN:  PT FREQUENCY: 1-2x/week  PT DURATION: 12 weeks and PRN  PLANNED INTERVENTIONS: Therapeutic exercises, Therapeutic activity, Neuromuscular re-education, Balance training, Gait training, Patient/Family education, Self Care, and Joint mobilization  PLAN FOR NEXT SESSION:  Cont LUE/LUQ MLD , scar massage and myofascial release. Cont L trunk and chest wall Kinesiotape as tolerated Continue teaching LE self care Assess BUE AROM Complete comparative limb volumetrics for LUE  Andrey Spearman, MS, OTR/L, CLT-LANA 11/24/22 3:56 PM

## 2022-11-24 NOTE — Progress Notes (Signed)
[facility] CSW Progress Note  Clinical Education officer, museum contacted patient by phone to follow-up.  Patient was unable to speak with CSW and requested a call at another time.  CSW stated I would contact patient on Thursday.  Patient verbalized understanding. Patient did update CSW, she is still waiting for approval for disability.    Adelene Amas, LCSW

## 2022-11-25 ENCOUNTER — Ambulatory Visit: Payer: Commercial Managed Care - HMO

## 2022-11-26 ENCOUNTER — Ambulatory Visit: Payer: Commercial Managed Care - HMO | Admitting: Occupational Therapy

## 2022-11-26 DIAGNOSIS — I972 Postmastectomy lymphedema syndrome: Secondary | ICD-10-CM

## 2022-11-27 ENCOUNTER — Inpatient Hospital Stay: Payer: Commercial Managed Care - HMO

## 2022-11-27 NOTE — Therapy (Signed)
OUTPATIENT OCCUPATIONAL THERAPY  TREATMENT NOTE Vickie Mathis  Patient Name: Vickie Mathis MRN: [id number] DOB:1991-04-05, 32 y.o., female Today's Date: 11/27/2022  END OF SESSION:  OT End of Session - 11/26/22 1313     Visit Number 17    Number of Visits 36    Date for OT Re-Evaluation 02/22/23    OT Start Time 0105    OT Stop Time 0205    OT Time Calculation (min) 60 min    Activity Tolerance Patient tolerated treatment well;No increased pain    Behavior During Therapy WFL for tasks assessed/performed                   Past Medical History:  Diagnosis Date   Anxiety    Asthma    Breast cancer ([place]) 11/2020   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 AXILLARY NODE DISSECTION;  Surgeon: Herbert Pun, MD;  Location: [facility];  Service: General;  Laterality: Bilateral;  [place], 1.5 hours Cintron-Diaz 2.5 hours   BREAST BIOPSY Left 11/26/2020   vision 12:00 6cmfn [facility]   BREAST BIOPSY Left 11/26/2020   LN bx, hydro marker,  fragments of macrometastatic carcinoma   BREAST RECONSTRUCTION WITH PLACEMENT OF TISSUE EXPANDER AND FLEX HD (ACELLULAR HYDRATED DERMIS) Bilateral 06/25/2021   Procedure: BREAST RECONSTRUCTION WITH PLACEMENT OF TISSUE EXPANDER AND FLEX HD (ACELLULAR HYDRATED DERMIS);  Surgeon: Wallace Going, DO;  Location: [facility];  Service: Plastics;  Laterality: Bilateral;   PORTACATH PLACEMENT Right 12/13/2020   Procedure: INSERTION PORT-A-CATH;  Surgeon: Herbert Pun, MD;  Location: [facility];  Service: General;  Laterality: Right;   REMOVAL OF BILATERAL TISSUE  EXPANDERS WITH PLACEMENT OF BILATERAL BREAST IMPLANTS Bilateral 09/15/2021   Procedure: REMOVAL OF BILATERAL TISSUE EXPANDERS;  Surgeon: Wallace Going, DO;  Location: [place];  Service: Plastics;  Laterality: Bilateral;   Patient Active Problem List   Diagnosis Date Noted   Chemotherapy-induced neuropathy ([place]) 02/06/2022   Right sided weakness 02/06/2022   Transaminitis 11/20/2021   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 10/25/2021   Malignant neoplasm of left breast in female, estrogen receptor positive ([place]) 06/25/2021   Genetic testing 01/15/2021   Family history of cancer    Invasive carcinoma of breast ([place]) 12/03/2020   Breast mass, left 07/01/2020   Situational mixed anxiety and depressive disorder 07/01/2020   Anxiety state 08/06/2014   Varicose veins of lower extremity 08/06/2014    PCP: Juluis Pitch, MD   REFERRING PROVIDER: Sindy Guadeloupe, MD  REFERRING DIAG: 197.2  THERAPY DIAG: Postmastectomy Mathis syndrome  ONSET DATE: 9/22  SUBJECTIVE  SUBJECTIVE STATEMENT: Vickie Mathis presents to OT to address LUE/LUQ post-mastectomy Mathis. Pt reports ongoing pain in L lateral trunk , shoulder and neck. Pt does not rate pain   in the LUQ numerically. Pt states she is looking for a way to restart exercise routine. She states she will need to get stronger to resume her typical job duties.   PERTINENT HISTORY: 2021 self palpated L breast mass. Diagnostic mammogram and ultrasound revealed 2.7 cm mass at 12 o'clock 6 cm from the nipple. L axillary ultrasound revealed 2 suspicious LN. Both breast mass and LN + for invasive mammary carcinoma grade 3, ER/PR and her 2 negative.R breast non-mass enhancement was negative for Br Ca.Pt underwent neoadjuvant  chemotherapy. 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 She underwent bilateral mastectomy with reconstruction in 9/22.Pathology revealed complete pathological response with 2 LN negative and R breast negative for malignancy. Breast implants removed 2/2 staff infection. Anxiety, chemo-induced neuropathy, intermittent L chest wall pain,   IMPAIRMENTS: chronic pain L chest wall and lateral trunk s/P bilateral mastectomy and ALND, fatigue, altered sensation 2/2 chemo-induced neuropathy   FUNCTIONAL LIMITATIONS: pain at end ranges BUE shoulders, R>L; decreased hand function, limited participation in productive activities and work while recovering from cancer treatment.  OTHER OBSTACLES TO OT LE CARE: none identified at initial eval  PAIN:  Are you having pain? Yes NPRS scale: 2-4/10 Pain location: L lateral trunk and chest at mastectomy site PAIN TYPE: chronic Pain description: intermittent , sore, tender, tight numb Aggravating factors: lifting, carrying, pushing, pulling, reaching overhead Relieving factors: unknown   PRECAUTIONS: Other: Mathis precautions   PATIENT GOALS: relieve post-mastectomy pain and limit LE progression   OBJECTIVE BLE COMPARATIVE LIMB VOLUMETRICS:   09/02/22 INITIAL LUE  LANDMARK LEFT  09/02/22  L MPs 20.5 cm  RUE (excluding hand)  2910.9 ml     Limb Volume differential (LVD)  %  Volume change since initial %  Volume change overall %  (Blank rows = not tested) :   LANDMARK LEFT  10/15/12 (10 th VISIT)  L MPs 20.5 cm  RUE (excluding hand)  2910.9 ml     Limb Volume differential (LVD)  %  Volume change since initial LUE volume Increase 8.3 % since initially measured on 09/02/22  Volume change overall %  (Blank rows = not tested) :   PATIENT SURVEYS:  FOTO functional outcome measure: Intake TBA OT Rx visit 1   Mathis LIFE IMPACT SCALE: (The extent to which Mathis-related problems affected your  life last week)  Intake score: 20.59%    TODAY'S TREATMENT:                                                                                                                                        MLD to LUE/LUQ Pt and family edu for LE self-care and progress towards all goals  LUE/LUQ MLD as established with simultaneous skin care  NO KINESIOTAPE TODAY due to time constraints. (Kinesio tape  to L lateral trunk and chest wall anchoring at contralateral spine and extending anteriorly across surgical scars)   PATIENT EDUCATION: Continued Pt/ CG edu for Mathis self care home program throughout session. Topics include outcome of comparative limb volumetrics- starting limb volume differentials (LVDs), technology and gradient techniques used for short stretch, multilayer compression wrapping, simple self-MLD, therapeutic lymphatic pumping exercises, skin/nail care, LE precautions,. compression garment recommendations and specifications, wear and care schedule and compression garment donning / doffing w assistive devices. Discussed progress towards all OT goals since commencing CDT. All questions answered to the Pt's satisfaction. Good return.  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 2 x daily PRN 2.  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 Belisse compression bra ( 39 a/b) and a JoviPak double mastectomy pad for HOS to reduce fibrosis and facilitate improved lymphatic function to limit progression. 3. Daily skin care to sustain optimal hydration. Wash bites, scratches, blisters, etc with mild soap and water ,  apply antibacterial first aide cream and a band aide. 4. Simple self-Manual lymphatic drainage (MLD) PRN.  ASSESSMENT:  CLINICAL IMPRESSION:   Continued LUQ MLD incorporating intercostal pathways today. Pt c/o tenderness in this area of L lateral trunk. Some fibrosis palpable. Cont as per POC. Still awaiting arrival of compression bra and JoviPak inserts.   OBJECTIVE IMPAIRMENTS: decreased knowledge of Mathis ;  decreased knowledge of use of DME, decreased ROM, increased edema, impaired sensation, and pain with onset in   ACTIVITY LIMITATIONS: carrying, lifting, bed mobility, bathing, dressing, reach over head, and hygiene/grooming  PARTICIPATION LIMITATIONS: occupation  PERSONAL FACTORS:  Hx comorbid anxiety  is also affecting patient's functional outcome.   REHAB POTENTIAL: Good  CLINICAL DECISION MAKING: Stable/uncomplicated   GOALS: Goals reviewed with patient? Yes  LONG TERM GOALS: Target date: 11/22/21   Given this patient's Intake score of 20.59/100% on the Mathis Life Impact Scale (LLIS), patient will experience a reduction of at least 5% in her perceived level of functional impairment resulting from Mathis to improve functional performance and quality of life (QOL). Baseline: 20.59%  Goal status: 10/07/22 PROGRESSING  2.  Given this patient's Intake score of TBA/100% on the functional outcomes FOTO tool, patient will experience an increase in function of 5 points  to improve basic and instrumental ADLs performance, including Mathis self-care. Baseline:  TBA Goal status: 10/07/22 PROGRESSING  3.   Pt will demonstrate understanding of Mathis precautions and prevention strategies with modified independence using a printed reference to identify at least 5 precautions and discussing how s/he may implement them into daily life to reduce risk of progression and to limit infection risk. Baseline: Max A Goal status:10/07/22 GOAL MET  4.  Pt will be able to don and  doff prophylactic compression arm sleeve using assistive devices and extra time (modified independent), and discuss wear and garment care recommendations  to limit Mathis by issue date. Baseline: dependent Goal status: 09/15/22 GOAL MET  5.  Pt will demonstrate the ability to perform all Mathis self-care home program components with printed reference sheets (modified independence) by DC to  limit Mathis progression Baseline: dependent Goal status: 10/07/22 PROGRESSING  6.  Pt will report 0/10 shoulder pain with AROM in all planes at end ranges bilaterally against gravity by DC for optimal functional performance of basic and instrumental ADLs, work and productive activities, leisure pursuits and social participation, Baseline: 2/10 Goal status: 10/07/22 PROGRESSING  PLAN:  PT FREQUENCY: 1-2x/week  PT DURATION: 12 weeks and PRN  PLANNED INTERVENTIONS: Therapeutic exercises, Therapeutic activity, Neuromuscular re-education, Balance training, Gait training, Patient/Family education, Self Care, and Joint mobilization  PLAN FOR NEXT SESSION:  Cont LUE/LUQ MLD , scar massage and myofascial release. Cont L trunk and chest wall Kinesiotape as tolerated Continue teaching LE self care Assess BUE AROM Complete comparative limb volumetrics for LUE  Andrey Spearman, MS, OTR/L, CLT-LANA 11/27/22 8:53 AM

## 2022-12-01 ENCOUNTER — Ambulatory Visit: Payer: Commercial Managed Care - HMO | Admitting: Occupational Therapy

## 2022-12-01 DIAGNOSIS — I972 Postmastectomy lymphedema syndrome: Secondary | ICD-10-CM | POA: Diagnosis not present

## 2022-12-02 ENCOUNTER — Ambulatory Visit
Admission: RE | Admit: 2022-12-02 | Discharge: 2022-12-02 | Disposition: A | Discharge status: Home / Self Care | Payer: Commercial Managed Care - HMO | Source: Ambulatory Visit | Attending: Oncology | Admitting: Oncology

## 2022-12-02 ENCOUNTER — Encounter: Payer: Self-pay | Admitting: Occupational Therapy

## 2022-12-02 ENCOUNTER — Telehealth: Payer: Self-pay | Admitting: *Deleted

## 2022-12-02 DIAGNOSIS — S0010XA Contusion of unspecified eyelid and periocular area, initial encounter: Secondary | ICD-10-CM

## 2022-12-02 DIAGNOSIS — C50919 Malignant neoplasm of unspecified site of unspecified female breast: Secondary | ICD-10-CM | POA: Diagnosis present

## 2022-12-02 DIAGNOSIS — Z9181 History of falling: Secondary | ICD-10-CM | POA: Diagnosis present

## 2022-12-02 NOTE — Telephone Encounter (Signed)
I filled out [employer] form and faxed it to [phone] and fax transmission went through

## 2022-12-02 NOTE — Therapy (Signed)
OUTPATIENT OCCUPATIONAL THERAPY  TREATMENT NOTE  [place] POST-MASTECTOMY LYMPHEDEMA  Patient Name: Jaedyn Leight MRN: [id number] DOB:[birth year], 32 y.o., female Today's Date: 12/02/2022  END OF SESSION:  OT End of Session - 12/01/22 1241     Visit Number 18    Number of Visits 36    Date for OT Re-Evaluation 02/22/23    OT Start Time 0107    OT Stop Time 0210    OT Time Calculation (min) 63 min    Activity Tolerance Patient tolerated treatment well;No increased pain    Behavior During Therapy WFL for tasks assessed/performed                   Past Medical History:  Diagnosis Date   Anxiety    Asthma    Breast cancer ([place]) 11/2020   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 AXILLARY NODE DISSECTION;  Surgeon: Herbert Pun, MD;  Location: [facility];  Service: General;  Laterality: Bilateral;  [place], 1.5 hours Cintron-Diaz 2.5 hours   BREAST BIOPSY Left 11/26/2020   vision 12:00 6cmfn [facility]   BREAST BIOPSY Left 11/26/2020   LN bx, hydro marker,  fragments of macrometastatic carcinoma   BREAST RECONSTRUCTION WITH PLACEMENT OF TISSUE EXPANDER AND FLEX HD (ACELLULAR HYDRATED DERMIS) Bilateral 06/25/2021   Procedure: BREAST RECONSTRUCTION WITH PLACEMENT OF TISSUE EXPANDER AND FLEX HD (ACELLULAR HYDRATED DERMIS);  Surgeon: Wallace Going, DO;  Location: [facility];  Service: Plastics;  Laterality: Bilateral;   PORTACATH PLACEMENT Right 12/13/2020   Procedure: INSERTION PORT-A-CATH;  Surgeon: Herbert Pun, MD;  Location: [facility];  Service: General;  Laterality: Right;   REMOVAL OF BILATERAL TISSUE  EXPANDERS WITH PLACEMENT OF BILATERAL BREAST IMPLANTS Bilateral 09/15/2021   Procedure: REMOVAL OF BILATERAL TISSUE EXPANDERS;  Surgeon: Wallace Going, DO;  Location: [place];  Service: Plastics;  Laterality: Bilateral;   Patient Active Problem List   Diagnosis Date Noted   Chemotherapy-induced neuropathy ([place]) 02/06/2022   Right sided weakness 02/06/2022   Transaminitis 11/20/2021   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 10/25/2021   Malignant neoplasm of left breast in female, estrogen receptor positive ([place]) 06/25/2021   Genetic testing 01/15/2021   Family history of cancer    Invasive carcinoma of breast ([place]) 12/03/2020   Breast mass, left 07/01/2020   Situational mixed anxiety and depressive disorder 07/01/2020   Anxiety state 08/06/2014   Varicose veins of lower extremity 08/06/2014    PCP: Juluis Pitch, MD   REFERRING PROVIDER: Sindy Guadeloupe, MD  REFERRING DIAG: 197.2  THERAPY DIAG: Post-mastectomy lymphedema syndrome  ONSET DATE: 9/22  SUBJECTIVE  SUBJECTIVE STATEMENT: Yittel Rachford presents to OT to address LUE/LUQ post-mastectomy lymphedema. Pt reports ongoing pain in L lateral trunk , shoulder and neck. Pt does not rate pain  in the LUQ numerically. Pt states she is looking for a way to restart exercise routine. She states she will need to get stronger to resume her typical job duties.   PERTINENT HISTORY: 2021 self palpated L breast mass. Diagnostic mammogram and ultrasound revealed 2.7 cm mass at 12 o'clock 6 cm from the nipple. L axillary ultrasound revealed 2 suspicious LN. Both breast mass and LN + for invasive mammary carcinoma grade 3, ER/PR and her 2 negative.R breast non-mass enhancement was negative for Br Ca.Pt underwent neoadjuvant  chemotherapy. 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 She underwent bilateral mastectomy with reconstruction in 9/22.Pathology revealed complete pathological response with 2 LN negative and R breast negative for malignancy. Breast implants removed 2/2 staff infection. Anxiety, chemo-induced neuropathy, intermittent L chest wall pain,   IMPAIRMENTS: chronic pain L chest wall and lateral trunk s/P bilateral mastectomy and ALND, fatigue, altered sensation 2/2 chemo-induced neuropathy   FUNCTIONAL LIMITATIONS: pain at end ranges BUE shoulders, R>L; decreased hand function, limited participation in productive activities and work while recovering from cancer treatment.  OTHER OBSTACLES TO OT LE CARE: none identified at initial eval  PAIN:  Are you having pain? Yes NPRS scale: 2-4/10 Pain location: L lateral trunk and chest at mastectomy site PAIN TYPE: chronic Pain description: intermittent , sore, tender, tight numb Aggravating factors: lifting, carrying, pushing, pulling, reaching overhead Relieving factors: unknown   PRECAUTIONS: Other: lymphedema precautions   PATIENT GOALS: relieve post-mastectomy pain and limit LE progression   OBJECTIVE BLE COMPARATIVE LIMB VOLUMETRICS:   09/02/22 INITIAL LUE  LANDMARK LEFT  09/02/22  L MPs 20.5 cm  RUE (excluding hand)  2910.9 ml     Limb Volume differential (LVD)  %  Volume change since initial %  Volume change overall %  (Blank rows = not tested) :   LANDMARK LEFT  10/15/12 (10 th VISIT)  L MPs 20.5 cm  RUE (excluding hand)  2910.9 ml     Limb Volume differential (LVD)  %  Volume change since initial LUE volume Increase 8.3 % since initially measured on 09/02/22  Volume change overall %  (Blank rows = not tested) :   PATIENT SURVEYS:  FOTO functional outcome measure: Intake TBA OT Rx visit 1   LYMPHEDEMA LIFE IMPACT SCALE: (The extent to which lymphedema-related problems affected your  life last week)  Intake score: 20.59%    TODAY'S TREATMENT:                                                                                                                                        MLD to LUE/LUQ Pt and family edu for LE self-care and progress towards all goals LUE/LUQ  MLD as established with simultaneous skin care  PATIENT EDUCATION: Continued Pt/ CG edu for lymphedema self care home program throughout session. Topics include outcome of comparative limb volumetrics- starting limb volume differentials (LVDs), technology and gradient techniques used for short stretch, multilayer compression wrapping, simple self-MLD, therapeutic lymphatic pumping exercises, skin/nail care, LE precautions,. compression garment recommendations and specifications, wear and care schedule and compression garment donning / doffing w assistive devices. Discussed progress towards all OT goals since commencing CDT. All questions answered to the Pt's satisfaction. Good return.  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 2 x daily PRN 2.  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 Belisse compression bra ( 39 a/b) and a JoviPak double mastectomy pad for HOS to reduce fibrosis and facilitate improved lymphatic function to limit progression. 3. Daily skin care to sustain optimal hydration. Wash bites, scratches, blisters, etc with mild soap and water , apply antibacterial first aide cream and a band aide. 4. Simple self-Manual lymphatic drainage (MLD) PRN.  ASSESSMENT:  CLINICAL IMPRESSION:   Continued LUQ MLD incorporating  intercostal pathways today. Pt c/o tenderness in this area of L lateral trunk. Some fibrosis palpable. Cont as per POC. Still awaiting arrival of compression bra and JoviPak inserts.   OBJECTIVE IMPAIRMENTS: decreased knowledge of lymphedema ;  decreased knowledge of use of DME, decreased ROM, increased edema, impaired sensation, and pain with onset in   ACTIVITY LIMITATIONS: carrying, lifting, bed mobility, bathing, dressing, reach over head, and hygiene/grooming  PARTICIPATION LIMITATIONS: occupation  PERSONAL FACTORS:  Hx comorbid anxiety  is also affecting patient's functional outcome.   REHAB POTENTIAL: Good  CLINICAL DECISION MAKING: Stable/uncomplicated   GOALS: Goals reviewed with patient? Yes  LONG TERM GOALS: Target date: 11/22/21   Given this patient's Intake score of 20.59/100% on the Lymphedema Life Impact Scale (LLIS), patient will experience a reduction of at least 5% in her perceived level of functional impairment resulting from lymphedema to improve functional performance and quality of life (QOL). Baseline: 20.59%  Goal status: 10/07/22 PROGRESSING  2.  Given this patient's Intake score of TBA/100% on the functional outcomes FOTO tool, patient will experience an increase in function of 5 points  to improve basic and instrumental ADLs performance, including lymphedema self-care. Baseline:  TBA Goal status: 10/07/22 PROGRESSING  3.   Pt will demonstrate understanding of lymphedema precautions and prevention strategies with modified independence using a printed reference to identify at least 5 precautions and discussing how s/he may implement them into daily life to reduce risk of progression and to limit infection risk. Baseline: Max A Goal status:10/07/22 GOAL MET  4.  Pt will be able to don and doff prophylactic compression arm sleeve using assistive devices and extra time (modified independent), and discuss wear and garment care recommendations  to limit lymphedema by  issue date. Baseline: dependent Goal status: 09/15/22 GOAL MET  5.  Pt will demonstrate the ability to perform all lymphedema self-care home program components with printed reference sheets (modified independence) by DC to limit lymphedema progression Baseline: dependent Goal status: 10/07/22 PROGRESSING  6.  Pt will report 0/10 shoulder pain with AROM in all planes at end ranges bilaterally against gravity by  DC for optimal functional performance of basic and instrumental ADLs, work and productive activities, leisure pursuits and social participation, Baseline: 2/10 Goal status: 10/07/22 PROGRESSING  PLAN:  PT FREQUENCY: 1-2x/week  PT DURATION: 12 weeks and PRN  PLANNED INTERVENTIONS: Therapeutic exercises, Therapeutic activity, Neuromuscular re-education, Balance training, Gait training, Patient/Family education, Self Care, and Joint mobilization  PLAN FOR NEXT SESSION:  Cont LUE/LUQ MLD , scar massage and myofascial release. Cont L trunk and chest wall Kinesiotape as tolerated Continue teaching LE self care Assess BUE AROM Complete comparative limb volumetrics for LUE  Andrey Spearman, MS, OTR/L, CLT-LANA 12/02/22 12:44 PM

## 2022-12-03 ENCOUNTER — Ambulatory Visit: Payer: Commercial Managed Care - HMO | Admitting: Occupational Therapy

## 2022-12-03 DIAGNOSIS — I972 Postmastectomy lymphedema syndrome: Secondary | ICD-10-CM | POA: Diagnosis not present

## 2022-12-03 NOTE — Therapy (Signed)
OUTPATIENT OCCUPATIONAL THERAPY  TREATMENT NOTE  [place] POST-MASTECTOMY LYMPHEDEMA  Patient Name: Vickie Mathis MRN: [id number] DOB:1990-12-06, 32 y.o., female Today's Date: 12/03/2022  END OF SESSION:  OT End of Session - 12/03/22 1305     Visit Number 19    Number of Visits 36    Date for OT Re-Evaluation 02/22/23    OT Start Time 0106    OT Stop Time 0206    OT Time Calculation (min) 60 min    Activity Tolerance Patient tolerated treatment well;No increased pain    Behavior During Therapy WFL for tasks assessed/performed                   Past Medical History:  Diagnosis Date   Anxiety    Asthma    Breast cancer ([place]) 11/2020   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 AXILLARY NODE DISSECTION;  Surgeon: Herbert Pun, MD;  Location: [facility];  Service: General;  Laterality: Bilateral;  [place], 1.5 hours Cintron-Diaz 2.5 hours   BREAST BIOPSY Left 11/26/2020   vision 12:00 6cmfn [facility]   BREAST BIOPSY Left 11/26/2020   LN bx, hydro marker,  fragments of macrometastatic carcinoma   BREAST RECONSTRUCTION WITH PLACEMENT OF TISSUE EXPANDER AND FLEX HD (ACELLULAR HYDRATED DERMIS) Bilateral 06/25/2021   Procedure: BREAST RECONSTRUCTION WITH PLACEMENT OF TISSUE EXPANDER AND FLEX HD (ACELLULAR HYDRATED DERMIS);  Surgeon: Wallace Going, DO;  Location: [facility];  Service: Plastics;  Laterality: Bilateral;   PORTACATH PLACEMENT Right 12/13/2020   Procedure: INSERTION PORT-A-CATH;  Surgeon: Herbert Pun, MD;  Location: [facility];  Service: General;  Laterality: Right;   REMOVAL OF BILATERAL TISSUE  EXPANDERS WITH PLACEMENT OF BILATERAL BREAST IMPLANTS Bilateral 09/15/2021   Procedure: REMOVAL OF BILATERAL TISSUE EXPANDERS;  Surgeon: Wallace Going, DO;  Location: [place];  Service: Plastics;  Laterality: Bilateral;   Patient Active Problem List   Diagnosis Date Noted   Chemotherapy-induced neuropathy ([place]) 02/06/2022   Right sided weakness 02/06/2022   Transaminitis 11/20/2021   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 10/25/2021   Malignant neoplasm of left breast in female, estrogen receptor positive ([place]) 06/25/2021   Genetic testing 01/15/2021   Family history of cancer    Invasive carcinoma of breast ([place]) 12/03/2020   Breast mass, left 07/01/2020   Situational mixed anxiety and depressive disorder 07/01/2020   Anxiety state 08/06/2014   Varicose veins of lower extremity 08/06/2014    PCP: Juluis Pitch, MD   REFERRING PROVIDER: Sindy Guadeloupe, MD  REFERRING DIAG: 197.2  THERAPY DIAG: Post-mastectomy lymphedema syndrome  ONSET DATE: 9/22  SUBJECTIVE  SUBJECTIVE STATEMENT: Vickie Mathis presents to OT to address LUE/LUQ post-mastectomy lymphedema. Pt reports ongoing pain in L lateral trunk , shoulder and neck. Pt does not rate pain  in the LUQ numerically. Pt states she is looking for a way to restart exercise routine. She states she will need to get stronger to resume her typical job duties. Pt reports she has not yet returned DME vendor's calls re recommended compression garments/ devices. OT encouraged Pt to call to find out out of pocket costs, if any, and if needs be I can assist her to fill out a [employer] application. Pt agreed to call DME vendor tomorrow.   PERTINENT HISTORY: 2021 self palpated L breast mass. Diagnostic mammogram and  ultrasound revealed 2.7 cm mass at 12 o'clock 6 cm from the nipple. L axillary ultrasound revealed 2 suspicious LN. Both breast mass and LN + for invasive mammary carcinoma grade 3, ER/PR and her 2 negative.R breast non-mass enhancement was negative for Br Ca.Pt underwent neoadjuvant chemotherapy. 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 She underwent bilateral mastectomy with reconstruction in 9/22.Pathology revealed complete pathological response with 2 LN negative and R breast negative for malignancy. Breast implants removed 2/2 staff infection. Anxiety, chemo-induced neuropathy, intermittent L chest wall pain,   IMPAIRMENTS: chronic pain L chest wall and lateral trunk s/P bilateral mastectomy and ALND, fatigue, altered sensation 2/2 chemo-induced neuropathy   FUNCTIONAL LIMITATIONS: pain at end ranges BUE shoulders, R>L; decreased hand function, limited participation in productive activities and work while recovering from cancer treatment.  OTHER OBSTACLES TO OT LE CARE: none identified at initial eval  PAIN:  Are you having pain? Yes NPRS scale: 2-4/10 Pain location: L lateral trunk and chest at mastectomy site PAIN TYPE: chronic Pain description: intermittent , sore, tender, tight numb Aggravating factors: lifting, carrying, pushing, pulling, reaching overhead Relieving factors: unknown   PRECAUTIONS: Other: lymphedema precautions   PATIENT GOALS: relieve post-mastectomy pain and limit LE progression   OBJECTIVE BLE COMPARATIVE LIMB VOLUMETRICS:   09/02/22 INITIAL LUE  LANDMARK LEFT  09/02/22  L MPs 20.5 cm  RUE (excluding hand)  2910.9 ml     Limb Volume differential (LVD)  %  Volume change since initial %  Volume change overall %  (Blank rows = not tested) :    LUE COMPARATIVE LIMB VOLUMETRICS:   09/02/22 10 th visit  LANDMARK LEFT  10/15/12   L MPs 20.5 cm  RUE (excluding hand)  2910.9 ml     Limb Volume differential  (LVD)  %  Volume change since initial LUE volume Increase 8.3 % since initially measured on 09/02/22  Volume change overall %  (Blank rows = not tested) :   LUE COMPARATIVE LIMB VOLUMETRICS:   12/02/22 19 th visit  LANDMARK LUE (19 th visit)  L MPs 20.4 cm  RUE (excluding hand)  3150.6 ml     Limb Volume differential (LVD)    Volume change since 10 th visit INC 0.25%  Volume change since initial INC 8.23%  (Blank rows = not tested) :   PATIENT SURVEYS:  FOTO functional outcome measure: Intake TBA OT Rx visit 1   LYMPHEDEMA LIFE IMPACT SCALE: (The extent to which lymphedema-related problems affected your life last week)  Intake score: 20.59%    TODAY'S TREATMENT:  MLD to LUE/LUQ Pt and family edu for LE self-care and progress towards all goals LUE/LUQ MLD as established with simultaneous skin care  PATIENT EDUCATION: Continued Pt/ CG edu for lymphedema self care home program throughout session. Topics include outcome of comparative limb volumetrics- starting limb volume differentials (LVDs), technology and gradient techniques used for short stretch, multilayer compression wrapping, simple self-MLD, therapeutic lymphatic pumping exercises, skin/nail care, LE precautions,. compression garment recommendations and specifications, wear and care schedule and compression garment donning / doffing w assistive devices. Discussed progress towards all OT goals since commencing CDT. All questions answered to the Pt's satisfaction. Good return.  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 2 x daily PRN 2.   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 Belisse compression bra ( 39 a/b) and a JoviPak double mastectomy pad for HOS to reduce fibrosis and facilitate improved lymphatic function to limit progression. 3. Daily skin care to sustain optimal hydration. Wash bites, scratches, blisters, etc with mild soap and water , apply antibacterial first aide cream and a band aide. 4. Simple self-Manual lymphatic drainage (MLD) PRN.  ASSESSMENT:  CLINICAL IMPRESSION:   In prep for progress review next visit we completed LUE comparative limb volumetrics . Volumetrics reveal LUE   has remained nearly identical in volume compared to volume measured  on 10 th visit on 10/15/22. Limb volume of treatment limb is essentially stable . Overall increase in LUE volume measures 8.234%, which I ascribe to increased activity level (returned to work part time) and increased muscle mass. Pt tolerated MLD as established;lished today without increased pain. Unfortunately discomfort in chest wall and at surgical sights remains the same as when we commenced MLD. Cont as per POC. Assist w CF application PRN. Fit garments ASAP.  OBJECTIVE IMPAIRMENTS: decreased knowledge of lymphedema ;  decreased knowledge of use of DME, decreased ROM, increased edema, impaired sensation, and pain with onset in   ACTIVITY LIMITATIONS: carrying, lifting, bed mobility, bathing, dressing, reach over head, and hygiene/grooming  PARTICIPATION LIMITATIONS: occupation  PERSONAL FACTORS:  Hx comorbid anxiety  is also affecting patient's functional outcome.   REHAB POTENTIAL: Good  CLINICAL DECISION MAKING: Stable/uncomplicated   GOALS: Goals reviewed with patient? Yes  LONG TERM GOALS: Target date: 11/22/21   Given this patient's Intake score of 20.59/100% on the Lymphedema Life Impact Scale (LLIS), patient will experience  a reduction of at least 5% in her perceived level of functional impairment resulting from lymphedema to improve functional performance and quality of life (QOL). Baseline: 20.59%  Goal status: 10/07/22 PROGRESSING  2.  Given this patient's Intake score of TBA/100% on the functional outcomes FOTO tool, patient will experience an increase in function of 5 points  to improve basic and instrumental ADLs performance, including lymphedema self-care. Baseline:  TBA Goal status: 10/07/22 PROGRESSING  3.   Pt will demonstrate understanding of lymphedema precautions and prevention strategies with modified independence using a printed reference to identify at least 5 precautions and discussing how s/he may implement them into daily life to reduce risk of progression and to limit infection risk. Baseline: Max A Goal status:10/07/22 GOAL MET  4.  Pt will be able to don and doff  prophylactic compression arm sleeve using assistive devices and extra time (modified independent), and discuss wear and garment care recommendations  to limit lymphedema by issue date. Baseline: dependent Goal status: 09/15/22 GOAL MET  5.  Pt will demonstrate the ability to perform all lymphedema self-care home program components with printed reference sheets (modified independence) by DC to limit lymphedema progression Baseline: dependent Goal status: 10/07/22 PROGRESSING  6.  Pt will report 0/10 shoulder pain with AROM in all planes at end ranges bilaterally against gravity by DC for optimal functional performance of basic and instrumental ADLs, work and productive activities, leisure pursuits and social participation, Baseline: 2/10 Goal status: 10/07/22 PROGRESSING  PLAN:  PT FREQUENCY: 1-2x/week  PT DURATION: 12 weeks and PRN  PLANNED INTERVENTIONS: Therapeutic exercises, Therapeutic activity, Neuromuscular re-education, Balance training, Gait training, Patient/Family education, Self Care, and Joint mobilization  PLAN FOR NEXT  SESSION:  Cont LUE/LUQ MLD , scar massage and myofascial release. Cont L trunk and chest wall Kinesiotape as tolerated Continue teaching LE self care Progress report  Andrey Spearman, MS, OTR/L, CLT-LANA 12/03/22 2:13 PM

## 2022-12-08 ENCOUNTER — Ambulatory Visit: Payer: Commercial Managed Care - HMO | Attending: Oncology | Admitting: Occupational Therapy

## 2022-12-08 DIAGNOSIS — I972 Postmastectomy lymphedema syndrome: Secondary | ICD-10-CM | POA: Diagnosis not present

## 2022-12-08 NOTE — Therapy (Signed)
OUTPATIENT OCCUPATIONAL THERAPY  TREATMENT NOTE  [place] POST-MASTECTOMY LYMPHEDEMA  Patient Name: Vickie Mathis MRN: [id number] DOB:[birth year], 32 y.o., female Today's Date: 12/08/2022  END OF SESSION:  OT End of Session - 12/08/22 1316     Visit Number 20    Number of Visits 36    Date for OT Re-Evaluation 02/22/23    OT Start Time 0108    Activity Tolerance Patient tolerated treatment well;No increased pain    Behavior During Therapy WFL for tasks assessed/performed                   Past Medical History:  Diagnosis Date   Anxiety    Asthma    Breast cancer ([place]) 11/2020   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 AXILLARY NODE DISSECTION;  Surgeon: Herbert Pun, MD;  Location: [facility];  Service: General;  Laterality: Bilateral;  [place], 1.5 hours Cintron-Diaz 2.5 hours   BREAST BIOPSY Left 11/26/2020   vision 12:00 6cmfn [facility]   BREAST BIOPSY Left 11/26/2020   LN bx, hydro marker,  fragments of macrometastatic carcinoma   BREAST RECONSTRUCTION WITH PLACEMENT OF TISSUE EXPANDER AND FLEX HD (ACELLULAR HYDRATED DERMIS) Bilateral 06/25/2021   Procedure: BREAST RECONSTRUCTION WITH PLACEMENT OF TISSUE EXPANDER AND FLEX HD (ACELLULAR HYDRATED DERMIS);  Surgeon: Wallace Going, DO;  Location: [facility];  Service: Plastics;  Laterality: Bilateral;   PORTACATH PLACEMENT Right 12/13/2020   Procedure: INSERTION PORT-A-CATH;  Surgeon: Herbert Pun, MD;  Location: [facility];  Service: General;  Laterality: Right;   REMOVAL OF BILATERAL TISSUE EXPANDERS WITH PLACEMENT OF BILATERAL BREAST IMPLANTS  Bilateral 09/15/2021   Procedure: REMOVAL OF BILATERAL TISSUE EXPANDERS;  Surgeon: Wallace Going, DO;  Location: [place];  Service: Plastics;  Laterality: Bilateral;   Patient Active Problem List   Diagnosis Date Noted   Chemotherapy-induced neuropathy ([place]) 02/06/2022   Right sided weakness 02/06/2022   Transaminitis 11/20/2021   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 10/25/2021   Malignant neoplasm of left breast in female, estrogen receptor positive ([place]) 06/25/2021   Genetic testing 01/15/2021   Family history of cancer    Invasive carcinoma of breast ([place]) 12/03/2020   Breast mass, left 07/01/2020   Situational mixed anxiety and depressive disorder 07/01/2020   Anxiety state 08/06/2014   Varicose veins of lower extremity 08/06/2014    PCP: Juluis Pitch, MD   REFERRING PROVIDER: Sindy Guadeloupe, MD  REFERRING DIAG: 197.2  THERAPY DIAG: Post-mastectomy lymphedema syndrome  ONSET DATE: 9/22  SUBJECTIVE  SUBJECTIVE STATEMENT: Vickie Mathis presents to OT to address LUE/LUQ post-mastectomy lymphedema. Pt reports 3/10 pain in L lateral trunk , shoulder and neck.  Pt reports she has pain in the liver region. Pt reports she heard from [employer] and they are running her insurance benefits  to check benefits for recommended compression bras and Jovi pads.This DME is in network with [employer], whereas DME provider typically used is not. Pt verbalizes understanding that she needs to bring invoice from DME group  to [facility] to [place] for charitable assistance if she has a co pay and is unable to covcer it.  PERTINENT HISTORY: 2021 self palpated L breast mass. Diagnostic mammogram and ultrasound revealed 2.7 cm mass at 12 o'clock 6 cm from the nipple. L axillary  ultrasound revealed 2 suspicious LN. Both breast mass and LN + for invasive mammary carcinoma grade 3, ER/PR and her 2 negative.R breast non-mass enhancement was negative for Br Ca.Pt underwent neoadjuvant chemotherapy. 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 She underwent bilateral mastectomy with reconstruction in 9/22.Pathology revealed complete pathological response with 2 LN negative and R breast negative for malignancy. Breast implants removed 2/2 staff infection. Anxiety, chemo-induced neuropathy, intermittent L chest wall pain,   IMPAIRMENTS: chronic pain L chest wall and lateral trunk s/P bilateral mastectomy and ALND, fatigue, altered sensation 2/2 chemo-induced neuropathy   FUNCTIONAL LIMITATIONS: pain at end ranges BUE shoulders, R>L; decreased hand function, limited participation in productive activities and work while recovering from cancer treatment.  OTHER OBSTACLES TO OT LE CARE: none identified at initial eval  PAIN:  Are you having pain? Yes NPRS scale: 2-4/10 Pain location: L lateral trunk and chest at mastectomy site PAIN TYPE: chronic Pain description: intermittent , sore, tender, tight numb Aggravating factors: lifting, carrying, pushing, pulling, reaching overhead Relieving factors: unknown   PRECAUTIONS: Other: lymphedema precautions   PATIENT GOALS: relieve post-mastectomy pain and limit LE progression   OBJECTIVE BLE COMPARATIVE LIMB VOLUMETRICS:   09/02/22 INITIAL LUE  LANDMARK LEFT  09/02/22  L MPs 20.5 cm  RUE (excluding hand)  2910.9 ml     Limb Volume differential (LVD)  %  Volume change since initial %  Volume change overall %  (Blank rows = not tested) :    LUE COMPARATIVE LIMB VOLUMETRICS:   09/02/22 10 th visit  LANDMARK LEFT  10/15/12   L MPs 20.5 cm  RUE (excluding hand)  2910.9 ml     Limb Volume differential (LVD)  %  Volume change since initial LUE volume Increase 8.3 % since initially  measured on 09/02/22  Volume change overall %  (Blank rows = not tested) :   LUE COMPARATIVE LIMB VOLUMETRICS:   12/02/22 19 th visit  LANDMARK LUE (19 th visit)  L MPs 20.4 cm  RUE (excluding hand)  3150.6 ml     Limb Volume differential (LVD)    Volume change since 10 th visit INC 0.25%  Volume change since initial INC 8.23%  (Blank rows = not tested) :   PATIENT SURVEYS:  FOTO functional outcome measure: Intake TBA OT Rx visit 1   LYMPHEDEMA LIFE IMPACT SCALE: (The extent to which lymphedema-related problems affected your life last week)  Intake score: 20.59%    TODAY'S TREATMENT:  MLD to LUE/LUQ and to R lateral trunk and chest wall Pt and family edu for LE self-care and progress towards all goals LUE/LUQ MLD as established with simultaneous skin care  PATIENT EDUCATION: Continued Pt/ CG edu for lymphedema self care home program throughout session. Topics include outcome of comparative limb volumetrics- starting limb volume differentials (LVDs), technology and gradient techniques used for short stretch, multilayer compression wrapping, simple self-MLD, therapeutic lymphatic pumping exercises, skin/nail care, LE precautions,. compression garment recommendations and specifications, wear and care schedule and compression garment donning / doffing w assistive devices. Discussed progress towards all OT goals since commencing CDT. All questions answered to the Pt's satisfaction. Good return.  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 2 x daily PRN 2.  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 Belisse compression bra ( 39 a/b) and a JoviPak double mastectomy pad for HOS to reduce fibrosis and facilitate improved lymphatic function to limit progression. 3. Daily skin care to sustain optimal hydration. Wash bites, scratches, blisters, etc with mild soap and water , apply antibacterial first aide cream and a band aide. 4. Simple self-Manual lymphatic drainage (MLD) PRN.  ASSESSMENT:  CLINICAL IMPRESSION:  Pt continues to make progress towards all OT goals for LE careVolumetrics reveal LUE   has remained nearly identical in volume compared to volume measured  on 10 th visit on 10/15/22. Limb volume of treatment limb is essentially stable . Overall increase in LUE volume measures 8.234%, which I ascribe to increased activity level (returned to work part time) and increased muscle mass. Pt tolerated MLD as established;lished today without increased pain. Unfortunately discomfort in chest wall and at surgical sights remains the same as when we commenced MLD. Cont as per POC. Assist w CF application PRN. Fit garments ASAP.  OBJECTIVE IMPAIRMENTS: decreased knowledge of lymphedema ;  decreased knowledge of use of DME, decreased ROM, increased edema, impaired sensation, and pain with onset in   ACTIVITY LIMITATIONS: carrying, lifting, bed mobility, bathing, dressing, reach over head, and hygiene/grooming  PARTICIPATION LIMITATIONS: occupation  PERSONAL FACTORS:  Hx comorbid anxiety  is also affecting patient's functional outcome.   REHAB POTENTIAL: Good  CLINICAL DECISION MAKING: Stable/uncomplicated   GOALS: Goals reviewed with patient? Yes  LONG TERM GOALS: Target date: 11/22/21   Given this patient's Intake score of 20.59/100% on the Lymphedema Life Impact Scale (LLIS), patient will experience a reduction of at least 5% in her perceived level of  functional impairment resulting from lymphedema to improve functional performance and quality of life (QOL). Baseline: 20.59%  Goal status: 10/07/22 PROGRESSING  2.  Given this patient's Intake score of TBA/100% on the functional outcomes FOTO tool, patient will experience an increase in function of 5 points  to improve basic and instrumental ADLs performance, including lymphedema self-care. Baseline:  TBA Goal status: 10/07/22 PROGRESSING  3.   Pt will demonstrate understanding of lymphedema precautions and prevention strategies with modified independence using a printed reference to identify at least 5 precautions and discussing how s/he may implement them into daily life to reduce risk of progression and to limit infection risk. Baseline: Max A Goal status:10/07/22 GOAL MET  4.  Pt will be able  to don and doff prophylactic compression arm sleeve using assistive devices and extra time (modified independent), and discuss wear and garment care recommendations  to limit lymphedema by issue date. Baseline: dependent Goal status: 09/15/22 GOAL MET  5.  Pt will demonstrate the ability to perform all lymphedema self-care home program components with printed reference sheets (modified independence) by DC to limit lymphedema progression Baseline: dependent Goal status: 10/07/22 PROGRESSING  6.  Pt will report 0/10 shoulder pain with AROM in all planes at end ranges bilaterally against gravity by DC for optimal functional performance of basic and instrumental ADLs, work and productive activities, leisure pursuits and social participation, Baseline: 2/10 Goal status: 10/07/22 PROGRESSING  PLAN:  PT FREQUENCY: 1-2x/week  PT DURATION: 12 weeks and PRN  PLANNED INTERVENTIONS: Therapeutic exercises, Therapeutic activity, Neuromuscular re-education, Balance training, Gait training, Patient/Family education, Self Care, and Joint mobilization  PLAN FOR NEXT SESSION:  Cont LUE/LUQ MLD , scar massage and  myofascial release. Cont L trunk and chest wall Kinesiotape as tolerated Continue teaching LE self care Progress report  Andrey Spearman, MS, OTR/L, CLT-LANA 12/08/22 1:19 PM

## 2022-12-10 ENCOUNTER — Ambulatory Visit: Payer: Commercial Managed Care - HMO | Admitting: Occupational Therapy

## 2022-12-10 DIAGNOSIS — I972 Postmastectomy lymphedema syndrome: Secondary | ICD-10-CM | POA: Diagnosis not present

## 2022-12-10 NOTE — Therapy (Signed)
OUTPATIENT OCCUPATIONAL THERAPY  TREATMENT NOTE  [place] POST-MASTECTOMY LYMPHEDEMA  Patient Name: Vickie Mathis MRN: [id number] DOB:1990-11-30, 32 y.o., female Today's Date: 12/10/2022  END OF SESSION:  OT End of Session - 12/10/22 1403     Visit Number 21    Number of Visits 36    Date for OT Re-Evaluation 02/22/23    OT Start Time 0100    OT Stop Time 0200    OT Time Calculation (min) 60 min    Activity Tolerance Patient tolerated treatment well;No increased pain    Behavior During Therapy WFL for tasks assessed/performed                   Past Medical History:  Diagnosis Date   Anxiety    Asthma    Breast cancer ([place]) 11/2020   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 AXILLARY NODE DISSECTION;  Surgeon: Herbert Pun, MD;  Location: [facility] ORS;  Service: General;  Laterality: Bilateral;  [place], 1.5 hours Cintron-Diaz 2.5 hours   BREAST BIOPSY Left 11/26/2020   vision 12:00 6cmfn [facility]   BREAST BIOPSY Left 11/26/2020   LN bx, hydro marker,  fragments of macrometastatic carcinoma   BREAST RECONSTRUCTION WITH PLACEMENT OF TISSUE EXPANDER AND FLEX HD (ACELLULAR HYDRATED DERMIS) Bilateral 06/25/2021   Procedure: BREAST RECONSTRUCTION WITH PLACEMENT OF TISSUE EXPANDER AND FLEX HD (ACELLULAR HYDRATED DERMIS);  Surgeon: Wallace Going, DO;  Location: [facility] ORS;  Service: Plastics;  Laterality: Bilateral;   PORTACATH PLACEMENT Right 12/13/2020   Procedure: INSERTION PORT-A-CATH;  Surgeon: Herbert Pun, MD;  Location: [facility] ORS;  Service: General;  Laterality: Right;   REMOVAL OF BILATERAL TISSUE  EXPANDERS WITH PLACEMENT OF BILATERAL BREAST IMPLANTS Bilateral 09/15/2021   Procedure: REMOVAL OF BILATERAL TISSUE EXPANDERS;  Surgeon: Wallace Going, DO;  Location: [place];  Service: Plastics;  Laterality: Bilateral;   Patient Active Problem List   Diagnosis Date Noted   Chemotherapy-induced neuropathy ([place]) 02/06/2022   Right sided weakness 02/06/2022   Transaminitis 11/20/2021   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 10/25/2021   Malignant neoplasm of left breast in female, estrogen receptor positive ([place]) 06/25/2021   Genetic testing 01/15/2021   Family history of cancer    Invasive carcinoma of breast ([place]) 12/03/2020   Breast mass, left 07/01/2020   Situational mixed anxiety and depressive disorder 07/01/2020   Anxiety state 08/06/2014   Varicose veins of lower extremity 08/06/2014    PCP: Juluis Pitch, MD   REFERRING PROVIDER: Sindy Guadeloupe, MD  REFERRING DIAG: 197.2  THERAPY DIAG: Post-mastectomy lymphedema syndrome  ONSET DATE: 9/22  SUBJECTIVE  SUBJECTIVE STATEMENT: Vickie Mathis presents to OT to address LUE/LUQ post-mastectomy lymphedema. Pt reports 3/10 pain in L and R lateral trunk , shoulder and neck.  Pt reports she continues to have pain in the liver region as well. Pt brings order from [employer] coverage for recommended compression bras and Jovi pads.This DME is in network with Pt's insurance. Unfortunately they must not have received my fax itemizing recommendations because they fit Pt with standard mastectomy bras and prostheses. OT left VM for [facility] office manager requesting call back. Pt states she would like to get the mastectomy bras and prostheses she tried on in addition to compression.   PERTINENT HISTORY: 2021 self  palpated L breast mass. Diagnostic mammogram and ultrasound revealed 2.7 cm mass at 12 o'clock 6 cm from the nipple. L axillary ultrasound revealed 2 suspicious LN. Both breast mass and LN + for invasive mammary carcinoma grade 3, ER/PR and her 2 negative.R breast non-mass enhancement was negative for Br Ca.Pt underwent neoadjuvant chemotherapy. 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 She underwent bilateral mastectomy with reconstruction in 9/22.Pathology revealed complete pathological response with 2 LN negative and R breast negative for malignancy. Breast implants removed 2/2 staff infection. Anxiety, chemo-induced neuropathy, intermittent L chest wall pain,   IMPAIRMENTS: chronic pain L chest wall and lateral trunk s/P bilateral mastectomy and ALND, fatigue, altered sensation 2/2 chemo-induced neuropathy   FUNCTIONAL LIMITATIONS: pain at end ranges BUE shoulders, R>L; decreased hand function, limited participation in productive activities and work while recovering from cancer treatment.  OTHER OBSTACLES TO OT LE CARE: none identified at initial eval  PAIN:  Are you having pain? Yes NPRS scale: 2-4/10 Pain location: L and R lateral trunk and chest at mastectomy site, R lateral trunk adjacent to axilla PAIN TYPE: chronic Pain description: intermittent , sore, tender, tight numb Aggravating factors: lifting, carrying, pushing, pulling, reaching overhead Relieving factors: unknown   PRECAUTIONS: Other: lymphedema precautions   PATIENT GOALS: relieve post-mastectomy pain and limit LE progression   OBJECTIVE BLE COMPARATIVE LIMB VOLUMETRICS:   09/02/22 INITIAL LUE  LANDMARK LEFT  09/02/22  L MPs 20.5 cm  RUE (excluding hand)  2910.9 ml     Limb Volume differential (LVD)  %  Volume change since initial %  Volume change overall %  (Blank rows = not tested) :    LUE COMPARATIVE LIMB VOLUMETRICS:   09/02/22 10 th visit  LANDMARK LEFT   10/15/12   L MPs 20.5 cm  RUE (excluding hand)  2910.9 ml     Limb Volume differential (LVD)  %  Volume change since initial LUE volume Increase 8.3 % since initially measured on 09/02/22  Volume change overall %  (Blank rows = not tested) :   LUE COMPARATIVE LIMB VOLUMETRICS:   12/02/22 19 th visit  LANDMARK LUE (19 th visit)  L MPs 20.4 cm  RUE (excluding hand)  3150.6 ml     Limb Volume differential (LVD)    Volume change since 10 th visit INC 0.25%  Volume change since initial INC 8.23%  (Blank rows = not tested) :   PATIENT SURVEYS:  FOTO functional outcome measure: Intake TBA OT Rx visit 1   LYMPHEDEMA LIFE IMPACT SCALE: (The extent to which lymphedema-related problems affected your life last week)  Intake score: 20.59%    TODAY'S TREATMENT:  MLD to LUE/LUQ and to R lateral trunk and chest wall Pt and family edu for LE self-care and progress towards all goals LUE/LUQ MLD as established with simultaneous skin care  PATIENT EDUCATION: Continued Pt/ CG edu for lymphedema self care home program throughout session. Topics include outcome of comparative limb volumetrics- starting limb volume differentials (LVDs), technology and gradient techniques used for short stretch, multilayer compression wrapping, simple self-MLD, therapeutic lymphatic pumping exercises, skin/nail care, LE precautions,. compression garment recommendations and specifications, wear and care schedule and compression garment donning / doffing w assistive devices. Discussed progress towards all OT goals since commencing CDT. All questions answered to the Pt's satisfaction. Good return.  Person educated: Patientand spouse, Engineer, drilling method: Handouts, demonstration Education comprehension: verbalized understanding, returned demonstration, and needs further education  HOME  EXERCISE PROGRAM: 1.Therapeutic lymphatic pumping therex- 2 sets of 10 reps, hold 5 seconds; elements in order. 2 x daily PRN 2.  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 Belisse compression bra ( 39 a/b) and a JoviPak double mastectomy pad for HOS to reduce fibrosis and facilitate improved lymphatic function to limit progression. 3. Daily skin care to sustain optimal hydration. Wash bites, scratches, blisters, etc with mild soap and water , apply antibacterial first aide cream and a band aide. 4. Simple self-Manual lymphatic drainage (MLD) PRN.  ASSESSMENT: Pt with ongoing pain in lateral trunk bilaterally 3/10. Myofacial Release techniques on the R trunk today were uncomfortable by report, but tolerated without difficulty. Unfortunately DME provider did not fit Pt with recommended compression bras and convoluted foam pads    , which should provide some pain relief and help to reduce fibrosis . Pt continues to benefit from OT for CDT. Fit Belisse Bras and JoviPak pads ASAP. Cont as per POC.  CLINICAL IMPRESSION:  Pt Cont as per POC. .  OBJECTIVE IMPAIRMENTS: decreased knowledge of lymphedema ;  decreased knowledge of use of DME, decreased ROM, increased edema, impaired sensation, and pain with onset in   ACTIVITY LIMITATIONS: carrying, lifting, bed mobility, bathing, dressing, reach over head, and hygiene/grooming  PARTICIPATION LIMITATIONS: occupation  PERSONAL FACTORS:  Hx comorbid anxiety  is also affecting patient's functional outcome.   REHAB POTENTIAL: Good  CLINICAL DECISION MAKING: Stable/uncomplicated   GOALS: Goals reviewed with patient? Yes  LONG TERM GOALS: Target date: 11/22/21   Given this patient's Intake score of 20.59/100% on the Lymphedema Life Impact Scale (LLIS), patient will experience a reduction of at least 5% in her perceived level of  functional impairment resulting from lymphedema to improve functional performance and quality of life (QOL). Baseline: 20.59%  Goal status: 10/07/22 PROGRESSING; 12/08/22 Progressing  2.  Given this patient's Intake score of TBA/100% on the functional outcomes FOTO tool, patient will experience an increase in function of 5 points  to improve basic and instrumental ADLs performance, including lymphedema self-care. Baseline:  TBA Goal status: DEFERRED  3.   Pt will demonstrate understanding of lymphedema precautions and prevention strategies with modified independence using a printed reference to identify at least 5 precautions and discussing how s/he may implement them into daily life to reduce risk of progression and to limit infection risk. Baseline: Max A Goal status:10/07/22 GOAL MET  4.  Pt will be able to don and doff prophylactic compression arm sleeve using assistive devices and extra time (modified independent), and discuss wear and garment  care recommendations  to limit lymphedema by issue date. Baseline: dependent Goal status: 09/15/22 GOAL MET  5.  Pt will demonstrate the ability to perform all lymphedema self-care home program components with printed reference sheets (modified independence) by DC to limit lymphedema progression Baseline: dependent Goal status: 10/07/22 PROGRESSING. 12/09/22 PROGRESSING. Awaiting custom compression bra and convoluted foam inserts for HOS.  6.  Pt will report 0/10 shoulder pain with AROM in all planes at end ranges bilaterally against gravity by DC for optimal functional performance of basic and instrumental ADLs, work and productive activities, leisure pursuits and social participation, Baseline: 2/10 Goal status: 10/07/22 PROGRESSING. 12/08/22 Ongoing.  PLAN:  PT FREQUENCY: 1-2x/week  PT DURATION: 12 weeks and PRN  PLANNED INTERVENTIONS: Therapeutic exercises, Therapeutic activity, Neuromuscular re-education, Balance training, Gait training,  Patient/Family education, Self Care, and Joint mobilization  PLAN FOR NEXT SESSION:  Cont LUE/LUQ MLD , scar massage and myofascial release. Cont L trunk and chest wall Kinesiotape as tolerated Continue teaching LE self care Progress report  Andrey Spearman, MS, OTR/L, CLT-LANA 12/10/22 2:05 PM

## 2022-12-11 ENCOUNTER — Inpatient Hospital Stay: Payer: Commercial Managed Care - HMO | Attending: Oncology

## 2022-12-11 ENCOUNTER — Inpatient Hospital Stay: Payer: Commercial Managed Care - HMO

## 2022-12-11 DIAGNOSIS — Z853 Personal history of malignant neoplasm of breast: Secondary | ICD-10-CM | POA: Insufficient documentation

## 2022-12-11 DIAGNOSIS — K754 Autoimmune hepatitis: Secondary | ICD-10-CM | POA: Insufficient documentation

## 2022-12-11 DIAGNOSIS — R7401 Elevation of levels of liver transaminase levels: Secondary | ICD-10-CM

## 2022-12-11 DIAGNOSIS — Z171 Estrogen receptor negative status [ER-]: Secondary | ICD-10-CM

## 2022-12-11 DIAGNOSIS — Z95828 Presence of other vascular implants and grafts: Secondary | ICD-10-CM

## 2022-12-11 LAB — CBC WITH DIFFERENTIAL/PLATELET
Abs Immature Granulocytes: 0.04 10*3/uL (ref 0.00–0.07)
Basophils Absolute: 0.1 10*3/uL (ref 0.0–0.1)
Basophils Relative: 1 %
Eosinophils Absolute: 0.2 10*3/uL (ref 0.0–0.5)
Eosinophils Relative: 3 %
HCT: 37.3 % (ref 36.0–46.0)
Hemoglobin: 12.7 g/dL (ref 12.0–15.0)
Immature Granulocytes: 1 %
Lymphocytes Relative: 17 %
Lymphs Abs: 1.2 10*3/uL (ref 0.7–4.0)
MCH: 32.4 pg (ref 26.0–34.0)
MCHC: 34 g/dL (ref 30.0–36.0)
MCV: 95.2 fL (ref 80.0–100.0)
Monocytes Absolute: 0.8 10*3/uL (ref 0.1–1.0)
Monocytes Relative: 10 %
Neutro Abs: 5.1 10*3/uL (ref 1.7–7.7)
Neutrophils Relative %: 68 %
Platelets: 187 10*3/uL (ref 150–400)
RBC: 3.92 MIL/uL (ref 3.87–5.11)
RDW: 12.6 % (ref 11.5–15.5)
WBC: 7.3 10*3/uL (ref 4.0–10.5)
nRBC: 0 % (ref 0.0–0.2)

## 2022-12-11 LAB — COMPREHENSIVE METABOLIC PANEL
ALT: 56 U/L — ABNORMAL HIGH (ref 0–44)
AST: 45 U/L — ABNORMAL HIGH (ref 15–41)
Albumin: 4.2 g/dL (ref 3.5–5.0)
Alkaline Phosphatase: 96 U/L (ref 38–126)
Anion gap: 8 (ref 5–15)
BUN: 9 mg/dL (ref 6–20)
CO2: 25 mmol/L (ref 22–32)
Calcium: 8.5 mg/dL — ABNORMAL LOW (ref 8.9–10.3)
Chloride: 103 mmol/L (ref 98–111)
Creatinine, Ser: 0.66 mg/dL (ref 0.44–1.00)
GFR, Estimated: >60 mL/min (ref >60)
Glucose, Bld: 98 mg/dL (ref 70–99)
Potassium: 3.7 mmol/L (ref 3.5–5.1)
Sodium: 136 mmol/L (ref 135–145)
Total Bilirubin: 0.4 mg/dL (ref 0.3–1.2)
Total Protein: 7.1 g/dL (ref 6.5–8.1)

## 2022-12-11 MED ORDER — SODIUM CHLORIDE 0.9% FLUSH
10.0000 mL | Freq: Once | INTRAVENOUS | Status: AC
  Administered 2022-12-11: 10 mL via INTRAVENOUS
  Filled 2022-12-11: qty 10

## 2022-12-11 MED ORDER — HEPARIN SOD (PORK) LOCK FLUSH 100 UNIT/ML IV SOLN
500.0000 [IU] | Freq: Once | INTRAVENOUS | Status: AC
  Administered 2022-12-11: 500 [IU] via INTRAVENOUS
  Filled 2022-12-11: qty 5

## 2022-12-12 ENCOUNTER — Other Ambulatory Visit: Payer: Self-pay | Admitting: Oncology

## 2022-12-15 ENCOUNTER — Encounter: Payer: Self-pay | Admitting: Occupational Therapy

## 2022-12-15 ENCOUNTER — Ambulatory Visit: Payer: Commercial Managed Care - HMO | Admitting: Occupational Therapy

## 2022-12-15 DIAGNOSIS — I972 Postmastectomy lymphedema syndrome: Secondary | ICD-10-CM | POA: Diagnosis not present

## 2022-12-15 NOTE — Therapy (Signed)
OUTPATIENT OCCUPATIONAL THERAPY  TREATMENT NOTE  [place] POST-MASTECTOMY LYMPHEDEMA  Patient Name: Vickie Mathis MRN: [id number] DOB:[birth year], 32 y.o., female Today's Date: 12/15/2022  END OF SESSION:  OT End of Session - 12/15/22 1524     Visit Number 22    Number of Visits 36    Date for OT Re-Evaluation 02/22/23    OT Start Time 0305    OT Stop Time 0405    OT Time Calculation (min) 60 min    Activity Tolerance Patient tolerated treatment well;No increased pain    Behavior During Therapy WFL for tasks assessed/performed                   Past Medical History:  Diagnosis Date   Anxiety    Asthma    Breast cancer ([place]) 11/2020   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 AXILLARY NODE DISSECTION;  Surgeon: Herbert Pun, MD;  Location: [facility];  Service: General;  Laterality: Bilateral;  [place], 1.5 hours Cintron-Diaz 2.5 hours   BREAST BIOPSY Left 11/26/2020   vision 12:00 6cmfn [facility]   BREAST BIOPSY Left 11/26/2020   LN bx, hydro marker,  fragments of macrometastatic carcinoma   BREAST RECONSTRUCTION WITH PLACEMENT OF TISSUE EXPANDER AND FLEX HD (ACELLULAR HYDRATED DERMIS) Bilateral 06/25/2021   Procedure: BREAST RECONSTRUCTION WITH PLACEMENT OF TISSUE EXPANDER AND FLEX HD (ACELLULAR HYDRATED DERMIS);  Surgeon: Wallace Going, DO;  Location: [facility];  Service: Plastics;  Laterality: Bilateral;   PORTACATH PLACEMENT Right 12/13/2020   Procedure: INSERTION PORT-A-CATH;  Surgeon: Herbert Pun, MD;  Location: [facility];  Service: General;  Laterality: Right;   REMOVAL OF BILATERAL TISSUE  EXPANDERS WITH PLACEMENT OF BILATERAL BREAST IMPLANTS Bilateral 09/15/2021   Procedure: REMOVAL OF BILATERAL TISSUE EXPANDERS;  Surgeon: Wallace Going, DO;  Location: [place];  Service: Plastics;  Laterality: Bilateral;   Patient Active Problem List   Diagnosis Date Noted   Chemotherapy-induced neuropathy ([place]) 02/06/2022   Right sided weakness 02/06/2022   Transaminitis 11/20/2021   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 10/25/2021   Malignant neoplasm of left breast in female, estrogen receptor positive ([place]) 06/25/2021   Genetic testing 01/15/2021   Family history of cancer    Invasive carcinoma of breast ([place]) 12/03/2020   Breast mass, left 07/01/2020   Situational mixed anxiety and depressive disorder 07/01/2020   Anxiety state 08/06/2014   Varicose veins of lower extremity 08/06/2014    PCP: Juluis Pitch, MD   REFERRING PROVIDER: Sindy Guadeloupe, MD  REFERRING DIAG: 197.2  THERAPY DIAG: Post-mastectomy lymphedema syndrome  ONSET DATE: 9/22  SUBJECTIVE  SUBJECTIVE STATEMENT: Vickie Mathis presents to OT to address LUE/LUQ post-mastectomy lymphedema. Pt reports 3/10 pain in L and R lateral trunk , shoulder and neck.  Pt reports she continues to have pain in the liver region as well. OT  Updated Pt on recommended compression pads and bra. [employer] does not carry the [employer]. They do carry the Jovi foam pad  for fibrosis pad.   PERTINENT HISTORY: 2021 self palpated L breast mass. Diagnostic mammogram and ultrasound revealed 2.7 cm mass at 12 o'clock 6 cm from the nipple. L axillary ultrasound revealed 2 suspicious LN. Both breast mass and LN + for invasive mammary carcinoma grade 3, ER/PR and her 2 negative.R breast non-mass enhancement was negative for  Br Ca.Pt underwent neoadjuvant chemotherapy. 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 She underwent bilateral mastectomy with reconstruction in 9/22.Pathology revealed complete pathological response with 2 LN negative and R breast negative for malignancy. Breast implants removed 2/2 staff infection. Anxiety, chemo-induced neuropathy, intermittent L chest wall pain,   IMPAIRMENTS: chronic pain L chest wall and lateral trunk s/P bilateral mastectomy and ALND, fatigue, altered sensation 2/2 chemo-induced neuropathy   FUNCTIONAL LIMITATIONS: pain at end ranges BUE shoulders, R>L; decreased hand function, limited participation in productive activities and work while recovering from cancer treatment.  OTHER OBSTACLES TO OT LE CARE: none identified at initial eval  PAIN:  Are you having pain? Yes NPRS scale: 2-4/10 Pain location: L and R lateral trunk and chest at mastectomy site, R lateral trunk adjacent to axilla PAIN TYPE: chronic Pain description: intermittent , sore, tender, tight numb Aggravating factors: lifting, carrying, pushing, pulling, reaching overhead Relieving factors: unknown   PRECAUTIONS: Other: lymphedema precautions   PATIENT GOALS: relieve post-mastectomy pain and limit LE progression   OBJECTIVE BLE COMPARATIVE LIMB VOLUMETRICS:   09/02/22 INITIAL LUE  LANDMARK LEFT  09/02/22  L MPs 20.5 cm  RUE (excluding hand)  2910.9 ml     Limb Volume differential (LVD)  %  Volume change since initial %  Volume change overall %  (Blank rows = not tested) :    LUE COMPARATIVE LIMB VOLUMETRICS:   09/02/22 10 th visit  LANDMARK LEFT  10/15/12   L MPs 20.5 cm  RUE (excluding hand)  2910.9 ml     Limb Volume differential (LVD)  %  Volume change since initial LUE volume Increase 8.3 % since initially measured on 09/02/22  Volume change overall %  (Blank rows = not tested) :   LUE COMPARATIVE LIMB VOLUMETRICS:   12/02/22 19 th  visit  LANDMARK LUE (19 th visit)  L MPs 20.4 cm  RUE (excluding hand)  3150.6 ml     Limb Volume differential (LVD)    Volume change since 10 th visit INC 0.25%  Volume change since initial INC 8.23%  (Blank rows = not tested) :   PATIENT SURVEYS:  FOTO functional outcome measure: Intake TBA OT Rx visit 1   LYMPHEDEMA LIFE IMPACT SCALE: (The extent to which lymphedema-related problems affected your life last week)  Intake score: 20.59%    TODAY'S TREATMENT:  MLD to LUE/LUQ and to R lateral trunk and chest wall Pt and family edu for LE self-care and progress towards all goals LUE/LUQ MLD as established with simultaneous skin care  PATIENT EDUCATION: Continued Pt/ CG edu for lymphedema self care home program throughout session. Topics include outcome of comparative limb volumetrics- starting limb volume differentials (LVDs), technology and gradient techniques used for short stretch, multilayer compression wrapping, simple self-MLD, therapeutic lymphatic pumping exercises, skin/nail care, LE precautions,. compression garment recommendations and specifications, wear and care schedule and compression garment donning / doffing w assistive devices. Discussed progress towards all OT goals since commencing CDT. All questions answered to the Pt's satisfaction. Good return.  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 2 x daily PRN 2.  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  Belisse compression bra ( 39 a/b) and a JoviPak double mastectomy pad for HOS to reduce fibrosis and facilitate improved lymphatic function to limit progression. 3. Daily skin care to sustain optimal hydration. Wash bites, scratches, blisters, etc with mild soap and water , apply antibacterial first aide cream and a band aide. 4. Simple self-Manual lymphatic drainage (MLD) PRN.  ASSESSMENT: Pt with ongoing pain in lateral trunk bilaterally 3/10. Myofacial Release techniques on the R trunk today were uncomfortable by report, but tolerated without difficulty. Recommended compression bras and convoluted foam pads not available from the second DME vendor. OT issued a sample of the recommended pad provided from the manufacturer, [employer], and Pt will pair with a less expensive medical compression bra , or camisole, from [employer]. Hopefully this combination will provide some pain relief and help to reduce painful fibrosis at surgical site and lateral trunk. OT will modify PRN. Pt continues to benefit from OT for CDT. Cont as per POC.  CLINICAL IMPRESSION:  Pt Cont as per POC. .  OBJECTIVE IMPAIRMENTS: decreased knowledge of lymphedema ;  decreased knowledge of use of DME, decreased ROM, increased edema, impaired sensation, and pain with onset in   ACTIVITY LIMITATIONS: carrying, lifting, bed mobility, bathing, dressing, reach over head, and hygiene/grooming  PARTICIPATION LIMITATIONS: occupation  PERSONAL FACTORS:  Hx comorbid anxiety  is also affecting patient's functional outcome.   REHAB POTENTIAL: Good  CLINICAL DECISION MAKING: Stable/uncomplicated   GOALS: Goals reviewed with patient? Yes  LONG TERM GOALS: Target date: 11/22/21   Given this patient's Intake score of 20.59/100% on the Lymphedema Life Impact Scale (LLIS), patient will experience a reduction of at least 5% in her perceived level of functional impairment resulting from lymphedema to improve functional performance and quality of life  (QOL). Baseline: 20.59%  Goal status: 10/07/22 PROGRESSING; 12/08/22 Progressing  2.  Given this patient's Intake score of TBA/100% on the functional outcomes FOTO tool, patient will experience an increase in function of 5 points  to improve basic and instrumental ADLs performance, including lymphedema self-care. Baseline:  TBA Goal status: DEFERRED  3.   Pt will demonstrate understanding of lymphedema precautions and prevention strategies with modified independence using a printed reference to identify at least 5 precautions and discussing how s/he may implement them into daily life to reduce risk of progression and to limit infection risk. Baseline: Max A Goal status:10/07/22 GOAL  MET  4.  Pt will be able to don and doff prophylactic compression arm sleeve using assistive devices and extra time (modified independent), and discuss wear and garment care recommendations  to limit lymphedema by issue date. Baseline: dependent Goal status: 09/15/22 GOAL MET  5.  Pt will demonstrate the ability to perform all lymphedema self-care home program components with printed reference sheets (modified independence) by DC to limit lymphedema progression Baseline: dependent Goal status: 10/07/22 PROGRESSING. 12/09/22 PROGRESSING. Awaiting custom compression bra and convoluted foam inserts for HOS.  6.  Pt will report 0/10 shoulder pain with AROM in all planes at end ranges bilaterally against gravity by DC for optimal functional performance of basic and instrumental ADLs, work and productive activities, leisure pursuits and social participation, Baseline: 2/10 Goal status: 10/07/22 PROGRESSING. 12/08/22 Ongoing.  PLAN:  PT FREQUENCY: 1-2x/week  PT DURATION: 12 weeks and PRN  PLANNED INTERVENTIONS: Therapeutic exercises, Therapeutic activity, Neuromuscular re-education, Balance training, Gait training, Patient/Family education, Self Care, and Joint mobilization  PLAN FOR NEXT SESSION:  Cont LUE/LUQ MLD , scar  massage and myofascial release. Cont L trunk and chest wall Kinesiotape as tolerated Continue teaching LE self care Progress report  Andrey Spearman, MS, OTR/L, CLT-LANA 12/15/22 4:04 PM

## 2022-12-17 ENCOUNTER — Ambulatory Visit: Payer: Commercial Managed Care - HMO | Admitting: Occupational Therapy

## 2022-12-22 ENCOUNTER — Encounter: Payer: Self-pay | Admitting: Occupational Therapy

## 2022-12-22 ENCOUNTER — Ambulatory Visit: Payer: Commercial Managed Care - HMO | Admitting: Occupational Therapy

## 2022-12-22 DIAGNOSIS — I972 Postmastectomy lymphedema syndrome: Secondary | ICD-10-CM | POA: Diagnosis not present

## 2022-12-22 NOTE — Therapy (Signed)
OUTPATIENT OCCUPATIONAL THERAPY  TREATMENT NOTE  [place] POST-MASTECTOMY LYMPHEDEMA  Patient Name: Alliene Ashworth MRN: [id number] DOB:[birth year], 32 y.o., female Today's Date: 12/22/2022  END OF SESSION:  OT End of Session - 12/22/22 1315     Visit Number 23    Number of Visits 36    Date for OT Re-Evaluation 02/22/23    OT Start Time 0110    OT Stop Time 0210    OT Time Calculation (min) 60 min    Activity Tolerance Patient tolerated treatment well;No increased pain    Behavior During Therapy WFL for tasks assessed/performed                   Past Medical History:  Diagnosis Date   Anxiety    Asthma    Breast cancer ([place]) 11/2020   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 AXILLARY NODE DISSECTION;  Surgeon: Herbert Pun, MD;  Location: [facility] ORS;  Service: General;  Laterality: Bilateral;  [place], 1.5 hours Cintron-Diaz 2.5 hours   BREAST BIOPSY Left 11/26/2020   vision 12:00 6cmfn [facility]   BREAST BIOPSY Left 11/26/2020   LN bx, hydro marker,  fragments of macrometastatic carcinoma   BREAST RECONSTRUCTION WITH PLACEMENT OF TISSUE EXPANDER AND FLEX HD (ACELLULAR HYDRATED DERMIS) Bilateral 06/25/2021   Procedure: BREAST RECONSTRUCTION WITH PLACEMENT OF TISSUE EXPANDER AND FLEX HD (ACELLULAR HYDRATED DERMIS);  Surgeon: Wallace Going, DO;  Location: [facility] ORS;  Service: Plastics;  Laterality: Bilateral;   PORTACATH PLACEMENT Right 12/13/2020   Procedure: INSERTION PORT-A-CATH;  Surgeon: Herbert Pun, MD;  Location: [facility] ORS;  Service: General;  Laterality: Right;   REMOVAL OF BILATERAL TISSUE  EXPANDERS WITH PLACEMENT OF BILATERAL BREAST IMPLANTS Bilateral 09/15/2021   Procedure: REMOVAL OF BILATERAL TISSUE EXPANDERS;  Surgeon: Wallace Going, DO;  Location: [place];  Service: Plastics;  Laterality: Bilateral;   Patient Active Problem List   Diagnosis Date Noted   Chemotherapy-induced neuropathy ([place]) 02/06/2022   Right sided weakness 02/06/2022   Transaminitis 11/20/2021   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 10/25/2021   Malignant neoplasm of left breast in female, estrogen receptor positive ([place]) 06/25/2021   Genetic testing 01/15/2021   Family history of cancer    Invasive carcinoma of breast ([place]) 12/03/2020   Breast mass, left 07/01/2020   Situational mixed anxiety and depressive disorder 07/01/2020   Anxiety state 08/06/2014   Varicose veins of lower extremity 08/06/2014    PCP: Juluis Pitch, MD   REFERRING PROVIDER: Sindy Guadeloupe, MD  REFERRING DIAG: 197.2  THERAPY DIAG: Post-mastectomy lymphedema syndrome  ONSET DATE: 9/22  SUBJECTIVE  SUBJECTIVE STATEMENT: Myleene Brantly presents to OT to address LUE/LUQ post-mastectomy lymphedema. Pt reports 3/10 pain in L and R lateral trunk , shoulder and neck.  Pt reports she continues to have pain in the liver region as well.   PERTINENT HISTORY: 2021 self palpated L breast mass. Diagnostic mammogram and ultrasound revealed 2.7 cm mass at 12 o'clock 6 cm from the nipple. L axillary ultrasound revealed 2 suspicious LN. Both breast mass and LN + for invasive mammary carcinoma grade 3, ER/PR and her 2 negative.R breast non-mass enhancement was negative for Br Ca.Pt underwent neoadjuvant chemotherapy. 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 She underwent bilateral  mastectomy with reconstruction in 9/22.Pathology revealed complete pathological response with 2 LN negative and R breast negative for malignancy. Breast implants removed 2/2 staff infection. Anxiety, chemo-induced neuropathy, intermittent L chest wall pain,   IMPAIRMENTS: chronic pain L chest wall and lateral trunk s/P bilateral mastectomy and ALND, fatigue, altered sensation 2/2 chemo-induced neuropathy   FUNCTIONAL LIMITATIONS: pain at end ranges BUE shoulders, R>L; decreased hand function, limited participation in productive activities and work while recovering from cancer treatment.  OTHER OBSTACLES TO OT LE CARE: none identified at initial eval  PAIN:  Are you having pain? Yes NPRS scale: 2-4/10 Pain location: L and R lateral trunk and chest at mastectomy site, R lateral trunk adjacent to axilla PAIN TYPE: chronic Pain description: intermittent , sore, tender, tight numb Aggravating factors: lifting, carrying, pushing, pulling, reaching overhead Relieving factors: unknown   PRECAUTIONS: Other: lymphedema precautions   PATIENT GOALS: relieve post-mastectomy pain and limit LE progression   OBJECTIVE BLE COMPARATIVE LIMB VOLUMETRICS:   09/02/22 INITIAL LUE  LANDMARK LEFT  09/02/22  L MPs 20.5 cm  RUE (excluding hand)  2910.9 ml     Limb Volume differential (LVD)  %  Volume change since initial %  Volume change overall %  (Blank rows = not tested) :    LUE COMPARATIVE LIMB VOLUMETRICS:   09/02/22 10 th visit  LANDMARK LEFT  10/15/12   L MPs 20.5 cm  RUE (excluding hand)  2910.9 ml     Limb Volume differential (LVD)  %  Volume change since initial LUE volume Increase 8.3 % since initially measured on 09/02/22  Volume change overall %  (Blank rows = not tested) :   LUE COMPARATIVE LIMB VOLUMETRICS:   12/02/22 19 th visit  LANDMARK LUE (19 th visit)  L MPs 20.4 cm  RUE (excluding hand)  3150.6 ml     Limb Volume differential (LVD)    Volume change since 10 th visit  INC 0.25%  Volume change since initial INC 8.23%  (Blank rows = not tested) :   PATIENT SURVEYS:  FOTO functional outcome measure: Intake TBA OT Rx visit 1   LYMPHEDEMA LIFE IMPACT SCALE: (The extent to which lymphedema-related problems affected your life last week)  Intake score: 20.59%    TODAY'S TREATMENT:  MLD to LUE/LUQ and to R lateral trunk and chest wall Pt and family edu for LE self-care and progress towards all goals LUE/LUQ MLD as established with simultaneous skin care PATIENT EDUCATION: Continued Pt/ CG edu for lymphedema self care home program throughout session. Topics include outcome of comparative limb volumetrics- starting limb volume differentials (LVDs), technology and gradient techniques used for short stretch, multilayer compression wrapping, simple self-MLD, therapeutic lymphatic pumping exercises, skin/nail care, LE precautions,. compression garment recommendations and specifications, wear and care schedule and compression garment donning / doffing w assistive devices. Discussed progress towards all OT goals since commencing CDT. All questions answered to the Pt's satisfaction. Good return.  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 2 x daily PRN 2.  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 Belisse compression bra ( 39 a/b) and a JoviPak double mastectomy pad for HOS to reduce fibrosis and facilitate improved lymphatic function to limit progression. 3.  Daily skin care to sustain optimal hydration. Wash bites, scratches, blisters, etc with mild soap and water , apply antibacterial first aide cream and a band aide. 4. Simple self-Manual lymphatic drainage (MLD) PRN.  ASSESSMENT: Pt presents with convoluted foam JoviPak mastectomy pad within existing compression vest against skin over L breast surgical site and extending dorsally under axilla, following lymphatic anatomy along ribs and towards posterior axillary anastomosis. Unfortunately recommended compression bras are expensive and not covered by [employer] though local DME vendors. Consequently  Pt is using existing garment issued after surgery, and is looking on [facility] for additional, affordable suitable garments to se with the Jovi. Pt reports device some sensory discomfort. This is accomplished by creating areas of high and low compression that increases lymphatic function and reduces stasis, if any. Hopefully further reduction in chest wall and axillary    discomfort will continue to decrease using this device as much a as Pt is able to tolerate. Cont LUE/LUQ  MLD in supine and side lying as established today without increased pain.Also spent extra time on LUE. Pt continues to make progress towards  goals. Cont as per POC.  CLINICAL IMPRESSION:  Pt Cont as per POC. .  OBJECTIVE IMPAIRMENTS: decreased knowledge of lymphedema ;  decreased knowledge of use of DME, decreased ROM, increased edema, impaired sensation, and pain with onset in   ACTIVITY LIMITATIONS: carrying, lifting, bed mobility, bathing, dressing, reach over head, and hygiene/grooming  PARTICIPATION LIMITATIONS: occupation  PERSONAL FACTORS:  Hx comorbid anxiety  is also affecting patient's functional outcome.   REHAB POTENTIAL: Good  CLINICAL DECISION MAKING: Stable/uncomplicated   GOALS: Goals reviewed with patient? Yes  LONG TERM GOALS: Target date: 11/22/21   Given this patient's Intake score of 20.59/100% on the  Lymphedema Life Impact Scale (LLIS), patient will experience a reduction of at least 5% in her perceived level of functional impairment resulting from lymphedema to improve functional performance and quality of life (QOL). Baseline: 20.59%  Goal status: 10/07/22 PROGRESSING; 12/08/22 Progressing  2.  Given this patient's Intake score of TBA/100% on the functional outcomes FOTO tool, patient will experience an increase in function of 5 points  to improve basic and instrumental ADLs performance, including lymphedema self-care. Baseline:  TBA  Goal status: DEFERRED  3.   Pt will demonstrate understanding of lymphedema precautions and prevention strategies with modified independence using a printed reference to identify at least 5 precautions and discussing how s/he may implement them into daily life to reduce risk of progression and to limit infection risk. Baseline: Max A Goal status:10/07/22 GOAL MET  4.  Pt will be able to don and doff prophylactic compression arm sleeve using assistive devices and extra time (modified independent), and discuss wear and garment care recommendations  to limit lymphedema by issue date. Baseline: dependent Goal status: 09/15/22 GOAL MET  5.  Pt will demonstrate the ability to perform all lymphedema self-care home program components with printed reference sheets (modified independence) by DC to limit lymphedema progression Baseline: dependent Goal status: 10/07/22 PROGRESSING. 12/09/22 PROGRESSING. Awaiting custom compression bra and convoluted foam inserts for HOS.  6.  Pt will report 0/10 shoulder pain with AROM in all planes at end ranges bilaterally against gravity by DC for optimal functional performance of basic and instrumental ADLs, work and productive activities, leisure pursuits and social participation, Baseline: 2/10 Goal status: 10/07/22 PROGRESSING. 12/08/22 Ongoing.  PLAN:  PT FREQUENCY: 1-2x/week  PT DURATION: 12 weeks and PRN  PLANNED INTERVENTIONS:  Therapeutic exercises, Therapeutic activity, Neuromuscular re-education, Balance training, Gait training, Patient/Family education, Self Care, and Joint mobilization  PLAN FOR NEXT SESSION:  Cont LUE/LUQ MLD , scar massage and myofascial release. Cont L trunk and chest wall Kinesiotape as tolerated Continue teaching LE self care Progress report  Andrey Spearman, MS, OTR/L, CLT-LANA 12/22/22 2:15 PM

## 2022-12-23 ENCOUNTER — Encounter: Payer: Self-pay | Admitting: Occupational Therapy

## 2022-12-23 ENCOUNTER — Ambulatory Visit: Payer: Commercial Managed Care - HMO | Admitting: Occupational Therapy

## 2022-12-23 DIAGNOSIS — I972 Postmastectomy lymphedema syndrome: Secondary | ICD-10-CM | POA: Diagnosis not present

## 2022-12-23 NOTE — Therapy (Signed)
OUTPATIENT OCCUPATIONAL THERAPY  TREATMENT NOTE  [place] POST-MASTECTOMY LYMPHEDEMA  Patient Name: Vickie Mathis MRN: [id number] DOB:23-Mar-1991, 32 y.o., female Today's Date: 12/23/2022  END OF SESSION:  OT End of Session - 12/23/22 1014     Visit Number 24    Number of Visits 36    Date for OT Re-Evaluation 02/22/23    OT Start Time 1010    OT Stop Time 1105    OT Time Calculation (min) 55 min    Activity Tolerance Patient tolerated treatment well;No increased pain    Behavior During Therapy WFL for tasks assessed/performed                   Past Medical History:  Diagnosis Date   Anxiety    Asthma    Breast cancer ([place]) 11/2020   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 AXILLARY NODE DISSECTION;  Surgeon: Herbert Pun, MD;  Location: [facility];  Service: General;  Laterality: Bilateral;  [place], 1.5 hours Cintron-Diaz 2.5 hours   BREAST BIOPSY Left 11/26/2020   vision 12:00 6cmfn [facility]   BREAST BIOPSY Left 11/26/2020   LN bx, hydro marker,  fragments of macrometastatic carcinoma   BREAST RECONSTRUCTION WITH PLACEMENT OF TISSUE EXPANDER AND FLEX HD (ACELLULAR HYDRATED DERMIS) Bilateral 06/25/2021   Procedure: BREAST RECONSTRUCTION WITH PLACEMENT OF TISSUE EXPANDER AND FLEX HD (ACELLULAR HYDRATED DERMIS);  Surgeon: Wallace Going, DO;  Location: [facility];  Service: Plastics;  Laterality: Bilateral;   PORTACATH PLACEMENT Right 12/13/2020   Procedure: INSERTION PORT-A-CATH;  Surgeon: Herbert Pun, MD;  Location: [facility];  Service: General;  Laterality: Right;   REMOVAL OF BILATERAL TISSUE  EXPANDERS WITH PLACEMENT OF BILATERAL BREAST IMPLANTS Bilateral 09/15/2021   Procedure: REMOVAL OF BILATERAL TISSUE EXPANDERS;  Surgeon: Wallace Going, DO;  Location: [place];  Service: Plastics;  Laterality: Bilateral;   Patient Active Problem List   Diagnosis Date Noted   Chemotherapy-induced neuropathy ([place]) 02/06/2022   Right sided weakness 02/06/2022   Transaminitis 11/20/2021   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 10/25/2021   Malignant neoplasm of left breast in female, estrogen receptor positive ([place]) 06/25/2021   Genetic testing 01/15/2021   Family history of cancer    Invasive carcinoma of breast ([place]) 12/03/2020   Breast mass, left 07/01/2020   Situational mixed anxiety and depressive disorder 07/01/2020   Anxiety state 08/06/2014   Varicose veins of lower extremity 08/06/2014    PCP: Juluis Pitch, MD   REFERRING PROVIDER: Sindy Guadeloupe, MD  REFERRING DIAG: 197.2  THERAPY DIAG: Post-mastectomy lymphedema syndrome  ONSET DATE: 9/22  SUBJECTIVE  SUBJECTIVE STATEMENT: Vickie Mathis presents to OT to address LUE/LUQ post-mastectomy lymphedema and suspected post-mastectomy pain syndrome of LUQ. Pt reports 3/10 pain in L upper quadrant this morning. "It's where the fullness is. It wraps around my chest and under my arm and back to my shoulder blade. It's almost numb, but it's not. It dull.  It hurts."   PERTINENT HISTORY: 2021 self palpated L breast mass. Diagnostic mammogram and ultrasound revealed 2.7 cm mass at 12 o'clock 6 cm from the nipple. L axillary ultrasound revealed 2 suspicious LN. Both breast mass and LN + for invasive mammary carcinoma grade 3, ER/PR and her 2 negative.R breast non-mass enhancement was negative for Br Ca.Pt underwent neoadjuvant  chemotherapy. 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 She underwent bilateral mastectomy with reconstruction in 9/22.Pathology revealed complete pathological response with 2 LN negative and R breast negative for malignancy. Breast implants removed 2/2 staff infection. Anxiety, chemo-induced neuropathy, intermittent L chest wall pain,   IMPAIRMENTS: chronic pain and L chest wall and trunk swelling ( post-mastectomy pain syndrome), extending across L  surgical site, through axilla and across lateral trunk, to L scapula since  bilateral mastectomy and ALND. 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 Impaired body image, disturbed sleep,  OTHER OBSTACLES TO OT LE CARE: limited transportation, limited financial resources ,  limited emotional support from spouse  PAIN:  Are you having pain? Yes NPRS scale: 3/10 Pain location: L and R lateral trunk and chest at mastectomy site, R lateral trunk adjacent to axilla PAIN TYPE: chronic Pain description: intermittent , sore, tender, tight numb Aggravating factors: lifting, carrying, pushing, pulling, reaching overhead Relieving factors: unknown   PRECAUTIONS: Other: lymphedema precautions   PATIENT GOALS: relieve post-mastectomy pain and limit LE progression   OBJECTIVE BLE COMPARATIVE LIMB VOLUMETRICS:   09/02/22 INITIAL LUE  LANDMARK LEFT  09/02/22  L MPs 20.5 cm  RUE (excluding hand)  2910.9 ml     Limb Volume differential (LVD)  %  Volume change since initial %  Volume change overall %  (Blank rows = not tested) :    LUE COMPARATIVE LIMB VOLUMETRICS:   09/02/22 10 th visit  LANDMARK LEFT  10/15/12   L MPs 20.5 cm  RUE  (excluding hand)  2910.9 ml     Limb Volume differential (LVD)  %  Volume change since initial LUE volume Increase 8.3 % since initially measured on 09/02/22  Volume change overall %  (Blank rows = not tested) :   LUE COMPARATIVE LIMB VOLUMETRICS:   12/02/22 19 th visit  LANDMARK LUE (19 th visit)  L MPs 20.4 cm  RUE (excluding hand)  3150.6 ml     Limb Volume differential (LVD)    Volume change since 10 th visit INC 0.25%  Volume change since initial INC 8.23%  (Blank rows = not tested) :   PATIENT SURVEYS:    LYMPHEDEMA LIFE IMPACT SCALE: (The extent to which lymphedema-related problems affected your life last week)  Intake score: 20.59%    TODAY'S TREATMENT:  MLD to LUE/LUQ and to R lateral trunk and chest wall Pt and family edu for LE self-care and progress towards all goals  PATIENT EDUCATION: Pt/ family education for basic and advanced sequential pneumatic compression devices, or "pump", including clinical indications, how they work, how the basic pneumatic "pump" differs from the advanced Flexitouch device, indications, contraindications and precautions and care and use routines for pneumatic compression devices. Patient's questions were answered throughout trial and handouts and Internet resources given for reference.  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 2 x daily PRN 2.  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 Belisse compression bra ( 39 a/b) and a  JoviPak double mastectomy pad for HOS to reduce fibrosis and facilitate improved lymphatic function to limit progression. 3. Daily skin care to sustain optimal hydration. Wash bites, scratches, blisters, etc with mild soap and water , apply antibacterial first aide cream and a band aide. 4. Simple self-Manual lymphatic drainage (MLD) PRN.  ASSESSMENT:  Pt continues to experience post-mastectomy pain syndrome. MLD and compression with foam insert provide mild relief, but condition remains unresolved. Arm swelling is mildly increased as activity level has increased, but arm swelling is minimal and stable at present. Pt will benefit from  daily use of an advanced, sequential, compression device the Flexitouch, for optimal, long term lymphedema self-management. This device follows lymphatic anatomy offloading congestion to either  adjacent alternative watershed/ quadrant selected. In Ms. Hatler's case device should be programmed to direct fluid congestion towards adjacent , ipsilateral , axillary-inguinal anastomosis . In addition to lymphedema control this device is known to provide an analgesic effect  by reducing swelling that may exacerbate neuropathic pain. Pt would like to give explore the Flexitouch further. She gives verbal  OK to share her insurance info with the manufacturer's rep to check benefits coverage. Pt tolerated LUE/LUQ well today without increased pain. Cont 1-2 x weekly per POC.  CLINICAL IMPRESSION:  Pt Cont as per POC. .  OBJECTIVE IMPAIRMENTS: decreased knowledge of lymphedema ;  decreased knowledge of use of DME, decreased ROM, increased edema, impaired sensation, and pain with onset in   ACTIVITY LIMITATIONS: carrying, lifting, bed mobility, bathing, dressing, reach over head, and hygiene/grooming  PARTICIPATION LIMITATIONS: occupation  PERSONAL FACTORS:  Hx comorbid anxiety  is also affecting patient's functional outcome.   REHAB POTENTIAL: Good  CLINICAL DECISION MAKING:  Stable/uncomplicated   GOALS: Goals reviewed with patient? Yes  LONG TERM GOALS: Target date: 11/22/21   Given this patient's Intake score of 20.59/100% on the Lymphedema Life Impact Scale (LLIS), patient will experience a reduction of at least 5% in her perceived level of functional impairment resulting from lymphedema to improve functional performance and quality of life (QOL). Baseline: 20.59%  Goal status: 10/07/22 PROGRESSING; 12/08/22 Progressing  2.  Given this patient's Intake score of TBA/100% on the functional outcomes FOTO tool, patient will experience an increase in function of 5 points  to improve basic and instrumental ADLs performance, including lymphedema self-care. Baseline:  TBA Goal status: DEFERRED  3.   Pt will demonstrate understanding of lymphedema precautions and prevention strategies with modified independence using a printed reference to identify at least 5 precautions and discussing  how s/he may implement them into daily life to reduce risk of progression and to limit infection risk. Baseline: Max A Goal status:10/07/22 GOAL MET  4.  Pt will be able to don and doff prophylactic compression arm sleeve using assistive devices and extra time (modified independent), and discuss wear and garment care recommendations  to limit lymphedema by issue date. Baseline: dependent Goal status: 09/15/22 GOAL MET  5.  Pt will demonstrate the ability to perform all lymphedema self-care home program components with printed reference sheets (modified independence) by DC to limit lymphedema progression Baseline: dependent Goal status: 10/07/22 PROGRESSING. 12/09/22 PROGRESSING. Awaiting custom compression bra and convoluted foam inserts for HOS.  6.  Pt will report 0/10 shoulder pain with AROM in all planes at end ranges bilaterally against gravity by DC for optimal functional performance of basic and instrumental ADLs, work and productive activities, leisure pursuits and social  participation, Baseline: 2/10 Goal status: 10/07/22 PROGRESSING. 12/08/22 Ongoing.  PLAN:  PT FREQUENCY: 1-2x/week  PT DURATION: 12 weeks and PRN  PLANNED INTERVENTIONS: Therapeutic exercises, Therapeutic activity, Neuromuscular re-education, Balance training, Gait training, Patient/Family education, Self Care, and Joint mobilization  PLAN FOR NEXT SESSION:  Cont LUE/LUQ MLD , scar massage and myofascial release. Cont L trunk and chest wall Kinesiotape as tolerated Continue teaching LE self care Progress report  Andrey Spearman, MS, OTR/L, CLT-LANA 12/23/22 11:41 AM

## 2022-12-24 ENCOUNTER — Telehealth: Payer: Self-pay | Admitting: *Deleted

## 2022-12-24 ENCOUNTER — Ambulatory Visit: Payer: Commercial Managed Care - HMO | Admitting: Occupational Therapy

## 2022-12-24 ENCOUNTER — Other Ambulatory Visit: Payer: Self-pay | Admitting: *Deleted

## 2022-12-24 DIAGNOSIS — F1090 Alcohol use, unspecified, uncomplicated: Secondary | ICD-10-CM

## 2022-12-24 NOTE — Telephone Encounter (Signed)
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 She is coming tom. To get labs and we added the [facility] test along with the labs of cbc/d, and met c

## 2022-12-25 ENCOUNTER — Inpatient Hospital Stay: Payer: Commercial Managed Care - HMO

## 2022-12-25 DIAGNOSIS — Z171 Estrogen receptor negative status [ER-]: Secondary | ICD-10-CM

## 2022-12-25 DIAGNOSIS — R7401 Elevation of levels of liver transaminase levels: Secondary | ICD-10-CM

## 2022-12-25 DIAGNOSIS — Z853 Personal history of malignant neoplasm of breast: Secondary | ICD-10-CM | POA: Diagnosis not present

## 2022-12-25 DIAGNOSIS — Z95828 Presence of other vascular implants and grafts: Secondary | ICD-10-CM

## 2022-12-25 DIAGNOSIS — F1090 Alcohol use, unspecified, uncomplicated: Secondary | ICD-10-CM

## 2022-12-25 LAB — CBC WITH DIFFERENTIAL/PLATELET
Abs Immature Granulocytes: 0.03 10*3/uL (ref 0.00–0.07)
Basophils Absolute: 0.1 10*3/uL (ref 0.0–0.1)
Basophils Relative: 1 %
Eosinophils Absolute: 0.2 10*3/uL (ref 0.0–0.5)
Eosinophils Relative: 2 %
HCT: 37.7 % (ref 36.0–46.0)
Hemoglobin: 13 g/dL (ref 12.0–15.0)
Immature Granulocytes: 0 %
Lymphocytes Relative: 16 %
Lymphs Abs: 1.4 10*3/uL (ref 0.7–4.0)
MCH: 32.8 pg (ref 26.0–34.0)
MCHC: 34.5 g/dL (ref 30.0–36.0)
MCV: 95.2 fL (ref 80.0–100.0)
Monocytes Absolute: 0.8 10*3/uL (ref 0.1–1.0)
Monocytes Relative: 9 %
Neutro Abs: 6.2 10*3/uL (ref 1.7–7.7)
Neutrophils Relative %: 72 %
Platelets: 234 10*3/uL (ref 150–400)
RBC: 3.96 MIL/uL (ref 3.87–5.11)
RDW: 12.3 % (ref 11.5–15.5)
WBC: 8.6 10*3/uL (ref 4.0–10.5)
nRBC: 0 % (ref 0.0–0.2)

## 2022-12-25 LAB — COMPREHENSIVE METABOLIC PANEL
ALT: 32 U/L (ref 0–44)
AST: 34 U/L (ref 15–41)
Albumin: 4.4 g/dL (ref 3.5–5.0)
Alkaline Phosphatase: 91 U/L (ref 38–126)
Anion gap: 5 (ref 5–15)
BUN: 10 mg/dL (ref 6–20)
CO2: 23 mmol/L (ref 22–32)
Calcium: 8.5 mg/dL — ABNORMAL LOW (ref 8.9–10.3)
Chloride: 105 mmol/L (ref 98–111)
Creatinine, Ser: 0.7 mg/dL (ref 0.44–1.00)
GFR, Estimated: >60 mL/min (ref >60)
Glucose, Bld: 128 mg/dL — ABNORMAL HIGH (ref 70–99)
Potassium: 3.4 mmol/L — ABNORMAL LOW (ref 3.5–5.1)
Sodium: 133 mmol/L — ABNORMAL LOW (ref 135–145)
Total Bilirubin: 0.3 mg/dL (ref 0.3–1.2)
Total Protein: 7.3 g/dL (ref 6.5–8.1)

## 2022-12-25 MED ORDER — HEPARIN SOD (PORK) LOCK FLUSH 100 UNIT/ML IV SOLN
500.0000 [IU] | Freq: Once | INTRAVENOUS | Status: AC
  Administered 2022-12-25: 500 [IU] via INTRAVENOUS
  Filled 2022-12-25: qty 5

## 2022-12-25 MED ORDER — SODIUM CHLORIDE 0.9% FLUSH
10.0000 mL | Freq: Once | INTRAVENOUS | Status: AC
  Administered 2022-12-25: 10 mL via INTRAVENOUS
  Filled 2022-12-25: qty 10

## 2022-12-28 ENCOUNTER — Other Ambulatory Visit: Payer: Commercial Managed Care - HMO

## 2022-12-28 ENCOUNTER — Inpatient Hospital Stay: Payer: Commercial Managed Care - HMO

## 2022-12-28 ENCOUNTER — Ambulatory Visit: Payer: Commercial Managed Care - HMO | Admitting: Radiation Oncology

## 2022-12-29 ENCOUNTER — Encounter: Payer: Self-pay | Admitting: Occupational Therapy

## 2022-12-29 ENCOUNTER — Ambulatory Visit: Payer: Commercial Managed Care - HMO | Admitting: Occupational Therapy

## 2022-12-29 DIAGNOSIS — I972 Postmastectomy lymphedema syndrome: Secondary | ICD-10-CM | POA: Diagnosis not present

## 2022-12-29 NOTE — Therapy (Incomplete)
OUTPATIENT OCCUPATIONAL THERAPY  TREATMENT NOTE  [place] POST-MASTECTOMY LYMPHEDEMA  Patient Name: Vickie Mathis MRN: [id number] DOB:December 23, 1990, 32 y.o., female Today's Date: 12/29/2022  END OF SESSION:  OT End of Session - 12/29/22 1503     Visit Number 25    Number of Visits 36    Date for OT Re-Evaluation 02/22/23    OT Start Time 0100    OT Stop Time 0200    OT Time Calculation (min) 60 min    Activity Tolerance Patient tolerated treatment well;No increased pain    Behavior During Therapy WFL for tasks assessed/performed                   Past Medical History:  Diagnosis Date   Anxiety    Asthma    Breast cancer ([place]) 11/2020   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 AXILLARY NODE DISSECTION;  Surgeon: Herbert Pun, MD;  Location: [facility];  Service: General;  Laterality: Bilateral;  [place], 1.5 hours Cintron-Diaz 2.5 hours   BREAST BIOPSY Left 11/26/2020   vision 12:00 6cmfn [facility]   BREAST BIOPSY Left 11/26/2020   LN bx, hydro marker,  fragments of macrometastatic carcinoma   BREAST RECONSTRUCTION WITH PLACEMENT OF TISSUE EXPANDER AND FLEX HD (ACELLULAR HYDRATED DERMIS) Bilateral 06/25/2021   Procedure: BREAST RECONSTRUCTION WITH PLACEMENT OF TISSUE EXPANDER AND FLEX HD (ACELLULAR HYDRATED DERMIS);  Surgeon: Wallace Going, DO;  Location: [facility];  Service: Plastics;  Laterality: Bilateral;   PORTACATH PLACEMENT Right 12/13/2020   Procedure: INSERTION PORT-A-CATH;  Surgeon: Herbert Pun, MD;  Location: [facility];  Service: General;  Laterality: Right;   REMOVAL OF BILATERAL TISSUE  EXPANDERS WITH PLACEMENT OF BILATERAL BREAST IMPLANTS Bilateral 09/15/2021   Procedure: REMOVAL OF BILATERAL TISSUE EXPANDERS;  Surgeon: Wallace Going, DO;  Location: [place];  Service: Plastics;  Laterality: Bilateral;   Patient Active Problem List   Diagnosis Date Noted   Chemotherapy-induced neuropathy ([place]) 02/06/2022   Right sided weakness 02/06/2022   Transaminitis 11/20/2021   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 10/25/2021   Malignant neoplasm of left breast in female, estrogen receptor positive ([place]) 06/25/2021   Genetic testing 01/15/2021   Family history of cancer    Invasive carcinoma of breast ([place]) 12/03/2020   Breast mass, left 07/01/2020   Situational mixed anxiety and depressive disorder 07/01/2020   Anxiety state 08/06/2014   Varicose veins of lower extremity 08/06/2014    PCP: Juluis Pitch, MD   REFERRING PROVIDER: Sindy Guadeloupe, MD  REFERRING DIAG: 197.2  THERAPY DIAG: Post-mastectomy lymphedema syndrome  ONSET DATE: 9/22  SUBJECTIVE  SUBJECTIVE STATEMENT: Vickie Mathis presents to OT to address LUE/LUQ post-mastectomy lymphedema and suspected post-mastectomy pain syndrome of LUQ. Pt reports 3/10 pain in L upper quadrant this morning. "It's where the fullness is. It wraps around my chest and under my arm and back to my shoulder blade. It's almost numb, but it's not. It dull.  It hurts."   PERTINENT HISTORY: 2021 self palpated L breast mass. Diagnostic mammogram and ultrasound revealed 2.7 cm mass at 12 o'clock 6 cm from the nipple. L axillary ultrasound revealed 2 suspicious LN. Both breast mass and LN + for invasive mammary carcinoma grade 3, ER/PR and her 2 negative.R breast non-mass enhancement was negative for Br Ca.Pt underwent neoadjuvant  chemotherapy. 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 She underwent bilateral mastectomy with reconstruction in 9/22.Pathology revealed complete pathological response with 2 LN negative and R breast negative for malignancy. Breast implants removed 2/2 staff infection. Anxiety, chemo-induced neuropathy, intermittent L chest wall pain,   IMPAIRMENTS: chronic pain and L chest wall and trunk swelling ( post-mastectomy pain syndrome), extending across L  surgical site, through axilla and across lateral trunk, to L scapula since  bilateral mastectomy and ALND. 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 Impaired body image, disturbed sleep,  OTHER OBSTACLES TO OT LE CARE: limited transportation, limited financial resources ,  limited emotional support from spouse  PAIN:  Are you having pain? Yes NPRS scale: 3/10 Pain location: L and R lateral trunk and chest at mastectomy site, R lateral trunk adjacent to axilla PAIN TYPE: chronic Pain description: intermittent , sore, tender, tight numb Aggravating factors: lifting, carrying, pushing, pulling, reaching overhead Relieving factors: unknown   PRECAUTIONS: Other: lymphedema precautions   PATIENT GOALS: relieve post-mastectomy pain and limit LE progression   OBJECTIVE BLE COMPARATIVE LIMB VOLUMETRICS:   09/02/22 INITIAL LUE  LANDMARK LEFT  09/02/22  L MPs 20.5 cm  RUE (excluding hand)  2910.9 ml     Limb Volume differential (LVD)  %  Volume change since initial %  Volume change overall %  (Blank rows = not tested) :    LUE COMPARATIVE LIMB VOLUMETRICS:   09/02/22 10 th visit  LANDMARK LEFT  10/15/12   L MPs 20.5 cm  RUE  (excluding hand)  2910.9 ml     Limb Volume differential (LVD)  %  Volume change since initial LUE volume Increase 8.3 % since initially measured on 09/02/22  Volume change overall %  (Blank rows = not tested) :   LUE COMPARATIVE LIMB VOLUMETRICS:   12/02/22 19 th visit  LANDMARK LUE (19 th visit)  L MPs 20.4 cm  RUE (excluding hand)  3150.6 ml     Limb Volume differential (LVD)    Volume change since 10 th visit INC 0.25%  Volume change since initial INC 8.23%  (Blank rows = not tested) :   PATIENT SURVEYS:    LYMPHEDEMA LIFE IMPACT SCALE: (The extent to which lymphedema-related problems affected your life last week)  Intake score: 20.59%    TODAY'S TREATMENT:  MLD to LUE/LUQ and to R lateral trunk and chest wall Pt and family edu for LE self-care and progress towards all goals  PATIENT EDUCATION: Pt/ family education for basic and advanced sequential pneumatic compression devices, or "pump", including clinical indications, how they work, how the basic pneumatic "pump" differs from the advanced Flexitouch device, indications, contraindications and precautions and care and use routines for pneumatic compression devices. Patient's questions were answered throughout trial and handouts and Internet resources given for reference.  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 2 x daily PRN 2.  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 Belisse compression bra ( 39 a/b) and a  JoviPak double mastectomy pad for HOS to reduce fibrosis and facilitate improved lymphatic function to limit progression. 3. Daily skin care to sustain optimal hydration. Wash bites, scratches, blisters, etc with mild soap and water , apply antibacterial first aide cream and a band aide. 4. Simple self-Manual lymphatic drainage (MLD) PRN.  ASSESSMENT:  Pt continues to experience post-mastectomy pain syndrome. MLD and compression with foam insert provide mild relief, but condition remains unresolved. Arm swelling is mildly increased as activity level has increased, but arm swelling is minimal and stable at present. Pt will benefit from  daily use of an advanced, sequential, compression device the Flexitouch, for optimal, long term lymphedema self-management. This device follows lymphatic anatomy offloading congestion to either  adjacent alternative watershed/ quadrant selected. In Ms. Yarborough's case device should be programmed to direct fluid congestion towards adjacent , ipsilateral , axillary-inguinal anastomosis . In addition to lymphedema control this device is known to provide an analgesic effect  by reducing swelling that may exacerbate neuropathic pain. Pt would like to give explore the Flexitouch further. She gives verbal  OK to share her insurance info with the manufacturer's rep to check benefits coverage. Pt tolerated LUE/LUQ well today without increased pain. Cont 1-2 x weekly per POC.  CLINICAL IMPRESSION:  Pt Cont as per POC. .  OBJECTIVE IMPAIRMENTS: decreased knowledge of lymphedema ;  decreased knowledge of use of DME, decreased ROM, increased edema, impaired sensation, and pain with onset in   ACTIVITY LIMITATIONS: carrying, lifting, bed mobility, bathing, dressing, reach over head, and hygiene/grooming  PARTICIPATION LIMITATIONS: occupation  PERSONAL FACTORS:  Hx comorbid anxiety  is also affecting patient's functional outcome.   REHAB POTENTIAL: Good  CLINICAL DECISION MAKING:  Stable/uncomplicated   GOALS: Goals reviewed with patient? Yes  LONG TERM GOALS: Target date: 11/22/21   Given this patient's Intake score of 20.59/100% on the Lymphedema Life Impact Scale (LLIS), patient will experience a reduction of at least 5% in her perceived level of functional impairment resulting from lymphedema to improve functional performance and quality of life (QOL). Baseline: 20.59%  Goal status: 10/07/22 PROGRESSING; 12/08/22 Progressing  2.  Given this patient's Intake score of TBA/100% on the functional outcomes FOTO tool, patient will experience an increase in function of 5 points  to improve basic and instrumental ADLs performance, including lymphedema self-care. Baseline:  TBA Goal status: DEFERRED  3.   Pt will demonstrate understanding of lymphedema precautions and prevention strategies with modified independence using a printed reference to identify at least 5 precautions and discussing  how s/he may implement them into daily life to reduce risk of progression and to limit infection risk. Baseline: Max A Goal status:10/07/22 GOAL MET  4.  Pt will be able to don and doff prophylactic compression arm sleeve using assistive devices and extra time (modified independent), and discuss wear and garment care recommendations  to limit lymphedema by issue date. Baseline: dependent Goal status: 09/15/22 GOAL MET  5.  Pt will demonstrate the ability to perform all lymphedema self-care home program components with printed reference sheets (modified independence) by DC to limit lymphedema progression Baseline: dependent Goal status: 10/07/22 PROGRESSING. 12/09/22 PROGRESSING. Awaiting custom compression bra and convoluted foam inserts for HOS.  6.  Pt will report 0/10 shoulder pain with AROM in all planes at end ranges bilaterally against gravity by DC for optimal functional performance of basic and instrumental ADLs, work and productive activities, leisure pursuits and social  participation, Baseline: 2/10 Goal status: 10/07/22 PROGRESSING. 12/08/22 Ongoing.  PLAN:  PT FREQUENCY: 1-2x/week  PT DURATION: 12 weeks and PRN  PLANNED INTERVENTIONS: Therapeutic exercises, Therapeutic activity, Neuromuscular re-education, Balance training, Gait training, Patient/Family education, Self Care, and Joint mobilization  PLAN FOR NEXT SESSION:  Cont LUE/LUQ MLD , scar massage and myofascial release. Cont L trunk and chest wall Kinesiotape as tolerated Continue teaching LE self care Progress report  Andrey Spearman, MS, OTR/L, CLT-LANA 12/29/22 3:04 PM

## 2022-12-31 ENCOUNTER — Ambulatory Visit: Payer: Commercial Managed Care - HMO | Admitting: Occupational Therapy

## 2022-12-31 LAB — MISC LABCORP TEST (SEND OUT): Labcorp test code: 791584

## 2023-01-01 ENCOUNTER — Telehealth: Payer: Self-pay | Admitting: *Deleted

## 2023-01-01 NOTE — Telephone Encounter (Signed)
Denny Peon called asking that the order that we should have received for patient Vickie Mathis be signed and returned to [facility] [facility] [id number]. She said she had discussed this with Judeen Hammans previously.

## 2023-01-05 ENCOUNTER — Ambulatory Visit: Payer: Commercial Managed Care - HMO | Attending: Oncology | Admitting: Occupational Therapy

## 2023-01-05 DIAGNOSIS — I972 Postmastectomy lymphedema syndrome: Secondary | ICD-10-CM | POA: Insufficient documentation

## 2023-01-07 ENCOUNTER — Encounter: Payer: Commercial Managed Care - HMO | Admitting: Occupational Therapy

## 2023-01-07 NOTE — Therapy (Signed)
OUTPATIENT OCCUPATIONAL THERAPY  TREATMENT NOTE  [place] POST-MASTECTOMY LYMPHEDEMA  Patient Name: Vickie Mathis MRN: [id number] DOB:[birth year], 32 y.o., female Today's Date: 01/07/2023  END OF SESSION:  Visit Number 26     Number of Visits 36     Date for OT Re-Evaluation 02/22/23     OT Start Time 0100     OT Stop Time 0200     OT Time Calculation (min) 60 min     Activity Tolerance Patient tolerated treatment well;No increased pain     Behavior During Therapy WFL for tasks assessed/performed               Past Medical History:  Diagnosis Date   Anxiety    Asthma    Breast cancer ([place]) 11/2020   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 AXILLARY NODE DISSECTION;  Surgeon: Herbert Pun, MD;  Location: [facility];  Service: General;  Laterality: Bilateral;  [place], 1.5 hours Cintron-Diaz 2.5 hours   BREAST BIOPSY Left 11/26/2020   vision 12:00 6cmfn [facility]   BREAST BIOPSY Left 11/26/2020   LN bx, hydro marker,  fragments of macrometastatic carcinoma   BREAST RECONSTRUCTION WITH PLACEMENT OF TISSUE EXPANDER AND FLEX HD (ACELLULAR HYDRATED DERMIS) Bilateral 06/25/2021   Procedure: BREAST RECONSTRUCTION WITH PLACEMENT OF TISSUE EXPANDER AND FLEX HD (ACELLULAR HYDRATED DERMIS);  Surgeon: Wallace Going, DO;  Location: [facility];  Service: Plastics;  Laterality: Bilateral;   PORTACATH PLACEMENT Right 12/13/2020   Procedure: INSERTION PORT-A-CATH;  Surgeon: Herbert Pun, MD;  Location: [facility];  Service: General;  Laterality: Right;   REMOVAL OF BILATERAL TISSUE EXPANDERS WITH PLACEMENT OF BILATERAL  BREAST IMPLANTS Bilateral 09/15/2021   Procedure: REMOVAL OF BILATERAL TISSUE EXPANDERS;  Surgeon: Wallace Going, DO;  Location: [place];  Service: Plastics;  Laterality: Bilateral;   Patient Active Problem List   Diagnosis Date Noted   Chemotherapy-induced neuropathy 02/06/2022   Right sided weakness 02/06/2022   Transaminitis 11/20/2021   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 10/25/2021   Malignant neoplasm of left breast in female, estrogen receptor positive 06/25/2021   Genetic testing 01/15/2021   Family history of cancer    Invasive carcinoma of breast 12/03/2020   Breast mass, left 07/01/2020   Situational mixed anxiety and depressive disorder 07/01/2020   Anxiety state 08/06/2014   Varicose veins of lower extremity 08/06/2014    PCP: Juluis Pitch, MD   REFERRING PROVIDER: Sindy Guadeloupe, MD  REFERRING DIAG: 197.2  THERAPY DIAG: Post-mastectomy lymphedema syndrome  ONSET DATE: 9/22  SUBJECTIVE  SUBJECTIVE STATEMENT: Vickie Mathis presents to OT to address LUE/LUQ post-mastectomy lymphedema and post-mastectomy pain syndrome of LUQ. Pt reported pain of 3/10 pain in L upper quadrants, including surgical sites bilaterally, lateral R and L trunk  is unchanged since last session. "It's where the pockets of swelling are. It feels like fullness. It wraps around my chest and under my arm and back to my shoulder blades. It's almost numb, but it's not numb. It dull.  It hurts."   PERTINENT HISTORY: 2021 self palpated L breast mass. Diagnostic mammogram and ultrasound revealed 2.7 cm mass at 12 o'clock 6 cm from the nipple. L axillary ultrasound revealed 2 suspicious LN. Both breast mass and LN + for invasive mammary carcinoma grade 3, ER/PR and her 2 negative.R breast  non-mass enhancement was negative for Br Ca.Pt underwent neoadjuvant chemotherapy. 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 She underwent bilateral mastectomy with reconstruction in 9/22.Pathology revealed complete pathological response with 2 LN negative and R breast negative for malignancy. Breast implants removed 2/2 staff infection. Anxiety, chemo-induced neuropathy, intermittent L chest wall pain,   IMPAIRMENTS: chronic pain and L chest wall and trunk swelling ( post-mastectomy pain syndrome), extending across L  surgical site, through axilla and across lateral trunk, to L scapula since  bilateral mastectomy and ALND. 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 Impaired body image, disturbed sleep,  OTHER OBSTACLES TO OT LE CARE: limited transportation, limited financial resources ,  limited emotional support from spouse  PAIN:  Are you having pain? Yes NPRS scale: 3/10 Pain location: L and R lateral trunk and chest at mastectomy site, R and L lateral trunk adjacent to axilla PAIN TYPE: chronic Pain description: intermittent , sore, tender, tight numb Aggravating factors: lifting, carrying, pushing, pulling, reaching overhead Relieving factors: unknown   PRECAUTIONS: Other: lymphedema precautions   PATIENT GOALS: relieve post-mastectomy pain and limit LE progression   OBJECTIVE BLE COMPARATIVE LIMB VOLUMETRICS:   09/02/22 INITIAL LUE  LANDMARK LEFT  09/02/22  L MPs 20.5 cm  RUE (excluding hand)  2910.9 ml     Limb Volume differential (LVD)  %  Volume change since initial %  Volume change overall %  (Blank rows = not tested) :    LUE COMPARATIVE LIMB VOLUMETRICS:    09/02/22 10 th visit  LANDMARK LEFT  10/15/12   L MPs 20.5 cm  RUE (excluding hand)  2910.9 ml     Limb Volume differential (LVD)  %  Volume change since initial LUE volume Increase 8.3 % since initially measured on 09/02/22  Volume change overall %  (Blank rows = not tested) :   LUE COMPARATIVE LIMB VOLUMETRICS:   12/02/22 19 th visit  LANDMARK LUE (19 th visit)  L MPs 20.4 cm  RUE (excluding hand)  3150.6 ml     Limb Volume differential (LVD)    Volume change since 10 th visit INC 0.25%  Volume change since initial INC 8.23%  (Blank rows = not tested) :   PATIENT SURVEYS:    LYMPHEDEMA LIFE IMPACT SCALE: (The extent to which lymphedema-related problems affected your life last week)  Intake score: 20.59%    TODAY'S TREATMENT:  MLD w myofascial release to LUE/LUQ PATIENT EDUCATION: Continued Pt/ CG edu for lymphedema self care home program throughout session. Topics include outcome of comparative limb volumetrics- starting limb volume differentials (LVDs), technology and gradient techniques used for short stretch, multilayer compression wrapping, simple self-MLD, therapeutic lymphatic pumping exercises, skin/nail care, LE precautions,. compression garment recommendations and specifications, wear and care schedule and compression garment donning / doffing w assistive devices. Discussed progress towards all OT goals since commencing CDT. All questions answered to the Pt's satisfaction. Good return.  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 2 x daily PRN 2.  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 Belisse compression bra ( 39 a/b) and a JoviPak double mastectomy pad for HOS to reduce fibrosis and facilitate improved lymphatic function to limit progression. 3. Daily skin care to sustain optimal hydration. Wash bites, scratches, blisters, etc with mild soap and water , apply antibacterial first aide cream and a band aide. 4. Simple self-Manual lymphatic drainage (MLD) PRN.  ASSESSMENT: Pt presenting with ongoing pain in LUQ rated 3/10 today. 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 Pt continues to tolerate manual therapy and gets an analgesic effect from MLD post Rx, but chest and lateral trunk pain remains intractable. She underwent trial of the advanced sequential pneumatic compression device from [employer], which has a vest also addressing the affected upper quadrant. We're hopeful that this device will be approved by insurance and device is used daily for desensitization and pain reduction. Pt tolerated MLD, myofacial release and scar massage today without increased pain.  Cont at current frequency until  after Flexitouch training is complete and Pt has habituated to device.  CLINICAL IMPRESSION:  Pt Cont as per POC. .  OBJECTIVE IMPAIRMENTS: decreased knowledge of lymphedema ;  decreased knowledge of use of DME, decreased ROM, increased edema, impaired sensation, and pain with onset in   ACTIVITY LIMITATIONS: carrying, lifting, bed mobility, bathing, dressing, reach over head, and hygiene/grooming  PARTICIPATION LIMITATIONS: occupation  PERSONAL FACTORS:  Hx comorbid anxiety  is also affecting patient's functional outcome.   REHAB POTENTIAL: Good  CLINICAL DECISION MAKING: Stable/uncomplicated   GOALS: Goals reviewed with  patient? Yes  LONG TERM GOALS: Target date: 11/22/21   Given this patient's Intake score of 20.59/100% on the Lymphedema Life Impact Scale (LLIS), patient will experience a reduction of at least 5% in her perceived level of functional impairment resulting from lymphedema to improve functional performance and quality of life (QOL). Baseline: 20.59%  Goal status: 10/07/22 PROGRESSING; 12/08/22 Progressing  2.  Given this patient's Intake score of TBA/100% on the functional outcomes FOTO tool, patient will experience an increase in function of 5 points  to improve basic and instrumental ADLs performance, including lymphedema self-care. Baseline:  TBA Goal status: DEFERRED  3.   Pt will demonstrate understanding of lymphedema precautions and prevention strategies with modified independence using a printed reference to identify at least 5 precautions and discussing how s/he may implement them into daily life to reduce risk of progression and to limit infection risk. Baseline:  Max A Goal status:10/07/22 GOAL MET  4.  Pt will be able to don and doff prophylactic compression arm sleeve using assistive devices and extra time (modified independent), and discuss wear and garment care recommendations  to limit lymphedema by issue date. Baseline: dependent Goal status: 09/15/22 GOAL MET  5.  Pt will demonstrate the ability to perform all lymphedema self-care home program components with printed reference sheets (modified independence) by DC to limit lymphedema progression Baseline: dependent Goal status: 12/09/22 PROGRESSING. Awaiting approval of advanced Flexitouch Plus  6.  Pt will report 0/10 shoulder pain with AROM in all planes at end ranges bilaterally against gravity by DC for optimal functional performance of basic and instrumental ADLs, work and productive activities, leisure pursuits and social participation, Baseline: 2-3/10 Goal status: 12/29/22 ONGOING  PLAN:  PT FREQUENCY: 1-2x/week  PT  DURATION: 12 weeks and PRN  PLANNED INTERVENTIONS: Therapeutic exercises, Therapeutic activity, Neuromuscular re-education, Balance training, Gait training, Patient/Family education, Self Care, and Joint mobilization  PLAN FOR NEXT SESSION:  Cont LUE/LUQ MLD , scar massage and myofascial release. Cont L trunk and chest wall Kinesiotape as tolerated Continue teaching LE self care Progress report  Andrey Spearman, MS, OTR/L, CLT-LANA 01/07/23 12:00 PM

## 2023-01-08 ENCOUNTER — Inpatient Hospital Stay: Payer: Commercial Managed Care - HMO | Attending: Oncology

## 2023-01-08 ENCOUNTER — Inpatient Hospital Stay: Payer: Commercial Managed Care - HMO

## 2023-01-08 ENCOUNTER — Other Ambulatory Visit: Payer: Self-pay | Admitting: *Deleted

## 2023-01-08 DIAGNOSIS — Z171 Estrogen receptor negative status [ER-]: Secondary | ICD-10-CM

## 2023-01-08 DIAGNOSIS — Z853 Personal history of malignant neoplasm of breast: Secondary | ICD-10-CM | POA: Diagnosis not present

## 2023-01-08 DIAGNOSIS — R7401 Elevation of levels of liver transaminase levels: Secondary | ICD-10-CM

## 2023-01-08 DIAGNOSIS — Z923 Personal history of irradiation: Secondary | ICD-10-CM | POA: Diagnosis not present

## 2023-01-08 LAB — COMPREHENSIVE METABOLIC PANEL
ALT: 41 U/L (ref 0–44)
AST: 42 U/L — ABNORMAL HIGH (ref 15–41)
Albumin: 4.6 g/dL (ref 3.5–5.0)
Alkaline Phosphatase: 88 U/L (ref 38–126)
Anion gap: 7 (ref 5–15)
BUN: 5 mg/dL — ABNORMAL LOW (ref 6–20)
CO2: 25 mmol/L (ref 22–32)
Calcium: 9 mg/dL (ref 8.9–10.3)
Chloride: 103 mmol/L (ref 98–111)
Creatinine, Ser: 0.69 mg/dL (ref 0.44–1.00)
GFR, Estimated: >60 mL/min (ref >60)
Glucose, Bld: 92 mg/dL (ref 70–99)
Potassium: 3.7 mmol/L (ref 3.5–5.1)
Sodium: 135 mmol/L (ref 135–145)
Total Bilirubin: 0.5 mg/dL (ref 0.3–1.2)
Total Protein: 7.6 g/dL (ref 6.5–8.1)

## 2023-01-08 LAB — CBC WITH DIFFERENTIAL/PLATELET
Abs Immature Granulocytes: 0.02 10*3/uL (ref 0.00–0.07)
Basophils Absolute: 0.1 10*3/uL (ref 0.0–0.1)
Basophils Relative: 1 %
Eosinophils Absolute: 0.2 10*3/uL (ref 0.0–0.5)
Eosinophils Relative: 2 %
HCT: 40.7 % (ref 36.0–46.0)
Hemoglobin: 13.8 g/dL (ref 12.0–15.0)
Immature Granulocytes: 0 %
Lymphocytes Relative: 17 %
Lymphs Abs: 1.5 10*3/uL (ref 0.7–4.0)
MCH: 32.2 pg (ref 26.0–34.0)
MCHC: 33.9 g/dL (ref 30.0–36.0)
MCV: 94.9 fL (ref 80.0–100.0)
Monocytes Absolute: 0.6 10*3/uL (ref 0.1–1.0)
Monocytes Relative: 7 %
Neutro Abs: 6.3 10*3/uL (ref 1.7–7.7)
Neutrophils Relative %: 73 %
Platelets: 225 10*3/uL (ref 150–400)
RBC: 4.29 MIL/uL (ref 3.87–5.11)
RDW: 12.2 % (ref 11.5–15.5)
WBC: 8.7 10*3/uL (ref 4.0–10.5)
nRBC: 0 % (ref 0.0–0.2)

## 2023-01-12 ENCOUNTER — Ambulatory Visit: Payer: Commercial Managed Care - HMO | Admitting: Occupational Therapy

## 2023-01-13 ENCOUNTER — Encounter: Payer: Self-pay | Admitting: Radiation Oncology

## 2023-01-13 ENCOUNTER — Encounter: Payer: Self-pay | Admitting: *Deleted

## 2023-01-13 ENCOUNTER — Ambulatory Visit
Admission: RE | Admit: 2023-01-13 | Discharge: 2023-01-13 | Disposition: A | Discharge status: Home / Self Care | Payer: Commercial Managed Care - HMO | Source: Ambulatory Visit | Attending: Radiation Oncology | Admitting: Radiation Oncology

## 2023-01-13 ENCOUNTER — Inpatient Hospital Stay: Payer: Commercial Managed Care - HMO

## 2023-01-13 VITALS — BP 116/70 | HR 82 | Temp 98.0°F | Resp 16 | Ht 69.0 in | Wt 206.4 lb

## 2023-01-13 DIAGNOSIS — C50412 Malignant neoplasm of upper-outer quadrant of left female breast: Secondary | ICD-10-CM

## 2023-01-13 DIAGNOSIS — Z923 Personal history of irradiation: Secondary | ICD-10-CM | POA: Insufficient documentation

## 2023-01-13 DIAGNOSIS — Z853 Personal history of malignant neoplasm of breast: Secondary | ICD-10-CM | POA: Insufficient documentation

## 2023-01-13 DIAGNOSIS — Z171 Estrogen receptor negative status [ER-]: Secondary | ICD-10-CM

## 2023-01-13 NOTE — Progress Notes (Signed)
Radiation Oncology Follow up Note  Name: Vickie Mathis   Date:   01/13/2023 MRN:  [id number] DOB: [birth year]    This 32 y.o. 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  Seen today in routine follow-up she is doing fairly well.  She recently had a fall had an MRI of her head which was negative..  She specifically denies any chest wall masses or nodularity cough or bone pain.  COMPLICATIONS OF TREATMENT: none  FOLLOW UP COMPLIANCE: keeps appointments   PHYSICAL EXAM:  BP 116/70   Pulse 82   Temp 98 F (36.7 C)   Resp 16   Ht 5\' 9"  (1.753 m)   Wt 206 lb 6.4 oz (93.6 kg)   BMI 30.48 kg/m  Patient is status post bilateral mastectomies no evidence of chest wall mass or nodularity identified no axillary or supraclavicular adenopathy is noted.  No evidence of lymphedema in her upper extremities is noted.  Well-developed well-nourished patient in NAD. HEENT reveals PERLA, EOMI, discs not visualized.  Oral cavity is clear. No oral mucosal lesions are identified. Neck is clear without evidence of cervical or supraclavicular adenopathy. Lungs are clear to A&P. Cardiac examination is essentially unremarkable with regular rate and rhythm without murmur rub or thrill. Abdomen is benign with no organomegaly or masses noted. Motor sensory and DTR levels are equal and symmetric in the upper and lower extremities. Cranial nerves II through XII are grossly intact. Proprioception is intact. No peripheral adenopathy or edema is identified. No motor or sensory levels are  noted. Crude visual fields are within normal range.  RADIOLOGY RESULTS: No current films for review  PLAN: Present time patient is doing well with no evidence of disease now 1 year out.  Since she has no breast exam to be performed I am going to turn follow-up care over to medical oncology.  I be happy to reevaluate this patient in the future should that be indicated.  She knows to call with any concerns.  She is not on endocrine endocrine therapy based on the triple negative nature of her disease.  Patient comprehends my recommendations well.  I would like to take this opportunity to thank you for allowing me to participate in the care of your patient.Carmina Miller, MD

## 2023-01-14 ENCOUNTER — Encounter: Payer: Commercial Managed Care - HMO | Admitting: Occupational Therapy

## 2023-01-14 ENCOUNTER — Ambulatory Visit: Payer: Commercial Managed Care - HMO | Admitting: Occupational Therapy

## 2023-01-14 DIAGNOSIS — I972 Postmastectomy lymphedema syndrome: Secondary | ICD-10-CM

## 2023-01-14 NOTE — Therapy (Signed)
OUTPATIENT OCCUPATIONAL THERAPY  TREATMENT NOTE  LUE/LUQ POST-MASTECTOMY LYMPHEDEMA  Patient Name: Vickie Mathis MRN: [id number] DOB:May 23, 1991, 32 y.o.,, female Today's Date: 01/15/2023  END OF SESSION:   OT End of Session - 01/14/23 1416     Visit Number 27    Number of Visits 36    Date for OT Re-Evaluation 02/22/23    OT Start Time 0200    OT Stop Time 0305    OT Time Calculation (min) 65 min    Activity Tolerance Patient tolerated treatment well;No increased pain    Behavior During Therapy WFL for tasks assessed/performed             Past Medical History:  Diagnosis Date   Anxiety    Asthma    Breast cancer 11/2020   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 AXILLARY NODE DISSECTION;  Surgeon: Carolan Shiver, MD;  Location: [facility];  Service: General;  Laterality: Bilateral;  [place], 1.5 hours Cintron-Diaz 2.5 hours   BREAST BIOPSY Left 11/26/2020   vision 12:00 6cmfn [facility]   BREAST BIOPSY Left 11/26/2020   LN bx, hydro marker,  fragments of macrometastatic carcinoma   BREAST RECONSTRUCTION WITH PLACEMENT OF TISSUE EXPANDER AND FLEX HD (ACELLULAR HYDRATED DERMIS) Bilateral 06/25/2021   Procedure: BREAST RECONSTRUCTION WITH PLACEMENT OF TISSUE EXPANDER AND FLEX HD (ACELLULAR HYDRATED DERMIS);  Surgeon: Peggye Form, DO;  Location: [facility];  Service: Plastics;  Laterality: Bilateral;   PORTACATH PLACEMENT Right 12/13/2020   Procedure: INSERTION PORT-A-CATH;  Surgeon: Carolan Shiver, MD;  Location: [facility];  Service: General;  Laterality: Right;   REMOVAL OF BILATERAL TISSUE EXPANDERS WITH  PLACEMENT OF BILATERAL BREAST IMPLANTS Bilateral 09/15/2021   Procedure: REMOVAL OF BILATERAL TISSUE EXPANDERS;  Surgeon: Peggye Form, DO;  Location: [facility];  Service: Plastics;  Laterality: Bilateral;   Patient Active Problem List   Diagnosis Date Noted   Chemotherapy-induced neuropathy 02/06/2022   Right sided weakness 02/06/2022   Transaminitis 11/20/2021   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 10/25/2021   Malignant neoplasm of left breast in female, estrogen receptor positive 06/25/2021   Genetic testing 01/15/2021   Family history of cancer    Invasive carcinoma of breast 12/03/2020   Breast mass, left 07/01/2020   Situational mixed anxiety and depressive disorder 07/01/2020   Anxiety state 08/06/2014   Varicose veins of lower extremity 08/06/2014    PCP: Dorothey Baseman, MD   REFERRING PROVIDER: Creig Hines, MD  REFERRING DIAG: 197.2  THERAPY DIAG: Post-mastectomy lymphedema syndrome  ONSET DATE: 9/22  SUBJECTIVE  SUBJECTIVE STATEMENT: Vickie Mathis presents to OT to address LUE/LUQ post-mastectomy lymphedema and post-mastectomy pain syndrome of LUQ. Pt reported pain of 3/10 pain in L upper quadrants, including surgical sites bilaterally, lateral R and L trunk  is unchanged since last session. "It's where the pockets of swelling are. It feels like fullness. It wraps around my chest and under my arm and back to my shoulder blades. It's almost numb, but it's not numb. It dull.  It hurts."   PERTINENT HISTORY: 2021 self palpated L breast mass. Diagnostic mammogram and ultrasound revealed 2.7 cm mass at 12 o'clock 6 cm from the nipple. L axillary ultrasound revealed 2 suspicious LN. Both breast mass and LN + for invasive mammary carcinoma grade 3, ER/PR and her  2 negative.R breast non-mass enhancement was negative for Br Ca.Pt underwent neoadjuvant chemotherapy. 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 She underwent bilateral mastectomy with reconstruction in 9/22.Pathology revealed complete pathological response with 2 LN negative and R breast negative for malignancy. Breast implants removed 2/2 staff infection. Anxiety, chemo-induced neuropathy, intermittent L chest wall pain,   IMPAIRMENTS: chronic pain and L chest wall and trunk swelling ( post-mastectomy pain syndrome), extending across L  surgical site, through axilla and across lateral trunk, to L scapula since  bilateral mastectomy and ALND. 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 Impaired body image, disturbed sleep,  OTHER OBSTACLES TO OT LE CARE: limited transportation, limited financial resources ,  limited emotional support from spouse  PAIN:  Are you having pain? Yes NPRS scale: 3/10 Pain location: L and R lateral trunk and chest at mastectomy site, R and L lateral trunk adjacent to axilla PAIN TYPE: chronic Pain description: intermittent , sore, tender, tight numb Aggravating factors: lifting, carrying, pushing, pulling, reaching overhead Relieving factors: unknown   PRECAUTIONS: Other: lymphedema precautions   PATIENT GOALS: relieve post-mastectomy pain and limit LE progression   OBJECTIVE BLE COMPARATIVE LIMB VOLUMETRICS:   09/02/22 INITIAL LUE  LANDMARK LEFT  09/02/22  L MPs 20.5 cm  RUE (excluding hand)  2910.9 ml     Limb Volume differential (LVD)  %  Volume change since initial %  Volume change overall %  (Blank rows = not tested) :    LUE  COMPARATIVE LIMB VOLUMETRICS:   09/02/22 10 th visit  LANDMARK LEFT  10/15/12   L MPs 20.5 cm  RUE (excluding hand)  2910.9 ml     Limb Volume differential (LVD)  %  Volume change since initial LUE volume Increase 8.3 % since initially measured on 09/02/22  Volume change overall %  (Blank rows = not tested) :   LUE COMPARATIVE LIMB VOLUMETRICS:   12/02/22 19 th visit  LANDMARK LUE (19 th visit)  L MPs 20.4 cm  RUE (excluding hand)  3150.6 ml     Limb Volume differential (LVD)    Volume change since 10 th visit INC 0.25%  Volume change since initial INC 8.23%  (Blank rows = not tested) :   PATIENT SURVEYS:    LYMPHEDEMA LIFE IMPACT SCALE: (The extent to which lymphedema-related problems affected your life last week)  Intake score: 20.59%    TODAY'S TREATMENT:  MLD w myofascial release to LUE/LUQ PATIENT EDUCATION: Continued Pt/ CG edu for lymphedema self care home program throughout session. Topics include outcome of comparative limb volumetrics- starting limb volume differentials (LVDs), technology and gradient techniques used for short stretch, multilayer compression wrapping, simple self-MLD, therapeutic lymphatic pumping exercises, skin/nail care, LE precautions,. compression garment recommendations and specifications, wear and care schedule and compression garment donning / doffing w assistive devices. Discussed progress towards all OT goals since commencing CDT. All questions answered to the Pt's satisfaction. Good return.  Person educated: Patientand spouse, Psychologist, prison and probation services method: Handouts, demonstration Education comprehension: verbalized understanding, returned demonstration, and needs further education  HOME EXERCISE PROGRAM: 1.Therapeutic lymphatic pumping therex- 2 sets of 10 reps, hold 5 seconds; elements in order. 2 x daily PRN 2.   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 Belisse compression bra ( 39 a/b) and a JoviPak double mastectomy pad for HOS to reduce fibrosis and facilitate improved lymphatic function to limit progression. 3. Daily skin care to sustain optimal hydration. Wash bites, scratches, blisters, etc with mild soap and water , apply antibacterial first aide cream and a band aide. 4. Simple self-Manual lymphatic drainage (MLD) PRN.  ASSESSMENT: Pt presenting with ongoing pain in LUQ rated 3/10 . 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 Pt continues to tolerate manual therapy and gets an analgesic effect from MLD post Rx, but chest and lateral trunk pain remains intractable. Flexitouch was delivered and Pt is awaiting scheduling with a trainer from [employer] to assist w set up in her home and training for device use. We are hoping the device, used daily for lymphatic stimulation and desensitization, will reduce pain further in the trunk and chest wall., as well as facilitate improved lymphatic flow through scar tissue and radiation fibrosis.  Cont at current frequency until  after Flexitouch training is complete and Pt has habituated to device.  CLINICAL IMPRESSION:  Pt Cont as per POC. .  OBJECTIVE IMPAIRMENTS: decreased knowledge of lymphedema ;  decreased knowledge of use of DME, decreased ROM, increased edema, impaired sensation, and pain with onset in   ACTIVITY LIMITATIONS: carrying, lifting, bed mobility, bathing, dressing, reach over head, and hygiene/grooming  PARTICIPATION LIMITATIONS: occupation  PERSONAL FACTORS:  Hx comorbid anxiety  is also affecting patient's functional outcome.   REHAB POTENTIAL: Good  CLINICAL DECISION MAKING:  Stable/uncomplicated   GOALS: Goals reviewed with patient? Yes  LONG TERM GOALS: Target date: 11/22/21   Given this patient's Intake score of 20.59/100% on the Lymphedema Life Impact Scale (LLIS), patient will experience a reduction of at least 5% in her perceived level of functional impairment resulting from lymphedema to improve functional performance and quality of life (QOL). Baseline: 20.59%  Goal status: 10/07/22 PROGRESSING; 12/08/22 Progressing  2.  Given this patient's Intake score of TBA/100% on the functional outcomes FOTO tool, patient will experience an increase in function of 5 points  to improve basic and instrumental ADLs performance, including lymphedema self-care. Baseline:  TBA Goal status: DEFERRED  3.   Pt will demonstrate understanding of lymphedema precautions and prevention strategies with modified independence using a printed reference to identify at least 5 precautions and discussing how s/he may implement them into daily life to reduce risk  of progression and to limit infection risk. Baseline: Max A Goal status:10/07/22 GOAL MET  4.  Pt will be able to don and doff prophylactic compression arm sleeve using assistive devices and extra time (modified independent), and discuss wear and garment care recommendations  to limit lymphedema by issue date. Baseline: dependent Goal status: 09/15/22 GOAL MET  5.  Pt will demonstrate the ability to perform all lymphedema self-care home program components with printed reference sheets (modified independence) by DC to limit lymphedema progression Baseline: dependent Goal status: 12/09/22 PROGRESSING. Awaiting approval of advanced Flexitouch Plus  6.  Pt will report 0/10 shoulder pain with AROM in all planes at end ranges bilaterally against gravity by DC for optimal functional performance of basic and instrumental ADLs, work and productive activities, leisure pursuits and social participation, Baseline: 2-3/10 Goal status: 12/29/22  ONGOING  PLAN:  PT FREQUENCY: 1-2x/week  PT DURATION: 12 weeks and PRN  PLANNED INTERVENTIONS: Therapeutic exercises, Therapeutic activity, Neuromuscular re-education, Balance training, Gait training, Patient/Family education, Self Care, and Joint mobilization  PLAN FOR NEXT SESSION:  Cont LUE/LUQ MLD , scar massage and myofascial release. Cont L trunk and chest wall Kinesiotape as tolerated Continue teaching LE self care Progress report  Loel Dubonnetheresa Shelie Lansing, MS, OTR/L, CLT-LANA 01/15/23 10:02 AM

## 2023-01-19 ENCOUNTER — Ambulatory Visit: Payer: Commercial Managed Care - HMO | Admitting: Occupational Therapy

## 2023-01-19 DIAGNOSIS — I972 Postmastectomy lymphedema syndrome: Secondary | ICD-10-CM | POA: Diagnosis not present

## 2023-01-19 NOTE — Therapy (Signed)
OUTPATIENT OCCUPATIONAL THERAPY  TREATMENT NOTE  LUE/LUQ POST-MASTECTOMY LYMPHEDEMA  Patient Name: Vickie Mathis MRN: [id number] DOB:[birth year], 32 y.o., female Today's Date: 01/19/2023  END OF SESSION:   OT End of Session - 01/19/23 1313     Visit Number 28    Number of Visits 36    Date for OT Re-Evaluation 02/22/23    OT Start Time 0104    OT Stop Time 0205    OT Time Calculation (min) 61 min    Activity Tolerance Patient tolerated treatment well;No increased pain    Behavior During Therapy WFL for tasks assessed/performed             Past Medical History:  Diagnosis Date   Anxiety    Asthma    Breast cancer 11/2020   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 AXILLARY NODE DISSECTION;  Surgeon: Vickie Shiver, MD;  Location: [facility];  Service: General;  Laterality: Bilateral;  Vickie Mathis, 1.5 hours Vickie Mathis 2.5 hours   BREAST BIOPSY Left 11/26/2020   vision 12:00 6cmfn [facility]   BREAST BIOPSY Left 11/26/2020   LN bx, hydro marker,  fragments of macrometastatic carcinoma   BREAST RECONSTRUCTION WITH PLACEMENT OF TISSUE EXPANDER AND FLEX HD (ACELLULAR HYDRATED DERMIS) Bilateral 06/25/2021   Procedure: BREAST RECONSTRUCTION WITH PLACEMENT OF TISSUE EXPANDER AND FLEX HD (ACELLULAR HYDRATED DERMIS);  Surgeon: Vickie Form, DO;  Location: [facility];  Service: Plastics;  Laterality: Bilateral;   PORTACATH PLACEMENT Right 12/13/2020   Procedure: INSERTION PORT-A-CATH;  Surgeon: Vickie Shiver, MD;  Location: [facility];  Service: General;  Laterality: Right;   REMOVAL OF BILATERAL TISSUE EXPANDERS WITH  PLACEMENT OF BILATERAL BREAST IMPLANTS Bilateral 09/15/2021   Procedure: REMOVAL OF BILATERAL TISSUE EXPANDERS;  Surgeon: Vickie Form, DO;  Location: [facility];  Service: Plastics;  Laterality: Bilateral;   Patient Active Problem List   Diagnosis Date Noted   Chemotherapy-induced neuropathy 02/06/2022   Right sided weakness 02/06/2022   Transaminitis 11/20/2021   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 10/25/2021   Malignant neoplasm of left breast in female, estrogen receptor positive 06/25/2021   Genetic testing 01/15/2021   Family history of cancer    Invasive carcinoma of breast 12/03/2020   Breast mass, left 07/01/2020   Situational mixed anxiety and depressive disorder 07/01/2020   Anxiety state 08/06/2014   Varicose veins of lower extremity 08/06/2014    PCP: Vickie Baseman, MD   REFERRING PROVIDER: Creig Hines, MD  REFERRING DIAG: 197.2  THERAPY DIAG: Post-mastectomy lymphedema syndrome  ONSET DATE: 9/22  SUBJECTIVE  SUBJECTIVE STATEMENT: Vickie Mathis presents to OT to address LUE/LUQ post-mastectomy lymphedema and post-mastectomy pain syndrome of LUQ. Pt reported pain of 3/10 pain in L upper quadrants, including surgical sites bilaterally, lateral R and L trunk  is unchanged since last session. "It's where the pockets of swelling are. It feels like fullness. It wraps around my chest and under my arm and back to my shoulder blades. It's almost numb, but it's not numb. It dull.  It hurts."   PERTINENT HISTORY: 2021 self palpated L breast mass. Diagnostic mammogram and ultrasound revealed 2.7 cm mass at 12 o'clock 6 cm from the nipple. L axillary ultrasound revealed 2 suspicious LN. Both breast mass and LN + for invasive mammary carcinoma grade 3, ER/PR and her  2 negative.R breast non-mass enhancement was negative for Br Ca.Pt underwent neoadjuvant chemotherapy. 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 She underwent bilateral mastectomy with reconstruction in 9/22.Pathology revealed complete pathological response with 2 LN negative and R breast negative for malignancy. Breast implants removed 2/2 staff infection. Anxiety, chemo-induced neuropathy, intermittent L chest wall pain,   IMPAIRMENTS: chronic pain and L chest wall and trunk swelling ( post-mastectomy pain syndrome), extending across L  surgical site, through axilla and across lateral trunk, to L scapula since  bilateral mastectomy and ALND. 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 Impaired body image, disturbed sleep,  OTHER OBSTACLES TO OT LE CARE: limited transportation, limited financial resources ,  limited emotional support from spouse  PAIN:  Are you having pain? Yes NPRS scale: 3/10 Pain location: L and R lateral trunk and chest at mastectomy site, R and L lateral trunk adjacent to axilla PAIN TYPE: chronic Pain description: intermittent , sore, tender, tight numb Aggravating factors: lifting, carrying, pushing, pulling, reaching overhead Relieving factors: unknown   PRECAUTIONS: Other: lymphedema precautions   PATIENT GOALS: relieve post-mastectomy pain and limit LE progression   OBJECTIVE BLE COMPARATIVE LIMB VOLUMETRICS:   09/02/22 INITIAL LUE  LANDMARK LEFT  09/02/22  L MPs 20.5 cm  RUE (excluding hand)  2910.9 ml     Limb Volume differential (LVD)  %  Volume change since initial %  Volume change overall %  (Blank rows = not tested) :    LUE  COMPARATIVE LIMB VOLUMETRICS:   09/02/22 10 th visit  LANDMARK LEFT  10/15/12   L MPs 20.5 cm  RUE (excluding hand)  2910.9 ml     Limb Volume differential (LVD)  %  Volume change since initial LUE volume Increase 8.3 % since initially measured on 09/02/22  Volume change overall %  (Blank rows = not tested) :   LUE COMPARATIVE LIMB VOLUMETRICS:   12/02/22 19 th visit  LANDMARK LUE (19 th visit)  L MPs 20.4 cm  RUE (excluding hand)  3150.6 ml     Limb Volume differential (LVD)    Volume change since 10 th visit INC 0.25%  Volume change since initial INC 8.23%  (Blank rows = not tested) :   PATIENT SURVEYS:    LYMPHEDEMA LIFE IMPACT SCALE: (The extent to which lymphedema-related problems affected your life last week)  Intake score: 20.59%    TODAY'S TREATMENT:  MLD w myofascial release to LUE/LUQ Skin care throughout MLD to assist w skin excursion and hydration   PATIENT EDUCATION: Continued Pt/ CG edu for lymphedema self care home program throughout session. Topics include outcome of comparative limb volumetrics- starting limb volume differentials (LVDs), technology and gradient techniques used for short stretch, multilayer compression wrapping, simple self-MLD, therapeutic lymphatic pumping exercises, skin/nail care, LE precautions,. compression garment recommendations and specifications, wear and care schedule and compression garment donning / doffing w assistive devices. Discussed progress towards all OT goals since commencing CDT. All questions answered to the Pt's satisfaction. Good return.  Person educated: Patient Education method: Handouts, demonstration Education comprehension: verbalized understanding, returned demonstration, and needs further education  HOME EXERCISE PROGRAM: 1.Therapeutic lymphatic pumping therex- 2 sets of 10 reps, hold  5 seconds; elements in order. 2 x daily PRN 2.  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 Belisse compression bra ( 39 a/b) and a JoviPak double mastectomy pad for HOS to reduce fibrosis and facilitate improved lymphatic function to limit progression. 3. Daily skin care to sustain optimal hydration. Wash bites, scratches, blisters, etc with mild soap and water , apply antibacterial first aide cream and a band aide. 4. Simple self-Manual lymphatic drainage (MLD) PRN.  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 Pt continues to tolerate manual therapy and gets an analgesic effect from MLD post Rx, but chest and lateral trunk pain remains intractable. Flexitouch was delivered, but Pt still has not connected with trainer.  Cont at current frequency until  after Flexitouch training is complete and Pt has habituated to device, then decrease to follow-along and PRN  CLINICAL IMPRESSION:  Pt Cont as per POC. .  OBJECTIVE IMPAIRMENTS: decreased knowledge of lymphedema ;  decreased knowledge of use of DME, decreased ROM, increased edema, impaired sensation, and pain with onset in   ACTIVITY LIMITATIONS: carrying, lifting, bed mobility, bathing, dressing, reach over head, and hygiene/grooming  PARTICIPATION LIMITATIONS: occupation  PERSONAL FACTORS:  Hx comorbid anxiety  is also affecting patient's functional outcome.   REHAB POTENTIAL: Good  CLINICAL DECISION MAKING: Stable/uncomplicated   GOALS: Goals reviewed with patient? Yes  LONG TERM GOALS: Target date: 11/22/21   Given this patient's Intake score of 20.59/100% on the Lymphedema Life Impact Scale (LLIS), patient will experience a  reduction of at least 5% in her perceived level of functional impairment resulting from lymphedema to improve functional performance and quality of life (QOL). Baseline: 20.59%  Goal status: 10/07/22 PROGRESSING; 12/08/22 Progressing  2.  Given this patient's Intake score of TBA/100% on the functional outcomes FOTO tool, patient will experience an increase in function of 5 points  to improve basic and instrumental ADLs performance, including lymphedema self-care. Baseline:  TBA Goal status: DEFERRED  3.   Pt will demonstrate understanding of lymphedema precautions and prevention strategies with modified independence using a printed reference to identify at least 5 precautions and discussing how s/he may implement them into daily life to reduce risk of progression and to limit infection risk. Baseline: Max A Goal status:10/07/22 GOAL MET  4.  Pt will be able to don and doff prophylactic compression arm sleeve using assistive devices and extra time (modified independent), and discuss wear  and garment care recommendations  to limit lymphedema by issue date. Baseline: dependent Goal status: 09/15/22 GOAL MET  5.  Pt will demonstrate the ability to perform all lymphedema self-care home program components with printed reference sheets (modified independence) by DC to limit lymphedema progression Baseline: dependent Goal status: 12/09/22 PROGRESSING. Awaiting approval of advanced Flexitouch Plus  6.  Pt will report 0/10 shoulder pain with AROM in all planes at end ranges bilaterally against gravity by DC for optimal functional performance of basic and instrumental ADLs, work and productive activities, leisure pursuits and social participation, Baseline: 2-3/10 Goal status: 12/29/22 ONGOING  PLAN:  PT FREQUENCY: 1-2x/week  PT DURATION: 12 weeks and PRN  PLANNED INTERVENTIONS: Therapeutic exercises, Therapeutic activity, Neuromuscular re-education, Balance training, Gait training, Patient/Family  education, Self Care, and Joint mobilization  PLAN FOR NEXT SESSION:  Cont LUE/LUQ MLD , scar massage and myofascial release. Cont L trunk and chest wall Kinesiotape as tolerated Continue teaching LE self care Progress report  Loel Dubonnet, MS, OTR/L, CLT-LANA 01/19/23 3:33 PM

## 2023-01-21 ENCOUNTER — Encounter: Payer: Commercial Managed Care - HMO | Admitting: Occupational Therapy

## 2023-01-22 ENCOUNTER — Inpatient Hospital Stay: Payer: Commercial Managed Care - HMO

## 2023-01-22 DIAGNOSIS — R7401 Elevation of levels of liver transaminase levels: Secondary | ICD-10-CM

## 2023-01-22 DIAGNOSIS — C50412 Malignant neoplasm of upper-outer quadrant of left female breast: Secondary | ICD-10-CM

## 2023-01-22 DIAGNOSIS — Z95828 Presence of other vascular implants and grafts: Secondary | ICD-10-CM

## 2023-01-22 DIAGNOSIS — Z853 Personal history of malignant neoplasm of breast: Secondary | ICD-10-CM | POA: Diagnosis not present

## 2023-01-22 LAB — CBC WITH DIFFERENTIAL/PLATELET
Abs Immature Granulocytes: 0.05 10*3/uL (ref 0.00–0.07)
Basophils Absolute: 0.1 10*3/uL (ref 0.0–0.1)
Basophils Relative: 1 %
Eosinophils Absolute: 0.2 10*3/uL (ref 0.0–0.5)
Eosinophils Relative: 2 %
HCT: 35.8 % — ABNORMAL LOW (ref 36.0–46.0)
Hemoglobin: 12.4 g/dL (ref 12.0–15.0)
Immature Granulocytes: 1 %
Lymphocytes Relative: 11 %
Lymphs Abs: 1 10*3/uL (ref 0.7–4.0)
MCH: 32.7 pg (ref 26.0–34.0)
MCHC: 34.6 g/dL (ref 30.0–36.0)
MCV: 94.5 fL (ref 80.0–100.0)
Monocytes Absolute: 0.8 10*3/uL (ref 0.1–1.0)
Monocytes Relative: 9 %
Neutro Abs: 6.9 10*3/uL (ref 1.7–7.7)
Neutrophils Relative %: 76 %
Platelets: 196 10*3/uL (ref 150–400)
RBC: 3.79 MIL/uL — ABNORMAL LOW (ref 3.87–5.11)
RDW: 12.5 % (ref 11.5–15.5)
WBC: 9.1 10*3/uL (ref 4.0–10.5)
nRBC: 0 % (ref 0.0–0.2)

## 2023-01-22 LAB — COMPREHENSIVE METABOLIC PANEL
ALT: 28 U/L (ref 0–44)
AST: 30 U/L (ref 15–41)
Albumin: 4.2 g/dL (ref 3.5–5.0)
Alkaline Phosphatase: 79 U/L (ref 38–126)
Anion gap: 8 (ref 5–15)
BUN: 11 mg/dL (ref 6–20)
CO2: 23 mmol/L (ref 22–32)
Calcium: 8.9 mg/dL (ref 8.9–10.3)
Chloride: 106 mmol/L (ref 98–111)
Creatinine, Ser: 0.64 mg/dL (ref 0.44–1.00)
GFR, Estimated: >60 mL/min (ref >60)
Glucose, Bld: 105 mg/dL — ABNORMAL HIGH (ref 70–99)
Potassium: 3.8 mmol/L (ref 3.5–5.1)
Sodium: 137 mmol/L (ref 135–145)
Total Bilirubin: 0.4 mg/dL (ref 0.3–1.2)
Total Protein: 6.7 g/dL (ref 6.5–8.1)

## 2023-01-22 MED ORDER — HEPARIN SOD (PORK) LOCK FLUSH 100 UNIT/ML IV SOLN
500.0000 [IU] | Freq: Once | INTRAVENOUS | Status: AC
  Administered 2023-01-22: 500 [IU] via INTRAVENOUS
  Filled 2023-01-22: qty 5

## 2023-01-22 MED ORDER — SODIUM CHLORIDE 0.9% FLUSH
10.0000 mL | Freq: Once | INTRAVENOUS | Status: AC
  Administered 2023-01-22: 10 mL via INTRAVENOUS
  Filled 2023-01-22: qty 10

## 2023-01-26 ENCOUNTER — Ambulatory Visit: Payer: Commercial Managed Care - HMO | Admitting: Occupational Therapy

## 2023-01-26 DIAGNOSIS — I972 Postmastectomy lymphedema syndrome: Secondary | ICD-10-CM | POA: Diagnosis not present

## 2023-01-26 NOTE — Therapy (Signed)
OUTPATIENT OCCUPATIONAL THERAPY  TREATMENT NOTE  LUE/LUQ POST-MASTECTOMY LYMPHEDEMA  Patient Name: Laniece Hornbaker MRN: [id number] DOB:31-Aug-1991, 32 y.o., female Today's Date: 01/26/2023  END OF SESSION:   OT End of Session - 01/26/23 0808     Visit Number 29    Number of Visits 36    Date for OT Re-Evaluation 02/22/23    OT Start Time 0804    OT Stop Time 0904    OT Time Calculation (min) 60 min    Activity Tolerance Patient tolerated treatment well;No increased pain    Behavior During Therapy WFL for tasks assessed/performed             Past Medical History:  Diagnosis Date   Anxiety    Asthma    Breast cancer 11/2020   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 AXILLARY NODE DISSECTION;  Surgeon: Carolan Shiver, MD;  Location: [facility];  Service: General;  Laterality: Bilateral;  [place], 1.5 hours [place] 2.5 hours   BREAST BIOPSY Left 11/26/2020   vision 12:00 6cmfn [facility]   BREAST BIOPSY Left 11/26/2020   LN bx, hydro marker,  fragments of macrometastatic carcinoma   BREAST RECONSTRUCTION WITH PLACEMENT OF TISSUE EXPANDER AND FLEX HD (ACELLULAR HYDRATED DERMIS) Bilateral 06/25/2021   Procedure: BREAST RECONSTRUCTION WITH PLACEMENT OF TISSUE EXPANDER AND FLEX HD (ACELLULAR HYDRATED DERMIS);  Surgeon: Peggye Form, DO;  Location: [facility];  Service: Plastics;  Laterality: Bilateral;   PORTACATH PLACEMENT Right 12/13/2020   Procedure: INSERTION PORT-A-CATH;  Surgeon: Carolan Shiver, MD;  Location: [facility];  Service: General;  Laterality: Right;   REMOVAL OF BILATERAL TISSUE EXPANDERS WITH  PLACEMENT OF BILATERAL BREAST IMPLANTS Bilateral 09/15/2021   Procedure: REMOVAL OF BILATERAL TISSUE EXPANDERS;  Surgeon: Peggye Form, DO;  [facility];  Service: Plastics;  Laterality: Bilateral;   Patient Active Problem List   Diagnosis Date Noted   Chemotherapy-induced neuropathy 02/06/2022   Right sided weakness 02/06/2022   Transaminitis 11/20/2021   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 10/25/2021   Malignant neoplasm of left breast in female, estrogen receptor positive 06/25/2021   Genetic testing 01/15/2021   Family history of cancer    Invasive carcinoma of breast 12/03/2020   Breast mass, left 07/01/2020   Situational mixed anxiety and depressive disorder 07/01/2020   Anxiety state 08/06/2014   Varicose veins of lower extremity 08/06/2014    PCP: Dorothey Baseman, MD   REFERRING PROVIDER: Creig Hines, MD  REFERRING DIAG: 197.2  THERAPY DIAG: Post-mastectomy lymphedema syndrome  ONSET DATE: 9/22  SUBJECTIVE  SUBJECTIVE STATEMENT: Zykeriah Mathia presents to OT to address LUE/LUQ post-mastectomy lymphedema and post-mastectomy pain syndrome of LUQ. Pt reported pain at surgical sites and lateral trunk are unchanged since last visit (3/10) "It's where the pockets of swelling are. It feels like fullness. It wraps around my chest and under my arm and back to my shoulder blades. It's almost numb, but it's not numb. It dull.  It hurts." Pt reports she has a virtual training scheduled with the tactile trainer this week to set up her Flexitouch device.  PERTINENT HISTORY: 2021 self palpated L breast mass. Diagnostic mammogram and ultrasound revealed 2.7 cm mass at 12 o'clock 6 cm from the nipple. L axillary ultrasound revealed 2 suspicious LN. Both breast mass and  LN + for invasive mammary carcinoma grade 3, ER/PR and her 2 negative.R breast non-mass enhancement was negative for Br Ca.Pt underwent neoadjuvant chemotherapy. 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 She underwent bilateral mastectomy with reconstruction in 9/22.Pathology revealed complete pathological response with 2 LN negative and R breast negative for malignancy. Breast implants removed 2/2 staff infection. Anxiety, chemo-induced neuropathy, intermittent L chest wall pain,   IMPAIRMENTS: chronic pain and L chest wall and trunk swelling ( post-mastectomy pain syndrome), extending across L  surgical site, through axilla and across lateral trunk, to L scapula since  bilateral mastectomy and ALND. 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 Impaired body image, disturbed sleep,  OTHER OBSTACLES TO OT LE CARE: limited transportation, limited financial resources ,  limited emotional support from spouse  PAIN:  Are you having pain? Yes NPRS scale: 3/10 Pain location: L and R lateral trunk and chest at mastectomy site, R and L lateral trunk adjacent to axilla PAIN TYPE: chronic Pain description: intermittent , sore, tender, tight numb Aggravating factors: lifting, carrying, pushing, pulling, reaching overhead Relieving factors: unknown   PRECAUTIONS: Other: lymphedema precautions   PATIENT GOALS: relieve post-mastectomy pain and limit LE progression   OBJECTIVE BLE COMPARATIVE LIMB VOLUMETRICS:   09/02/22 INITIAL LUE  LANDMARK LEFT  09/02/22  L MPs 20.5 cm  RUE (excluding hand)  2910.9 ml     Limb Volume differential (LVD)  %  Volume change since initial %  Volume change  overall %  (Blank rows = not tested) :    LUE COMPARATIVE LIMB VOLUMETRICS:   09/02/22 10 th visit  LANDMARK LEFT  10/15/12   L MPs 20.5 cm  RUE (excluding hand)  2910.9 ml     Limb Volume differential (LVD)  %  Volume change since initial LUE volume Increase 8.3 % since initially measured on 09/02/22  Volume change overall %  (Blank rows = not tested) :   LUE COMPARATIVE LIMB VOLUMETRICS:   12/02/22 19 th visit  LANDMARK LUE (19 th visit)  L MPs 20.4 cm  RUE (excluding hand)  3150.6 ml     Limb Volume differential (LVD)    Volume change since 10 th visit INC 0.25%  Volume change since initial INC 8.23%  (Blank rows = not tested) :   PATIENT SURVEYS:    LYMPHEDEMA LIFE IMPACT SCALE: (The extent to which lymphedema-related problems affected your life last week)  Intake score: 20.59%    TODAY'S  TREATMENT:                                                                                                                                        MLD  to LUE/LUQ w emphasis on LUE Skin care throughout MLD to assist w skin excursion and hydration There ex:  NuStep Recumbent bike to build endurance 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 PATIENT EDUCATION: Continued Pt/ CG edu for lymphedema self care home program throughout session. Topics include outcome of comparative limb volumetrics- starting limb volume differentials (LVDs), technology and gradient techniques used for short stretch, multilayer compression wrapping, simple self-MLD, therapeutic lymphatic pumping exercises, skin/nail care, LE precautions,. compression garment recommendations and specifications, wear and care schedule and compression garment donning / doffing w assistive devices. Discussed progress towards all OT goals since commencing CDT. All questions answered to the Pt's satisfaction. Good return.  Person educated:  Patient Education method: Handouts, demonstration Education comprehension: verbalized understanding, returned demonstration, and needs further education  HOME EXERCISE PROGRAM: 1.Therapeutic lymphatic pumping therex- 2 sets of 10 reps, hold 5 seconds; elements in order. 2 x daily PRN 2. Resistant bands (Red and Green)  - for upper and lower extremity strengthening. 3.  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 Belisse compression bra ( 39 a/b) and a JoviPak double mastectomy pad for HOS to reduce fibrosis and facilitate improved lymphatic function to limit progression. 4. Daily skin care to sustain optimal hydration. Wash bites, scratches, blisters, etc with mild soap and water , apply antibacterial first aide cream and a band aide. 5. Simple self-Manual lymphatic drainage (MLD) PRN.  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 Pt able to complete 1 mile on the NuStep this morning at a slow pace and mild resistance without a break ( 8 minutes). After skilled teaching Pt demonstrates mastery of there ex program using red resistance band for upper and lower extremity strengthening. Also issued a green band to enable Pt to advance her program later on.  Emphasis of MLD on LUE today utilizing contralateral AAA and ipsilateral AI pathways. Pt  tolerated manual therapy without pain. Reduce Rx frequency once Flexitouch training is complete. Progress report next visit. Cont as per POC. .  OBJECTIVE IMPAIRMENTS: decreased knowledge of lymphedema ;  decreased knowledge of use of DME, decreased ROM, increased edema, impaired sensation, and pain with  onset in   ACTIVITY LIMITATIONS: carrying, lifting, bed mobility, bathing, dressing, reach over head, and hygiene/grooming  PARTICIPATION  LIMITATIONS: occupation  PERSONAL FACTORS:  Hx comorbid anxiety  is also affecting patient's functional outcome.   REHAB POTENTIAL: Good  CLINICAL DECISION MAKING: Stable/uncomplicated   GOALS: Goals reviewed with patient? Yes  LONG TERM GOALS: Target date: 11/22/21   Given this patient's Intake score of 20.59/100% on the Lymphedema Life Impact Scale (LLIS), patient will experience a reduction of at least 5% in her perceived level of functional impairment resulting from lymphedema to improve functional performance and quality of life (QOL). Baseline: 20.59%  Goal status: 10/07/22 PROGRESSING; 12/08/22 Progressing  2.  Given this patient's Intake score of TBA/100% on the functional outcomes FOTO tool, patient will experience an increase in function of 5 points  to improve basic and instrumental ADLs performance, including lymphedema self-care. Baseline:  TBA Goal status: DEFERRED  3.   Pt will demonstrate understanding of lymphedema precautions and prevention strategies with modified independence using a printed reference to identify at least 5 precautions and discussing how s/he may implement them into daily life to reduce risk of progression and to limit infection risk. Baseline: Max A Goal status:10/07/22 GOAL MET  4.  Pt will be able to don and doff prophylactic compression arm sleeve using assistive devices and extra time (modified independent), and discuss wear and garment care recommendations  to limit lymphedema by issue date. Baseline: dependent Goal status: 09/15/22 GOAL MET  5.  Pt will demonstrate the ability to perform all lymphedema self-care home program components with printed reference sheets (modified independence) by DC to limit lymphedema progression Baseline: dependent Goal status: 12/09/22 PROGRESSING. Awaiting approval of advanced  Flexitouch Plus  6.  Pt will report 0/10 shoulder pain with AROM in all planes at end ranges bilaterally against gravity by DC for optimal functional performance of basic and instrumental ADLs, work and productive activities, leisure pursuits and social participation, Baseline: 2-3/10 Goal status: 12/29/22 ONGOING  PLAN:  PT FREQUENCY: 1-2x/week  PT DURATION: 12 weeks and PRN  PLANNED INTERVENTIONS: Therapeutic exercises, Therapeutic activity, Neuromuscular re-education, Balance training, Gait training, Patient/Family education, Self Care, and Joint mobilization  PLAN FOR NEXT SESSION:  Cont LUE/LUQ MLD , scar massage and myofascial release. Cont L trunk and chest wall Kinesiotape as tolerated Continue teaching LE self care Progress report  Loel Dubonnet, MS, OTR/L, CLT-LANA 01/26/23 9:11 AM

## 2023-01-28 ENCOUNTER — Encounter: Payer: Commercial Managed Care - HMO | Admitting: Occupational Therapy

## 2023-02-02 ENCOUNTER — Ambulatory Visit: Payer: Commercial Managed Care - HMO | Admitting: Occupational Therapy

## 2023-02-04 ENCOUNTER — Encounter: Payer: Commercial Managed Care - HMO | Admitting: Occupational Therapy

## 2023-02-05 ENCOUNTER — Inpatient Hospital Stay: Payer: Commercial Managed Care - HMO

## 2023-02-05 ENCOUNTER — Inpatient Hospital Stay: Payer: Commercial Managed Care - HMO | Attending: Oncology

## 2023-02-05 DIAGNOSIS — G620 Drug-induced polyneuropathy: Secondary | ICD-10-CM | POA: Diagnosis not present

## 2023-02-05 DIAGNOSIS — Z95828 Presence of other vascular implants and grafts: Secondary | ICD-10-CM

## 2023-02-05 DIAGNOSIS — Z8049 Family history of malignant neoplasm of other genital organs: Secondary | ICD-10-CM | POA: Diagnosis not present

## 2023-02-05 DIAGNOSIS — T451X5A Adverse effect of antineoplastic and immunosuppressive drugs, initial encounter: Secondary | ICD-10-CM | POA: Diagnosis not present

## 2023-02-05 DIAGNOSIS — Z171 Estrogen receptor negative status [ER-]: Secondary | ICD-10-CM | POA: Diagnosis not present

## 2023-02-05 DIAGNOSIS — Z79899 Other long term (current) drug therapy: Secondary | ICD-10-CM | POA: Diagnosis not present

## 2023-02-05 DIAGNOSIS — F419 Anxiety disorder, unspecified: Secondary | ICD-10-CM | POA: Insufficient documentation

## 2023-02-05 DIAGNOSIS — R7401 Elevation of levels of liver transaminase levels: Secondary | ICD-10-CM

## 2023-02-05 DIAGNOSIS — C50412 Malignant neoplasm of upper-outer quadrant of left female breast: Secondary | ICD-10-CM | POA: Insufficient documentation

## 2023-02-05 DIAGNOSIS — F32A Depression, unspecified: Secondary | ICD-10-CM | POA: Insufficient documentation

## 2023-02-05 DIAGNOSIS — K754 Autoimmune hepatitis: Secondary | ICD-10-CM | POA: Diagnosis not present

## 2023-02-05 LAB — CBC WITH DIFFERENTIAL/PLATELET
Abs Immature Granulocytes: 0.04 10*3/uL (ref 0.00–0.07)
Basophils Absolute: 0 10*3/uL (ref 0.0–0.1)
Basophils Relative: 1 %
Eosinophils Absolute: 0.2 10*3/uL (ref 0.0–0.5)
Eosinophils Relative: 2 %
HCT: 37.6 % (ref 36.0–46.0)
Hemoglobin: 12.8 g/dL (ref 12.0–15.0)
Immature Granulocytes: 1 %
Lymphocytes Relative: 16 %
Lymphs Abs: 1.4 10*3/uL (ref 0.7–4.0)
MCH: 32 pg (ref 26.0–34.0)
MCHC: 34 g/dL (ref 30.0–36.0)
MCV: 94 fL (ref 80.0–100.0)
Monocytes Absolute: 0.7 10*3/uL (ref 0.1–1.0)
Monocytes Relative: 8 %
Neutro Abs: 6.4 10*3/uL (ref 1.7–7.7)
Neutrophils Relative %: 72 %
Platelets: 199 10*3/uL (ref 150–400)
RBC: 4 MIL/uL (ref 3.87–5.11)
RDW: 12.4 % (ref 11.5–15.5)
WBC: 8.8 10*3/uL (ref 4.0–10.5)
nRBC: 0 % (ref 0.0–0.2)

## 2023-02-05 LAB — COMPREHENSIVE METABOLIC PANEL
ALT: 69 U/L — ABNORMAL HIGH (ref 0–44)
AST: 56 U/L — ABNORMAL HIGH (ref 15–41)
Albumin: 4.4 g/dL (ref 3.5–5.0)
Alkaline Phosphatase: 91 U/L (ref 38–126)
Anion gap: 8 (ref 5–15)
BUN: 12 mg/dL (ref 6–20)
CO2: 25 mmol/L (ref 22–32)
Calcium: 8.8 mg/dL — ABNORMAL LOW (ref 8.9–10.3)
Chloride: 102 mmol/L (ref 98–111)
Creatinine, Ser: 0.92 mg/dL (ref 0.44–1.00)
GFR, Estimated: >60 mL/min (ref >60)
Glucose, Bld: 92 mg/dL (ref 70–99)
Potassium: 4 mmol/L (ref 3.5–5.1)
Sodium: 135 mmol/L (ref 135–145)
Total Bilirubin: 0.6 mg/dL (ref 0.3–1.2)
Total Protein: 7.3 g/dL (ref 6.5–8.1)

## 2023-02-05 MED ORDER — HEPARIN SOD (PORK) LOCK FLUSH 100 UNIT/ML IV SOLN
500.0000 [IU] | Freq: Once | INTRAVENOUS | Status: AC
  Administered 2023-02-05: 500 [IU] via INTRAVENOUS
  Filled 2023-02-05: qty 5

## 2023-02-05 MED ORDER — SODIUM CHLORIDE 0.9% FLUSH
10.0000 mL | Freq: Once | INTRAVENOUS | Status: AC
  Administered 2023-02-05: 10 mL via INTRAVENOUS
  Filled 2023-02-05: qty 10

## 2023-02-10 ENCOUNTER — Encounter: Payer: Self-pay | Admitting: Occupational Therapy

## 2023-02-10 ENCOUNTER — Ambulatory Visit: Payer: Commercial Managed Care - HMO | Attending: Oncology | Admitting: Occupational Therapy

## 2023-02-10 DIAGNOSIS — I972 Postmastectomy lymphedema syndrome: Secondary | ICD-10-CM | POA: Diagnosis present

## 2023-02-10 NOTE — Therapy (Signed)
OUTPATIENT OCCUPATIONAL THERAPY TREATMENT NOTE and PROGRESS REPORT  LUE/LUQ POST-MASTECTOMY LYMPHEDEMA  Patient Name: Vickie Mathis MRN: [id number] DOB:[birth year], 32 y.o., female Today's Date: 02/10/2023  END OF SESSION:   OT End of Session - 02/10/23 0804     Visit Number 30    Number of Visits 36    Date for OT Re-Evaluation 02/22/23    OT Start Time 0804    OT Stop Time 0904    OT Time Calculation (min) 60 min    Activity Tolerance Patient tolerated treatment well;No increased pain    Behavior During Therapy WFL for tasks assessed/performed             Past Medical History:  Diagnosis Date   Anxiety    Asthma    Breast cancer (HCC) 11/2020   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 AXILLARY NODE DISSECTION;  Surgeon: Carolan Shiver, MD;  Location: [facility];  Service: General;  Laterality: Bilateral;  [place], 1.5 hours Cintron-Diaz 2.5 hours   BREAST BIOPSY Left 11/26/2020   vision 12:00 6cmfn [facility]   BREAST BIOPSY Left 11/26/2020   LN bx, hydro marker,  fragments of macrometastatic carcinoma   BREAST RECONSTRUCTION WITH PLACEMENT OF TISSUE EXPANDER AND FLEX HD (ACELLULAR HYDRATED DERMIS) Bilateral 06/25/2021   Procedure: BREAST RECONSTRUCTION WITH PLACEMENT OF TISSUE EXPANDER AND FLEX HD (ACELLULAR HYDRATED DERMIS);  Surgeon: Peggye Form, DO;  Location: [facility];  Service: Plastics;  Laterality: Bilateral;   PORTACATH PLACEMENT Right 12/13/2020   Procedure: INSERTION PORT-A-CATH;  Surgeon: Carolan Shiver, MD;  Location: [facility];  Service: General;  Laterality: Right;   REMOVAL OF  BILATERAL TISSUE EXPANDERS WITH PLACEMENT OF BILATERAL BREAST IMPLANTS Bilateral 09/15/2021   Procedure: REMOVAL OF BILATERAL TISSUE EXPANDERS;  Surgeon: Peggye Form, DO;  Location: [facility];  Service: Plastics;  Laterality: Bilateral;   Patient Active Problem List   Diagnosis Date Noted   Chemotherapy-induced neuropathy (HCC) 02/06/2022   Right sided weakness 02/06/2022   Transaminitis 11/20/2021   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 10/25/2021   Malignant neoplasm of left breast in female, estrogen receptor positive (HCC) 06/25/2021   Genetic testing 01/15/2021   Family history of cancer    Invasive carcinoma of breast (HCC) 12/03/2020   Breast mass, left 07/01/2020   Situational mixed anxiety and depressive disorder 07/01/2020   Anxiety state 08/06/2014   Varicose veins of lower extremity 08/06/2014    PCP: Dorothey Baseman, MD   REFERRING PROVIDER: Creig Hines, MD  REFERRING DIAG: 197.2  THERAPY DIAG: Post-mastectomy lymphedema syndrome  ONSET DATE: 9/22  SUBJECTIVE  SUBJECTIVE STATEMENT: Vickie Mathis presents to OT to address LUE/LUQ post-mastectomy lymphedema and post-mastectomy pain syndrome of LUQ. Pt rates discomfort, tightness, itching in LUQ at 3/10. "It's pretty consistent. I feel like it's gotten better through our sessions."  PERTINENT HISTORY: 2021 self palpated L breast mass. Diagnostic mammogram and ultrasound revealed 2.7 cm mass at 12 o'clock 6 cm from the nipple. L axillary ultrasound revealed 2 suspicious LN. Both breast mass and LN + for invasive mammary carcinoma grade 3, ER/PR and her 2 negative.R breast non-mass enhancement was negative for Br Ca.Pt underwent neoadjuvant chemotherapy. 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 She underwent bilateral mastectomy with reconstruction in 9/22.Pathology revealed complete pathological response with 2 LN negative and R breast negative for malignancy. Breast implants removed 2/2 staff infection. Anxiety, chemo-induced neuropathy, intermittent L chest wall pain,   IMPAIRMENTS: chronic pain and L chest wall and trunk swelling ( post-mastectomy pain syndrome), extending across L  surgical site, through axilla and across lateral trunk, to L scapula since  bilateral mastectomy and ALND. 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 Impaired body image, disturbed sleep,  OTHER OBSTACLES TO OT LE CARE: limited transportation, limited financial resources ,  limited emotional support from spouse  PAIN:  Are you having pain? Yes NPRS scale: 3/10 Pain location: L and R lateral trunk and chest at mastectomy site, R and L lateral trunk adjacent to axilla PAIN TYPE: chronic Pain description: intermittent , sore, tender, tight numb Aggravating factors: lifting, carrying, pushing, pulling, reaching overhead Relieving factors: unknown   PRECAUTIONS: Other: lymphedema precautions   PATIENT GOALS: relieve post-mastectomy pain and limit LE progression   OBJECTIVE BLE COMPARATIVE LIMB VOLUMETRICS:   09/02/22 INITIAL LUE  LANDMARK LEFT  09/02/22  L MPs 20.5 cm  RUE (excluding hand)  2910.9 ml     Limb Volume differential (LVD)  %  Volume change since initial %  Volume change overall %  (Blank rows = not tested) :    LUE COMPARATIVE LIMB VOLUMETRICS:   09/02/22 10 th visit  LANDMARK LEFT  10/15/12   L MPs 20.5 cm  RUE (excluding hand)  2910.9 ml     Limb Volume differential (LVD)   %  Volume change since initial LUE volume Increase 8.3 % since initially measured on 09/02/22  Volume change overall %  (Blank rows = not tested) :   LUE COMPARATIVE LIMB VOLUMETRICS:   12/02/22 19 th visit  LANDMARK LUE (19 th visit)  L MPs 20.4 cm  RUE (excluding hand)  3150.6 ml     Limb Volume differential (LVD)    Volume change since 10 th visit INC 0.25%  Volume change since initial INC 8.23%  (Blank rows = not tested) :   BLE COMPARATIVE LIMB VOLUMETRICS:   02/10/23 FINAL    LEFT Rx limb LANDMARK LEFT  02/10/23  L MPs 20.5 cm  L UE (excluding hand)  3084.0  ml     Limb Volume differential (LVD)  0.4%, L>R (WNL)  Volume change since last  12/02/22 DEC 2.1%  Volume change overall INC 5.9 %  (Blank rows = not tested) :     RIGHT (dominant) LANDMARK RIGHT 02/10/23  R MPs 20.5  cm  RUE (excluding hand)  3096.3 ml     Limb Volume differential (LVD)  %  Volume change since initial %  Volume change overall %  (Blank rows = not tested) :    PATIENT SURVEYS:    LYMPHEDEMA LIFE IMPACT SCALE: 08/24/22 INTAKE 20.59%           02/10/23    OUT TAKE 13.24%; reduction of 7.35%      (The extent to which lymphedema-related problems affected your life last week)  Intake score: 20.59%    TODAY'S TREATMENT:                                                                                                                                        MLD  to LUE/LUQ w emphasis on LUE Skin care throughout MLD to assist w skin excursion and hydration BUE comparative limb volumetrics Goal assessment and progress review PATIENT EDUCATION: Continued Pt/ CG edu for lymphedema self care home program throughout session. Topics include outcome of comparative limb volumetrics- starting limb volume differentials (LVDs), technology and gradient techniques used for short stretch, multilayer compression wrapping, simple self-MLD, therapeutic lymphatic pumping exercises, skin/nail care, LE precautions,.  compression garment recommendations and specifications, wear and care schedule and compression garment donning / doffing w assistive devices. Discussed progress towards all OT goals since commencing CDT. All questions answered to the Pt's satisfaction. Good return.  Person educated: Patient Education method: Handouts, demonstration Education comprehension: verbalized understanding, returned demonstration, and needs further education  HOME EXERCISE PROGRAM: 1.Therapeutic lymphatic pumping therex- 2 sets of 10 reps, hold 5 seconds; elements in order. 2 x daily PRN 2. Resistant bands (Red and Green)  - for upper and lower extremity strengthening. 3.  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 Belisse compression bra ( 39 a/b) and a JoviPak double mastectomy pad for HOS to reduce fibrosis and facilitate improved lymphatic function to limit progression. 4. Daily skin care to sustain optimal hydration. Wash bites, scratches, blisters, etc with mild soap and water , apply antibacterial first aide cream and a band aide. 5. Simple self-Manual lymphatic drainage (MLD) PRN.  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 Pt has met all OT goals for lymphedema care and is compliant with self are home program. She was fitted with an appropriate prophylactic compression arm sleeve on the L and wear it as directed for demanding activities. She has returned to work part time . BUE comparative limb volumetrics reveal limb volume differential is WNL at 0.4%, L (Rx) > R (dominant). Limb volumes have actually  increased as Pt builds muscle mass through exercise and  occupational demands. Pt has obtained a Flexitouch advanced sequential pneumatic compression device to assist with lymphatic decongestion and pain relief over time at home. This device also has a chest wall and lateral trunk component and follows lymphatic anatomy to move fluid to contralateral axilla and towards ipsilateral inguinal anastomosis.Pt agrees with plan to reduce OT frequency to follow along status next visit.   MLD on LUE today utilizing contralateral AAA and ipsilateral AI pathways. Pt  tolerated manual therapy without pain. Reduce Rx frequency once Flexitouch training is complete. Progress report next visit. Cont as per POC. .  OBJECTIVE IMPAIRMENTS: decreased knowledge of lymphedema ;  decreased knowledge of use of DME, decreased ROM, increased edema, impaired sensation, and pain with onset in   ACTIVITY LIMITATIONS: carrying, lifting, bed mobility, bathing, dressing, reach over head, and hygiene/grooming  PARTICIPATION LIMITATIONS: occupation  PERSONAL FACTORS:  Hx comorbid anxiety  is also affecting patient's functional outcome.   REHAB POTENTIAL: Good  CLINICAL DECISION MAKING: Stable/uncomplicated   GOALS: Goals reviewed with patient? Yes  LONG TERM GOALS: Target date: 11/22/21   Given this patient's Intake score of 20.59/100% on the Lymphedema Life Impact Scale (LLIS), patient will experience a reduction of at least 5% in her perceived level of functional impairment resulting from lymphedema to improve functional performance and quality of life (QOL). Baseline: 20.59%  Goal status: 10/07/22 PROGRESSING; 12/08/22 Progressing; 02/10/23 GOAL MET w 7.35% reduction  2.  Given this patient's Intake score of TBA/100% on the functional outcomes FOTO tool, patient will experience an increase in function of 5 points  to improve basic and instrumental ADLs performance, including lymphedema self-care. Baseline:  TBA Goal status: DEFERRED  3.   Pt will demonstrate understanding of  lymphedema precautions and prevention strategies with modified independence using a printed reference to identify at least 5 precautions and discussing how s/he may implement them into daily life to reduce risk of progression and to limit infection risk. Baseline: Max A Goal status:10/07/22 GOAL MET  4.  Pt will be able to don and doff prophylactic compression arm sleeve using assistive devices and extra time (modified independent), and discuss wear and garment care recommendations  to limit lymphedema by issue date. Baseline: dependent Goal status: 09/15/22 GOAL MET  5.  Pt will demonstrate the ability to perform all lymphedema self-care home program components with printed reference sheets (modified independence) by DC to limit lymphedema progression Baseline: dependent Goal status: 12/09/22 PROGRESSING. Awaiting approval of advanced Flexitouch Plus  02/10/23 GOAL MET  6.  Pt will report 0/10 shoulder pain with AROM in all planes at end ranges bilaterally against gravity by DC for optimal functional performance of basic and instrumental ADLs, work and productive activities, leisure pursuits and social participation, Baseline: 2-3/10 Goal status: 12/29/22 ONGOING 02/10/23 GOAL MET  PLAN:  PT FREQUENCY: 1-2x/week  PT DURATION: 12 weeks and PRN  PLANNED INTERVENTIONS: Therapeutic exercises, Therapeutic activity, Neuromuscular re-education, Balance training, Gait training, Patient/Family education, Self Care, and Joint mobilization  PLAN FOR NEXT SESSION:  Review LE precautions and self-care home program MLD as established.  Decrease OT frequency to f/u and PRN  Loel Dubonnet, MS, OTR/L, CLT-LANA 02/10/23 10:59 AM

## 2023-02-17 ENCOUNTER — Ambulatory Visit: Payer: Commercial Managed Care - HMO | Admitting: Occupational Therapy

## 2023-02-17 ENCOUNTER — Encounter: Payer: Self-pay | Admitting: Occupational Therapy

## 2023-02-17 DIAGNOSIS — I972 Postmastectomy lymphedema syndrome: Secondary | ICD-10-CM

## 2023-02-17 NOTE — Therapy (Signed)
OUTPATIENT OCCUPATIONAL THERAPY TREATMENT NOTE and PROGRESS REPORT  LUE/LUQ POST-MASTECTOMY LYMPHEDEMA  Patient Name: Vickie Mathis MRN: [id number] DOB:[birth year], 32 y.o., female Today's Date: 02/17/2023  END OF SESSION:   OT End of Session - 02/17/23 0812     Visit Number 31    Number of Visits 36    Date for OT Re-Evaluation 02/22/23    OT Start Time 0808    OT Stop Time 0900    OT Time Calculation (min) 52 min    Activity Tolerance Patient tolerated treatment well;No increased pain    Behavior During Therapy WFL for tasks assessed/performed             Past Medical History:  Diagnosis Date   Anxiety    Asthma    Breast cancer (HCC) 11/2020   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 AXILLARY NODE DISSECTION;  Surgeon: Carolan Shiver, MD;  Location: [facility];  Service: General;  Laterality: Bilateral;  [place], 1.5 hours Cintron-Diaz 2.5 hours   BREAST BIOPSY Left 11/26/2020   vision 12:00 6cmfn [facility]   BREAST BIOPSY Left 11/26/2020   LN bx, hydro marker,  fragments of macrometastatic carcinoma   BREAST RECONSTRUCTION WITH PLACEMENT OF TISSUE EXPANDER AND FLEX HD (ACELLULAR HYDRATED DERMIS) Bilateral 06/25/2021   Procedure: BREAST RECONSTRUCTION WITH PLACEMENT OF TISSUE EXPANDER AND FLEX HD (ACELLULAR HYDRATED DERMIS);  Surgeon: Peggye Form, DO;  Location: [facility];  Service: Plastics;  Laterality: Bilateral;   PORTACATH PLACEMENT Right 12/13/2020   Procedure: INSERTION PORT-A-CATH;  Surgeon: Carolan Shiver, MD;  Location: [facility];  Service: General;  Laterality: Right;   REMOVAL OF  BILATERAL TISSUE EXPANDERS WITH PLACEMENT OF BILATERAL BREAST IMPLANTS Bilateral 09/15/2021   Procedure: REMOVAL OF BILATERAL TISSUE EXPANDERS;  Surgeon: Peggye Form, DO;  Location: [facility];  Service: Plastics;  Laterality: Bilateral;   Patient Active Problem List   Diagnosis Date Noted   Chemotherapy-induced neuropathy (HCC) 02/06/2022   Right sided weakness 02/06/2022   Transaminitis 11/20/2021   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 10/25/2021   Malignant neoplasm of left breast in female, estrogen receptor positive (HCC) 06/25/2021   Genetic testing 01/15/2021   Family history of cancer    Invasive carcinoma of breast (HCC) 12/03/2020   Breast mass, left 07/01/2020   Situational mixed anxiety and depressive disorder 07/01/2020   Anxiety state 08/06/2014   Varicose veins of lower extremity 08/06/2014    PCP: Dorothey Baseman, MD   REFERRING PROVIDER: Creig Hines, MD  REFERRING DIAG: 197.2  THERAPY DIAG: Post-mastectomy lymphedema syndrome  ONSET DATE: 9/22  SUBJECTIVE  SUBJECTIVE STATEMENT: Vickie Mathis presents to OT to address LUE/LUQ post-mastectomy lymphedema and post-mastectomy pain syndrome of LUQ. Pt rates discomfort, tightness, itching in LUQ , including shoulder and chest wall, at 3/10. "It's pretty consistent. I feel like it's gotten better through our sessions."  PERTINENT HISTORY: 2021 self palpated L breast mass. Diagnostic mammogram and ultrasound revealed 2.7 cm mass at 12 o'clock 6 cm from the nipple. L axillary ultrasound revealed 2 suspicious LN. Both breast mass and LN + for invasive mammary carcinoma grade 3, ER/PR and her 2 negative.R breast non-mass enhancement was negative for Br Ca.Pt underwent neoadjuvant chemotherapy. 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 She underwent bilateral mastectomy with reconstruction in 9/22.Pathology revealed complete pathological response with 2 LN negative and R breast negative for malignancy. Breast implants removed 2/2 staff infection. Anxiety, chemo-induced neuropathy, intermittent L chest wall pain,   IMPAIRMENTS: chronic pain and L chest wall and trunk swelling ( post-mastectomy pain syndrome), extending across L  surgical site, through axilla and across lateral trunk, to L scapula since  bilateral mastectomy and ALND. 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 Impaired body image, disturbed sleep,  OTHER OBSTACLES TO OT LE CARE: limited transportation, limited financial resources ,  limited emotional support from spouse  PAIN:  Are you having pain? Yes NPRS scale: 3/10 Pain location: L and R lateral trunk and chest at mastectomy site, R and L lateral trunk adjacent to axilla PAIN TYPE: chronic Pain description: intermittent , sore, tender, tight numb Aggravating factors: lifting, carrying, pushing, pulling, reaching overhead Relieving factors: unknown   PRECAUTIONS: Other: lymphedema precautions   PATIENT GOALS: relieve post-mastectomy pain and limit LE progression   OBJECTIVE BLE COMPARATIVE LIMB VOLUMETRICS:   09/02/22 INITIAL LUE  LANDMARK LEFT  09/02/22  L MPs 20.5 cm  RUE (excluding hand)  2910.9 ml     Limb Volume differential (LVD)  %  Volume change since initial %  Volume change overall %  (Blank rows = not tested) :    LUE COMPARATIVE LIMB VOLUMETRICS:   09/02/22 10 th visit  LANDMARK LEFT  10/15/12   L MPs 20.5 cm  RUE (excluding hand)   2910.9 ml     Limb Volume differential (LVD)  %  Volume change since initial LUE volume Increase 8.3 % since initially measured on 09/02/22  Volume change overall %  (Blank rows = not tested) :   LUE COMPARATIVE LIMB VOLUMETRICS:   12/02/22 19 th visit  LANDMARK LUE (19 th visit)  L MPs 20.4 cm  RUE (excluding hand)  3150.6 ml     Limb Volume differential (LVD)    Volume change since 10 th visit INC 0.25%  Volume change since initial INC 8.23%  (Blank rows = not tested) :   BLE COMPARATIVE LIMB VOLUMETRICS:   02/10/23 FINAL    LEFT Rx limb LANDMARK LEFT  02/10/23  L MPs 20.5 cm  L UE (excluding hand)  3084.0  ml     Limb Volume differential (LVD)  0.4%, L>R (WNL)  Volume change since last  12/02/22 DEC 2.1%  Volume change overall INC 5.9 %  (Blank rows = not tested) :     RIGHT (dominant) LANDMARK  RIGHT 02/10/23  R MPs 20.5 cm  RUE (excluding hand)  3096.3 ml     Limb Volume differential (LVD)  %  Volume change since initial %  Volume change overall %  (Blank rows = not tested) :    PATIENT SURVEYS:    LYMPHEDEMA LIFE IMPACT SCALE: 08/24/22 INTAKE 20.59%           02/17/23    OUT TAKE 1.47% results in reduction of 19.12 %     (The extent to which lymphedema-related problems affected your life last week)  Intake score: 20.59%    TODAY'S TREATMENT:                                                                                                                                        MLD  to LUE/LUQ w emphasis on LUE Skin care throughout MLD to assist w skin excursion and hydration. Silicon scar gel added to L mastectomy scar Reviewed LE self care home program: MLD, compression garment at work and when flying, cont strengthening for return to full time job, and cont skin care as instructed.  PATIENT EDUCATION: Continued Pt/ CG edu for lymphedema self care home program throughout session. Topics include outcome of comparative limb volumetrics- starting limb volume differentials  (LVDs), technology and gradient techniques used for short stretch, multilayer compression wrapping, simple self-MLD, therapeutic lymphatic pumping exercises, skin/nail care, LE precautions,. compression garment recommendations and specifications, wear and care schedule and compression garment donning / doffing w assistive devices. Discussed progress towards all OT goals since commencing CDT. All questions answered to the Pt's satisfaction. Good return.  Person educated: Patient Education method: Handouts, demonstration Education comprehension: verbalized understanding, returned demonstration, and needs further education  HOME EXERCISE PROGRAM: 1.Therapeutic lymphatic pumping therex- 2 sets of 10 reps, hold 5 seconds; elements in order. 2 x daily PRN 2. Resistant bands (Red and Green)  - for upper and lower extremity strengthening. 3.  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 Belisse compression bra ( 39 a/b) and a JoviPak double mastectomy pad for HOS to reduce fibrosis and facilitate improved lymphatic function to limit progression. 4. Daily skin care to sustain optimal hydration. Wash bites, scratches, blisters, etc with mild soap and water , apply antibacterial first aide cream and a band aide. 5. Simple self-Manual lymphatic drainage (MLD) PRN.  ASSESSMENT:   CLINICAL IMPRESSION:   Emphasis of today's last scheduled OT visit for LE care focused on Pt education throughout session while providing MLD to LUE/LUQ. Pt is able to preform simple self MLD and has a individualized handout for reference. Final score on the LLIS  equals 1. 47%, which indicated an overall reduction in the extent to which LE related problems impact daily life of 19.12%.   Joretha has  obtained and uses as directed an advanced Flexitouch pneumatic compression device to assist with ongoing LE self  management at home over time. Pt has a prophylactic compression arm sleeve and understands lymphedema precautions for garment wear and care recommendations. Provided silicone scar gel sheet  for L mastectomy scar to assist with scar hydration and improved excursion while desensitizing.  Pt continues to build upper and lower body strength using exercise to meet her goal of returning to work at [employer] Today she tolerated manual therapy without increased pain and agrees to return for OT follow along in 3 months to check limb volumetrics and assess  garment.   OBJECTIVE IMPAIRMENTS: decreased knowledge of lymphedema ;  decreased knowledge of use of DME, decreased ROM, increased edema, impaired sensation, and pain with onset in   ACTIVITY LIMITATIONS: carrying, lifting, bed mobility, bathing, dressing, reach over head, and hygiene/grooming  PARTICIPATION LIMITATIONS: occupation  PERSONAL FACTORS:  Hx comorbid anxiety  is also affecting patient's functional outcome.   REHAB POTENTIAL: Good  CLINICAL DECISION MAKING: Stable/uncomplicated   GOALS: Goals reviewed with patient? Yes  LONG TERM GOALS: Target date: 11/22/21    Final assessment 02/17/23: All goals met for OT lymphedema care     Given this patient's Intake score of 20.59/100% on the Lymphedema Life Impact Scale (LLIS), patient will experience a reduction of at least 5% in her perceived level of functional impairment resulting from lymphedema to improve functional performance and quality of life (QOL). Baseline: 20.59%  Goal status: 10/07/22 PROGRESSING; 12/08/22 Progressing; 02/10/23 GOAL MET w 7.35% reduction 02/17/23 final = 1. 47%. Overall reduction on LLIS measures 19.12%   2.  Given this patient's Intake score of TBA/100% on the functional outcomes FOTO tool, patient will experience an increase in function of 5 points  to improve basic and instrumental ADLs performance, including lymphedema self-care. Baseline:  TBA Goal status:  DEFERRED  3.   Pt will demonstrate understanding of lymphedema precautions and prevention strategies with modified independence using a printed reference to identify at least 5 precautions and discussing how s/he may implement them into daily life to reduce risk of progression and to limit infection risk. Baseline: Max A Goal status:10/07/22 GOAL MET  4.  Pt will be able to don and doff prophylactic compression arm sleeve using assistive devices and extra time (modified independent), and discuss wear and garment care recommendations  to limit lymphedema by issue date. Baseline: dependent Goal status: 09/15/22 GOAL MET  5.  Pt will demonstrate the ability to perform all lymphedema self-care home program components with printed reference sheets (modified independence) by DC to limit lymphedema progression Baseline: dependent Goal status: 12/09/22 PROGRESSING. Awaiting approval of advanced Flexitouch Plus  02/10/23 GOAL MET  6.  Pt will report 0/10 shoulder pain with AROM in all planes at end ranges bilaterally against gravity by DC for optimal functional performance of basic and instrumental ADLs, work and productive activities, leisure pursuits and social participation, Baseline: 2-3/10 Goal status: 12/29/22 ONGOING 02/10/23 GOAL MET  PLAN:  PT FREQUENCY:follow along and PRN  PT DURATION: PRN  PLANNED INTERVENTIONS: Ongoing support PRN for lymphedema self management over time at home  PLAN FOR NEXT SESSION:  3 mo OT LE follow along to assess condition (skin and volumetrics) , ongoing self management compliance and quality, assess garment condition and measure for replacement/s PRN  Loel Dubonnet, MS, OTR/L, CLT-LANA 02/17/23 11:34 AM

## 2023-02-19 ENCOUNTER — Other Ambulatory Visit: Payer: Self-pay | Admitting: *Deleted

## 2023-02-19 ENCOUNTER — Inpatient Hospital Stay: Payer: Commercial Managed Care - HMO

## 2023-02-19 MED ORDER — LIDOCAINE-PRILOCAINE 2.5-2.5 % EX CREA: 1.0000 | TOPICAL_CREAM | CUTANEOUS | 2 refills | Status: DC

## 2023-02-22 ENCOUNTER — Inpatient Hospital Stay (HOSPITAL_BASED_OUTPATIENT_CLINIC_OR_DEPARTMENT_OTHER): Payer: Commercial Managed Care - HMO | Admitting: Oncology

## 2023-02-22 ENCOUNTER — Encounter: Payer: Self-pay | Admitting: Oncology

## 2023-02-22 ENCOUNTER — Inpatient Hospital Stay: Payer: Commercial Managed Care - HMO

## 2023-02-22 VITALS — BP 126/74 | HR 68 | Temp 98.5°F | Resp 18 | Ht 69.0 in | Wt 210.7 lb

## 2023-02-22 DIAGNOSIS — Z95828 Presence of other vascular implants and grafts: Secondary | ICD-10-CM

## 2023-02-22 DIAGNOSIS — Z171 Estrogen receptor negative status [ER-]: Secondary | ICD-10-CM | POA: Diagnosis not present

## 2023-02-22 DIAGNOSIS — R7401 Elevation of levels of liver transaminase levels: Secondary | ICD-10-CM | POA: Diagnosis not present

## 2023-02-22 DIAGNOSIS — C50412 Malignant neoplasm of upper-outer quadrant of left female breast: Secondary | ICD-10-CM | POA: Diagnosis not present

## 2023-02-22 LAB — COMPREHENSIVE METABOLIC PANEL
ALT: 35 U/L (ref 0–44)
AST: 36 U/L (ref 15–41)
Albumin: 4.1 g/dL (ref 3.5–5.0)
Alkaline Phosphatase: 85 U/L (ref 38–126)
Anion gap: 9 (ref 5–15)
BUN: 13 mg/dL (ref 6–20)
CO2: 23 mmol/L (ref 22–32)
Calcium: 9.1 mg/dL (ref 8.9–10.3)
Chloride: 103 mmol/L (ref 98–111)
Creatinine, Ser: 0.74 mg/dL (ref 0.44–1.00)
GFR, Estimated: >60 mL/min (ref >60)
Glucose, Bld: 92 mg/dL (ref 70–99)
Potassium: 3.9 mmol/L (ref 3.5–5.1)
Sodium: 135 mmol/L (ref 135–145)
Total Bilirubin: 0.4 mg/dL (ref 0.3–1.2)
Total Protein: 7.1 g/dL (ref 6.5–8.1)

## 2023-02-22 LAB — CBC WITH DIFFERENTIAL/PLATELET
Abs Immature Granulocytes: 0.02 10*3/uL (ref 0.00–0.07)
Basophils Absolute: 0 10*3/uL (ref 0.0–0.1)
Basophils Relative: 0 %
Eosinophils Absolute: 0.1 10*3/uL (ref 0.0–0.5)
Eosinophils Relative: 2 %
HCT: 39 % (ref 36.0–46.0)
Hemoglobin: 13.4 g/dL (ref 12.0–15.0)
Immature Granulocytes: 0 %
Lymphocytes Relative: 15 %
Lymphs Abs: 1 10*3/uL (ref 0.7–4.0)
MCH: 32.3 pg (ref 26.0–34.0)
MCHC: 34.4 g/dL (ref 30.0–36.0)
MCV: 94 fL (ref 80.0–100.0)
Monocytes Absolute: 0.6 10*3/uL (ref 0.1–1.0)
Monocytes Relative: 9 %
Neutro Abs: 5 10*3/uL (ref 1.7–7.7)
Neutrophils Relative %: 74 %
Platelets: 195 10*3/uL (ref 150–400)
RBC: 4.15 MIL/uL (ref 3.87–5.11)
RDW: 12.4 % (ref 11.5–15.5)
WBC: 6.7 10*3/uL (ref 4.0–10.5)
nRBC: 0 % (ref 0.0–0.2)

## 2023-02-22 MED ORDER — SODIUM CHLORIDE 0.9% FLUSH
10.0000 mL | Freq: Once | INTRAVENOUS | Status: AC
  Administered 2023-02-22: 10 mL via INTRAVENOUS
  Filled 2023-02-22: qty 10

## 2023-02-22 MED ORDER — HEPARIN SOD (PORK) LOCK FLUSH 100 UNIT/ML IV SOLN
500.0000 [IU] | Freq: Once | INTRAVENOUS | Status: AC
  Administered 2023-02-22: 500 [IU] via INTRAVENOUS
  Filled 2023-02-22: qty 5

## 2023-02-23 NOTE — Progress Notes (Signed)
Hematology/Oncology Consult note [facility]  [phone] [phone]  Patient Care Team: Dorothey Baseman, MD as PCP - General (Family Medicine) Creig Hines, MD as Consulting Physician (Hematology and Oncology) Dillingham, Alena Bills, DO as Consulting Physician (Plastic Surgery) Carmina Miller, MD as Consulting Physician (Radiation Oncology) Carolan Shiver, MD as Consulting Physician (General Surgery)   Name of the patient: Vickie Mathis  [id number]  Apr 05, 1991   Date of visit: 02/23/23  Diagnosis-  locally advanced 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 breast cancer at least T2 N1 M0 s/p neoadjuvant chemotherapy surgery and pathological complete response    Chief complaint/ Reason for visit-routine follow-up of breast cancer  Heme/Onc history: patient is a 32 year old female who self palpated a left breast mass and sought medical attention recently after thinking it was a possible cyst for all this while.She underwent a diagnostic bilateral mammogram and ultrasound which showed a 2.7 cm mass at the 12 o'clock position 6 cm from the nipple.  Ultrasound of the left axilla demonstrates 2 lymph nodes with mild thickened cortices of 4 mm.  There is skin thickening on the lower inner quadrant of the left breast on mammography.  Both the breast mass and the lymph node were biopsied and was positive for invasive mammary carcinoma grade 3.  Lymph node was also suspicious for extracapsular extension.    ER/PR and HER-2 negative   MRI showed irregular enhancing mass at the 12 o'clock position of the left breast measuring 2.5 x 2.3 cm and a linear component extending 2.3 cm anteriorly.  The mass in the anterior linear extension combined measure 4.3 cm.  3.9 cm in the cephalocaudal dimension.  Also an area of clumped non-mass enhancement in the posterior aspect of the lower quadrant of the left breast measuring 2.6 x 0.9 cm which looks suspicious.  2 enlarged left  axillary lymph nodes and 2 mildly enlarged internal mammary lymph nodes.  4.3 cm clumped area of non-mass enhancement in the outer quadrant of the right breast.  3 right axillary lymph nodes with mild cortical thickening.   Patient underwent biopsy of the right breast non-mass enhancement and that was negative for malignancy   CT scan showed mildly enlarged right inguinal lymph nodes nonspecific.  Left upper breast mass with prominent left axillary lymph nodes.  Subcentimeter pulmonary nodules which are calcified and compatible with benign old granulomatous disease.  0.6 5.4 cm lucent lesion in the left first rib possibly a hemangioma or benign lesion.   Patient developed significant infusion reaction to carboplatin with dose 9 when her blood pressure dropped and she became tachycardic and significantly nauseous.  She went on to complete [facility] Keytruda chemotherapy as per keynote 522 regimen.  Patient underwent bilateral mastectomy with reconstruction in September 2022.  Final pathology showed complete pathological response2 sentinel lymph nodes negative for malignancy.  No malignancy noted in the right breast.   Plan was to complete 9 cycles of adjuvant Keytruda.  Patient noted to have abnormal LFTs on Keytruda.  Last dose of Keytruda given on 10/08/2021.  Subsequently her LFTs were significantly elevated and patient did not receive any further Keytruda.  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  She was on a prolonged course of steroids and is presently on mycophenolate as directed by [facility].  LFTs have normalized    Interval history-patient continues to have symptoms of anxiety and fatigue.  She has baseline peripheral neuropathy.  Also reports neck pain  for which she is seeing occupational therapy.  ECOG PS- 0 Pain scale- 0   Review of systems- Review of Systems  Constitutional:  Positive for malaise/fatigue. Negative for chills, fever and weight loss.  HENT:   Negative for congestion, ear discharge and nosebleeds.   Eyes:  Negative for blurred vision.  Respiratory:  Negative for cough, hemoptysis, sputum production, shortness of breath and wheezing.   Cardiovascular:  Negative for chest pain, palpitations, orthopnea and claudication.  Gastrointestinal:  Negative for abdominal pain, blood in stool, constipation, diarrhea, heartburn, melena, nausea and vomiting.  Genitourinary:  Negative for dysuria, flank pain, frequency, hematuria and urgency.  Musculoskeletal:  Negative for back pain, joint pain and myalgias.  Skin:  Negative for rash.  Neurological:  Positive for sensory change (Peripheral neuropathy). Negative for dizziness, tingling, focal weakness, seizures, weakness and headaches.  Endo/Heme/Allergies:  Does not bruise/bleed easily.  Psychiatric/Behavioral:  Negative for depression and suicidal ideas. The patient does not have insomnia.       Allergies  Allergen Reactions   Carboplatin Shortness Of Breath, Nausea And Vomiting and Other (See Comments)    Flushing- chest , face, neck , arm including hand   Amoxicillin     Other reaction(s): "too young to remember what they do to me"   Sulfa Antibiotics     Other reaction(s): "too young to remember what they do to me"     Past Medical History:  Diagnosis Date   Anxiety    Asthma    Breast cancer (HCC) 11/2020   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 breast ca   Depression    Family history of cancer    History of chemotherapy    Varicose veins of bilateral lower extremities with pain      Past Surgical History:  Procedure Laterality Date   APPENDECTOMY  2018   BILATERAL TOTAL MASTECTOMY WITH AXILLARY LYMPH NODE DISSECTION Bilateral 06/25/2021   Procedure: BILATERAL TOTAL MASTECTOMY WITH LEFT AXILLARY LYMPH NODE BIOPSY VS. AXILLARY NODE DISSECTION;  Surgeon: Carolan Shiver, MD;  Location: [facility] ORS;  Service: General;  Laterality: Bilateral;  Dillingham, 1.5 hours Cintron-Diaz 2.5 hours    BREAST BIOPSY Left 11/26/2020   vision 12:00 6cmfn [facility]   BREAST BIOPSY Left 11/26/2020   LN bx, hydro marker,  fragments of macrometastatic carcinoma   BREAST RECONSTRUCTION WITH PLACEMENT OF TISSUE EXPANDER AND FLEX HD (ACELLULAR HYDRATED DERMIS) Bilateral 06/25/2021   Procedure: BREAST RECONSTRUCTION WITH PLACEMENT OF TISSUE EXPANDER AND FLEX HD (ACELLULAR HYDRATED DERMIS);  Surgeon: Peggye Form, DO;  Location: [facility] ORS;  Service: Plastics;  Laterality: Bilateral;   PORTACATH PLACEMENT Right 12/13/2020   Procedure: INSERTION PORT-A-CATH;  Surgeon: Carolan Shiver, MD;  Location: [facility] ORS;  Service: General;  Laterality: Right;   REMOVAL OF BILATERAL TISSUE EXPANDERS WITH PLACEMENT OF BILATERAL BREAST IMPLANTS Bilateral 09/15/2021   Procedure: REMOVAL OF BILATERAL TISSUE EXPANDERS;  Surgeon: Peggye Form, DO;  Location: [facility];  Service: Plastics;  Laterality: Bilateral;    Social History   Socioeconomic History   Marital status: Married    Spouse name: Not on file   Number of children: Not on file   Years of education: Not on file   Highest education level: Not on file  Occupational History   Not on file  Tobacco Use   Smoking status: Never   Smokeless tobacco: Never  Vaping Use   Vaping Use: Never used  Substance and Sexual Activity   Alcohol use: Not Currently  Comment: occassinally    Drug use: Yes    Types: Marijuana    Comment: occ   Sexual activity: Yes  Other Topics Concern   Not on file  Social History Narrative   Not on file   Social Determinants of Health   Financial Resource Strain: Not on file  Food Insecurity: Not on file  Transportation Needs: Unmet Transportation Needs (01/22/2023)   PRAPARE - Administrator, Civil Service (Medical): Yes    Lack of Transportation (Non-Medical): Yes  Physical Activity: Not on file  Stress: Not on file  Social Connections: Not on file  Intimate Partner Violence: Not  on file    Family History  Problem Relation Age of Onset   Diabetes Father    Varicose Veins Father    Diabetes Paternal Aunt    Uterine cancer Paternal Aunt        precancerous   Diabetes Paternal Grandmother    Uterine cancer Paternal Grandmother        precancerous   Thyroid disease Mother    Thyroid disease Maternal Aunt    Thyroid disease Maternal Grandmother    Cancer Other        stomach vs ovarian/cervical   Cancer Paternal Great-grandmother        unk     Current Outpatient Medications:    acamprosate (CAMPRAL) 333 MG tablet, Take 666 mg by mouth 3 (three) times daily., Disp: , Rfl:    busPIRone (BUSPAR) 15 MG tablet, Take 1 tablet by mouth 2 (two) times daily., Disp: , Rfl:    citalopram (CELEXA) 20 MG tablet, TAKE 1 TABLET BY MOUTH EVERY DAY, Disp: 90 tablet, Rfl: 1   clonazePAM (KLONOPIN) 0.5 MG tablet, Take 1 tablet (0.5 mg total) by mouth 3 (three) times daily as needed., Disp: 60 tablet, Rfl: 0   EMGALITY 120 MG/ML SOAJ, PLEASE SEE ATTACHED FOR DETAILED DIRECTIONS, Disp: , Rfl:    lidocaine-prilocaine (EMLA) cream, Apply 1 Application topically every 14 (fourteen) days., Disp: 30 g, Rfl: 2   Multiple Vitamin (MULTIVITAMIN WITH MINERALS) TABS tablet, Take 1 tablet by mouth daily., Disp: , Rfl:    mycophenolate (CELLCEPT) 250 MG capsule, Take by mouth., Disp: , Rfl:    rizatriptan (MAXALT) 10 MG tablet, Take 1 tab at onset of migraine. May repeat in 2 hours if needed. Max 3/day and 2-3 days/week, Disp: , Rfl:    dapsone 100 MG tablet, TAKE 1 TABLET BY MOUTH EVERY DAY (Patient not taking: Reported on 08/24/2022), Disp: 30 tablet, Rfl: 1   docusate sodium (COLACE) 100 MG capsule, Take 100 mg by mouth daily as needed for mild constipation. (Patient not taking: Reported on 08/24/2022), Disp: , Rfl:    gabapentin (NEURONTIN) 300 MG capsule, Take 1 capsule (300 mg total) by mouth 3 (three) times daily. (Patient taking differently: Take 300 mg by mouth 3 (three) times daily.  PT STATES 600MG  IN PM), Disp: 42 capsule, Rfl: 3   oxyCODONE (OXY IR/ROXICODONE) 5 MG immediate release tablet, Take 1 tablet (5 mg total) by mouth 2 (two) times daily. (Patient not taking: Reported on 08/24/2022), Disp: 60 tablet, Rfl: 0   pantoprazole (PROTONIX) 40 MG tablet, Take 40 mg by mouth daily. (Patient not taking: Reported on 08/24/2022), Disp: , Rfl:    TURMERIC CURCUMIN PO, Take 2 capsules by mouth daily. (Patient not taking: Reported on 08/24/2022), Disp: , Rfl:  No current facility-administered medications for this visit.  Facility-Administered Medications Ordered in Other Visits:  prochlorperazine (COMPAZINE) tablet 10 mg, 10 mg, Oral, Q6H PRN, Creig Hines, MD, 10 mg at 07/16/21 1407  Physical exam:  Vitals:   02/22/23 1113  BP: 126/74  Pulse: 68  Resp: 18  Temp: 98.5 F (36.9 C)  TempSrc: Tympanic  SpO2: 100%  Weight: 210 lb 11.2 oz (95.6 kg)  Height: 5\' 9"  (1.753 m)   Physical Exam Cardiovascular:     Rate and Rhythm: Normal rate and regular rhythm.     Heart sounds: Normal heart sounds.  Pulmonary:     Effort: Pulmonary effort is normal.     Breath sounds: Normal breath sounds.  Abdominal:     General: Bowel sounds are normal.     Palpations: Abdomen is soft.  Skin:    General: Skin is warm and dry.  Neurological:     Mental Status: She is alert and oriented to person, place, and time.   Chest wall exam: Patient is s/p bilateral mastectomy without reconstruction.  No evidence of chest wall recurrence.  No palpable bilateral axillary adenopathy.     Latest Ref Rng & Units 02/22/2023   10:51 AM  CMP  Glucose 70 - 99 mg/dL 92   BUN 6 - 20 mg/dL 13   Creatinine 8.29 - 1.00 mg/dL 5.62   Sodium 130 - 865 mmol/L 135   Potassium 3.5 - 5.1 mmol/L 3.9   Chloride 98 - 111 mmol/L 103   CO2 22 - 32 mmol/L 23   Calcium 8.9 - 10.3 mg/dL 9.1   Total Protein 6.5 - 8.1 g/dL 7.1   Total Bilirubin 0.3 - 1.2 mg/dL 0.4   Alkaline Phos 38 - 126 U/L 85   AST 15 -  41 U/L 36   ALT 0 - 44 U/L 35       Latest Ref Rng & Units 02/22/2023   10:51 AM  CBC  WBC 4.0 - 10.5 K/uL 6.7   Hemoglobin 12.0 - 15.0 g/dL 78.4   Hematocrit 69.6 - 46.0 % 39.0   Platelets 150 - 400 K/uL 195      Assessment and plan- Patient is a 32 y.o. female who is here for routine follow-up of breast cancer  Patient is now little over a year since completing adjuvant Keytruda which was stopped prematurely after 5 cycles due to autoimmune hepatitis.  She had a complete pathological response to neoadjuvant treatment.  No role for routine surveillance scans in triple negative breast cancer.  Clinically she is doing well with no concerning signs and symptoms of recurrence based on today's exam.  Chemo-induced peripheral neuropathy:Continue gabapentin  Anxiety and depression: She is on BuSpar Celexa and as needed clonazepam.  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  I will see her back in 6 months.  We will continue to check her LFTs every 2 months.  They are presently normal   Visit Diagnosis 1. Malignant neoplasm of upper-outer quadrant of left breast in female, estrogen receptor negative (HCC)   2. Transaminitis   3. Port-A-Cath in place      Dr. Owens Shark, MD, MPH [facility] [phone] 02/23/2023 8:54 AM

## 2023-02-26 ENCOUNTER — Encounter: Payer: Self-pay | Admitting: Oncology

## 2023-03-05 ENCOUNTER — Other Ambulatory Visit: Payer: Self-pay | Admitting: *Deleted

## 2023-03-05 ENCOUNTER — Inpatient Hospital Stay: Payer: Commercial Managed Care - HMO

## 2023-03-05 ENCOUNTER — Telehealth: Payer: Self-pay | Admitting: *Deleted

## 2023-03-05 NOTE — Progress Notes (Signed)
Letter for pt.

## 2023-03-05 NOTE — Telephone Encounter (Signed)
Pt came today to to get a paper for her work place about how much she can work at a  time

## 2023-03-31 NOTE — Therapy (Signed)
OUTPATIENT PHYSICAL THERAPY EVALUATION   Patient Name: Vickie Mathis MRN: [id number] DOB:October 26, 1990, 32 y.o., female Today's Date: 04/13/2023  END OF SESSION:  PT End of Session - 04/13/23 1941     Visit Number 1    Number of Visits 13    Date for PT Re-Evaluation 07/06/23    Authorization Type [employer] reporting period from 04/13/2023    Progress Note Due on Visit 10    PT Start Time 1435    PT Stop Time 1515    PT Time Calculation (min) 40 min    Activity Tolerance Patient tolerated treatment well    Behavior During Therapy [facility] for tasks assessed/performed             Past Medical History:  Diagnosis Date   Anxiety    Asthma    Breast cancer (HCC) 11/2020   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 AXILLARY NODE DISSECTION;  Surgeon: Carolan Shiver, MD;  Location: [facility];  Service: General;  Laterality: Bilateral;  [place], 1.5 hours Cintron-Diaz 2.5 hours   BREAST BIOPSY Left 11/26/2020   vision 12:00 6cmfn [facility]   BREAST BIOPSY Left 11/26/2020   LN bx, hydro marker,  fragments of macrometastatic carcinoma   BREAST RECONSTRUCTION WITH PLACEMENT OF TISSUE EXPANDER AND FLEX HD (ACELLULAR HYDRATED DERMIS) Bilateral 06/25/2021   Procedure: BREAST RECONSTRUCTION WITH PLACEMENT OF TISSUE EXPANDER AND FLEX HD (ACELLULAR HYDRATED DERMIS);  Surgeon: Peggye Form, DO;  Location: [facility];  Service: Plastics;  Laterality: Bilateral;   PORTACATH PLACEMENT Right 12/13/2020   Procedure: INSERTION PORT-A-CATH;  Surgeon: Carolan Shiver, MD;  Location: [facility];  Service: General;  Laterality: Right;   REMOVAL  OF BILATERAL TISSUE EXPANDERS WITH PLACEMENT OF BILATERAL BREAST IMPLANTS Bilateral 09/15/2021   Procedure: REMOVAL OF BILATERAL TISSUE EXPANDERS;  Surgeon: Peggye Form, DO;  Location: [facility];  Service: Plastics;  Laterality: Bilateral;   Patient Active Problem List   Diagnosis Date Noted   Chemotherapy-induced neuropathy (HCC) 02/06/2022   Right sided weakness 02/06/2022   Transaminitis 11/20/2021   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 10/25/2021   Malignant neoplasm of left breast in female, estrogen receptor positive (HCC) 06/25/2021   Genetic testing 01/15/2021   Family history of cancer    Invasive carcinoma of breast (HCC) 12/03/2020   Breast mass, left 07/01/2020   Situational mixed anxiety and depressive disorder 07/01/2020   Anxiety state 08/06/2014   Varicose veins of lower extremity 08/06/2014    PCP: Evelena Peat, PA  REFERRING PROVIDER: Evelena Peat, [place]  REFERRING DIAG: migraine without aura and without status migrainosus, not intractable   THERAPY DIAG:  Cervicalgia  Neuralgia and neuritis  Muscle weakness (generalized)  Nonintractable headache, unspecified chronicity pattern, unspecified headache type  Rationale for Evaluation and Treatment: {HABREHAB:27488}  ONSET DATE: ***  SUBJECTIVE:  SUBJECTIVE STATEMENT: Patient reports she is returning to PT for neck pain and paresthesia in her bilateral UE.  She feels like she always had some neck pain but her doctors did not really pay much attention to it until she had chemo for breast cancer and her hands started tingling. She states it runs in her family and they all have trouble with C5-C6. She states  she was told it was severe foramenal stenosis at that level but a  neurologist looked at it and said it was more moderate. 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 She has trouble with fine motor skills. She does not grasp things like she used to. She states she does get head aches. She is getting treated at [facility] for migraines. She recent started cymbalta that was to also help with neck pain. When she sleeps wrong she cannot even turn her head. This happens about once a month and she usually cannot turn her head to the right. She states her doctors have mentioned the possibility of a steroid injection to her neck, but she has not set that up yet. She wakes up with her hands numb but she can shake it out and change positions and it is better.   She had a nerve conduction test (with Dr. Myer Haff?) that showed mild carpal tunnel syndrome but not much nerve damage Not enough to explain the symptoms she has been having.    Hand dominance: Right  PERTINENT HISTORY:  Patient is a 32 y.o. female who presents to outpatient physical therapy with a referral for medical diagnosis migraine without aura and without status migrainosus, not intractable. This patient's chief complaints consist of ***, leading to the following functional deficits: ***. 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  Patient denies hx of stroke, seizures, lung problems, heart problems, diabetes, unexplained weight loss, unexplained changes in bowel or bladder problems, osteoporosis, and spinal surgery   PAIN:  Are you having pain? Yes NPRS: Current: 3/10,  Best:  2/10, Worst: 8-9/10. Pain location: R right sided neck pain that travels up towards the base of the scull. It does not go below shoulder or down back, She has numbness and tingling most predominately in her bilateral hands and finger tips, notices more in the right hand than the left. Numbness is slightly in her forearms, but no numbness or pain connecting the neck pain to her hands.  Pain description: numbness/tingling, pain is dull Aggravating factors: pushing on her left upper trap region, sleeping wrong, sleeping with her wrists curled up, using B UE, crochet, crafting, working,  Relieving factors: shaking her hands, nerve flossing, heat, really conscious of posture  FUNCTIONAL LIMITATIONS: sleeping, using B UE, crochet, crafting, working, awkwards prolonged posture.   LEISURE: crochet, crafting, cancer center activities  PRECAUTIONS: None, supposed to wear a sleeve on the left when doing "high risk activities" for lymphedema (she is stage 0).   WEIGHT BEARING RESTRICTIONS: No  FALLS:  Has patient fallen in last 6 months? Yes. Number of falls 2 Larey Seat over a hose on 4th of July, the other one she fell into a gopher hole in the dark.  She is not  worried about falls and her balance.   LIVING ENVIRONMENT: Lives with: spouse Lives in: flat apartment Stairs: 8 steps to enter, right handrail Does not use any assistive devices.   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   PLOF: Independent  PATIENT GOALS: Learning how to manage symptoms or manage them better. Strength. To get better. To control it so it doesn't get any worse.   OBJECTIVE  DIAGNOSTIC FINDINGS:  Head CT report from 12/02/2022:  CLINICAL DATA:  Larey Seat 2 weeks ago and hit right eye.  Headache.   EXAM: CT HEAD WITHOUT CONTRAST   TECHNIQUE: Contiguous axial images were obtained from the base of the skull through the vertex without intravenous contrast.   RADIATION DOSE  REDUCTION: This exam was performed according to the departmental dose-optimization program which includes automated exposure control, adjustment of the mA and/or kV according to patient size and/or use of iterative reconstruction technique.   COMPARISON:  MRI brain 02/08/2022   FINDINGS: Brain: No evidence of acute infarction, hemorrhage, hydrocephalus, extra-axial collection or mass lesion/mass effect.   Vascular: No hyperdense vessel or unexpected calcification.   Skull: Normal. Negative for fracture or focal lesion.   Sinuses/Orbits: No acute finding.   Other: None.   IMPRESSION: No acute intracranial abnormality.     Electronically Signed   By: Darliss Cheney M.D.   On: 12/02/2022 16:40   Cervical spine and head MRI report from 02/08/2022:  CLINICAL DATA:  Right-sided weakness.  Breast carcinoma.   EXAM: MRI HEAD WITHOUT AND WITH CONTRAST   MRI CERVICAL SPINE WITHOUT AND WITH CONTRAST   TECHNIQUE: Multiplanar, multiecho pulse sequences of the brain and surrounding structures, and cervical spine, to include the craniocervical junction and cervicothoracic junction, were obtained without and with intravenous contrast.   CONTRAST:  10mL GADAVIST GADOBUTROL 1 MMOL/ML IV SOLN   COMPARISON:  None Available.   FINDINGS: MRI HEAD FINDINGS   Brain: No acute infarct, mass effect or extra-axial collection. No acute or chronic hemorrhage. Normal white matter signal, parenchymal volume and CSF spaces. The midline structures are normal. There is no abnormal contrast enhancement.   Vascular: Major flow voids are preserved.   Skull and upper cervical spine: Normal calvarium and skull base. Visualized upper cervical spine and soft tissues are normal.   Sinuses/Orbits:No paranasal sinus fluid levels or advanced mucosal thickening. No mastoid or middle ear effusion. Normal orbits.   MRI CERVICAL SPINE FINDINGS   Alignment: Physiologic.   Vertebrae: No fracture, evidence  of discitis, or bone lesion.   Cord: Normal signal and morphology.   Posterior Fossa, vertebral arteries, paraspinal tissues: Negative.   Disc levels:   C1-2: Unremarkable.   C2-3: Normal disc space and facet joints. There is no spinal canal stenosis. No neural foraminal stenosis.   C3-4: Small central disc protrusion mild right facet arthrosis. There is no spinal canal stenosis. No neural foraminal stenosis.   C4-5: Normal disc space and facet joints. There is no spinal canal stenosis. No neural foraminal stenosis.   C5-6: Left foraminal disc protrusion with bilateral uncovertebral hypertrophy. There is no spinal canal stenosis. Severe left neural foraminal stenosis.   C6-7: Normal disc space and facet joints. There is no spinal canal stenosis. No neural foraminal stenosis.   C7-T1: Normal disc space and facet joints. There is no spinal canal stenosis. No neural foraminal stenosis.   IMPRESSION: 1. Normal MRI of the brain. No metastatic disease to the cervical spine. 2.  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    EMG & NCV Findings (as listed in Dr. Sherryll Burger note from 03/25/2022:  EMG & NCV Findings: Evaluation of the Left median/ulnar (palm) comparison nerve showed abnormal peak latency difference (Median Palm-Ulnar Palm, 0.4 ms). All remaining nerves (as indicated in the following tables) were within normal limits.  EMG  Side Muscle Nerve Root Ins Act Fibs Psw Amp Dur Poly Recrt Int Dennie Bible Comment  Right Abd Poll Brev Median C8-T1 Nml Nml Nml Nml Nml 0 Nml Nml  Right 1stDorInt Ulnar C8-T1 Nml Nml Nml Nml Nml 0 Nml Nml  Right ExtDigCom Radial (Post Int) C7-8 Nml Nml Nml Nml Nml 0 Nml Nml  Right PronatorTeres Median C6-7 Nml Nml Nml Nml Nml 0 Nml Nml  Right Biceps Musculocut C5-6 Nml Nml Nml Nml Nml 0 Nml Nml  Left Abd Poll Brev Median C8-T1 Nml Nml Nml Nml Nml 0 Nml Nml  Left 1stDorInt Ulnar C8-T1  Nml Nml Nml Nml Nml 0 Nml Nml  Left ExtDigCom Radial (Post Int) C7-8 Nml Nml Nml Nml Nml 0 Nml Nml  Left PronatorTeres Median C6-7 Nml Nml Nml Nml Nml 0 Nml Nml  Left Biceps Musculocut C5-6 Nml Nml Nml Nml Nml 0 Nml Nml   Impression: This is an abnormal electrodiagnostic exam consistent with 1) bilateral minimal (grade I) carpal tunnel syndrome (median nerve entrapment at wrist). 2) There was clinical concern for peripheral neuropathy but NCS did not support large fiber peripheral neuropathy. Patient's with small fiber neuropathy may have normal nerve conduction studies, which primarily evaluates small nerve fibers.   SELF- REPORTED FUNCTION FOTO score: ***/100 (neck questionnaire)  OBSERVATION/INSPECTION Posture Posture (seated): WFL Posture (standing): *** Posture correction: *** Anthropometrics Tremor: none Body composition: *** Muscle bulk: *** Skin: The incision sites appear to be healing well with no excessive redness, warmth, drainage or signs of infection present.  *** Edema: *** Functional Mobility Bed mobility: *** Transfers: *** Gait: grossly WFL for household and short community ambulation. More detailed gait analysis deferred to later date as needed. *** Stairs: ***  SPINE MOTION  LUMBAR SPINE AROM *Indicates pain Flexion: *** Extension: *** Side Flexion:   R ***  L *** Rotation:  R *** L *** Side glide:  R *** L ***   NEUROLOGICAL  Upper Motor Neuron Screen Babinski, Hoffman's and Clonus (ankle) negative bilaterally.  Dermatomes C2-T1 appears equal and intact to light touch except the following: L more sensitive with paresthesia at C6 and T1 compared to right, B increased paresthesia at C8.   Deep Tendon Reflexes R/L  2+/1+ Biceps brachii reflex (C5, C6) 0+/0+ Triceps brachii reflex (C7)  SPINE MOTION  CERVICAL SPINE AROM *Indicates pain Flexion: 58 increased pain at bilateral UT Extension: 51 increased pain at base of scull, noted for most  of motion coming from sub-capital region.  Side Flexion:   R 27 increased pain under right ear, pulling at left upper trap  L 25 Rotation:  R 55 L 54  PERIPHERAL JOINT MOTION (in degrees) ACTIVE RANGE OF MOTION (AROM) Comments:  04/13/2023: B UE grossly WFL for basic mobility, but pateint states it feels weak and "weird" in her B UE. She had pain/discomfort around her back and shoulders with shoulder motion.   SPECIAL TESTS: CERVICAL SPINE Cervical spine axial compression: negative Spurling's part B:  R = localized pain at base of right neck, L = negative  ACCESSORY MOTION: Tender to [employer]  at mid cervical spine.   PALPATION: TTP at bilateral suboccipital region, R > L UT with reproduction of R neck pain, feels good at cervical spine paraspinals. Possibles slight increase in R UE symptoms with palpation to R UT.    TODAY'S TREATMENT:   ***  PATIENT EDUCATION:  Education details: *** Person educated: {Person educated:25204} Education method: {Education Method:25205} Education comprehension: {Education Comprehension:25206}  HOME EXERCISE PROGRAM: Access Code: [email or web address] URL: [email or web address] Date: 04/13/2023 Prepared by: Norton Blizzard  Exercises - Supine Deep Neck Flexor Nods  - 1 x daily - 2 sets - 10 reps - 5-10 second  hold  ASSESSMENT:  CLINICAL IMPRESSION: Patient is a 32 y.o. female referred to outpatient physical therapy with a medical diagnosis of migraine without aura and without status migrainosus, not intractable who presents with signs and symptoms consistent with ***. Patient presents with significant *** impairments that are limiting ability to complete *** without difficulty. Patient will benefit from skilled physical therapy intervention to address current body structure impairments and activity limitations to improve function and work towards goals set in current POC in order to return to prior level of function or maximal functional  improvement.   OBJECTIVE IMPAIRMENTS: {opptimpairments:25111}.   ACTIVITY LIMITATIONS: {activitylimitations:27494}  PARTICIPATION LIMITATIONS: {participationrestrictions:25113}  PERSONAL FACTORS: {Personal factors:25162} are also affecting patient's functional outcome.   REHAB POTENTIAL: {rehabpotential:25112}  CLINICAL DECISION MAKING: {clinical decision making:25114}  EVALUATION COMPLEXITY: {Evaluation complexity:25115}    GOALS: Goals reviewed with patient? No  SHORT TERM GOALS: Target date: 04/27/2023  Patient will be independent with initial home exercise program for self-management of symptoms. Baseline: {HEPbaseline4:27310} (04/13/23); Goal status: INITIAL   LONG TERM GOALS: Target date: 07/06/2023  Patient will be independent with a long-term home exercise program for self-management of symptoms.  Baseline: {HEPbaseline4:27310} (04/13/23); Goal status: INITIAL  2.  Patient will demonstrate improved FOTO to equal or greater than *** by visit #*** to demonstrate improvement in overall condition and self-reported functional ability.  Baseline: *** (04/13/23); Goal status: INITIAL  3.  *** Baseline: *** (04/13/23); Goal status: INITIAL  4.  *** Baseline: *** (04/13/23); Goal status: INITIAL  5.  Patient will complete community, work and/or recreational activities without limitation due to current condition.  Baseline: *** (04/13/23); Goal status: INITIAL  6.  *** Baseline: *** Goal status: INITIAL    PLAN:  PT FREQUENCY: {rehab frequency:25116}  PT DURATION: {rehab duration:25117}  PLANNED INTERVENTIONS: {rehab planned interventions:25118::"Therapeutic exercises","Therapeutic activity","Neuromuscular re-education","Balance training","Gait training","Patient/Family education","Self Care","Joint mobilization"}  PLAN FOR NEXT SESSION: ***   Cira Rue, PT, DPT 04/13/2023, 7:49 PM  [facility] [address] [place], [place] [address] P: [phone] I F: [phone]

## 2023-04-13 ENCOUNTER — Encounter: Payer: Self-pay | Admitting: Physical Therapy

## 2023-04-13 ENCOUNTER — Ambulatory Visit: Payer: Commercial Managed Care - HMO | Attending: Medical | Admitting: Physical Therapy

## 2023-04-13 DIAGNOSIS — M6281 Muscle weakness (generalized): Secondary | ICD-10-CM | POA: Insufficient documentation

## 2023-04-13 DIAGNOSIS — R519 Headache, unspecified: Secondary | ICD-10-CM | POA: Diagnosis present

## 2023-04-13 DIAGNOSIS — M792 Neuralgia and neuritis, unspecified: Secondary | ICD-10-CM | POA: Insufficient documentation

## 2023-04-13 DIAGNOSIS — M542 Cervicalgia: Secondary | ICD-10-CM | POA: Diagnosis present

## 2023-04-14 NOTE — Therapy (Signed)
OUTPATIENT PHYSICAL THERAPY TRAITEMENT NOTE   Patient Name: Vickie Mathis MRN: [id number] DOB:1990/12/17, 32 y.o., female Today's Date: 04/15/2023  END OF SESSION:  PT End of Session - 04/15/23 1640     Visit Number 2    Number of Visits 13    Date for PT Re-Evaluation 07/06/23    Authorization Type [employer] reporting period from 04/13/2023    Progress Note Due on Visit 10    PT Start Time 1605    PT Stop Time 1650    PT Time Calculation (min) 45 min    Activity Tolerance Patient tolerated treatment well    Behavior During Therapy [facility] for tasks assessed/performed              Past Medical History:  Diagnosis Date   Anxiety    Asthma    Breast cancer (HCC) 11/2020   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 AXILLARY NODE DISSECTION;  Surgeon: Carolan Shiver, MD;  Location: [facility];  Service: General;  Laterality: Bilateral;  [place], 1.5 hours Cintron-Diaz 2.5 hours   BREAST BIOPSY Left 11/26/2020   vision 12:00 6cmfn [facility]   BREAST BIOPSY Left 11/26/2020   LN bx, hydro marker,  fragments of macrometastatic carcinoma   BREAST RECONSTRUCTION WITH PLACEMENT OF TISSUE EXPANDER AND FLEX HD (ACELLULAR HYDRATED DERMIS) Bilateral 06/25/2021   Procedure: BREAST RECONSTRUCTION WITH PLACEMENT OF TISSUE EXPANDER AND FLEX HD (ACELLULAR HYDRATED DERMIS);  Surgeon: Peggye Form, DO;  Location: [facility];  Service: Plastics;  Laterality: Bilateral;   PORTACATH PLACEMENT Right 12/13/2020   Procedure: INSERTION PORT-A-CATH;  Surgeon: Carolan Shiver, MD;  Location: [facility];  Service: General;  Laterality: Right;    REMOVAL OF BILATERAL TISSUE EXPANDERS WITH PLACEMENT OF BILATERAL BREAST IMPLANTS Bilateral 09/15/2021   Procedure: REMOVAL OF BILATERAL TISSUE EXPANDERS;  Surgeon: Peggye Form, DO;  Location: [facility];  Service: Plastics;  Laterality: Bilateral;   Patient Active Problem List   Diagnosis Date Noted   Chemotherapy-induced neuropathy (HCC) 02/06/2022   Right sided weakness 02/06/2022   Transaminitis 11/20/2021   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 10/25/2021   Malignant neoplasm of left breast in female, estrogen receptor positive (HCC) 06/25/2021   Genetic testing 01/15/2021   Family history of cancer    Invasive carcinoma of breast (HCC) 12/03/2020   Breast mass, left 07/01/2020   Situational mixed anxiety and depressive disorder 07/01/2020   Anxiety state 08/06/2014   Varicose veins of lower extremity 08/06/2014    PCP: Evelena Peat, PA  REFERRING PROVIDER: Evelena Peat, [place]  REFERRING DIAG: migraine without aura and without status migrainosus, not intractable   THERAPY DIAG:  Cervicalgia  Neuralgia and neuritis  Muscle weakness (generalized)  Nonintractable headache, unspecified chronicity pattern, unspecified headache type  Rationale for Evaluation and Treatment: Rehabilitation  ONSET DATE: neck pain chronic over many years, hand paresthesia onset with cancer treatment in 2022  PERTINENT HISTORY:  Patient is a 32 y.o. female who presents to outpatient physical therapy with a referral for medical diagnosis migraine without aura and without status migrainosus, not intractable. This patient's chief  complaints consist of chronic neck pain (R > L) and migriane headaches and bilateral hand/forearm paresthesia (R > L, C8/ulnar nerve distribution worse) leading to the following functional deficits: difficulty with activities that require use B UE, activities with fine motor control, sorting mail/paperwork, crochet, crafting,  working, activities with awkward or prolonged postures. 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  Patient denies hx of stroke, seizures, lung problems, heart problems, diabetes, unexplained weight loss, unexplained changes in bowel or bladder problems, osteoporosis, and spinal surgery.   SUBJECTIVE:                                                                                                                                                                                                         SUBJECTIVE STATEMENT: Patient states she is feeling okay. She took a tramadol earlier so her neck is feeling pretty good. She worked today.  She also did her HEP without a problem. She was not too sore after her initial evaluation.   PAIN:  NPRS: 2-3/10 at the posterior neck  PATIENT GOALS: Learning how to manage symptoms or manage them better. Strength. To get better. To control it so it doesn't get any worse.   OBJECTIVE  MUSCLE PERFORMANCE (MMT):  *Indicates pain 04/15/23 Date Date  Joint/Motion R/L R/L R/L  Shoulder     Flexion 4+/4+ / /  Abduction (C5) 5/5 / /  External rotation 5/5 / /  Internal rotation 4+/4+ / /  Extension / / /  Elbow     Flexion (C6) 4+/4+ / /  Extension (C7) 5/5 / /  Hand     Thumb extension (C8) B WNL / /  Finger abduction (T1) B WNL / /  Comments:  04/15/2023: pain in chest with B shoulder IR.   Grip strength (in pounds, average of three measures).  R: (87+86+80)/3 = 84.3 L: (90+70+75)/ 3 = 78.3   TODAY'S TREATMENT:   Therapeutic exercise: to centralize symptoms  and improve ROM, strength, muscular endurance, and activity tolerance required for successful completion of functional activities.  - standing scapular rows, 3x10 with 15#. 1x5 with BlueTB. - further testing (see above) - education about sensory activities for hands at home.  - hooklying deep neck flexor nod, 1x10 with 10  second hold (cuing to decrease activation of SCM)  - Sidelying open book (thoracic rotation) to improve thoracic, shoulder girdle, and upper trunk mobility. - Education on HEP including handout   Pt required multimodal cuing for proper technique and to facilitate improved neuromuscular control, strength, range of motion, and functional ability resulting in improved performance and form.  PATIENT EDUCATION:  Education details: Exercise purpose/form. Self management techniques. Education on diagnosis, prognosis, POC, anatomy and physiology of current condition Education on HEP including handout  Person educated: Patient Education method: Explanation, Demonstration, Tactile cues, Verbal cues, and Handouts Education comprehension: verbalized understanding, returned demonstration, and needs further education  HOME EXERCISE PROGRAM: Access Code: [email or web address] URL: [email or web address] Date: 04/15/2023 Prepared by: Norton Blizzard  Exercises - Supine Deep Neck Flexor Nods  - 1 x daily - 2 sets - 10 reps - 5-10 second  hold - Standing Bilateral Low Shoulder Row with Anchored Resistance  - 1 x daily - 3 sets - 10 reps - Sidelying Thoracic Rotation with Open Book  - 1 x daily - 1-2 sets - 10 reps - 5 seconds/1 breath hold  ASSESSMENT:  CLINICAL IMPRESSION: Patient arrives with good tolerance to initial evaluation and HEP. Today's session focused on getting baseline strength measurements of B UE and progressing postural strengthening and stretching exercises. Patient also appeared to benefit from manual therapy to the cervical spine to help decrease tension and pain.  Patient would like to undergo dry needling next session. Patient would benefit from continued management of limiting condition by skilled physical therapist to address remaining impairments and functional limitations to work towards stated goals and return to PLOF or maximal functional independence.   From Initial PT Evaluation 04/13/2023:  Patient is a 32 y.o. female referred to outpatient physical therapy with a medical diagnosis of migraine without aura and without status migrainosus, not intractable who presents with signs and symptoms consistent with chronic neck pain and intermittent migraines bilateral hand and forearm paresthesia in glove pattern with worst symptoms at 5th digit. Patient's hand symptoms continue to seem best explained as chemotherapy-induced peripheral neuropathy which is often unable to be detected on nerve conduction testing despite being clinically present due to the fine nerves being affected, while nerve conduction studies are only able to measure changes in large myelinated nerve fibers. Unable to make connection between neck and hand symptoms during exam. Will continue to be alert for signs of radiculopathy and address accordingly. Patient has potential for improved hand strength and control with physical therapy despite the presence of paresthesia. Upon exam, patient was very tender to palpation at suboccipital region which can be a source of cervicogenic contribution to headache or migraine trigger.  Patient presents with significant pain, ROM, joint stiffness, muscle tension, paresthesia, motor control, muscle performance (strength/power/endurance) and activity tolerance impairments that are limiting ability to complete her usual activities such as those that require use B UE, activities with fine motor control, sorting mail/paperwork, crochet, crafting, working, activities with awkward or prolonged postures without difficulty. Patient will benefit from skilled physical therapy  intervention to address current body structure impairments and activity limitations to improve function and work towards goals set in current POC in order to return to prior level of function or maximal functional improvement.   OBJECTIVE IMPAIRMENTS: decreased activity tolerance, decreased coordination, decreased endurance, decreased knowledge of condition, decreased ROM, decreased strength, impaired perceived functional ability, increased muscle spasms, impaired flexibility, impaired sensation, impaired UE functional use, and pain.   ACTIVITY LIMITATIONS: hygiene/grooming and  difficulty with activities that require use B UE, activities with fine motor control, sorting mail/paperwork, crochet, crafting, working, activities with awkward or prolonged postures  PARTICIPATION LIMITATIONS: meal prep, cleaning, interpersonal relationship, and occupation  PERSONAL FACTORS: Past/current experiences, Profession, Time since onset of injury/illness/exacerbation, and 3+ comorbidities:   Anxiety state; Breast mass, left; Situational mixed anxiety and depressive disorder; Varicose veins of lower extremity; Invasive carcinoma of breast (2022);  Malignant neoplasm of left breast in female, estrogen receptor positive (HCC); 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   REHAB POTENTIAL: Good  CLINICAL DECISION MAKING: Evolving/moderate complexity  EVALUATION COMPLEXITY: Moderate    GOALS: Goals reviewed with patient? No  SHORT TERM GOALS: Target date: 04/27/2023  Patient will be independent with initial home exercise program for self-management of symptoms. Baseline: Initial HEP provided at IE (04/13/23); Goal status: In-progress   LONG TERM GOALS: Target date: 07/06/2023  Patient will be independent with a long-term home exercise program for  self-management of symptoms.  Baseline: Initial HEP provided at IE (04/13/23); Goal status: In-progress  2.  Patient will demonstrate improved FOTO to equal or greater than 69 by visit #10 to demonstrate improvement in overall condition and self-reported functional ability.  Baseline: 59 (04/13/23); Goal status: In-progress  3.  Patient will demonstrate cervical spine rotation equal or greater than 60 degrees bilaterally without increase in pain to improve her ability to check blind spot while driving.  Baseline: R/L 55/54 degrees (04/13/23); Goal status: In-progress  4.  Patient will demonstrate improved grip strength by 5lbs each in both UE to demonstrate improvement in B UE strength and ability to use hands for manual tasks such as working.  Baseline: to be measured visit 2 as appropriate (04/13/23); Goal status: In-progress  5.  Patient will complete community, work and/or recreational activities with 50% less limitation due to current condition.  Baseline: difficulty with activities that require use B UE, activities with fine motor control, sorting mail/paperwork, crochet, crafting, working, activities with awkward or prolonged postures (04/13/23); Goal status: In-progress   PLAN:  PT FREQUENCY: 1-2x/week  PT DURATION: 12 weeks  PLANNED INTERVENTIONS: Therapeutic exercises, Therapeutic activity, Neuromuscular re-education, Patient/Family education, Self Care, Joint mobilization, Dry Needling, Electrical stimulation, Spinal mobilization, Cryotherapy, Moist heat, Manual therapy, and Re-evaluation  PLAN FOR NEXT SESSION: progressive neck/posture/UE/functional strengthening, motor control, and ROM exercises as tolerated. Education. Manual therapy and dry needling as needed. Neurodynamics as appropriate.    Cira Rue, PT, DPT 04/15/2023, 4:59 PM  [facility] [address] [place], [place] [address] P: [phone] I F: [phone]

## 2023-04-15 ENCOUNTER — Encounter: Payer: Self-pay | Admitting: Physical Therapy

## 2023-04-15 ENCOUNTER — Ambulatory Visit: Payer: Commercial Managed Care - HMO | Admitting: Physical Therapy

## 2023-04-15 DIAGNOSIS — M542 Cervicalgia: Secondary | ICD-10-CM

## 2023-04-15 DIAGNOSIS — R519 Headache, unspecified: Secondary | ICD-10-CM

## 2023-04-15 DIAGNOSIS — M792 Neuralgia and neuritis, unspecified: Secondary | ICD-10-CM

## 2023-04-15 DIAGNOSIS — M6281 Muscle weakness (generalized): Secondary | ICD-10-CM

## 2023-04-19 ENCOUNTER — Ambulatory Visit: Payer: Commercial Managed Care - HMO | Admitting: Physical Therapy

## 2023-04-19 ENCOUNTER — Encounter: Payer: Self-pay | Admitting: Physical Therapy

## 2023-04-19 ENCOUNTER — Encounter: Payer: Commercial Managed Care - HMO | Admitting: Physical Therapy

## 2023-04-19 DIAGNOSIS — M542 Cervicalgia: Secondary | ICD-10-CM | POA: Diagnosis not present

## 2023-04-19 DIAGNOSIS — M792 Neuralgia and neuritis, unspecified: Secondary | ICD-10-CM

## 2023-04-19 DIAGNOSIS — M6281 Muscle weakness (generalized): Secondary | ICD-10-CM

## 2023-04-19 DIAGNOSIS — R519 Headache, unspecified: Secondary | ICD-10-CM

## 2023-04-19 NOTE — Therapy (Signed)
OUTPATIENT PHYSICAL THERAPY TRAITEMENT NOTE   Patient Name: Vickie Mathis MRN: [id number] DOB:[birth year], 32 y.o., female Today's Date: 04/19/2023  END OF SESSION:  PT End of Session - 04/19/23 1748     Visit Number 3    Number of Visits 13    Date for PT Re-Evaluation 07/06/23    Authorization Type [employer] reporting period from 04/13/2023    Progress Note Due on Visit 10    PT Start Time 1622    PT Stop Time 1703    PT Time Calculation (min) 41 min    Activity Tolerance Patient tolerated treatment well    Behavior During Therapy [facility] for tasks assessed/performed               Past Medical History:  Diagnosis Date   Anxiety    Asthma    Breast cancer (HCC) 11/2020   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 AXILLARY NODE DISSECTION;  Surgeon: Carolan Shiver, MD;  Location: [facility];  Service: General;  Laterality: Bilateral;  [place], 1.5 hours Cintron-Diaz 2.5 hours   BREAST BIOPSY Left 11/26/2020   vision 12:00 6cmfn [facility]   BREAST BIOPSY Left 11/26/2020   LN bx, hydro marker,  fragments of macrometastatic carcinoma   BREAST RECONSTRUCTION WITH PLACEMENT OF TISSUE EXPANDER AND FLEX HD (ACELLULAR HYDRATED DERMIS) Bilateral 06/25/2021   Procedure: BREAST RECONSTRUCTION WITH PLACEMENT OF TISSUE EXPANDER AND FLEX HD (ACELLULAR HYDRATED DERMIS);  Surgeon: Peggye Form, DO;  Location: [facility];  Service: Plastics;  Laterality: Bilateral;   PORTACATH PLACEMENT Right 12/13/2020   Procedure: INSERTION PORT-A-CATH;  Surgeon: Carolan Shiver, MD;  Location: [facility];  Service: General;  Laterality: Right;    REMOVAL OF BILATERAL TISSUE EXPANDERS WITH PLACEMENT OF BILATERAL BREAST IMPLANTS Bilateral 09/15/2021   Procedure: REMOVAL OF BILATERAL TISSUE EXPANDERS;  Surgeon: Peggye Form, DO;  Location: [facility];  Service: Plastics;  Laterality: Bilateral;   Patient Active Problem List   Diagnosis Date Noted   Chemotherapy-induced neuropathy (HCC) 02/06/2022   Right sided weakness 02/06/2022   Transaminitis 11/20/2021   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 10/25/2021   Malignant neoplasm of left breast in female, estrogen receptor positive (HCC) 06/25/2021   Genetic testing 01/15/2021   Family history of cancer    Invasive carcinoma of breast (HCC) 12/03/2020   Breast mass, left 07/01/2020   Situational mixed anxiety and depressive disorder 07/01/2020   Anxiety state 08/06/2014   Varicose veins of lower extremity 08/06/2014    PCP: Evelena Peat, PA  REFERRING PROVIDER: Evelena Peat, [place]  REFERRING DIAG: migraine without aura and without status migrainosus, not intractable   THERAPY DIAG:  Cervicalgia  Neuralgia and neuritis  Muscle weakness (generalized)  Nonintractable headache, unspecified chronicity pattern, unspecified headache type  Rationale for Evaluation and Treatment: Rehabilitation  ONSET DATE: neck pain chronic over many years, hand paresthesia onset with cancer treatment in 2022  PERTINENT HISTORY:  Patient is a 32 y.o. female who presents to outpatient physical therapy with a referral for medical diagnosis migraine without aura and without status migrainosus, not intractable. This patient's  chief complaints consist of chronic neck pain (R > L) and migriane headaches and bilateral hand/forearm paresthesia (R > L, C8/ulnar nerve distribution worse) leading to the following functional deficits: difficulty with activities that require use B UE, activities with fine motor control, sorting mail/paperwork, crochet, crafting,  working, activities with awkward or prolonged postures. 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  Patient denies hx of stroke, seizures, lung problems, heart problems, diabetes, unexplained weight loss, unexplained changes in bowel or bladder problems, osteoporosis, and spinal surgery.   SUBJECTIVE:                                                                                                                                                                                                         SUBJECTIVE STATEMENT: Patient states her neck is doing about the same as usual. She would like to do dry needling today. She took one of her rasatriptan today because she felt a headache coming on. That stopped the pain. She has been doing more at work recently.   PAIN:  NPRS: 3/10 at the posterior neck middle and base of scull  PATIENT GOALS: Learning how to manage symptoms or manage them better. Strength. To get better. To control it so it doesn't get any worse.   OBJECTIVE  TODAY'S TREATMENT:   Manual therapy: to reduce pain and tissue tension, improve range of motion, neuromodulation, in order to promote improved ability to complete functional activities.  PRONE with pillow under ankles - STM to bilateral suboccipital, cervical paraspinal, and upper trap regions   Modality: Dry needling performed to posterior cervical spine region to decrease pain and spasms along patient's neck and head region with patient in prone utilizing 2 dry needle(s) .30mm x 30mm with 3 sticks at each side in suboccipital  muscles and utilizing 2 dry needle(s) .25mm x 30mm with 2 sticks at each side in upper trap muscles. Patient educated about the risks and benefits from therapy and verbally consents to treatment. mild to moderate twitch response noted at both upper traps Dry needling performed by Luretha Murphy. Ilsa Iha PT, DPT who is certified in this technique.  Therapeutic exercise: to centralize symptoms and improve ROM, strength, muscular endurance, and activity tolerance required for successful completion of functional activities.  -  prone cervical spine retraction, 2x10 with 5 second hold.  - seated upper cervical spine flexion self stretch with fingers on chin facilitating cervical retraction and other hand in suboccipital region gentling pulling upwards and forwards. 3x30 seconds (pt reported increased paresthesia in R hand after first 2 reps when R hand was used in suboccipital region - advised to switch hands).  - Education on HEP including handout   Pt required multimodal cuing for proper technique and to facilitate improved neuromuscular control, strength, range of motion, and functional ability resulting in improved performance and form.  PATIENT EDUCATION:  Education details: Exercise purpose/form. Self management techniques. Education on diagnosis, prognosis, POC, anatomy and physiology of current condition Education on HEP including handout  Person educated: Patient Education method: Explanation, Demonstration, Tactile cues, Verbal cues, and Handouts Education comprehension: verbalized understanding, returned demonstration, and needs further education  HOME EXERCISE PROGRAM: Access Code: [email or web address] URL: [email or web address] Date: 04/15/2023 Prepared by: Norton Blizzard  Exercises - Supine Deep Neck Flexor Nods  - 1 x daily - 2 sets - 10 reps - 5-10 second  hold - Standing Bilateral Low Shoulder Row with Anchored Resistance  - 1 x daily - 3 sets - 10 reps - Sidelying Thoracic Rotation with  Open Book  - 1 x daily - 1-2 sets - 10 reps - 5 seconds/1 breath hold  HOME EXERCISE PROGRAM [[id number]] View at [email or web address]" using code: [id number] Sustained cervical flexion/retraction in sitting  -  Repeat 3 Repetitions, Hold 30 Seconds, Complete 1 Set, Times a Day  ASSESSMENT:  CLINICAL IMPRESSION: Patient arrives reporting no change in condition since last PT session. Today's session focused on manual therapy and dry needling to help decrease pain and and tension at posterior cervical spine musculature followed by postural strengthening and suboccipital muscle stretch to improve effectiveness of dry needling and continue building improved activity tolerance. Patient tolerated interventions well with deep ache noted with all dry needling and mild/moderate twitch response noted in bilateral upper traps. She has more tension and discomfort to palpation in left paraspinal muscles and upper traps compared to left. Would most likely benefit from dry needling to cervical multifidi next session. Patient would benefit from continued management of limiting condition by skilled physical therapist to address remaining impairments and functional limitations to work towards stated goals and return to PLOF or maximal functional independence.   From Initial PT Evaluation 04/13/2023:  Patient is a 32 y.o. female referred to outpatient physical therapy with a medical diagnosis of migraine without aura and without status migrainosus, not intractable who presents with signs and symptoms consistent with chronic neck pain and intermittent migraines bilateral hand and forearm paresthesia in glove pattern with worst symptoms at 5th digit. Patient's hand symptoms continue to seem best explained as chemotherapy-induced peripheral neuropathy which is often unable to be detected on nerve conduction testing despite being clinically present due to the fine nerves being affected, while nerve conduction studies are only able to  measure changes in large myelinated nerve fibers. Unable to make connection between neck and hand symptoms during exam. Will continue to be alert for signs of radiculopathy and address accordingly. Patient has potential for improved hand strength and control with physical therapy despite the presence of paresthesia. Upon exam, patient was very tender to palpation at suboccipital region which can be a source of cervicogenic contribution to headache or migraine trigger.  Patient presents with significant pain, ROM, joint stiffness, muscle tension, paresthesia, motor control, muscle performance (strength/power/endurance) and activity tolerance impairments that  are limiting ability to complete her usual activities such as those that require use B UE, activities with fine motor control, sorting mail/paperwork, crochet, crafting, working, activities with awkward or prolonged postures without difficulty. Patient will benefit from skilled physical therapy intervention to address current body structure impairments and activity limitations to improve function and work towards goals set in current POC in order to return to prior level of function or maximal functional improvement.   OBJECTIVE IMPAIRMENTS: decreased activity tolerance, decreased coordination, decreased endurance, decreased knowledge of condition, decreased ROM, decreased strength, impaired perceived functional ability, increased muscle spasms, impaired flexibility, impaired sensation, impaired UE functional use, and pain.   ACTIVITY LIMITATIONS: hygiene/grooming and   difficulty with activities that require use B UE, activities with fine motor control, sorting mail/paperwork, crochet, crafting, working, activities with awkward or prolonged postures  PARTICIPATION LIMITATIONS: meal prep, cleaning, interpersonal relationship, and occupation  PERSONAL FACTORS: Past/current experiences, Profession, Time since onset of injury/illness/exacerbation, and 3+  comorbidities:   Anxiety state; Breast mass, left; Situational mixed anxiety and depressive disorder; Varicose veins of lower extremity; Invasive carcinoma of breast (2022);  Malignant neoplasm of left breast in female, estrogen receptor positive (HCC); 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   REHAB POTENTIAL: Good  CLINICAL DECISION MAKING: Evolving/moderate complexity  EVALUATION COMPLEXITY: Moderate    GOALS: Goals reviewed with patient? No  SHORT TERM GOALS: Target date: 04/27/2023  Patient will be independent with initial home exercise program for self-management of symptoms. Baseline: Initial HEP provided at IE (04/13/23); Goal status: In-progress   LONG TERM GOALS: Target date: 07/06/2023  Patient will be independent with a long-term home exercise program for self-management of symptoms.  Baseline: Initial HEP provided at IE (04/13/23); Goal status: In-progress  2.  Patient will demonstrate improved FOTO to equal or greater than 69 by visit #10 to demonstrate improvement in overall condition and self-reported functional ability.  Baseline: 59 (04/13/23); Goal status: In-progress  3.  Patient will demonstrate cervical spine rotation equal or greater than 60 degrees bilaterally without increase in pain to improve her ability to check blind spot while driving.  Baseline: R/L 55/54 degrees (04/13/23); Goal status: In-progress  4.  Patient will demonstrate improved grip strength by 5lbs each in both UE to demonstrate improvement in B UE strength and ability to use hands for manual tasks such as working.  Baseline: to be measured visit 2 as appropriate (04/13/23); Goal status: In-progress  5.  Patient will complete community, work and/or recreational activities with 50% less limitation due to current condition.   Baseline: difficulty with activities that require use B UE, activities with fine motor control, sorting mail/paperwork, crochet, crafting, working, activities with awkward or prolonged postures (04/13/23); Goal status: In-progress   PLAN:  PT FREQUENCY: 1-2x/week  PT DURATION: 12 weeks  PLANNED INTERVENTIONS: Therapeutic exercises, Therapeutic activity, Neuromuscular re-education, Patient/Family education, Self Care, Joint mobilization, Dry Needling, Electrical stimulation, Spinal mobilization, Cryotherapy, Moist heat, Manual therapy, and Re-evaluation  PLAN FOR NEXT SESSION: progressive neck/posture/UE/functional strengthening, motor control, and ROM exercises as tolerated. Education. Manual therapy and dry needling as needed. Neurodynamics as appropriate.    Cira Rue, PT, DPT 04/19/2023, 5:50 PM  [facility] [address] [place], [place] [address] P: [phone] I F: [phone]

## 2023-04-22 ENCOUNTER — Ambulatory Visit: Payer: Commercial Managed Care - HMO | Admitting: Physical Therapy

## 2023-04-22 ENCOUNTER — Encounter: Payer: Self-pay | Admitting: Physical Therapy

## 2023-04-22 DIAGNOSIS — M542 Cervicalgia: Secondary | ICD-10-CM

## 2023-04-22 DIAGNOSIS — M6281 Muscle weakness (generalized): Secondary | ICD-10-CM

## 2023-04-22 DIAGNOSIS — M792 Neuralgia and neuritis, unspecified: Secondary | ICD-10-CM

## 2023-04-22 DIAGNOSIS — R519 Headache, unspecified: Secondary | ICD-10-CM

## 2023-04-22 NOTE — Therapy (Signed)
OUTPATIENT PHYSICAL THERAPY TRAITEMENT NOTE   Patient Name: Vickie Mathis MRN: [id number] DOB:[birth year], 32 y.o., female Today's Date: 04/22/2023  END OF SESSION:  PT End of Session - 04/22/23 1439     Visit Number 4    Number of Visits 13    Date for PT Re-Evaluation 07/06/23    Authorization Type [employer] reporting period from 04/13/2023    Progress Note Due on Visit 10    PT Start Time 1434    PT Stop Time 1512    PT Time Calculation (min) 38 min    Activity Tolerance Patient tolerated treatment well    Behavior During Therapy [facility] for tasks assessed/performed                Past Medical History:  Diagnosis Date   Anxiety    Asthma    Breast cancer (HCC) 11/2020   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 AXILLARY NODE DISSECTION;  Surgeon: Carolan Shiver, MD;  Location: [facility];  Service: General;  Laterality: Bilateral;  [place], 1.5 hours Cintron-Diaz 2.5 hours   BREAST BIOPSY Left 11/26/2020   vision 12:00 6cmfn [facility]   BREAST BIOPSY Left 11/26/2020   LN bx, hydro marker,  fragments of macrometastatic carcinoma   BREAST RECONSTRUCTION WITH PLACEMENT OF TISSUE EXPANDER AND FLEX HD (ACELLULAR HYDRATED DERMIS) Bilateral 06/25/2021   Procedure: BREAST RECONSTRUCTION WITH PLACEMENT OF TISSUE EXPANDER AND FLEX HD (ACELLULAR HYDRATED DERMIS);  Surgeon: Peggye Form, DO;  Location: [facility];  Service: Plastics;  Laterality: Bilateral;   PORTACATH PLACEMENT Right 12/13/2020   Procedure: INSERTION PORT-A-CATH;  Surgeon: Carolan Shiver, MD;  Location: [facility];  Service: General;  Laterality: Right;    REMOVAL OF BILATERAL TISSUE EXPANDERS WITH PLACEMENT OF BILATERAL BREAST IMPLANTS Bilateral 09/15/2021   Procedure: REMOVAL OF BILATERAL TISSUE EXPANDERS;  Surgeon: Peggye Form, DO;  Location: [facility];  Service: Plastics;  Laterality: Bilateral;   Patient Active Problem List   Diagnosis Date Noted   Chemotherapy-induced neuropathy (HCC) 02/06/2022   Right sided weakness 02/06/2022   Transaminitis 11/20/2021   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 10/25/2021   Malignant neoplasm of left breast in female, estrogen receptor positive (HCC) 06/25/2021   Genetic testing 01/15/2021   Family history of cancer    Invasive carcinoma of breast (HCC) 12/03/2020   Breast mass, left 07/01/2020   Situational mixed anxiety and depressive disorder 07/01/2020   Anxiety state 08/06/2014   Varicose veins of lower extremity 08/06/2014    PCP: Evelena Peat, PA  REFERRING PROVIDER: Evelena Peat, [place]  REFERRING DIAG: migraine without aura and without status migrainosus, not intractable   THERAPY DIAG:  Cervicalgia  Neuralgia and neuritis  Muscle weakness (generalized)  Nonintractable headache, unspecified chronicity pattern, unspecified headache type  Rationale for Evaluation and Treatment: Rehabilitation  ONSET DATE: neck pain chronic over many years, hand paresthesia onset with cancer treatment in 2022  PERTINENT HISTORY:  Patient is a 32 y.o. female who presents to outpatient physical therapy with a referral for medical diagnosis migraine without aura and without status migrainosus, not intractable. This  patient's chief complaints consist of chronic neck pain (R > L) and migriane headaches and bilateral hand/forearm paresthesia (R > L, C8/ulnar nerve distribution worse) leading to the following functional deficits: difficulty with activities that require use B UE, activities with fine motor control, sorting mail/paperwork, crochet, crafting,  working, activities with awkward or prolonged postures. 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  Patient denies hx of stroke, seizures, lung problems, heart problems, diabetes, unexplained weight loss, unexplained changes in bowel or bladder problems, osteoporosis, and spinal surgery.   SUBJECTIVE:                                                                                                                                                                                                         SUBJECTIVE STATEMENT: Patient reports she is feeling okay upon arrival. She states she worked today and she had a lot of arms above the head stuff today. She states she felt the dry needling helped a lot.   PAIN:  NPRS: 4/10 at the posterior neck middle and base of scull  PATIENT GOALS: Learning how to manage symptoms or manage them better. Strength. To get better. To control it so it doesn't get any worse.   OBJECTIVE  TODAY'S TREATMENT:   Therapeutic exercise: to centralize symptoms and improve ROM, strength, muscular endurance, and activity tolerance required for successful completion of functional activities.  - Upper body ergometer with level 5 resistance encourage joint nutrition, warm tissue, induce analgesic effect of aerobic exercise, improve muscular strength and endurance,  and prepare for remainder of session. 5 minutes reversing directions every 60 seconds .  Manual therapy: to reduce pain and tissue tension, improve range of motion, neuromodulation, in order to promote improved ability to  complete functional activities.  PRONE with pillow under ankles - STM to bilateral suboccipital, cervical paraspinal, and upper trap regions   Modality: Dry needling performed to posterior cervical spine region to decrease pain and spasms along patient's neck and head region with patient in prone utilizing 4 dry needle(s) .30mm x 30mm with 1 sticks at each side in suboccipital muscles,1 stick at right cervical spine multifidus at approx C4, 1 stick at left spleneus capitus/cervicis at approx C4, and two sticks at left upper trap and utilizing  1 dry needle(s) .25mm x 40mm with 2 sticks at left upper trap muscles. Patient educated about the risks and benefits from therapy and verbally consents to treatment. moderate to large twitch response noted at both upper traps, R UT tightented around first needle so that 2nd needle was inserted to help release muscle to get first needle out.  Dry needling performed by Luretha Murphy. Ilsa Iha PT, DPT who is certified in this technique.  Pt required multimodal cuing for proper technique and to facilitate improved neuromuscular control, strength, range of motion, and functional ability resulting in improved performance and form.  PATIENT EDUCATION:  Education details: Exercise purpose/form. Self management techniques. Education on diagnosis, prognosis, POC, anatomy and physiology of current condition Education on HEP including handout  Person educated: Patient Education method: Explanation, Demonstration, Tactile cues, Verbal cues, and Handouts Education comprehension: verbalized understanding, returned demonstration, and needs further education  HOME EXERCISE PROGRAM: Access Code: [email or web address] URL: [email or web address] Date: 04/15/2023 Prepared by: Norton Blizzard  Exercises - Supine Deep Neck Flexor Nods  - 1 x daily - 2 sets - 10 reps - 5-10 second  hold - Standing Bilateral Low Shoulder Row with Anchored Resistance  - 1 x daily - 3 sets - 10 reps -  Sidelying Thoracic Rotation with Open Book  - 1 x daily - 1-2 sets - 10 reps - 5 seconds/1 breath hold  HOME EXERCISE PROGRAM [[id number]] View at [email or web address]" using code: [email or web address] Sustained cervical flexion/retraction in sitting  -  Repeat 3 Repetitions, Hold 30 Seconds, Complete 1 Set, Times a Day  ASSESSMENT:  CLINICAL IMPRESSION: Patient arrives reporting improvement in symptoms since dry needling last PT session, so it was repeated with inclusion of right cervical spine paraspinals. Patient tolerated treatment well but right UT tightened around first needle needing increased time and 2nd needle to help muscle release to allow first needle to be removed. Patient was slightly late and needling took longer than expected, so not as much exercise was complete as originally planned. Patient would benefit from continued stretching and strengthening next session as tolerated. Patient would benefit from continued management of limiting condition by skilled physical therapist to address remaining impairments and functional limitations to work towards stated goals and return to PLOF or maximal functional independence.    From Initial PT Evaluation 04/13/2023:  Patient is a 32 y.o. female referred to outpatient physical therapy with a medical diagnosis of migraine without aura and without status migrainosus, not intractable who presents with signs and symptoms consistent with chronic neck pain and intermittent migraines bilateral hand and forearm paresthesia in glove pattern with worst symptoms at 5th digit. Patient's hand symptoms continue to seem best explained as chemotherapy-induced peripheral neuropathy which is often unable to be detected on nerve conduction testing despite being clinically present due to the fine nerves being affected, while nerve conduction studies are only able to measure changes in large myelinated nerve fibers. Unable to make connection between neck and hand symptoms during exam.  Will continue to be alert for signs of radiculopathy and address accordingly. Patient has potential for improved hand strength and control with physical therapy despite the presence of paresthesia. Upon exam, patient was very tender to palpation at suboccipital region which can be a source of cervicogenic contribution to headache or migraine trigger.  Patient presents with significant pain, ROM, joint stiffness, muscle tension, paresthesia, motor control, muscle performance (strength/power/endurance) and activity tolerance impairments that are limiting ability to complete her usual activities such as those that require use  B UE, activities with fine motor control, sorting mail/paperwork, crochet, crafting, working, activities with awkward or prolonged postures without difficulty. Patient will benefit from skilled physical therapy intervention to address current body structure impairments and activity limitations to improve function and work towards goals set in current POC in order to return to prior level of function or maximal functional improvement.   OBJECTIVE IMPAIRMENTS: decreased activity tolerance, decreased coordination, decreased endurance, decreased knowledge of condition, decreased ROM, decreased strength, impaired perceived functional ability, increased muscle spasms, impaired flexibility, impaired sensation, impaired UE functional use, and pain.   ACTIVITY LIMITATIONS: hygiene/grooming and   difficulty with activities that require use B UE, activities with fine motor control, sorting mail/paperwork, crochet, crafting, working, activities with awkward or prolonged postures  PARTICIPATION LIMITATIONS: meal prep, cleaning, interpersonal relationship, and occupation  PERSONAL FACTORS: Past/current experiences, Profession, Time since onset of injury/illness/exacerbation, and 3+ comorbidities:   Anxiety state; Breast mass, left; Situational mixed anxiety and depressive disorder; Varicose veins of  lower extremity; Invasive carcinoma of breast (2022);  Malignant neoplasm of left breast in female, estrogen receptor positive (HCC); 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   REHAB POTENTIAL: Good  CLINICAL DECISION MAKING: Evolving/moderate complexity  EVALUATION COMPLEXITY: Moderate    GOALS: Goals reviewed with patient? No  SHORT TERM GOALS: Target date: 04/27/2023  Patient will be independent with initial home exercise program for self-management of symptoms. Baseline: Initial HEP provided at IE (04/13/23); Goal status: In-progress   LONG TERM GOALS: Target date: 07/06/2023  Patient will be independent with a long-term home exercise program for self-management of symptoms.  Baseline: Initial HEP provided at IE (04/13/23); Goal status: In-progress  2.  Patient will demonstrate improved FOTO to equal or greater than 69 by visit #10 to demonstrate improvement in overall condition and self-reported functional ability.  Baseline: 59 (04/13/23); Goal status: In-progress  3.  Patient will demonstrate cervical spine rotation equal or greater than 60 degrees bilaterally without increase in pain to improve her ability to check blind spot while driving.  Baseline: R/L 55/54 degrees (04/13/23); Goal status: In-progress  4.  Patient will demonstrate improved grip strength by 5lbs each in both UE to demonstrate improvement in B UE strength and ability to use hands for manual tasks such as working.  Baseline: to be measured visit 2 as appropriate (04/13/23); Goal status: In-progress  5.  Patient will complete community, work and/or recreational activities with 50% less limitation due to current condition.  Baseline: difficulty with activities that require use B UE, activities with fine motor control, sorting mail/paperwork,  crochet, crafting, working, activities with awkward or prolonged postures (04/13/23); Goal status: In-progress   PLAN:  PT FREQUENCY: 1-2x/week  PT DURATION: 12 weeks  PLANNED INTERVENTIONS: Therapeutic exercises, Therapeutic activity, Neuromuscular re-education, Patient/Family education, Self Care, Joint mobilization, Dry Needling, Electrical stimulation, Spinal mobilization, Cryotherapy, Moist heat, Manual therapy, and Re-evaluation  PLAN FOR NEXT SESSION: progressive neck/posture/UE/functional strengthening, motor control, and ROM exercises as tolerated. Education. Manual therapy and dry needling as needed. Neurodynamics as appropriate.    Cira Rue, PT, DPT 04/22/2023, 3:21 PM  [facility] [address] [place], [place] [address] P: [phone] I F: [phone]

## 2023-04-23 ENCOUNTER — Other Ambulatory Visit: Payer: Commercial Managed Care - HMO

## 2023-04-23 ENCOUNTER — Inpatient Hospital Stay: Payer: Commercial Managed Care - HMO

## 2023-04-23 ENCOUNTER — Inpatient Hospital Stay: Payer: Commercial Managed Care - HMO | Attending: Oncology

## 2023-04-23 DIAGNOSIS — Z171 Estrogen receptor negative status [ER-]: Secondary | ICD-10-CM

## 2023-04-23 DIAGNOSIS — R7401 Elevation of levels of liver transaminase levels: Secondary | ICD-10-CM

## 2023-04-23 DIAGNOSIS — Z853 Personal history of malignant neoplasm of breast: Secondary | ICD-10-CM | POA: Insufficient documentation

## 2023-04-23 DIAGNOSIS — Z95828 Presence of other vascular implants and grafts: Secondary | ICD-10-CM

## 2023-04-23 LAB — CBC WITH DIFFERENTIAL/PLATELET
Abs Immature Granulocytes: 0.04 10*3/uL (ref 0.00–0.07)
Basophils Absolute: 0 10*3/uL (ref 0.0–0.1)
Basophils Relative: 1 %
Eosinophils Absolute: 0 10*3/uL (ref 0.0–0.5)
Eosinophils Relative: 0 %
HCT: 36.7 % (ref 36.0–46.0)
Hemoglobin: 12.6 g/dL (ref 12.0–15.0)
Immature Granulocytes: 1 %
Lymphocytes Relative: 10 %
Lymphs Abs: 0.9 10*3/uL (ref 0.7–4.0)
MCH: 32.1 pg (ref 26.0–34.0)
MCHC: 34.3 g/dL (ref 30.0–36.0)
MCV: 93.4 fL (ref 80.0–100.0)
Monocytes Absolute: 0.4 10*3/uL (ref 0.1–1.0)
Monocytes Relative: 5 %
Neutro Abs: 7.1 10*3/uL (ref 1.7–7.7)
Neutrophils Relative %: 83 %
Platelets: 193 10*3/uL (ref 150–400)
RBC: 3.93 MIL/uL (ref 3.87–5.11)
RDW: 12.6 % (ref 11.5–15.5)
WBC: 8.5 10*3/uL (ref 4.0–10.5)
nRBC: 0 % (ref 0.0–0.2)

## 2023-04-23 LAB — COMPREHENSIVE METABOLIC PANEL
ALT: 36 U/L (ref 0–44)
AST: 34 U/L (ref 15–41)
Albumin: 4.3 g/dL (ref 3.5–5.0)
Alkaline Phosphatase: 79 U/L (ref 38–126)
Anion gap: 7 (ref 5–15)
BUN: 12 mg/dL (ref 6–20)
CO2: 23 mmol/L (ref 22–32)
Calcium: 8.9 mg/dL (ref 8.9–10.3)
Chloride: 105 mmol/L (ref 98–111)
Creatinine, Ser: 0.74 mg/dL (ref 0.44–1.00)
GFR, Estimated: >60 mL/min (ref >60)
Glucose, Bld: 124 mg/dL — ABNORMAL HIGH (ref 70–99)
Potassium: 4 mmol/L (ref 3.5–5.1)
Sodium: 135 mmol/L (ref 135–145)
Total Bilirubin: 0.3 mg/dL (ref 0.3–1.2)
Total Protein: 7.2 g/dL (ref 6.5–8.1)

## 2023-04-23 MED ORDER — HEPARIN SOD (PORK) LOCK FLUSH 100 UNIT/ML IV SOLN
500.0000 [IU] | Freq: Once | INTRAVENOUS | Status: AC
  Administered 2023-04-23: 500 [IU] via INTRAVENOUS
  Filled 2023-04-23: qty 5

## 2023-04-23 MED ORDER — SODIUM CHLORIDE 0.9% FLUSH
10.0000 mL | Freq: Once | INTRAVENOUS | Status: AC
  Administered 2023-04-23: 10 mL via INTRAVENOUS
  Filled 2023-04-23: qty 10

## 2023-04-26 ENCOUNTER — Encounter: Payer: Self-pay | Admitting: Physical Therapy

## 2023-04-26 ENCOUNTER — Ambulatory Visit: Payer: Commercial Managed Care - HMO | Admitting: Physical Therapy

## 2023-04-26 DIAGNOSIS — M542 Cervicalgia: Secondary | ICD-10-CM

## 2023-04-26 DIAGNOSIS — M6281 Muscle weakness (generalized): Secondary | ICD-10-CM

## 2023-04-26 DIAGNOSIS — R519 Headache, unspecified: Secondary | ICD-10-CM

## 2023-04-26 DIAGNOSIS — M792 Neuralgia and neuritis, unspecified: Secondary | ICD-10-CM

## 2023-04-26 NOTE — Therapy (Signed)
OUTPATIENT PHYSICAL THERAPY TRAITEMENT NOTE   Patient Name: Vickie Mathis MRN: [id number] DOB:1990/12/14, 32 y.o., female Today's Date: 04/26/2023  END OF SESSION:  PT End of Session - 04/26/23 1647     Visit Number 5    Number of Visits 13    Date for PT Re-Evaluation 07/06/23    Authorization Type [employer] reporting period from 04/13/2023    Progress Note Due on Visit 10    PT Start Time 1643    PT Stop Time 1723    PT Time Calculation (min) 40 min    Activity Tolerance Patient tolerated treatment well    Behavior During Therapy [facility] for tasks assessed/performed              Past Medical History:  Diagnosis Date   Anxiety    Asthma    Breast cancer (HCC) 11/2020   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 AXILLARY NODE DISSECTION;  Surgeon: Carolan Shiver, MD;  Location: [facility];  Service: General;  Laterality: Bilateral;  [place], 1.5 hours C[place]Diaz 2.5 hours   BREAST BIOPSY Left 11/26/2020   vision 12:00 6cmfn [facility]   BREAST BIOPSY Left 11/26/2020   LN bx, hydro marker,  fragments of macrometastatic carcinoma   BREAST RECONSTRUCTION WITH PLACEMENT OF TISSUE EXPANDER AND FLEX HD (ACELLULAR HYDRATED DERMIS) Bilateral 06/25/2021   Procedure: BREAST RECONSTRUCTION WITH PLACEMENT OF TISSUE EXPANDER AND FLEX HD (ACELLULAR HYDRATED DERMIS);  Surgeon: Peggye Form, DO;  Location: [facility];  Service: Plastics;  Laterality: Bilateral;   PORTACATH PLACEMENT Right 12/13/2020   Procedure: INSERTION PORT-A-CATH;  Surgeon: Carolan Shiver, MD;  Location: [facility];  Service: General;  Laterality: Right;    REMOVAL OF BILATERAL TISSUE EXPANDERS WITH PLACEMENT OF BILATERAL BREAST IMPLANTS Bilateral 09/15/2021   Procedure: REMOVAL OF BILATERAL TISSUE EXPANDERS;  Surgeon: Peggye Form, DO;  Location: [facility];  Service: Plastics;  Laterality: Bilateral;   Patient Active Problem List   Diagnosis Date Noted   Chemotherapy-induced neuropathy (HCC) 02/06/2022   Right sided weakness 02/06/2022   Transaminitis 11/20/2021   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 10/25/2021   Malignant neoplasm of left breast in female, estrogen receptor positive (HCC) 06/25/2021   Genetic testing 01/15/2021   Family history of cancer    Invasive carcinoma of breast (HCC) 12/03/2020   Breast mass, left 07/01/2020   Situational mixed anxiety and depressive disorder 07/01/2020   Anxiety state 08/06/2014   Varicose veins of lower extremity 08/06/2014    PCP: Evelena Peat, PA  REFERRING PROVIDER: Evelena Peat, [place]  REFERRING DIAG: migraine without aura and without status migrainosus, not intractable   THERAPY DIAG:  Cervicalgia  Neuralgia and neuritis  Muscle weakness (generalized)  Nonintractable headache, unspecified chronicity pattern, unspecified headache type  Rationale for Evaluation and Treatment: Rehabilitation  ONSET DATE: neck pain chronic over many years, hand paresthesia onset with cancer treatment in 2022  PERTINENT HISTORY:  Patient is a 32 y.o. female who presents to outpatient physical therapy with a referral for medical diagnosis migraine without aura and without status migrainosus, not intractable. This patient's chief  complaints consist of chronic neck pain (R > L) and migriane headaches and bilateral hand/forearm paresthesia (R > L, C8/ulnar nerve distribution worse) leading to the following functional deficits: difficulty with activities that require use B UE, activities with fine motor control, sorting mail/paperwork, crochet, crafting,  working, activities with awkward or prolonged postures. 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  Patient denies hx of stroke, seizures, lung problems, heart problems, diabetes, unexplained weight loss, unexplained changes in bowel or bladder problems, osteoporosis, and spinal surgery.   SUBJECTIVE:                                                                                                                                                                                                         SUBJECTIVE STATEMENT: Patient her neck and back are hurting. She states she slept wrong and awoke with some pain in the right side of her neck. She states she did a lot today so her neck and back is hurting. Patient states she felt pretty good for a while after her last PT session. She was a little achy the next day but  not bad. She does really like the dry needling. HEP is going well.   PAIN:  NPRS: 4/10 at the posterior neck, base of scull, and over B UT; 4-5/10 over right low and middle.   PATIENT GOALS: Learning how to manage symptoms or manage them better. Strength. To get better. To control it so it doesn't get any worse.   OBJECTIVE  TODAY'S TREATMENT:   Therapeutic exercise: to centralize symptoms and improve ROM, strength, muscular endurance, and activity tolerance required for successful completion of functional activities.  - Upper body ergometer with level 5 resistance encourage joint nutrition, warm tissue, induce analgesic effect of aerobic exercise, improve muscular strength and endurance, and  prepare for remainder of session. 5 minutes reversing directions every 60 seconds.  (Manual therapy / dry needling - see below).  - prone scapular retraction with B shoulder extension, palms down, AROM 1x10, 5 second hold.  - prone scapular retraction with B shoulder horizontal abduction "T", AROM, 1x10 - prone scapular retraction with bilateral scaption "Y", AROM, 1x10 - prone press up reps to patient preference to relieve tension in upper back between exercises.  Manual therapy: to reduce pain and tissue tension, improve range of motion, neuromodulation, in order to promote improved ability to complete functional activities.  PRONE with pillow under ankles - STM to bilateral suboccipital, cervical paraspinal, and upper trap regions   Modality: Dry needling performed to posterior cervical spine region to decrease pain and spasms along patient's neck and head region with patient in prone utilizing 2 dry needle(s) .30mm x 30mm with 1 sticks at each side in suboccipital muscles,1 stick at each cervical spine multifidus at approx C4,  and utilizing 2 dry needle(s) .25mm x 40mm with 2 sticks at each upper trap. Patient educated about the risks and benefits from therapy and verbally consents to treatment. moderate to large twitch response noted at both upper traps. Dry needling performed by Luretha Murphy. Ilsa Iha PT, DPT who is certified in this technique.  Pt required multimodal cuing for proper technique and to facilitate improved neuromuscular control, strength, range of motion, and functional ability resulting in improved performance and form.  PATIENT EDUCATION:  Education details: Exercise purpose/form. Self management techniques. Education on diagnosis, prognosis, POC, anatomy and physiology of current condition Education on HEP including handout  Person educated: Patient Education method: Explanation, Demonstration, Tactile cues, Verbal cues, and Handouts Education comprehension: verbalized  understanding, returned demonstration, and needs further education  HOME EXERCISE PROGRAM: Access Code: [email or web address] URL: [email or web address] Date: 04/15/2023 Prepared by: Norton Blizzard  Exercises - Supine Deep Neck Flexor Nods  - 1 x daily - 2 sets - 10 reps - 5-10 second  hold - Standing Bilateral Low Shoulder Row with Anchored Resistance  - 1 x daily - 3 sets - 10 reps - Sidelying Thoracic Rotation with Open Book  - 1 x daily - 1-2 sets - 10 reps - 5 seconds/1 breath hold  HOME EXERCISE PROGRAM [[id number]] View at [email or web address]" using code: [email or web address] Sustained cervical flexion/retraction in sitting  -  Repeat 3 Repetitions, Hold 30 Seconds, Complete 1 Set, Times a Day  ASSESSMENT:  CLINICAL IMPRESSION: Patient arrives reporting good response to dry needling again. Continued with dry needling with manual therapy this session and postural/scapular strengthening. Patient tolerated session well with some increased tension in her upper back at times that improved with prone press up. Patient found scapular exercises challenging. Patient would benefit from continued management of limiting condition by skilled physical therapist to address remaining impairments and functional limitations to work towards stated goals and return to PLOF or maximal functional independence.     From Initial PT Evaluation 04/13/2023:  Patient is a 32 y.o. female referred to outpatient physical therapy with a medical diagnosis of migraine without aura and without status migrainosus, not intractable who presents with signs and symptoms consistent with chronic neck pain and intermittent migraines bilateral hand and forearm paresthesia in glove pattern with worst symptoms at 5th digit. Patient's hand symptoms continue to seem best explained as chemotherapy-induced peripheral neuropathy which is often unable to be detected on nerve conduction testing despite being clinically present due to the fine nerves being  affected, while nerve conduction studies are only able to measure changes in large myelinated nerve fibers. Unable to make connection between neck and hand symptoms during exam. Will continue to be alert for signs of radiculopathy and address accordingly. Patient has potential for improved hand strength and control with physical therapy despite the presence of paresthesia. Upon exam, patient was very tender to palpation at suboccipital region which can be a source of cervicogenic contribution to headache or migraine trigger.  Patient presents with significant pain, ROM, joint stiffness, muscle tension, paresthesia, motor control, muscle performance (strength/power/endurance) and activity tolerance impairments that are limiting ability to complete her usual activities such as those that require use B UE, activities with fine motor control, sorting mail/paperwork, crochet, crafting, working, activities with awkward or prolonged postures without difficulty. Patient will benefit from skilled physical therapy intervention to address current body structure impairments and activity limitations to improve function and work towards goals set in current POC in order to return to prior level of function or maximal functional improvement.   OBJECTIVE IMPAIRMENTS: decreased activity tolerance, decreased coordination, decreased endurance, decreased knowledge of condition, decreased ROM, decreased strength, impaired perceived functional ability, increased muscle spasms, impaired flexibility, impaired sensation, impaired UE functional use, and pain.   ACTIVITY LIMITATIONS: hygiene/grooming and   difficulty with activities that require use B UE, activities with fine motor control, sorting mail/paperwork, crochet, crafting, working, activities with awkward or prolonged postures  PARTICIPATION LIMITATIONS: meal prep, cleaning, interpersonal relationship, and occupation  PERSONAL FACTORS: Past/current experiences, Profession,  Time since onset of injury/illness/exacerbation, and 3+ comorbidities:   Anxiety state; Breast mass, left; Situational mixed anxiety and depressive disorder; Varicose veins of lower extremity; Invasive carcinoma of breast (2022);  Malignant neoplasm of left breast in female, estrogen receptor positive (HCC); 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   REHAB POTENTIAL: Good  CLINICAL DECISION MAKING: Evolving/moderate complexity  EVALUATION COMPLEXITY: Moderate    GOALS: Goals reviewed with patient? No  SHORT TERM GOALS: Target date: 04/27/2023  Patient will be independent with initial home exercise program for self-management of symptoms. Baseline: Initial HEP provided at IE (04/13/23); Goal status: In-progress   LONG TERM GOALS: Target date: 07/06/2023  Patient will be independent with a long-term home exercise program for self-management of symptoms.  Baseline: Initial HEP provided at IE (04/13/23); Goal status: In-progress  2.  Patient will demonstrate improved FOTO to equal or greater than 69 by visit #10 to demonstrate improvement in overall condition and self-reported functional ability.  Baseline: 59 (04/13/23); Goal status: In-progress  3.  Patient will demonstrate cervical spine rotation equal or greater than 60 degrees bilaterally without increase in pain to improve her ability to check blind spot while driving.  Baseline: R/L 55/54 degrees (04/13/23); Goal status: In-progress  4.  Patient will demonstrate improved grip strength by 5lbs each in both UE to demonstrate improvement in B UE strength and ability to use hands for manual tasks such as working.  Baseline: to be measured visit 2 as appropriate (04/13/23); Goal status: In-progress  5.  Patient will complete community, work and/or recreational activities  with 50% less limitation due to current condition.  Baseline: difficulty with activities that require use B UE, activities with fine motor control, sorting mail/paperwork, crochet, crafting, working, activities with awkward or prolonged postures (04/13/23); Goal status: In-progress   PLAN:  PT FREQUENCY: 1-2x/week  PT DURATION: 12 weeks  PLANNED INTERVENTIONS: Therapeutic exercises, Therapeutic activity, Neuromuscular re-education, Patient/Family education, Self Care, Joint mobilization, Dry Needling, Electrical stimulation, Spinal mobilization, Cryotherapy, Moist heat, Manual therapy, and Re-evaluation  PLAN FOR NEXT SESSION: progressive neck/posture/UE/functional strengthening, motor control, and ROM exercises as tolerated. Education. Manual therapy and dry needling as needed. Neurodynamics as appropriate.    Cira Rue, PT, DPT 04/26/2023, 5:27 PM  [facility] [address] [place], [place] [address] P: [phone]  I F: [phone]

## 2023-04-29 ENCOUNTER — Other Ambulatory Visit: Payer: Self-pay | Admitting: *Deleted

## 2023-04-29 ENCOUNTER — Telehealth: Payer: Self-pay | Admitting: Oncology

## 2023-04-29 ENCOUNTER — Ambulatory Visit: Payer: Commercial Managed Care - HMO | Admitting: Physical Therapy

## 2023-04-29 ENCOUNTER — Encounter: Payer: Self-pay | Admitting: Physical Therapy

## 2023-04-29 DIAGNOSIS — R519 Headache, unspecified: Secondary | ICD-10-CM

## 2023-04-29 DIAGNOSIS — M792 Neuralgia and neuritis, unspecified: Secondary | ICD-10-CM

## 2023-04-29 DIAGNOSIS — C50412 Malignant neoplasm of upper-outer quadrant of left female breast: Secondary | ICD-10-CM

## 2023-04-29 DIAGNOSIS — M6281 Muscle weakness (generalized): Secondary | ICD-10-CM

## 2023-04-29 DIAGNOSIS — R7401 Elevation of levels of liver transaminase levels: Secondary | ICD-10-CM

## 2023-04-29 DIAGNOSIS — M542 Cervicalgia: Secondary | ICD-10-CM | POA: Diagnosis not present

## 2023-04-29 NOTE — Telephone Encounter (Signed)
Patient called and left a message wanting to schedule the labs her Dr. At [facility] ordered for her to get here. I do not see the labs ordered but she said the Dr. Rhina Brackett over an order for port flushes and labs. Please advise.

## 2023-04-29 NOTE — Telephone Encounter (Signed)
Vickie Mathis- can you see what labs she wants and get them scheduled?

## 2023-04-29 NOTE — Therapy (Signed)
OUTPATIENT PHYSICAL THERAPY TRAITEMENT NOTE   Patient Name: Vickie Mathis MRN: [id number] DOB:October 27, 1990, 32 y.o., female Today's Date: 04/29/2023  END OF SESSION:  PT End of Session - 04/29/23 1551     Visit Number 6    Number of Visits 13    Date for PT Re-Evaluation 07/06/23    Authorization Type [employer] reporting period from 04/13/2023    Progress Note Due on Visit 10    PT Start Time 1522   patient late   PT Stop Time 1600    PT Time Calculation (min) 38 min    Activity Tolerance Patient tolerated treatment well    Behavior During Therapy [facility] for tasks assessed/performed               Past Medical History:  Diagnosis Date   Anxiety    Asthma    Breast cancer (HCC) 11/2020   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 AXILLARY NODE DISSECTION;  Surgeon: Carolan Shiver, MD;  Location: [facility];  Service: General;  Laterality: Bilateral;  [place], 1.5 hours [place]Diaz 2.5 hours   BREAST BIOPSY Left 11/26/2020   vision 12:00 6cmfn [facility]   BREAST BIOPSY Left 11/26/2020   LN bx, hydro marker,  fragments of macrometastatic carcinoma   BREAST RECONSTRUCTION WITH PLACEMENT OF TISSUE EXPANDER AND FLEX HD (ACELLULAR HYDRATED DERMIS) Bilateral 06/25/2021   Procedure: BREAST RECONSTRUCTION WITH PLACEMENT OF TISSUE EXPANDER AND FLEX HD (ACELLULAR HYDRATED DERMIS);  Surgeon: Peggye Form, DO;  Location: [facility];  Service: Plastics;  Laterality: Bilateral;   PORTACATH PLACEMENT Right 12/13/2020   Procedure: INSERTION PORT-A-CATH;  Surgeon: Carolan Shiver, MD;  Location: [facility];  Service: General;   Laterality: Right;   REMOVAL OF BILATERAL TISSUE EXPANDERS WITH PLACEMENT OF BILATERAL BREAST IMPLANTS Bilateral 09/15/2021   Procedure: REMOVAL OF BILATERAL TISSUE EXPANDERS;  Surgeon: Peggye Form, DO;  Location: [facility];  Service: Plastics;  Laterality: Bilateral;   Patient Active Problem List   Diagnosis Date Noted   Chemotherapy-induced neuropathy (HCC) 02/06/2022   Right sided weakness 02/06/2022   Transaminitis 11/20/2021   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 10/25/2021   Malignant neoplasm of left breast in female, estrogen receptor positive (HCC) 06/25/2021   Genetic testing 01/15/2021   Family history of cancer    Invasive carcinoma of breast (HCC) 12/03/2020   Breast mass, left 07/01/2020   Situational mixed anxiety and depressive disorder 07/01/2020   Anxiety state 08/06/2014   Varicose veins of lower extremity 08/06/2014    PCP: Evelena Peat, PA  REFERRING PROVIDER: Evelena Peat, [place]  REFERRING DIAG: migraine without aura and without status migrainosus, not intractable   THERAPY DIAG:  Cervicalgia  Neuralgia and neuritis  Muscle weakness (generalized)  Nonintractable headache, unspecified chronicity pattern, unspecified headache type  Rationale for Evaluation and Treatment: Rehabilitation  ONSET DATE: neck pain chronic over many years, hand paresthesia onset with cancer treatment in 2022  PERTINENT HISTORY:  Patient is a 32 y.o. female who presents to outpatient physical therapy with a referral for medical diagnosis migraine without aura and without status migrainosus, not  intractable. This patient's chief complaints consist of chronic neck pain (R > L) and migriane headaches and bilateral hand/forearm paresthesia (R > L, C8/ulnar nerve distribution worse) leading to the following functional deficits: difficulty with activities that require use B UE, activities with fine motor control, sorting mail/paperwork,  crochet, crafting, working, activities with awkward or prolonged postures. 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  Patient denies hx of stroke, seizures, lung problems, heart problems, diabetes, unexplained weight loss, unexplained changes in bowel or bladder problems, osteoporosis, and spinal surgery.   SUBJECTIVE:                                                                                                                                                                                                         SUBJECTIVE STATEMENT: Patient states her triceps have been hurting a lot since last PT session and she also has a lot of pain between her scapulae.   PAIN:  NPRS: 4-5/10 at rest over triceps but it is higher when she uses them. She also has pain between her scapulae at the posterior neck.   PATIENT GOALS: Learning how to manage symptoms or manage them better. Strength. To get better. To control it so it doesn't get any worse.   OBJECTIVE  TODAY'S TREATMENT:   Therapeutic exercise: to centralize symptoms and improve ROM, strength, muscular endurance, and activity tolerance required for successful completion of functional activities.  - Upper body ergometer with level 5 resistance encourage joint nutrition, warm tissue, induce analgesic effect of aerobic exercise, improve muscular strength and endurance, and prepare for remainder of session. 5 minutes reversing directions every 60 seconds.  (Manual therapy / dry needling - see below).  - prone scapular retraction with B shoulder extension,  palms down, AROM 1x10, 5 second hold.  - prone scapular retraction with B shoulder horizontal abduction "T", AROM, 1x10 - prone scapular retraction with bilateral scaption "Y", AROM, 1x10  Manual therapy: to reduce pain and tissue tension, improve range of motion, neuromodulation, in order to promote improved ability to complete functional activities.  PRONE with pillow under ankles - STM to bilateral suboccipital, cervical paraspinal, and upper trap regions   Modality: Dry needling performed to posterior cervical spine region to decrease pain and spasms along  patient's neck and head region with patient in prone utilizing 1 dry needle(s) .30mm x 30mm with 1 sticks at each side in suboccipital muscles,1 stick at each cervical spine multifidus at approx C4, and 1 stick at right  spleneus capitus/cervicis and utilizing 1 dry needle(s) .25mm x 40mm with 2-3 sticks at each upper trap. Patient educated about the risks and benefits from therapy and verbally consents to treatment. moderate to large twitch response noted at both upper traps. Dry needling performed by Luretha Murphy. Ilsa Iha PT, DPT who is certified in this technique.  Pt required multimodal cuing for proper technique and to facilitate improved neuromuscular control, strength, range of motion, and functional ability resulting in improved performance and form.  PATIENT EDUCATION:  Education details: Exercise purpose/form. Self management techniques. Education on diagnosis, prognosis, POC, anatomy and physiology of current condition Education on HEP including handout  Person educated: Patient Education method: Explanation, Demonstration, Tactile cues, Verbal cues, and Handouts Education comprehension: verbalized understanding, returned demonstration, and needs further education  HOME EXERCISE PROGRAM: Access Code: [email or web address] URL: [email or web address] Date: 04/29/2023 Prepared by: Norton Blizzard  Exercises - Supine Deep Neck Flexor  Nods  - 1 x daily - 2 sets - 10 reps - 5-10 second  hold - Standing Bilateral Low Shoulder Row with Anchored Resistance  - 1 x daily - 3 sets - 10 reps - Sidelying Thoracic Rotation with Open Book  - 1 x daily - 1-2 sets - 10 reps - 5 seconds/1 breath hold - Prone W Scapular Retraction  - 3-5 x weekly - 1-2 sets - 10 reps - 5 seconds hold - Prone Scapular Retraction Arms at Side  - 3-5 x weekly - 1-3 sets - 10 reps - Prone Scapular Retraction Y  - 3-5 x weekly - 1-3 sets - 10 reps  HOME EXERCISE PROGRAM [[id number]] View at [email or web address]" using code: [id number] Sustained cervical flexion/retraction in sitting  -  Repeat 3 Repetitions, Hold 30 Seconds, Complete 1 Set, Times a Day  ASSESSMENT:  CLINICAL IMPRESSION: Patient arrives reporting increased pain that appears to be DOMS in the bilateral triceps, rhomboids, and middle trap regions. Patient continued with dry needling to address pain in various parts of her neck. Continues to get moderate to strong twitch response at bilateral upper traps. Postural strengthening was continued with slight modifications to lessen triceps discomfort and patient was encouraged to continue with exercise to improve her strength. She was educated that DOMS does not continue to happen if she is able to continue working the same muscle groups. Plan to continue with strengthening and pain control activities next session. Patient would benefit from continued management of limiting condition by skilled physical therapist to address remaining impairments and functional limitations to work towards stated goals and return to PLOF or maximal functional independence.   From Initial PT Evaluation 04/13/2023:  Patient is a 32 y.o. female referred to outpatient physical therapy with a medical diagnosis of migraine without aura and without status migrainosus, not intractable who presents with signs and symptoms consistent with chronic neck pain and intermittent migraines bilateral  hand and forearm paresthesia in glove pattern with worst symptoms at 5th digit. Patient's hand symptoms continue to seem best explained as chemotherapy-induced peripheral neuropathy which is often unable to be detected on nerve conduction testing despite being clinically present due to the fine nerves being affected, while nerve conduction studies are only able to measure changes in large myelinated nerve fibers. Unable to make connection between neck and hand symptoms  during exam. Will continue to be alert for signs of radiculopathy and address accordingly. Patient has potential for improved hand strength and control with physical therapy despite the presence of paresthesia. Upon exam, patient was very tender to palpation at suboccipital region which can be a source of cervicogenic contribution to headache or migraine trigger.  Patient presents with significant pain, ROM, joint stiffness, muscle tension, paresthesia, motor control, muscle performance (strength/power/endurance) and activity tolerance impairments that are limiting ability to complete her usual activities such as those that require use B UE, activities with fine motor control, sorting mail/paperwork, crochet, crafting, working, activities with awkward or prolonged postures without difficulty. Patient will benefit from skilled physical therapy intervention to address current body structure impairments and activity limitations to improve function and work towards goals set in current POC in order to return to prior level of function or maximal functional improvement.   OBJECTIVE IMPAIRMENTS: decreased activity tolerance, decreased coordination, decreased endurance, decreased knowledge of condition, decreased ROM, decreased strength, impaired perceived functional ability, increased muscle spasms, impaired flexibility, impaired sensation, impaired UE functional use, and pain.   ACTIVITY LIMITATIONS: hygiene/grooming and   difficulty with activities  that require use B UE, activities with fine motor control, sorting mail/paperwork, crochet, crafting, working, activities with awkward or prolonged postures  PARTICIPATION LIMITATIONS: meal prep, cleaning, interpersonal relationship, and occupation  PERSONAL FACTORS: Past/current experiences, Profession, Time since onset of injury/illness/exacerbation, and 3+ comorbidities:   Anxiety state; Breast mass, left; Situational mixed anxiety and depressive disorder; Varicose veins of lower extremity; Invasive carcinoma of breast (2022);  Malignant neoplasm of left breast in female, estrogen receptor positive (HCC); 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   REHAB POTENTIAL: Good  CLINICAL DECISION MAKING: Evolving/moderate complexity  EVALUATION COMPLEXITY: Moderate    GOALS: Goals reviewed with patient? No  SHORT TERM GOALS: Target date: 04/27/2023  Patient will be independent with initial home exercise program for self-management of symptoms. Baseline: Initial HEP provided at IE (04/13/23); Goal status: In-progress   LONG TERM GOALS: Target date: 07/06/2023  Patient will be independent with a long-term home exercise program for self-management of symptoms.  Baseline: Initial HEP provided at IE (04/13/23); Goal status: In-progress  2.  Patient will demonstrate improved FOTO to equal or greater than 69 by visit #10 to demonstrate improvement in overall condition and self-reported functional ability.  Baseline: 59 (04/13/23); Goal status: In-progress  3.  Patient will demonstrate cervical spine rotation equal or greater than 60 degrees bilaterally without increase in pain to improve her ability to check blind spot while driving.  Baseline: R/L 55/54 degrees (04/13/23); Goal status: In-progress  4.  Patient will demonstrate  improved grip strength by 5lbs each in both UE to demonstrate improvement in B UE strength and ability to use hands for manual tasks such as working.  Baseline: to be measured visit 2 as appropriate (04/13/23); Goal status: In-progress  5.  Patient will complete community, work and/or recreational activities with 50% less limitation due to current condition.  Baseline: difficulty with activities that require use B UE, activities with fine motor control, sorting mail/paperwork, crochet, crafting, working, activities with awkward or prolonged postures (04/13/23); Goal status: In-progress   PLAN:  PT FREQUENCY: 1-2x/week  PT DURATION: 12 weeks  PLANNED INTERVENTIONS: Therapeutic exercises, Therapeutic activity, Neuromuscular re-education, Patient/Family education, Self Care, Joint mobilization, Dry Needling, Electrical stimulation, Spinal mobilization, Cryotherapy, Moist heat, Manual therapy,  and Re-evaluation  PLAN FOR NEXT SESSION: progressive neck/posture/UE/functional strengthening, motor control, and ROM exercises as tolerated. Education. Manual therapy and dry needling as needed. Neurodynamics as appropriate.    Cira Rue, PT, DPT 04/29/2023, 7:23 PM  [facility] [address] [place], [place] [address] P: [phone] I F: [phone]

## 2023-05-03 ENCOUNTER — Encounter: Payer: Self-pay | Admitting: *Deleted

## 2023-05-04 ENCOUNTER — Encounter: Payer: Self-pay | Admitting: Physical Therapy

## 2023-05-04 ENCOUNTER — Ambulatory Visit: Payer: Commercial Managed Care - HMO | Admitting: Physical Therapy

## 2023-05-04 DIAGNOSIS — M542 Cervicalgia: Secondary | ICD-10-CM | POA: Diagnosis not present

## 2023-05-04 DIAGNOSIS — R519 Headache, unspecified: Secondary | ICD-10-CM

## 2023-05-04 DIAGNOSIS — M792 Neuralgia and neuritis, unspecified: Secondary | ICD-10-CM

## 2023-05-04 DIAGNOSIS — M6281 Muscle weakness (generalized): Secondary | ICD-10-CM

## 2023-05-04 NOTE — Therapy (Signed)
OUTPATIENT PHYSICAL THERAPY TREATMENT NOTE   Patient Name: Vickie Mathis MRN: [id number] DOB:Sep 17, 1991, 32 y.o., female Today's Date: 05/04/2023  END OF SESSION:  PT End of Session - 05/04/23 1438     Visit Number 7    Number of Visits 13    Date for PT Re-Evaluation 07/06/23    Authorization Type [employer] reporting period from 04/13/2023    Progress Note Due on Visit 10    PT Start Time 1437    PT Stop Time 1515    PT Time Calculation (min) 38 min    Activity Tolerance Patient tolerated treatment well    Behavior During Therapy [facility] for tasks assessed/performed             Past Medical History:  Diagnosis Date   Anxiety    Asthma    Breast cancer (HCC) 11/2020   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 AXILLARY NODE DISSECTION;  Surgeon: Carolan Shiver, MD;  Location: [facility];  Service: General;  Laterality: Bilateral;  [place], 1.5 hours [place]Diaz 2.5 hours   BREAST BIOPSY Left 11/26/2020   vision 12:00 6cmfn [facility]   BREAST BIOPSY Left 11/26/2020   LN bx, hydro marker,  fragments of macrometastatic carcinoma   BREAST RECONSTRUCTION WITH PLACEMENT OF TISSUE EXPANDER AND FLEX HD (ACELLULAR HYDRATED DERMIS) Bilateral 06/25/2021   Procedure: BREAST RECONSTRUCTION WITH PLACEMENT OF TISSUE EXPANDER AND FLEX HD (ACELLULAR HYDRATED DERMIS);  Surgeon: Peggye Form, DO;  Location: [facility];  Service: Plastics;  Laterality: Bilateral;   PORTACATH PLACEMENT Right 12/13/2020   Procedure: INSERTION PORT-A-CATH;  Surgeon: Carolan Shiver, MD;  Location: [facility];  Service: General;  Laterality: Right;    REMOVAL OF BILATERAL TISSUE EXPANDERS WITH PLACEMENT OF BILATERAL BREAST IMPLANTS Bilateral 09/15/2021   Procedure: REMOVAL OF BILATERAL TISSUE EXPANDERS;  Surgeon: Peggye Form, DO;  Location: [facility];  Service: Plastics;  Laterality: Bilateral;   Patient Active Problem List   Diagnosis Date Noted   Chemotherapy-induced neuropathy (HCC) 02/06/2022   Right sided weakness 02/06/2022   Transaminitis 11/20/2021   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 10/25/2021   Malignant neoplasm of left breast in female, estrogen receptor positive (HCC) 06/25/2021   Genetic testing 01/15/2021   Family history of cancer    Invasive carcinoma of breast (HCC) 12/03/2020   Breast mass, left 07/01/2020   Situational mixed anxiety and depressive disorder 07/01/2020   Anxiety state 08/06/2014   Varicose veins of lower extremity 08/06/2014    PCP: Evelena Peat, PA  REFERRING PROVIDER: Evelena Peat, [place]  REFERRING DIAG: migraine without aura and without status migrainosus, not intractable   THERAPY DIAG:  Cervicalgia  Neuralgia and neuritis  Muscle weakness (generalized)  Nonintractable headache, unspecified chronicity pattern, unspecified headache type  Rationale for Evaluation and Treatment: Rehabilitation  ONSET DATE: neck pain chronic over many years, hand paresthesia onset with cancer treatment in 2022  PERTINENT HISTORY:  Patient is a 32 y.o. female who presents to outpatient physical therapy with a referral for medical diagnosis migraine without aura and without status migrainosus, not intractable. This patient's chief complaints  consist of chronic neck pain (R > L) and migriane headaches and bilateral hand/forearm paresthesia (R > L, C8/ulnar nerve distribution worse) leading to the following functional deficits: difficulty with activities that require use B UE, activities with fine motor control, sorting mail/paperwork, crochet, crafting,  working, activities with awkward or prolonged postures. 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  Patient denies hx of stroke, seizures, lung problems, heart problems, diabetes, unexplained weight loss, unexplained changes in bowel or bladder problems, osteoporosis, and spinal surgery.   SUBJECTIVE:                                                                                                                                                                                                         SUBJECTIVE STATEMENT: Patient states her neck is okay and hurting a little. She states her triceps and mid back is feeling better after she recovered some over the weekend.   PAIN:  NPRS: 2-3/10 in her neck  PATIENT GOALS: Learning how to manage symptoms or manage them better. Strength. To get better. To control it so it doesn't get any worse.   OBJECTIVE  TODAY'S TREATMENT:   Therapeutic exercise: to centralize symptoms and improve ROM, strength, muscular endurance, and activity tolerance required for successful completion of functional activities.  - NuStep level 5 using bilateral upper and lower extremities. Seat/handle setting 11/12. For improved extremity mobility, muscular endurance, and activity tolerance; and to induce the analgesic effect of aerobic exercise, stimulate improved joint nutrition, and prepare body structures and systems for following interventions. x 5  minutes. Average SPM = 67. (Manual therapy / dry needling - see below).   Circuit:  - prone  scapular retraction with B UE in "W"  position with overhead press to "Y" position and back before resting, 2x10 - prone cervical spine retraction, 2x10 with 5 second hold.   Manual therapy: to reduce pain and tissue tension, improve range of motion, neuromodulation, in order to promote improved ability to complete functional activities.  PRONE with pillow under ankles - STM to bilateral suboccipital, cervical paraspinal, and upper trap regions   Modality: Dry needling performed to posterior cervical spine region to decrease pain and spasms along patient's neck and head region with patient in prone utilizing 3  dry needle(s) .30mm x 30mm with 1 sticks at each side in suboccipital muscles,1 stick at each cervical spine multifidus at approx C4, and utilizing 1 dry needle(s) .25mm x 30mm with 2-3 sticks at each upper trap. Patient educated about the risks and benefits from therapy and verbally consents to treatment. Moderate to large twitch response noted at both upper traps. Dry needling performed by Luretha Murphy. Ilsa Iha PT, DPT who is certified in this technique.  Pt required multimodal cuing for proper technique and to facilitate improved neuromuscular control, strength, range of motion, and functional ability resulting in improved performance and form.  PATIENT EDUCATION:  Education details: Exercise purpose/form. Self management techniques. Education on diagnosis, prognosis, POC, anatomy and physiology of current condition Education on HEP including handout  Person educated: Patient Education method: Explanation, Demonstration, Tactile cues, Verbal cues, and Handouts Education comprehension: verbalized understanding, returned demonstration, and needs further education  HOME EXERCISE PROGRAM: Access Code: [email or web address] URL: [email or web address] Date: 04/29/2023 Prepared by: Norton Blizzard  Exercises - Supine Deep Neck Flexor Nods  - 1 x daily - 2 sets - 10 reps - 5-10 second  hold - Standing Bilateral Low Shoulder Row with  Anchored Resistance  - 1 x daily - 3 sets - 10 reps - Sidelying Thoracic Rotation with Open Book  - 1 x daily - 1-2 sets - 10 reps - 5 seconds/1 breath hold - Prone W Scapular Retraction  - 3-5 x weekly - 1-2 sets - 10 reps - 5 seconds hold - Prone Scapular Retraction Arms at Side  - 3-5 x weekly - 1-3 sets - 10 reps - Prone Scapular Retraction Y  - 3-5 x weekly - 1-3 sets - 10 reps  HOME EXERCISE PROGRAM [[id number]] View at [email or web address]" using code: [id number] Sustained cervical flexion/retraction in sitting  -  Repeat 3 Repetitions, Hold 30 Seconds, Complete 1 Set, Times a Day  ASSESSMENT:  CLINICAL IMPRESSION: Patient arrives reporting continued neck pain but not as sore as last PT session. Continued with dry needling with strong twitch response at B UT (L>R) followed by continued postural strengthening with progressions as tolerated. Patient felt strong fatigue with strengthening exercises and required cuing for effective form. Patient would benefit from continued management of limiting condition by skilled physical therapist to address remaining impairments and functional limitations to work towards stated goals and return to PLOF or maximal functional independence.   From Initial PT Evaluation 04/13/2023:  Patient is a 32 y.o. female referred to outpatient physical therapy with a medical diagnosis of migraine without aura and without status migrainosus, not intractable who presents with signs and symptoms consistent with chronic neck pain and intermittent migraines bilateral hand and forearm paresthesia in glove pattern with worst symptoms at 5th digit. Patient's hand symptoms continue to seem best explained as chemotherapy-induced peripheral neuropathy which is often unable to be detected on nerve conduction testing despite being clinically present due to the fine nerves being affected, while nerve conduction studies are only able to measure changes in large myelinated nerve fibers. Unable  to make connection between neck and hand symptoms during exam. Will continue to be alert for signs of radiculopathy and address accordingly. Patient has potential for improved hand strength and control with physical therapy despite the presence of paresthesia. Upon exam, patient was very tender to palpation at suboccipital region which can be a source of cervicogenic contribution to headache or migraine trigger.  Patient presents with significant pain, ROM, joint stiffness, muscle tension, paresthesia, motor control, muscle performance (  strength/power/endurance) and activity tolerance impairments that are limiting ability to complete her usual activities such as those that require use B UE, activities with fine motor control, sorting mail/paperwork, crochet, crafting, working, activities with awkward or prolonged postures without difficulty. Patient will benefit from skilled physical therapy intervention to address current body structure impairments and activity limitations to improve function and work towards goals set in current POC in order to return to prior level of function or maximal functional improvement.   OBJECTIVE IMPAIRMENTS: decreased activity tolerance, decreased coordination, decreased endurance, decreased knowledge of condition, decreased ROM, decreased strength, impaired perceived functional ability, increased muscle spasms, impaired flexibility, impaired sensation, impaired UE functional use, and pain.   ACTIVITY LIMITATIONS: hygiene/grooming and   difficulty with activities that require use B UE, activities with fine motor control, sorting mail/paperwork, crochet, crafting, working, activities with awkward or prolonged postures  PARTICIPATION LIMITATIONS: meal prep, cleaning, interpersonal relationship, and occupation  PERSONAL FACTORS: Past/current experiences, Profession, Time since onset of injury/illness/exacerbation, and 3+ comorbidities:   Anxiety state; Breast mass, left;  Situational mixed anxiety and depressive disorder; Varicose veins of lower extremity; Invasive carcinoma of breast (2022);  Malignant neoplasm of left breast in female, estrogen receptor positive (HCC); 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   REHAB POTENTIAL: Good  CLINICAL DECISION MAKING: Evolving/moderate complexity  EVALUATION COMPLEXITY: Moderate    GOALS: Goals reviewed with patient? No  SHORT TERM GOALS: Target date: 04/27/2023  Patient will be independent with initial home exercise program for self-management of symptoms. Baseline: Initial HEP provided at IE (04/13/23); Goal status: In-progress   LONG TERM GOALS: Target date: 07/06/2023  Patient will be independent with a long-term home exercise program for self-management of symptoms.  Baseline: Initial HEP provided at IE (04/13/23); Goal status: In-progress  2.  Patient will demonstrate improved FOTO to equal or greater than 69 by visit #10 to demonstrate improvement in overall condition and self-reported functional ability.  Baseline: 59 (04/13/23); Goal status: In-progress  3.  Patient will demonstrate cervical spine rotation equal or greater than 60 degrees bilaterally without increase in pain to improve her ability to check blind spot while driving.  Baseline: R/L 55/54 degrees (04/13/23); Goal status: In-progress  4.  Patient will demonstrate improved grip strength by 5lbs each in both UE to demonstrate improvement in B UE strength and ability to use hands for manual tasks such as working.  Baseline: to be measured visit 2 as appropriate (04/13/23); Goal status: In-progress  5.  Patient will complete community, work and/or recreational activities with 50% less limitation due to current condition.  Baseline: difficulty with activities that require use B  UE, activities with fine motor control, sorting mail/paperwork, crochet, crafting, working, activities with awkward or prolonged postures (04/13/23); Goal status: In-progress   PLAN:  PT FREQUENCY: 1-2x/week  PT DURATION: 12 weeks  PLANNED INTERVENTIONS: Therapeutic exercises, Therapeutic activity, Neuromuscular re-education, Patient/Family education, Self Care, Joint mobilization, Dry Needling, Electrical stimulation, Spinal mobilization, Cryotherapy, Moist heat, Manual therapy, and Re-evaluation  PLAN FOR NEXT SESSION: progressive neck/posture/UE/functional strengthening, motor control, and ROM exercises as tolerated. Education. Manual therapy and dry needling as needed. Neurodynamics as appropriate.    Cira Rue, PT, DPT 05/04/2023, 3:22 PM  [facility] [address] [place], [place] [address] P: [phone] I F: [phone]

## 2023-05-06 ENCOUNTER — Encounter: Payer: Self-pay | Admitting: Physical Therapy

## 2023-05-06 ENCOUNTER — Ambulatory Visit: Payer: Commercial Managed Care - HMO | Attending: Medical | Admitting: Physical Therapy

## 2023-05-06 DIAGNOSIS — M6281 Muscle weakness (generalized): Secondary | ICD-10-CM | POA: Diagnosis present

## 2023-05-06 DIAGNOSIS — M792 Neuralgia and neuritis, unspecified: Secondary | ICD-10-CM | POA: Diagnosis present

## 2023-05-06 DIAGNOSIS — R519 Headache, unspecified: Secondary | ICD-10-CM | POA: Diagnosis present

## 2023-05-06 DIAGNOSIS — M542 Cervicalgia: Secondary | ICD-10-CM | POA: Diagnosis present

## 2023-05-06 DIAGNOSIS — I972 Postmastectomy lymphedema syndrome: Secondary | ICD-10-CM | POA: Diagnosis present

## 2023-05-06 NOTE — Therapy (Signed)
OUTPATIENT PHYSICAL THERAPY TREATMENT NOTE   Patient Name: Vickie Mathis MRN: [id number] DOB:06-Apr-1991, 32 y.o., female Today's Date: 05/06/2023  END OF SESSION:  PT End of Session - 05/06/23 1519     Visit Number 8    Number of Visits 13    Date for PT Re-Evaluation 07/06/23    Authorization Type [employer] reporting period from 04/13/2023    Progress Note Due on Visit 10    PT Start Time 1519    PT Stop Time 1557    PT Time Calculation (min) 38 min    Activity Tolerance Patient tolerated treatment well    Behavior During Therapy [facility] for tasks assessed/performed              Past Medical History:  Diagnosis Date   Anxiety    Asthma    Breast cancer (HCC) 11/2020   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 AXILLARY NODE DISSECTION;  Surgeon: Carolan Shiver, MD;  Location: [facility];  Service: General;  Laterality: Bilateral;  [place], 1.5 hours [place]Diaz 2.5 hours   BREAST BIOPSY Left 11/26/2020   vision 12:00 6cmfn [facility]   BREAST BIOPSY Left 11/26/2020   LN bx, hydro marker,  fragments of macrometastatic carcinoma   BREAST RECONSTRUCTION WITH PLACEMENT OF TISSUE EXPANDER AND FLEX HD (ACELLULAR HYDRATED DERMIS) Bilateral 06/25/2021   Procedure: BREAST RECONSTRUCTION WITH PLACEMENT OF TISSUE EXPANDER AND FLEX HD (ACELLULAR HYDRATED DERMIS);  Surgeon: Peggye Form, DO;  Location: [facility];  Service: Plastics;  Laterality: Bilateral;   PORTACATH PLACEMENT Right 12/13/2020   Procedure: INSERTION PORT-A-CATH;  Surgeon: Carolan Shiver, MD;  Location: [facility];  Service: General;  Laterality: Right;    REMOVAL OF BILATERAL TISSUE EXPANDERS WITH PLACEMENT OF BILATERAL BREAST IMPLANTS Bilateral 09/15/2021   Procedure: REMOVAL OF BILATERAL TISSUE EXPANDERS;  Surgeon: Peggye Form, DO;  Location: [facility];  Service: Plastics;  Laterality: Bilateral;   Patient Active Problem List   Diagnosis Date Noted   Chemotherapy-induced neuropathy (HCC) 02/06/2022   Right sided weakness 02/06/2022   Transaminitis 11/20/2021   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 10/25/2021   Malignant neoplasm of left breast in female, estrogen receptor positive (HCC) 06/25/2021   Genetic testing 01/15/2021   Family history of cancer    Invasive carcinoma of breast (HCC) 12/03/2020   Breast mass, left 07/01/2020   Situational mixed anxiety and depressive disorder 07/01/2020   Anxiety state 08/06/2014   Varicose veins of lower extremity 08/06/2014    PCP: Evelena Peat, PA  REFERRING PROVIDER: Evelena Peat, [place]  REFERRING DIAG: migraine without aura and without status migrainosus, not intractable   THERAPY DIAG:  Cervicalgia  Neuralgia and neuritis  Muscle weakness (generalized)  Nonintractable headache, unspecified chronicity pattern, unspecified headache type  Rationale for Evaluation and Treatment: Rehabilitation  ONSET DATE: neck pain chronic over many years, hand paresthesia onset with cancer treatment in 2022  PERTINENT HISTORY:  Patient is a 32 y.o. female who presents to outpatient physical therapy with a referral for medical diagnosis migraine without aura and without status migrainosus, not intractable. This patient's chief  complaints consist of chronic neck pain (R > L) and migriane headaches and bilateral hand/forearm paresthesia (R > L, C8/ulnar nerve distribution worse) leading to the following functional deficits: difficulty with activities that require use B UE, activities with fine motor control, sorting mail/paperwork, crochet, crafting,  working, activities with awkward or prolonged postures. 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  Patient denies hx of stroke, seizures, lung problems, heart problems, diabetes, unexplained weight loss, unexplained changes in bowel or bladder problems, osteoporosis, and spinal surgery.   SUBJECTIVE:                                                                                                                                                                                                         SUBJECTIVE STATEMENT: Patient states her neck is still sore from dry needling last PT session. She is sore especially in the R > L upper trap. She spent some time walking outside today.   PAIN:  NPRS: 4/10 in right neck, low back, and legs.   PATIENT GOALS: Learning how to manage symptoms or manage them better. Strength. To get better. To control it so it doesn't get any worse.   OBJECTIVE  TODAY'S TREATMENT:   Therapeutic exercise: to centralize symptoms and improve ROM, strength, muscular endurance, and activity tolerance required for successful completion of functional activities.  - Upper body ergometer level 7 to encourage joint nutrition, warm tissue, induce analgesic effect of aerobic exercise, improve muscular strength and endurance,  and prepare for remainder of session. 5 min.  - Sidelying open book (thoracic rotation) to improve thoracic, shoulder girdle, and upper trunk mobility. 1x10 each side with 5 second hold.  - hooklying B shoulder flexion overhead with stick loaded with 5#AW while laying on  half foam roll, 1x10 with 5 second hold.  - hooklying on half foam roll, arm angels, 1x10 - prone scapular retraction with B UE in "W" position while maintaining cervical spine retraction, 3x10.  - half hooklying curl up with neutral lumbar spine, hands across chest, 5x30 seconds with 30 second rests between sets.  - seated lat pull down, 2x10 with 25# - Education on HEP including handout   Pt required multimodal cuing for proper technique and to facilitate improved neuromuscular control, strength,  range of motion, and functional ability resulting in improved performance and form.  PATIENT EDUCATION:  Education details: Exercise purpose/form. Self management techniques. Education on diagnosis, prognosis, POC, anatomy and physiology of current condition Education on HEP including handout  Person educated: Patient Education method: Explanation, Demonstration, Tactile cues, Verbal cues, and Handouts Education comprehension: verbalized understanding, returned demonstration, and needs further education  HOME EXERCISE PROGRAM: Access Code: [email or web address] URL: [email or web address] Date: 05/06/2023 Prepared by: Norton Blizzard  Exercises - Standing Bilateral Low Shoulder Row with Anchored Resistance  - 1 x daily - 3 sets - 10 reps - Sidelying Thoracic Rotation with Open Book  - 1 x daily - 1-2 sets - 10 reps - 5 seconds/1 breath hold - Prone W Scapular Retraction  - 3-5 x weekly - 3 sets - 10 reps - Neutral Curl Up with Arms Across Chest  - 1 x daily - 3-5 x weekly - 5 sets - 30 seconds hold  HOME EXERCISE PROGRAM [[id number]] View at [email or web address]" using code: [id number] Sustained cervical flexion/retraction in sitting  -  Repeat 3 Repetitions, Hold 30 Seconds, Complete 1 Set, Times a Day  ASSESSMENT:  CLINICAL IMPRESSION: Patient arrives reporting continued neck soreness after dry needling last PT session. Today's session focused on ROM, strengthening, and motor control exercises  to improve activity tolerance and lower overall neck pain. Patient was able to complete exercises with tolerable discomfort and no overall increase in pain by end of session. Patient is demonstrating improving strength and activity tolerance but continues to be limited by pain and tightness in her neck and upper traps. Patient would benefit from continued management of limiting condition by skilled physical therapist to address remaining impairments and functional limitations to work towards stated goals and return to PLOF or maximal functional independence.   From Initial PT Evaluation 04/13/2023:  Patient is a 32 y.o. female referred to outpatient physical therapy with a medical diagnosis of migraine without aura and without status migrainosus, not intractable who presents with signs and symptoms consistent with chronic neck pain and intermittent migraines bilateral hand and forearm paresthesia in glove pattern with worst symptoms at 5th digit. Patient's hand symptoms continue to seem best explained as chemotherapy-induced peripheral neuropathy which is often unable to be detected on nerve conduction testing despite being clinically present due to the fine nerves being affected, while nerve conduction studies are only able to measure changes in large myelinated nerve fibers. Unable to make connection between neck and hand symptoms during exam. Will continue to be alert for signs of radiculopathy and address accordingly. Patient has potential for improved hand strength and control with physical therapy despite the presence of paresthesia. Upon exam, patient was very tender to palpation at suboccipital region which can be a source of cervicogenic contribution to headache or migraine trigger.  Patient presents with significant pain, ROM, joint stiffness, muscle tension, paresthesia, motor control, muscle performance (strength/power/endurance) and activity tolerance impairments that are limiting ability to complete  her usual activities such as those that require use B UE, activities with fine motor control, sorting mail/paperwork, crochet, crafting, working, activities with awkward or prolonged postures without difficulty. Patient will benefit from skilled physical therapy intervention to address current body structure impairments and activity limitations to improve function and work towards goals set in current POC in order to return to prior level of function or maximal functional improvement.   OBJECTIVE IMPAIRMENTS: decreased activity tolerance, decreased coordination, decreased endurance, decreased knowledge of condition, decreased ROM, decreased strength, impaired perceived functional  ability, increased muscle spasms, impaired flexibility, impaired sensation, impaired UE functional use, and pain.   ACTIVITY LIMITATIONS: hygiene/grooming and   difficulty with activities that require use B UE, activities with fine motor control, sorting mail/paperwork, crochet, crafting, working, activities with awkward or prolonged postures  PARTICIPATION LIMITATIONS: meal prep, cleaning, interpersonal relationship, and occupation  PERSONAL FACTORS: Past/current experiences, Profession, Time since onset of injury/illness/exacerbation, and 3+ comorbidities:   Anxiety state; Breast mass, left; Situational mixed anxiety and depressive disorder; Varicose veins of lower extremity; Invasive carcinoma of breast (2022);  Malignant neoplasm of left breast in female, estrogen receptor positive (HCC); 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   REHAB POTENTIAL: Good  CLINICAL DECISION MAKING: Evolving/moderate complexity  EVALUATION COMPLEXITY: Moderate    GOALS: Goals reviewed with patient? No  SHORT TERM GOALS: Target date: 04/27/2023  Patient will be independent with  initial home exercise program for self-management of symptoms. Baseline: Initial HEP provided at IE (04/13/23); Goal status: In-progress   LONG TERM GOALS: Target date: 07/06/2023  Patient will be independent with a long-term home exercise program for self-management of symptoms.  Baseline: Initial HEP provided at IE (04/13/23); Goal status: In-progress  2.  Patient will demonstrate improved FOTO to equal or greater than 69 by visit #10 to demonstrate improvement in overall condition and self-reported functional ability.  Baseline: 59 (04/13/23); Goal status: In-progress  3.  Patient will demonstrate cervical spine rotation equal or greater than 60 degrees bilaterally without increase in pain to improve her ability to check blind spot while driving.  Baseline: R/L 55/54 degrees (04/13/23); Goal status: In-progress  4.  Patient will demonstrate improved grip strength by 5lbs each in both UE to demonstrate improvement in B UE strength and ability to use hands for manual tasks such as working.  Baseline: to be measured visit 2 as appropriate (04/13/23); Goal status: In-progress  5.  Patient will complete community, work and/or recreational activities with 50% less limitation due to current condition.  Baseline: difficulty with activities that require use B UE, activities with fine motor control, sorting mail/paperwork, crochet, crafting, working, activities with awkward or prolonged postures (04/13/23); Goal status: In-progress   PLAN:  PT FREQUENCY: 1-2x/week  PT DURATION: 12 weeks  PLANNED INTERVENTIONS: Therapeutic exercises, Therapeutic activity, Neuromuscular re-education, Patient/Family education, Self Care, Joint mobilization, Dry Needling, Electrical stimulation, Spinal mobilization, Cryotherapy, Moist heat, Manual therapy, and Re-evaluation  PLAN FOR NEXT SESSION: progressive neck/posture/UE/functional strengthening, motor control, and ROM exercises as tolerated. Education.  Manual therapy and dry needling as needed. Neurodynamics as appropriate.    Cira Rue, PT, DPT 05/06/2023, 8:00 PM  [facility] [address] [place], [place] [address] P: [phone] I F: [phone]

## 2023-05-07 ENCOUNTER — Inpatient Hospital Stay: Payer: Commercial Managed Care - HMO

## 2023-05-07 ENCOUNTER — Inpatient Hospital Stay: Payer: Commercial Managed Care - HMO | Attending: Oncology

## 2023-05-07 DIAGNOSIS — Z95828 Presence of other vascular implants and grafts: Secondary | ICD-10-CM

## 2023-05-07 DIAGNOSIS — R7401 Elevation of levels of liver transaminase levels: Secondary | ICD-10-CM

## 2023-05-07 DIAGNOSIS — Z853 Personal history of malignant neoplasm of breast: Secondary | ICD-10-CM | POA: Diagnosis present

## 2023-05-07 DIAGNOSIS — Z171 Estrogen receptor negative status [ER-]: Secondary | ICD-10-CM

## 2023-05-07 LAB — COMPREHENSIVE METABOLIC PANEL
ALT: 24 U/L (ref 0–44)
AST: 24 U/L (ref 15–41)
Albumin: 4.5 g/dL (ref 3.5–5.0)
Alkaline Phosphatase: 68 U/L (ref 38–126)
Anion gap: 9 (ref 5–15)
BUN: 11 mg/dL (ref 6–20)
CO2: 22 mmol/L (ref 22–32)
Calcium: 8.8 mg/dL — ABNORMAL LOW (ref 8.9–10.3)
Chloride: 101 mmol/L (ref 98–111)
Creatinine, Ser: 0.64 mg/dL (ref 0.44–1.00)
GFR, Estimated: >60 mL/min (ref >60)
Glucose, Bld: 103 mg/dL — ABNORMAL HIGH (ref 70–99)
Potassium: 3.7 mmol/L (ref 3.5–5.1)
Sodium: 132 mmol/L — ABNORMAL LOW (ref 135–145)
Total Bilirubin: 0.4 mg/dL (ref 0.3–1.2)
Total Protein: 7.4 g/dL (ref 6.5–8.1)

## 2023-05-07 LAB — CBC WITH DIFFERENTIAL/PLATELET
Abs Immature Granulocytes: 0.05 10*3/uL (ref 0.00–0.07)
Basophils Absolute: 0 10*3/uL (ref 0.0–0.1)
Basophils Relative: 0 %
Eosinophils Absolute: 0 10*3/uL (ref 0.0–0.5)
Eosinophils Relative: 0 %
HCT: 36.9 % (ref 36.0–46.0)
Hemoglobin: 12.8 g/dL (ref 12.0–15.0)
Immature Granulocytes: 1 %
Lymphocytes Relative: 11 %
Lymphs Abs: 1.2 10*3/uL (ref 0.7–4.0)
MCH: 32.4 pg (ref 26.0–34.0)
MCHC: 34.7 g/dL (ref 30.0–36.0)
MCV: 93.4 fL (ref 80.0–100.0)
Monocytes Absolute: 0.7 10*3/uL (ref 0.1–1.0)
Monocytes Relative: 7 %
Neutro Abs: 8.5 10*3/uL — ABNORMAL HIGH (ref 1.7–7.7)
Neutrophils Relative %: 81 %
Platelets: 203 10*3/uL (ref 150–400)
RBC: 3.95 MIL/uL (ref 3.87–5.11)
RDW: 12.7 % (ref 11.5–15.5)
WBC: 10.6 10*3/uL — ABNORMAL HIGH (ref 4.0–10.5)
nRBC: 0 % (ref 0.0–0.2)

## 2023-05-07 MED ORDER — SODIUM CHLORIDE 0.9% FLUSH
10.0000 mL | Freq: Once | INTRAVENOUS | Status: AC
  Administered 2023-05-07: 10 mL via INTRAVENOUS
  Filled 2023-05-07: qty 10

## 2023-05-07 MED ORDER — HEPARIN SOD (PORK) LOCK FLUSH 100 UNIT/ML IV SOLN
500.0000 [IU] | Freq: Once | INTRAVENOUS | Status: AC
  Administered 2023-05-07: 500 [IU] via INTRAVENOUS
  Filled 2023-05-07: qty 5

## 2023-05-10 ENCOUNTER — Ambulatory Visit: Payer: Commercial Managed Care - HMO | Admitting: Physical Therapy

## 2023-05-12 ENCOUNTER — Ambulatory Visit: Payer: Commercial Managed Care - HMO | Admitting: Physical Therapy

## 2023-05-13 ENCOUNTER — Encounter: Payer: Self-pay | Admitting: Physical Therapy

## 2023-05-13 ENCOUNTER — Ambulatory Visit: Payer: Commercial Managed Care - HMO | Admitting: Physical Therapy

## 2023-05-13 DIAGNOSIS — M542 Cervicalgia: Secondary | ICD-10-CM

## 2023-05-13 DIAGNOSIS — M6281 Muscle weakness (generalized): Secondary | ICD-10-CM

## 2023-05-13 DIAGNOSIS — R519 Headache, unspecified: Secondary | ICD-10-CM

## 2023-05-13 DIAGNOSIS — M792 Neuralgia and neuritis, unspecified: Secondary | ICD-10-CM

## 2023-05-13 NOTE — Therapy (Signed)
OUTPATIENT PHYSICAL THERAPY TREATMENT   Patient Name: Elneta Mesko MRN: [id number] DOB:Mar 16, 1991, 32 y.o., female Today's Date: 05/13/2023  END OF SESSION:  PT End of Session - 05/13/23 1354     Visit Number 9    Number of Visits 13    Date for PT Re-Evaluation 07/06/23    Authorization Type [employer] reporting period from 04/13/2023    Progress Note Due on Visit 10    PT Start Time 1352    PT Stop Time 1425    PT Time Calculation (min) 33 min    Activity Tolerance Patient tolerated treatment well    Behavior During Therapy [facility] for tasks assessed/performed             Past Medical History:  Diagnosis Date   Anxiety    Asthma    Breast cancer (HCC) 11/2020   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 AXILLARY NODE DISSECTION;  Surgeon: Carolan Shiver, MD;  Location: [facility];  Service: General;  Laterality: Bilateral;  [place], 1.5 hours Cintron-Diaz 2.5 hours   BREAST BIOPSY Left 11/26/2020   vision 12:00 6cmfn [facility]   BREAST BIOPSY Left 11/26/2020   LN bx, hydro marker,  fragments of macrometastatic carcinoma   BREAST RECONSTRUCTION WITH PLACEMENT OF TISSUE EXPANDER AND FLEX HD (ACELLULAR HYDRATED DERMIS) Bilateral 06/25/2021   Procedure: BREAST RECONSTRUCTION WITH PLACEMENT OF TISSUE EXPANDER AND FLEX HD (ACELLULAR HYDRATED DERMIS);  Surgeon: Peggye Form, DO;  Location: [facility];  Service: Plastics;  Laterality: Bilateral;   PORTACATH PLACEMENT Right 12/13/2020   Procedure: INSERTION PORT-A-CATH;  Surgeon: Carolan Shiver, MD;  Location: [facility];  Service: General;  Laterality: Right;   REMOVAL OF  BILATERAL TISSUE EXPANDERS WITH PLACEMENT OF BILATERAL BREAST IMPLANTS Bilateral 09/15/2021   Procedure: REMOVAL OF BILATERAL TISSUE EXPANDERS;  Surgeon: Peggye Form, DO;  Location: [facility];  Service: Plastics;  Laterality: Bilateral;   Patient Active Problem List   Diagnosis Date Noted   Chemotherapy-induced neuropathy (HCC) 02/06/2022   Right sided weakness 02/06/2022   Transaminitis 11/20/2021   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 10/25/2021   Malignant neoplasm of left breast in female, estrogen receptor positive (HCC) 06/25/2021   Genetic testing 01/15/2021   Family history of cancer    Invasive carcinoma of breast (HCC) 12/03/2020   Breast mass, left 07/01/2020   Situational mixed anxiety and depressive disorder 07/01/2020   Anxiety state 08/06/2014   Varicose veins of lower extremity 08/06/2014    PCP: Evelena Peat, PA  REFERRING PROVIDER: Evelena Peat, [place]  REFERRING DIAG: migraine without aura and without status migrainosus, not intractable   THERAPY DIAG:  Cervicalgia  Neuralgia and neuritis  Muscle weakness (generalized)  Nonintractable headache, unspecified chronicity pattern, unspecified headache type  Rationale for Evaluation and Treatment: Rehabilitation  ONSET DATE: neck pain chronic over many years, hand paresthesia onset with cancer treatment in 2022  PERTINENT HISTORY:  Patient is a 32 y.o. female who presents to outpatient physical therapy with a referral for medical diagnosis migraine without aura and without status migrainosus, not intractable. This patient's chief complaints consist  of chronic neck pain (R > L) and migriane headaches and bilateral hand/forearm paresthesia (R > L, C8/ulnar nerve distribution worse) leading to the following functional deficits: difficulty with activities that require use B UE, activities with fine motor control, sorting mail/paperwork, crochet, crafting, working,  activities with awkward or prolonged postures. 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  Patient denies hx of stroke, seizures, lung problems, heart problems, diabetes, unexplained weight loss, unexplained changes in bowel or bladder problems, osteoporosis, and spinal surgery.   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 She has been doing her HEP. She didn't finish her route this morning and has about 1 hour of work after today's PT session.   PAIN:  NPRS: 5-6/10 in bilateral upper trap region and up the side of her neck on the right  PATIENT GOALS: Learning how to manage symptoms or manage them better. Strength. To get better. To control it so it doesn't get any worse.   OBJECTIVE  TODAY'S TREATMENT:   Therapeutic exercise: to centralize symptoms and improve ROM, strength, muscular endurance, and activity tolerance required for successful completion of functional activities.  - Upper body ergometer level 7 to encourage joint nutrition, warm tissue, induce analgesic effect of aerobic exercise, improve muscular strength and endurance,  and prepare for remainder of session. 5 min.  (Manual therapy / dry needling - see below)  - prone scapular retraction with B UE in "W" position while maintaining cervical  spine retraction, 3x10.  - seated lat pull down, 3x10 with 25/30/35#  Manual therapy: to reduce pain and tissue tension, improve range of motion, neuromodulation, in order to promote improved ability to complete functional activities. PRONE  - STM to posterior neck musculature focusing on cervical spine paraspinals and  bilateral UT.   Modality: Dry needling performed to bilateral upper traps and cervical multifidus to decrease pain and spasms along patient's neck region with patient in prone utilizing 1/2 dry needle(s) .30/.65mm  x 30/70mm with 1 stick each side at cervical multifidus at approx C5 and 2 sticks at each upper trap . Patient educated about the risks and benefits from therapy and verbally consents to treatment. Patient with prolific  twitch response at B UT.  Dry needling performed by Luretha Murphy. Ilsa Iha PT, DPT who is certified in this technique.   Pt required multimodal cuing for proper technique and to facilitate improved neuromuscular control, strength, range of motion, and functional ability resulting in improved performance and form.  PATIENT EDUCATION:  Education details: Exercise purpose/form. Self management techniques. Education on diagnosis, prognosis, POC, anatomy and physiology of current condition Education on HEP including handout  Person educated: Patient Education method: Explanation, Demonstration, Tactile cues, Verbal cues, and Handouts Education comprehension: verbalized understanding, returned demonstration, and needs further education  HOME EXERCISE PROGRAM: Access Code: [email or web address] URL: [email or web address] Date: 05/06/2023 Prepared by: Norton Blizzard  Exercises - Standing Bilateral Low Shoulder Row with Anchored Resistance  - 1 x daily - 3 sets - 10 reps - Sidelying Thoracic Rotation with Open Book  - 1 x daily - 1-2 sets - 10 reps - 5 seconds/1 breath hold - Prone W Scapular Retraction  - 3-5 x weekly - 3 sets - 10 reps - Neutral Curl Up with  Arms Across Chest  - 1 x daily - 3-5 x weekly - 5 sets - 30 seconds hold  HOME EXERCISE PROGRAM [[id number]] View at [email or web address]" using code: [id number] Sustained cervical flexion/retraction in sitting  -  Repeat 3 Repetitions, Hold 30 Seconds, Complete 1 Set, Times a Day  ASSESSMENT:  CLINICAL IMPRESSION: Patient arrives reporting increased neck pain after starting harder shift at work. Utilized dry needling with strong and prolific twitch response at bilateral UT resulting in increased achiness in the B UT. Continued with postural strengthening with progressions in lat pull down. Patient would benefit from continued management of limiting condition by skilled physical therapist to address remaining impairments and functional limitations to work towards stated goals and return to PLOF or maximal functional independence.   From Initial PT Evaluation 04/13/2023:  Patient is a 32 y.o. female referred to outpatient physical therapy with a medical diagnosis of migraine without aura and without status migrainosus, not intractable who presents with signs and symptoms consistent with chronic neck pain and intermittent migraines bilateral hand and forearm paresthesia in glove pattern with worst symptoms at 5th digit. Patient's hand symptoms continue to seem best explained as chemotherapy-induced peripheral neuropathy which is often unable to be detected on nerve conduction testing despite being clinically present due to the fine nerves being affected, while nerve conduction studies are only able to measure changes in large myelinated nerve fibers. Unable to make connection between neck and hand symptoms during exam. Will continue to be alert for signs of radiculopathy and address accordingly. Patient has potential for improved hand strength and control with physical therapy despite the presence of paresthesia. Upon exam, patient was very tender to palpation at suboccipital region which can be a source of  cervicogenic contribution to headache or migraine trigger.  Patient presents with significant pain, ROM, joint stiffness, muscle tension, paresthesia, motor control, muscle performance (strength/power/endurance) and activity tolerance impairments that are limiting ability to complete her usual activities such as those that require use B UE, activities with fine motor control, sorting mail/paperwork, crochet, crafting, working, activities with awkward or prolonged postures without difficulty. Patient will benefit from skilled physical therapy intervention to address current body structure impairments and activity limitations to  improve function and work towards goals set in current POC in order to return to prior level of function or maximal functional improvement.   OBJECTIVE IMPAIRMENTS: decreased activity tolerance, decreased coordination, decreased endurance, decreased knowledge of condition, decreased ROM, decreased strength, impaired perceived functional ability, increased muscle spasms, impaired flexibility, impaired sensation, impaired UE functional use, and pain.   ACTIVITY LIMITATIONS: hygiene/grooming and   difficulty with activities that require use B UE, activities with fine motor control, sorting mail/paperwork, crochet, crafting, working, activities with awkward or prolonged postures  PARTICIPATION LIMITATIONS: meal prep, cleaning, interpersonal relationship, and occupation  PERSONAL FACTORS: Past/current experiences, Profession, Time since onset of injury/illness/exacerbation, and 3+ comorbidities:   Anxiety state; Breast mass, left; Situational mixed anxiety and depressive disorder; Varicose veins of lower extremity; Invasive carcinoma of breast (2022);  Malignant neoplasm of left breast in female, estrogen receptor positive (HCC); 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   REHAB POTENTIAL: Good  CLINICAL DECISION MAKING: Evolving/moderate complexity  EVALUATION COMPLEXITY: Moderate    GOALS: Goals reviewed with patient? No  SHORT TERM GOALS: Target date: 04/27/2023  Patient will be independent with initial home exercise program for self-management of symptoms. Baseline: Initial HEP provided at IE (04/13/23); Goal status: In-progress   LONG TERM GOALS: Target date: 07/06/2023  Patient will be independent with a long-term home exercise program for self-management of symptoms.  Baseline: Initial HEP provided at IE (04/13/23); Goal status: In-progress  2.  Patient will demonstrate improved FOTO to equal or greater than 69 by visit #10 to demonstrate improvement in overall condition and self-reported functional ability.  Baseline: 59 (04/13/23); Goal status: In-progress  3.  Patient will demonstrate cervical spine rotation equal or greater than 60 degrees bilaterally without increase in pain to improve her ability to check blind spot while driving.  Baseline: R/L 55/54 degrees (04/13/23); Goal status: In-progress  4.  Patient will demonstrate improved grip strength by 5lbs each in both UE to demonstrate improvement in B UE strength and ability to use hands for manual tasks such as working.  Baseline: to be measured visit 2 as appropriate (04/13/23); Goal status: In-progress  5.  Patient will complete community, work and/or recreational activities with 50% less limitation due to current condition.  Baseline: difficulty with activities that require use B UE, activities with fine motor control, sorting mail/paperwork, crochet, crafting, working, activities with awkward or prolonged postures (04/13/23); Goal status: In-progress   PLAN:  PT FREQUENCY: 1-2x/week  PT DURATION: 12 weeks  PLANNED INTERVENTIONS: Therapeutic exercises, Therapeutic activity, Neuromuscular re-education, Patient/Family education,  Self Care, Joint mobilization, Dry Needling, Electrical stimulation, Spinal mobilization, Cryotherapy, Moist heat, Manual therapy, and Re-evaluation  PLAN FOR NEXT SESSION: progressive neck/posture/UE/functional strengthening, motor control, and ROM exercises as tolerated. Education. Manual therapy and dry needling as needed. Neurodynamics as appropriate.    Cira Rue, PT, DPT 05/13/2023, 2:30 PM  [facility] [address] [place], [place] [address] P: [phone] I F: [phone]

## 2023-05-17 ENCOUNTER — Ambulatory Visit: Payer: Commercial Managed Care - HMO | Admitting: Physical Therapy

## 2023-05-17 ENCOUNTER — Encounter: Payer: Self-pay | Admitting: Physical Therapy

## 2023-05-17 DIAGNOSIS — M792 Neuralgia and neuritis, unspecified: Secondary | ICD-10-CM

## 2023-05-17 DIAGNOSIS — M542 Cervicalgia: Secondary | ICD-10-CM | POA: Diagnosis not present

## 2023-05-17 DIAGNOSIS — R519 Headache, unspecified: Secondary | ICD-10-CM

## 2023-05-17 DIAGNOSIS — M6281 Muscle weakness (generalized): Secondary | ICD-10-CM

## 2023-05-17 NOTE — Therapy (Signed)
OUTPATIENT PHYSICAL THERAPY TREATMENT / PROGRESS NOTE Dates of reporting from 04/13/2023 to 05/17/2023   Patient Name: Vickie Mathis MRN: [id number] DOB:1991-04-21, 32 y.o., female Today's Date: 05/17/2023  END OF SESSION:  PT End of Session - 05/17/23 1521     Visit Number 10    Number of Visits 23    Date for PT Re-Evaluation 07/06/23    Authorization Type [employer] reporting period from 04/13/2023    Progress Note Due on Visit 10    PT Start Time 1518    PT Stop Time 1558    PT Time Calculation (min) 40 min    Activity Tolerance Patient tolerated treatment well    Behavior During Therapy [facility] for tasks assessed/performed             Past Medical History:  Diagnosis Date   Anxiety    Asthma    Breast cancer (HCC) 11/2020   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 AXILLARY NODE DISSECTION;  Surgeon: Carolan Shiver, MD;  Location: [facility];  Service: General;  Laterality: Bilateral;  [place], 1.5 hours Cintron-Diaz 2.5 hours   BREAST BIOPSY Left 11/26/2020   vision 12:00 6cmfn [facility]   BREAST BIOPSY Left 11/26/2020   LN bx, hydro marker,  fragments of macrometastatic carcinoma   BREAST RECONSTRUCTION WITH PLACEMENT OF TISSUE EXPANDER AND FLEX HD (ACELLULAR HYDRATED DERMIS) Bilateral 06/25/2021   Procedure: BREAST RECONSTRUCTION WITH PLACEMENT OF TISSUE EXPANDER AND FLEX HD (ACELLULAR HYDRATED DERMIS);  Surgeon: Peggye Form, DO;  Location: [facility];  Service: Plastics;  Laterality: Bilateral;   PORTACATH PLACEMENT Right 12/13/2020   Procedure: INSERTION PORT-A-CATH;  Surgeon: Carolan Shiver, MD;   Location: [facility];  Service: General;  Laterality: Right;   REMOVAL OF BILATERAL TISSUE EXPANDERS WITH PLACEMENT OF BILATERAL BREAST IMPLANTS Bilateral 09/15/2021   Procedure: REMOVAL OF BILATERAL TISSUE EXPANDERS;  Surgeon: Peggye Form, DO;  Location: [facility];  Service: Plastics;  Laterality: Bilateral;   Patient Active Problem List   Diagnosis Date Noted   Chemotherapy-induced neuropathy (HCC) 02/06/2022   Right sided weakness 02/06/2022   Transaminitis 11/20/2021   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 10/25/2021   Malignant neoplasm of left breast in female, estrogen receptor positive (HCC) 06/25/2021   Genetic testing 01/15/2021   Family history of cancer    Invasive carcinoma of breast (HCC) 12/03/2020   Breast mass, left 07/01/2020   Situational mixed anxiety and depressive disorder 07/01/2020   Anxiety state 08/06/2014   Varicose veins of lower extremity 08/06/2014    PCP: Evelena Peat, PA  REFERRING PROVIDER: Evelena Peat, [place]  REFERRING DIAG: migraine without aura and without status migrainosus, not intractable   THERAPY DIAG:  Cervicalgia  Neuralgia and neuritis  Muscle weakness (generalized)  Nonintractable headache, unspecified chronicity pattern, unspecified headache type  Rationale for Evaluation and Treatment: Rehabilitation  ONSET DATE: neck pain chronic over many years, hand paresthesia onset with cancer treatment in 2022  PERTINENT HISTORY:  Patient is a 32 y.o. female who presents to outpatient physical therapy with a referral for medical diagnosis migraine without aura and  without status migrainosus, not intractable. This patient's chief complaints consist of chronic neck pain (R > L) and migriane headaches and bilateral hand/forearm paresthesia (R > L, C8/ulnar nerve distribution worse) leading to the following functional deficits: difficulty with activities that require use B UE, activities with fine  motor control, sorting mail/paperwork, crochet, crafting, working, activities with awkward or prolonged postures. 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  Patient denies hx of stroke, seizures, lung problems, heart problems, diabetes, unexplained weight loss, unexplained changes in bowel or bladder problems, osteoporosis, and spinal surgery.   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 She has been doing her HEP. She didn't finish her route this morning and has about 1 hour of work after today's PT session. Patient states the dry needling from last PT session was good. She states she did not do well doing her HEP this weekend because she felt like a "bum" after working a truck route last week. She is feeling pretty sore all over her body from twisting so much at work. She states she has a new truck which has some AC but she has to bend more awkwardly and it hurts her back more. Patient states she feels PT is helping her by strengthening her back to be able to tolerate work. It is challenging her and she needs that so she can keep up with work. She has more increased work load coming up as well.   PAIN:  NPRS: 5/10 over bilateral upper traps and at base of scull, mostly when she is tilting her head side to side.    PATIENT GOALS: Learning how to manage symptoms or manage them better. Strength. To get better. To control it so it doesn't get any worse.   OBJECTIVE  SPINE MOTION CERVICAL SPINE AROM (prior to repeated motions) *Indicates pain Flexion: 52 increased pulling at neck and B UT Extension: 70  Side Flexion:       R  37 increased tightness and right sided pain.       L 30 left neck feels "crunched" Rotation:  R 72 soreness in the right upper trap L 72 mild soreness in left upper trap  Grip strength (in pounds, average of three measures).  R: (106+91+108)/3 = 101.7 L: (102+94+86)/ 3 = 94    TODAY'S TREATMENT:   Therapeutic exercise: to centralize symptoms and improve ROM, strength, muscular endurance, and activity tolerance required for successful completion of functional activities.  - Upper body ergometer level 7 to encourage joint nutrition, warm tissue, induce analgesic effect of aerobic exercise, improve muscular strength and endurance,  and prepare for remainder of session. 5 min.  - testing to assess progress (see above)  - seated with towel roll, cervical retraction: 1x10 AROM, 1x10 with self OP (Manual therapy - see below)  - standing cervical retraction with self overpressure (towel roll vertical behind thoracic spine).  - Education on HEP including handout   Manual therapy: to reduce pain and tissue tension, improve range of motion, neuromodulation, in order to promote improved ability to complete functional activities. SEATED with towel - cervical spine retraction with clinician overpressure, 3x6 (improved ease of movement and pain in all directions after each set).   Pt required multimodal cuing for proper technique and to facilitate improved neuromuscular control, strength, range of motion, and functional ability resulting in improved performance and form.  PATIENT EDUCATION:  Education details: Exercise purpose/form. Self management techniques. Education on diagnosis,  prognosis, POC, anatomy and physiology of current condition Education on HEP including handout  Person educated: Patient Education method: Explanation, Demonstration, Tactile cues, Verbal cues, and Handouts Education comprehension: verbalized understanding, returned demonstration, and needs further education  HOME EXERCISE PROGRAM: Access Code: [email or web address] URL: [email or web address] Date: 05/06/2023 Prepared by: Norton Blizzard  Exercises - Standing Bilateral Low Shoulder Row with Anchored Resistance  - 1 x daily - 3 sets - 10 reps - Sidelying Thoracic Rotation with Open Book  - 1 x daily - 1-2 sets - 10 reps - 5 seconds/1 breath hold - Prone W Scapular Retraction  - 3-5 x weekly - 3 sets - 10 reps - Neutral Curl Up with Arms Across Chest  - 1 x daily - 3-5 x weekly - 5 sets - 30 seconds hold  HOME EXERCISE PROGRAM [[id number]] View at "[email or web address]" using code: [id number] Sustained cervical flexion/retraction in sitting  -  Repeat 3 Repetitions, Hold 30 Seconds, Complete 1 Set, Times a Day  HOME EXERCISE PROGRAM [[id number]] View at "[email or web address]" using code: [id number] Cervical Retraction in Sitting w/OP - MDT -  Repeat 20 Repetitions, Hold 1 Second(s), Complete 1 Set, Perform 8 Times a Day  ASSESSMENT:  CLINICAL IMPRESSION: Patient has attended 10 physical therapy sessions since starting current episode of care on 04/13/2023. Patient has met her grip strength and cervical spine ROM goal and made progress towards all other goals except FOTO score, which remained the same as at baseline. She started back to a truck route at work last week, which is much more physically challenging than the light duty work she had been previously doing. She was able to complete her tasks successfully but with increased bodywide soreness and fatigue. Her neck pain has improved but she continues to have end range pain at the base of the spine and base of the scull. Today's session ended with a  trial of repeated motions into retraction that further eased her pain and improved ROM. She has not yet reached her goals and  would benefit from continued PT at a rate of 2x a week for up to 10 more visits to help resolve her remaining pain and functional deficits related to her neck and B UE and return to prior level of function. Patient would benefit from continued management of limiting condition by skilled physical therapist to address remaining impairments and functional limitations to work towards stated goals and return to PLOF or maximal functional independence.   From Initial PT Evaluation 04/13/2023:  Patient is a 32 y.o. female referred to outpatient physical therapy with a medical diagnosis of migraine without aura and without status migrainosus, not intractable who presents with signs and symptoms consistent with chronic neck pain and intermittent migraines bilateral hand and forearm paresthesia in glove pattern with worst symptoms at 5th digit. Patient's hand symptoms continue to seem best explained as chemotherapy-induced peripheral neuropathy which is often unable to be detected on nerve conduction testing despite being clinically present due to the fine nerves being affected, while nerve conduction studies are only able to measure changes in large myelinated nerve fibers. Unable to make connection between neck and hand symptoms during exam. Will continue to be alert for signs of radiculopathy and address accordingly. Patient has potential for improved hand strength and control with physical therapy despite the presence of paresthesia. Upon exam, patient was very tender to palpation at suboccipital region which can be a source of cervicogenic contribution to headache or migraine trigger.  Patient presents with significant pain, ROM, joint stiffness, muscle tension, paresthesia, motor control, muscle performance (strength/power/endurance) and activity tolerance impairments that are limiting ability  to complete her usual activities such as those that require use B UE, activities with fine motor control, sorting mail/paperwork, crochet, crafting, working, activities with awkward or prolonged postures without difficulty. Patient will benefit from skilled physical therapy intervention to address current body structure impairments and activity limitations to improve function and work towards goals set in current POC in order to return to prior level of function or maximal functional improvement.   OBJECTIVE IMPAIRMENTS: decreased activity tolerance, decreased coordination, decreased endurance, decreased knowledge of condition, decreased ROM, decreased strength, impaired perceived functional ability, increased muscle spasms, impaired flexibility, impaired sensation, impaired UE functional use, and pain.   ACTIVITY LIMITATIONS: hygiene/grooming and   difficulty with activities that require use B UE, activities with fine motor control, sorting mail/paperwork, crochet, crafting, working, activities with awkward or prolonged postures  PARTICIPATION LIMITATIONS: meal prep, cleaning, interpersonal relationship, and occupation  PERSONAL FACTORS: Past/current experiences, Profession, Time since onset of injury/illness/exacerbation, and 3+ comorbidities:   Anxiety state; Breast mass, left; Situational mixed anxiety and depressive disorder; Varicose veins of lower extremity; Invasive carcinoma of breast (2022);  Malignant neoplasm of left breast in female, estrogen receptor positive (HCC); 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   REHAB POTENTIAL: Good  CLINICAL DECISION MAKING: Evolving/moderate complexity  EVALUATION COMPLEXITY: Moderate    GOALS: Goals reviewed with patient? Yes  SHORT TERM GOALS: Target date: 04/27/2023  Patient will be  independent with initial home exercise program for self-management of symptoms. Baseline: Initial HEP provided at IE (04/13/23); Goal status: MET   LONG TERM GOALS: Target date: 07/06/2023  Patient will be independent with a long-term home exercise program for self-management of symptoms.  Baseline: Initial HEP provided at IE (04/13/23); currently participating well (05/17/2023);  Goal status: In-progress  2.  Patient will demonstrate improved FOTO  to equal or greater than 69 by visit #10 to demonstrate improvement in overall condition and self-reported functional ability.  Baseline: 59 (04/13/23); 59 at visit #10 (05/17/2023);  Goal status: In-progress  3.  Patient will demonstrate cervical spine rotation equal or greater than 60 degrees bilaterally without increase in pain to improve her ability to check blind spot while driving.  Baseline: R/L 55/54 degrees (04/13/23); R/L 72/72 degrees (05/17/2023);  Goal status: MET  4.  Patient will demonstrate improved grip strength by 5lbs each in both UE to demonstrate improvement in B UE strength and ability to use hands for manual tasks such as working.  Baseline: to be measured visit 2 as appropriate (04/13/23); met - see objective (05/17/2023);  Goal status: MET   5.  Patient will complete community, work and/or recreational activities with 50% less limitation due to current condition.  Baseline: difficulty with activities that require use B UE, activities with fine motor control, sorting mail/paperwork, crochet, crafting, working, activities with awkward or prolonged postures (04/13/23); estimates 15-20% improvement (05/17/2023);  Goal status: In-progress   PLAN:  PT FREQUENCY: 1-2x/week  PT DURATION: 12 weeks  PLANNED INTERVENTIONS: Therapeutic exercises, Therapeutic activity, Neuromuscular re-education, Patient/Family education, Self Care, Joint mobilization, Dry Needling, Electrical stimulation, Spinal mobilization, Cryotherapy, Moist  heat, Manual therapy, and Re-evaluation  PLAN FOR NEXT SESSION: progressive neck/posture/UE/functional strengthening, motor control, and ROM exercises as tolerated. Education. Manual therapy and dry needling as needed. Neurodynamics as appropriate.    Cira Rue, PT, DPT 05/17/2023, 7:44 PM  [facility] [address] [place], [place] [address] P: [phone] I F: [phone]

## 2023-05-18 ENCOUNTER — Encounter: Payer: Commercial Managed Care - HMO | Admitting: Physical Therapy

## 2023-05-19 ENCOUNTER — Encounter: Payer: Self-pay | Admitting: Physical Therapy

## 2023-05-19 ENCOUNTER — Ambulatory Visit: Payer: Commercial Managed Care - HMO | Admitting: Physical Therapy

## 2023-05-19 DIAGNOSIS — M542 Cervicalgia: Secondary | ICD-10-CM | POA: Diagnosis not present

## 2023-05-19 DIAGNOSIS — M792 Neuralgia and neuritis, unspecified: Secondary | ICD-10-CM

## 2023-05-19 DIAGNOSIS — R519 Headache, unspecified: Secondary | ICD-10-CM

## 2023-05-19 DIAGNOSIS — M6281 Muscle weakness (generalized): Secondary | ICD-10-CM

## 2023-05-19 NOTE — Therapy (Signed)
OUTPATIENT PHYSICAL THERAPY TREATMENT   Patient Name: Chanetta Spehar MRN: [id number] DOB:[birth year], 32 y.o., female Today's Date: 05/19/2023  END OF SESSION:  PT End of Session - 05/19/23 1737     Visit Number 11    Number of Visits 23    Date for PT Re-Evaluation 07/06/23    Authorization Type [employer] reporting period from 05/17/2023    Progress Note Due on Visit 20    PT Start Time 1737    PT Stop Time 1815    PT Time Calculation (min) 38 min    Activity Tolerance Patient tolerated treatment well    Behavior During Therapy [facility] for tasks assessed/performed              Past Medical History:  Diagnosis Date   Anxiety    Asthma    Breast cancer (HCC) 11/2020   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 AXILLARY NODE DISSECTION;  Surgeon: Carolan Shiver, MD;  Location: [facility];  Service: General;  Laterality: Bilateral;  [place], 1.5 hours C[place]-Diaz 2.5 hours   BREAST BIOPSY Left 11/26/2020   vision 12:00 6cmfn [facility]   BREAST BIOPSY Left 11/26/2020   LN bx, hydro marker,  fragments of macrometastatic carcinoma   BREAST RECONSTRUCTION WITH PLACEMENT OF TISSUE EXPANDER AND FLEX HD (ACELLULAR HYDRATED DERMIS) Bilateral 06/25/2021   Procedure: BREAST RECONSTRUCTION WITH PLACEMENT OF TISSUE EXPANDER AND FLEX HD (ACELLULAR HYDRATED DERMIS);  Surgeon: Peggye Form, DO;  Location: [facility];  Service: Plastics;  Laterality: Bilateral;   PORTACATH PLACEMENT Right 12/13/2020   Procedure: INSERTION PORT-A-CATH;  Surgeon: Carolan Shiver, MD;  Location: [facility];  Service: General;  Laterality: Right;    REMOVAL OF BILATERAL TISSUE EXPANDERS WITH PLACEMENT OF BILATERAL BREAST IMPLANTS Bilateral 09/15/2021   Procedure: REMOVAL OF BILATERAL TISSUE EXPANDERS;  Surgeon: Peggye Form, DO;  Location: [facility];  Service: Plastics;  Laterality: Bilateral;   Patient Active Problem List   Diagnosis Date Noted   Chemotherapy-induced neuropathy (HCC) 02/06/2022   Right sided weakness 02/06/2022   Transaminitis 11/20/2021   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 10/25/2021   Malignant neoplasm of left breast in female, estrogen receptor positive (HCC) 06/25/2021   Genetic testing 01/15/2021   Family history of cancer    Invasive carcinoma of breast (HCC) 12/03/2020   Breast mass, left 07/01/2020   Situational mixed anxiety and depressive disorder 07/01/2020   Anxiety state 08/06/2014   Varicose veins of lower extremity 08/06/2014    PCP: Evelena Peat, PA  REFERRING PROVIDER: Evelena Peat, [place]  REFERRING DIAG: migraine without aura and without status migrainosus, not intractable   THERAPY DIAG:  Cervicalgia  Neuralgia and neuritis  Muscle weakness (generalized)  Nonintractable headache, unspecified chronicity pattern, unspecified headache type  Rationale for Evaluation and Treatment: Rehabilitation  ONSET DATE: neck pain chronic over many years, hand paresthesia onset with cancer treatment in 2022  PERTINENT HISTORY:  Patient is a 32 y.o. female who presents to outpatient physical therapy with a referral for medical diagnosis migraine without aura and without status migrainosus, not intractable. This patient's chief complaints  consist of chronic neck pain (R > L) and migriane headaches and bilateral hand/forearm paresthesia (R > L, C8/ulnar nerve distribution worse) leading to the following functional deficits: difficulty with activities that require use B UE, activities with fine motor control, sorting mail/paperwork, crochet, crafting,  working, activities with awkward or prolonged postures. 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  Patient denies hx of stroke, seizures, lung problems, heart problems, diabetes, unexplained weight loss, unexplained changes in bowel or bladder problems, osteoporosis, and spinal surgery.   SUBJECTIVE:                                                                                                                                                                                                         SUBJECTIVE STATEMENT: Patient states her whole body hurts after her workplace stopped taking it easy on her. She walked for 2 hours today. Her right hip is sore from leaning sideways to reach the mail repeatedly. Patient states she was a little sore after last PT session. She is happy with what she is doing at PT and at work. She felt different doing the retraction with self overpressure at home and would like to to review it today.   PAIN:  NPRS: 5/10 over bilateral upper traps and at base of scull, mostly when she is tilting her head side to side.   PATIENT GOALS: Learning how to manage symptoms or manage them better. Strength. To get better. To control it so it doesn't get any worse.    TODAY'S TREATMENT:   Therapeutic exercise: to centralize symptoms and improve ROM, strength, muscular endurance, and activity tolerance required for successful completion of functional activities.  - Upper body ergometer level 7 to encourage joint nutrition, warm tissue, induce analgesic effect of aerobic exercise, improve  muscular strength and endurance,  and prepare for remainder of session. 5 min.  - seated with towel roll, cervical retraction: 2x10 with self OP (improved comfort with ROM) - seated with towel roll, cervical retraction with OP and extension, 2x10 (starting rotation with last 4 reps of first set and continuing into 2nd set).   Manual therapy: to reduce pain and tissue tension, improve range of motion, neuromodulation, in order to promote improved ability to complete functional activities. PRONE  with face in cradle and pillow under ankles - STM to bilateral upper traps  Modality: Dry needling performed to B UT to decrease pain and spasms along patient's neck and head region with patient in prone utilizing 2 dry needle(s) .25mm x 40mm with 2 sticks at left UT and 3 sticks at left UT. Patient educated about the risks and benefits from therapy and verbally consents to treatment. Strong twitch response noted in R>L UT.  Dry needling performed by Luretha Murphy. Ilsa Iha PT, DPT who is certified in this technique.   Pt required multimodal cuing for proper technique and to facilitate improved neuromuscular control, strength, range of motion, and functional ability resulting in improved performance and form.  PATIENT EDUCATION:  Education details: Exercise purpose/form. Self management techniques. Education on diagnosis, prognosis, POC, anatomy and physiology of current condition Education on HEP including handout  Person educated: Patient Education method: Explanation, Demonstration, Tactile cues, Verbal cues, and Handouts Education comprehension: verbalized understanding, returned demonstration, and needs further education  HOME EXERCISE PROGRAM: Access Code: [email or web address] URL: [email or web address] Date: 05/06/2023 Prepared by: Norton Blizzard  Exercises - Standing Bilateral Low Shoulder Row with Anchored Resistance  - 1 x daily - 3 sets - 10 reps - Sidelying Thoracic Rotation with Open Book  - 1 x  daily - 1-2 sets - 10 reps - 5 seconds/1 breath hold - Prone W Scapular Retraction  - 3-5 x weekly - 3 sets - 10 reps - Neutral Curl Up with Arms Across Chest  - 1 x daily - 3-5 x weekly - 5 sets - 30 seconds hold   HOME EXERCISE PROGRAM [[id number]] View at [email or web address]" using code: [id number] Cervical Retraction in Sitting w/OP - MDT -  Repeat 20 Repetitions, Hold 1 Second(s), Complete 1 Set, Perform 8 Times a Day  ASSESSMENT:  CLINICAL IMPRESSION: Patient arrives feeling sore and tired from increased workload. She reports continued pain and tightness in her neck and did not feel she was able to her retraction exercises as well as in the clinic. Reviewed retraction with overpressure and progressed to retraction to extension with rotation with improvement in ROM and pain noted again in clinic. Patient had strong twitch response with increased soreness in B UT after dry needling. Patient would benefit from continued management of limiting condition by skilled physical therapist to address remaining impairments and functional limitations to work towards stated goals and return to PLOF or maximal functional independence.   From Initial PT Evaluation 04/13/2023:  Patient is a 32 y.o. female referred to outpatient physical therapy with a medical diagnosis of migraine without aura and without status migrainosus, not intractable who presents with signs and symptoms consistent with chronic neck pain and intermittent migraines bilateral hand and forearm paresthesia in glove pattern with worst symptoms at 5th digit. Patient's hand symptoms continue to seem best explained as chemotherapy-induced peripheral neuropathy which is often unable to be detected on nerve conduction testing despite being clinically present due to the fine nerves being affected, while nerve conduction studies are only able to measure changes in large myelinated nerve fibers. Unable to make connection between neck and hand symptoms during  exam. Will continue to be alert for signs of radiculopathy and address accordingly. Patient has potential for improved hand strength and control with physical therapy despite the presence of paresthesia. Upon exam, patient was very tender to palpation at suboccipital region which can be a source of cervicogenic contribution to headache or migraine trigger.  Patient presents with significant pain, ROM, joint  stiffness, muscle tension, paresthesia, motor control, muscle performance (strength/power/endurance) and activity tolerance impairments that are limiting ability to complete her usual activities such as those that require use B UE, activities with fine motor control, sorting mail/paperwork, crochet, crafting, working, activities with awkward or prolonged postures without difficulty. Patient will benefit from skilled physical therapy intervention to address current body structure impairments and activity limitations to improve function and work towards goals set in current POC in order to return to prior level of function or maximal functional improvement.   OBJECTIVE IMPAIRMENTS: decreased activity tolerance, decreased coordination, decreased endurance, decreased knowledge of condition, decreased ROM, decreased strength, impaired perceived functional ability, increased muscle spasms, impaired flexibility, impaired sensation, impaired UE functional use, and pain.   ACTIVITY LIMITATIONS: hygiene/grooming and   difficulty with activities that require use B UE, activities with fine motor control, sorting mail/paperwork, crochet, crafting, working, activities with awkward or prolonged postures  PARTICIPATION LIMITATIONS: meal prep, cleaning, interpersonal relationship, and occupation  PERSONAL FACTORS: Past/current experiences, Profession, Time since onset of injury/illness/exacerbation, and 3+ comorbidities:   Anxiety state; Breast mass, left; Situational mixed anxiety and depressive disorder; Varicose veins  of lower extremity; Invasive carcinoma of breast (2022);  Malignant neoplasm of left breast in female, estrogen receptor positive (HCC); 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   REHAB POTENTIAL: Good  CLINICAL DECISION MAKING: Evolving/moderate complexity  EVALUATION COMPLEXITY: Moderate    GOALS: Goals reviewed with patient? Yes  SHORT TERM GOALS: Target date: 04/27/2023  Patient will be independent with initial home exercise program for self-management of symptoms. Baseline: Initial HEP provided at IE (04/13/23); Goal status: MET   LONG TERM GOALS: Target date: 07/06/2023  Patient will be independent with a long-term home exercise program for self-management of symptoms.  Baseline: Initial HEP provided at IE (04/13/23); currently participating well (05/17/2023);  Goal status: In-progress  2.  Patient will demonstrate improved FOTO to equal or greater than 69 by visit #10 to demonstrate improvement in overall condition and self-reported functional ability.  Baseline: 59 (04/13/23); 59 at visit #10 (05/17/2023);  Goal status: In-progress  3.  Patient will demonstrate cervical spine rotation equal or greater than 60 degrees bilaterally without increase in pain to improve her ability to check blind spot while driving.  Baseline: R/L 55/54 degrees (04/13/23); R/L 72/72 degrees (05/17/2023);  Goal status: MET  4.  Patient will demonstrate improved grip strength by 5lbs each in both UE to demonstrate improvement in B UE strength and ability to use hands for manual tasks such as working.  Baseline: to be measured visit 2 as appropriate (04/13/23); met - see objective (05/17/2023);  Goal status: MET   5.  Patient will complete community, work and/or recreational activities with 50% less limitation due to current condition.   Baseline: difficulty with activities that require use B UE, activities with fine motor control, sorting mail/paperwork, crochet, crafting, working, activities with awkward or prolonged postures (04/13/23); estimates 15-20% improvement (05/17/2023);  Goal status: In-progress   PLAN:  PT FREQUENCY: 1-2x/week  PT DURATION: 12 weeks  PLANNED INTERVENTIONS: Therapeutic exercises, Therapeutic activity, Neuromuscular re-education, Patient/Family education, Self Care, Joint mobilization, Dry Needling, Electrical stimulation, Spinal mobilization, Cryotherapy, Moist heat, Manual therapy, and Re-evaluation  PLAN FOR NEXT SESSION: progressive neck/posture/UE/functional strengthening, motor control, and ROM exercises as tolerated. Education. Manual therapy and dry needling as needed. Neurodynamics as appropriate.    Cira Rue, PT,  DPT 05/19/2023, 9:41 PM  [facility] [address] [place], [place] [address] P: [phone] I F: [phone]

## 2023-05-20 ENCOUNTER — Encounter: Payer: Self-pay | Admitting: Occupational Therapy

## 2023-05-20 ENCOUNTER — Ambulatory Visit: Payer: Commercial Managed Care - HMO | Admitting: Occupational Therapy

## 2023-05-20 ENCOUNTER — Inpatient Hospital Stay: Payer: Commercial Managed Care - HMO

## 2023-05-20 DIAGNOSIS — Z853 Personal history of malignant neoplasm of breast: Secondary | ICD-10-CM | POA: Diagnosis not present

## 2023-05-20 DIAGNOSIS — I972 Postmastectomy lymphedema syndrome: Secondary | ICD-10-CM

## 2023-05-20 DIAGNOSIS — R7401 Elevation of levels of liver transaminase levels: Secondary | ICD-10-CM

## 2023-05-20 DIAGNOSIS — M542 Cervicalgia: Secondary | ICD-10-CM | POA: Diagnosis not present

## 2023-05-20 DIAGNOSIS — C50412 Malignant neoplasm of upper-outer quadrant of left female breast: Secondary | ICD-10-CM

## 2023-05-20 LAB — COMPREHENSIVE METABOLIC PANEL
ALT: 44 U/L (ref 0–44)
AST: 39 U/L (ref 15–41)
Albumin: 4.8 g/dL (ref 3.5–5.0)
Alkaline Phosphatase: 78 U/L (ref 38–126)
Anion gap: 7 (ref 5–15)
BUN: 11 mg/dL (ref 6–20)
CO2: 23 mmol/L (ref 22–32)
Calcium: 9.1 mg/dL (ref 8.9–10.3)
Chloride: 105 mmol/L (ref 98–111)
Creatinine, Ser: 0.68 mg/dL (ref 0.44–1.00)
GFR, Estimated: >60 mL/min (ref >60)
Glucose, Bld: 109 mg/dL — ABNORMAL HIGH (ref 70–99)
Potassium: 3.8 mmol/L (ref 3.5–5.1)
Sodium: 135 mmol/L (ref 135–145)
Total Bilirubin: 0.5 mg/dL (ref 0.3–1.2)
Total Protein: 7.8 g/dL (ref 6.5–8.1)

## 2023-05-20 LAB — CBC WITH DIFFERENTIAL/PLATELET
Abs Immature Granulocytes: 0.05 10*3/uL (ref 0.00–0.07)
Basophils Absolute: 0 10*3/uL (ref 0.0–0.1)
Basophils Relative: 0 %
Eosinophils Absolute: 0.1 10*3/uL (ref 0.0–0.5)
Eosinophils Relative: 1 %
HCT: 40.2 % (ref 36.0–46.0)
Hemoglobin: 13.7 g/dL (ref 12.0–15.0)
Immature Granulocytes: 1 %
Lymphocytes Relative: 13 %
Lymphs Abs: 1.2 10*3/uL (ref 0.7–4.0)
MCH: 32 pg (ref 26.0–34.0)
MCHC: 34.1 g/dL (ref 30.0–36.0)
MCV: 93.9 fL (ref 80.0–100.0)
Monocytes Absolute: 0.7 10*3/uL (ref 0.1–1.0)
Monocytes Relative: 8 %
Neutro Abs: 7.1 10*3/uL (ref 1.7–7.7)
Neutrophils Relative %: 77 %
Platelets: 218 10*3/uL (ref 150–400)
RBC: 4.28 MIL/uL (ref 3.87–5.11)
RDW: 12.8 % (ref 11.5–15.5)
WBC: 9 10*3/uL (ref 4.0–10.5)
nRBC: 0 % (ref 0.0–0.2)

## 2023-05-20 MED ORDER — HEPARIN SOD (PORK) LOCK FLUSH 100 UNIT/ML IV SOLN
500.0000 [IU] | Freq: Once | INTRAVENOUS | Status: AC
  Administered 2023-05-20: 500 [IU] via INTRAVENOUS
  Filled 2023-05-20: qty 5

## 2023-05-20 MED ORDER — SODIUM CHLORIDE 0.9% FLUSH
10.0000 mL | INTRAVENOUS | Status: DC | PRN
  Administered 2023-05-20: 10 mL via INTRAVENOUS
  Filled 2023-05-20: qty 10

## 2023-05-20 NOTE — Therapy (Signed)
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 AXILLARY NODE DISSECTION;  Surgeon: Carolan Shiver, MD;  Location: [facility];  Service: General;  Laterality: Bilateral;  [place], 1.5 hours Cintron-Diaz 2.5 hours   BREAST BIOPSY Left 11/26/2020   vision 12:00 6cmfn [facility]   BREAST BIOPSY Left 11/26/2020   LN bx, hydro marker,  fragments of macrometastatic carcinoma   BREAST RECONSTRUCTION WITH PLACEMENT OF TISSUE EXPANDER AND FLEX HD (ACELLULAR HYDRATED DERMIS) Bilateral 06/25/2021   Procedure: BREAST RECONSTRUCTION WITH PLACEMENT OF TISSUE EXPANDER AND FLEX HD (ACELLULAR HYDRATED DERMIS);  Surgeon: Peggye Form, DO;  Location: [facility];  Service: Plastics;  Laterality: Bilateral;   PORTACATH PLACEMENT Right 12/13/2020   Procedure: INSERTION PORT-A-CATH;  Surgeon: Carolan Shiver, MD;  Location: [facility];  Service: General;  Laterality: Right;   REMOVAL OF BILATERAL TISSUE EXPANDERS WITH PLACEMENT OF BILATERAL BREAST  IMPLANTS Bilateral 09/15/2021   Procedure: REMOVAL OF BILATERAL TISSUE EXPANDERS;  Surgeon: Peggye Form, DO;  Location: [facility];  Service: Plastics;  Laterality: Bilateral;   Patient Active Problem List   Diagnosis Date Noted   Chemotherapy-induced neuropathy (HCC) 02/06/2022   Right sided weakness 02/06/2022   Transaminitis 11/20/2021   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 10/25/2021   Malignant neoplasm of left breast in female, estrogen receptor positive (HCC) 06/25/2021   Genetic testing 01/15/2021   Family history of cancer    Invasive carcinoma of breast (HCC) 12/03/2020   Breast mass, left 07/01/2020   Situational mixed anxiety and depressive disorder 07/01/2020   Anxiety state 08/06/2014   Varicose veins of lower extremity 08/06/2014    PCP: Dorothey Baseman, MD   REFERRING PROVIDER: Creig Hines, MD  REFERRING DIAG: 197.2  THERAPY DIAG: Post-mastectomy lymphedema syndrome  ONSET DATE: 9/22  SUBJECTIVE  SUBJECTIVE STATEMENT: Vickie Mathis presents to OT to address LUE/LUQ post-mastectomy lymphedema and post-mastectomy pain syndrome of LUQ. Koralie returns for 3 month follow up today. She was last seen for lymphedema care on 02/17/23.  Pt reports discomfort, tightness, itching in LUQ at L chest wall and lateral trunk "is always there". She reports PT is helping to reduce pain and discomfort in her  shoulders and neck. She does not rate pain numerically. 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   PERTINENT HISTORY: 2021 self palpated L breast mass. Diagnostic mammogram and ultrasound revealed 2.7 cm mass at 12 o'clock 6 cm from the  nipple. L axillary ultrasound revealed 2 suspicious LN. Both breast mass and LN + for invasive mammary carcinoma grade 3, ER/PR and her 2 negative.R breast non-mass enhancement was negative for Br Ca.Pt underwent neoadjuvant chemotherapy. 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 She underwent bilateral mastectomy with reconstruction in 9/22.Pathology revealed complete pathological response with 2 LN negative and R breast negative for malignancy. Breast implants removed 2/2 staff infection. Anxiety, chemo-induced neuropathy, intermittent L chest wall pain,   IMPAIRMENTS: chronic pain and L chest wall and trunk swelling ( post-mastectomy pain syndrome), extending across L  surgical site, through axilla and across lateral trunk, to L scapula since  bilateral mastectomy and ALND. 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 Impaired body image, disturbed sleep,  OTHER OBSTACLES TO OT LE CARE: limited transportation, limited financial resources ,  limited emotional support from spouse  PAIN:  Are you having pain? Yes NPRS scale: not rated/10 Pain location: L lateral trunk and chest wall at mastectomy site,  PAIN TYPE: chronic Pain description: intermittent , sore, tender, tight numb, itchy Aggravating factors: lifting, carrying, pushing, pulling, reaching overhead Relieving factors: unknown   PRECAUTIONS: Other: lymphedema precautions   PATIENT GOALS: relieve post-mastectomy pain and limit LE progression   OBJECTIVE BLE COMPARATIVE LIMB VOLUMETRICS:   09/02/22 INITIAL LUE  LANDMARK LEFT  09/02/22  L MPs 20.5 cm  RUE (excluding hand)  2910.9 ml     Limb Volume differential (LVD)   %  Volume change since initial %  Volume change overall %  (Blank rows = not tested) :    LUE COMPARATIVE LIMB VOLUMETRICS:   09/02/22 10 th visit  LANDMARK LEFT  10/15/12   L MPs 20.5 cm  RUE (excluding hand)  2910.9 ml     Limb Volume differential (LVD)  %  Volume change since initial LUE volume Increase 8.3 % since initially measured on 09/02/22  Volume change overall %  (Blank rows = not tested) :   LUE COMPARATIVE LIMB VOLUMETRICS:   12/02/22 19 th visit  LANDMARK LUE (19 th visit)  L MPs 20.4 cm  RUE (excluding hand)  3150.6 ml     Limb Volume differential (LVD)    Volume change since 10 th visit INC 0.25%  Volume change since initial INC 8.23%  (Blank rows = not tested) :   BLE COMPARATIVE LIMB VOLUMETRICS:   02/10/23 FINAL    LEFT Rx limb LANDMARK LEFT  02/10/23  L MPs 20.5 cm  L UE (excluding hand)  3084.0  ml     Limb Volume differential (LVD)  0.4%, L>R (WNL)  Volume change since last  12/02/22 DEC 2.1%  Volume change overall INC 5.9 %  (Blank rows = not tested) :     RIGHT (dominant) LANDMARK RIGHT 02/10/23  R MPs 20.5 cm  RUE (excluding hand)  3096.3 ml     Limb Volume differential (LVD)  %  Volume change since initial %  Volume change overall %  (Blank rows = not tested) :   BLE COMPARATIVE LIMB VOLUMETRICS:   02/10/23 FINAL   LEFT Rx limb LANDMARK LEFT  02/10/23  L MPs 20.2 cm  L UE (excluding hand)  2982.2  ml     Limb Volume differential (LVD)  0.4%, L>R (WNL)  Volume change since last  12/02/22 DEC 3.33% since last measured on 02/17/23.  Volume change overall   (Blank rows = not tested) :    PATIENT SURVEYS:    LYMPHEDEMA LIFE IMPACT SCALE: 08/24/22 INTAKE 20.59%           02/17/23    OUT TAKE 1.47% results in reduction of 19.12 %     (The extent to which lymphedema-related problems affected your life last week)  Intake score: 20.59%    TODAY'S TREATMENT:                                                                                                                                         LUE comparative limb volumetrics MLD  to LUE/LUQ w emphasis on L lateral trunk and axilla Skin care throughout MLD to assist w skin excursion and hydration.   Reviewed LE self care home program: MLD, compression garment at work and when flying, cont strengthening for return to full time job, and cont skin care as instructed.  PATIENT EDUCATION: Continued Pt/ CG edu for lymphedema self care home program throughout session. Topics include outcome of comparative limb volumetrics- starting limb volume differentials (LVDs), technology and gradient techniques used for short stretch, multilayer compression wrapping, simple self-MLD, therapeutic lymphatic pumping exercises, skin/nail care, LE precautions,. compression garment recommendations and specifications, wear and care schedule and compression garment donning / doffing w assistive devices. Discussed progress towards all OT goals since commencing CDT. All questions answered to the Pt's satisfaction. Good return.  Person educated: Patient Education method: Handouts, demonstration Education comprehension: verbalized understanding, returned demonstration, and needs further education  HOME EXERCISE PROGRAM: 1.Therapeutic lymphatic pumping therex- 2 sets of 10 reps, hold 5 seconds; elements in order. 2 x daily PRN 2. Resistant bands (Red and Green)  - for upper and lower extremity strengthening. 3.  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 Belisse compression bra ( 39  a/b) and a JoviPak double mastectomy pad for HOS to reduce fibrosis and facilitate improved lymphatic function to limit progression. 4. Daily skin care to sustain optimal hydration. Wash bites, scratches, blisters, etc with mild soap and water , apply antibacterial first aide cream and a band aide. 5. Simple self-Manual  lymphatic drainage (MLD) PRN.  ASSESSMENT:   CLINICAL IMPRESSION:   LUE/LUQ lymphedema is stable with volumetrics revealing an additional 3.3% loss in limb volume since last measured on 02/17/24. 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 Pt managed well this summer using tools and strategies learned in therapy, despite extremely hot summer weather. Pt continues to have sensory discomfort in L chest wall, axilla and lateral trunk, which she describes as tightness, pulling and itching". Occupational Therapy services for lymphedema care are no longer indicated. Pt agrees to call with any questions , or concerns in the future. It has been a pleasure working with Ms. Simson.  OBJECTIVE IMPAIRMENTS: decreased knowledge of lymphedema ;  decreased knowledge of use of DME, decreased ROM, increased edema, impaired sensation, and pain with onset in   ACTIVITY LIMITATIONS: carrying, lifting, bed mobility, bathing, dressing, reach over head, and hygiene/grooming  PARTICIPATION LIMITATIONS: occupation  PERSONAL FACTORS:  Hx comorbid anxiety  is also affecting patient's functional outcome.   REHAB POTENTIAL: Good  CLINICAL DECISION MAKING: Stable/uncomplicated   GOALS: Goals reviewed with patient? Yes  LONG TERM GOALS: Target date: 11/22/21    Final assessment 02/17/23: All goals met for OT lymphedema care     Given this patient's Intake score of 20.59/100% on the Lymphedema Life Impact Scale (LLIS), patient will experience a reduction of at least 5% in her perceived level of functional impairment resulting from lymphedema to improve functional performance and quality of life (QOL). Baseline: 20.59%  Goal status: 10/07/22 PROGRESSING; 12/08/22 Progressing; 02/10/23 GOAL MET w 7.35% reduction 02/17/23 final = 1. 47%. Overall reduction on LLIS measures 19.12%   2.  Given this patient's Intake score of TBA/100% on the  functional outcomes FOTO tool, patient will experience an increase in function of 5 points  to improve basic and instrumental ADLs performance, including lymphedema self-care. Baseline:  TBA Goal status: DEFERRED  3.   Pt will demonstrate understanding of lymphedema precautions and prevention strategies with modified independence using a printed reference to identify at least 5 precautions and discussing how s/he may implement them into daily life to reduce risk of progression and to limit infection risk. Baseline: Max A Goal status:10/07/22 GOAL MET  4.  Pt will be able to don and doff prophylactic compression arm sleeve using assistive devices and extra time (modified independent), and discuss wear and garment care recommendations  to limit lymphedema by issue date. Baseline: dependent Goal status: 09/15/22 GOAL MET  5.  Pt will demonstrate the ability to perform all lymphedema self-care home program components with printed reference sheets (modified independence) by DC to limit lymphedema progression Baseline: dependent Goal status: 12/09/22 PROGRESSING. Awaiting approval of advanced Flexitouch Plus  02/10/23 GOAL MET  6.  Pt will report 0/10 shoulder pain with AROM in all planes at end ranges bilaterally against gravity by DC for optimal functional performance of basic and instrumental ADLs, work and productive activities, leisure pursuits and social participation, Baseline: 2-3/10 Goal status: 12/29/22 ONGOING 02/10/23 GOAL MET  PLAN:  OT FREQUENCY: DC OT this date for lymphedema care.  OT DURATION: PRN  PLAN FOR NEXT SESSION: Ongoing  support PRN for lymphedema self management over time at home  Loel Dubonnet, MS, OTR/L, CLT-LANA 05/20/23 11:25 AM

## 2023-05-25 ENCOUNTER — Encounter: Payer: Self-pay | Admitting: Physical Therapy

## 2023-05-25 ENCOUNTER — Ambulatory Visit: Payer: Commercial Managed Care - HMO | Admitting: Physical Therapy

## 2023-05-25 DIAGNOSIS — M792 Neuralgia and neuritis, unspecified: Secondary | ICD-10-CM

## 2023-05-25 DIAGNOSIS — M6281 Muscle weakness (generalized): Secondary | ICD-10-CM

## 2023-05-25 DIAGNOSIS — M542 Cervicalgia: Secondary | ICD-10-CM

## 2023-05-25 DIAGNOSIS — R519 Headache, unspecified: Secondary | ICD-10-CM

## 2023-05-25 NOTE — Therapy (Signed)
OUTPATIENT PHYSICAL THERAPY TREATMENT   Patient Name: Vickie Mathis MRN: [id number] DOB:Nov 29, 1990, 32 y.o., female Today's Date: 05/25/2023  END OF SESSION:  PT End of Session - 05/25/23 1450     Visit Number 12    Number of Visits 23    Date for PT Re-Evaluation 07/06/23    Authorization Type [employer] reporting period from 05/17/2023    Progress Note Due on Visit 20    PT Start Time 1437    PT Stop Time 1520    PT Time Calculation (min) 43 min    Activity Tolerance Patient tolerated treatment well    Behavior During Therapy [facility] for tasks assessed/performed               Past Medical History:  Diagnosis Date   Anxiety    Asthma    Breast cancer (HCC) 11/2020   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 AXILLARY NODE DISSECTION;  Surgeon: Carolan Shiver, MD;  Location: [facility];  Service: General;  Laterality: Bilateral;  [place], 1.5 hours Cintron-Diaz 2.5 hours   BREAST BIOPSY Left 11/26/2020   vision 12:00 6cmfn [facility]   BREAST BIOPSY Left 11/26/2020   LN bx, hydro marker,  fragments of macrometastatic carcinoma   BREAST RECONSTRUCTION WITH PLACEMENT OF TISSUE EXPANDER AND FLEX HD (ACELLULAR HYDRATED DERMIS) Bilateral 06/25/2021   Procedure: BREAST RECONSTRUCTION WITH PLACEMENT OF TISSUE EXPANDER AND FLEX HD (ACELLULAR HYDRATED DERMIS);  Surgeon: Peggye Form, DO;  Location: [facility];  Service: Plastics;  Laterality: Bilateral;   PORTACATH PLACEMENT Right 12/13/2020   Procedure: INSERTION PORT-A-CATH;  Surgeon: Carolan Shiver, MD;  Location: [facility];  Service: General;  Laterality: Right;    REMOVAL OF BILATERAL TISSUE EXPANDERS WITH PLACEMENT OF BILATERAL BREAST IMPLANTS Bilateral 09/15/2021   Procedure: REMOVAL OF BILATERAL TISSUE EXPANDERS;  Surgeon: Peggye Form, DO;  Location: [facility];  Service: Plastics;  Laterality: Bilateral;   Patient Active Problem List   Diagnosis Date Noted   Chemotherapy-induced neuropathy (HCC) 02/06/2022   Right sided weakness 02/06/2022   Transaminitis 11/20/2021   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 10/25/2021   Malignant neoplasm of left breast in female, estrogen receptor positive (HCC) 06/25/2021   Genetic testing 01/15/2021   Family history of cancer    Invasive carcinoma of breast (HCC) 12/03/2020   Breast mass, left 07/01/2020   Situational mixed anxiety and depressive disorder 07/01/2020   Anxiety state 08/06/2014   Varicose veins of lower extremity 08/06/2014    PCP: Evelena Peat, PA  REFERRING PROVIDER: Evelena Peat, [place]  REFERRING DIAG: migraine without aura and without status migrainosus, not intractable   THERAPY DIAG:  Cervicalgia  Neuralgia and neuritis  Muscle weakness (generalized)  Nonintractable headache, unspecified chronicity pattern, unspecified headache type  Rationale for Evaluation and Treatment: Rehabilitation  ONSET DATE: neck pain chronic over many years, hand paresthesia onset with cancer treatment in 2022  PERTINENT HISTORY:  Patient is a 32 y.o. female who presents to outpatient physical therapy with a referral for medical diagnosis migraine without aura and without status migrainosus, not intractable. This patient's chief  complaints consist of chronic neck pain (R > L) and migriane headaches and bilateral hand/forearm paresthesia (R > L, C8/ulnar nerve distribution worse) leading to the following functional deficits: difficulty with activities that require use B UE, activities with fine motor control, sorting mail/paperwork, crochet, crafting,  working, activities with awkward or prolonged postures. 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  Patient denies hx of stroke, seizures, lung problems, heart problems, diabetes, unexplained weight loss, unexplained changes in bowel or bladder problems, osteoporosis, and spinal surgery.   SUBJECTIVE:                                                                                                                                                                                                         SUBJECTIVE STATEMENT: Patient states her body is feeling a little better after a day off from work and a light day at work. She continues to have neck pain but she felt dry needling helped her last time. She just got out of work prior to her PT session today. She has been doing her chin retractions.   PAIN:  NPRS: 3-4/10 over bilateral upper traps and at base of scull, mostly when she is tilting her head side to side.   PATIENT GOALS: Learning how to manage symptoms or manage them better. Strength. To get better. To control it so it doesn't get any worse.    TODAY'S TREATMENT:   Therapeutic exercise: to centralize symptoms and improve ROM, strength, muscular endurance, and activity tolerance required for successful completion of functional activities.  - Upper body ergometer level 7 to encourage joint nutrition, warm tissue, induce analgesic effect of aerobic exercise, improve muscular strength and endurance,  and prepare for remainder of session. 5 min.  - seated with towel roll, cervical retraction: 2x10 with  self OP (improved comfort with ROM).  - seated with towel roll, cervical retraction with OP and extension and rotation, 2x10  (Improved ROM and comfort) (Manual therapy / dry needling - see below).  - prone cervical retraction hold with scapuar retraction to W <> Y and down, 3x10.   Manual therapy: to reduce pain and tissue tension, improve range of motion, neuromodulation, in order to promote improved ability to complete functional activities. PRONE with face in  cradle and pillow under ankles - STM to bilateral upper traps  Modality: Dry needling performed to B UT to decrease pain and spasms along patient's neck and head region with patient in prone utilizing 2 dry needle(s) .25mm x 40mm with 3 sticks at right  UT and 5 sticks at left UT. Patient educated about the risks and benefits from therapy and verbally consents to treatment. Strong twitch response noted in R>L UT.  Dry needling performed by Luretha Murphy. Ilsa Iha PT, DPT who is certified in this technique.   Pt required multimodal cuing for proper technique and to facilitate improved neuromuscular control, strength, range of motion, and functional ability resulting in improved performance and form.  PATIENT EDUCATION:  Education details: Exercise purpose/form. Self management techniques. Education on diagnosis, prognosis, POC, anatomy and physiology of current condition Education on HEP including handout  Person educated: Patient Education method: Explanation, Demonstration, Tactile cues, Verbal cues, and Handouts Education comprehension: verbalized understanding, returned demonstration, and needs further education  HOME EXERCISE PROGRAM: Access Code: [email or web address] URL: [email or web address] Date: 05/06/2023 Prepared by: Norton Blizzard  Exercises - Standing Bilateral Low Shoulder Row with Anchored Resistance  - 1 x daily - 3 sets - 10 reps - Sidelying Thoracic Rotation with Open Book  - 1 x daily - 1-2 sets - 10 reps - 5  seconds/1 breath hold - Prone W Scapular Retraction  - 3-5 x weekly - 3 sets - 10 reps - Neutral Curl Up with Arms Across Chest  - 1 x daily - 3-5 x weekly - 5 sets - 30 seconds hold   HOME EXERCISE PROGRAM [[id number]] View at [email or web address]" using code: [id number] Cervical Retraction in Sitting w/OP - MDT -  Repeat 20 Repetitions, Hold 1 Second(s), Complete 1 Set, Perform 8 Times a Day  ASSESSMENT:  CLINICAL IMPRESSION: Patient arrives feeling slightly more rested after a day off and a lighter day at work but with continued neck pain. Today's session continued with dry needling and specific exercise for improved pain and ROM. Patient reported increased soreness at the left UT after needling where she had many muscle twitches there. She found prone postural exercises challenging due to fatigue and cramping feeling at lower trap, requiring > 1 min rest between sets. Patient would benefit from continued management of limiting condition by skilled physical therapist to address remaining impairments and functional limitations to work towards stated goals and return to PLOF or maximal functional independence.    From Initial PT Evaluation 04/13/2023:  Patient is a 32 y.o. female referred to outpatient physical therapy with a medical diagnosis of migraine without aura and without status migrainosus, not intractable who presents with signs and symptoms consistent with chronic neck pain and intermittent migraines bilateral hand and forearm paresthesia in glove pattern with worst symptoms at 5th digit. Patient's hand symptoms continue to seem best explained as chemotherapy-induced peripheral neuropathy which is often unable to be detected on nerve conduction testing despite being clinically present due to the fine nerves being affected, while nerve conduction studies are only able to measure changes in large myelinated nerve fibers. Unable to make connection between neck and hand symptoms during exam. Will  continue to be alert for signs of radiculopathy and address accordingly. Patient has potential for improved hand strength and control with physical therapy despite the presence of paresthesia. Upon exam, patient was very tender to palpation at suboccipital region which can be a source of cervicogenic contribution to headache or migraine trigger.  Patient presents with significant pain,  ROM, joint stiffness, muscle tension, paresthesia, motor control, muscle performance (strength/power/endurance) and activity tolerance impairments that are limiting ability to complete her usual activities such as those that require use B UE, activities with fine motor control, sorting mail/paperwork, crochet, crafting, working, activities with awkward or prolonged postures without difficulty. Patient will benefit from skilled physical therapy intervention to address current body structure impairments and activity limitations to improve function and work towards goals set in current POC in order to return to prior level of function or maximal functional improvement.   OBJECTIVE IMPAIRMENTS: decreased activity tolerance, decreased coordination, decreased endurance, decreased knowledge of condition, decreased ROM, decreased strength, impaired perceived functional ability, increased muscle spasms, impaired flexibility, impaired sensation, impaired UE functional use, and pain.   ACTIVITY LIMITATIONS: hygiene/grooming and   difficulty with activities that require use B UE, activities with fine motor control, sorting mail/paperwork, crochet, crafting, working, activities with awkward or prolonged postures  PARTICIPATION LIMITATIONS: meal prep, cleaning, interpersonal relationship, and occupation  PERSONAL FACTORS: Past/current experiences, Profession, Time since onset of injury/illness/exacerbation, and 3+ comorbidities:   Anxiety state; Breast mass, left; Situational mixed anxiety and depressive disorder; Varicose veins of lower  extremity; Invasive carcinoma of breast (2022);  Malignant neoplasm of left breast in female, estrogen receptor positive (HCC); 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   REHAB POTENTIAL: Good  CLINICAL DECISION MAKING: Evolving/moderate complexity  EVALUATION COMPLEXITY: Moderate    GOALS: Goals reviewed with patient? Yes  SHORT TERM GOALS: Target date: 04/27/2023  Patient will be independent with initial home exercise program for self-management of symptoms. Baseline: Initial HEP provided at IE (04/13/23); Goal status: MET   LONG TERM GOALS: Target date: 07/06/2023  Patient will be independent with a long-term home exercise program for self-management of symptoms.  Baseline: Initial HEP provided at IE (04/13/23); currently participating well (05/17/2023);  Goal status: In-progress  2.  Patient will demonstrate improved FOTO to equal or greater than 69 by visit #10 to demonstrate improvement in overall condition and self-reported functional ability.  Baseline: 59 (04/13/23); 59 at visit #10 (05/17/2023);  Goal status: In-progress  3.  Patient will demonstrate cervical spine rotation equal or greater than 60 degrees bilaterally without increase in pain to improve her ability to check blind spot while driving.  Baseline: R/L 55/54 degrees (04/13/23); R/L 72/72 degrees (05/17/2023);  Goal status: MET  4.  Patient will demonstrate improved grip strength by 5lbs each in both UE to demonstrate improvement in B UE strength and ability to use hands for manual tasks such as working.  Baseline: to be measured visit 2 as appropriate (04/13/23); met - see objective (05/17/2023);  Goal status: MET   5.  Patient will complete community, work and/or recreational activities with 50% less limitation due to current condition.  Baseline:  difficulty with activities that require use B UE, activities with fine motor control, sorting mail/paperwork, crochet, crafting, working, activities with awkward or prolonged postures (04/13/23); estimates 15-20% improvement (05/17/2023);  Goal status: In-progress   PLAN:  PT FREQUENCY: 1-2x/week  PT DURATION: 12 weeks  PLANNED INTERVENTIONS: Therapeutic exercises, Therapeutic activity, Neuromuscular re-education, Patient/Family education, Self Care, Joint mobilization, Dry Needling, Electrical stimulation, Spinal mobilization, Cryotherapy, Moist heat, Manual therapy, and Re-evaluation  PLAN FOR NEXT SESSION: progressive neck/posture/UE/functional strengthening, motor control, and ROM exercises as tolerated. Education. Manual therapy and dry needling as needed. Neurodynamics as appropriate.    Luretha Murphy  Ilsa Iha, PT, DPT 05/25/2023, 3:43 PM  [facility] [address] [place], [place] [address] P: [phone] I F: [phone]

## 2023-05-27 ENCOUNTER — Ambulatory Visit: Payer: Commercial Managed Care - HMO | Admitting: Physical Therapy

## 2023-06-02 ENCOUNTER — Encounter: Payer: Self-pay | Admitting: Physical Therapy

## 2023-06-02 ENCOUNTER — Inpatient Hospital Stay: Payer: Commercial Managed Care - HMO

## 2023-06-02 ENCOUNTER — Ambulatory Visit: Payer: Commercial Managed Care - HMO | Admitting: Physical Therapy

## 2023-06-02 DIAGNOSIS — M6281 Muscle weakness (generalized): Secondary | ICD-10-CM

## 2023-06-02 DIAGNOSIS — M542 Cervicalgia: Secondary | ICD-10-CM

## 2023-06-02 DIAGNOSIS — Z853 Personal history of malignant neoplasm of breast: Secondary | ICD-10-CM | POA: Diagnosis not present

## 2023-06-02 DIAGNOSIS — M792 Neuralgia and neuritis, unspecified: Secondary | ICD-10-CM

## 2023-06-02 DIAGNOSIS — R7401 Elevation of levels of liver transaminase levels: Secondary | ICD-10-CM

## 2023-06-02 DIAGNOSIS — Z171 Estrogen receptor negative status [ER-]: Secondary | ICD-10-CM

## 2023-06-02 DIAGNOSIS — R519 Headache, unspecified: Secondary | ICD-10-CM

## 2023-06-02 DIAGNOSIS — Z95828 Presence of other vascular implants and grafts: Secondary | ICD-10-CM

## 2023-06-02 LAB — COMPREHENSIVE METABOLIC PANEL
ALT: 64 U/L — ABNORMAL HIGH (ref 0–44)
AST: 48 U/L — ABNORMAL HIGH (ref 15–41)
Albumin: 4.8 g/dL (ref 3.5–5.0)
Alkaline Phosphatase: 80 U/L (ref 38–126)
Anion gap: 7 (ref 5–15)
BUN: 10 mg/dL (ref 6–20)
CO2: 23 mmol/L (ref 22–32)
Calcium: 9.2 mg/dL (ref 8.9–10.3)
Chloride: 104 mmol/L (ref 98–111)
Creatinine, Ser: 0.62 mg/dL (ref 0.44–1.00)
GFR, Estimated: >60 mL/min (ref >60)
Glucose, Bld: 140 mg/dL — ABNORMAL HIGH (ref 70–99)
Potassium: 3.9 mmol/L (ref 3.5–5.1)
Sodium: 134 mmol/L — ABNORMAL LOW (ref 135–145)
Total Bilirubin: 0.4 mg/dL (ref 0.3–1.2)
Total Protein: 7.9 g/dL (ref 6.5–8.1)

## 2023-06-02 LAB — CBC WITH DIFFERENTIAL/PLATELET
Abs Immature Granulocytes: 0.08 10*3/uL — ABNORMAL HIGH (ref 0.00–0.07)
Basophils Absolute: 0 10*3/uL (ref 0.0–0.1)
Basophils Relative: 0 %
Eosinophils Absolute: 0 10*3/uL (ref 0.0–0.5)
Eosinophils Relative: 0 %
HCT: 40.9 % (ref 36.0–46.0)
Hemoglobin: 14 g/dL (ref 12.0–15.0)
Immature Granulocytes: 1 %
Lymphocytes Relative: 7 %
Lymphs Abs: 0.8 10*3/uL (ref 0.7–4.0)
MCH: 32.3 pg (ref 26.0–34.0)
MCHC: 34.2 g/dL (ref 30.0–36.0)
MCV: 94.5 fL (ref 80.0–100.0)
Monocytes Absolute: 0.3 10*3/uL (ref 0.1–1.0)
Monocytes Relative: 3 %
Neutro Abs: 10.8 10*3/uL — ABNORMAL HIGH (ref 1.7–7.7)
Neutrophils Relative %: 89 %
Platelets: 239 10*3/uL (ref 150–400)
RBC: 4.33 MIL/uL (ref 3.87–5.11)
RDW: 12.9 % (ref 11.5–15.5)
WBC: 12 10*3/uL — ABNORMAL HIGH (ref 4.0–10.5)
nRBC: 0 % (ref 0.0–0.2)

## 2023-06-02 MED ORDER — HEPARIN SOD (PORK) LOCK FLUSH 100 UNIT/ML IV SOLN
500.0000 [IU] | Freq: Once | INTRAVENOUS | Status: AC
  Administered 2023-06-02: 500 [IU] via INTRAVENOUS
  Filled 2023-06-02: qty 5

## 2023-06-02 MED ORDER — SODIUM CHLORIDE 0.9% FLUSH
10.0000 mL | Freq: Once | INTRAVENOUS | Status: AC
  Administered 2023-06-02: 10 mL via INTRAVENOUS
  Filled 2023-06-02: qty 10

## 2023-06-02 NOTE — Therapy (Signed)
OUTPATIENT PHYSICAL THERAPY TREATMENT   Patient Name: Vickie Mathis MRN: [id number] DOB:03-17-1991, 32 y.o., female Today's Date: 06/02/2023  END OF SESSION:  PT End of Session - 06/02/23 1436     Visit Number 12    Number of Visits 23    Date for PT Re-Evaluation 07/06/23    Authorization Type [employer] reporting period from 05/17/2023    Progress Note Due on Visit 20    PT Start Time 1436    PT Stop Time 1514    PT Time Calculation (min) 38 min    Activity Tolerance Patient tolerated treatment well    Behavior During Therapy 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 AXILLARY NODE DISSECTION;  Surgeon: Carolan Shiver, MD;  Location: [facility];  Service: General;  Laterality: Bilateral;  [place], 1.5 hours [place]Diaz 2.5 hours   BREAST BIOPSY Left 11/26/2020   vision 12:00 6cmfn [facility]   BREAST BIOPSY Left 11/26/2020   LN bx, hydro marker,  fragments of macrometastatic carcinoma   BREAST RECONSTRUCTION WITH PLACEMENT OF TISSUE EXPANDER AND FLEX HD (ACELLULAR HYDRATED DERMIS) Bilateral 06/25/2021   Procedure: BREAST RECONSTRUCTION WITH PLACEMENT OF TISSUE EXPANDER AND FLEX HD (ACELLULAR HYDRATED DERMIS);  Surgeon: Peggye Form, DO;  Location: [facility];  Service: Plastics;  Laterality: Bilateral;   PORTACATH PLACEMENT Right 12/13/2020   Procedure: INSERTION PORT-A-CATH;  Surgeon: Carolan Shiver, MD;  Location: [facility];  Service: General;  Laterality: Right;    REMOVAL OF BILATERAL TISSUE EXPANDERS WITH PLACEMENT OF BILATERAL BREAST IMPLANTS Bilateral 09/15/2021   Procedure: REMOVAL OF BILATERAL TISSUE EXPANDERS;  Surgeon: Peggye Form, DO;  Location:  SURGERY CENTER;  Service: Plastics;  Laterality: Bilateral;   Patient Active Problem List   Diagnosis Date Noted   Chemotherapy-induced neuropathy (HCC) 02/06/2022   Right sided weakness 02/06/2022   Transaminitis 11/20/2021   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 10/25/2021   Malignant neoplasm of left breast in female, estrogen receptor positive (HCC) 06/25/2021   Genetic testing 01/15/2021   Family history of cancer    Invasive carcinoma of breast (HCC) 12/03/2020   Breast mass, left 07/01/2020   Situational mixed anxiety and depressive disorder 07/01/2020   Anxiety state 08/06/2014   Varicose veins of lower extremity 08/06/2014    PCP: Evelena Peat, PA  REFERRING PROVIDER: Evelena Peat, [place]  REFERRING DIAG: migraine without aura and without status migrainosus, not intractable   THERAPY DIAG:  Cervicalgia  Neuralgia and neuritis  Muscle weakness (generalized)  Nonintractable headache, unspecified chronicity pattern, unspecified headache type  Rationale for Evaluation and Treatment: Rehabilitation  ONSET DATE: neck pain chronic over many years, hand paresthesia onset with cancer treatment in 2022  PERTINENT HISTORY:  Patient is a 32 y.o. female who presents to outpatient physical therapy with a referral for medical diagnosis migraine without aura and without status migrainosus, not intractable. This patient's  chief complaints consist of chronic neck pain (R > L) and migriane headaches and bilateral hand/forearm paresthesia (R > L, C8/ulnar nerve distribution worse) leading to the following functional deficits: difficulty with activities that require use B UE, activities with fine motor control, sorting mail/paperwork, crochet, crafting,  working, activities with awkward or prolonged postures. 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  Patient denies hx of stroke, seizures, lung problems, heart problems, diabetes, unexplained weight loss, unexplained changes in bowel or bladder problems, osteoporosis, and spinal surgery.   SUBJECTIVE:                                                                                                                                                                                                         SUBJECTIVE STATEMENT: Patient states it has been really busy today and she has a lot of other activities after her appointment today. She states she felt she slept wrong last night and had an increased pain at the right side of her neck when she woke up. She used the cervical retraction exercises to improve her pain. She was sore after needling for a day but feels it helped her.   PAIN:  NPRS: 2-3/10 right base of spine when turning head or pressing on it. B UT  PATIENT GOALS: Learning how to manage symptoms or manage them better. Strength. To get better. To control it so it doesn't get any worse.    TODAY'S TREATMENT:   Therapeutic exercise: to centralize symptoms and improve ROM, strength, muscular endurance, and activity tolerance required for successful completion of functional activities.  - Upper body ergometer level 7 to encourage joint nutrition, warm tissue, induce analgesic effect of aerobic exercise, improve muscular strength and endurance,  and prepare for remainder of session. 5 min.  - seated with  towel roll, cervical retraction with OP and extension and rotation, 2x10  - prone cervical retraction hold with scapuar retraction to W <> Y and down, 3x10.  Manual therapy: to reduce pain and tissue tension, improve range of motion, neuromodulation, in order to promote improved ability to complete functional activities. PRONE with face in cradle and pillow under ankles - STM to bilateral upper traps  Modality: Dry needling performed to B UT to decrease pain and  spasms along patient's neck and head region with patient in prone utilizing 2 dry needle(s) .25mm x 40mm with 3 sticks at right  UT and 1 sticks at left UT. Patient educated about the risks and benefits from therapy and verbally consents to treatment. Strong twitch response bilaterally.  Dry needling performed by Luretha Murphy. Ilsa Iha PT, DPT who is certified in this technique.   Pt required multimodal cuing for proper technique and to facilitate improved neuromuscular control, strength, range of motion, and functional ability resulting in improved performance and form.  PATIENT EDUCATION:  Education details: Exercise purpose/form. Self management techniques. Education on diagnosis, prognosis, POC, anatomy and physiology of current condition Education on HEP including handout  Person educated: Patient Education method: Explanation, Demonstration, Tactile cues, Verbal cues, and Handouts Education comprehension: verbalized understanding, returned demonstration, and needs further education  HOME EXERCISE PROGRAM: Access Code: [email or web address] URL: [email or web address] Date: 05/06/2023 Prepared by: Norton Blizzard  Exercises - Standing Bilateral Low Shoulder Row with Anchored Resistance  - 1 x daily - 3 sets - 10 reps - Sidelying Thoracic Rotation with Open Book  - 1 x daily - 1-2 sets - 10 reps - 5 seconds/1 breath hold - Prone W Scapular Retraction  - 3-5 x weekly - 3 sets - 10 reps - Neutral Curl Up with Arms Across Chest  - 1 x  daily - 3-5 x weekly - 5 sets - 30 seconds hold  HOME EXERCISE PROGRAM [[id number]] View at [email or web address]" using code: [id number] Cervical Retraction in Sitting w/OP - MDT -  Repeat 20 Repetitions, Hold 1 Second(s), Complete 1 Set, Perform 8 Times a Day  ASSESSMENT:  CLINICAL IMPRESSION: Patient arrives with improved pain upon arrival with good improvement with cervical retraction to extension with rotation. Completed dry needling today per patient preference and due to continued positive result. Patient reports feeling sore but better by end of session.Patient would benefit from continued management of limiting condition by skilled physical therapist to address remaining impairments and functional limitations to work towards stated goals and return to PLOF or maximal functional independence.   From Initial PT Evaluation 04/13/2023:  Patient is a 32 y.o. female referred to outpatient physical therapy with a medical diagnosis of migraine without aura and without status migrainosus, not intractable who presents with signs and symptoms consistent with chronic neck pain and intermittent migraines bilateral hand and forearm paresthesia in glove pattern with worst symptoms at 5th digit. Patient's hand symptoms continue to seem best explained as chemotherapy-induced peripheral neuropathy which is often unable to be detected on nerve conduction testing despite being clinically present due to the fine nerves being affected, while nerve conduction studies are only able to measure changes in large myelinated nerve fibers. Unable to make connection between neck and hand symptoms during exam. Will continue to be alert for signs of radiculopathy and address accordingly. Patient has potential for improved hand strength and control with physical therapy despite the presence of paresthesia. Upon exam, patient was very tender to palpation at suboccipital region which can be a source of cervicogenic contribution to  headache or migraine trigger.  Patient presents with significant pain, ROM, joint stiffness, muscle tension, paresthesia, motor control, muscle performance (strength/power/endurance) and activity tolerance impairments that are limiting ability to complete her usual activities such as those that require use B UE, activities with fine motor control, sorting mail/paperwork, crochet, crafting, working, activities with awkward or prolonged postures without difficulty. Patient will benefit from skilled physical therapy intervention to address current body structure  impairments and activity limitations to improve function and work towards goals set in current POC in order to return to prior level of function or maximal functional improvement.   OBJECTIVE IMPAIRMENTS: decreased activity tolerance, decreased coordination, decreased endurance, decreased knowledge of condition, decreased ROM, decreased strength, impaired perceived functional ability, increased muscle spasms, impaired flexibility, impaired sensation, impaired UE functional use, and pain.   ACTIVITY LIMITATIONS: hygiene/grooming and   difficulty with activities that require use B UE, activities with fine motor control, sorting mail/paperwork, crochet, crafting, working, activities with awkward or prolonged postures  PARTICIPATION LIMITATIONS: meal prep, cleaning, interpersonal relationship, and occupation  PERSONAL FACTORS: Past/current experiences, Profession, Time since onset of injury/illness/exacerbation, and 3+ comorbidities:   Anxiety state; Breast mass, left; Situational mixed anxiety and depressive disorder; Varicose veins of lower extremity; Invasive carcinoma of breast (2022);  Malignant neoplasm of left breast in female, estrogen receptor positive (HCC); 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   REHAB POTENTIAL: Good  CLINICAL DECISION MAKING: Evolving/moderate complexity  EVALUATION COMPLEXITY: Moderate    GOALS: Goals reviewed with patient? Yes  SHORT TERM GOALS: Target date: 04/27/2023  Patient will be independent with initial home exercise program for self-management of symptoms. Baseline: Initial HEP provided at IE (04/13/23); Goal status: MET   LONG TERM GOALS: Target date: 07/06/2023  Patient will be independent with a long-term home exercise program for self-management of symptoms.  Baseline: Initial HEP provided at IE (04/13/23); currently participating well (05/17/2023);  Goal status: In-progress  2.  Patient will demonstrate improved FOTO to equal or greater than 69 by visit #10 to demonstrate improvement in overall condition and self-reported functional ability.  Baseline: 59 (04/13/23); 59 at visit #10 (05/17/2023);  Goal status: In-progress  3.  Patient will demonstrate cervical spine rotation equal or greater than 60 degrees bilaterally without increase in pain to improve her ability to check blind spot while driving.  Baseline: R/L 55/54 degrees (04/13/23); R/L 72/72 degrees (05/17/2023);  Goal status: MET  4.  Patient will demonstrate improved grip strength by 5lbs each in both UE to demonstrate improvement in B UE strength and ability to use hands for manual tasks such as working.  Baseline: to be measured visit 2 as appropriate (04/13/23); met - see objective (05/17/2023);  Goal status: MET   5.  Patient will complete community, work and/or recreational activities with 50% less limitation due to current condition.  Baseline: difficulty with activities that require use B UE, activities with fine motor control, sorting mail/paperwork, crochet, crafting, working, activities with awkward or prolonged postures (04/13/23); estimates 15-20% improvement (05/17/2023);  Goal status: In-progress   PLAN:  PT FREQUENCY: 1-2x/week  PT  DURATION: 12 weeks  PLANNED INTERVENTIONS: Therapeutic exercises, Therapeutic activity, Neuromuscular re-education, Patient/Family education, Self Care, Joint mobilization, Dry Needling, Electrical stimulation, Spinal mobilization, Cryotherapy, Moist heat, Manual therapy, and Re-evaluation  PLAN FOR NEXT SESSION: progressive neck/posture/UE/functional strengthening, motor control, and ROM exercises as tolerated. Education. Manual therapy and dry needling as needed. Neurodynamics as appropriate.    Cira Rue, PT, DPT 06/02/2023, 8:10 PM  [facility] [address] [place], [place] [address] P: [phone] I F: [phone]

## 2023-06-09 ENCOUNTER — Ambulatory Visit: Payer: Commercial Managed Care - HMO | Admitting: Physical Therapy

## 2023-06-15 ENCOUNTER — Encounter: Payer: Self-pay | Admitting: Physical Therapy

## 2023-06-15 ENCOUNTER — Ambulatory Visit: Payer: Commercial Managed Care - HMO | Attending: Medical | Admitting: Physical Therapy

## 2023-06-15 DIAGNOSIS — R519 Headache, unspecified: Secondary | ICD-10-CM | POA: Diagnosis present

## 2023-06-15 DIAGNOSIS — M792 Neuralgia and neuritis, unspecified: Secondary | ICD-10-CM | POA: Insufficient documentation

## 2023-06-15 DIAGNOSIS — M6281 Muscle weakness (generalized): Secondary | ICD-10-CM | POA: Diagnosis present

## 2023-06-15 DIAGNOSIS — M542 Cervicalgia: Secondary | ICD-10-CM | POA: Insufficient documentation

## 2023-06-15 NOTE — Therapy (Signed)
OUTPATIENT PHYSICAL THERAPY TREATMENT   Patient Name: Vickie Mathis MRN: [id number] DOB:10/30/1990, 32 y.o., female Today's Date: 06/15/2023  END OF SESSION:  PT End of Session - 06/15/23 1455     Visit Number 13    Number of Visits 23    Date for PT Re-Evaluation 07/06/23    Authorization Type [employer] reporting period from 05/17/2023    Progress Note Due on Visit 20    PT Start Time 1454    PT Stop Time 1515    PT Time Calculation (min) 21 min    Activity Tolerance Patient tolerated treatment well    Behavior During Therapy [facility] for tasks assessed/performed                 Past Medical History:  Diagnosis Date   Anxiety    Asthma    Breast cancer (HCC) 11/2020   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 AXILLARY NODE DISSECTION;  Surgeon: Carolan Shiver, MD;  Location: [facility];  Service: General;  Laterality: Bilateral;  [place], 1.5 hours [place]Diaz 2.5 hours   BREAST BIOPSY Left 11/26/2020   vision 12:00 6cmfn [facility]   BREAST BIOPSY Left 11/26/2020   LN bx, hydro marker,  fragments of macrometastatic carcinoma   BREAST RECONSTRUCTION WITH PLACEMENT OF TISSUE EXPANDER AND FLEX HD (ACELLULAR HYDRATED DERMIS) Bilateral 06/25/2021   Procedure: BREAST RECONSTRUCTION WITH PLACEMENT OF TISSUE EXPANDER AND FLEX HD (ACELLULAR HYDRATED DERMIS);  Surgeon: Peggye Form, DO;  Location: [facility];  Service: Plastics;  Laterality: Bilateral;   PORTACATH PLACEMENT Right 12/13/2020   Procedure: INSERTION PORT-A-CATH;  Surgeon: Carolan Shiver, MD;  Location: [facility];  Service: General;  Laterality: Right;    REMOVAL OF BILATERAL TISSUE EXPANDERS WITH PLACEMENT OF BILATERAL BREAST IMPLANTS Bilateral 09/15/2021   Procedure: REMOVAL OF BILATERAL TISSUE EXPANDERS;  Surgeon: Peggye Form, DO;  Location: [facility];  Service: Plastics;  Laterality: Bilateral;   Patient Active Problem List   Diagnosis Date Noted   Chemotherapy-induced neuropathy (HCC) 02/06/2022   Right sided weakness 02/06/2022   Transaminitis 11/20/2021   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 10/25/2021   Malignant neoplasm of left breast in female, estrogen receptor positive (HCC) 06/25/2021   Genetic testing 01/15/2021   Family history of cancer    Invasive carcinoma of breast (HCC) 12/03/2020   Breast mass, left 07/01/2020   Situational mixed anxiety and depressive disorder 07/01/2020   Anxiety state 08/06/2014   Varicose veins of lower extremity 08/06/2014    PCP: Evelena Peat, PA  REFERRING PROVIDER: Evelena Peat, [place]  REFERRING DIAG: migraine without aura and without status migrainosus, not intractable   THERAPY DIAG:  Cervicalgia  Neuralgia and neuritis  Muscle weakness (generalized)  Nonintractable headache, unspecified chronicity pattern, unspecified headache type  Rationale for Evaluation and Treatment: Rehabilitation  ONSET DATE: neck pain chronic over many years, hand paresthesia onset with cancer treatment in 2022  PERTINENT HISTORY:  Patient is a 32 y.o. female who presents to outpatient physical therapy with a referral for medical diagnosis migraine without aura and without status migrainosus, not intractable. This  patient's chief complaints consist of chronic neck pain (R > L) and migriane headaches and bilateral hand/forearm paresthesia (R > L, C8/ulnar nerve distribution worse) leading to the following functional deficits: difficulty with activities that require use B UE, activities with fine motor control, sorting mail/paperwork, crochet, crafting,  working, activities with awkward or prolonged postures. 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  Patient denies hx of stroke, seizures, lung problems, heart problems, diabetes, unexplained weight loss, unexplained changes in bowel or bladder problems, osteoporosis, and spinal surgery.   SUBJECTIVE:                                                                                                                                                                                                         SUBJECTIVE STATEMENT: Patient is late today due to being held up at work. She states she has been doing her HEP and working 4 hours a day. She feels like she is not eating enough protein because she does not feel like she is recovering as well as she hoped. She states her neck is feeling okay except when she turns her head side to side. She states she is sleeping wrong, so she awakes with increased pain once a week or every two weeks. Her husband unexpectedly is out of a job, so she feels pressure to increase her hours to 5 hours a week. She states she needs to be full time at her job to get back on work insurance. This would mean 40 hours a week by December some time.   PAIN:  NPRS: 3/10 right base of spine when turning head.   PATIENT GOALS: Learning how to manage symptoms or manage them better. Strength. To get better. To control it so it doesn't get any worse.    TODAY'S TREATMENT:   Therapeutic exercise: to centralize symptoms and improve ROM, strength, muscular endurance, and activity tolerance required for  successful completion of functional activities.  - Upper body ergometer level 7 to encourage joint nutrition, warm tissue, induce analgesic effect of aerobic exercise, improve muscular strength and endurance,  and prepare for remainder of session. 5 min.   Manual therapy: to reduce pain and tissue tension, improve range of motion, neuromodulation, in order to promote improved ability to  complete functional activities. PRONE with face in cradle and pillow under ankles - STM to bilateral upper traps  Modality: Dry needling performed to B UT to decrease pain and spasms along patient's neck and head region with patient in prone utilizing 2 dry needle(s) .25mm x 40mm with 3 sticks at right  UT and 4 sticks at left UT. Patient educated about the risks and benefits from therapy and verbally consents to treatment. Strong twitch response R > L.  Dry needling performed by Luretha Murphy. Ilsa Iha PT, DPT who is certified in this technique.   Pt required multimodal cuing for proper technique and to facilitate improved neuromuscular control, strength, range of motion, and functional ability resulting in improved performance and form.  PATIENT EDUCATION:  Education details: Exercise purpose/form. Self management techniques. Education on diagnosis, prognosis, POC, anatomy and physiology of current condition Education on HEP including handout  Person educated: Patient Education method: Explanation, Demonstration, Tactile cues, Verbal cues, and Handouts Education comprehension: verbalized understanding, returned demonstration, and needs further education  HOME EXERCISE PROGRAM: Access Code: [email or web address] URL: [email or web address] Date: 05/06/2023 Prepared by: Norton Blizzard  Exercises - Standing Bilateral Low Shoulder Row with Anchored Resistance  - 1 x daily - 3 sets - 10 reps - Sidelying Thoracic Rotation with Open Book  - 1 x daily - 1-2 sets - 10 reps - 5 seconds/1 breath hold - Prone W Scapular  Retraction  - 3-5 x weekly - 3 sets - 10 reps - Neutral Curl Up with Arms Across Chest  - 1 x daily - 3-5 x weekly - 5 sets - 30 seconds hold  HOME EXERCISE PROGRAM [[id number]] View at [email or web address]" using code: [id number] Cervical Retraction in Sitting w/OP - MDT -  Repeat 20 Repetitions, Hold 1 Second(s), Complete 1 Set, Perform 8 Times a Day  ASSESSMENT:  CLINICAL IMPRESSION: Patient arrives late after getting delayed at work so session was shorter than planned. Patient reports increased life stress but good participation in HEP. She had strong twitch response at right UT and painful with dry needling at left UT with more mild twitch response. Manual therapy and dry needling interventions utilized due to patient's good participation in HEP and short time available for today's session. Patient would benefit from continued management of limiting condition by skilled physical therapist to address remaining impairments and functional limitations to work towards stated goals and return to PLOF or maximal functional independence.   From Initial PT Evaluation 04/13/2023:  Patient is a 32 y.o. female referred to outpatient physical therapy with a medical diagnosis of migraine without aura and without status migrainosus, not intractable who presents with signs and symptoms consistent with chronic neck pain and intermittent migraines bilateral hand and forearm paresthesia in glove pattern with worst symptoms at 5th digit. Patient's hand symptoms continue to seem best explained as chemotherapy-induced peripheral neuropathy which is often unable to be detected on nerve conduction testing despite being clinically present due to the fine nerves being affected, while nerve conduction studies are only able to measure changes in large myelinated nerve fibers. Unable to make connection between neck and hand symptoms during exam. Will continue to be alert for signs of radiculopathy and address accordingly. Patient has  potential for improved hand strength and control with physical therapy despite the presence of paresthesia. Upon exam, patient was very tender to palpation at suboccipital region which can be a source of cervicogenic contribution to headache or migraine trigger.  Patient presents with significant pain, ROM, joint stiffness, muscle  tension, paresthesia, motor control, muscle performance (strength/power/endurance) and activity tolerance impairments that are limiting ability to complete her usual activities such as those that require use B UE, activities with fine motor control, sorting mail/paperwork, crochet, crafting, working, activities with awkward or prolonged postures without difficulty. Patient will benefit from skilled physical therapy intervention to address current body structure impairments and activity limitations to improve function and work towards goals set in current POC in order to return to prior level of function or maximal functional improvement.   OBJECTIVE IMPAIRMENTS: decreased activity tolerance, decreased coordination, decreased endurance, decreased knowledge of condition, decreased ROM, decreased strength, impaired perceived functional ability, increased muscle spasms, impaired flexibility, impaired sensation, impaired UE functional use, and pain.   ACTIVITY LIMITATIONS: hygiene/grooming and   difficulty with activities that require use B UE, activities with fine motor control, sorting mail/paperwork, crochet, crafting, working, activities with awkward or prolonged postures  PARTICIPATION LIMITATIONS: meal prep, cleaning, interpersonal relationship, and occupation  PERSONAL FACTORS: Past/current experiences, Profession, Time since onset of injury/illness/exacerbation, and 3+ comorbidities:   Anxiety state; Breast mass, left; Situational mixed anxiety and depressive disorder; Varicose veins of lower extremity; Invasive carcinoma of breast (2022);  Malignant neoplasm of left breast in  female, estrogen receptor positive (HCC); 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   REHAB POTENTIAL: Good  CLINICAL DECISION MAKING: Evolving/moderate complexity  EVALUATION COMPLEXITY: Moderate    GOALS: Goals reviewed with patient? Yes  SHORT TERM GOALS: Target date: 04/27/2023  Patient will be independent with initial home exercise program for self-management of symptoms. Baseline: Initial HEP provided at IE (04/13/23); Goal status: MET   LONG TERM GOALS: Target date: 07/06/2023  Patient will be independent with a long-term home exercise program for self-management of symptoms.  Baseline: Initial HEP provided at IE (04/13/23); currently participating well (05/17/2023);  Goal status: In-progress  2.  Patient will demonstrate improved FOTO to equal or greater than 69 by visit #10 to demonstrate improvement in overall condition and self-reported functional ability.  Baseline: 59 (04/13/23); 59 at visit #10 (05/17/2023);  Goal status: In-progress  3.  Patient will demonstrate cervical spine rotation equal or greater than 60 degrees bilaterally without increase in pain to improve her ability to check blind spot while driving.  Baseline: R/L 55/54 degrees (04/13/23); R/L 72/72 degrees (05/17/2023);  Goal status: MET  4.  Patient will demonstrate improved grip strength by 5lbs each in both UE to demonstrate improvement in B UE strength and ability to use hands for manual tasks such as working.  Baseline: to be measured visit 2 as appropriate (04/13/23); met - see objective (05/17/2023);  Goal status: MET   5.  Patient will complete community, work and/or recreational activities with 50% less limitation due to current condition.  Baseline: difficulty with activities that require use B UE, activities with fine motor control,  sorting mail/paperwork, crochet, crafting, working, activities with awkward or prolonged postures (04/13/23); estimates 15-20% improvement (05/17/2023);  Goal status: In-progress   PLAN:  PT FREQUENCY: 1-2x/week  PT DURATION: 12 weeks  PLANNED INTERVENTIONS: Therapeutic exercises, Therapeutic activity, Neuromuscular re-education, Patient/Family education, Self Care, Joint mobilization, Dry Needling, Electrical stimulation, Spinal mobilization, Cryotherapy, Moist heat, Manual therapy, and Re-evaluation  PLAN FOR NEXT SESSION: progressive neck/posture/UE/functional strengthening, motor control, and ROM exercises as tolerated. Education. Manual therapy and dry needling as needed. Neurodynamics as appropriate.    Cira Rue, PT, DPT 06/15/2023,  4:35 PM  [facility] [address] [place], [place] [address] P: [phone] I F: [phone]

## 2023-06-17 ENCOUNTER — Inpatient Hospital Stay: Payer: Commercial Managed Care - HMO

## 2023-06-17 ENCOUNTER — Inpatient Hospital Stay: Payer: Commercial Managed Care - HMO | Attending: Oncology

## 2023-06-17 ENCOUNTER — Ambulatory Visit: Payer: Commercial Managed Care - HMO | Admitting: Physical Therapy

## 2023-06-17 ENCOUNTER — Encounter: Payer: Self-pay | Admitting: Physical Therapy

## 2023-06-17 DIAGNOSIS — M542 Cervicalgia: Secondary | ICD-10-CM

## 2023-06-17 DIAGNOSIS — M6281 Muscle weakness (generalized): Secondary | ICD-10-CM

## 2023-06-17 DIAGNOSIS — Z853 Personal history of malignant neoplasm of breast: Secondary | ICD-10-CM | POA: Insufficient documentation

## 2023-06-17 DIAGNOSIS — R7401 Elevation of levels of liver transaminase levels: Secondary | ICD-10-CM

## 2023-06-17 DIAGNOSIS — R519 Headache, unspecified: Secondary | ICD-10-CM

## 2023-06-17 DIAGNOSIS — C50412 Malignant neoplasm of upper-outer quadrant of left female breast: Secondary | ICD-10-CM

## 2023-06-17 DIAGNOSIS — M792 Neuralgia and neuritis, unspecified: Secondary | ICD-10-CM

## 2023-06-17 LAB — COMPREHENSIVE METABOLIC PANEL
ALT: 37 U/L (ref 0–44)
AST: 33 U/L (ref 15–41)
Albumin: 4.2 g/dL (ref 3.5–5.0)
Alkaline Phosphatase: 70 U/L (ref 38–126)
Anion gap: 6 (ref 5–15)
BUN: 9 mg/dL (ref 6–20)
CO2: 23 mmol/L (ref 22–32)
Calcium: 8.8 mg/dL — ABNORMAL LOW (ref 8.9–10.3)
Chloride: 105 mmol/L (ref 98–111)
Creatinine, Ser: 0.78 mg/dL (ref 0.44–1.00)
GFR, Estimated: >60 mL/min (ref >60)
Glucose, Bld: 134 mg/dL — ABNORMAL HIGH (ref 70–99)
Potassium: 4 mmol/L (ref 3.5–5.1)
Sodium: 134 mmol/L — ABNORMAL LOW (ref 135–145)
Total Bilirubin: 0.7 mg/dL (ref 0.3–1.2)
Total Protein: 7.2 g/dL (ref 6.5–8.1)

## 2023-06-17 LAB — CBC WITH DIFFERENTIAL/PLATELET
Abs Immature Granulocytes: 0.03 10*3/uL (ref 0.00–0.07)
Basophils Absolute: 0.1 10*3/uL (ref 0.0–0.1)
Basophils Relative: 1 %
Eosinophils Absolute: 0.2 10*3/uL (ref 0.0–0.5)
Eosinophils Relative: 2 %
HCT: 38.6 % (ref 36.0–46.0)
Hemoglobin: 13.1 g/dL (ref 12.0–15.0)
Immature Granulocytes: 0 %
Lymphocytes Relative: 16 %
Lymphs Abs: 1.1 10*3/uL (ref 0.7–4.0)
MCH: 32.2 pg (ref 26.0–34.0)
MCHC: 33.9 g/dL (ref 30.0–36.0)
MCV: 94.8 fL (ref 80.0–100.0)
Monocytes Absolute: 0.6 10*3/uL (ref 0.1–1.0)
Monocytes Relative: 8 %
Neutro Abs: 5.2 10*3/uL (ref 1.7–7.7)
Neutrophils Relative %: 73 %
Platelets: 186 10*3/uL (ref 150–400)
RBC: 4.07 MIL/uL (ref 3.87–5.11)
RDW: 12.9 % (ref 11.5–15.5)
WBC: 7.1 10*3/uL (ref 4.0–10.5)
nRBC: 0 % (ref 0.0–0.2)

## 2023-06-17 MED ORDER — HEPARIN SOD (PORK) LOCK FLUSH 100 UNIT/ML IV SOLN
500.0000 [IU] | Freq: Once | INTRAVENOUS | Status: AC
  Administered 2023-06-17: 500 [IU] via INTRAVENOUS
  Filled 2023-06-17: qty 5

## 2023-06-17 NOTE — Therapy (Signed)
OUTPATIENT PHYSICAL THERAPY TREATMENT   Patient Name: Vickie Mathis MRN: [id number] DOB:[birth year], 32 y.o., female Today's Date: 06/17/2023  END OF SESSION:  PT End of Session - 06/17/23 1437     Visit Number 14    Number of Visits 23    Date for PT Re-Evaluation 07/06/23    Authorization Type [employer] reporting period from 05/17/2023    Progress Note Due on Visit 20    PT Start Time 1437    PT Stop Time 1515    PT Time Calculation (min) 38 min    Activity Tolerance Patient tolerated treatment well    Behavior During Therapy [facility] for tasks assessed/performed                  Past Medical History:  Diagnosis Date   Anxiety    Asthma    Breast cancer (HCC) 11/2020   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 AXILLARY NODE DISSECTION;  Surgeon: Carolan Shiver, MD;  Location: [facility];  Service: General;  Laterality: Bilateral;  [place], 1.5 hours [place]Diaz 2.5 hours   BREAST BIOPSY Left 11/26/2020   vision 12:00 6cmfn [facility]   BREAST BIOPSY Left 11/26/2020   LN bx, hydro marker,  fragments of macrometastatic carcinoma   BREAST RECONSTRUCTION WITH PLACEMENT OF TISSUE EXPANDER AND FLEX HD (ACELLULAR HYDRATED DERMIS) Bilateral 06/25/2021   Procedure: BREAST RECONSTRUCTION WITH PLACEMENT OF TISSUE EXPANDER AND FLEX HD (ACELLULAR HYDRATED DERMIS);  Surgeon: Peggye Form, DO;  Location: [facility];  Service: Plastics;  Laterality: Bilateral;   PORTACATH PLACEMENT Right 12/13/2020   Procedure: INSERTION PORT-A-CATH;  Surgeon: Carolan Shiver, MD;  Location: [facility];  Service: General;  Laterality: Right;    REMOVAL OF BILATERAL TISSUE EXPANDERS WITH PLACEMENT OF BILATERAL BREAST IMPLANTS Bilateral 09/15/2021   Procedure: REMOVAL OF BILATERAL TISSUE EXPANDERS;  Surgeon: Peggye Form, DO;  Location: [facility];  Service: Plastics;  Laterality: Bilateral;   Patient Active Problem List   Diagnosis Date Noted   Chemotherapy-induced neuropathy (HCC) 02/06/2022   Right sided weakness 02/06/2022   Transaminitis 11/20/2021   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 10/25/2021   Malignant neoplasm of left breast in female, estrogen receptor positive (HCC) 06/25/2021   Genetic testing 01/15/2021   Family history of cancer    Invasive carcinoma of breast (HCC) 12/03/2020   Breast mass, left 07/01/2020   Situational mixed anxiety and depressive disorder 07/01/2020   Anxiety state 08/06/2014   Varicose veins of lower extremity 08/06/2014    PCP: Evelena Peat, PA  REFERRING PROVIDER: Evelena Peat, [place]  REFERRING DIAG: migraine without aura and without status migrainosus, not intractable   THERAPY DIAG:  Cervicalgia  Neuralgia and neuritis  Muscle weakness (generalized)  Nonintractable headache, unspecified chronicity pattern, unspecified headache type  Rationale for Evaluation and Treatment: Rehabilitation  ONSET DATE: neck pain chronic over many years, hand paresthesia onset with cancer treatment in 2022  PERTINENT HISTORY:  Patient is a 32 y.o. female who presents to outpatient physical therapy with a referral for medical diagnosis migraine without aura and without status migrainosus, not intractable.  This patient's chief complaints consist of chronic neck pain (R > L) and migriane headaches and bilateral hand/forearm paresthesia (R > L, C8/ulnar nerve distribution worse) leading to the following functional deficits: difficulty with activities that require use B UE, activities with fine motor control, sorting mail/paperwork, crochet, crafting,  working, activities with awkward or prolonged postures. 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  Patient denies hx of stroke, seizures, lung problems, heart problems, diabetes, unexplained weight loss, unexplained changes in bowel or bladder problems, osteoporosis, and spinal surgery.   SUBJECTIVE:                                                                                                                                                                                                         SUBJECTIVE STATEMENT: Patient states she is feeling pretty good today. She has not done much today, since it is her day off. She has been doing her HEP.    PAIN:  NPRS: "feeling pretty good"  PATIENT GOALS: Learning how to manage symptoms or manage them better. Strength. To get better. To control it so it doesn't get any worse.    TODAY'S TREATMENT:   Therapeutic exercise: to centralize symptoms and improve ROM, strength, muscular endurance, and activity tolerance required for successful completion of functional activities.  - Upper body ergometer level 7 to encourage joint nutrition, warm tissue, induce analgesic effect of aerobic exercise, improve muscular strength and endurance,  and prepare for remainder of session. 5 min.  - seated with towel roll, cervical retraction with OP and extension and rotation, 1x10  - prone cervical retraction hold with scapuar retraction to W <> Y and down, 3x10  Manual therapy: to reduce pain and tissue tension, improve range of motion, neuromodulation, in order to promote  improved ability to complete functional activities. PRONE with face in cradle and pillow under ankles - STM including sustained pressure to bilateral upper traps  Modality: Dry needling performed to B UT to decrease pain and spasms along patient's neck and head region with patient in prone utilizing 2 dry needle(s) .25mm x 40mm with 1 sticks at right  UT and 3 sticks at left UT. Patient educated about the risks and benefits from therapy and verbally consents to treatment. Strong twitch  response R > L.  Dry needling performed by Luretha Murphy. Ilsa Iha PT, DPT who is certified in this technique.   Pt required multimodal cuing for proper technique and to facilitate improved neuromuscular control, strength, range of motion, and functional ability resulting in improved performance and form.  PATIENT EDUCATION:  Education details: Exercise purpose/form. Self management techniques. Education on diagnosis, prognosis, POC, anatomy and physiology of current condition Education on HEP including handout  Person educated: Patient Education method: Explanation, Demonstration, Tactile cues, Verbal cues, and Handouts Education comprehension: verbalized understanding, returned demonstration, and needs further education  HOME EXERCISE PROGRAM: Access Code: [email or web address] URL: [email or web address] Date: 05/06/2023 Prepared by: Norton Blizzard  Exercises - Standing Bilateral Low Shoulder Row with Anchored Resistance  - 1 x daily - 3 sets - 10 reps - Sidelying Thoracic Rotation with Open Book  - 1 x daily - 1-2 sets - 10 reps - 5 seconds/1 breath hold - Prone W Scapular Retraction  - 3-5 x weekly - 3 sets - 10 reps - Neutral Curl Up with Arms Across Chest  - 1 x daily - 3-5 x weekly - 5 sets - 30 seconds hold  HOME EXERCISE PROGRAM [[id number]] View at "[email or web address]" using code: [id number] Cervical Retraction in Sitting w/OP - MDT -  Repeat 20 Repetitions, Hold 1 Second(s), Complete 1 Set, Perform 8 Times a  Day  ASSESSMENT:  CLINICAL IMPRESSION: Patient arrives reporting good response to last PT session and decreased pain on her day off. Continued with postural strengthening exercises and dry needling/manual therapy to help continue pain control and long term improvement. Plan to reduce visit frequency to 1x a week to work towards improved independence and self management. Patient would benefit from continued management of limiting condition by skilled physical therapist to address remaining impairments and functional limitations to work towards stated goals and return to PLOF or maximal functional independence. .   From Initial PT Evaluation 04/13/2023:  Patient is a 32 y.o. female referred to outpatient physical therapy with a medical diagnosis of migraine without aura and without status migrainosus, not intractable who presents with signs and symptoms consistent with chronic neck pain and intermittent migraines bilateral hand and forearm paresthesia in glove pattern with worst symptoms at 5th digit. Patient's hand symptoms continue to seem best explained as chemotherapy-induced peripheral neuropathy which is often unable to be detected on nerve conduction testing despite being clinically present due to the fine nerves being affected, while nerve conduction studies are only able to measure changes in large myelinated nerve fibers. Unable to make connection between neck and hand symptoms during exam. Will continue to be alert for signs of radiculopathy and address accordingly. Patient has potential for improved hand strength and control with physical therapy despite the presence of paresthesia. Upon exam, patient was very tender to palpation at suboccipital region which can be a source of cervicogenic contribution to headache or migraine trigger.  Patient presents with significant pain, ROM, joint stiffness, muscle tension, paresthesia, motor control, muscle performance (strength/power/endurance) and activity  tolerance impairments that are limiting ability to complete her usual activities such as those that require use B UE, activities with fine motor control, sorting mail/paperwork, crochet, crafting, working, activities with awkward or prolonged postures without difficulty. Patient will benefit from skilled physical therapy intervention to address current body structure impairments and activity limitations to improve function and work towards goals set in current POC in order to return to prior level of function or maximal functional improvement.   OBJECTIVE IMPAIRMENTS:  decreased activity tolerance, decreased coordination, decreased endurance, decreased knowledge of condition, decreased ROM, decreased strength, impaired perceived functional ability, increased muscle spasms, impaired flexibility, impaired sensation, impaired UE functional use, and pain.   ACTIVITY LIMITATIONS: hygiene/grooming and   difficulty with activities that require use B UE, activities with fine motor control, sorting mail/paperwork, crochet, crafting, working, activities with awkward or prolonged postures  PARTICIPATION LIMITATIONS: meal prep, cleaning, interpersonal relationship, and occupation  PERSONAL FACTORS: Past/current experiences, Profession, Time since onset of injury/illness/exacerbation, and 3+ comorbidities:   Anxiety state; Breast mass, left; Situational mixed anxiety and depressive disorder; Varicose veins of lower extremity; Invasive carcinoma of breast (2022);  Malignant neoplasm of left breast in female, estrogen receptor positive (HCC); 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   REHAB POTENTIAL: Good  CLINICAL DECISION MAKING: Evolving/moderate complexity  EVALUATION COMPLEXITY: Moderate    GOALS: Goals reviewed with patient? Yes  SHORT TERM  GOALS: Target date: 04/27/2023  Patient will be independent with initial home exercise program for self-management of symptoms. Baseline: Initial HEP provided at IE (04/13/23); Goal status: MET   LONG TERM GOALS: Target date: 07/06/2023  Patient will be independent with a long-term home exercise program for self-management of symptoms.  Baseline: Initial HEP provided at IE (04/13/23); currently participating well (05/17/2023);  Goal status: In-progress  2.  Patient will demonstrate improved FOTO to equal or greater than 69 by visit #10 to demonstrate improvement in overall condition and self-reported functional ability.  Baseline: 59 (04/13/23); 59 at visit #10 (05/17/2023);  Goal status: In-progress  3.  Patient will demonstrate cervical spine rotation equal or greater than 60 degrees bilaterally without increase in pain to improve her ability to check blind spot while driving.  Baseline: R/L 55/54 degrees (04/13/23); R/L 72/72 degrees (05/17/2023);  Goal status: MET  4.  Patient will demonstrate improved grip strength by 5lbs each in both UE to demonstrate improvement in B UE strength and ability to use hands for manual tasks such as working.  Baseline: to be measured visit 2 as appropriate (04/13/23); met - see objective (05/17/2023);  Goal status: MET   5.  Patient will complete community, work and/or recreational activities with 50% less limitation due to current condition.  Baseline: difficulty with activities that require use B UE, activities with fine motor control, sorting mail/paperwork, crochet, crafting, working, activities with awkward or prolonged postures (04/13/23); estimates 15-20% improvement (05/17/2023);  Goal status: In-progress   PLAN:  PT FREQUENCY: 1-2x/week  PT DURATION: 12 weeks  PLANNED INTERVENTIONS: Therapeutic exercises, Therapeutic activity, Neuromuscular re-education, Patient/Family education, Self Care, Joint mobilization, Dry Needling, Electrical  stimulation, Spinal mobilization, Cryotherapy, Moist heat, Manual therapy, and Re-evaluation  PLAN FOR NEXT SESSION: progressive neck/posture/UE/functional strengthening, motor control, and ROM exercises as tolerated. Education. Manual therapy and dry needling as needed. Neurodynamics as appropriate.    Cira Rue, PT, DPT 06/17/2023, 3:22 PM  [facility] [address] [place], [place] [address] P: [phone] I F: [phone]

## 2023-06-22 ENCOUNTER — Encounter: Payer: Self-pay | Admitting: Physical Therapy

## 2023-06-22 ENCOUNTER — Ambulatory Visit: Payer: Commercial Managed Care - HMO | Admitting: Physical Therapy

## 2023-06-22 DIAGNOSIS — M542 Cervicalgia: Secondary | ICD-10-CM

## 2023-06-22 DIAGNOSIS — M6281 Muscle weakness (generalized): Secondary | ICD-10-CM

## 2023-06-22 DIAGNOSIS — R519 Headache, unspecified: Secondary | ICD-10-CM

## 2023-06-22 DIAGNOSIS — M792 Neuralgia and neuritis, unspecified: Secondary | ICD-10-CM

## 2023-06-22 NOTE — Therapy (Signed)
OUTPATIENT PHYSICAL THERAPY TREATMENT   Patient Name: Vickie Mathis MRN: [id number] DOB:1991/07/17, 32 y.o., female Today's Date: 06/22/2023  END OF SESSION:  PT End of Session - 06/22/23 1524     Visit Number 15    Number of Visits 23    Date for PT Re-Evaluation 07/06/23    Authorization Type [employer] reporting period from 05/17/2023    Progress Note Due on Visit 20    PT Start Time 1524    PT Stop Time 1600    PT Time Calculation (min) 36 min    Activity Tolerance Patient tolerated treatment well    Behavior During Therapy [facility] for tasks assessed/performed              Past Medical History:  Diagnosis Date   Anxiety    Asthma    Breast cancer (HCC) 11/2020   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 AXILLARY NODE DISSECTION;  Surgeon: Carolan Shiver, MD;  Location: [facility] ORS;  Service: General;  Laterality: Bilateral;  [place], 1.5 hours Cintron-Diaz 2.5 hours   BREAST BIOPSY Left 11/26/2020   vision 12:00 6cmfn [facility]   BREAST BIOPSY Left 11/26/2020   LN bx, hydro marker,  fragments of macrometastatic carcinoma   BREAST RECONSTRUCTION WITH PLACEMENT OF TISSUE EXPANDER AND FLEX HD (ACELLULAR HYDRATED DERMIS) Bilateral 06/25/2021   Procedure: BREAST RECONSTRUCTION WITH PLACEMENT OF TISSUE EXPANDER AND FLEX HD (ACELLULAR HYDRATED DERMIS);  Surgeon: Peggye Form, DO;  Location: [facility];  Service: Plastics;  Laterality: Bilateral;   PORTACATH PLACEMENT Right 12/13/2020   Procedure: INSERTION PORT-A-CATH;  Surgeon: Carolan Shiver, MD;  Location: [facility];  Service: General;  Laterality: Right;    REMOVAL OF BILATERAL TISSUE EXPANDERS WITH PLACEMENT OF BILATERAL BREAST IMPLANTS Bilateral 09/15/2021   Procedure: REMOVAL OF BILATERAL TISSUE EXPANDERS;  Surgeon: Peggye Form, DO;  Location: [facility];  Service: Plastics;  Laterality: Bilateral;   Patient Active Problem List   Diagnosis Date Noted   Chemotherapy-induced neuropathy (HCC) 02/06/2022   Right sided weakness 02/06/2022   Transaminitis 11/20/2021   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 10/25/2021   Malignant neoplasm of left breast in female, estrogen receptor positive (HCC) 06/25/2021   Genetic testing 01/15/2021   Family history of cancer    Invasive carcinoma of breast (HCC) 12/03/2020   Breast mass, left 07/01/2020   Situational mixed anxiety and depressive disorder 07/01/2020   Anxiety state 08/06/2014   Varicose veins of lower extremity 08/06/2014    PCP: Evelena Peat, PA  REFERRING PROVIDER: Evelena Peat, [place]  REFERRING DIAG: migraine without aura and without status migrainosus, not intractable   THERAPY DIAG:  Cervicalgia  Neuralgia and neuritis  Muscle weakness (generalized)  Nonintractable headache, unspecified chronicity pattern, unspecified headache type  Rationale for Evaluation and Treatment: Rehabilitation  ONSET DATE: neck pain chronic over many years, hand paresthesia onset with cancer treatment in 2022  PERTINENT HISTORY:  Patient is a 32 y.o. female who presents to outpatient physical therapy with a referral for medical diagnosis migraine without aura and without status migrainosus, not intractable. This patient's chief complaints  consist of chronic neck pain (R > L) and migriane headaches and bilateral hand/forearm paresthesia (R > L, C8/ulnar nerve distribution worse) leading to the following functional deficits: difficulty with activities that require use B UE, activities with fine motor control, sorting mail/paperwork, crochet, crafting,  working, activities with awkward or prolonged postures. 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  Patient denies hx of stroke, seizures, lung problems, heart problems, diabetes, unexplained weight loss, unexplained changes in bowel or bladder problems, osteoporosis, and spinal surgery.   SUBJECTIVE:                                                                                                                                                                                                         SUBJECTIVE STATEMENT: Patient states she has been feeling tired and tight recently. She feels she needs to get back to her yoga, and for some reason she has not slept well in several nights. She needs to be back full time by November/December to be able to get her health insurance back and she is not sure if she is capable of that.  PAIN:  NPRS: 2-4/10 bilateral neck depending on if she is moving or not  PATIENT GOALS: Learning how to manage symptoms or manage them better. Strength. To get better. To control it so it doesn't get any worse.    TODAY'S TREATMENT:   Therapeutic exercise: to centralize symptoms and improve ROM, strength, muscular endurance, and activity tolerance required for successful completion of functional activities.  - Upper body ergometer level 7 to encourage joint nutrition, warm tissue, induce analgesic effect of aerobic exercise, improve muscular strength and endurance,  and prepare for remainder of session. 5 min.  - prone cervical retraction hold with scapuar retraction to W <> Y and  down, 3x10 - seated with towel roll, cervical retraction with OP, 1x10 (to extension painful) - standing wall angels with towel roll placed vertically along spine, 2x10 (painful).  - standing pec stretch in doorway, 1x35 seconds.   Manual therapy: to reduce pain and tissue tension, improve range of motion, neuromodulation, in order to promote improved ability to complete functional activities. PRONE with face in cradle and pillow under ankles - STM including sustained pressure to bilateral upper traps SEATED -  cervical retraction with clinician overpressure, 2x6  Modality: Dry needling performed to B UT to decrease pain and spasms along patient's neck and head region with patient in prone utilizing 1 dry needle(s) .25mm x 40mm with 1 sticks at right  UT and 1 stick at left UT. Patient educated about the risks and benefits from therapy and verbally consents to treatment. Strong twitch response R UT. Dry needling performed by Luretha Murphy. Ilsa Iha PT, DPT who is certified in this technique.   Pt required multimodal cuing for proper technique and to facilitate improved neuromuscular control, strength, range of motion, and functional ability resulting in improved performance and form.  PATIENT EDUCATION:  Education details: Exercise purpose/form. Self management techniques. Education on diagnosis, prognosis, POC, anatomy and physiology of current condition Education on HEP including handout  Person educated: Patient Education method: Explanation, Demonstration, Tactile cues, Verbal cues, and Handouts Education comprehension: verbalized understanding, returned demonstration, and needs further education  HOME EXERCISE PROGRAM: Access Code: [email or web address] URL: [email or web address] Date: 05/06/2023 Prepared by: Norton Blizzard  Exercises - Standing Bilateral Low Shoulder Row with Anchored Resistance  - 1 x daily - 3 sets - 10 reps - Sidelying Thoracic Rotation with Open Book  - 1 x daily - 1-2  sets - 10 reps - 5 seconds/1 breath hold - Prone W Scapular Retraction  - 3-5 x weekly - 3 sets - 10 reps - Neutral Curl Up with Arms Across Chest  - 1 x daily - 3-5 x weekly - 5 sets - 30 seconds hold  HOME EXERCISE PROGRAM [[id number]] View at [email or web address]" using code: [id number] Cervical Retraction in Sitting w/OP - MDT -  Repeat 20 Repetitions, Hold 1 Second(s), Complete 1 Set, Perform 8 Times a Day  ASSESSMENT:  CLINICAL IMPRESSION: Patient arrives reporting increased pain with poor sleep lately. Today's session continued to utilize dry needling and repeated motions to help with pain control and continued with strengthening as tolerated. Patient found strengthening exercises bothersome to her pain, especially wall angels. Plan to continue with interventions for pain control and strengthening next session as appropriate. Patient would benefit from continued management of limiting condition by skilled physical therapist to address remaining impairments and functional limitations to work towards stated goals and return to PLOF or maximal functional independence.   From Initial PT Evaluation 04/13/2023:  Patient is a 32 y.o. female referred to outpatient physical therapy with a medical diagnosis of migraine without aura and without status migrainosus, not intractable who presents with signs and symptoms consistent with chronic neck pain and intermittent migraines bilateral hand and forearm paresthesia in glove pattern with worst symptoms at 5th digit. Patient's hand symptoms continue to seem best explained as chemotherapy-induced peripheral neuropathy which is often unable to be detected on nerve conduction testing despite being clinically present due to the fine nerves being affected, while nerve conduction studies are only able to measure changes in large myelinated nerve fibers. Unable to make connection between neck and hand symptoms during exam. Will continue to be alert for signs of  radiculopathy and address accordingly. Patient has potential for improved hand strength and control with physical therapy despite the presence of paresthesia. Upon exam, patient was very tender to palpation at suboccipital region which can be a source of cervicogenic contribution to headache or migraine trigger.  Patient presents with significant pain, ROM, joint stiffness, muscle tension, paresthesia, motor control, muscle performance (strength/power/endurance) and activity tolerance impairments that are limiting ability to complete her usual activities such as those that require  use B UE, activities with fine motor control, sorting mail/paperwork, crochet, crafting, working, activities with awkward or prolonged postures without difficulty. Patient will benefit from skilled physical therapy intervention to address current body structure impairments and activity limitations to improve function and work towards goals set in current POC in order to return to prior level of function or maximal functional improvement.   OBJECTIVE IMPAIRMENTS: decreased activity tolerance, decreased coordination, decreased endurance, decreased knowledge of condition, decreased ROM, decreased strength, impaired perceived functional ability, increased muscle spasms, impaired flexibility, impaired sensation, impaired UE functional use, and pain.   ACTIVITY LIMITATIONS: hygiene/grooming and   difficulty with activities that require use B UE, activities with fine motor control, sorting mail/paperwork, crochet, crafting, working, activities with awkward or prolonged postures  PARTICIPATION LIMITATIONS: meal prep, cleaning, interpersonal relationship, and occupation  PERSONAL FACTORS: Past/current experiences, Profession, Time since onset of injury/illness/exacerbation, and 3+ comorbidities:   Anxiety state; Breast mass, left; Situational mixed anxiety and depressive disorder; Varicose veins of lower extremity; Invasive carcinoma of  breast (2022);  Malignant neoplasm of left breast in female, estrogen receptor positive (HCC); 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   REHAB POTENTIAL: Good  CLINICAL DECISION MAKING: Evolving/moderate complexity  EVALUATION COMPLEXITY: Moderate    GOALS: Goals reviewed with patient? Yes  SHORT TERM GOALS: Target date: 04/27/2023  Patient will be independent with initial home exercise program for self-management of symptoms. Baseline: Initial HEP provided at IE (04/13/23); Goal status: MET   LONG TERM GOALS: Target date: 07/06/2023  Patient will be independent with a long-term home exercise program for self-management of symptoms.  Baseline: Initial HEP provided at IE (04/13/23); currently participating well (05/17/2023);  Goal status: In-progress  2.  Patient will demonstrate improved FOTO to equal or greater than 69 by visit #10 to demonstrate improvement in overall condition and self-reported functional ability.  Baseline: 59 (04/13/23); 59 at visit #10 (05/17/2023);  Goal status: In-progress  3.  Patient will demonstrate cervical spine rotation equal or greater than 60 degrees bilaterally without increase in pain to improve her ability to check blind spot while driving.  Baseline: R/L 55/54 degrees (04/13/23); R/L 72/72 degrees (05/17/2023);  Goal status: MET  4.  Patient will demonstrate improved grip strength by 5lbs each in both UE to demonstrate improvement in B UE strength and ability to use hands for manual tasks such as working.  Baseline: to be measured visit 2 as appropriate (04/13/23); met - see objective (05/17/2023);  Goal status: MET   5.  Patient will complete community, work and/or recreational activities with 50% less limitation due to current condition.  Baseline: difficulty with activities that  require use B UE, activities with fine motor control, sorting mail/paperwork, crochet, crafting, working, activities with awkward or prolonged postures (04/13/23); estimates 15-20% improvement (05/17/2023);  Goal status: In-progress   PLAN:  PT FREQUENCY: 1-2x/week  PT DURATION: 12 weeks  PLANNED INTERVENTIONS: Therapeutic exercises, Therapeutic activity, Neuromuscular re-education, Patient/Family education, Self Care, Joint mobilization, Dry Needling, Electrical stimulation, Spinal mobilization, Cryotherapy, Moist heat, Manual therapy, and Re-evaluation  PLAN FOR NEXT SESSION: progressive neck/posture/UE/functional strengthening, motor control, and ROM exercises as tolerated. Education. Manual therapy and dry needling as needed. Neurodynamics as appropriate.    Cira Rue, PT, DPT 06/22/2023, 4:05 PM  [facility] [address] [place], [place] [address] P: [phone] I F: [phone]

## 2023-06-24 ENCOUNTER — Encounter: Payer: Commercial Managed Care - HMO | Admitting: Physical Therapy

## 2023-06-25 ENCOUNTER — Other Ambulatory Visit: Payer: Commercial Managed Care - HMO

## 2023-06-29 ENCOUNTER — Encounter: Payer: Self-pay | Admitting: Physical Therapy

## 2023-06-29 ENCOUNTER — Ambulatory Visit: Payer: Commercial Managed Care - HMO | Admitting: Physical Therapy

## 2023-06-29 DIAGNOSIS — M542 Cervicalgia: Secondary | ICD-10-CM

## 2023-06-29 DIAGNOSIS — M792 Neuralgia and neuritis, unspecified: Secondary | ICD-10-CM

## 2023-06-29 DIAGNOSIS — R519 Headache, unspecified: Secondary | ICD-10-CM

## 2023-06-29 DIAGNOSIS — M6281 Muscle weakness (generalized): Secondary | ICD-10-CM

## 2023-06-29 NOTE — Therapy (Signed)
OUTPATIENT PHYSICAL THERAPY TREATMENT   Patient Name: Vickie Mathis MRN: [id number] DOB:Dec 31, 1990, 32 y.o., female Today's Date: 06/29/2023  END OF SESSION:  PT End of Session - 06/29/23 1539     Visit Number 16    Number of Visits 23    Date for PT Re-Evaluation 07/06/23    Authorization Type [employer] reporting period from 05/17/2023    Progress Note Due on Visit 20    PT Start Time 1521    PT Stop Time 1605    PT Time Calculation (min) 44 min    Activity Tolerance Patient tolerated treatment well    Behavior During Therapy [facility] for tasks assessed/performed               Past Medical History:  Diagnosis Date   Anxiety    Asthma    Breast cancer (HCC) 11/2020   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 AXILLARY NODE DISSECTION;  Surgeon: Carolan Shiver, MD;  Location: [facility];  Service: General;  Laterality: Bilateral;  [place], 1.5 hours [place]Diaz 2.5 hours   BREAST BIOPSY Left 11/26/2020   vision 12:00 6cmfn [facility]   BREAST BIOPSY Left 11/26/2020   LN bx, hydro marker,  fragments of macrometastatic carcinoma   BREAST RECONSTRUCTION WITH PLACEMENT OF TISSUE EXPANDER AND FLEX HD (ACELLULAR HYDRATED DERMIS) Bilateral 06/25/2021   Procedure: BREAST RECONSTRUCTION WITH PLACEMENT OF TISSUE EXPANDER AND FLEX HD (ACELLULAR HYDRATED DERMIS);  Surgeon: Peggye Form, DO;  Location: [facility];  Service: Plastics;  Laterality: Bilateral;   PORTACATH PLACEMENT Right 12/13/2020   Procedure: INSERTION PORT-A-CATH;  Surgeon: Carolan Shiver, MD;  Location: [facility];  Service: General;  Laterality: Right;    REMOVAL OF BILATERAL TISSUE EXPANDERS WITH PLACEMENT OF BILATERAL BREAST IMPLANTS Bilateral 09/15/2021   Procedure: REMOVAL OF BILATERAL TISSUE EXPANDERS;  Surgeon: Peggye Form, DO;  Location: [facility];  Service: Plastics;  Laterality: Bilateral;   Patient Active Problem List   Diagnosis Date Noted   Chemotherapy-induced neuropathy (HCC) 02/06/2022   Right sided weakness 02/06/2022   Transaminitis 11/20/2021   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 10/25/2021   Malignant neoplasm of left breast in female, estrogen receptor positive (HCC) 06/25/2021   Genetic testing 01/15/2021   Family history of cancer    Invasive carcinoma of breast (HCC) 12/03/2020   Breast mass, left 07/01/2020   Situational mixed anxiety and depressive disorder 07/01/2020   Anxiety state 08/06/2014   Varicose veins of lower extremity 08/06/2014    PCP: Evelena Peat, PA  REFERRING PROVIDER: Evelena Peat, [place]  REFERRING DIAG: migraine without aura and without status migrainosus, not intractable   THERAPY DIAG:  Cervicalgia  Neuralgia and neuritis  Muscle weakness (generalized)  Nonintractable headache, unspecified chronicity pattern, unspecified headache type  Rationale for Evaluation and Treatment: Rehabilitation  ONSET DATE: neck pain chronic over many years, hand paresthesia onset with cancer treatment in 2022  PERTINENT HISTORY:  Patient is a 32 y.o. female who presents to outpatient physical therapy with a referral for medical diagnosis migraine without aura and without status migrainosus, not intractable. This patient's chief  complaints consist of chronic neck pain (R > L) and migriane headaches and bilateral hand/forearm paresthesia (R > L, C8/ulnar nerve distribution worse) leading to the following functional deficits: difficulty with activities that require use B UE, activities with fine motor control, sorting mail/paperwork, crochet, crafting,  working, activities with awkward or prolonged postures. 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  Patient denies hx of stroke, seizures, lung problems, heart problems, diabetes, unexplained weight loss, unexplained changes in bowel or bladder problems, osteoporosis, and spinal surgery.   SUBJECTIVE:                                                                                                                                                                                                         SUBJECTIVE STATEMENT: Patient states her head has felt like "a bowling ball" at work today since she "slept wrong." She has been doing her HEP.    PAIN:  NPRS: 3-4/10 bilateral upper cervical spine and upper traps.   PATIENT GOALS: Learning how to manage symptoms or manage them better. Strength. To get better. To control it so it doesn't get any worse.    TODAY'S TREATMENT:   Therapeutic exercise: to centralize symptoms and improve ROM, strength, muscular endurance, and activity tolerance required for successful completion of functional activities.  - Upper body ergometer level 7 to encourage joint nutrition, warm tissue, induce analgesic effect of aerobic exercise, improve muscular strength and endurance,  and prepare for remainder of session. 5 min.  - prone lat pull on total gym at level 10, 2x10 - squat to overhead press, 2x10 tapping 17 inch chair and using 5#DB. (Cuing to keep knees lined up with feet) - inclined bilateral scaption, laying on Total Gym at level 26, 2x10 AROM.  - chin tuck with lift,  deep neck flexor endurance, 3x30 seconds  Manual therapy: to reduce pain and tissue tension, improve range of motion, neuromodulation, in order to promote improved ability to complete functional activities. PRONE with face in cradle and pillow under ankles - STM to B cervical spine paraspinals, B UT, and B suboccipital muscles  Modality: Dry needling performed to posterior cervical spine musculature to decrease pain and spasms along patient's neck and head region with patient in prone utilizing 3 dry  needle(s) .25mm x 40mm with 1 stick at each side at bilateral UT, mid cervical multifidi, and suboccipital region. Patient educated about the risks and benefits from therapy and verbally consents to treatment.  Dry needling performed by Luretha Murphy. Ilsa Iha PT, DPT who is certified in this technique.  Pt required multimodal cuing for proper technique and to facilitate improved neuromuscular control, strength, range of motion, and functional ability resulting in improved performance and form.  PATIENT EDUCATION:  Education details: Exercise purpose/form. Self management techniques. Education on diagnosis, prognosis, POC, anatomy and physiology of current condition Education on HEP including handout  Person educated: Patient Education method: Explanation, Demonstration, Tactile cues, Verbal cues, and Handouts Education comprehension: verbalized understanding, returned demonstration, and needs further education  HOME EXERCISE PROGRAM: Access Code: [email or web address] URL: [email or web address] Date: 05/06/2023 Prepared by: Norton Blizzard  Exercises - Standing Bilateral Low Shoulder Row with Anchored Resistance  - 1 x daily - 3 sets - 10 reps - Sidelying Thoracic Rotation with Open Book  - 1 x daily - 1-2 sets - 10 reps - 5 seconds/1 breath hold - Prone W Scapular Retraction  - 3-5 x weekly - 3 sets - 10 reps - Neutral Curl Up with Arms Across Chest  - 1 x daily - 3-5 x weekly - 5 sets - 30 seconds  hold  HOME EXERCISE PROGRAM [[id number]] View at [email or web address]" using code: [id number] Cervical Retraction in Sitting w/OP - MDT -  Repeat 20 Repetitions, Hold 1 Second(s), Complete 1 Set, Perform 8 Times a Day  ASSESSMENT:  CLINICAL IMPRESSION: Patient arrives reporting increased pain and feeling of heavy head today. Today's session focused on progressing postural and neck strengthening exercises with strong fatigue noted following. Dry needling utilized at end of session to address neck pain. Patient with significant soreness at bilateral suboccipital region but good tolerance overall. Plan to continue with strengthening next session. Patient would benefit from continued management of limiting condition by skilled physical therapist to address remaining impairments and functional limitations to work towards stated goals and return to PLOF or maximal functional independence.   From Initial PT Evaluation 04/13/2023:  Patient is a 32 y.o. female referred to outpatient physical therapy with a medical diagnosis of migraine without aura and without status migrainosus, not intractable who presents with signs and symptoms consistent with chronic neck pain and intermittent migraines bilateral hand and forearm paresthesia in glove pattern with worst symptoms at 5th digit. Patient's hand symptoms continue to seem best explained as chemotherapy-induced peripheral neuropathy which is often unable to be detected on nerve conduction testing despite being clinically present due to the fine nerves being affected, while nerve conduction studies are only able to measure changes in large myelinated nerve fibers. Unable to make connection between neck and hand symptoms during exam. Will continue to be alert for signs of radiculopathy and address accordingly. Patient has potential for improved hand strength and control with physical therapy despite the presence of paresthesia. Upon exam, patient was very tender to palpation  at suboccipital region which can be a source of cervicogenic contribution to headache or migraine trigger.  Patient presents with significant pain, ROM, joint stiffness, muscle tension, paresthesia, motor control, muscle performance (strength/power/endurance) and activity tolerance impairments that are limiting ability to complete her usual activities such as those that require use B UE, activities with fine motor control, sorting mail/paperwork, crochet, crafting, working, activities with awkward or prolonged postures without difficulty. Patient will benefit from skilled physical therapy intervention to address current body structure  impairments and activity limitations to improve function and work towards goals set in current POC in order to return to prior level of function or maximal functional improvement.   OBJECTIVE IMPAIRMENTS: decreased activity tolerance, decreased coordination, decreased endurance, decreased knowledge of condition, decreased ROM, decreased strength, impaired perceived functional ability, increased muscle spasms, impaired flexibility, impaired sensation, impaired UE functional use, and pain.   ACTIVITY LIMITATIONS: hygiene/grooming and   difficulty with activities that require use B UE, activities with fine motor control, sorting mail/paperwork, crochet, crafting, working, activities with awkward or prolonged postures  PARTICIPATION LIMITATIONS: meal prep, cleaning, interpersonal relationship, and occupation  PERSONAL FACTORS: Past/current experiences, Profession, Time since onset of injury/illness/exacerbation, and 3+ comorbidities:   Anxiety state; Breast mass, left; Situational mixed anxiety and depressive disorder; Varicose veins of lower extremity; Invasive carcinoma of breast (2022);  Malignant neoplasm of left breast in female, estrogen receptor positive (HCC); 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   REHAB POTENTIAL: Good  CLINICAL DECISION MAKING: Evolving/moderate complexity  EVALUATION COMPLEXITY: Moderate    GOALS: Goals reviewed with patient? Yes  SHORT TERM GOALS: Target date: 04/27/2023  Patient will be independent with initial home exercise program for self-management of symptoms. Baseline: Initial HEP provided at IE (04/13/23); Goal status: MET   LONG TERM GOALS: Target date: 07/06/2023  Patient will be independent with a long-term home exercise program for self-management of symptoms.  Baseline: Initial HEP provided at IE (04/13/23); currently participating well (05/17/2023);  Goal status: In-progress  2.  Patient will demonstrate improved FOTO to equal or greater than 69 by visit #10 to demonstrate improvement in overall condition and self-reported functional ability.  Baseline: 59 (04/13/23); 59 at visit #10 (05/17/2023);  Goal status: In-progress  3.  Patient will demonstrate cervical spine rotation equal or greater than 60 degrees bilaterally without increase in pain to improve her ability to check blind spot while driving.  Baseline: R/L 55/54 degrees (04/13/23); R/L 72/72 degrees (05/17/2023);  Goal status: MET  4.  Patient will demonstrate improved grip strength by 5lbs each in both UE to demonstrate improvement in B UE strength and ability to use hands for manual tasks such as working.  Baseline: to be measured visit 2 as appropriate (04/13/23); met - see objective (05/17/2023);  Goal status: MET   5.  Patient will complete community, work and/or recreational activities with 50% less limitation due to current condition.  Baseline: difficulty with activities that require use B UE, activities with fine motor control, sorting mail/paperwork, crochet, crafting, working, activities with awkward or prolonged postures (04/13/23); estimates 15-20% improvement (05/17/2023);   Goal status: In-progress   PLAN:  PT FREQUENCY: 1-2x/week  PT DURATION: 12 weeks  PLANNED INTERVENTIONS: Therapeutic exercises, Therapeutic activity, Neuromuscular re-education, Patient/Family education, Self Care, Joint mobilization, Dry Needling, Electrical stimulation, Spinal mobilization, Cryotherapy, Moist heat, Manual therapy, and Re-evaluation  PLAN FOR NEXT SESSION: progressive neck/posture/UE/functional strengthening, motor control, and ROM exercises as tolerated. Education. Manual therapy and dry needling as needed. Neurodynamics as appropriate.    Cira Rue, PT, DPT 06/29/2023, 4:18 PM  [facility] [address] [place], [place] [address] P: [phone] I F: [phone]

## 2023-07-01 ENCOUNTER — Encounter: Payer: Commercial Managed Care - HMO | Admitting: Physical Therapy

## 2023-07-02 ENCOUNTER — Inpatient Hospital Stay: Payer: Commercial Managed Care - HMO

## 2023-07-02 ENCOUNTER — Other Ambulatory Visit: Payer: Self-pay | Admitting: *Deleted

## 2023-07-02 DIAGNOSIS — Z853 Personal history of malignant neoplasm of breast: Secondary | ICD-10-CM | POA: Diagnosis not present

## 2023-07-02 DIAGNOSIS — R7401 Elevation of levels of liver transaminase levels: Secondary | ICD-10-CM

## 2023-07-02 LAB — CBC WITH DIFFERENTIAL/PLATELET
Abs Immature Granulocytes: 0.02 10*3/uL (ref 0.00–0.07)
Basophils Absolute: 0.1 10*3/uL (ref 0.0–0.1)
Basophils Relative: 1 %
Eosinophils Absolute: 0.2 10*3/uL (ref 0.0–0.5)
Eosinophils Relative: 2 %
HCT: 39.8 % (ref 36.0–46.0)
Hemoglobin: 13.5 g/dL (ref 12.0–15.0)
Immature Granulocytes: 0 %
Lymphocytes Relative: 16 %
Lymphs Abs: 1.3 10*3/uL (ref 0.7–4.0)
MCH: 32.1 pg (ref 26.0–34.0)
MCHC: 33.9 g/dL (ref 30.0–36.0)
MCV: 94.8 fL (ref 80.0–100.0)
Monocytes Absolute: 0.6 10*3/uL (ref 0.1–1.0)
Monocytes Relative: 7 %
Neutro Abs: 5.9 10*3/uL (ref 1.7–7.7)
Neutrophils Relative %: 74 %
Platelets: 231 10*3/uL (ref 150–400)
RBC: 4.2 MIL/uL (ref 3.87–5.11)
RDW: 12.5 % (ref 11.5–15.5)
WBC: 8 10*3/uL (ref 4.0–10.5)
nRBC: 0 % (ref 0.0–0.2)

## 2023-07-02 LAB — COMPREHENSIVE METABOLIC PANEL
ALT: 46 U/L — ABNORMAL HIGH (ref 0–44)
AST: 43 U/L — ABNORMAL HIGH (ref 15–41)
Albumin: 4.2 g/dL (ref 3.5–5.0)
Alkaline Phosphatase: 75 U/L (ref 38–126)
Anion gap: 10 (ref 5–15)
BUN: 9 mg/dL (ref 6–20)
CO2: 21 mmol/L — ABNORMAL LOW (ref 22–32)
Calcium: 9.2 mg/dL (ref 8.9–10.3)
Chloride: 102 mmol/L (ref 98–111)
Creatinine, Ser: 0.77 mg/dL (ref 0.44–1.00)
GFR, Estimated: >60 mL/min (ref >60)
Glucose, Bld: 131 mg/dL — ABNORMAL HIGH (ref 70–99)
Potassium: 3.5 mmol/L (ref 3.5–5.1)
Sodium: 133 mmol/L — ABNORMAL LOW (ref 135–145)
Total Bilirubin: 0.6 mg/dL (ref 0.3–1.2)
Total Protein: 7 g/dL (ref 6.5–8.1)

## 2023-07-02 MED ORDER — SODIUM CHLORIDE 0.9% FLUSH
10.0000 mL | Freq: Once | INTRAVENOUS | Status: AC
  Administered 2023-07-02: 10 mL via INTRAVENOUS
  Filled 2023-07-02: qty 10

## 2023-07-02 MED ORDER — HEPARIN SOD (PORK) LOCK FLUSH 100 UNIT/ML IV SOLN
500.0000 [IU] | Freq: Once | INTRAVENOUS | Status: AC
  Administered 2023-07-02: 500 [IU] via INTRAVENOUS
  Filled 2023-07-02: qty 5

## 2023-07-04 ENCOUNTER — Encounter: Payer: Self-pay | Admitting: Oncology

## 2023-07-06 ENCOUNTER — Ambulatory Visit: Payer: Commercial Managed Care - HMO | Attending: Medical | Admitting: Physical Therapy

## 2023-07-06 ENCOUNTER — Encounter: Payer: Self-pay | Admitting: Oncology

## 2023-07-06 DIAGNOSIS — M792 Neuralgia and neuritis, unspecified: Secondary | ICD-10-CM | POA: Diagnosis present

## 2023-07-06 DIAGNOSIS — R519 Headache, unspecified: Secondary | ICD-10-CM | POA: Insufficient documentation

## 2023-07-06 DIAGNOSIS — M542 Cervicalgia: Secondary | ICD-10-CM | POA: Diagnosis present

## 2023-07-06 DIAGNOSIS — M6281 Muscle weakness (generalized): Secondary | ICD-10-CM | POA: Insufficient documentation

## 2023-07-06 NOTE — Therapy (Signed)
OUTPATIENT PHYSICAL THERAPY TREATMENT   Patient Name: Vickie Mathis MRN: [id number] DOB:02-26-1991, 32 y.o., female Today's Date: 07/06/2023  END OF SESSION:  PT End of Session - 07/06/23 1612     Visit Number 17    Number of Visits 23    Date for PT Re-Evaluation 07/06/23    Authorization Type [employer] reporting period from 05/17/2023    Progress Note Due on Visit 20    PT Start Time 1606    PT Stop Time 1644    PT Time Calculation (min) 38 min    Activity Tolerance Patient tolerated treatment well    Behavior During Therapy [facility] for tasks assessed/performed                Past Medical History:  Diagnosis Date   Anxiety    Asthma    Breast cancer (HCC) 11/2020   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 AXILLARY NODE DISSECTION;  Surgeon: Carolan Shiver, MD;  Location: [facility];  Service: General;  Laterality: Bilateral;  [place], 1.5 hours Cintron-Diaz 2.5 hours   BREAST BIOPSY Left 11/26/2020   vision 12:00 6cmfn [facility]   BREAST BIOPSY Left 11/26/2020   LN bx, hydro marker,  fragments of macrometastatic carcinoma   BREAST RECONSTRUCTION WITH PLACEMENT OF TISSUE EXPANDER AND FLEX HD (ACELLULAR HYDRATED DERMIS) Bilateral 06/25/2021   Procedure: BREAST RECONSTRUCTION WITH PLACEMENT OF TISSUE EXPANDER AND FLEX HD (ACELLULAR HYDRATED DERMIS);  Surgeon: Peggye Form, DO;  Location: [facility];  Service: Plastics;  Laterality: Bilateral;   PORTACATH PLACEMENT Right 12/13/2020   Procedure: INSERTION PORT-A-CATH;  Surgeon: Carolan Shiver, MD;  Location: [facility];  Service: General;  Laterality: Right;    REMOVAL OF BILATERAL TISSUE EXPANDERS WITH PLACEMENT OF BILATERAL BREAST IMPLANTS Bilateral 09/15/2021   Procedure: REMOVAL OF BILATERAL TISSUE EXPANDERS;  Surgeon: Peggye Form, DO;  Location: [facility];  Service: Plastics;  Laterality: Bilateral;   Patient Active Problem List   Diagnosis Date Noted   Chemotherapy-induced neuropathy (HCC) 02/06/2022   Right sided weakness 02/06/2022   Transaminitis 11/20/2021   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 10/25/2021   Malignant neoplasm of left breast in female, estrogen receptor positive (HCC) 06/25/2021   Genetic testing 01/15/2021   Family history of cancer    Invasive carcinoma of breast (HCC) 12/03/2020   Breast mass, left 07/01/2020   Situational mixed anxiety and depressive disorder 07/01/2020   Anxiety state 08/06/2014   Varicose veins of lower extremity 08/06/2014    PCP: Evelena Peat, PA  REFERRING PROVIDER: Evelena Peat, [place]  REFERRING DIAG: migraine without aura and without status migrainosus, not intractable   THERAPY DIAG:  Cervicalgia  Neuralgia and neuritis  Muscle weakness (generalized)  Nonintractable headache, unspecified chronicity pattern, unspecified headache type  Rationale for Evaluation and Treatment: Rehabilitation  ONSET DATE: neck pain chronic over many years, hand paresthesia onset with cancer treatment in 2022  PERTINENT HISTORY:  Patient is a 32 y.o. female who presents to outpatient physical therapy with a referral for medical diagnosis migraine without aura and without status migrainosus, not intractable. This patient's  chief complaints consist of chronic neck pain (R > L) and migriane headaches and bilateral hand/forearm paresthesia (R > L, C8/ulnar nerve distribution worse) leading to the following functional deficits: difficulty with activities that require use B UE, activities with fine motor control, sorting mail/paperwork, crochet, crafting,  working, activities with awkward or prolonged postures. 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  Patient denies hx of stroke, seizures, lung problems, heart problems, diabetes, unexplained weight loss, unexplained changes in bowel or bladder problems, osteoporosis, and spinal surgery.   SUBJECTIVE:                                                                                                                                                                                                         SUBJECTIVE STATEMENT: Patient states she was really sore for a day and a half after last PT session. She states it was more generalized soreness than neck pain. 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   PAIN:  NPRS: 3-4/10 bilateral upper cervical spine and upper traps when tipping head   PATIENT GOALS: Learning how to manage symptoms or manage them better. Strength. To get better. To control it so it doesn't get any worse.    TODAY'S TREATMENT:   Therapeutic exercise: to centralize symptoms and improve ROM, strength, muscular endurance, and activity tolerance required for successful completion of functional activities.  - seated cervical retraction with self overpressure, 2x10 (towel roll at lumbar spine).  - Upper body ergometer level 7 to encourage joint nutrition, warm tissue, induce analgesic effect of aerobic exercise, improve muscular strength and endurance,  and prepare for remainder of session. 5 min.  - prone lat pull on total gym at level 10, 2x10 -  squat to overhead press, 2x10 tapping 17 inch chair and using 5#DB.  - inclined bilateral scaption, laying on Total Gym at level 26, 2x10 AROM.  - chin tuck with lift, deep neck flexor endurance, 3x40 seconds - education about exercise parameters for strength training including handouts  Pt required multimodal cuing for proper technique and to facilitate improved neuromuscular control, strength, range of motion, and functional ability resulting in improved performance and form.  PATIENT EDUCATION:  Education details: Exercise purpose/form.  Self management techniques. Education on diagnosis, prognosis, POC, anatomy and physiology of current condition Education on HEP including handout  Person educated: Patient Education method: Explanation, Demonstration, Tactile cues, Verbal cues, and Handouts Education comprehension: verbalized understanding, returned demonstration, and needs further education  HOME EXERCISE PROGRAM: Access Code: [email or web address] URL: [email or web address] Date: 05/06/2023 Prepared by: Norton Blizzard  Exercises - Standing Bilateral Low Shoulder Row with Anchored Resistance  - 1 x daily - 3 sets - 10 reps - Sidelying Thoracic Rotation with Open Book  - 1 x daily - 1-2 sets - 10 reps - 5 seconds/1 breath hold - Prone W Scapular Retraction  - 3-5 x weekly - 3 sets - 10 reps - Neutral Curl Up with Arms Across Chest  - 1 x daily - 3-5 x weekly - 5 sets - 30 seconds hold  HOME EXERCISE PROGRAM [[id number]] View at [email or web address]" using code: [id number] Cervical Retraction in Sitting w/OP - MDT -  Repeat 20 Repetitions, Hold 1 Second(s), Complete 1 Set, Perform 8 Times a Day  ASSESSMENT:  CLINICAL IMPRESSION: Patient arrives reporting significant soreness after last PT session but eager to continue strengthening. Today's session focused on continuing postural and shoulder girdle strengthening to improve functional activity tolerance and help her meet her return to work  goals. She also was educated about exercise dosing to help her be able to continue strengthening effectively after discharging from physical therapy. Patient tolerated session well overall. Exercises were minimally progressed today due to high level of soreness following last PT session. Patient would benefit from continued management of limiting condition by skilled physical therapist to address remaining impairments and functional limitations to work towards stated goals and return to PLOF or maximal functional independence.   From Initial PT Evaluation 04/13/2023:  Patient is a 32 y.o. female referred to outpatient physical therapy with a medical diagnosis of migraine without aura and without status migrainosus, not intractable who presents with signs and symptoms consistent with chronic neck pain and intermittent migraines bilateral hand and forearm paresthesia in glove pattern with worst symptoms at 5th digit. Patient's hand symptoms continue to seem best explained as chemotherapy-induced peripheral neuropathy which is often unable to be detected on nerve conduction testing despite being clinically present due to the fine nerves being affected, while nerve conduction studies are only able to measure changes in large myelinated nerve fibers. Unable to make connection between neck and hand symptoms during exam. Will continue to be alert for signs of radiculopathy and address accordingly. Patient has potential for improved hand strength and control with physical therapy despite the presence of paresthesia. Upon exam, patient was very tender to palpation at suboccipital region which can be a source of cervicogenic contribution to headache or migraine trigger.  Patient presents with significant pain, ROM, joint stiffness, muscle tension, paresthesia, motor control, muscle performance (strength/power/endurance) and activity tolerance impairments that are limiting ability to complete her usual activities such as  those that require use B UE, activities with fine motor control, sorting mail/paperwork, crochet, crafting, working, activities with awkward or prolonged postures without difficulty. Patient will benefit from skilled physical therapy intervention to address current body structure impairments and activity limitations to improve function and work towards goals set in current POC in order to return to prior level of function or maximal functional improvement.   OBJECTIVE IMPAIRMENTS: decreased activity tolerance, decreased coordination, decreased endurance, decreased knowledge of condition, decreased ROM, decreased strength, impaired perceived functional ability, increased muscle spasms, impaired flexibility, impaired sensation, impaired UE functional  use, and pain.   ACTIVITY LIMITATIONS: hygiene/grooming and   difficulty with activities that require use B UE, activities with fine motor control, sorting mail/paperwork, crochet, crafting, working, activities with awkward or prolonged postures  PARTICIPATION LIMITATIONS: meal prep, cleaning, interpersonal relationship, and occupation  PERSONAL FACTORS: Past/current experiences, Profession, Time since onset of injury/illness/exacerbation, and 3+ comorbidities:   Anxiety state; Breast mass, left; Situational mixed anxiety and depressive disorder; Varicose veins of lower extremity; Invasive carcinoma of breast (2022);  Malignant neoplasm of left breast in female, estrogen receptor positive (HCC); 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   REHAB POTENTIAL: Good  CLINICAL DECISION MAKING: Evolving/moderate complexity  EVALUATION COMPLEXITY: Moderate    GOALS: Goals reviewed with patient? Yes  SHORT TERM GOALS: Target date: 04/27/2023  Patient will be independent with initial home exercise  program for self-management of symptoms. Baseline: Initial HEP provided at IE (04/13/23); Goal status: MET   LONG TERM GOALS: Target date: 07/06/2023  Patient will be independent with a long-term home exercise program for self-management of symptoms.  Baseline: Initial HEP provided at IE (04/13/23); currently participating well (05/17/2023);  Goal status: In-progress  2.  Patient will demonstrate improved FOTO to equal or greater than 69 by visit #10 to demonstrate improvement in overall condition and self-reported functional ability.  Baseline: 59 (04/13/23); 59 at visit #10 (05/17/2023);  Goal status: In-progress  3.  Patient will demonstrate cervical spine rotation equal or greater than 60 degrees bilaterally without increase in pain to improve her ability to check blind spot while driving.  Baseline: R/L 55/54 degrees (04/13/23); R/L 72/72 degrees (05/17/2023);  Goal status: MET  4.  Patient will demonstrate improved grip strength by 5lbs each in both UE to demonstrate improvement in B UE strength and ability to use hands for manual tasks such as working.  Baseline: to be measured visit 2 as appropriate (04/13/23); met - see objective (05/17/2023);  Goal status: MET   5.  Patient will complete community, work and/or recreational activities with 50% less limitation due to current condition.  Baseline: difficulty with activities that require use B UE, activities with fine motor control, sorting mail/paperwork, crochet, crafting, working, activities with awkward or prolonged postures (04/13/23); estimates 15-20% improvement (05/17/2023);  Goal status: In-progress   PLAN:  PT FREQUENCY: 1-2x/week  PT DURATION: 12 weeks  PLANNED INTERVENTIONS: Therapeutic exercises, Therapeutic activity, Neuromuscular re-education, Patient/Family education, Self Care, Joint mobilization, Dry Needling, Electrical stimulation, Spinal mobilization, Cryotherapy, Moist heat, Manual therapy, and  Re-evaluation  PLAN FOR NEXT SESSION: progressive neck/posture/UE/functional strengthening, motor control, and ROM exercises as tolerated. Education. Manual therapy and dry needling as needed. Neurodynamics as appropriate.    Cira Rue, PT, DPT 07/06/2023, 6:49 PM  [facility] [address] [place], [place] [address] P: [phone] I F: [phone]

## 2023-07-09 ENCOUNTER — Encounter: Payer: Self-pay | Admitting: Oncology

## 2023-07-14 ENCOUNTER — Ambulatory Visit: Payer: Commercial Managed Care - HMO | Admitting: Physical Therapy

## 2023-07-16 ENCOUNTER — Inpatient Hospital Stay: Payer: Commercial Managed Care - HMO

## 2023-07-21 ENCOUNTER — Ambulatory Visit: Payer: Commercial Managed Care - HMO | Admitting: Physical Therapy

## 2023-07-21 DIAGNOSIS — R519 Headache, unspecified: Secondary | ICD-10-CM

## 2023-07-21 DIAGNOSIS — M792 Neuralgia and neuritis, unspecified: Secondary | ICD-10-CM

## 2023-07-21 DIAGNOSIS — M542 Cervicalgia: Secondary | ICD-10-CM

## 2023-07-21 DIAGNOSIS — M6281 Muscle weakness (generalized): Secondary | ICD-10-CM

## 2023-07-21 NOTE — Therapy (Signed)
OUTPATIENT PHYSICAL THERAPY TREATMENT / PROGRESS NOTE / RE-CERTIFICATION Dates of reporting from 05/17/2023 to 07/21/2023   Patient Name: Vickie Mathis MRN: [id number] DOB:16-Jan-1991, 32 y.o., female Today's Date: 07/21/2023  END OF SESSION:  PT End of Session - 07/21/23 1826     Visit Number 18    Number of Visits 25    Date for PT Re-Evaluation 08/18/23    Authorization Type [employer] reporting period from 05/17/2023    Progress Note Due on Visit 20    PT Start Time 1735    PT Stop Time 1818    PT Time Calculation (min) 43 min    Activity Tolerance Patient tolerated treatment well    Behavior During Therapy [facility] for tasks assessed/performed                 Past Medical History:  Diagnosis Date   Anxiety    Asthma    Breast cancer (HCC) 11/2020   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 AXILLARY NODE DISSECTION;  Surgeon: Carolan Shiver, MD;  Location: [facility];  Service: General;  Laterality: Bilateral;  [place], 1.5 hours [place]Diaz 2.5 hours   BREAST BIOPSY Left 11/26/2020   vision 12:00 6cmfn [facility]   BREAST BIOPSY Left 11/26/2020   LN bx, hydro marker,  fragments of macrometastatic carcinoma   BREAST RECONSTRUCTION WITH PLACEMENT OF TISSUE EXPANDER AND FLEX HD (ACELLULAR HYDRATED DERMIS) Bilateral 06/25/2021   Procedure: BREAST RECONSTRUCTION WITH PLACEMENT OF TISSUE EXPANDER AND FLEX HD (ACELLULAR HYDRATED DERMIS);  Surgeon: Peggye Form, DO;  Location: [facility];  Service: Plastics;  Laterality: Bilateral;   PORTACATH PLACEMENT Right 12/13/2020   Procedure: INSERTION PORT-A-CATH;  Surgeon:  Carolan Shiver, MD;  Location: [facility];  Service: General;  Laterality: Right;   REMOVAL OF BILATERAL TISSUE EXPANDERS WITH PLACEMENT OF BILATERAL BREAST IMPLANTS Bilateral 09/15/2021   Procedure: REMOVAL OF BILATERAL TISSUE EXPANDERS;  Surgeon: Peggye Form, DO;  Location: [facility];  Service: Plastics;  Laterality: Bilateral;   Patient Active Problem List   Diagnosis Date Noted   Chemotherapy-induced neuropathy (HCC) 02/06/2022   Right sided weakness 02/06/2022   Transaminitis 11/20/2021   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 10/25/2021   Malignant neoplasm of left breast in female, estrogen receptor positive (HCC) 06/25/2021   Genetic testing 01/15/2021   Family history of cancer    Invasive carcinoma of breast (HCC) 12/03/2020   Breast mass, left 07/01/2020   Situational mixed anxiety and depressive disorder 07/01/2020   Anxiety state 08/06/2014   Varicose veins of lower extremity 08/06/2014    PCP: Evelena Peat, PA  REFERRING PROVIDER: Evelena Peat, [place]  REFERRING DIAG: migraine without aura and without status migrainosus, not intractable   THERAPY DIAG:  Cervicalgia  Neuralgia and neuritis  Muscle weakness (generalized)  Nonintractable headache, unspecified chronicity pattern, unspecified headache type  Rationale for Evaluation and Treatment: Rehabilitation  ONSET DATE: neck pain chronic over many years, hand paresthesia onset with cancer treatment in 2022  PERTINENT HISTORY:  Patient is a 32 y.o. female who presents to outpatient physical therapy with a referral for  medical diagnosis migraine without aura and without status migrainosus, not intractable. This patient's chief complaints consist of chronic neck pain (R > L) and migriane headaches and bilateral hand/forearm paresthesia (R > L, C8/ulnar nerve distribution worse) leading to the following functional deficits: difficulty with activities that require use B  UE, activities with fine motor control, sorting mail/paperwork, crochet, crafting, working, activities with awkward or prolonged postures. 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  Patient denies hx of stroke, seizures, lung problems, heart problems, diabetes, unexplained weight loss, unexplained changes in bowel or bladder problems, osteoporosis, and spinal surgery.   SUBJECTIVE:                                                                                                                                                                                                         SUBJECTIVE STATEMENT: Patient states she has noticed her left medial forearm and distal medial upper arm. It has hurt for a couple of days with no worsening and no provoking factor except maybe more crochet. She is right handed, but had cancer in left breast. She states her neck has not been too bad. She has not been as kind to herself this last week and not as diligent. She states work has been going okay. She just went up to 5 hours a day yesterday but she missed work yesterday and was off today. She states she has been tolerating her 4 hours okay, but she wishes they would start her earlier. She missed last PT session because she was too tired to make it to PT that late in the day. She states she has not been having as much motor control problems with her hands.   PAIN:  NPRS: 3/10 bilateral upper cervical spine and left arm even when sitting.   PATIENT GOALS: Learning how to manage symptoms or manage them better. Strength. To get  better. To control it so it doesn't get any worse.    OBJECTIVE  SELF-REPORTED FUNCTION FOTO score: 66/100 (neck questionnaire)   SPINE MOTION CERVICAL SPINE AROM (prior to repeated motions) Rotation:  R 78 soreness in the right upper trap L 72 mild soreness in left upper trap   Grip strength (in pounds, average of three measures).  R: (85+90+86)/3 =  87 L: (95+86+95)/3 = 92  TODAY'S TREATMENT:   Therapeutic exercise: to centralize symptoms and improve ROM, strength, muscular endurance, and activity tolerance required for successful completion of functional activities.   - Upper body ergometer level 7 to encourage joint nutrition, warm tissue, induce analgesic effect of aerobic exercise, improve muscular strength and endurance,  and prepare for remainder of session. 5 min.  (Manual therapy / Dry needling - see below) - seated cervical retraction with self overpressure, 2x10 (towel roll at lumbar spine). - measurements to assess progress (see above)  Manual therapy: to reduce pain and tissue tension, improve range of motion, neuromodulation, in order to promote improved ability to complete functional activities. PRONE - STM to bilateral upper traps, cervical spine paraspinals, suboccipital muscles  Modality: Dry needling performed to bilateral upper traps to decrease pain and spasms along patient's neck and arm region with patient in prone utilizing 2 dry needle(s) .25mm x 40mm with 2-3 sticks at each upper trap. Patient educated about the risks and benefits from therapy and verbally consents to treatment.  Dry needling performed by Luretha Murphy. Ilsa Iha PT, DPT who is certified in this technique.   Pt required multimodal cuing for proper technique and to facilitate improved neuromuscular control, strength, range of motion, and functional ability resulting in improved performance and form.  PATIENT EDUCATION:  Education details: Exercise purpose/form. Self management techniques. Education  on diagnosis, prognosis, POC, anatomy and physiology of current condition Education on HEP including handout  Person educated: Patient Education method: Explanation, Demonstration, Tactile cues, Verbal cues, and Handouts Education comprehension: verbalized understanding, returned demonstration, and needs further education  HOME EXERCISE PROGRAM: Access Code: [email or web address] URL: [email or web address] Date: 05/06/2023 Prepared by: Norton Blizzard  Exercises - Standing Bilateral Low Shoulder Row with Anchored Resistance  - 1 x daily - 3 sets - 10 reps - Sidelying Thoracic Rotation with Open Book  - 1 x daily - 1-2 sets - 10 reps - 5 seconds/1 breath hold - Prone W Scapular Retraction  - 3-5 x weekly - 3 sets - 10 reps - Neutral Curl Up with Arms Across Chest  - 1 x daily - 3-5 x weekly - 5 sets - 30 seconds hold  HOME EXERCISE PROGRAM [[id number]] View at "[email or web address]" using code: [id number] Cervical Retraction in Sitting w/OP - MDT -  Repeat 20 Repetitions, Hold 1 Second(s), Complete 1 Set, Perform 8 Times a Day  ASSESSMENT:  CLINICAL IMPRESSION: Patient has attended 18 physical therapy session since starting current episode of care on 04/13/2023. She has made good progress towards her goals, meeting or nearly meeting most of her goals. She has not yet met her long term HEP, grip strength goal, or FOTO score goal but is approaching them. Plan to continue with physical therapy at a rate of 1-2/week to continue improving activity tolerance and strength with plan to discharge within the next 5 weeks. Patient would benefit from continued management of limiting condition by skilled physical therapist to address remaining impairments and functional limitations to work towards stated goals and return to PLOF or maximal functional independence.   From Initial PT Evaluation 04/13/2023:  Patient is a 32 y.o. female referred to outpatient physical therapy with a medical diagnosis of migraine without  aura and without status migrainosus, not intractable who presents with signs and symptoms consistent with chronic neck pain and intermittent migraines bilateral hand and forearm paresthesia in glove pattern with worst symptoms at 5th digit. Patient's hand symptoms continue to seem best explained as chemotherapy-induced  peripheral neuropathy which is often unable to be detected on nerve conduction testing despite being clinically present due to the fine nerves being affected, while nerve conduction studies are only able to measure changes in large myelinated nerve fibers. Unable to make connection between neck and hand symptoms during exam. Will continue to be alert for signs of radiculopathy and address accordingly. Patient has potential for improved hand strength and control with physical therapy despite the presence of paresthesia. Upon exam, patient was very tender to palpation at suboccipital region which can be a source of cervicogenic contribution to headache or migraine trigger.  Patient presents with significant pain, ROM, joint stiffness, muscle tension, paresthesia, motor control, muscle performance (strength/power/endurance) and activity tolerance impairments that are limiting ability to complete her usual activities such as those that require use B UE, activities with fine motor control, sorting mail/paperwork, crochet, crafting, working, activities with awkward or prolonged postures without difficulty. Patient will benefit from skilled physical therapy intervention to address current body structure impairments and activity limitations to improve function and work towards goals set in current POC in order to return to prior level of function or maximal functional improvement.   OBJECTIVE IMPAIRMENTS: decreased activity tolerance, decreased coordination, decreased endurance, decreased knowledge of condition, decreased ROM, decreased strength, impaired perceived functional ability, increased muscle  spasms, impaired flexibility, impaired sensation, impaired UE functional use, and pain.   ACTIVITY LIMITATIONS: hygiene/grooming and   difficulty with activities that require use B UE, activities with fine motor control, sorting mail/paperwork, crochet, crafting, working, activities with awkward or prolonged postures  PARTICIPATION LIMITATIONS: meal prep, cleaning, interpersonal relationship, and occupation  PERSONAL FACTORS: Past/current experiences, Profession, Time since onset of injury/illness/exacerbation, and 3+ comorbidities:   Anxiety state; Breast mass, left; Situational mixed anxiety and depressive disorder; Varicose veins of lower extremity; Invasive carcinoma of breast (2022);  Malignant neoplasm of left breast in female, estrogen receptor positive (HCC); 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   REHAB POTENTIAL: Good  CLINICAL DECISION MAKING: Evolving/moderate complexity  EVALUATION COMPLEXITY: Moderate    GOALS: Goals reviewed with patient? Yes  SHORT TERM GOALS: Target date: 04/27/2023  Patient will be independent with initial home exercise program for self-management of symptoms. Baseline: Initial HEP provided at IE (04/13/23); Goal status: MET   LONG TERM GOALS: Target date: 07/06/2023. Target date updated to 08/18/2023 for all unmet goals on 07/21/2023.   Patient will be independent with a long-term home exercise program for self-management of symptoms.  Baseline: Initial HEP provided at IE (04/13/23); currently participating well (05/17/2023); difficulty with participation over the last two weeks (07/21/2023);  Goal status: In-progress  2.  Patient will demonstrate improved FOTO to equal or greater than 69 by visit #10 to demonstrate improvement in overall condition and self-reported functional ability.   Baseline: 59 (04/13/23); 59 at visit #10 (05/17/2023); 66 at visit #18 (07/21/2023);  Goal status: Nearly met  3.  Patient will demonstrate cervical spine rotation equal or greater than 60 degrees bilaterally without increase in pain to improve her ability to check blind spot while driving.  Baseline: R/L 55/54 degrees (04/13/23); R/L 72/72 degrees (05/17/2023); R/L 78/72 degrees (07/21/2023);  Goal status: MET  4.  Patient will demonstrate improved grip strength by 5lbs each in both UE to demonstrate improvement in B UE strength and ability to use hands for manual tasks such as working.  Baseline: to be measured  visit 2 as appropriate (04/13/23); met - see objective (05/17/2023); R/L 84.3/78.3 lbs (04/15/2023); R/L 101.7/94 lbs (05/17/2023); R/L 87/92 lbs (07/21/2023);  Goal status: Nearly met  5.  Patient will complete community, work and/or recreational activities with 50% less limitation due to current condition.  Baseline: difficulty with activities that require use B UE, activities with fine motor control, sorting mail/paperwork, crochet, crafting, working, activities with awkward or prolonged postures (04/13/23); estimates 15-20% improvement (05/17/2023); estimates 50% or more improvement (07/21/2023);  Goal status: MET   PLAN:  PT FREQUENCY: 1-2x/week  PT DURATION: 12 weeks  PLANNED INTERVENTIONS: Therapeutic exercises, Therapeutic activity, Neuromuscular re-education, Patient/Family education, Self Care, Joint mobilization, Dry Needling, Electrical stimulation, Spinal mobilization, Cryotherapy, Moist heat, Manual therapy, and Re-evaluation  PLAN FOR NEXT SESSION: progressive neck/posture/UE/functional strengthening, motor control, and ROM exercises as tolerated. Education. Manual therapy and dry needling as needed. Neurodynamics as appropriate.    Cira Rue, PT, DPT 07/21/2023, 6:33 PM  [facility] [address] [place], [place] [address] P:  [phone] I F: [phone]

## 2023-07-22 ENCOUNTER — Encounter: Payer: Self-pay | Admitting: Oncology

## 2023-07-26 ENCOUNTER — Ambulatory Visit
Admission: RE | Admit: 2023-07-26 | Discharge: 2023-07-26 | Disposition: A | Discharge status: Home / Self Care | Payer: Commercial Managed Care - HMO | Source: Ambulatory Visit | Attending: Family Medicine | Admitting: Family Medicine

## 2023-07-26 ENCOUNTER — Other Ambulatory Visit: Payer: Self-pay | Admitting: Family Medicine

## 2023-07-26 ENCOUNTER — Ambulatory Visit: Payer: Commercial Managed Care - HMO | Admitting: Physical Therapy

## 2023-07-26 DIAGNOSIS — M79602 Pain in left arm: Secondary | ICD-10-CM | POA: Diagnosis present

## 2023-07-29 ENCOUNTER — Encounter: Payer: Self-pay | Admitting: Physical Therapy

## 2023-07-29 ENCOUNTER — Ambulatory Visit: Payer: Commercial Managed Care - HMO | Admitting: Physical Therapy

## 2023-07-29 DIAGNOSIS — R519 Headache, unspecified: Secondary | ICD-10-CM

## 2023-07-29 DIAGNOSIS — M6281 Muscle weakness (generalized): Secondary | ICD-10-CM

## 2023-07-29 DIAGNOSIS — M542 Cervicalgia: Secondary | ICD-10-CM | POA: Diagnosis not present

## 2023-07-29 DIAGNOSIS — M792 Neuralgia and neuritis, unspecified: Secondary | ICD-10-CM

## 2023-07-29 NOTE — Therapy (Addendum)
OUTPATIENT PHYSICAL THERAPY TREATMENT   Patient Name: Vickie Mathis MRN: [id number] DOB:Apr 13, 1991, 32 y.o., female Today's Date: 07/29/2023  END OF SESSION:  PT End of Session - 07/29/23 1711     Visit Number 19    Number of Visits 25    Date for PT Re-Evaluation 08/18/23    Authorization Type [employer] reporting period from 05/17/2023    Progress Note Due on Visit 20    PT Start Time 1651    PT Stop Time 1735    PT Time Calculation (min) 44 min    Activity Tolerance Patient tolerated treatment well    Behavior During Therapy [facility] for tasks assessed/performed                  Past Medical History:  Diagnosis Date   Anxiety    Asthma    Breast cancer (HCC) 11/2020   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 AXILLARY NODE DISSECTION;  Surgeon: Carolan Shiver, MD;  Location: [facility];  Service: General;  Laterality: Bilateral;  [place], 1.5 hours Cintron-Diaz 2.5 hours   BREAST BIOPSY Left 11/26/2020   vision 12:00 6cmfn [facility]   BREAST BIOPSY Left 11/26/2020   LN bx, hydro marker,  fragments of macrometastatic carcinoma   BREAST RECONSTRUCTION WITH PLACEMENT OF TISSUE EXPANDER AND FLEX HD (ACELLULAR HYDRATED DERMIS) Bilateral 06/25/2021   Procedure: BREAST RECONSTRUCTION WITH PLACEMENT OF TISSUE EXPANDER AND FLEX HD (ACELLULAR HYDRATED DERMIS);  Surgeon: Peggye Form, DO;  Location: [facility];  Service: Plastics;  Laterality: Bilateral;   PORTACATH PLACEMENT Right 12/13/2020   Procedure: INSERTION PORT-A-CATH;  Surgeon: Carolan Shiver, MD;  Location: [facility];  Service: General;  Laterality: Right;    REMOVAL OF BILATERAL TISSUE EXPANDERS WITH PLACEMENT OF BILATERAL BREAST IMPLANTS Bilateral 09/15/2021   Procedure: REMOVAL OF BILATERAL TISSUE EXPANDERS;  Surgeon: Peggye Form, DO;  Location: [facility];  Service: Plastics;  Laterality: Bilateral;   Patient Active Problem List   Diagnosis Date Noted   Chemotherapy-induced neuropathy (HCC) 02/06/2022   Right sided weakness 02/06/2022   Transaminitis 11/20/2021   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 10/25/2021   Malignant neoplasm of left breast in female, estrogen receptor positive (HCC) 06/25/2021   Genetic testing 01/15/2021   Family history of cancer    Invasive carcinoma of breast (HCC) 12/03/2020   Breast mass, left 07/01/2020   Situational mixed anxiety and depressive disorder 07/01/2020   Anxiety state 08/06/2014   Varicose veins of lower extremity 08/06/2014    PCP: Evelena Peat, PA  REFERRING PROVIDER: Evelena Peat, [place]  REFERRING DIAG: migraine without aura and without status migrainosus, not intractable   THERAPY DIAG:  Cervicalgia  Neuralgia and neuritis  Muscle weakness (generalized)  Nonintractable headache, unspecified chronicity pattern, unspecified headache type  Rationale for Evaluation and Treatment: Rehabilitation  ONSET DATE: neck pain chronic over many years, hand paresthesia onset with cancer treatment in 2022  PERTINENT HISTORY:  Patient is a 32 y.o. female who presents to outpatient physical therapy with a referral for medical diagnosis migraine without aura and without status migrainosus, not intractable.  This patient's chief complaints consist of chronic neck pain (R > L) and migriane headaches and bilateral hand/forearm paresthesia (R > L, C8/ulnar nerve distribution worse) leading to the following functional deficits: difficulty with activities that require use B UE, activities with fine motor control, sorting mail/paperwork, crochet, crafting,  working, activities with awkward or prolonged postures. 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  Patient denies hx of stroke, seizures, lung problems, heart problems, diabetes, unexplained weight loss, unexplained changes in bowel or bladder problems, osteoporosis, and spinal surgery.   SUBJECTIVE:                                                                                                                                                                                                         SUBJECTIVE STATEMENT: Pt. States diagnosis of cellulitis on 07/26/23 along left arm.  She felt swelling, redness, and pain, but testing confirmed no DVT and she has been off work since that happened. She is starting work Advertising account executive.  She has been doing her home exercises occasionally but not working. She reports right sided neck pain with right cervical rotation.  She reports taking antibiotics and is feeling better, but wants to take it easy as she is still healing.  PAIN:  NPRS: 4/10 right sided upper cervical spine into the shoulder (along the right upper trap).   PATIENT GOALS: Learning how to manage symptoms or manage them better. Strength. To get better. To control it so it doesn't get any worse.    OBJECTIVE   Manual therapy: to reduce pain and tissue tension, improve range of motion, neuromodulation, in order to promote improved ability to complete functional activities. SUPINE Suboccipital release: 3x30 seconds. Pt. Reported some discomfort and reproduction on her headache that improved with  time.  Grade III CPAs on C3-C5; 2x30 seconds. Hypomobility was noted on C5, and pt. reported tenderness, especiallly at C5. Tenderness and hypomobility decreased throughout the mobilization.  R. Cervical rot PROM was done in between mobilization bouts. R. Cervical ROM increased and pain decreased post mobilization.    TODAY'S TREATMENT:   Therapeutic exercise: to centralize symptoms and improve ROM, strength, muscular endurance, and activity tolerance required for successful completion of functional activities.   - Upper body ergometer level 7 to  encourage joint nutrition, warm tissue, induce analgesic effect of aerobic exercise, improve muscular strength and endurance,  and prepare for remainder of session. 5 min.   Manual therapy: to reduce pain and tissue tension, improve range of motion, neuromodulation, in order to promote improved ability to complete functional activities. SUPINE - Suboccipital release: 3x30 seconds. Pt. Reported some discomfort and reproduction on her headache that improved with time. - Grade III CPAs on C3-C5; 2x30 seconds. Hypomobility was noted on C5, and pt. reported tenderness, especiallly at C5. Tenderness and hypomobility decreased throughout the mobilization.  R. Cervical rot PROM was done in between mobilization bouts. R. Cervical ROM increased and pain decreased post mobilization. PRONE - STM to bilateral upper traps and suboccipital muscles  Modality: Dry needling performed to bilateral upper traps and suboccipitals to decrease pain and spasms along patient's neck and arm region with patient in prone utilizing 2 dry needle(s) .25mm x 40mm with 1 stick each side in suboccipital muscles, and  1-2 sticks at each upper trap. Patient educated about the risks and benefits from therapy and verbally consents to treatment.  Dry needling performed by Luretha Murphy. Ilsa Iha PT, DPT who is certified in this technique.   Pt required multimodal cuing for proper technique and to  facilitate improved neuromuscular control, strength, range of motion, and functional ability resulting in improved performance and form.  PATIENT EDUCATION:  Education details: Exercise purpose/form. Self management techniques. Education on diagnosis, prognosis, POC, anatomy and physiology of current condition Education on HEP including handout  Person educated: Patient Education method: Explanation, Demonstration, Tactile cues, Verbal cues, and Handouts Education comprehension: verbalized understanding, returned demonstration, and needs further education  HOME EXERCISE PROGRAM: Access Code: [email or web address] URL: [email or web address] Date: 05/06/2023 Prepared by: Norton Blizzard  Exercises - Standing Bilateral Low Shoulder Row with Anchored Resistance  - 1 x daily - 3 sets - 10 reps - Sidelying Thoracic Rotation with Open Book  - 1 x daily - 1-2 sets - 10 reps - 5 seconds/1 breath hold - Prone W Scapular Retraction  - 3-5 x weekly - 3 sets - 10 reps - Neutral Curl Up with Arms Across Chest  - 1 x daily - 3-5 x weekly - 5 sets - 30 seconds hold  HOME EXERCISE PROGRAM [[id number]] View at [email or web address]" using code: [id number] Cervical Retraction in Sitting w/OP - MDT -  Repeat 20 Repetitions, Hold 1 Second(s), Complete 1 Set, Perform 8 Times a Day  ASSESSMENT:  CLINICAL IMPRESSION:  Pt. Responded well to suboccipital release, with pain and headache decreasing over time, suggesting that trigger points in that region are contributing to her headaches.  C5 was particularly sensitive to CPAs, but the pain reduction throughout the mob seemed to coincide with her reduced pain and increased ROM afterwards.  This suggests that mechanical neck pain is contributing to her ROM deficits and she would benefit from skilled PT intervention to continue working on her ROM, decrease pain, and increase functional ability.  Patient has attended 18 physical therapy session since starting current episode  of care on 04/13/2023. She has made good progress towards her goals, meeting or nearly meeting most of her goals. She has not yet met her long term HEP, grip strength goal, or FOTO score goal but is approaching them. Plan to continue with physical therapy at a rate of 1-2/week to continue improving activity tolerance and strength with plan to discharge within the next 5 weeks. Patient would benefit from continued management of limiting condition by skilled physical  therapist to address remaining impairments and functional limitations to work towards stated goals and return to PLOF or maximal functional independence.   From Initial PT Evaluation 04/13/2023:  Patient is a 32 y.o. female referred to outpatient physical therapy with a medical diagnosis of migraine without aura and without status migrainosus, not intractable who presents with signs and symptoms consistent with chronic neck pain and intermittent migraines bilateral hand and forearm paresthesia in glove pattern with worst symptoms at 5th digit. Patient's hand symptoms continue to seem best explained as chemotherapy-induced peripheral neuropathy which is often unable to be detected on nerve conduction testing despite being clinically present due to the fine nerves being affected, while nerve conduction studies are only able to measure changes in large myelinated nerve fibers. Unable to make connection between neck and hand symptoms during exam. Will continue to be alert for signs of radiculopathy and address accordingly. Patient has potential for improved hand strength and control with physical therapy despite the presence of paresthesia. Upon exam, patient was very tender to palpation at suboccipital region which can be a source of cervicogenic contribution to headache or migraine trigger.  Patient presents with significant pain, ROM, joint stiffness, muscle tension, paresthesia, motor control, muscle performance (strength/power/endurance) and activity  tolerance impairments that are limiting ability to complete her usual activities such as those that require use B UE, activities with fine motor control, sorting mail/paperwork, crochet, crafting, working, activities with awkward or prolonged postures without difficulty. Patient will benefit from skilled physical therapy intervention to address current body structure impairments and activity limitations to improve function and work towards goals set in current POC in order to return to prior level of function or maximal functional improvement.   OBJECTIVE IMPAIRMENTS: decreased activity tolerance, decreased coordination, decreased endurance, decreased knowledge of condition, decreased ROM, decreased strength, impaired perceived functional ability, increased muscle spasms, impaired flexibility, impaired sensation, impaired UE functional use, and pain.   ACTIVITY LIMITATIONS: hygiene/grooming and   difficulty with activities that require use B UE, activities with fine motor control, sorting mail/paperwork, crochet, crafting, working, activities with awkward or prolonged postures  PARTICIPATION LIMITATIONS: meal prep, cleaning, interpersonal relationship, and occupation  PERSONAL FACTORS: Past/current experiences, Profession, Time since onset of injury/illness/exacerbation, and 3+ comorbidities:   Anxiety state; Breast mass, left; Situational mixed anxiety and depressive disorder; Varicose veins of lower extremity; Invasive carcinoma of breast (2022);  Malignant neoplasm of left breast in female, estrogen receptor positive (HCC); 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   REHAB POTENTIAL: Good  CLINICAL DECISION MAKING: Evolving/moderate complexity  EVALUATION COMPLEXITY: Moderate    GOALS: Goals reviewed with patient? Yes  SHORT TERM  GOALS: Target date: 04/27/2023  Patient will be independent with initial home exercise program for self-management of symptoms. Baseline: Initial HEP provided at IE (04/13/23); Goal status: MET   LONG TERM GOALS: Target date: 07/06/2023. Target date updated to 08/18/2023 for all unmet goals on 07/21/2023.   Patient will be independent with a long-term home exercise program for self-management of symptoms.  Baseline: Initial HEP provided at IE (04/13/23); currently participating well (05/17/2023); difficulty with participation over the last two weeks (07/21/2023);  Goal status: In-progress  2.  Patient will demonstrate improved FOTO to equal or greater than 69 by visit #10 to demonstrate improvement in overall condition and self-reported functional ability.  Baseline: 59 (04/13/23); 59 at visit #10 (05/17/2023); 66 at visit #18 (  07/21/2023);  Goal status: Nearly met  3.  Patient will demonstrate cervical spine rotation equal or greater than 60 degrees bilaterally without increase in pain to improve her ability to check blind spot while driving.  Baseline: R/L 55/54 degrees (04/13/23); R/L 72/72 degrees (05/17/2023); R/L 78/72 degrees (07/21/2023);  Goal status: MET  4.  Patient will demonstrate improved grip strength by 5lbs each in both UE to demonstrate improvement in B UE strength and ability to use hands for manual tasks such as working.  Baseline: to be measured visit 2 as appropriate (04/13/23); met - see objective (05/17/2023); R/L 84.3/78.3 lbs (04/15/2023); R/L 101.7/94 lbs (05/17/2023); R/L 87/92 lbs (07/21/2023);  Goal status: Nearly met  5.  Patient will complete community, work and/or recreational activities with 50% less limitation due to current condition.  Baseline: difficulty with activities that require use B UE, activities with fine motor control, sorting mail/paperwork, crochet, crafting, working, activities with awkward or prolonged postures (04/13/23); estimates 15-20%  improvement (05/17/2023); estimates 50% or more improvement (07/21/2023);  Goal status: MET   PLAN:  PT FREQUENCY: 1-2x/week  PT DURATION: 12 weeks  PLANNED INTERVENTIONS: Therapeutic exercises, Therapeutic activity, Neuromuscular re-education, Patient/Family education, Self Care, Joint mobilization, Dry Needling, Electrical stimulation, Spinal mobilization, Cryotherapy, Moist heat, Manual therapy, and Re-evaluation  PLAN FOR NEXT SESSION: progressive neck/posture/UE/functional strengthening, motor control, and ROM exercises as tolerated. Education. Manual therapy and dry needling as needed. Neurodynamics as appropriate.    Matthew Saras, Student-PT, DPT 07/29/2023, 4:58 PM  Luretha Murphy. Ilsa Iha, PT, DPT 07/29/23, 8:26 PM  [facility] [address] [place], [place] [address] P: [phone] Cherylann Parr: [phone]   [facility] [address] [place], [place] [address] P: [phone] I F: [phone]

## 2023-07-30 ENCOUNTER — Inpatient Hospital Stay: Payer: Commercial Managed Care - HMO

## 2023-08-03 ENCOUNTER — Ambulatory Visit: Payer: Commercial Managed Care - HMO | Admitting: Physical Therapy

## 2023-08-03 ENCOUNTER — Encounter: Payer: Self-pay | Admitting: Physical Therapy

## 2023-08-03 DIAGNOSIS — M792 Neuralgia and neuritis, unspecified: Secondary | ICD-10-CM

## 2023-08-03 DIAGNOSIS — M542 Cervicalgia: Secondary | ICD-10-CM

## 2023-08-03 DIAGNOSIS — R519 Headache, unspecified: Secondary | ICD-10-CM

## 2023-08-03 DIAGNOSIS — M6281 Muscle weakness (generalized): Secondary | ICD-10-CM

## 2023-08-03 NOTE — Therapy (Signed)
OUTPATIENT PHYSICAL THERAPY TREATMENT / PROGRESS NOTE Dates of reporting from 05/17/2023 to 08/03/2023   Patient Name: Gianelly Sime MRN: [id number] DOB:24-Jun-1991, 32 y.o., female Today's Date: 08/03/2023  END OF SESSION:  PT End of Session - 08/03/23 1820     Visit Number 20    Number of Visits 25    Date for PT Re-Evaluation 08/18/23    Authorization Type [employer] reporting period from 05/17/2023    Progress Note Due on Visit 20    PT Start Time 1820    PT Stop Time 1900    PT Time Calculation (min) 40 min    Activity Tolerance Patient tolerated treatment well    Behavior During Therapy [facility] for tasks assessed/performed              Past Medical History:  Diagnosis Date   Anxiety    Asthma    Breast cancer (HCC) 11/2020   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 AXILLARY NODE DISSECTION;  Surgeon: Carolan Shiver, MD;  Location: [facility] OR[facility];  Service: General;  Laterality: Bilateral;  [place], 1.5 hours Cintron-Diaz 2.5 hours   BREAST BIOPSY Left 11/26/2020   vision 12:00 6cmfn Scnetx   BREAST BIOPSY Left 11/26/2020   LN bx, hydro marker,  fragments of macrometastatic carcinoma   BREAST RECONSTRUCTION WITH PLACEMENT OF TISSUE EXPANDER AND FLEX HD (ACELLULAR HYDRATED DERMIS) Bilateral 06/25/2021   Procedure: BREAST RECONSTRUCTION WITH PLACEMENT OF TISSUE EXPANDER AND FLEX HD (ACELLULAR HYDRATED DERMIS);  Surgeon: Peggye Form, DO;  Location: [facility];  Service: Plastics;  Laterality: Bilateral;   PORTACATH PLACEMENT Right 12/13/2020   Procedure: INSERTION PORT-A-CATH;  Surgeon: Carolan Shiver, MD;   Location: [facility];  Service: General;  Laterality: Right;   REMOVAL OF BILATERAL TISSUE EXPANDERS WITH PLACEMENT OF BILATERAL BREAST IMPLANTS Bilateral 09/15/2021   Procedure: REMOVAL OF BILATERAL TISSUE EXPANDERS;  Surgeon: Peggye Form, DO;  Location:  SURGERY CENTER;  Service: Plastics;  Laterality: Bilateral;   Patient Active Problem List   Diagnosis Date Noted   Chemotherapy-induced neuropathy (HCC) 02/06/2022   Right sided weakness 02/06/2022   Transaminitis 11/20/2021   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 10/25/2021   Malignant neoplasm of left breast in female, estrogen receptor positive (HCC) 06/25/2021   Genetic testing 01/15/2021   Family history of cancer    Invasive carcinoma of breast (HCC) 12/03/2020   Breast mass, left 07/01/2020   Situational mixed anxiety and depressive disorder 07/01/2020   Anxiety state 08/06/2014   Varicose veins of lower extremity 08/06/2014    PCP: Evelena Peat, PA  REFERRING PROVIDER: Evelena Peat, [place]  REFERRING DIAG: migraine without aura and without status migrainosus, not intractable   THERAPY DIAG:  Cervicalgia  Neuralgia and neuritis  Muscle weakness (generalized)  Nonintractable headache, unspecified chronicity pattern, unspecified headache type  Rationale for Evaluation and Treatment: Rehabilitation  ONSET DATE: neck pain chronic over many years, hand paresthesia onset with cancer treatment in 2022  PERTINENT HISTORY:  Patient is a 32 y.o. female who presents to outpatient physical therapy with a referral for medical diagnosis migraine without aura  and without status migrainosus, not intractable. This patient's chief complaints consist of chronic neck pain (R > L) and migriane headaches and bilateral hand/forearm paresthesia (R > L, C8/ulnar nerve distribution worse) leading to the following functional deficits: difficulty with activities that require use B UE, activities with fine  motor control, sorting mail/paperwork, crochet, crafting, working, activities with awkward or prolonged postures. 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  Patient denies hx of stroke, seizures, lung problems, heart problems, diabetes, unexplained weight loss, unexplained changes in bowel or bladder problems, osteoporosis, and spinal surgery.   SUBJECTIVE:                                                                                                                                                                                                         SUBJECTIVE STATEMENT: Patient states her left arm still feels a little sore even though she is on her last day of antibiotics. She states she worked today and went to a painting class today. She states her increased hours at work is going okay on her body. She is standing and walking more. "It feels like it is the right amount of suffering." She states she has an appointment with her liver doctor and they are planning for her to wean off of the drug that makes her feel tired and likely is related to why she got cellulitis. She states she felt pretty good after the needling and massage.   PAIN:  NPRS: 3-4/10 ipsilateral R and L neck pain with rotation  PATIENT GOALS: Learning how to manage symptoms or manage them better. Strength. To get better. To control it so it doesn't get any worse.    OBJECTIVE (measurements last taken 07/21/2023)   SELF-REPORTED FUNCTION FOTO score: 66/100 (neck questionnaire)   SPINE MOTION CERVICAL SPINE AROM (prior to repeated  motions) Rotation:  R 78 soreness in the right upper trap L 72 mild soreness in left upper trap   Grip strength (in pounds, average of three measures).  R: (85+90+86)/3 = 87 L: (95+86+95)/3 = 92   TODAY'S TREATMENT:   Therapeutic exercise: to centralize symptoms and improve ROM, strength, muscular endurance, and activity tolerance required for successful completion of functional activities.   - Upper body ergometer level 7  to encourage joint nutrition, warm tissue, induce analgesic effect of aerobic exercise, improve muscular strength and endurance,  and prepare for remainder of session. 5 min.   Circuit:  - prone lat pull on total gym at level 9, 3x10 - squat to overhead press, 3x10 tapping 17 inch chair and using 5#DB.  - supine chin tuck with lift, deep neck flexor endurance, 3x30-40 seconds  Superset: - inclined bilateral scaption, laying on Total Gym at level 26, 3x10 AROM.  - seated cervical retraction AROM with self overpressure, 3x10  - seated cervical AROM rotation before and after manual therapy (increased discomfort with rotation to the right)  Manual therapy: to reduce pain and tissue tension, improve range of motion, neuromodulation, in order to promote improved ability to complete functional activities. SUPINE - STM to B posterior cervical spine musculature including upper traps, with sustained release at the right mid cervical paraspinals (concordant pain with softening to touchand decreased pain with sustained pressure)  - Grade III CPAs on C3-C5; 2x30 seconds. Tenderness decreased throughout the mobilization.  Pt required multimodal cuing for proper technique and to facilitate improved neuromuscular control, strength, range of motion, and functional ability resulting in improved performance and form.  PATIENT EDUCATION:  Education details: Exercise purpose/form. Self management techniques. Education on diagnosis, prognosis, POC, anatomy and physiology of current  condition Education on HEP including handout  Person educated: Patient Education method: Explanation, Demonstration, Tactile cues, Verbal cues, and Handouts Education comprehension: verbalized understanding, returned demonstration, and needs further education  HOME EXERCISE PROGRAM: Access Code: [email or web address] URL: [email or web address] Date: 05/06/2023 Prepared by: Norton Blizzard  Exercises - Standing Bilateral Low Shoulder Row with Anchored Resistance  - 1 x daily - 3 sets - 10 reps - Sidelying Thoracic Rotation with Open Book  - 1 x daily - 1-2 sets - 10 reps - 5 seconds/1 breath hold - Prone W Scapular Retraction  - 3-5 x weekly - 3 sets - 10 reps - Neutral Curl Up with Arms Across Chest  - 1 x daily - 3-5 x weekly - 5 sets - 30 seconds hold  HOME EXERCISE PROGRAM [[id number]] View at [email or web address]" using code: [id number] Cervical Retraction in Sitting w/OP - MDT -  Repeat 20 Repetitions, Hold 1 Second(s), Complete 1 Set, Perform 8 Times a Day  ASSESSMENT:  CLINICAL IMPRESSION:  Patient has attended 20 physical therapy session since starting current episode of care on 04/13/2023. She has made good progress towards her goals, meeting or nearly meeting most of her goals. She has not yet met her long term HEP, grip strength goal, or FOTO score goal but is approaching them. Plan to continue with physical therapy at a rate of 1-2/week to continue improving activity tolerance and strength with plan to discharge within the next 3-4 weeks. Patient arrived today with improved but not resolved left arm symptoms, so focus returned to strengthening exercises. Patient found exercises challenging but was able to perform them with good form with mild cuing. Patient voiced expectation of being sore tomorrow from the exercises and was educated on DOMS and encouraged to continue her strengthening HEP to help avoid large fluctuations in exercise tolerance. She reported feeling slightly better after  manual therapy but did not have the same reduction of pain as last PT visit and reported right rotation felt "achier." Plan to update HEP as needed next session and continue reinforcing participation in regular strengthening exercises to improve long term pain relief and activity tolerance. Patient would benefit from continued management of  limiting condition by skilled physical therapist to address remaining impairments and functional limitations to work towards stated goals and return to PLOF or maximal functional independence.     From Initial PT Evaluation 04/13/2023:  Patient is a 32 y.o. female referred to outpatient physical therapy with a medical diagnosis of migraine without aura and without status migrainosus, not intractable who presents with signs and symptoms consistent with chronic neck pain and intermittent migraines bilateral hand and forearm paresthesia in glove pattern with worst symptoms at 5th digit. Patient's hand symptoms continue to seem best explained as chemotherapy-induced peripheral neuropathy which is often unable to be detected on nerve conduction testing despite being clinically present due to the fine nerves being affected, while nerve conduction studies are only able to measure changes in large myelinated nerve fibers. Unable to make connection between neck and hand symptoms during exam. Will continue to be alert for signs of radiculopathy and address accordingly. Patient has potential for improved hand strength and control with physical therapy despite the presence of paresthesia. Upon exam, patient was very tender to palpation at suboccipital region which can be a source of cervicogenic contribution to headache or migraine trigger.  Patient presents with significant pain, ROM, joint stiffness, muscle tension, paresthesia, motor control, muscle performance (strength/power/endurance) and activity tolerance impairments that are limiting ability to complete her usual activities such  as those that require use B UE, activities with fine motor control, sorting mail/paperwork, crochet, crafting, working, activities with awkward or prolonged postures without difficulty. Patient will benefit from skilled physical therapy intervention to address current body structure impairments and activity limitations to improve function and work towards goals set in current POC in order to return to prior level of function or maximal functional improvement.   OBJECTIVE IMPAIRMENTS: decreased activity tolerance, decreased coordination, decreased endurance, decreased knowledge of condition, decreased ROM, decreased strength, impaired perceived functional ability, increased muscle spasms, impaired flexibility, impaired sensation, impaired UE functional use, and pain.   ACTIVITY LIMITATIONS: hygiene/grooming and   difficulty with activities that require use B UE, activities with fine motor control, sorting mail/paperwork, crochet, crafting, working, activities with awkward or prolonged postures  PARTICIPATION LIMITATIONS: meal prep, cleaning, interpersonal relationship, and occupation  PERSONAL FACTORS: Past/current experiences, Profession, Time since onset of injury/illness/exacerbation, and 3+ comorbidities:   Anxiety state; Breast mass, left; Situational mixed anxiety and depressive disorder; Varicose veins of lower extremity; Invasive carcinoma of breast (2022);  Malignant neoplasm of left breast in female, estrogen receptor positive (HCC); 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   REHAB POTENTIAL: Good  CLINICAL DECISION MAKING: Evolving/moderate complexity  EVALUATION COMPLEXITY: Moderate    GOALS: Goals reviewed with patient? Yes  SHORT TERM GOALS: Target date: 04/27/2023  Patient will be independent with initial home exercise  program for self-management of symptoms. Baseline: Initial HEP provided at IE (04/13/23); Goal status: MET   LONG TERM GOALS: Target date: 07/06/2023. Target date updated to 08/18/2023 for all unmet goals on 07/21/2023.   Patient will be independent with a long-term home exercise program for self-management of symptoms.  Baseline: Initial HEP provided at IE (04/13/23); currently participating well (05/17/2023); difficulty with participation over the last two weeks (07/21/2023);  Goal status: In-progress  2.  Patient will demonstrate improved FOTO to equal or greater than 69 by visit #10 to demonstrate improvement in overall condition and self-reported functional ability.  Baseline: 59 (04/13/23); 59 at  visit #10 (05/17/2023); 66 at visit #18 (07/21/2023);  Goal status: Nearly met  3.  Patient will demonstrate cervical spine rotation equal or greater than 60 degrees bilaterally without increase in pain to improve her ability to check blind spot while driving.  Baseline: R/L 55/54 degrees (04/13/23); R/L 72/72 degrees (05/17/2023); R/L 78/72 degrees (07/21/2023);  Goal status: MET  4.  Patient will demonstrate improved grip strength by 5lbs each in both UE to demonstrate improvement in B UE strength and ability to use hands for manual tasks such as working.  Baseline: to be measured visit 2 as appropriate (04/13/23); met - see objective (05/17/2023); R/L 84.3/78.3 lbs (04/15/2023); R/L 101.7/94 lbs (05/17/2023); R/L 87/92 lbs (07/21/2023);  Goal status: Nearly met  5.  Patient will complete community, work and/or recreational activities with 50% less limitation due to current condition.  Baseline: difficulty with activities that require use B UE, activities with fine motor control, sorting mail/paperwork, crochet, crafting, working, activities with awkward or prolonged postures (04/13/23); estimates 15-20% improvement (05/17/2023); estimates 50% or more improvement (07/21/2023);  Goal status:  MET   PLAN:  PT FREQUENCY: 1-2x/week  PT DURATION: 12 weeks  PLANNED INTERVENTIONS: Therapeutic exercises, Therapeutic activity, Neuromuscular re-education, Patient/Family education, Self Care, Joint mobilization, Dry Needling, Electrical stimulation, Spinal mobilization, Cryotherapy, Moist heat, Manual therapy, and Re-evaluation  PLAN FOR NEXT SESSION: progressive neck/posture/UE/functional strengthening, motor control, and ROM exercises as tolerated. Education. Manual therapy and dry needling as needed. Neurodynamics as appropriate.    Luretha Murphy. Ilsa Iha, PT, DPT 08/03/23, 8:19 PM  [facility] [address] [place], [place] [address] P: [phone] I F: [phone]

## 2023-08-05 ENCOUNTER — Ambulatory Visit: Payer: Commercial Managed Care - HMO | Admitting: Physical Therapy

## 2023-08-05 ENCOUNTER — Encounter: Payer: Self-pay | Admitting: Physical Therapy

## 2023-08-05 DIAGNOSIS — M542 Cervicalgia: Secondary | ICD-10-CM | POA: Diagnosis not present

## 2023-08-05 DIAGNOSIS — M792 Neuralgia and neuritis, unspecified: Secondary | ICD-10-CM

## 2023-08-05 DIAGNOSIS — M6281 Muscle weakness (generalized): Secondary | ICD-10-CM

## 2023-08-05 DIAGNOSIS — R519 Headache, unspecified: Secondary | ICD-10-CM

## 2023-08-05 NOTE — Therapy (Signed)
OUTPATIENT PHYSICAL THERAPY TREATMENT    Patient Name: Vickie Mathis MRN: [id number] DOB:[birth year], 32 y.o., female Today's Date: 08/05/2023  END OF SESSION:  PT End of Session - 08/05/23 1912     Visit Number 21    Number of Visits 25    Date for PT Re-Evaluation 08/18/23    Authorization Type [employer] reporting period from 08/03/2023    Progress Note Due on Visit 30    PT Start Time 1820    PT Stop Time 1858    PT Time Calculation (min) 38 min    Activity Tolerance Patient tolerated treatment well    Behavior During Therapy [facility] for tasks assessed/performed               Past Medical History:  Diagnosis Date   Anxiety    Asthma    Breast cancer (HCC) 11/2020   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 AXILLARY NODE DISSECTION;  Surgeon: Carolan Shiver, MD;  Location: [facility];  Service: General;  Laterality: Bilateral;  [place], 1.5 hours [place]Diaz 2.5 hours   BREAST BIOPSY Left 11/26/2020   vision 12:00 6cmfn [facility]   BREAST BIOPSY Left 11/26/2020   LN bx, hydro marker,  fragments of macrometastatic carcinoma   BREAST RECONSTRUCTION WITH PLACEMENT OF TISSUE EXPANDER AND FLEX HD (ACELLULAR HYDRATED DERMIS) Bilateral 06/25/2021   Procedure: BREAST RECONSTRUCTION WITH PLACEMENT OF TISSUE EXPANDER AND FLEX HD (ACELLULAR HYDRATED DERMIS);  Surgeon: Peggye Form, DO;  Location: [facility];  Service: Plastics;  Laterality: Bilateral;   PORTACATH PLACEMENT Right 12/13/2020   Procedure: INSERTION PORT-A-CATH;  Surgeon: Carolan Shiver, MD;  Location: [facility];  Service: General;  Laterality: Right;    REMOVAL OF BILATERAL TISSUE EXPANDERS WITH PLACEMENT OF BILATERAL BREAST IMPLANTS Bilateral 09/15/2021   Procedure: REMOVAL OF BILATERAL TISSUE EXPANDERS;  Surgeon: Peggye Form, DO;  Location: [facility];  Service: Plastics;  Laterality: Bilateral;   Patient Active Problem List   Diagnosis Date Noted   Chemotherapy-induced neuropathy (HCC) 02/06/2022   Right sided weakness 02/06/2022   Transaminitis 11/20/2021   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 10/25/2021   Malignant neoplasm of left breast in female, estrogen receptor positive (HCC) 06/25/2021   Genetic testing 01/15/2021   Family history of cancer    Invasive carcinoma of breast (HCC) 12/03/2020   Breast mass, left 07/01/2020   Situational mixed anxiety and depressive disorder 07/01/2020   Anxiety state 08/06/2014   Varicose veins of lower extremity 08/06/2014    PCP: Evelena Peat, PA  REFERRING PROVIDER: Evelena Peat, [place]  REFERRING DIAG: migraine without aura and without status migrainosus, not intractable   THERAPY DIAG:  Cervicalgia  Neuralgia and neuritis  Muscle weakness (generalized)  Nonintractable headache, unspecified chronicity pattern, unspecified headache type  Rationale for Evaluation and Treatment: Rehabilitation  ONSET DATE: neck pain chronic over many years, hand paresthesia onset with cancer treatment in 2022  PERTINENT HISTORY:  Patient is a 32 y.o. female who presents to outpatient physical therapy with a referral for medical diagnosis migraine without aura and without status migrainosus, not intractable. This patient's  chief complaints consist of chronic neck pain (R > L) and migriane headaches and bilateral hand/forearm paresthesia (R > L, C8/ulnar nerve distribution worse) leading to the following functional deficits: difficulty with activities that require use B UE, activities with fine motor control, sorting mail/paperwork, crochet, crafting,  working, activities with awkward or prolonged postures. 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  Patient denies hx of stroke, seizures, lung problems, heart problems, diabetes, unexplained weight loss, unexplained changes in bowel or bladder problems, osteoporosis, and spinal surgery.   SUBJECTIVE:                                                                                                                                                                                                         SUBJECTIVE STATEMENT: Patient states she was pretty sore after last PT session but it was not her concordant pain. She states it still feels like she has an infection in her left UE; her hand is puffy. She still plans to call her doctor about it, but has not yet. She state she is comfortable with her HEP but she needs to do it more.   PAIN:  NPRS: 3/10 ipsilateral R and L neck pain with rotation  PATIENT GOALS: Learning how to manage symptoms or manage them better. Strength. To get better. To control it so it doesn't get any worse.    OBJECTIVE    TODAY'S TREATMENT:   Therapeutic exercise: to centralize symptoms and improve ROM, strength, muscular endurance, and activity tolerance required for successful completion of functional activities.   - Upper body ergometer level 9 to encourage joint nutrition, warm tissue, induce analgesic effect of aerobic exercise, improve muscular strength and endurance,  and prepare for remainder of session. 5 min.   Circuit:  - prone lat pull on total gym at level 8, 3x10 -  squat to overhead press, 3x10 tapping 17 inch chair and using 5#DB.  - supine chin tuck with lift, deep neck flexor endurance, 3x40 seconds  - inclined bilateral scaption, laying on Total Gym at level 26, 3x10 AROM.  - kneeling prayer stretch, 1x10 with 5 seocn dholds.  - Education on HEP including handout    PATIENT EDUCATION:  Education details: Exercise purpose/form. Self management techniques. Education on  diagnosis, prognosis, POC, anatomy and physiology of current condition Education on HEP including handout  Person educated: Patient Education method: Explanation, Demonstration, Tactile cues, Verbal cues, and Handouts Education comprehension: verbalized understanding, returned demonstration, and needs further education  HOME EXERCISE PROGRAM: Access Code: [email or web address] URL: [email or web address] Date: 05/06/2023 Prepared by: Norton Blizzard  Exercises - Standing Bilateral Low Shoulder Row with Anchored Resistance  - 1 x daily - 3 sets - 10 reps - Sidelying Thoracic Rotation with Open Book  - 1 x daily - 1-2 sets - 10 reps - 5 seconds/1 breath hold - Prone W Scapular Retraction  - 3-5 x weekly - 3 sets - 10 reps - Neutral Curl Up with Arms Across Chest  - 1 x daily - 3-5 x weekly - 5 sets - 30 seconds hold  HOME EXERCISE PROGRAM [[id number]] View at "[email or web address]" using code: [id number] Cervical Retraction in Sitting w/OP - MDT -  Repeat 20 Repetitions, Hold 1 Second(s), Complete 1 Set, Perform 8 Times a Day  HOME EXERCISE PROGRAM [[id number]] View at "[email or web address]" using code: [email or web address] Prayer Stretch -  Repeat 10 Repetitions, Hold 5 Seconds, Complete 2 Sets, Perform 3 Times a Week  ASSESSMENT:  CLINICAL IMPRESSION: Patient arrives reporting soreness consistent with DOMS but with no worse pain following last PT session. Continued with strengthening exercises and added stretch for thoracic spine with good success. Plan to review HEP and update it for long term use  with plan to discharge next session. Patient would benefit from continued management of limiting condition by skilled physical therapist to address remaining impairments and functional limitations to work towards stated goals and return to PLOF or maximal functional independence.   From Initial PT Evaluation 04/13/2023:  Patient is a 32 y.o. female referred to outpatient physical therapy with a medical diagnosis of migraine without aura and without status migrainosus, not intractable who presents with signs and symptoms consistent with chronic neck pain and intermittent migraines bilateral hand and forearm paresthesia in glove pattern with worst symptoms at 5th digit. Patient's hand symptoms continue to seem best explained as chemotherapy-induced peripheral neuropathy which is often unable to be detected on nerve conduction testing despite being clinically present due to the fine nerves being affected, while nerve conduction studies are only able to measure changes in large myelinated nerve fibers. Unable to make connection between neck and hand symptoms during exam. Will continue to be alert for signs of radiculopathy and address accordingly. Patient has potential for improved hand strength and control with physical therapy despite the presence of paresthesia. Upon exam, patient was very tender to palpation at suboccipital region which can be a source of cervicogenic contribution to headache or migraine trigger.  Patient presents with significant pain, ROM, joint stiffness, muscle tension, paresthesia, motor control, muscle performance (strength/power/endurance) and activity tolerance impairments that are limiting ability to complete her usual activities such as those that require use B UE, activities with fine motor control, sorting mail/paperwork, crochet, crafting, working, activities with awkward or prolonged postures without difficulty. Patient will benefit from skilled physical therapy intervention to  address current body structure impairments and activity limitations to improve function and work towards goals set in current POC in order to return to prior level of function or maximal functional improvement.   OBJECTIVE IMPAIRMENTS: decreased activity tolerance, decreased coordination, decreased endurance, decreased knowledge of condition, decreased ROM, decreased strength, impaired perceived functional ability, increased muscle spasms, impaired flexibility, impaired sensation, impaired UE functional use, and pain.   ACTIVITY LIMITATIONS: hygiene/grooming and  difficulty with activities that require use B UE, activities with fine motor control, sorting mail/paperwork, crochet, crafting, working, activities with awkward or prolonged postures  PARTICIPATION LIMITATIONS: meal prep, cleaning, interpersonal relationship, and occupation  PERSONAL FACTORS: Past/current experiences, Profession, Time since onset of injury/illness/exacerbation, and 3+ comorbidities:   Anxiety state; Breast mass, left; Situational mixed anxiety and depressive disorder; Varicose veins of lower extremity; Invasive carcinoma of breast (2022);  Malignant neoplasm of left breast in female, estrogen receptor positive (HCC); 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   REHAB POTENTIAL: Good  CLINICAL DECISION MAKING: Evolving/moderate complexity  EVALUATION COMPLEXITY: Moderate    GOALS: Goals reviewed with patient? Yes  SHORT TERM GOALS: Target date: 04/27/2023  Patient will be independent with initial home exercise program for self-management of symptoms. Baseline: Initial HEP provided at IE (04/13/23); Goal status: MET   LONG TERM GOALS: Target date: 07/06/2023. Target date updated to 08/18/2023 for all unmet goals on 07/21/2023.   Patient will be independent  with a long-term home exercise program for self-management of symptoms.  Baseline: Initial HEP provided at IE (04/13/23); currently participating well (05/17/2023); difficulty with participation over the last two weeks (07/21/2023);  Goal status: In-progress  2.  Patient will demonstrate improved FOTO to equal or greater than 69 by visit #10 to demonstrate improvement in overall condition and self-reported functional ability.  Baseline: 59 (04/13/23); 59 at visit #10 (05/17/2023); 66 at visit #18 (07/21/2023);  Goal status: Nearly met  3.  Patient will demonstrate cervical spine rotation equal or greater than 60 degrees bilaterally without increase in pain to improve her ability to check blind spot while driving.  Baseline: R/L 55/54 degrees (04/13/23); R/L 72/72 degrees (05/17/2023); R/L 78/72 degrees (07/21/2023);  Goal status: MET  4.  Patient will demonstrate improved grip strength by 5lbs each in both UE to demonstrate improvement in B UE strength and ability to use hands for manual tasks such as working.  Baseline: to be measured visit 2 as appropriate (04/13/23); met - see objective (05/17/2023); R/L 84.3/78.3 lbs (04/15/2023); R/L 101.7/94 lbs (05/17/2023); R/L 87/92 lbs (07/21/2023);  Goal status: Nearly met  5.  Patient will complete community, work and/or recreational activities with 50% less limitation due to current condition.  Baseline: difficulty with activities that require use B UE, activities with fine motor control, sorting mail/paperwork, crochet, crafting, working, activities with awkward or prolonged postures (04/13/23); estimates 15-20% improvement (05/17/2023); estimates 50% or more improvement (07/21/2023);  Goal status: MET   PLAN:  PT FREQUENCY: 1-2x/week  PT DURATION: 12 weeks  PLANNED INTERVENTIONS: Therapeutic exercises, Therapeutic activity, Neuromuscular re-education, Patient/Family education, Self Care, Joint mobilization, Dry Needling, Electrical stimulation,  Spinal mobilization, Cryotherapy, Moist heat, Manual therapy, and Re-evaluation  PLAN FOR NEXT SESSION: progressive neck/posture/UE/functional strengthening, motor control, and ROM exercises as tolerated. Education. Manual therapy and dry needling as needed. Neurodynamics as appropriate.    Luretha Murphy. Ilsa Iha, PT, DPT 08/05/23, 7:21 PM  [facility] [address] [place], [place] [address] P: [phone] I F: [phone]

## 2023-08-06 ENCOUNTER — Inpatient Hospital Stay: Payer: Commercial Managed Care - HMO | Attending: Oncology

## 2023-08-06 ENCOUNTER — Inpatient Hospital Stay: Payer: Commercial Managed Care - HMO

## 2023-08-06 DIAGNOSIS — G620 Drug-induced polyneuropathy: Secondary | ICD-10-CM | POA: Insufficient documentation

## 2023-08-06 DIAGNOSIS — T451X5A Adverse effect of antineoplastic and immunosuppressive drugs, initial encounter: Secondary | ICD-10-CM | POA: Insufficient documentation

## 2023-08-06 DIAGNOSIS — R7401 Elevation of levels of liver transaminase levels: Secondary | ICD-10-CM

## 2023-08-06 DIAGNOSIS — Z808 Family history of malignant neoplasm of other organs or systems: Secondary | ICD-10-CM | POA: Diagnosis not present

## 2023-08-06 DIAGNOSIS — Z95828 Presence of other vascular implants and grafts: Secondary | ICD-10-CM

## 2023-08-06 DIAGNOSIS — Z853 Personal history of malignant neoplasm of breast: Secondary | ICD-10-CM | POA: Insufficient documentation

## 2023-08-06 DIAGNOSIS — Z9013 Acquired absence of bilateral breasts and nipples: Secondary | ICD-10-CM | POA: Diagnosis not present

## 2023-08-06 DIAGNOSIS — K754 Autoimmune hepatitis: Secondary | ICD-10-CM | POA: Insufficient documentation

## 2023-08-06 DIAGNOSIS — Z8049 Family history of malignant neoplasm of other genital organs: Secondary | ICD-10-CM | POA: Insufficient documentation

## 2023-08-06 DIAGNOSIS — Z809 Family history of malignant neoplasm, unspecified: Secondary | ICD-10-CM | POA: Diagnosis not present

## 2023-08-06 DIAGNOSIS — C50412 Malignant neoplasm of upper-outer quadrant of left female breast: Secondary | ICD-10-CM

## 2023-08-06 LAB — CBC WITH DIFFERENTIAL/PLATELET
Abs Immature Granulocytes: 0.03 10*3/uL (ref 0.00–0.07)
Basophils Absolute: 0.1 10*3/uL (ref 0.0–0.1)
Basophils Relative: 1 %
Eosinophils Absolute: 0.1 10*3/uL (ref 0.0–0.5)
Eosinophils Relative: 2 %
HCT: 38 % (ref 36.0–46.0)
Hemoglobin: 13.1 g/dL (ref 12.0–15.0)
Immature Granulocytes: 1 %
Lymphocytes Relative: 14 %
Lymphs Abs: 0.9 10*3/uL (ref 0.7–4.0)
MCH: 32.3 pg (ref 26.0–34.0)
MCHC: 34.5 g/dL (ref 30.0–36.0)
MCV: 93.6 fL (ref 80.0–100.0)
Monocytes Absolute: 0.6 10*3/uL (ref 0.1–1.0)
Monocytes Relative: 9 %
Neutro Abs: 4.8 10*3/uL (ref 1.7–7.7)
Neutrophils Relative %: 73 %
Platelets: 181 10*3/uL (ref 150–400)
RBC: 4.06 MIL/uL (ref 3.87–5.11)
RDW: 12.4 % (ref 11.5–15.5)
WBC: 6.5 10*3/uL (ref 4.0–10.5)
nRBC: 0 % (ref 0.0–0.2)

## 2023-08-06 LAB — COMPREHENSIVE METABOLIC PANEL
ALT: 59 U/L — ABNORMAL HIGH (ref 0–44)
AST: 50 U/L — ABNORMAL HIGH (ref 15–41)
Albumin: 4.1 g/dL (ref 3.5–5.0)
Alkaline Phosphatase: 73 U/L (ref 38–126)
Anion gap: 8 (ref 5–15)
BUN: 8 mg/dL (ref 6–20)
CO2: 23 mmol/L (ref 22–32)
Calcium: 8.4 mg/dL — ABNORMAL LOW (ref 8.9–10.3)
Chloride: 102 mmol/L (ref 98–111)
Creatinine, Ser: 0.67 mg/dL (ref 0.44–1.00)
GFR, Estimated: >60 mL/min (ref >60)
Glucose, Bld: 100 mg/dL — ABNORMAL HIGH (ref 70–99)
Potassium: 3.8 mmol/L (ref 3.5–5.1)
Sodium: 133 mmol/L — ABNORMAL LOW (ref 135–145)
Total Bilirubin: 0.6 mg/dL (ref 0.3–1.2)
Total Protein: 6.7 g/dL (ref 6.5–8.1)

## 2023-08-06 MED ORDER — HEPARIN SOD (PORK) LOCK FLUSH 100 UNIT/ML IV SOLN
500.0000 [IU] | Freq: Once | INTRAVENOUS | Status: AC
  Administered 2023-08-06: 500 [IU] via INTRAVENOUS
  Filled 2023-08-06: qty 5

## 2023-08-06 MED ORDER — SODIUM CHLORIDE 0.9% FLUSH
10.0000 mL | Freq: Once | INTRAVENOUS | Status: AC
  Administered 2023-08-06: 10 mL via INTRAVENOUS
  Filled 2023-08-06: qty 10

## 2023-08-12 ENCOUNTER — Ambulatory Visit: Payer: Commercial Managed Care - HMO | Admitting: Physical Therapy

## 2023-08-13 ENCOUNTER — Inpatient Hospital Stay: Payer: Commercial Managed Care - HMO

## 2023-08-16 ENCOUNTER — Other Ambulatory Visit: Payer: Self-pay | Admitting: Family Medicine

## 2023-08-16 DIAGNOSIS — M79602 Pain in left arm: Secondary | ICD-10-CM

## 2023-08-17 ENCOUNTER — Ambulatory Visit: Payer: Commercial Managed Care - HMO | Attending: Medical | Admitting: Physical Therapy

## 2023-08-17 ENCOUNTER — Telehealth: Payer: Self-pay | Admitting: Neurosurgery

## 2023-08-17 ENCOUNTER — Encounter: Payer: Self-pay | Admitting: Physical Therapy

## 2023-08-17 ENCOUNTER — Encounter: Payer: Self-pay | Admitting: Oncology

## 2023-08-17 DIAGNOSIS — M542 Cervicalgia: Secondary | ICD-10-CM | POA: Diagnosis present

## 2023-08-17 DIAGNOSIS — R519 Headache, unspecified: Secondary | ICD-10-CM | POA: Insufficient documentation

## 2023-08-17 DIAGNOSIS — M6281 Muscle weakness (generalized): Secondary | ICD-10-CM | POA: Insufficient documentation

## 2023-08-17 DIAGNOSIS — M792 Neuralgia and neuritis, unspecified: Secondary | ICD-10-CM | POA: Diagnosis present

## 2023-08-17 NOTE — Telephone Encounter (Signed)
I called the patient and she wants to hold off on this appt for right now. She was able to get in with [facility] on 08/23/2023

## 2023-08-17 NOTE — Therapy (Signed)
OUTPATIENT PHYSICAL THERAPY TREATMENT    Patient Name: Vickie Mathis MRN: [id number] DOB:07/13/1991, 32 y.o., female Today's Date: 08/17/2023  END OF SESSION:  PT End of Session - 08/17/23 2141     Visit Number 22    Number of Visits 25    Date for PT Re-Evaluation 08/18/23    Authorization Type [employer] reporting period from 08/03/2023    Progress Note Due on Visit 30    PT Start Time 1607    PT Stop Time 1645    PT Time Calculation (min) 38 min    Activity Tolerance Patient tolerated treatment well    Behavior During Therapy WFL for tasks assessed/performed                Past Medical History:  Diagnosis Date   Anxiety    Asthma    Breast cancer (HCC) 11/2020   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 AXILLARY NODE DISSECTION;  Surgeon: Carolan Shiver, MD;  Location: [facility];  Service: General;  Laterality: Bilateral;  [place], 1.5 hours C[place]Diaz 2.5 hours   BREAST BIOPSY Left 11/26/2020   vision 12:00 6cmfn [facility]   BREAST BIOPSY Left 11/26/2020   LN bx, hydro marker,  fragments of macrometastatic carcinoma   BREAST RECONSTRUCTION WITH PLACEMENT OF TISSUE EXPANDER AND FLEX HD (ACELLULAR HYDRATED DERMIS) Bilateral 06/25/2021   Procedure: BREAST RECONSTRUCTION WITH PLACEMENT OF TISSUE EXPANDER AND FLEX HD (ACELLULAR HYDRATED DERMIS);  Surgeon: Peggye Form, DO;  Location: [facility];  Service: Plastics;  Laterality: Bilateral;   PORTACATH PLACEMENT Right 12/13/2020   Procedure: INSERTION PORT-A-CATH;  Surgeon: Carolan Shiver, MD;  Location: [facility];  Service: General;  Laterality: Right;    REMOVAL OF BILATERAL TISSUE EXPANDERS WITH PLACEMENT OF BILATERAL BREAST IMPLANTS Bilateral 09/15/2021   Procedure: REMOVAL OF BILATERAL TISSUE EXPANDERS;  Surgeon: Peggye Form, DO;  Location: [facility];  Service: Plastics;  Laterality: Bilateral;   Patient Active Problem List   Diagnosis Date Noted   Chemotherapy-induced neuropathy (HCC) 02/06/2022   Right sided weakness 02/06/2022   Transaminitis 11/20/2021   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 10/25/2021   Malignant neoplasm of left breast in female, estrogen receptor positive (HCC) 06/25/2021   Genetic testing 01/15/2021   Family history of cancer    Invasive carcinoma of breast (HCC) 12/03/2020   Breast mass, left 07/01/2020   Situational mixed anxiety and depressive disorder 07/01/2020   Anxiety state 08/06/2014   Varicose veins of lower extremity 08/06/2014    PCP: Evelena Peat, PA  REFERRING PROVIDER: Evelena Peat, [place]  REFERRING DIAG: migraine without aura and without status migrainosus, not intractable   THERAPY DIAG:  Cervicalgia  Neuralgia and neuritis  Muscle weakness (generalized)  Nonintractable headache, unspecified chronicity pattern, unspecified headache type  Rationale for Evaluation and Treatment: Rehabilitation  ONSET DATE: neck pain chronic over many years, hand paresthesia onset with cancer treatment in 2022  PERTINENT HISTORY:  Patient is a 32 y.o. female who presents to outpatient physical therapy with a referral for medical diagnosis migraine without aura and without status migrainosus, not intractable. This  patient's chief complaints consist of chronic neck pain (R > L) and migriane headaches and bilateral hand/forearm paresthesia (R > L, C8/ulnar nerve distribution worse) leading to the following functional deficits: difficulty with activities that require use B UE, activities with fine motor control, sorting mail/paperwork, crochet, crafting,  working, activities with awkward or prolonged postures. 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  Patient denies hx of stroke, seizures, lung problems, heart problems, diabetes, unexplained weight loss, unexplained changes in bowel or bladder problems, osteoporosis, and spinal surgery.   SUBJECTIVE:                                                                                                                                                                                                         SUBJECTIVE STATEMENT: Patient states she had a severe migraine, the worst she has ever had. She finally got in touch with [facility] and finally got Torodol shot today from her PCP, which resolved the migraine. She first felt it last Saturday and she ignored it and it came back. She is going to see the neurosurgery team next Monday, but she is not getting an updated brain MRI until December. Patient states she thinks her time in PT has been helpful. She thinks worsening stenosis may lead her to surgery. She is also concerned about the migraines. She has a history of migraines and has had trouble getting her medication for controlling migraines. She has end range neck pain with rotation. She took a few days off during the migraine symptoms. She would get the migraine pain, then the "refractory pain" which it feels like the pain from the migraine causes pain that the medicine for the migraine does not cover. It makes her neck feel really stiff and pain in the left upper trap region. She states moving her head at all made it  feel worse. Patient states she felt pretty good after last PT session.   PAIN:  NPRS: 3/10 ipsilateral R and L neck pain with rotation  PATIENT GOALS: Learning how to manage symptoms or manage them better. Strength. To get better. To control it so it doesn't get any worse.    OBJECTIVE    TODAY'S TREATMENT:   Therapeutic exercise: to centralize symptoms and improve ROM,  strength, muscular endurance, and activity tolerance required for successful completion of functional activities.   - Upper body ergometer level 7 to encourage joint nutrition, warm tissue, induce analgesic effect of aerobic exercise, improve muscular strength and endurance,  and prepare for remainder of session. 5 min.   Manual therapy: to reduce pain and tissue tension, improve range of motion, neuromodulation, in order to promote improved ability to complete functional activities. PRONE - STM to suboccipital muscles and B UT  Modality: Dry needling performed to B suboccipital region to decrease pain and spasms along patient's head and neck with patient in prone utilizing 2 dry needle(s) .25mm x 30/92mm with 2 sticks at each side into suboccipital muscles. Patient educated about the risks and benefits from therapy and verbally consents to treatment.  Dry needling performed by Luretha Murphy. Ilsa Iha PT, DPT who is certified in this technique.    PATIENT EDUCATION:  Education details: Exercise purpose/form. Self management techniques. Education on diagnosis, prognosis, POC, anatomy and physiology of current condition Education on HEP including handout  Person educated: Patient Education method: Explanation, Demonstration, Tactile cues, Verbal cues, and Handouts Education comprehension: verbalized understanding, returned demonstration, and needs further education  HOME EXERCISE PROGRAM: Access Code: [email or web address] URL: [email or web address] Date: 05/06/2023 Prepared by: Norton Blizzard  Exercises - Standing Bilateral  Low Shoulder Row with Anchored Resistance  - 1 x daily - 3 sets - 10 reps - Sidelying Thoracic Rotation with Open Book  - 1 x daily - 1-2 sets - 10 reps - 5 seconds/1 breath hold - Prone W Scapular Retraction  - 3-5 x weekly - 3 sets - 10 reps - Neutral Curl Up with Arms Across Chest  - 1 x daily - 3-5 x weekly - 5 sets - 30 seconds hold  HOME EXERCISE PROGRAM [[id number]] View at "[email or web address]" using code: [id number] Cervical Retraction in Sitting w/OP - MDT -  Repeat 20 Repetitions, Hold 1 Second(s), Complete 1 Set, Perform 8 Times a Day  HOME EXERCISE PROGRAM [[id number]] View at "[email or web address]" using code: [email or web address] Prayer Stretch -  Repeat 10 Repetitions, Hold 5 Seconds, Complete 2 Sets, Perform 3 Times a Week  ASSESSMENT:  CLINICAL IMPRESSION: Patient arrives reporting significant problems with migraine attack since last PT session and upcoming neurosurgery visit next week. Her pain was low today after receiving toradol injection earlier. Utilized dry needling and STM to help address any underlying muscle tension triggers for headache. Patient reported increased releaf and decreased sensation of tightness by end of session. Plan to re-assess goals and consider discharge at next session after patient's consultation with neurosurgeon. Patient would benefit from continued management of limiting condition by skilled physical therapist to address remaining impairments and functional limitations to work towards stated goals and return to PLOF or maximal functional independence.   From Initial PT Evaluation 04/13/2023:  Patient is a 31 y.o. female referred to outpatient physical therapy with a medical diagnosis of migraine without aura and without status migrainosus, not intractable who presents with signs and symptoms consistent with chronic neck pain and intermittent migraines bilateral hand and forearm paresthesia in glove pattern with worst symptoms at 5th digit. Patient's hand symptoms  continue to seem best explained as chemotherapy-induced peripheral neuropathy which is often unable to be detected on nerve conduction testing despite being clinically present due to the fine nerves being affected, while nerve conduction studies are only able to measure changes in large myelinated nerve fibers. Unable to make connection between neck and hand symptoms during exam. Will continue  to be alert for signs of radiculopathy and address accordingly. Patient has potential for improved hand strength and control with physical therapy despite the presence of paresthesia. Upon exam, patient was very tender to palpation at suboccipital region which can be a source of cervicogenic contribution to headache or migraine trigger.  Patient presents with significant pain, ROM, joint stiffness, muscle tension, paresthesia, motor control, muscle performance (strength/power/endurance) and activity tolerance impairments that are limiting ability to complete her usual activities such as those that require use B UE, activities with fine motor control, sorting mail/paperwork, crochet, crafting, working, activities with awkward or prolonged postures without difficulty. Patient will benefit from skilled physical therapy intervention to address current body structure impairments and activity limitations to improve function and work towards goals set in current POC in order to return to prior level of function or maximal functional improvement.   OBJECTIVE IMPAIRMENTS: decreased activity tolerance, decreased coordination, decreased endurance, decreased knowledge of condition, decreased ROM, decreased strength, impaired perceived functional ability, increased muscle spasms, impaired flexibility, impaired sensation, impaired UE functional use, and pain.   ACTIVITY LIMITATIONS: hygiene/grooming and   difficulty with activities that require use B UE, activities with fine motor control, sorting mail/paperwork, crochet, crafting,  working, activities with awkward or prolonged postures  PARTICIPATION LIMITATIONS: meal prep, cleaning, interpersonal relationship, and occupation  PERSONAL FACTORS: Past/current experiences, Profession, Time since onset of injury/illness/exacerbation, and 3+ comorbidities:   Anxiety state; Breast mass, left; Situational mixed anxiety and depressive disorder; Varicose veins of lower extremity; Invasive carcinoma of breast (2022);  Malignant neoplasm of left breast in female, estrogen receptor positive (HCC); 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   REHAB POTENTIAL: Good  CLINICAL DECISION MAKING: Evolving/moderate complexity  EVALUATION COMPLEXITY: Moderate    GOALS: Goals reviewed with patient? Yes  SHORT TERM GOALS: Target date: 04/27/2023  Patient will be independent with initial home exercise program for self-management of symptoms. Baseline: Initial HEP provided at IE (04/13/23); Goal status: MET   LONG TERM GOALS: Target date: 07/06/2023. Target date updated to 08/18/2023 for all unmet goals on 07/21/2023.   Patient will be independent with a long-term home exercise program for self-management of symptoms.  Baseline: Initial HEP provided at IE (04/13/23); currently participating well (05/17/2023); difficulty with participation over the last two weeks (07/21/2023);  Goal status: In-progress  2.  Patient will demonstrate improved FOTO to equal or greater than 69 by visit #10 to demonstrate improvement in overall condition and self-reported functional ability.  Baseline: 59 (04/13/23); 59 at visit #10 (05/17/2023); 66 at visit #18 (07/21/2023);  Goal status: Nearly met  3.  Patient will demonstrate cervical spine rotation equal or greater than 60 degrees bilaterally without increase in pain to improve her ability to check  blind spot while driving.  Baseline: R/L 55/54 degrees (04/13/23); R/L 72/72 degrees (05/17/2023); R/L 78/72 degrees (07/21/2023);  Goal status: MET  4.  Patient will demonstrate improved grip strength by 5lbs each in both UE to demonstrate improvement in B UE strength and ability to use hands for manual tasks such as working.  Baseline: to be measured visit 2 as appropriate (04/13/23); met - see objective (05/17/2023); R/L 84.3/78.3 lbs (04/15/2023); R/L 101.7/94 lbs (05/17/2023); R/L 87/92 lbs (07/21/2023);  Goal status: Nearly met  5.  Patient will complete community, work and/or recreational activities with 50% less limitation due to current condition.  Baseline: difficulty with activities that  require use B UE, activities with fine motor control, sorting mail/paperwork, crochet, crafting, working, activities with awkward or prolonged postures (04/13/23); estimates 15-20% improvement (05/17/2023); estimates 50% or more improvement (07/21/2023);  Goal status: MET   PLAN:  PT FREQUENCY: 1-2x/week  PT DURATION: 12 weeks  PLANNED INTERVENTIONS: Therapeutic exercises, Therapeutic activity, Neuromuscular re-education, Patient/Family education, Self Care, Joint mobilization, Dry Needling, Electrical stimulation, Spinal mobilization, Cryotherapy, Moist heat, Manual therapy, and Re-evaluation  PLAN FOR NEXT SESSION: progressive neck/posture/UE/functional strengthening, motor control, and ROM exercises as tolerated. Education. Manual therapy and dry needling as needed. Neurodynamics as appropriate.    Luretha Murphy. Ilsa Iha, PT, DPT 08/17/23, 9:46 PM  [facility] [address] [place], [place] [address] P: [phone] I F: [phone]

## 2023-08-17 NOTE — Telephone Encounter (Signed)
-----   Message from [facility] sent at 08/17/2023 11:47 AM EST ----- Regarding: RE: Vickie Mathis referral I can see her or one of the others - whatever she prefers ----- Message ----- From: Rockey Situ Sent: 08/17/2023   8:24 AM EST To: Venetia Night, MD Subject: Vickie Mathis referral                               Did Vickie Mathis from [facility] text you yesterday about this patient? He told that patient he was going to reach out to you.  She called the office wanting to be seen for migraines. 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 She feels that [facility] is not wanting to do anything else to help her. Should she see you or a PA?

## 2023-08-23 ENCOUNTER — Inpatient Hospital Stay: Payer: Commercial Managed Care - HMO

## 2023-08-23 ENCOUNTER — Inpatient Hospital Stay (HOSPITAL_BASED_OUTPATIENT_CLINIC_OR_DEPARTMENT_OTHER): Payer: Commercial Managed Care - HMO | Admitting: Oncology

## 2023-08-23 ENCOUNTER — Encounter: Payer: Self-pay | Admitting: Oncology

## 2023-08-23 VITALS — BP 141/74 | HR 89 | Temp 97.9°F | Resp 20 | Ht 69.0 in | Wt 212.0 lb

## 2023-08-23 DIAGNOSIS — K754 Autoimmune hepatitis: Secondary | ICD-10-CM | POA: Diagnosis not present

## 2023-08-23 DIAGNOSIS — Z08 Encounter for follow-up examination after completed treatment for malignant neoplasm: Secondary | ICD-10-CM | POA: Diagnosis not present

## 2023-08-23 DIAGNOSIS — R7401 Elevation of levels of liver transaminase levels: Secondary | ICD-10-CM

## 2023-08-23 DIAGNOSIS — G620 Drug-induced polyneuropathy: Secondary | ICD-10-CM | POA: Diagnosis not present

## 2023-08-23 DIAGNOSIS — Z853 Personal history of malignant neoplasm of breast: Secondary | ICD-10-CM

## 2023-08-23 DIAGNOSIS — Z171 Estrogen receptor negative status [ER-]: Secondary | ICD-10-CM

## 2023-08-23 DIAGNOSIS — T451X5A Adverse effect of antineoplastic and immunosuppressive drugs, initial encounter: Secondary | ICD-10-CM

## 2023-08-23 LAB — CBC WITH DIFFERENTIAL/PLATELET
Abs Immature Granulocytes: 0.09 10*3/uL — ABNORMAL HIGH (ref 0.00–0.07)
Basophils Absolute: 0.1 10*3/uL (ref 0.0–0.1)
Basophils Relative: 1 %
Eosinophils Absolute: 0 10*3/uL (ref 0.0–0.5)
Eosinophils Relative: 0 %
HCT: 39.1 % (ref 36.0–46.0)
Hemoglobin: 13.5 g/dL (ref 12.0–15.0)
Immature Granulocytes: 1 %
Lymphocytes Relative: 9 %
Lymphs Abs: 1.2 10*3/uL (ref 0.7–4.0)
MCH: 32.6 pg (ref 26.0–34.0)
MCHC: 34.5 g/dL (ref 30.0–36.0)
MCV: 94.4 fL (ref 80.0–100.0)
Monocytes Absolute: 0.8 10*3/uL (ref 0.1–1.0)
Monocytes Relative: 6 %
Neutro Abs: 11 10*3/uL — ABNORMAL HIGH (ref 1.7–7.7)
Neutrophils Relative %: 83 %
Platelets: 207 10*3/uL (ref 150–400)
RBC: 4.14 MIL/uL (ref 3.87–5.11)
RDW: 12.9 % (ref 11.5–15.5)
WBC: 13.2 10*3/uL — ABNORMAL HIGH (ref 4.0–10.5)
nRBC: 0 % (ref 0.0–0.2)

## 2023-08-23 LAB — COMPREHENSIVE METABOLIC PANEL
ALT: 41 U/L (ref 0–44)
AST: 39 U/L (ref 15–41)
Albumin: 4.8 g/dL (ref 3.5–5.0)
Alkaline Phosphatase: 66 U/L (ref 38–126)
Anion gap: 13 (ref 5–15)
BUN: 10 mg/dL (ref 6–20)
CO2: 24 mmol/L (ref 22–32)
Calcium: 8.9 mg/dL (ref 8.9–10.3)
Chloride: 100 mmol/L (ref 98–111)
Creatinine, Ser: 0.93 mg/dL (ref 0.44–1.00)
GFR, Estimated: >60 mL/min (ref >60)
Glucose, Bld: 149 mg/dL — ABNORMAL HIGH (ref 70–99)
Potassium: 3.7 mmol/L (ref 3.5–5.1)
Sodium: 137 mmol/L (ref 135–145)
Total Bilirubin: 0.6 mg/dL (ref <1.2)
Total Protein: 7.7 g/dL (ref 6.5–8.1)

## 2023-08-23 MED ORDER — SODIUM CHLORIDE 0.9% FLUSH
10.0000 mL | Freq: Once | INTRAVENOUS | Status: AC
  Administered 2023-08-23: 10 mL via INTRAVENOUS
  Filled 2023-08-23: qty 10

## 2023-08-23 MED ORDER — HEPARIN SOD (PORK) LOCK FLUSH 100 UNIT/ML IV SOLN
500.0000 [IU] | Freq: Once | INTRAVENOUS | Status: AC
  Administered 2023-08-23: 500 [IU] via INTRAVENOUS
  Filled 2023-08-23: qty 5

## 2023-08-23 NOTE — Progress Notes (Signed)
Hematology/Oncology Consult note [facility]  Telephone:([phone] [phone]  Patient Care Team: Dorothey Baseman, MD as PCP - General (Family Medicine) Creig Hines, MD as Consulting Physician (Hematology and Oncology) Dillingham, Alena Bills, DO as Consulting Physician (Plastic Surgery) Carmina Miller, MD as Consulting Physician (Radiation Oncology) Carolan Shiver, MD as Consulting Physician (General Surgery)   Name of the patient: Vickie Mathis  [id number]  [birth year]   Date of visit: 08/23/23  Diagnosis-   locally advanced 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 breast cancer at least T2 N1 M0 s/p neoadjuvant chemotherapy surgery and pathological complete response    Chief complaint/ Reason for visit-routine follow up of breast cancer  Heme/Onc history: patient is a 32 year old female who self palpated a left breast mass and sought medical attention recently after thinking it was a possible cyst for all this while.She underwent a diagnostic bilateral mammogram and ultrasound which showed a 2.7 cm mass at the 12 o'clock position 6 cm from the nipple.  Ultrasound of the left axilla demonstrates 2 lymph nodes with mild thickened cortices of 4 mm.  There is skin thickening on the lower inner quadrant of the left breast on mammography.  Both the breast mass and the lymph node were biopsied and was positive for invasive mammary carcinoma grade 3.  Lymph node was also suspicious for extracapsular extension.    ER/PR and HER-2 negative   MRI showed irregular enhancing mass at the 12 o'clock position of the left breast measuring 2.5 x 2.3 cm and a linear component extending 2.3 cm anteriorly.  The mass in the anterior linear extension combined measure 4.3 cm.  3.9 cm in the cephalocaudal dimension.  Also an area of clumped non-mass enhancement in the posterior aspect of the lower quadrant of the left breast measuring 2.6 x 0.9 cm which looks suspicious.  2 enlarged left  axillary lymph nodes and 2 mildly enlarged internal mammary lymph nodes.  4.3 cm clumped area of non-mass enhancement in the outer quadrant of the right breast.  3 right axillary lymph nodes with mild cortical thickening.   Patient underwent biopsy of the right breast non-mass enhancement and that was negative for malignancy   CT scan showed mildly enlarged right inguinal lymph nodes nonspecific.  Left upper breast mass with prominent left axillary lymph nodes.  Subcentimeter pulmonary nodules which are calcified and compatible with benign old granulomatous disease.  0.6 5.4 cm lucent lesion in the left first rib possibly a hemangioma or benign lesion.   Patient developed significant infusion reaction to carboplatin with dose 9 when her blood pressure dropped and she became tachycardic and significantly nauseous.  She went on to complete [facility] Keytruda chemotherapy as per keynote 522 regimen.  Patient underwent bilateral mastectomy with reconstruction in September 2022.  Final pathology showed complete pathological response2 sentinel lymph nodes negative for malignancy.  No malignancy noted in the right breast.   Plan was to complete 9 cycles of adjuvant Keytruda.  Patient noted to have abnormal LFTs on Keytruda.  Last dose of Keytruda given on 10/08/2021.  Subsequently her LFTs were significantly elevated and patient did not receive any further Keytruda.  She was admitted to [facility] for grade 4 hepatitis likely secondary to [facility].  She was on a prolonged course of steroids and is presently on mycophenolate as directed by [facility].  LFTs have normalized  Interval history-patient is undergoing physical therapy for her neck pain.  She is still on mycophenolate for autoimmune hepatitis and she  hopes to be off therapy in the next 6 months.  She would like to keep her port in place for now.  She is also taking Cymbalta for her neuropathy which is presently stable.  She reports increasing bruising over her bilateral  forearms she noticed after lifting weights at her work  ECOG PS- 1 Pain scale- 0   Review of systems- Review of Systems  Constitutional:  Positive for malaise/fatigue. Negative for chills, fever and weight loss.  HENT:  Negative for congestion, ear discharge and nosebleeds.   Eyes:  Negative for blurred vision.  Respiratory:  Negative for cough, hemoptysis, sputum production, shortness of breath and wheezing.   Cardiovascular:  Negative for chest pain, palpitations, orthopnea and claudication.  Gastrointestinal:  Negative for abdominal pain, blood in stool, constipation, diarrhea, heartburn, melena, nausea and vomiting.  Genitourinary:  Negative for dysuria, flank pain, frequency, hematuria and urgency.  Musculoskeletal:  Positive for neck pain. Negative for back pain, joint pain and myalgias.  Skin:  Negative for rash.  Neurological:  Positive for sensory change (Peripheral neuropathy). Negative for dizziness, tingling, focal weakness, seizures, weakness and headaches.  Endo/Heme/Allergies:  Does not bruise/bleed easily.  Psychiatric/Behavioral:  Negative for depression and suicidal ideas. The patient does not have insomnia.       Allergies  Allergen Reactions   Carboplatin Shortness Of Breath, Nausea And Vomiting and Other (See Comments)    Flushing- chest , face, neck , arm including hand   Amoxicillin     Other reaction(s): "too young to remember what they do to me"   Sulfa Antibiotics     Other reaction(s): "too young to remember what they do to me"     Past Medical History:  Diagnosis Date   Anxiety    Asthma    Breast cancer (HCC) 11/2020   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 breast ca   Depression    Family history of cancer    History of chemotherapy    Varicose veins of bilateral lower extremities with pain      Past Surgical History:  Procedure Laterality Date   APPENDECTOMY  2018   BILATERAL TOTAL MASTECTOMY WITH AXILLARY LYMPH NODE DISSECTION Bilateral 06/25/2021    Procedure: BILATERAL TOTAL MASTECTOMY WITH LEFT AXILLARY LYMPH NODE BIOPSY VS. AXILLARY NODE DISSECTION;  Surgeon: Carolan Shiver, MD;  Location: [facility] ORS;  Service: General;  Laterality: Bilateral;  Dillingham, 1.5 hours Cintron-Diaz 2.5 hours   BREAST BIOPSY Left 11/26/2020   vision 12:00 6cmfn [facility]   BREAST BIOPSY Left 11/26/2020   LN bx, hydro marker,  fragments of macrometastatic carcinoma   BREAST RECONSTRUCTION WITH PLACEMENT OF TISSUE EXPANDER AND FLEX HD (ACELLULAR HYDRATED DERMIS) Bilateral 06/25/2021   Procedure: BREAST RECONSTRUCTION WITH PLACEMENT OF TISSUE EXPANDER AND FLEX HD (ACELLULAR HYDRATED DERMIS);  Surgeon: Peggye Form, DO;  Location: [facility] ORS;  Service: Plastics;  Laterality: Bilateral;   PORTACATH PLACEMENT Right 12/13/2020   Procedure: INSERTION PORT-A-CATH;  Surgeon: Carolan Shiver, MD;  Location: [facility] ORS;  Service: General;  Laterality: Right;   REMOVAL OF BILATERAL TISSUE EXPANDERS WITH PLACEMENT OF BILATERAL BREAST IMPLANTS Bilateral 09/15/2021   Procedure: REMOVAL OF BILATERAL TISSUE EXPANDERS;  Surgeon: Peggye Form, DO;  Location: [facility];  Service: Plastics;  Laterality: Bilateral;    Social History   Socioeconomic History   Marital status: Married    Spouse name: Not on file   Number of children: Not on file   Years of education: Not on file   Highest  education level: Not on file  Occupational History   Not on file  Tobacco Use   Smoking status: Never   Smokeless tobacco: Never  Vaping Use   Vaping status: Never Used  Substance and Sexual Activity   Alcohol use: Not Currently    Comment: occassinally    Drug use: Yes    Types: Marijuana    Comment: occ   Sexual activity: Yes  Other Topics Concern   Not on file  Social History Narrative   Not on file   Social Determinants of Health   Financial Resource Strain: High Risk (07/01/2022)   [facility]   Overall Financial Resource Strain (CARDIA)    Difficulty of Paying Living Expenses: Hard  Food Insecurity: No Food Insecurity (07/01/2022)   [facility]   Hunger Vital Sign    Worried About Running Out of Food in the Last Year: Never true    Ran Out of Food in the Last Year: Never true  Transportation Needs: Unmet Transportation Needs (01/22/2023)   PRAPARE - Administrator, Civil Service (Medical): Yes    Lack of Transportation (Non-Medical): Yes  Physical Activity: Not on file  Stress: Not on file  Social Connections: Not on file  Intimate Partner Violence: Unknown (01/29/2022)   [facility]   Intimate Partner Violence    Fear of Current or Ex-Partner: Not on file    Family History  Problem Relation Age of Onset   Diabetes Father    Varicose Veins Father    Diabetes Paternal Aunt    Uterine cancer Paternal Aunt        precancerous   Diabetes Paternal Grandmother    Uterine cancer Paternal Grandmother        precancerous   Thyroid disease Mother    Thyroid disease Maternal Aunt    Thyroid disease Maternal Grandmother    Cancer Other        stomach vs ovarian/cervical   Cancer Paternal Great-grandmother        unk     Current Outpatient Medications:    busPIRone (BUSPAR) 15 MG tablet, Take 1 tablet by mouth 2 (two) times daily., Disp: , Rfl:    clonazePAM (KLONOPIN) 0.5 MG tablet, Take 1 tablet (0.5 mg total) by mouth 3 (three) times daily as needed., Disp: 60 tablet, Rfl: 0   DULoxetine (CYMBALTA) 60 MG capsule, Take 1 tablet by mouth daily., Disp: , Rfl:    EMGALITY 120 MG/ML SOAJ, Inject 120 mg into the skin every 30 (thirty) days., Disp: , Rfl:    INCASSIA 0.35 MG tablet, Take 1 tablet by mouth daily., Disp: , Rfl:    lidocaine-prilocaine (EMLA) cream, Apply 1 Application topically every 14 (fourteen) days., Disp: 30 g, Rfl: 2   Multiple Vitamin  (MULTIVITAMIN WITH MINERALS) TABS tablet, Take 1 tablet by mouth daily., Disp: , Rfl:    oxyCODONE (OXY IR/ROXICODONE) 5 MG immediate release tablet, Take 1 tablet (5 mg total) by mouth 2 (two) times daily., Disp: 60 tablet, Rfl: 0   predniSONE (DELTASONE) 10 MG tablet, Take 5 tablets daily x 2 days, then 4 tabs daily x 2 days, then 3 tabs daily x 2 days, then 2 tabs daily x 2 days, then 1 tab daily x 2 days, then 1/2 tab daily x 2 days, then stop., Disp: , Rfl:    rizatriptan (MAXALT)  10 MG tablet, Take 1 tab at onset of migraine. May repeat in 2 hours if needed. Max 3/day and 2-3 days/week, Disp: , Rfl:    traZODone (DESYREL) 50 MG tablet, Take 50 mg by mouth at bedtime., Disp: , Rfl:    acamprosate (CAMPRAL) 333 MG tablet, Take 666 mg by mouth 3 (three) times daily. (Patient not taking: Reported on 08/23/2023), Disp: , Rfl:    docusate sodium (COLACE) 100 MG capsule, Take 100 mg by mouth daily as needed for mild constipation. (Patient not taking: Reported on 08/24/2022), Disp: , Rfl:    gabapentin (NEURONTIN) 300 MG capsule, Take 1 capsule (300 mg total) by mouth 3 (three) times daily. (Patient taking differently: Take 300 mg by mouth 3 (three) times daily. PT STATES 600MG  IN PM), Disp: 42 capsule, Rfl: 3   mycophenolate (CELLCEPT) 250 MG capsule, Take 250 mg by mouth 2 (two) times daily., Disp: , Rfl:  No current facility-administered medications for this visit.  Facility-Administered Medications Ordered in Other Visits:    prochlorperazine (COMPAZINE) tablet 10 mg, 10 mg, Oral, Q6H PRN, Creig Hines, MD, 10 mg at 07/16/21 1407  Physical exam:  Vitals:   08/23/23 1318 08/23/23 1328  BP: (!) 140/77 (!) 141/74  Pulse: 84 89  Resp: 20   Temp: 97.9 F (36.6 C)   TempSrc: Tympanic   Weight: 212 lb (96.2 kg)   Height: 5\' 9"  (1.753 m)    Physical Exam Cardiovascular:     Rate and Rhythm: Normal rate and regular rhythm.     Heart sounds: Normal heart sounds.  Pulmonary:     Effort:  Pulmonary effort is normal.     Breath sounds: Normal breath sounds.  Abdominal:     General: Bowel sounds are normal.     Palpations: Abdomen is soft.  Skin:    General: Skin is warm and dry.     Comments: Scattered bilateral forearm bruising noted  Neurological:     Mental Status: She is alert and oriented to person, place, and time.   Chest wall exam: Patient is status post bilateral mastectomy without reconstruction.  No evidence of chest wall recurrence of bilateral axillary adenopathy.  Right chest wall port in place     Latest Ref Rng & Units 08/23/2023    1:00 PM  CMP  Glucose 70 - 99 mg/dL 161   BUN 6 - 20 mg/dL 10   Creatinine 0.96 - 1.00 mg/dL 0.45   Sodium 409 - 811 mmol/L 137   Potassium 3.5 - 5.1 mmol/L 3.7   Chloride 98 - 111 mmol/L 100   CO2 22 - 32 mmol/L 24   Calcium 8.9 - 10.3 mg/dL 8.9   Total Protein 6.5 - 8.1 g/dL 7.7   Total Bilirubin <9.1 mg/dL 0.6   Alkaline Phos 38 - 126 U/L 66   AST 15 - 41 U/L 39   ALT 0 - 44 U/L 41       Latest Ref Rng & Units 08/23/2023    1:00 PM  CBC  WBC 4.0 - 10.5 K/uL 13.2   Hemoglobin 12.0 - 15.0 g/dL 47.8   Hematocrit 29.5 - 46.0 % 39.1   Platelets 150 - 400 K/uL 207     No images are attached to the encounter.  US Venous Img Upper Uni Left (DVT)  Result Date: 07/26/2023 CLINICAL DATA:  Redness, swelling and pain EXAM: LEFT UPPER EXTREMITY VENOUS DOPPLER ULTRASOUND TECHNIQUE: Gray-scale sonography with graded compression, as well as color  Doppler and duplex ultrasound were performed to evaluate the upper extremity deep venous system from the level of the subclavian vein and including the jugular, axillary, basilic, radial, ulnar and upper cephalic vein. Spectral Doppler was utilized to evaluate flow at rest and with distal augmentation maneuvers. COMPARISON:  None Available. FINDINGS: Contralateral Subclavian Vein: Respiratory phasicity is normal and symmetric with the symptomatic side. No evidence of thrombus. Normal  compressibility. Internal Jugular Vein: No evidence of thrombus. Normal compressibility, respiratory phasicity and response to augmentation. Subclavian Vein: No evidence of thrombus. Normal compressibility, respiratory phasicity and response to augmentation. Axillary Vein: No evidence of thrombus. Normal compressibility, respiratory phasicity and response to augmentation. Cephalic Vein: No evidence of thrombus. Normal compressibility, respiratory phasicity and response to augmentation. Basilic Vein: No evidence of thrombus. Normal compressibility, respiratory phasicity and response to augmentation. Brachial Veins: No evidence of thrombus. Normal compressibility, respiratory phasicity and response to augmentation. Radial Veins: No evidence of thrombus. Normal compressibility, respiratory phasicity and response to augmentation. Ulnar Veins: No evidence of thrombus. Normal compressibility, respiratory phasicity and response to augmentation. Venous Reflux:  None visualized. Other Findings:  Mild subcutaneous edema in the left forearm. IMPRESSION: No evidence of DVT within the left upper extremity. Electronically Signed   By: Corlis Leak M.D.   On: 07/26/2023 12:32     Assessment and plan- Patient is a 32 y.o. female with history of stage III 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 breast cancer s/p neoadjuvant chemotherapy with keynote 522 followed by bilateral mastectomy.  Patient had a complete pathological responseAnd also went through adjuvant radiation therapy.  She is here for routine follow-up visit  Clinically patient is doing well with no concerning signs and symptoms of recurrence based on today's exam.  We are now almost 2 years out of last treatment.  Chemo-induced peripheral neuropathy: Overall stable on Cymbalta continue to monitor  Autoimmune hepatitis: Currently on mycophenolate and being tapered by [facility] hepatology.  Easy bruising: Patient is on and off on steroids and currently is undergoing short course  steroids for her neck pain.  Suspect easy bruising secondary to steroid use in the last 1 year.  Continue to monitor   Visit Diagnosis 1. Encounter for follow-up surveillance of breast cancer   2. Autoimmune hepatitis (HCC)   3. Chemotherapy-induced peripheral neuropathy (HCC)      Dr. Owens Shark, MD, MPH [facility] [phone] 08/23/2023 2:16 PM

## 2023-08-24 ENCOUNTER — Encounter: Payer: Self-pay | Admitting: Oncology

## 2023-09-01 ENCOUNTER — Encounter: Payer: Self-pay | Admitting: Physical Therapy

## 2023-09-01 ENCOUNTER — Ambulatory Visit: Payer: Commercial Managed Care - HMO | Admitting: Physical Therapy

## 2023-09-01 DIAGNOSIS — M792 Neuralgia and neuritis, unspecified: Secondary | ICD-10-CM

## 2023-09-01 DIAGNOSIS — R519 Headache, unspecified: Secondary | ICD-10-CM

## 2023-09-01 DIAGNOSIS — M542 Cervicalgia: Secondary | ICD-10-CM | POA: Diagnosis not present

## 2023-09-01 DIAGNOSIS — M6281 Muscle weakness (generalized): Secondary | ICD-10-CM

## 2023-09-01 NOTE — Therapy (Signed)
OUTPATIENT PHYSICAL THERAPY TREATMENT / DISCHARGE SUMMARY Dates of reporting from 04/13/2023 to 09/01/2023   Patient Name: Vickie Mathis MRN: [id number] DOB:1991-02-02, 32 y.o., female Today's Date: 09/01/2023  END OF SESSION:  PT End of Session - 09/01/23 1822     Visit Number 23    Number of Visits 25    Date for PT Re-Evaluation 09/01/23    Authorization Type [employer] reporting period from 08/03/2023    Progress Note Due on Visit 30    PT Start Time 1822    PT Stop Time 1900    PT Time Calculation (min) 38 min    Activity Tolerance Patient tolerated treatment well    Behavior During Therapy [facility] for tasks assessed/performed              Past Medical History:  Diagnosis Date   Anxiety    Asthma    Breast cancer (HCC) 11/2020   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 AXILLARY NODE DISSECTION;  Surgeon: Carolan Shiver, MD;  Location: [facility];  Service: General;  Laterality: Bilateral;  [place], 1.5 hours Cintron[place]Diaz 2.5 hours   BREAST BIOPSY Left 11/26/2020   vision 12:00 6cmfn [facility]   BREAST BIOPSY Left 11/26/2020   LN bx, hydro marker,  fragments of macrometastatic carcinoma   BREAST RECONSTRUCTION WITH PLACEMENT OF TISSUE EXPANDER AND FLEX HD (ACELLULAR HYDRATED DERMIS) Bilateral 06/25/2021   Procedure: BREAST RECONSTRUCTION WITH PLACEMENT OF TISSUE EXPANDER AND FLEX HD (ACELLULAR HYDRATED DERMIS);  Surgeon: Peggye Form, DO;  Location: [facility];  Service: Plastics;  Laterality: Bilateral;   PORTACATH PLACEMENT Right 12/13/2020   Procedure: INSERTION PORT-A-CATH;  Surgeon: Carolan Shiver,  MD;  Location: [facility];  Service: General;  Laterality: Right;   REMOVAL OF BILATERAL TISSUE EXPANDERS WITH PLACEMENT OF BILATERAL BREAST IMPLANTS Bilateral 09/15/2021   Procedure: REMOVAL OF BILATERAL TISSUE EXPANDERS;  Surgeon: Peggye Form, DO;  Location: [facility];  Service: Plastics;  Laterality: Bilateral;   Patient Active Problem List   Diagnosis Date Noted   Chemotherapy-induced neuropathy (HCC) 02/06/2022   Right sided weakness 02/06/2022   Transaminitis 11/20/2021   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 10/25/2021   Malignant neoplasm of left breast in female, estrogen receptor positive (HCC) 06/25/2021   Genetic testing 01/15/2021   Family history of cancer    Invasive carcinoma of breast (HCC) 12/03/2020   Breast mass, left 07/01/2020   Situational mixed anxiety and depressive disorder 07/01/2020   Anxiety state 08/06/2014   Varicose veins of lower extremity 08/06/2014    PCP: Evelena Peat, PA  REFERRING PROVIDER: Evelena Peat, [place]  REFERRING DIAG: migraine without aura and without status migrainosus, not intractable   THERAPY DIAG:  Cervicalgia  Neuralgia and neuritis  Muscle weakness (generalized)  Nonintractable headache, unspecified chronicity pattern, unspecified headache type  Rationale for Evaluation and Treatment: Rehabilitation  ONSET DATE: neck pain chronic over many years, hand paresthesia onset with cancer treatment in 2022  PERTINENT HISTORY:  Patient is a 32 y.o. female who presents to outpatient physical therapy with a referral for medical diagnosis migraine without aura  and without status migrainosus, not intractable. This patient's chief complaints consist of chronic neck pain (R > L) and migriane headaches and bilateral hand/forearm paresthesia (R > L, C8/ulnar nerve distribution worse) leading to the following functional deficits: difficulty with activities that require use B UE, activities with  fine motor control, sorting mail/paperwork, crochet, crafting, working, activities with awkward or prolonged postures. 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  Patient denies hx of stroke, seizures, lung problems, heart problems, diabetes, unexplained weight loss, unexplained changes in bowel or bladder problems, osteoporosis, and spinal surgery.   SUBJECTIVE:                                                                                                                                                                                                         SUBJECTIVE STATEMENT: Patient states her neck is doing pretty well today. She states she can feel the migraine always trying to come back but it feels mostly under control for now. She has 3/10 pain when she moves her head. She states she was off work today and she did a lot of crocheting today. She states she did her HEP yesterday. She does her HEP 3-4 times a week. She does them a lot at work. She states dry needling helped last PT session. Her brain MRI was normal. She states she is going to see a spine specialist now that neurosurgery is out. She is now going to see the spine specialist due to the headaches.   PAIN:  NPRS: 3/10 ipsilateral R and L neck pain with rotation  PATIENT GOALS: Learning how to manage symptoms or manage them better. Strength. To get better. To control it so it doesn't get any worse.    OBJECTIVE   SELF-REPORTED FUNCTION FOTO score: 63/100 (neck questionnaire)    SPINE MOTION CERVICAL SPINE AROM (prior to repeated  motions) Rotation:  R 69 pain in the right upper trap L 60 pain in left upper trap   Grip strength (in pounds, average of three measures).  R: (96+94+90)/3 = 93.3 L: (86+92+96)/3 = 91.3   TODAY'S TREATMENT:   Therapeutic exercise: to centralize symptoms and improve ROM, strength, muscular endurance, and activity tolerance required for successful completion of functional activities.   - Upper  body ergometer level 10 to encourage joint nutrition, warm tissue, induce analgesic effect of aerobic exercise, improve muscular strength and endurance,  and prepare for remainder of session. 5 min.  - testing to assess progress at discharge (see above)  Manual therapy: to reduce pain and tissue tension, improve range of motion, neuromodulation, in order to promote improved ability to complete functional activities. PRONE - STM to posterior cervical spine musculature focusing on suboccipital muscles and B UT  Pt required multimodal cuing for proper technique and to facilitate improved neuromuscular control, strength, range of motion, and functional ability resulting in improved performance and form.  PATIENT EDUCATION:  Education details: discharge recommendation Person educated: Patient Education method: Explanation Education comprehension: verbalized understanding  HOME EXERCISE PROGRAM: Access Code: [email or web address] URL: [email or web address] Date: 05/06/2023 Prepared by: Norton Blizzard  Exercises - Standing Bilateral Low Shoulder Row with Anchored Resistance  - 1 x daily - 3 sets - 10 reps - Sidelying Thoracic Rotation with Open Book  - 1 x daily - 1-2 sets - 10 reps - 5 seconds/1 breath hold - Prone W Scapular Retraction  - 3-5 x weekly - 3 sets - 10 reps - Neutral Curl Up with Arms Across Chest  - 1 x daily - 3-5 x weekly - 5 sets - 30 seconds hold  HOME EXERCISE PROGRAM [[id number]] View at "[email or web address]" using code: [id number] Cervical Retraction in Sitting w/OP - MDT -   Repeat 20 Repetitions, Hold 1 Second(s), Complete 1 Set, Perform 8 Times a Day  HOME EXERCISE PROGRAM [[id number]] View at "[email or web address]" using code: [email or web address] Prayer Stretch -  Repeat 10 Repetitions, Hold 5 Seconds, Complete 2 Sets, Perform 3 Times a Week  ASSESSMENT:  CLINICAL IMPRESSION: Patient has attended 23 physical therapy sessions since starting current episode of care on 04/13/2023. She has made good progress towards her goals and has met all of them except her FOTO goal. She has largely hit a plateau in her progress despite continuing to have pain with cervical spine rotation and more recent difficulty with headaches. She is now discharged to long term HEP due to improvement in symptoms and low probability for further improvement.    OBJECTIVE IMPAIRMENTS: decreased activity tolerance, decreased coordination, decreased endurance, decreased knowledge of condition, decreased ROM, decreased strength, impaired perceived functional ability, increased muscle spasms, impaired flexibility, impaired sensation, impaired UE functional use, and pain.   ACTIVITY LIMITATIONS: hygiene/grooming and   difficulty with activities that require use B UE, activities with fine motor control, sorting mail/paperwork, crochet, crafting, working, activities with awkward or prolonged postures  PARTICIPATION LIMITATIONS: meal prep, cleaning, interpersonal relationship, and occupation  PERSONAL FACTORS: Past/current experiences, Profession, Time since onset of injury/illness/exacerbation, and 3+ comorbidities:   Anxiety state; Breast mass, left; Situational mixed anxiety and depressive disorder; Varicose veins of lower extremity; Invasive carcinoma of breast (2022);  Malignant neoplasm of left breast in female, estrogen receptor positive (HCC); 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   REHAB POTENTIAL: Good  CLINICAL DECISION MAKING: Evolving/moderate complexity  EVALUATION COMPLEXITY: Moderate    GOALS: Goals reviewed with patient? Yes  SHORT TERM GOALS: Target date: 04/27/2023  Patient will be independent with initial home exercise program for self-management of symptoms. Baseline: Initial HEP provided at IE (04/13/23); Goal status: MET   LONG TERM GOALS: Target date: 07/06/2023. Target date updated to 08/18/2023 for all unmet goals on 07/21/2023.   Patient will  be independent with a long-term home exercise program for self-management of symptoms.  Baseline: Initial HEP provided at IE (04/13/23); currently participating well (05/17/2023); difficulty with participation over the last two weeks (07/21/2023);  participating regularly (09/01/2023);  Goal status: MET  2.  Patient will demonstrate improved FOTO to equal or greater than 69 by visit #10 to demonstrate improvement in overall condition and self-reported functional ability.  Baseline: 59 (04/13/23); 59 at visit #10 (05/17/2023); 66 at visit #18 (07/21/2023);  63 at visit #23 (09/01/2023);  Goal status: Partially met  3.  Patient will demonstrate cervical spine rotation equal or greater than 60 degrees bilaterally without increase in pain to improve her ability to check blind spot while driving.  Baseline: R/L 55/54 degrees (04/13/23); R/L 72/72 degrees (05/17/2023); R/L 78/72 degrees (07/21/2023); R/L 69/60 (09/01/2023);  Goal status: MET  4.  Patient will demonstrate improved grip strength by 5lbs each in both UE to demonstrate improvement in B UE strength and ability to use hands for manual tasks such as working.  Baseline: to be measured visit 2 as appropriate (04/13/23); met - see objective (05/17/2023); R/L 84.3/78.3 lbs (04/15/2023); R/L 101.7/94 lbs (05/17/2023); R/L 87/92 lbs (07/21/2023); R/L 93.3/91.3 (09/01/2023);  Goal status: MET  5.  Patient will complete community,  work and/or recreational activities with 50% less limitation due to current condition.  Baseline: difficulty with activities that require use B UE, activities with fine motor control, sorting mail/paperwork, crochet, crafting, working, activities with awkward or prolonged postures (04/13/23); estimates 15-20% improvement (05/17/2023); estimates 50% or more improvement (07/21/2023); estimates 80% improvement (09/01/2023);  Goal status: MET   PLAN:  PT FREQUENCY: one time visit  PT DURATION: 1 week  PLANNED INTERVENTIONS: Therapeutic exercises, Therapeutic activity, Neuromuscular re-education, Patient/Family education, Self Care, Joint mobilization, Dry Needling, Electrical stimulation, Spinal mobilization, Cryotherapy, Moist heat, Manual therapy, and Re-evaluation  PLAN FOR NEXT SESSION: Patient is now discharged from PT   Forest [place]wine R. Ilsa Iha, PT, DPT 09/01/23, 7:25 PM  [facility] [address] [place], [place] [address] P: [phone] I F: [phone]

## 2023-09-06 ENCOUNTER — Encounter: Payer: Self-pay | Admitting: Oncology

## 2023-09-06 ENCOUNTER — Inpatient Hospital Stay: Payer: Commercial Managed Care - HMO

## 2023-09-07 ENCOUNTER — Encounter: Payer: Self-pay | Admitting: Oncology

## 2023-09-07 ENCOUNTER — Encounter: Payer: Commercial Managed Care - HMO | Admitting: Physical Therapy

## 2023-09-09 NOTE — Telephone Encounter (Signed)
Vickie Mathis knows what we wrote last time. We can send her the same letter

## 2023-09-10 ENCOUNTER — Telehealth: Payer: Self-pay | Admitting: *Deleted

## 2023-09-10 NOTE — Telephone Encounter (Signed)
Pt left paper and got Smith Robert to sign for the paper, sent her the paper through her email.

## 2023-09-20 ENCOUNTER — Inpatient Hospital Stay: Payer: Commercial Managed Care - HMO | Attending: Oncology

## 2023-09-20 ENCOUNTER — Inpatient Hospital Stay: Payer: Commercial Managed Care - HMO

## 2023-09-20 ENCOUNTER — Other Ambulatory Visit: Payer: Self-pay

## 2023-09-20 DIAGNOSIS — K754 Autoimmune hepatitis: Secondary | ICD-10-CM

## 2023-09-20 DIAGNOSIS — Z95828 Presence of other vascular implants and grafts: Secondary | ICD-10-CM

## 2023-09-20 DIAGNOSIS — Z853 Personal history of malignant neoplasm of breast: Secondary | ICD-10-CM | POA: Diagnosis present

## 2023-09-20 DIAGNOSIS — Z08 Encounter for follow-up examination after completed treatment for malignant neoplasm: Secondary | ICD-10-CM

## 2023-09-20 LAB — COMPREHENSIVE METABOLIC PANEL
ALT: 34 U/L (ref 0–44)
AST: 34 U/L (ref 15–41)
Albumin: 4.5 g/dL (ref 3.5–5.0)
Alkaline Phosphatase: 60 U/L (ref 38–126)
Anion gap: 10 (ref 5–15)
BUN: 13 mg/dL (ref 6–20)
CO2: 24 mmol/L (ref 22–32)
Calcium: 9.1 mg/dL (ref 8.9–10.3)
Chloride: 105 mmol/L (ref 98–111)
Creatinine, Ser: 0.74 mg/dL (ref 0.44–1.00)
GFR, Estimated: >60 mL/min (ref >60)
Glucose, Bld: 97 mg/dL (ref 70–99)
Potassium: 3.8 mmol/L (ref 3.5–5.1)
Sodium: 139 mmol/L (ref 135–145)
Total Bilirubin: 0.7 mg/dL (ref <1.2)
Total Protein: 7.4 g/dL (ref 6.5–8.1)

## 2023-09-20 LAB — CBC WITH DIFFERENTIAL/PLATELET
Abs Immature Granulocytes: 0.06 10*3/uL (ref 0.00–0.07)
Basophils Absolute: 0.1 10*3/uL (ref 0.0–0.1)
Basophils Relative: 1 %
Eosinophils Absolute: 0.2 10*3/uL (ref 0.0–0.5)
Eosinophils Relative: 2 %
HCT: 40 % (ref 36.0–46.0)
Hemoglobin: 13.6 g/dL (ref 12.0–15.0)
Immature Granulocytes: 1 %
Lymphocytes Relative: 31 %
Lymphs Abs: 2.4 10*3/uL (ref 0.7–4.0)
MCH: 32.3 pg (ref 26.0–34.0)
MCHC: 34 g/dL (ref 30.0–36.0)
MCV: 95 fL (ref 80.0–100.0)
Monocytes Absolute: 0.6 10*3/uL (ref 0.1–1.0)
Monocytes Relative: 7 %
Neutro Abs: 4.7 10*3/uL (ref 1.7–7.7)
Neutrophils Relative %: 58 %
Platelets: 197 10*3/uL (ref 150–400)
RBC: 4.21 MIL/uL (ref 3.87–5.11)
RDW: 13.3 % (ref 11.5–15.5)
WBC: 8 10*3/uL (ref 4.0–10.5)
nRBC: 0 % (ref 0.0–0.2)

## 2023-09-20 MED ORDER — HEPARIN SOD (PORK) LOCK FLUSH 100 UNIT/ML IV SOLN
500.0000 [IU] | Freq: Once | INTRAVENOUS | Status: AC
  Administered 2023-09-20: 500 [IU] via INTRAVENOUS
  Filled 2023-09-20: qty 5

## 2023-09-20 MED ORDER — SODIUM CHLORIDE 0.9% FLUSH
10.0000 mL | Freq: Once | INTRAVENOUS | Status: AC
  Administered 2023-09-20: 10 mL via INTRAVENOUS
  Filled 2023-09-20: qty 10

## 2023-10-04 ENCOUNTER — Inpatient Hospital Stay: Payer: Commercial Managed Care - HMO

## 2023-10-04 ENCOUNTER — Encounter: Payer: Self-pay | Admitting: Oncology

## 2023-10-04 DIAGNOSIS — K754 Autoimmune hepatitis: Secondary | ICD-10-CM

## 2023-10-04 DIAGNOSIS — Z95828 Presence of other vascular implants and grafts: Secondary | ICD-10-CM

## 2023-10-04 DIAGNOSIS — Z08 Encounter for follow-up examination after completed treatment for malignant neoplasm: Secondary | ICD-10-CM

## 2023-10-04 DIAGNOSIS — Z853 Personal history of malignant neoplasm of breast: Secondary | ICD-10-CM | POA: Diagnosis not present

## 2023-10-04 LAB — CBC WITH DIFFERENTIAL/PLATELET
Abs Immature Granulocytes: 0.04 10*3/uL (ref 0.00–0.07)
Basophils Absolute: 0.1 10*3/uL (ref 0.0–0.1)
Basophils Relative: 1 %
Eosinophils Absolute: 0.2 10*3/uL (ref 0.0–0.5)
Eosinophils Relative: 2 %
HCT: 38.6 % (ref 36.0–46.0)
Hemoglobin: 13.1 g/dL (ref 12.0–15.0)
Immature Granulocytes: 1 %
Lymphocytes Relative: 15 %
Lymphs Abs: 1.2 10*3/uL (ref 0.7–4.0)
MCH: 33.1 pg (ref 26.0–34.0)
MCHC: 33.9 g/dL (ref 30.0–36.0)
MCV: 97.5 fL (ref 80.0–100.0)
Monocytes Absolute: 0.6 10*3/uL (ref 0.1–1.0)
Monocytes Relative: 8 %
Neutro Abs: 5.7 10*3/uL (ref 1.7–7.7)
Neutrophils Relative %: 73 %
Platelets: 182 10*3/uL (ref 150–400)
RBC: 3.96 MIL/uL (ref 3.87–5.11)
RDW: 13.5 % (ref 11.5–15.5)
WBC: 7.8 10*3/uL (ref 4.0–10.5)
nRBC: 0 % (ref 0.0–0.2)

## 2023-10-04 LAB — COMPREHENSIVE METABOLIC PANEL
ALT: 57 U/L — ABNORMAL HIGH (ref 0–44)
AST: 44 U/L — ABNORMAL HIGH (ref 15–41)
Albumin: 3.9 g/dL (ref 3.5–5.0)
Alkaline Phosphatase: 73 U/L (ref 38–126)
Anion gap: 7 (ref 5–15)
BUN: 9 mg/dL (ref 6–20)
CO2: 25 mmol/L (ref 22–32)
Calcium: 8.5 mg/dL — ABNORMAL LOW (ref 8.9–10.3)
Chloride: 105 mmol/L (ref 98–111)
Creatinine, Ser: 0.77 mg/dL (ref 0.44–1.00)
GFR, Estimated: >60 mL/min (ref >60)
Glucose, Bld: 101 mg/dL — ABNORMAL HIGH (ref 70–99)
Potassium: 4 mmol/L (ref 3.5–5.1)
Sodium: 137 mmol/L (ref 135–145)
Total Bilirubin: 0.4 mg/dL (ref <1.2)
Total Protein: 6.8 g/dL (ref 6.5–8.1)

## 2023-10-04 MED ORDER — HEPARIN SOD (PORK) LOCK FLUSH 100 UNIT/ML IV SOLN
500.0000 [IU] | Freq: Once | INTRAVENOUS | Status: AC
  Administered 2023-10-04: 500 [IU] via INTRAVENOUS
  Filled 2023-10-04: qty 5

## 2023-10-04 MED ORDER — SODIUM CHLORIDE 0.9% FLUSH
10.0000 mL | Freq: Once | INTRAVENOUS | Status: AC
Start: 2023-10-04 — End: 2023-10-04
  Administered 2023-10-04: 10 mL via INTRAVENOUS
  Filled 2023-10-04: qty 10

## 2023-10-07 ENCOUNTER — Encounter: Payer: Self-pay | Admitting: Oncology

## 2023-10-18 ENCOUNTER — Inpatient Hospital Stay: Payer: Commercial Managed Care - HMO | Attending: Oncology

## 2023-10-18 ENCOUNTER — Inpatient Hospital Stay: Payer: Commercial Managed Care - HMO

## 2023-10-18 DIAGNOSIS — Z853 Personal history of malignant neoplasm of breast: Secondary | ICD-10-CM | POA: Insufficient documentation

## 2023-10-27 ENCOUNTER — Telehealth: Payer: Self-pay | Admitting: *Deleted

## 2023-10-27 NOTE — Telephone Encounter (Signed)
I sent the paper email for the pt. For her work.

## 2023-11-01 ENCOUNTER — Inpatient Hospital Stay: Payer: Commercial Managed Care - HMO

## 2023-11-01 DIAGNOSIS — R7401 Elevation of levels of liver transaminase levels: Secondary | ICD-10-CM

## 2023-11-01 DIAGNOSIS — Z95828 Presence of other vascular implants and grafts: Secondary | ICD-10-CM

## 2023-11-01 DIAGNOSIS — Z171 Estrogen receptor negative status [ER-]: Secondary | ICD-10-CM

## 2023-11-01 DIAGNOSIS — Z853 Personal history of malignant neoplasm of breast: Secondary | ICD-10-CM | POA: Diagnosis present

## 2023-11-01 LAB — COMPREHENSIVE METABOLIC PANEL
ALT: 59 U/L — ABNORMAL HIGH (ref 0–44)
AST: 38 U/L (ref 15–41)
Albumin: 4.6 g/dL (ref 3.5–5.0)
Alkaline Phosphatase: 73 U/L (ref 38–126)
Anion gap: 9 (ref 5–15)
BUN: 11 mg/dL (ref 6–20)
CO2: 24 mmol/L (ref 22–32)
Calcium: 9 mg/dL (ref 8.9–10.3)
Chloride: 103 mmol/L (ref 98–111)
Creatinine, Ser: 0.59 mg/dL (ref 0.44–1.00)
GFR, Estimated: >60 mL/min (ref >60)
Glucose, Bld: 97 mg/dL (ref 70–99)
Potassium: 3.9 mmol/L (ref 3.5–5.1)
Sodium: 136 mmol/L (ref 135–145)
Total Bilirubin: 0.3 mg/dL (ref 0.0–1.2)
Total Protein: 7.4 g/dL (ref 6.5–8.1)

## 2023-11-01 LAB — CBC WITH DIFFERENTIAL/PLATELET
Abs Immature Granulocytes: 0.07 10*3/uL (ref 0.00–0.07)
Basophils Absolute: 0.1 10*3/uL (ref 0.0–0.1)
Basophils Relative: 1 %
Eosinophils Absolute: 0.1 10*3/uL (ref 0.0–0.5)
Eosinophils Relative: 1 %
HCT: 41.1 % (ref 36.0–46.0)
Hemoglobin: 14.2 g/dL (ref 12.0–15.0)
Immature Granulocytes: 1 %
Lymphocytes Relative: 12 %
Lymphs Abs: 1.2 10*3/uL (ref 0.7–4.0)
MCH: 33.3 pg (ref 26.0–34.0)
MCHC: 34.5 g/dL (ref 30.0–36.0)
MCV: 96.5 fL (ref 80.0–100.0)
Monocytes Absolute: 0.5 10*3/uL (ref 0.1–1.0)
Monocytes Relative: 6 %
Neutro Abs: 7.7 10*3/uL (ref 1.7–7.7)
Neutrophils Relative %: 79 %
Platelets: 224 10*3/uL (ref 150–400)
RBC: 4.26 MIL/uL (ref 3.87–5.11)
RDW: 13.3 % (ref 11.5–15.5)
WBC: 9.7 10*3/uL (ref 4.0–10.5)
nRBC: 0 % (ref 0.0–0.2)

## 2023-11-01 MED ORDER — SODIUM CHLORIDE 0.9% FLUSH
10.0000 mL | Freq: Once | INTRAVENOUS | Status: AC
  Administered 2023-11-01: 10 mL via INTRAVENOUS
  Filled 2023-11-01: qty 10

## 2023-11-01 MED ORDER — HEPARIN SOD (PORK) LOCK FLUSH 100 UNIT/ML IV SOLN
500.0000 [IU] | Freq: Once | INTRAVENOUS | Status: AC
  Administered 2023-11-01: 500 [IU] via INTRAVENOUS
  Filled 2023-11-01: qty 5

## 2023-11-03 ENCOUNTER — Encounter: Payer: Self-pay | Admitting: Oncology

## 2023-11-05 ENCOUNTER — Telehealth: Payer: Self-pay | Admitting: *Deleted

## 2023-11-05 NOTE — Telephone Encounter (Signed)
Patient called today and said that she has a new supervisor and she needs the paper that we filled out for her and leave a copy of it downstairs so that she can pick it up today.  I called the patient and let her know that she can come anytime to get it

## 2023-11-15 ENCOUNTER — Inpatient Hospital Stay: Payer: Commercial Managed Care - HMO | Attending: Oncology

## 2023-11-15 ENCOUNTER — Inpatient Hospital Stay: Payer: Commercial Managed Care - HMO

## 2023-11-15 DIAGNOSIS — Z95828 Presence of other vascular implants and grafts: Secondary | ICD-10-CM

## 2023-11-15 DIAGNOSIS — Z853 Personal history of malignant neoplasm of breast: Secondary | ICD-10-CM | POA: Diagnosis present

## 2023-11-15 DIAGNOSIS — K754 Autoimmune hepatitis: Secondary | ICD-10-CM

## 2023-11-15 LAB — CBC WITH DIFFERENTIAL/PLATELET
Abs Immature Granulocytes: 0.04 10*3/uL (ref 0.00–0.07)
Basophils Absolute: 0.1 10*3/uL (ref 0.0–0.1)
Basophils Relative: 1 %
Eosinophils Absolute: 0.1 10*3/uL (ref 0.0–0.5)
Eosinophils Relative: 2 %
HCT: 40.1 % (ref 36.0–46.0)
Hemoglobin: 13.8 g/dL (ref 12.0–15.0)
Immature Granulocytes: 1 %
Lymphocytes Relative: 15 %
Lymphs Abs: 1.2 10*3/uL (ref 0.7–4.0)
MCH: 33.2 pg (ref 26.0–34.0)
MCHC: 34.4 g/dL (ref 30.0–36.0)
MCV: 96.4 fL (ref 80.0–100.0)
Monocytes Absolute: 0.6 10*3/uL (ref 0.1–1.0)
Monocytes Relative: 8 %
Neutro Abs: 5.8 10*3/uL (ref 1.7–7.7)
Neutrophils Relative %: 73 %
Platelets: 196 10*3/uL (ref 150–400)
RBC: 4.16 MIL/uL (ref 3.87–5.11)
RDW: 12.8 % (ref 11.5–15.5)
WBC: 7.8 10*3/uL (ref 4.0–10.5)
nRBC: 0 % (ref 0.0–0.2)

## 2023-11-15 LAB — COMPREHENSIVE METABOLIC PANEL
ALT: 35 U/L (ref 0–44)
AST: 31 U/L (ref 15–41)
Albumin: 4.2 g/dL (ref 3.5–5.0)
Alkaline Phosphatase: 70 U/L (ref 38–126)
Anion gap: 9 (ref 5–15)
BUN: 11 mg/dL (ref 6–20)
CO2: 23 mmol/L (ref 22–32)
Calcium: 8.6 mg/dL — ABNORMAL LOW (ref 8.9–10.3)
Chloride: 104 mmol/L (ref 98–111)
Creatinine, Ser: 0.7 mg/dL (ref 0.44–1.00)
GFR, Estimated: >60 mL/min (ref >60)
Glucose, Bld: 106 mg/dL — ABNORMAL HIGH (ref 70–99)
Potassium: 4 mmol/L (ref 3.5–5.1)
Sodium: 136 mmol/L (ref 135–145)
Total Bilirubin: 0.7 mg/dL (ref 0.0–1.2)
Total Protein: 7.1 g/dL (ref 6.5–8.1)

## 2023-11-15 MED ORDER — SODIUM CHLORIDE 0.9% FLUSH
10.0000 mL | Freq: Once | INTRAVENOUS | Status: AC
Start: 2023-11-15 — End: 2023-11-15
  Administered 2023-11-15: 10 mL via INTRAVENOUS
  Filled 2023-11-15: qty 10

## 2023-11-15 MED ORDER — HEPARIN SOD (PORK) LOCK FLUSH 100 UNIT/ML IV SOLN
500.0000 [IU] | Freq: Once | INTRAVENOUS | Status: AC
  Administered 2023-11-15: 500 [IU] via INTRAVENOUS
  Filled 2023-11-15: qty 5

## 2023-11-16 ENCOUNTER — Telehealth: Payer: Self-pay

## 2023-11-16 NOTE — Telephone Encounter (Signed)
Called patient to inform her that her work excuse is ready to be picked up. She did not answer and I was unable to leave voicemail.

## 2023-11-29 ENCOUNTER — Inpatient Hospital Stay: Payer: Commercial Managed Care - HMO

## 2023-11-29 ENCOUNTER — Encounter: Payer: Self-pay | Admitting: Oncology

## 2023-11-29 DIAGNOSIS — Z95828 Presence of other vascular implants and grafts: Secondary | ICD-10-CM

## 2023-11-29 DIAGNOSIS — C50012 Malignant neoplasm of nipple and areola, left female breast: Secondary | ICD-10-CM

## 2023-11-29 DIAGNOSIS — Z853 Personal history of malignant neoplasm of breast: Secondary | ICD-10-CM | POA: Diagnosis not present

## 2023-11-29 LAB — CBC WITH DIFFERENTIAL (CANCER CENTER ONLY)
Abs Immature Granulocytes: 0.04 10*3/uL (ref 0.00–0.07)
Basophils Absolute: 0.1 10*3/uL (ref 0.0–0.1)
Basophils Relative: 1 %
Eosinophils Absolute: 0.1 10*3/uL (ref 0.0–0.5)
Eosinophils Relative: 1 %
HCT: 39.1 % (ref 36.0–46.0)
Hemoglobin: 14 g/dL (ref 12.0–15.0)
Immature Granulocytes: 1 %
Lymphocytes Relative: 17 %
Lymphs Abs: 1.3 10*3/uL (ref 0.7–4.0)
MCH: 34.6 pg — ABNORMAL HIGH (ref 26.0–34.0)
MCHC: 35.8 g/dL (ref 30.0–36.0)
MCV: 96.5 fL (ref 80.0–100.0)
Monocytes Absolute: 0.5 10*3/uL (ref 0.1–1.0)
Monocytes Relative: 7 %
Neutro Abs: 5.8 10*3/uL (ref 1.7–7.7)
Neutrophils Relative %: 73 %
Platelet Count: 200 10*3/uL (ref 150–400)
RBC: 4.05 MIL/uL (ref 3.87–5.11)
RDW: 12.5 % (ref 11.5–15.5)
WBC Count: 7.9 10*3/uL (ref 4.0–10.5)
nRBC: 0 % (ref 0.0–0.2)

## 2023-11-29 LAB — CMP (CANCER CENTER ONLY)
ALT: 84 U/L — ABNORMAL HIGH (ref 0–44)
AST: 48 U/L — ABNORMAL HIGH (ref 15–41)
Albumin: 4.4 g/dL (ref 3.5–5.0)
Alkaline Phosphatase: 69 U/L (ref 38–126)
Anion gap: 9 (ref 5–15)
BUN: 10 mg/dL (ref 6–20)
CO2: 24 mmol/L (ref 22–32)
Calcium: 8.7 mg/dL — ABNORMAL LOW (ref 8.9–10.3)
Chloride: 104 mmol/L (ref 98–111)
Creatinine: 0.7 mg/dL (ref 0.44–1.00)
GFR, Estimated: >60 mL/min (ref >60)
Glucose, Bld: 107 mg/dL — ABNORMAL HIGH (ref 70–99)
Potassium: 3.5 mmol/L (ref 3.5–5.1)
Sodium: 137 mmol/L (ref 135–145)
Total Bilirubin: 0.5 mg/dL (ref 0.0–1.2)
Total Protein: 7.3 g/dL (ref 6.5–8.1)

## 2023-11-29 MED ORDER — SODIUM CHLORIDE 0.9% FLUSH
10.0000 mL | Freq: Once | INTRAVENOUS | Status: AC
  Administered 2023-11-29: 10 mL via INTRAVENOUS
  Filled 2023-11-29: qty 10

## 2023-11-29 MED ORDER — HEPARIN SOD (PORK) LOCK FLUSH 100 UNIT/ML IV SOLN
500.0000 [IU] | Freq: Once | INTRAVENOUS | Status: AC
Start: 2023-11-29 — End: 2023-11-29
  Administered 2023-11-29: 500 [IU] via INTRAVENOUS
  Filled 2023-11-29: qty 5

## 2023-12-01 ENCOUNTER — Telehealth: Payer: Self-pay

## 2023-12-06 ENCOUNTER — Encounter: Payer: Self-pay | Admitting: Oncology

## 2023-12-13 ENCOUNTER — Inpatient Hospital Stay: Payer: Commercial Managed Care - HMO | Attending: Oncology

## 2023-12-13 ENCOUNTER — Inpatient Hospital Stay: Payer: Commercial Managed Care - HMO

## 2023-12-13 DIAGNOSIS — Z853 Personal history of malignant neoplasm of breast: Secondary | ICD-10-CM | POA: Insufficient documentation

## 2023-12-13 DIAGNOSIS — K754 Autoimmune hepatitis: Secondary | ICD-10-CM

## 2023-12-13 DIAGNOSIS — Z95828 Presence of other vascular implants and grafts: Secondary | ICD-10-CM

## 2023-12-13 LAB — CBC WITH DIFFERENTIAL/PLATELET
Abs Immature Granulocytes: 0.03 10*3/uL (ref 0.00–0.07)
Basophils Absolute: 0.1 10*3/uL (ref 0.0–0.1)
Basophils Relative: 1 %
Eosinophils Absolute: 0.3 10*3/uL (ref 0.0–0.5)
Eosinophils Relative: 4 %
HCT: 39.4 % (ref 36.0–46.0)
Hemoglobin: 13.7 g/dL (ref 12.0–15.0)
Immature Granulocytes: 1 %
Lymphocytes Relative: 18 %
Lymphs Abs: 1.2 10*3/uL (ref 0.7–4.0)
MCH: 33.9 pg (ref 26.0–34.0)
MCHC: 34.8 g/dL (ref 30.0–36.0)
MCV: 97.5 fL (ref 80.0–100.0)
Monocytes Absolute: 0.4 10*3/uL (ref 0.1–1.0)
Monocytes Relative: 5 %
Neutro Abs: 4.6 10*3/uL (ref 1.7–7.7)
Neutrophils Relative %: 71 %
Platelets: 200 10*3/uL (ref 150–400)
RBC: 4.04 MIL/uL (ref 3.87–5.11)
RDW: 12.4 % (ref 11.5–15.5)
WBC: 6.5 10*3/uL (ref 4.0–10.5)
nRBC: 0 % (ref 0.0–0.2)

## 2023-12-13 LAB — COMPREHENSIVE METABOLIC PANEL
ALT: 54 U/L — ABNORMAL HIGH (ref 0–44)
AST: 46 U/L — ABNORMAL HIGH (ref 15–41)
Albumin: 4 g/dL (ref 3.5–5.0)
Alkaline Phosphatase: 65 U/L (ref 38–126)
Anion gap: 9 (ref 5–15)
BUN: 8 mg/dL (ref 6–20)
CO2: 22 mmol/L (ref 22–32)
Calcium: 8.4 mg/dL — ABNORMAL LOW (ref 8.9–10.3)
Chloride: 102 mmol/L (ref 98–111)
Creatinine, Ser: 0.89 mg/dL (ref 0.44–1.00)
GFR, Estimated: >60 mL/min (ref >60)
Glucose, Bld: 241 mg/dL — ABNORMAL HIGH (ref 70–99)
Potassium: 3.6 mmol/L (ref 3.5–5.1)
Sodium: 133 mmol/L — ABNORMAL LOW (ref 135–145)
Total Bilirubin: 0.5 mg/dL (ref 0.0–1.2)
Total Protein: 6.6 g/dL (ref 6.5–8.1)

## 2023-12-13 MED ORDER — HEPARIN SOD (PORK) LOCK FLUSH 100 UNIT/ML IV SOLN
500.0000 [IU] | Freq: Once | INTRAVENOUS | Status: AC
  Administered 2023-12-13: 500 [IU] via INTRAVENOUS
  Filled 2023-12-13: qty 5

## 2023-12-13 MED ORDER — SODIUM CHLORIDE 0.9% FLUSH
10.0000 mL | Freq: Once | INTRAVENOUS | Status: AC
  Administered 2023-12-13: 10 mL via INTRAVENOUS
  Filled 2023-12-13: qty 10

## 2023-12-30 ENCOUNTER — Encounter: Payer: Self-pay | Admitting: Oncology

## 2024-01-04 ENCOUNTER — Telehealth: Payer: Self-pay | Admitting: *Deleted

## 2024-01-04 NOTE — Telephone Encounter (Signed)
 I called the pt and let her know that she has the paper signed and the letter also done and she can pick it up any time, she is aware and will come and pick up papers

## 2024-01-06 ENCOUNTER — Telehealth: Payer: Self-pay | Admitting: *Deleted

## 2024-01-06 ENCOUNTER — Encounter: Payer: Self-pay | Admitting: *Deleted

## 2024-01-06 NOTE — Telephone Encounter (Signed)
 I called her and let her know that we can do a new letter and Smith Robert can sign it tom.

## 2024-01-07 ENCOUNTER — Telehealth: Payer: Self-pay

## 2024-01-07 ENCOUNTER — Telehealth: Payer: Self-pay | Admitting: *Deleted

## 2024-01-07 NOTE — Telephone Encounter (Signed)
 On Monday, January 03, 2024, the patient visited the clinic to pick up the requested light duty form. The patient made handwritten/ write-out modifications to the form and requested a copy.  On Wednesday, January 05, 2024, the patient returned with the same paperwork, requesting that it be completed again. She stated that the form was denied due to the handwritten/ write-out modifications.

## 2024-01-07 NOTE — Telephone Encounter (Signed)
 I called the patient because that someone in the cancer center saw you taking you tape eraser of the light duty paper and writing over it. If she needs changes it has to be [place] to take care of that . She is never do that again. She is sorry and I do not needs the forms any longer because she is full time. In the future if she needs anything then it would be a letter that staff here so she is sorry and we will not have this issue again.

## 2024-02-21 ENCOUNTER — Inpatient Hospital Stay: Payer: Managed Care, Other (non HMO)

## 2024-02-21 ENCOUNTER — Encounter: Payer: Self-pay | Admitting: Oncology

## 2024-02-21 ENCOUNTER — Telehealth: Payer: Self-pay | Admitting: *Deleted

## 2024-02-21 ENCOUNTER — Inpatient Hospital Stay (HOSPITAL_BASED_OUTPATIENT_CLINIC_OR_DEPARTMENT_OTHER): Payer: Managed Care, Other (non HMO) | Admitting: Oncology

## 2024-02-21 ENCOUNTER — Inpatient Hospital Stay: Payer: Managed Care, Other (non HMO) | Attending: Oncology

## 2024-02-21 VITALS — BP 141/88 | HR 90 | Temp 97.1°F | Resp 17 | Wt 215.0 lb

## 2024-02-21 DIAGNOSIS — Z9013 Acquired absence of bilateral breasts and nipples: Secondary | ICD-10-CM | POA: Insufficient documentation

## 2024-02-21 DIAGNOSIS — T451X5A Adverse effect of antineoplastic and immunosuppressive drugs, initial encounter: Secondary | ICD-10-CM | POA: Insufficient documentation

## 2024-02-21 DIAGNOSIS — Z853 Personal history of malignant neoplasm of breast: Secondary | ICD-10-CM | POA: Diagnosis not present

## 2024-02-21 DIAGNOSIS — F32A Depression, unspecified: Secondary | ICD-10-CM | POA: Insufficient documentation

## 2024-02-21 DIAGNOSIS — F1020 Alcohol dependence, uncomplicated: Secondary | ICD-10-CM | POA: Diagnosis not present

## 2024-02-21 DIAGNOSIS — G620 Drug-induced polyneuropathy: Secondary | ICD-10-CM

## 2024-02-21 DIAGNOSIS — Z08 Encounter for follow-up examination after completed treatment for malignant neoplasm: Secondary | ICD-10-CM | POA: Diagnosis not present

## 2024-02-21 DIAGNOSIS — Z79899 Other long term (current) drug therapy: Secondary | ICD-10-CM | POA: Insufficient documentation

## 2024-02-21 DIAGNOSIS — K754 Autoimmune hepatitis: Secondary | ICD-10-CM

## 2024-02-21 DIAGNOSIS — F419 Anxiety disorder, unspecified: Secondary | ICD-10-CM | POA: Insufficient documentation

## 2024-02-21 DIAGNOSIS — Z95828 Presence of other vascular implants and grafts: Secondary | ICD-10-CM

## 2024-02-21 LAB — COMPREHENSIVE METABOLIC PANEL WITH GFR
ALT: 65 U/L — ABNORMAL HIGH (ref 0–44)
AST: 45 U/L — ABNORMAL HIGH (ref 15–41)
Albumin: 4.2 g/dL (ref 3.5–5.0)
Alkaline Phosphatase: 83 U/L (ref 38–126)
Anion gap: 8 (ref 5–15)
BUN: 16 mg/dL (ref 6–20)
CO2: 23 mmol/L (ref 22–32)
Calcium: 8.7 mg/dL — ABNORMAL LOW (ref 8.9–10.3)
Chloride: 103 mmol/L (ref 98–111)
Creatinine, Ser: 0.72 mg/dL (ref 0.44–1.00)
GFR, Estimated: >60 mL/min (ref >60)
Glucose, Bld: 104 mg/dL — ABNORMAL HIGH (ref 70–99)
Potassium: 3.5 mmol/L (ref 3.5–5.1)
Sodium: 134 mmol/L — ABNORMAL LOW (ref 135–145)
Total Bilirubin: 0.3 mg/dL (ref 0.0–1.2)
Total Protein: 7.2 g/dL (ref 6.5–8.1)

## 2024-02-21 LAB — CBC WITH DIFFERENTIAL/PLATELET
Abs Immature Granulocytes: 0.05 10*3/uL (ref 0.00–0.07)
Basophils Absolute: 0.1 10*3/uL (ref 0.0–0.1)
Basophils Relative: 1 %
Eosinophils Absolute: 0.2 10*3/uL (ref 0.0–0.5)
Eosinophils Relative: 2 %
HCT: 35.5 % — ABNORMAL LOW (ref 36.0–46.0)
Hemoglobin: 12.4 g/dL (ref 12.0–15.0)
Immature Granulocytes: 1 %
Lymphocytes Relative: 16 %
Lymphs Abs: 1.5 10*3/uL (ref 0.7–4.0)
MCH: 33.2 pg (ref 26.0–34.0)
MCHC: 34.9 g/dL (ref 30.0–36.0)
MCV: 94.9 fL (ref 80.0–100.0)
Monocytes Absolute: 0.8 10*3/uL (ref 0.1–1.0)
Monocytes Relative: 8 %
Neutro Abs: 6.9 10*3/uL (ref 1.7–7.7)
Neutrophils Relative %: 72 %
Platelets: 209 10*3/uL (ref 150–400)
RBC: 3.74 MIL/uL — ABNORMAL LOW (ref 3.87–5.11)
RDW: 12.2 % (ref 11.5–15.5)
WBC: 9.5 10*3/uL (ref 4.0–10.5)
nRBC: 0 % (ref 0.0–0.2)

## 2024-02-21 MED ORDER — SODIUM CHLORIDE 0.9% FLUSH
10.0000 mL | Freq: Once | INTRAVENOUS | Status: AC
  Administered 2024-02-21: 10 mL via INTRAVENOUS
  Filled 2024-02-21: qty 10

## 2024-02-21 MED ORDER — HEPARIN SOD (PORK) LOCK FLUSH 100 UNIT/ML IV SOLN
500.0000 [IU] | Freq: Once | INTRAVENOUS | Status: AC
  Administered 2024-02-21: 500 [IU] via INTRAVENOUS
  Filled 2024-02-21: qty 5

## 2024-02-21 NOTE — Telephone Encounter (Signed)
 I called the number it rang and rang, went to voicemail and the voicemail is full so I cannot get in touch with her.  But she does have transportation set up for her today

## 2024-02-21 NOTE — Telephone Encounter (Signed)
 Called back and she answered this time and she says they have already got her in the car.  Should be a few minutes when they get here

## 2024-02-21 NOTE — Progress Notes (Signed)
Patient here for oncology follow-up appointment, concerns of neuropathy

## 2024-02-22 ENCOUNTER — Encounter: Payer: Self-pay | Admitting: Oncology

## 2024-02-22 NOTE — Progress Notes (Signed)
 Hematology/Oncology Consult note [facility]  Telephone:([phone] [phone]  Patient Care Team: Rory Collard, MD as PCP - General (Family Medicine) Avonne Boettcher, MD as Consulting Physician (Hematology and Oncology) Dillingham, Lindaann Requena, DO as Consulting Physician (Plastic Surgery) Glenis Langdon, MD as Consulting Physician (Radiation Oncology) Eldred Grego, MD as Consulting Physician (General Surgery)   Name of the patient: Vickie Mathis  [id number]  [birth year]   Date of visit: 02/22/24  Diagnosis- locally advanced 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 breast cancer at least T2 N1 M0 s/p neoadjuvant chemotherapy surgery and pathological complete response   Chief complaint/ Reason for visit-routine follow-up of breast cancer  Heme/Onc history:  patient is a 33 year old female who self palpated a left breast mass and sought medical attention recently after thinking it was a possible cyst for all this while.She underwent a diagnostic bilateral mammogram and ultrasound which showed a 2.7 cm mass at the 12 o'clock position 6 cm from the nipple.  Ultrasound of the left axilla demonstrates 2 lymph nodes with mild thickened cortices of 4 mm.  There is skin thickening on the lower inner quadrant of the left breast on mammography.  Both the breast mass and the lymph node were biopsied and was positive for invasive mammary carcinoma grade 3.  Lymph node was also suspicious for extracapsular extension.    ER/PR and HER-2 negative   MRI showed irregular enhancing mass at the 12 o'clock position of the left breast measuring 2.5 x 2.3 cm and a linear component extending 2.3 cm anteriorly.  The mass in the anterior linear extension combined measure 4.3 cm.  3.9 cm in the cephalocaudal dimension.  Also an area of clumped non-mass enhancement in the posterior aspect of the lower quadrant of the left breast measuring 2.6 x 0.9 cm which looks suspicious.  2 enlarged left  axillary lymph nodes and 2 mildly enlarged internal mammary lymph nodes.  4.3 cm clumped area of non-mass enhancement in the outer quadrant of the right breast.  3 right axillary lymph nodes with mild cortical thickening.   Patient underwent biopsy of the right breast non-mass enhancement and that was negative for malignancy   CT scan showed mildly enlarged right inguinal lymph nodes nonspecific.  Left upper breast mass with prominent left axillary lymph nodes.  Subcentimeter pulmonary nodules which are calcified and compatible with benign old granulomatous disease.  0.6 5.4 cm lucent lesion in the left first rib possibly a hemangioma or benign lesion.   Patient developed significant infusion reaction to carboplatin  with dose 9 when her blood pressure dropped and she became tachycardic and significantly nauseous.  She went on to complete [facility] Keytruda  chemotherapy as per keynote 522 regimen.  Patient underwent bilateral mastectomy with reconstruction in September 2022.  Final pathology showed complete pathological response2 sentinel lymph nodes negative for malignancy.  No malignancy noted in the right breast.   Plan was to complete 9 cycles of adjuvant Keytruda .  Patient noted to have abnormal LFTs on Keytruda .  Last dose of Keytruda  given on 10/08/2021.  Subsequently her LFTs were significantly elevated and patient did not receive any further Keytruda .  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  She has been managed by [facility] hepatology.  She is currently off steroids and mycophenolate.  Interval history-patient was prescribed naltrexone for alcohol dependence but she says she could not tolerate it and plans not presently using it.  She presently reports drinking very low concentration  alcoholic beverages but denies drinking any hard liquor or even beer.  Neuropathy in her hands and feet is presently stable.  ECOG PS- 1 Pain scale- 2 Opioid associated constipation-  no  Review of systems- Review of Systems  Constitutional:  Positive for malaise/fatigue. Negative for chills, fever and weight loss.  HENT:  Negative for congestion, ear discharge and nosebleeds.   Eyes:  Negative for blurred vision.  Respiratory:  Negative for cough, hemoptysis, sputum production, shortness of breath and wheezing.   Cardiovascular:  Negative for chest pain, palpitations, orthopnea and claudication.  Gastrointestinal:  Negative for abdominal pain, blood in stool, constipation, diarrhea, heartburn, melena, nausea and vomiting.  Genitourinary:  Negative for dysuria, flank pain, frequency, hematuria and urgency.  Musculoskeletal:  Negative for back pain, joint pain and myalgias.  Skin:  Negative for rash.  Neurological:  Positive for sensory change (Peripheral neuropathy). Negative for dizziness, tingling, focal weakness, seizures, weakness and headaches.  Endo/Heme/Allergies:  Does not bruise/bleed easily.  Psychiatric/Behavioral:  Negative for depression and suicidal ideas. The patient does not have insomnia.       Allergies  Allergen Reactions   Carboplatin  Shortness Of Breath, Nausea And Vomiting and Other (See Comments)    Flushing- chest , face, neck , arm including hand   Amoxicillin     Other reaction(s): "too young to remember what they do to me"   Sulfa Antibiotics     Other reaction(s): "too young to remember what they do to me"     Past Medical History:  Diagnosis Date   Anxiety    Asthma    Breast cancer (HCC) 11/2020   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 breast ca   Depression    Family history of cancer    History of chemotherapy    Varicose veins of bilateral lower extremities with pain      Past Surgical History:  Procedure Laterality Date   APPENDECTOMY  2018   BILATERAL TOTAL MASTECTOMY WITH AXILLARY LYMPH NODE DISSECTION Bilateral 06/25/2021   Procedure: BILATERAL TOTAL MASTECTOMY WITH LEFT AXILLARY LYMPH NODE BIOPSY VS. AXILLARY NODE DISSECTION;   Surgeon: Eldred Grego, MD;  Location: [facility] ORS;  Service: General;  Laterality: Bilateral;  Dillingham, 1.5 hours Cintron-Diaz 2.5 hours   BREAST BIOPSY Left 11/26/2020   vision 12:00 6cmfn [facility]   BREAST BIOPSY Left 11/26/2020   LN bx, hydro marker,  fragments of macrometastatic carcinoma   BREAST RECONSTRUCTION WITH PLACEMENT OF TISSUE EXPANDER AND FLEX HD (ACELLULAR HYDRATED DERMIS) Bilateral 06/25/2021   Procedure: BREAST RECONSTRUCTION WITH PLACEMENT OF TISSUE EXPANDER AND FLEX HD (ACELLULAR HYDRATED DERMIS);  Surgeon: Thornell Flirt, DO;  Location: [facility] ORS;  Service: Plastics;  Laterality: Bilateral;   PORTACATH PLACEMENT Right 12/13/2020   Procedure: INSERTION PORT-A-CATH;  Surgeon: Eldred Grego, MD;  Location: [facility] ORS;  Service: General;  Laterality: Right;   REMOVAL OF BILATERAL TISSUE EXPANDERS WITH PLACEMENT OF BILATERAL BREAST IMPLANTS Bilateral 09/15/2021   Procedure: REMOVAL OF BILATERAL TISSUE EXPANDERS;  Surgeon: Thornell Flirt, DO;  Location:  SURGERY CENTER;  Service: Plastics;  Laterality: Bilateral;    Social History   Socioeconomic History   Marital status: Married    Spouse name: Not on file   Number of children: Not on file   Years of education: Not on file   Highest education level: Not on file  Occupational History   Not on file  Tobacco Use   Smoking status: Never   Smokeless tobacco: Never  Vaping Use   Vaping  status: Never Used  Substance and Sexual Activity   Alcohol use: Not Currently    Comment: occassinally    Drug use: Yes    Types: Marijuana    Comment: occ   Sexual activity: Yes  Other Topics Concern   Not on file  Social History Narrative   Not on file   Social Drivers of Health   Financial Resource Strain: High Risk (07/01/2022)   Received from [facility], [facility]   Overall Financial Resource Strain (CARDIA)    Difficulty of Paying Living Expenses: Hard   Food Insecurity: No Food Insecurity (07/01/2022)   [facility], [facility]   Hunger Vital Sign    Worried About Running Out of Food in the Last Year: Never true    Ran Out of Food in the Last Year: Never true  Transportation Needs: Unmet Transportation Needs (01/22/2023)   PRAPARE - Administrator, Civil Service (Medical): Yes    Lack of Transportation (Non-Medical): Yes  Physical Activity: Not on file  Stress: Not on file  Social Connections: Not on file  Intimate Partner Violence: Unknown (01/29/2022)   [facility]   Intimate Partner Violence    Fear of Current or Ex-Partner: Not on file    Family History  Problem Relation Age of Onset   Diabetes Father    Varicose Veins Father    Diabetes Paternal Aunt    Uterine cancer Paternal Aunt        precancerous   Diabetes Paternal Grandmother    Uterine cancer Paternal Grandmother        precancerous   Thyroid  disease Mother    Thyroid  disease Maternal Aunt    Thyroid  disease Maternal Grandmother    Cancer Other        stomach vs ovarian/cervical   Cancer Paternal Great-grandmother        unk     Current Outpatient Medications:    busPIRone  (BUSPAR ) 15 MG tablet, Take 1 tablet by mouth 2 (two) times daily., Disp: , Rfl:    clonazePAM  (KLONOPIN ) 0.5 MG tablet, Take 1 tablet (0.5 mg total) by mouth 3 (three) times daily as needed., Disp: 60 tablet, Rfl: 0   DULoxetine (CYMBALTA) 60 MG capsule, Take 1 tablet by mouth daily., Disp: , Rfl:    gabapentin  (NEURONTIN ) 300 MG capsule, Take 1 capsule (300 mg total) by mouth 3 (three) times daily. (Patient taking differently: Take 300 mg by mouth 3 (three) times daily. PT STATES 600MG  IN PM), Disp: 42 capsule, Rfl: 3   INCASSIA 0.35 MG tablet, Take 1 tablet by mouth daily., Disp: , Rfl:    lidocaine -prilocaine  (EMLA ) cream, Apply 1 Application topically every 14 (fourteen) days., Disp: 30 g, Rfl: 2    Multiple Vitamin (MULTIVITAMIN WITH MINERALS) TABS tablet, Take 1 tablet by mouth daily., Disp: , Rfl:    rizatriptan (MAXALT) 10 MG tablet, Take 1 tab at onset of migraine. May repeat in 2 hours if needed. Max 3/day and 2-3 days/week, Disp: , Rfl:    traZODone  (DESYREL ) 50 MG tablet, Take 50 mg by mouth at bedtime., Disp: , Rfl:    acamprosate (CAMPRAL) 333 MG tablet, Take 666 mg by mouth 3 (three) times daily. (Patient not taking: Reported on 02/21/2024), Disp: , Rfl:    docusate sodium (COLACE) 100 MG capsule, Take 100 mg by mouth daily as needed for mild constipation. (Patient not taking: Reported  on 02/21/2024), Disp: , Rfl:    EMGALITY 120 MG/ML SOAJ, Inject 120 mg into the skin every 30 (thirty) days. (Patient not taking: Reported on 02/21/2024), Disp: , Rfl:    mycophenolate (CELLCEPT) 250 MG capsule, Take 250 mg by mouth 2 (two) times daily. (Patient not taking: Reported on 02/21/2024), Disp: , Rfl:    oxyCODONE  (OXY IR/ROXICODONE ) 5 MG immediate release tablet, Take 1 tablet (5 mg total) by mouth 2 (two) times daily. (Patient not taking: Reported on 02/21/2024), Disp: 60 tablet, Rfl: 0   predniSONE  (DELTASONE ) 10 MG tablet, Take 5 tablets daily x 2 days, then 4 tabs daily x 2 days, then 3 tabs daily x 2 days, then 2 tabs daily x 2 days, then 1 tab daily x 2 days, then 1/2 tab daily x 2 days, then stop. (Patient not taking: Reported on 02/21/2024), Disp: , Rfl:  No current facility-administered medications for this visit.  Facility-Administered Medications Ordered in Other Visits:    prochlorperazine  (COMPAZINE ) tablet 10 mg, 10 mg, Oral, Q6H PRN, Avonne Boettcher, MD, 10 mg at 07/16/21 1407  Physical exam:  Vitals:   02/21/24 1340  BP: (!) 141/88  Pulse: 90  Resp: 17  Temp: (!) 97.1 F (36.2 C)  TempSrc: Tympanic  SpO2: 98%  Weight: 215 lb (97.5 kg)   Physical Exam Cardiovascular:     Rate and Rhythm: Normal rate and regular rhythm.     Heart sounds: Normal heart sounds.  Pulmonary:      Effort: Pulmonary effort is normal.     Breath sounds: Normal breath sounds.  Abdominal:     General: Bowel sounds are normal.     Palpations: Abdomen is soft.  Skin:    General: Skin is warm and dry.  Neurological:     Mental Status: She is alert and oriented to person, place, and time.   Chest wall exam: Patient is status post bilateral mastectomy without reconstruction.  No evidence of chest wall recurrence.  No palpable bilateral axillary adenopathy.  I have personally reviewed labs listed below:    Latest Ref Rng & Units 02/21/2024    1:20 PM  CMP  Glucose 70 - 99 mg/dL 578   BUN 6 - 20 mg/dL 16   Creatinine 4.69 - 1.00 mg/dL 6.29   Sodium 528 - 413 mmol/L 134   Potassium 3.5 - 5.1 mmol/L 3.5   Chloride 98 - 111 mmol/L 103   CO2 22 - 32 mmol/L 23   Calcium 8.9 - 10.3 mg/dL 8.7   Total Protein 6.5 - 8.1 g/dL 7.2   Total Bilirubin 0.0 - 1.2 mg/dL 0.3   Alkaline Phos 38 - 126 U/L 83   AST 15 - 41 U/L 45   ALT 0 - 44 U/L 65       Latest Ref Rng & Units 02/21/2024    1:20 PM  CBC  WBC 4.0 - 10.5 K/uL 9.5   Hemoglobin 12.0 - 15.0 g/dL 24.4   Hematocrit 01.0 - 46.0 % 35.5   Platelets 150 - 400 K/uL 209       Assessment and plan- Patient is a 33 y.o. female with history of stage III 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 breast cancer s/p neoadjuvant chemotherapy with keynote 522 followed by bilateral mastectomy.  Patient had a complete pathological responseAnd also went through adjuvant radiation therapy.  She is here for routine follow-up of following issues  Breast cancer: Clinically in remission.  No suspicious signs and symptoms for recurrence.  Chest wall exam is unremarkable.  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  She is presently off steroids and mycophenolate.  Monitored by [facility] hepatology.  Alcohol dependence: Patient is  gradually weaning down on her alcohol intake.  She was prescribed naltrexone by [facility] hepatology but she has stopped using it.  History of anxiety and depression:She is on Klonopin   CBC with differential CMP in 3 and 6 months and I will see her back in 6 months.  She wishes to keep her port in place   Visit Diagnosis 1. Encounter for follow-up surveillance of breast cancer   2. Chemotherapy-induced peripheral neuropathy (HCC)   3. Autoimmune hepatitis (HCC)      Dr. Seretha Dance, MD, MPH [facility] at [facility] [phone] 02/22/2024 7:57 AM

## 2024-03-28 ENCOUNTER — Encounter: Payer: Self-pay | Admitting: Oncology

## 2024-03-28 NOTE — Telephone Encounter (Signed)
 Closing charts/Signing encounter from 12/01/23

## 2024-04-14 ENCOUNTER — Telehealth: Payer: Self-pay | Admitting: *Deleted

## 2024-04-14 NOTE — Telephone Encounter (Signed)
 Called today and said that she dropped off her paper that she needs for her work over a week and no one has called or reached out to her to get the paper and she needs it now.

## 2024-05-15 ENCOUNTER — Encounter: Payer: Self-pay | Admitting: Oncology

## 2024-05-15 DIAGNOSIS — C50919 Malignant neoplasm of unspecified site of unspecified female breast: Secondary | ICD-10-CM

## 2024-05-15 NOTE — Telephone Encounter (Signed)
 yes

## 2024-05-22 ENCOUNTER — Telehealth: Payer: Self-pay | Admitting: *Deleted

## 2024-05-22 ENCOUNTER — Inpatient Hospital Stay

## 2024-05-22 ENCOUNTER — Telehealth: Payer: Self-pay | Admitting: Oncology

## 2024-05-22 ENCOUNTER — Ambulatory Visit: Payer: Self-pay | Admitting: Oncology

## 2024-05-22 ENCOUNTER — Inpatient Hospital Stay: Attending: Oncology

## 2024-05-22 DIAGNOSIS — C50919 Malignant neoplasm of unspecified site of unspecified female breast: Secondary | ICD-10-CM

## 2024-05-22 DIAGNOSIS — K754 Autoimmune hepatitis: Secondary | ICD-10-CM

## 2024-05-22 DIAGNOSIS — Z853 Personal history of malignant neoplasm of breast: Secondary | ICD-10-CM | POA: Insufficient documentation

## 2024-05-22 DIAGNOSIS — E785 Hyperlipidemia, unspecified: Secondary | ICD-10-CM

## 2024-05-22 LAB — LIPID PANEL
Cholesterol: 158 mg/dL (ref 0–200)
HDL: 80 mg/dL (ref >40)
LDL Cholesterol: 67 mg/dL (ref 0–99)
Total CHOL/HDL Ratio: 2 ratio
Triglycerides: 54 mg/dL (ref <150)
VLDL: 11 mg/dL (ref 0–40)

## 2024-05-22 LAB — CBC WITH DIFFERENTIAL/PLATELET
Abs Immature Granulocytes: 0.01 K/uL (ref 0.00–0.07)
Basophils Absolute: 0.1 K/uL (ref 0.0–0.1)
Basophils Relative: 1 %
Eosinophils Absolute: 0.1 K/uL (ref 0.0–0.5)
Eosinophils Relative: 1 %
HCT: 35.8 % — ABNORMAL LOW (ref 36.0–46.0)
Hemoglobin: 12.9 g/dL (ref 12.0–15.0)
Immature Granulocytes: 0 %
Lymphocytes Relative: 15 %
Lymphs Abs: 0.9 K/uL (ref 0.7–4.0)
MCH: 33.7 pg (ref 26.0–34.0)
MCHC: 36 g/dL (ref 30.0–36.0)
MCV: 93.5 fL (ref 80.0–100.0)
Monocytes Absolute: 0.4 K/uL (ref 0.1–1.0)
Monocytes Relative: 6 %
Neutro Abs: 4.8 K/uL (ref 1.7–7.7)
Neutrophils Relative %: 77 %
Platelets: 154 K/uL (ref 150–400)
RBC: 3.83 MIL/uL — ABNORMAL LOW (ref 3.87–5.11)
RDW: 12.2 % (ref 11.5–15.5)
WBC: 6.3 K/uL (ref 4.0–10.5)
nRBC: 0 % (ref 0.0–0.2)

## 2024-05-22 LAB — COMPREHENSIVE METABOLIC PANEL WITH GFR
ALT: 237 U/L — ABNORMAL HIGH (ref 0–44)
AST: 218 U/L (ref 15–41)
Albumin: 4.4 g/dL (ref 3.5–5.0)
Alkaline Phosphatase: 96 U/L (ref 38–126)
Anion gap: 8 (ref 5–15)
BUN: 8 mg/dL (ref 6–20)
CO2: 25 mmol/L (ref 22–32)
Calcium: 9.1 mg/dL (ref 8.9–10.3)
Chloride: 102 mmol/L (ref 98–111)
Creatinine, Ser: 0.72 mg/dL (ref 0.44–1.00)
GFR, Estimated: >60 mL/min (ref >60)
Glucose, Bld: 95 mg/dL (ref 70–99)
Potassium: 3.5 mmol/L (ref 3.5–5.1)
Sodium: 135 mmol/L (ref 135–145)
Total Bilirubin: 0.6 mg/dL (ref 0.0–1.2)
Total Protein: 7.2 g/dL (ref 6.5–8.1)

## 2024-05-22 LAB — VITAMIN D 25 HYDROXY (VIT D DEFICIENCY, FRACTURES): Vit D, 25-Hydroxy: 32.06 ng/mL (ref 30–100)

## 2024-05-22 NOTE — Telephone Encounter (Signed)
 CMP results sent.  Including critical AST and elevated ALT.

## 2024-05-22 NOTE — Telephone Encounter (Signed)
 Lab results send to Dr Glover @ [facility] in [place] as requested by physician.

## 2024-05-22 NOTE — Telephone Encounter (Signed)
 RN called lab per request for critical results. AST [id number] Dr Melanee notified.

## 2024-05-22 NOTE — Telephone Encounter (Signed)
 I can see her anytime in the next 2 to 3 weeks

## 2024-05-22 NOTE — Telephone Encounter (Signed)
 Patient had her light duty paperwork to be signed by MD. Pt also mentions wanting to move up her appt with Dr Melanee as she is experiencing chest tightness. Denies actual pain. States she was seen last week by PCP and he didn't find anything. I instructed pt to call PCP back to state she was still symptomatic. I also mentioned urgent care would be appropriate as well. Patient would still like to mover her appt up from November. Dr Melanee aware of conversation I had with patient.

## 2024-05-22 NOTE — Telephone Encounter (Signed)
 Pt was here for lab only appt, she stated she is having chest pains. I went back to the pod and spoke with Rosaline FURY RN about this. She came and talked with pt and advised her to go to urgent care and contact her PCP.  Pt stated she has contacted her PCP and they did an EKG on her and it was normal. RN told pt to reach back out to PCP and let them know that the problem is still there.     Pt would like to see Dr.Rao sooner than her currently scheduled appt in November. Please advise and contact pt for scheduling. Pt stated first thing in the morning or last thing in the afternoon for her appts.

## 2024-05-23 LAB — THYROID PANEL WITH TSH
Free Thyroxine Index: 1.2 (ref 1.2–4.9)
T3 Uptake Ratio: 25 % (ref 24–39)
T4, Total: 4.9 ug/dL (ref 4.5–12.0)
TSH: 1.8 u[IU]/mL (ref 0.450–4.500)

## 2024-06-13 ENCOUNTER — Telehealth: Payer: Self-pay | Admitting: *Deleted

## 2024-06-13 NOTE — Telephone Encounter (Signed)
 Patient sent a paper to fill out for medical termination and restriction assessment and it was sent out today by fax.  [phone]

## 2024-06-14 ENCOUNTER — Telehealth: Payer: Self-pay | Admitting: *Deleted

## 2024-06-14 NOTE — Telephone Encounter (Signed)
 I needed  have the amount for activity, frequency and the numbers for each and it has been done an faxed to he gave [phone]

## 2024-06-20 ENCOUNTER — Telehealth: Payer: Self-pay

## 2024-06-20 ENCOUNTER — Inpatient Hospital Stay (HOSPITAL_BASED_OUTPATIENT_CLINIC_OR_DEPARTMENT_OTHER): Admitting: Oncology

## 2024-06-20 ENCOUNTER — Inpatient Hospital Stay

## 2024-06-20 ENCOUNTER — Inpatient Hospital Stay: Attending: Oncology

## 2024-06-20 ENCOUNTER — Encounter: Payer: Self-pay | Admitting: Oncology

## 2024-06-20 VITALS — BP 133/80 | HR 77 | Temp 98.6°F | Resp 20 | Wt 198.9 lb

## 2024-06-20 DIAGNOSIS — Z853 Personal history of malignant neoplasm of breast: Secondary | ICD-10-CM | POA: Diagnosis present

## 2024-06-20 DIAGNOSIS — R7989 Other specified abnormal findings of blood chemistry: Secondary | ICD-10-CM | POA: Diagnosis not present

## 2024-06-20 DIAGNOSIS — Z8049 Family history of malignant neoplasm of other genital organs: Secondary | ICD-10-CM | POA: Diagnosis not present

## 2024-06-20 DIAGNOSIS — Z9013 Acquired absence of bilateral breasts and nipples: Secondary | ICD-10-CM | POA: Diagnosis not present

## 2024-06-20 DIAGNOSIS — Z9221 Personal history of antineoplastic chemotherapy: Secondary | ICD-10-CM | POA: Insufficient documentation

## 2024-06-20 DIAGNOSIS — K754 Autoimmune hepatitis: Secondary | ICD-10-CM

## 2024-06-20 DIAGNOSIS — Z08 Encounter for follow-up examination after completed treatment for malignant neoplasm: Secondary | ICD-10-CM

## 2024-06-20 LAB — CBC WITH DIFFERENTIAL/PLATELET
Abs Immature Granulocytes: 0.04 K/uL (ref 0.00–0.07)
Basophils Absolute: 0.1 K/uL (ref 0.0–0.1)
Basophils Relative: 1 %
Eosinophils Absolute: 0.1 K/uL (ref 0.0–0.5)
Eosinophils Relative: 1 %
HCT: 37.8 % (ref 36.0–46.0)
Hemoglobin: 13.3 g/dL (ref 12.0–15.0)
Immature Granulocytes: 0 %
Lymphocytes Relative: 13 %
Lymphs Abs: 1.5 K/uL (ref 0.7–4.0)
MCH: 33 pg (ref 26.0–34.0)
MCHC: 35.2 g/dL (ref 30.0–36.0)
MCV: 93.8 fL (ref 80.0–100.0)
Monocytes Absolute: 0.8 K/uL (ref 0.1–1.0)
Monocytes Relative: 7 %
Neutro Abs: 8.8 K/uL — ABNORMAL HIGH (ref 1.7–7.7)
Neutrophils Relative %: 78 %
Platelets: 202 K/uL (ref 150–400)
RBC: 4.03 MIL/uL (ref 3.87–5.11)
RDW: 12.2 % (ref 11.5–15.5)
WBC: 11.3 K/uL — ABNORMAL HIGH (ref 4.0–10.5)
nRBC: 0 % (ref 0.0–0.2)

## 2024-06-20 LAB — COMPREHENSIVE METABOLIC PANEL WITH GFR
ALT: 56 U/L — ABNORMAL HIGH (ref 0–44)
AST: 41 U/L (ref 15–41)
Albumin: 4.7 g/dL (ref 3.5–5.0)
Alkaline Phosphatase: 81 U/L (ref 38–126)
Anion gap: 9 (ref 5–15)
BUN: 13 mg/dL (ref 6–20)
CO2: 22 mmol/L (ref 22–32)
Calcium: 9.9 mg/dL (ref 8.9–10.3)
Chloride: 103 mmol/L (ref 98–111)
Creatinine, Ser: 0.82 mg/dL (ref 0.44–1.00)
GFR, Estimated: >60 mL/min (ref >60)
Glucose, Bld: 98 mg/dL (ref 70–99)
Potassium: 3.8 mmol/L (ref 3.5–5.1)
Sodium: 134 mmol/L — ABNORMAL LOW (ref 135–145)
Total Bilirubin: 0.7 mg/dL (ref 0.0–1.2)
Total Protein: 7.9 g/dL (ref 6.5–8.1)

## 2024-06-20 NOTE — Telephone Encounter (Signed)
 Called patient to confirm appointment; no answer.

## 2024-06-25 ENCOUNTER — Encounter: Payer: Self-pay | Admitting: Oncology

## 2024-06-25 NOTE — Progress Notes (Signed)
 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  Ultrasound of the left axilla demonstrates 2 lymph nodes with mild thickened cortices of 4 mm.  There is skin thickening on the lower inner quadrant of the left breast on mammography.  Both the breast mass and the lymph node were biopsied and was positive for invasive mammary carcinoma grade 3.  Lymph node was also suspicious for extracapsular extension.    ER/PR and HER-2 negative   MRI showed irregular enhancing mass at the 12 o'clock position of the left breast measuring 2.5 x 2.3 cm and a linear component extending 2.3 cm anteriorly.  The mass in the anterior linear extension combined measure 4.3 cm.  3.9 cm in the cephalocaudal dimension.  Also an area of clumped non-mass enhancement in the posterior aspect of the lower quadrant of the left breast measuring 2.6 x 0.9 cm which looks suspicious.  2  enlarged left axillary lymph nodes and 2 mildly enlarged internal mammary lymph nodes.  4.3 cm clumped area of non-mass enhancement in the outer quadrant of the right breast.  3 right axillary lymph nodes with mild cortical thickening.   Patient underwent biopsy of the right breast non-mass enhancement and that was negative for malignancy   CT scan showed mildly enlarged right inguinal lymph nodes nonspecific.  Left upper breast mass with prominent left axillary lymph nodes.  Subcentimeter pulmonary nodules which are calcified and compatible with benign old granulomatous disease.  0.6 5.4 cm lucent lesion in the left first rib possibly a hemangioma or benign lesion.   Patient developed significant infusion reaction to carboplatin  with dose 9 when her blood pressure dropped and she became tachycardic and significantly nauseous.  She went on to complete [facility] Keytruda  chemotherapy as per keynote 522 regimen.  Patient underwent bilateral mastectomy with reconstruction in September 2022.  Final pathology showed complete pathological response2 sentinel lymph nodes negative for malignancy.  No malignancy noted in the right breast.   Plan was to complete 9 cycles of adjuvant Keytruda .  Patient noted to have abnormal LFTs on Keytruda .  Last dose of Keytruda  given on 10/08/2021.  Subsequently her LFTs were significantly elevated and patient did not receive any further Keytruda .  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  She has been managed by [facility] hepatology.  She is currently off steroids and mycophenolate.  Interval history-patient is trying to cut down on her alcohol intake and is currently on acamprosate through [facility] hepatology which she is unable to take consistently.  She  is on gabapentin  for chronic neuropathy.  She reports occasional right-sided chest wall pain which comes and goes  ECOG PS- 1 Pain scale- 3   Review of systems- Review of Systems  Constitutional:   Positive for malaise/fatigue. Negative for chills, fever and weight loss.  HENT:  Negative for congestion, ear discharge and nosebleeds.   Eyes:  Negative for blurred vision.  Respiratory:  Negative for cough, hemoptysis, sputum production, shortness of breath and wheezing.   Cardiovascular:  Negative for chest pain, palpitations, orthopnea and claudication.  Gastrointestinal:  Negative for abdominal pain, blood in stool, constipation, diarrhea, heartburn, melena, nausea and vomiting.  Genitourinary:  Negative for dysuria, flank pain, frequency, hematuria and urgency.  Musculoskeletal:  Negative for back pain, joint pain and myalgias.  Skin:  Negative for rash.  Neurological:  Positive for sensory change (Peripheral neuropathy). Negative for dizziness, tingling, focal weakness, seizures, weakness and headaches.  Endo/Heme/Allergies:  Does not bruise/bleed easily.  Psychiatric/Behavioral:  Negative for depression and suicidal ideas. The patient does not have insomnia.       Allergies  Allergen Reactions   Carboplatin  Shortness Of Breath, Nausea And Vomiting and Other (See Comments)    Flushing- chest , face, neck , arm including hand   Amoxicillin     Other reaction(s): too young to remember what they do to me   Sulfa Antibiotics     Other reaction(s): too young to remember what they do to me     Past Medical History:  Diagnosis Date   Anxiety    Asthma    Breast cancer (HCC) 11/2020   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 breast ca   Depression    Family history of cancer    History of chemotherapy    Varicose veins of bilateral lower extremities with pain      Past Surgical History:  Procedure Laterality Date   APPENDECTOMY  2018   BILATERAL TOTAL MASTECTOMY WITH AXILLARY LYMPH NODE DISSECTION Bilateral 06/25/2021   Procedure: BILATERAL TOTAL MASTECTOMY WITH LEFT AXILLARY LYMPH NODE BIOPSY VS. AXILLARY NODE DISSECTION;  Surgeon: Rodolph Romano, MD;  Location: [facility] ORS;  Service:  General;  Laterality: Bilateral;  Dillingham, 1.5 hours Cintron-Diaz 2.5 hours   BREAST BIOPSY Left 11/26/2020   vision 12:00 6cmfn [facility]   BREAST BIOPSY Left 11/26/2020   LN bx, hydro marker,  fragments of macrometastatic carcinoma   BREAST RECONSTRUCTION WITH PLACEMENT OF TISSUE EXPANDER AND FLEX HD (ACELLULAR HYDRATED DERMIS) Bilateral 06/25/2021   Procedure: BREAST RECONSTRUCTION WITH PLACEMENT OF TISSUE EXPANDER AND FLEX HD (ACELLULAR HYDRATED DERMIS);  Surgeon: Lowery Estefana RAMAN, DO;  Location: [facility] ORS;  Service: Plastics;  Laterality: Bilateral;   PORTACATH PLACEMENT Right 12/13/2020   Procedure: INSERTION PORT-A-CATH;  Surgeon: Rodolph Romano, MD;  Location: [facility] ORS;  Service: General;  Laterality: Right;   REMOVAL OF BILATERAL TISSUE EXPANDERS WITH PLACEMENT OF BILATERAL BREAST IMPLANTS Bilateral 09/15/2021   Procedure: REMOVAL OF BILATERAL TISSUE EXPANDERS;  Surgeon: Lowery Estefana RAMAN, DO;  Location: [facility];  Service: Plastics;  Laterality: Bilateral;    Social History   Socioeconomic History   Marital status: Married    Spouse name: Not on file   Number of children: Not on file   Years of education: Not on file   Highest education level: Not on file  Occupational History   Not on file  Tobacco Use   Smoking status: Never   Smokeless tobacco: Never  Vaping Use   Vaping status: Never Used  Substance and  Sexual Activity   Alcohol use: Not Currently    Comment: occassinally    Drug use: Yes    Types: Marijuana    Comment: occ   Sexual activity: Yes  Other Topics Concern   Not on file  Social History Narrative   Not on file   Social Drivers of Health   Financial Resource Strain: Patient Declined (05/10/2024)   Received from [facility]   Overall Financial Resource Strain (CARDIA)    Difficulty of Paying Living Expenses: Patient declined  Food Insecurity: Patient Declined (05/10/2024)   Received from [facility]   Hunger Vital Sign    Within the past 12 months, you worried that your food would run out before you got the money to buy more.: Patient declined    Within the past 12 months, the food you bought just didn't last and you didn't have money to get more.: Patient declined  Transportation Needs: Patient Declined (05/10/2024)   Received from [facility] - Transportation    In the past 12 months, has lack of transportation kept you from medical appointments or from getting medications?: Patient declined    Lack of Transportation (Non-Medical): Patient declined  Physical Activity: Not on file  Stress: Not on file  Social Connections: Not on file  Intimate Partner Violence: Unknown (01/29/2022)   Received from [facility]   Intimate Partner Violence    Fear of Current or Ex-Partner: Not on file    Family History  Problem Relation Age of Onset   Diabetes Father    Varicose Veins Father    Diabetes Paternal Aunt    Uterine cancer Paternal Aunt        precancerous   Diabetes Paternal Grandmother    Uterine cancer Paternal Grandmother        precancerous   Thyroid  disease Mother    Thyroid  disease Maternal Aunt    Thyroid  disease Maternal Grandmother    Cancer Other        stomach vs ovarian/cervical   Cancer Paternal Great-grandmother        unk     Current Outpatient Medications:    busPIRone  (BUSPAR ) 15 MG tablet, Take 1 tablet by mouth 2 (two) times daily., Disp: , Rfl:    clonazePAM  (KLONOPIN ) 0.5 MG tablet, Take 1 tablet (0.5 mg total) by mouth 3 (three) times daily as needed., Disp: 60 tablet, Rfl: 0   gabapentin  (NEURONTIN ) 300 MG capsule, Take 1 capsule (300 mg total) by mouth 3 (three) times daily. (Patient taking differently: Take 300 mg by mouth 3 (three) times daily. PT STATES 600MG  IN PM), Disp: 42 capsule, Rfl: 3   INCASSIA 0.35 MG tablet, Take 1 tablet by mouth daily., Disp: , Rfl:    lidocaine -prilocaine  (EMLA ) cream, Apply 1  Application topically every 14 (fourteen) days., Disp: 30 g, Rfl: 2   Multiple Vitamin (MULTIVITAMIN WITH MINERALS) TABS tablet, Take 1 tablet by mouth daily., Disp: , Rfl:    rizatriptan (MAXALT) 10 MG tablet, Take 1 tab at onset of migraine. May repeat in 2 hours if needed. Max 3/day and 2-3 days/week, Disp: , Rfl:    sertraline (ZOLOFT) 100 MG tablet, Take 100 mg by mouth., Disp: , Rfl:    traZODone  (DESYREL ) 50 MG tablet, Take 50 mg by mouth at bedtime., Disp: , Rfl:    acamprosate (CAMPRAL) 333 MG tablet, Take 666 mg by mouth 3 (three) times daily. (Patient not taking: Reported  on 06/20/2024), Disp: , Rfl:    docusate sodium (COLACE) 100 MG capsule, Take 100 mg by mouth daily as needed for mild constipation. (Patient not taking: Reported on 06/20/2024), Disp: , Rfl:    DULoxetine (CYMBALTA) 60 MG capsule, Take 1 tablet by mouth daily. (Patient not taking: Reported on 06/20/2024), Disp: , Rfl:    EMGALITY 120 MG/ML SOAJ, Inject 120 mg into the skin every 30 (thirty) days. (Patient not taking: Reported on 06/20/2024), Disp: , Rfl:    mycophenolate (CELLCEPT) 250 MG capsule, Take 250 mg by mouth 2 (two) times daily. (Patient not taking: Reported on 06/20/2024), Disp: , Rfl:    oxyCODONE  (OXY IR/ROXICODONE ) 5 MG immediate release tablet, Take 1 tablet (5 mg total) by mouth 2 (two) times daily. (Patient not taking: Reported on 06/20/2024), Disp: 60 tablet, Rfl: 0   predniSONE  (DELTASONE ) 10 MG tablet, Take 5 tablets daily x 2 days, then 4 tabs daily x 2 days, then 3 tabs daily x 2 days, then 2 tabs daily x 2 days, then 1 tab daily x 2 days, then 1/2 tab daily x 2 days, then stop. (Patient not taking: Reported on 06/20/2024), Disp: , Rfl:  No current facility-administered medications for this visit.  Facility-Administered Medications Ordered in Other Visits:    prochlorperazine  (COMPAZINE ) tablet 10 mg, 10 mg, Oral, Q6H PRN, Melanee Annah BROCKS, MD, 10 mg at 07/16/21 1407  Physical exam:  Vitals:   06/20/24  1518  BP: 133/80  Pulse: 77  Resp: 20  Temp: 98.6 F (37 C)  SpO2: 100%  Weight: 198 lb 14.4 oz (90.2 kg)   Physical Exam Cardiovascular:     Rate and Rhythm: Normal rate and regular rhythm.     Heart sounds: Normal heart sounds.  Pulmonary:     Effort: Pulmonary effort is normal.     Breath sounds: Normal breath sounds.  Skin:    General: Skin is warm and dry.  Neurological:     Mental Status: She is alert and oriented to person, place, and time.   Chest wall exam: Patient is status post bilateral mastectomy without reconstruction.  No evidence of chest wall recurrence.  No palpable bilateral axillary adenopathy.  I have personally reviewed labs listed below:    Latest Ref Rng & Units 06/20/2024    2:58 PM  CMP  Glucose 70 - 99 mg/dL 98   BUN 6 - 20 mg/dL 13   Creatinine 9.55 - 1.00 mg/dL 9.17   Sodium 864 - 854 mmol/L 134   Potassium 3.5 - 5.1 mmol/L 3.8   Chloride 98 - 111 mmol/L 103   CO2 22 - 32 mmol/L 22   Calcium 8.9 - 10.3 mg/dL 9.9   Total Protein 6.5 - 8.1 g/dL 7.9   Total Bilirubin 0.0 - 1.2 mg/dL 0.7   Alkaline Phos 38 - 126 U/L 81   AST 15 - 41 U/L 41   ALT 0 - 44 U/L 56       Latest Ref Rng & Units 06/20/2024    2:58 PM  CBC  WBC 4.0 - 10.5 K/uL 11.3   Hemoglobin 12.0 - 15.0 g/dL 86.6   Hematocrit 63.9 - 46.0 % 37.8   Platelets 150 - 400 K/uL 202     Assessment and plan- Patient is a 33 y.o. female  with history of stage III 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 breast cancer s/p neoadjuvant chemotherapy with keynote 522 followed by bilateral mastectomy.  Patient had a complete pathological responseAnd also went  through adjuvant radiation therapy.  She is here for a routine surveillance visit for breast cancer  Patient completed neoadjuvant chemotherapy in August 2022 followed by bilateral mastectomy which showed complete pathological response.  She subsequently had 2 cycles of adjuvant Keytruda  but we had to stop it given autoimmune hepatitis for which she was on  high-dose steroids and mycophenolate for a while.  This was being followed by [facility] hepatology and presently she is off immunosuppression.  With regards to breast cancer this is her third year of surveillance.  Clinically she is doing well with no concerning signs and symptoms of recurrence based on today's exam.  If patient continues to have worsening right chest wall pain I will plan to get MRI of her chest wall.  She will let us  know how she is doing over the next couple of weeks.  Abnormal LFTs: They are better today as compared to what they were 1 month ago and AST has come down from 218-41 and 237 ALT to 56 presently.  This is secondary to ongoing alcohol intake which she is trying to cut down.  CMP in 6 weeks.  CBC with differential and CMP in 3 and 6 months and I will see her back in 6 months   Visit Diagnosis 1. Encounter for follow-up surveillance of breast cancer      Dr. Annah Skene, MD, MPH [facility] [phone] 06/25/2024 8:49 PM

## 2024-06-26 ENCOUNTER — Encounter: Payer: Self-pay | Admitting: Oncology

## 2024-06-29 ENCOUNTER — Encounter: Payer: Self-pay | Admitting: Oncology

## 2024-07-18 ENCOUNTER — Encounter: Payer: Self-pay | Admitting: Oncology

## 2024-07-20 ENCOUNTER — Telehealth: Payer: Self-pay | Admitting: *Deleted

## 2024-07-20 DIAGNOSIS — C50012 Malignant neoplasm of nipple and areola, left female breast: Secondary | ICD-10-CM

## 2024-07-20 DIAGNOSIS — R0789 Other chest pain: Secondary | ICD-10-CM

## 2024-07-20 NOTE — Telephone Encounter (Signed)
 Spoke with MRI dept, who recommends MRI chest to further work up the chest wall pain.

## 2024-07-20 NOTE — Telephone Encounter (Signed)
 Ok to order mri chest wall given her ongoing pain. Powell you can reach out to radiology regarding what exact order to enter

## 2024-07-20 NOTE — Telephone Encounter (Signed)
 Can you please get that scheduled

## 2024-07-21 ENCOUNTER — Encounter: Payer: Self-pay | Admitting: Oncology

## 2024-07-21 NOTE — Telephone Encounter (Signed)
 Order placed for mri chest wall

## 2024-07-28 ENCOUNTER — Ambulatory Visit
Admission: RE | Admit: 2024-07-28 | Discharge: 2024-07-28 | Disposition: A | Discharge status: Home / Self Care | Source: Ambulatory Visit | Attending: Oncology | Admitting: Oncology

## 2024-07-28 ENCOUNTER — Ambulatory Visit
Admission: RE | Admit: 2024-07-28 | Discharge: 2024-07-28 | Disposition: A | Discharge status: Home / Self Care | Attending: Oncology | Admitting: Oncology

## 2024-07-28 ENCOUNTER — Encounter: Payer: Self-pay | Admitting: *Deleted

## 2024-07-28 ENCOUNTER — Telehealth: Payer: Self-pay | Admitting: *Deleted

## 2024-07-28 ENCOUNTER — Inpatient Hospital Stay: Attending: Oncology

## 2024-07-28 ENCOUNTER — Encounter: Payer: Self-pay | Admitting: Oncology

## 2024-07-28 DIAGNOSIS — Z170 Estrogen receptor positive status [ER+]: Secondary | ICD-10-CM | POA: Insufficient documentation

## 2024-07-28 DIAGNOSIS — Z95828 Presence of other vascular implants and grafts: Secondary | ICD-10-CM | POA: Diagnosis present

## 2024-07-28 DIAGNOSIS — C50012 Malignant neoplasm of nipple and areola, left female breast: Secondary | ICD-10-CM | POA: Insufficient documentation

## 2024-07-28 DIAGNOSIS — R52 Pain, unspecified: Secondary | ICD-10-CM

## 2024-07-28 DIAGNOSIS — Z853 Personal history of malignant neoplasm of breast: Secondary | ICD-10-CM | POA: Insufficient documentation

## 2024-07-28 DIAGNOSIS — Z452 Encounter for adjustment and management of vascular access device: Secondary | ICD-10-CM | POA: Insufficient documentation

## 2024-07-28 NOTE — Telephone Encounter (Signed)
 I had spoken to Vickie Mathis about it. If the port is working well and not infected, there is not much we can do. If discomfort continues, she can always get it out since she is more than a year out of her breast cancer treatment.

## 2024-07-28 NOTE — Telephone Encounter (Signed)
 Patient called today and said that she is having port problems.  He says it is painful told that it and then she said as pulling/tugging around it.  Also she says it goes to her neck shoulder and head.  She wants to have an appointment today what could be done what is the problem.  I spoke to Dr. She says that we can get x-ray for see if there is anything going around on the port and after that she can get a port flush and see what that is doing and we will go from there on the x-ray and the port insertion.then she will come to get flushed  to see if it doing good. She says she will be here 3:15 to 3:30.

## 2024-07-31 ENCOUNTER — Telehealth: Payer: Self-pay

## 2024-07-31 ENCOUNTER — Encounter: Payer: Self-pay | Admitting: Oncology

## 2024-07-31 NOTE — Telephone Encounter (Signed)
 Per Dr. Melanee her port is placed properly. If she is continuing to have issues with her port that she wanted get it taken out or keep it as it is. Patient can choose to have port taken out now or wait until the MRI is completed (scheduled for 10/29 at 5:00pm). Also we cannot provide a work note for Saturday since the patient was not seen.

## 2024-08-01 ENCOUNTER — Inpatient Hospital Stay: Attending: Oncology

## 2024-08-01 ENCOUNTER — Inpatient Hospital Stay

## 2024-08-01 ENCOUNTER — Other Ambulatory Visit: Payer: Self-pay

## 2024-08-01 ENCOUNTER — Encounter: Payer: Self-pay | Admitting: Oncology

## 2024-08-01 DIAGNOSIS — K754 Autoimmune hepatitis: Secondary | ICD-10-CM

## 2024-08-01 DIAGNOSIS — Z853 Personal history of malignant neoplasm of breast: Secondary | ICD-10-CM | POA: Diagnosis present

## 2024-08-01 DIAGNOSIS — Z452 Encounter for adjustment and management of vascular access device: Secondary | ICD-10-CM | POA: Diagnosis present

## 2024-08-01 LAB — COMPREHENSIVE METABOLIC PANEL WITH GFR
ALT: 59 U/L — ABNORMAL HIGH (ref 0–44)
AST: 51 U/L — ABNORMAL HIGH (ref 15–41)
Albumin: 4.4 g/dL (ref 3.5–5.0)
Alkaline Phosphatase: 77 U/L (ref 38–126)
Anion gap: 9 (ref 5–15)
BUN: 9 mg/dL (ref 6–20)
CO2: 23 mmol/L (ref 22–32)
Calcium: 9.2 mg/dL (ref 8.9–10.3)
Chloride: 100 mmol/L (ref 98–111)
Creatinine, Ser: 0.7 mg/dL (ref 0.44–1.00)
GFR, Estimated: >60 mL/min (ref >60)
Glucose, Bld: 96 mg/dL (ref 70–99)
Potassium: 3.6 mmol/L (ref 3.5–5.1)
Sodium: 132 mmol/L — ABNORMAL LOW (ref 135–145)
Total Bilirubin: 0.6 mg/dL (ref 0.0–1.2)
Total Protein: 7.2 g/dL (ref 6.5–8.1)

## 2024-08-01 LAB — CBC WITH DIFFERENTIAL/PLATELET
Abs Immature Granulocytes: 0.06 K/uL (ref 0.00–0.07)
Basophils Absolute: 0 K/uL (ref 0.0–0.1)
Basophils Relative: 0 %
Eosinophils Absolute: 0 K/uL (ref 0.0–0.5)
Eosinophils Relative: 0 %
HCT: 34.8 % — ABNORMAL LOW (ref 36.0–46.0)
Hemoglobin: 12.6 g/dL (ref 12.0–15.0)
Immature Granulocytes: 1 %
Lymphocytes Relative: 12 %
Lymphs Abs: 1.3 K/uL (ref 0.7–4.0)
MCH: 33.9 pg (ref 26.0–34.0)
MCHC: 36.2 g/dL — ABNORMAL HIGH (ref 30.0–36.0)
MCV: 93.5 fL (ref 80.0–100.0)
Monocytes Absolute: 0.8 K/uL (ref 0.1–1.0)
Monocytes Relative: 7 %
Neutro Abs: 9.1 K/uL — ABNORMAL HIGH (ref 1.7–7.7)
Neutrophils Relative %: 80 %
Platelets: 170 K/uL (ref 150–400)
RBC: 3.72 MIL/uL — ABNORMAL LOW (ref 3.87–5.11)
RDW: 12.5 % (ref 11.5–15.5)
WBC: 11.2 K/uL — ABNORMAL HIGH (ref 4.0–10.5)
nRBC: 0 % (ref 0.0–0.2)

## 2024-08-01 MED ORDER — LIDOCAINE-PRILOCAINE 2.5-2.5 % EX CREA: 1.0000 | TOPICAL_CREAM | CUTANEOUS | 2 refills | Status: AC

## 2024-08-01 NOTE — Telephone Encounter (Signed)
 Spoke to patient who will come in today at 3:00pm for labs.  Informed to be ready at 2:30 for fleeta pick up transporation; patient verbalized understanding.

## 2024-08-01 NOTE — Telephone Encounter (Signed)
 Per secure chat pt is on the phone and states she only got her port flushed on friday and she still needs her labs. she noticed her appt was cancelled for today and she is on the way. can the pt still come today for her labs? she states she took off and no one notified her that her appt was cancelled.  Patient seen in clinic Friday 10/24 for port flush; labs were apparently not drawn on that day.  Per rao's last OV note 9/16 CMP in 6 weeks. CBC with differential and CMP in 3 and 6 months and I will see her back in 6 months.

## 2024-08-02 ENCOUNTER — Ambulatory Visit: Payer: Self-pay | Admitting: Oncology

## 2024-08-02 ENCOUNTER — Ambulatory Visit
Admission: RE | Admit: 2024-08-02 | Discharge: 2024-08-02 | Disposition: A | Discharge status: Home / Self Care | Source: Ambulatory Visit | Attending: Oncology | Admitting: Oncology

## 2024-08-02 DIAGNOSIS — C50012 Malignant neoplasm of nipple and areola, left female breast: Secondary | ICD-10-CM | POA: Insufficient documentation

## 2024-08-02 DIAGNOSIS — Z170 Estrogen receptor positive status [ER+]: Secondary | ICD-10-CM | POA: Diagnosis present

## 2024-08-02 DIAGNOSIS — R0789 Other chest pain: Secondary | ICD-10-CM | POA: Insufficient documentation

## 2024-08-02 MED ORDER — GADOBUTROL 1 MMOL/ML IV SOLN
9.0000 mL | Freq: Once | INTRAVENOUS | Status: AC | PRN
  Administered 2024-08-02: 9 mL via INTRAVENOUS

## 2024-08-04 ENCOUNTER — Ambulatory Visit: Payer: Self-pay | Admitting: Oncology

## 2024-08-04 ENCOUNTER — Encounter: Payer: Self-pay | Admitting: Oncology

## 2024-08-04 NOTE — Telephone Encounter (Signed)
-----   Message from Annah JAYSON Skene sent at 08/04/2024  8:26 AM EDT ----- Please let her know her mri chest did not show any abnormalities ----- Message ----- From: Interface, Rad Results In Sent: 08/02/2024   7:22 PM EDT To: Annah JAYSON Skene, MD

## 2024-08-04 NOTE — Telephone Encounter (Signed)
 Per Dr. Melanee Please let her know her mri chest did not show any abnormalities.

## 2024-08-21 ENCOUNTER — Encounter

## 2024-08-21 ENCOUNTER — Other Ambulatory Visit

## 2024-08-21 ENCOUNTER — Ambulatory Visit: Admitting: Oncology

## 2024-09-12 ENCOUNTER — Encounter: Payer: Self-pay | Admitting: Oncology

## 2024-09-19 ENCOUNTER — Inpatient Hospital Stay: Attending: Oncology

## 2024-09-19 ENCOUNTER — Inpatient Hospital Stay

## 2024-09-19 ENCOUNTER — Other Ambulatory Visit: Payer: Self-pay

## 2024-09-19 DIAGNOSIS — Z170 Estrogen receptor positive status [ER+]: Secondary | ICD-10-CM

## 2024-09-19 DIAGNOSIS — Z853 Personal history of malignant neoplasm of breast: Secondary | ICD-10-CM | POA: Insufficient documentation

## 2024-09-19 LAB — CMP (CANCER CENTER ONLY)
ALT: 41 U/L (ref 0–44)
AST: 37 U/L (ref 15–41)
Albumin: 4.6 g/dL (ref 3.5–5.0)
Alkaline Phosphatase: 94 U/L (ref 38–126)
Anion gap: 11 (ref 5–15)
BUN: 13 mg/dL (ref 6–20)
CO2: 23 mmol/L (ref 22–32)
Calcium: 9.1 mg/dL (ref 8.9–10.3)
Chloride: 103 mmol/L (ref 98–111)
Creatinine: 0.83 mg/dL (ref 0.44–1.00)
GFR, Estimated: >60 mL/min (ref >60)
Glucose, Bld: 94 mg/dL (ref 70–99)
Potassium: 4.3 mmol/L (ref 3.5–5.1)
Sodium: 137 mmol/L (ref 135–145)
Total Bilirubin: 0.2 mg/dL (ref 0.0–1.2)
Total Protein: 7 g/dL (ref 6.5–8.1)

## 2024-09-19 LAB — CBC WITH DIFFERENTIAL (CANCER CENTER ONLY)
Abs Immature Granulocytes: 0.03 K/uL (ref 0.00–0.07)
Basophils Absolute: 0.1 K/uL (ref 0.0–0.1)
Basophils Relative: 1 %
Eosinophils Absolute: 0.5 K/uL (ref 0.0–0.5)
Eosinophils Relative: 5 %
HCT: 35.5 % — ABNORMAL LOW (ref 36.0–46.0)
Hemoglobin: 12.5 g/dL (ref 12.0–15.0)
Immature Granulocytes: 0 %
Lymphocytes Relative: 13 %
Lymphs Abs: 1.2 K/uL (ref 0.7–4.0)
MCH: 34.1 pg — ABNORMAL HIGH (ref 26.0–34.0)
MCHC: 35.2 g/dL (ref 30.0–36.0)
MCV: 96.7 fL (ref 80.0–100.0)
Monocytes Absolute: 0.7 K/uL (ref 0.1–1.0)
Monocytes Relative: 7 %
Neutro Abs: 6.9 K/uL (ref 1.7–7.7)
Neutrophils Relative %: 74 %
Platelet Count: 177 K/uL (ref 150–400)
RBC: 3.67 MIL/uL — ABNORMAL LOW (ref 3.87–5.11)
RDW: 12.3 % (ref 11.5–15.5)
WBC Count: 9.3 K/uL (ref 4.0–10.5)
nRBC: 0 % (ref 0.0–0.2)

## 2024-09-25 ENCOUNTER — Ambulatory Visit: Admission: RE | Admit: 2024-09-25 | Discharge: 2024-09-25 | Disposition: A | Discharge status: Home / Self Care

## 2024-12-18 ENCOUNTER — Ambulatory Visit: Admitting: Oncology

## 2024-12-18 ENCOUNTER — Other Ambulatory Visit

## 2024-12-18 ENCOUNTER — Encounter

## 8387-06-06 DEATH — deceased
